# Patient Record
Sex: Male | Born: 1935 | Race: Black or African American | Hispanic: No | Marital: Married | Smoking: Former smoker
Health system: Southern US, Community
[De-identification: ages and names within clinical notes are randomized; demographics above are authoritative.]

## PROBLEM LIST (undated history)

## (undated) DIAGNOSIS — E785 Hyperlipidemia, unspecified: Secondary | ICD-10-CM

## (undated) DIAGNOSIS — M1990 Unspecified osteoarthritis, unspecified site: Secondary | ICD-10-CM

## (undated) DIAGNOSIS — E119 Type 2 diabetes mellitus without complications: Secondary | ICD-10-CM

## (undated) DIAGNOSIS — N183 Chronic kidney disease, stage 3 unspecified: Secondary | ICD-10-CM

## (undated) DIAGNOSIS — I10 Essential (primary) hypertension: Secondary | ICD-10-CM

## (undated) DIAGNOSIS — H409 Unspecified glaucoma: Secondary | ICD-10-CM

## (undated) DIAGNOSIS — N184 Chronic kidney disease, stage 4 (severe): Secondary | ICD-10-CM

## (undated) DIAGNOSIS — G459 Transient cerebral ischemic attack, unspecified: Secondary | ICD-10-CM

## (undated) HISTORY — DX: Unspecified osteoarthritis, unspecified site: M19.90

## (undated) HISTORY — DX: Hyperlipidemia, unspecified: E78.5

## (undated) HISTORY — DX: Unspecified glaucoma: H40.9

---

## 1997-06-05 ENCOUNTER — Encounter: Admission: RE | Admit: 1997-06-05 | Discharge: 1997-06-05 | Payer: Self-pay | Admitting: Family Medicine

## 1997-07-08 ENCOUNTER — Encounter: Admission: RE | Admit: 1997-07-08 | Discharge: 1997-07-08 | Payer: Self-pay | Admitting: Family Medicine

## 1997-08-14 ENCOUNTER — Encounter: Admission: RE | Admit: 1997-08-14 | Discharge: 1997-08-14 | Payer: Self-pay | Admitting: Family Medicine

## 1997-08-21 ENCOUNTER — Encounter: Admission: RE | Admit: 1997-08-21 | Discharge: 1997-08-21 | Payer: Self-pay | Admitting: Family Medicine

## 1997-09-18 ENCOUNTER — Encounter: Admission: RE | Admit: 1997-09-18 | Discharge: 1997-09-18 | Payer: Self-pay | Admitting: Family Medicine

## 1997-09-21 ENCOUNTER — Encounter: Admission: RE | Admit: 1997-09-21 | Discharge: 1997-09-21 | Payer: Self-pay | Admitting: Family Medicine

## 1998-06-01 ENCOUNTER — Encounter: Admission: RE | Admit: 1998-06-01 | Discharge: 1998-06-01 | Payer: Self-pay | Admitting: Sports Medicine

## 1998-08-06 ENCOUNTER — Encounter: Admission: RE | Admit: 1998-08-06 | Discharge: 1998-08-06 | Payer: Self-pay | Admitting: Family Medicine

## 1998-12-27 ENCOUNTER — Encounter: Admission: RE | Admit: 1998-12-27 | Discharge: 1998-12-27 | Payer: Self-pay | Admitting: Family Medicine

## 1999-04-18 ENCOUNTER — Encounter: Admission: RE | Admit: 1999-04-18 | Discharge: 1999-04-18 | Payer: Self-pay | Admitting: Family Medicine

## 1999-05-23 ENCOUNTER — Encounter: Admission: RE | Admit: 1999-05-23 | Discharge: 1999-05-23 | Payer: Self-pay | Admitting: Family Medicine

## 1999-08-31 ENCOUNTER — Encounter: Admission: RE | Admit: 1999-08-31 | Discharge: 1999-08-31 | Payer: Self-pay | Admitting: Family Medicine

## 1999-12-29 ENCOUNTER — Encounter: Admission: RE | Admit: 1999-12-29 | Discharge: 1999-12-29 | Payer: Self-pay | Admitting: Family Medicine

## 2000-03-19 ENCOUNTER — Encounter: Admission: RE | Admit: 2000-03-19 | Discharge: 2000-03-19 | Payer: Self-pay | Admitting: Family Medicine

## 2000-06-11 ENCOUNTER — Encounter: Admission: RE | Admit: 2000-06-11 | Discharge: 2000-06-11 | Payer: Self-pay | Admitting: Family Medicine

## 2000-11-12 ENCOUNTER — Encounter: Admission: RE | Admit: 2000-11-12 | Discharge: 2000-11-12 | Payer: Self-pay | Admitting: Family Medicine

## 2001-02-18 ENCOUNTER — Encounter: Admission: RE | Admit: 2001-02-18 | Discharge: 2001-02-18 | Payer: Self-pay | Admitting: Family Medicine

## 2001-06-07 ENCOUNTER — Encounter: Admission: RE | Admit: 2001-06-07 | Discharge: 2001-06-07 | Payer: Self-pay | Admitting: Family Medicine

## 2001-06-17 ENCOUNTER — Encounter: Admission: RE | Admit: 2001-06-17 | Discharge: 2001-06-17 | Payer: Self-pay | Admitting: Family Medicine

## 2001-06-21 ENCOUNTER — Encounter: Admission: RE | Admit: 2001-06-21 | Discharge: 2001-09-19 | Payer: Self-pay | Admitting: *Deleted

## 2001-06-24 ENCOUNTER — Encounter: Admission: RE | Admit: 2001-06-24 | Discharge: 2001-06-24 | Payer: Self-pay | Admitting: Family Medicine

## 2001-07-09 ENCOUNTER — Encounter: Admission: RE | Admit: 2001-07-09 | Discharge: 2001-07-09 | Payer: Self-pay | Admitting: Family Medicine

## 2006-03-08 DIAGNOSIS — I10 Essential (primary) hypertension: Secondary | ICD-10-CM

## 2006-03-08 DIAGNOSIS — E119 Type 2 diabetes mellitus without complications: Secondary | ICD-10-CM | POA: Insufficient documentation

## 2006-03-08 DIAGNOSIS — E785 Hyperlipidemia, unspecified: Secondary | ICD-10-CM | POA: Insufficient documentation

## 2006-03-08 DIAGNOSIS — R809 Proteinuria, unspecified: Secondary | ICD-10-CM

## 2006-03-08 DIAGNOSIS — N529 Male erectile dysfunction, unspecified: Secondary | ICD-10-CM

## 2007-01-26 ENCOUNTER — Emergency Department (HOSPITAL_COMMUNITY): Admission: EM | Admit: 2007-01-26 | Discharge: 2007-01-26 | Payer: Self-pay | Admitting: Emergency Medicine

## 2010-09-30 LAB — I-STAT 8, (EC8 V) (CONVERTED LAB)
BUN: 24 — ABNORMAL HIGH
Bicarbonate: 25.9 — ABNORMAL HIGH
Chloride: 103
Glucose, Bld: 159 — ABNORMAL HIGH
Glucose, Bld: 365 — ABNORMAL HIGH
HCT: 39
HCT: 43
Hemoglobin: 14.6
Operator id: 294511
Potassium: 3.6
Potassium: 4.3
Sodium: 136
Sodium: 140
TCO2: 27

## 2012-06-02 ENCOUNTER — Encounter (HOSPITAL_COMMUNITY): Payer: Self-pay | Admitting: *Deleted

## 2012-06-02 ENCOUNTER — Emergency Department (HOSPITAL_COMMUNITY)
Admission: EM | Admit: 2012-06-02 | Discharge: 2012-06-02 | Disposition: A | Discharge status: Home / Self Care | Payer: Medicare Other | Attending: Emergency Medicine | Admitting: Emergency Medicine

## 2012-06-02 ENCOUNTER — Emergency Department (HOSPITAL_COMMUNITY): Payer: Medicare Other

## 2012-06-02 DIAGNOSIS — M19019 Primary osteoarthritis, unspecified shoulder: Secondary | ICD-10-CM | POA: Insufficient documentation

## 2012-06-02 DIAGNOSIS — M1990 Unspecified osteoarthritis, unspecified site: Secondary | ICD-10-CM

## 2012-06-02 DIAGNOSIS — N289 Disorder of kidney and ureter, unspecified: Secondary | ICD-10-CM | POA: Insufficient documentation

## 2012-06-02 DIAGNOSIS — M542 Cervicalgia: Secondary | ICD-10-CM | POA: Insufficient documentation

## 2012-06-02 DIAGNOSIS — E119 Type 2 diabetes mellitus without complications: Secondary | ICD-10-CM | POA: Insufficient documentation

## 2012-06-02 DIAGNOSIS — Z79899 Other long term (current) drug therapy: Secondary | ICD-10-CM | POA: Insufficient documentation

## 2012-06-02 HISTORY — DX: Type 2 diabetes mellitus without complications: E11.9

## 2012-06-02 LAB — POCT I-STAT, CHEM 8
BUN: 29 mg/dL — ABNORMAL HIGH (ref 6–23)
Calcium, Ion: 1.15 mmol/L (ref 1.13–1.30)
Creatinine, Ser: 1.7 mg/dL — ABNORMAL HIGH (ref 0.50–1.35)
Hemoglobin: 11.9 g/dL — ABNORMAL LOW (ref 13.0–17.0)
Sodium: 140 mEq/L (ref 135–145)
TCO2: 25 mmol/L (ref 0–100)

## 2012-06-02 LAB — POCT I-STAT TROPONIN I: Troponin i, poc: 0.01 ng/mL (ref 0.00–0.08)

## 2012-06-02 MED ORDER — TRAMADOL HCL 50 MG PO TABS: 50.0000 mg | ORAL_TABLET | Freq: Four times a day (QID) | ORAL | Status: DC | PRN

## 2012-06-02 NOTE — ED Provider Notes (Signed)
History     CSN: [id number]  Arrival date & time 06/02/12  [id number]   First MD Initiated Contact with Patient 06/02/12 1116      Chief Complaint  Patient presents with  . Shoulder Pain  . Neck Pain     HPI Pt reports right neck tenderness and right shoulder pain for several days, became more severe last night and it kept him up all night. Denies injury to shoulder.  Pain is been present for several weeks has gotten slowly worse.  Patient had trouble sleeping last night because the pain.  Patient did take 2 Aleve prior to going to bed last.  Certain movements make the pain worse.  No known direct trauma.  Patient denies shortness of breath, chest pain, diaphoresis, nausea, vomiting.  Past Medical History  Diagnosis Date  . Diabetes mellitus without complication     History reviewed. No pertinent past surgical history.  History reviewed. No pertinent family history.  History  Substance Use Topics  . Smoking status: Not on file  . Smokeless tobacco: Not on file  . Alcohol Use: No      Review of Systems All other systems reviewed and are negative Allergies  Review of patient's allergies indicates no known allergies.  Home Medications   Current Outpatient Rx  Name  Route  Sig  Dispense  Refill  . glimepiride (AMARYL) 4 MG tablet   Oral   Take 4 mg by mouth daily before breakfast.         . losartan-hydrochlorothiazide (HYZAAR) 100-12.5 MG per tablet   Oral   Take 1 tablet by mouth daily.         . metFORMIN (GLUCOPHAGE) 1000 MG tablet   Oral   Take 1,000 mg by mouth 2 (two) times daily with a meal.         . pravastatin (PRAVACHOL) 40 MG tablet   Oral   Take 40 mg by mouth daily.         . traMADol (ULTRAM) 50 MG tablet   Oral   Take 1 tablet (50 mg total) by mouth every 6 (six) hours as needed for pain.   30 tablet   0     BP 158/79  Pulse 93  Temp(Src) 98.1 F (36.7 C) (Oral)  Resp 19  SpO2 100%  Physical Exam  Nursing note and vitals  reviewed. Constitutional: He is oriented to person, place, and time. He appears well-developed and well-nourished. No distress.  HENT:  Head: Normocephalic and atraumatic.  Eyes: Pupils are equal, round, and reactive to light.  Neck: Normal range of motion.  Cardiovascular: Normal rate and intact distal pulses.   Pulmonary/Chest: No respiratory distress.  Abdominal: Normal appearance. He exhibits no distension.  Musculoskeletal:       Right shoulder: He exhibits tenderness. He exhibits normal range of motion, no bony tenderness, no swelling, no effusion and no crepitus.       Arms: Neurological: He is alert and oriented to person, place, and time. No cranial nerve deficit.  Skin: Skin is warm and dry. No rash noted.  Psychiatric: He has a normal mood and affect. His behavior is normal.    ED Course  Procedures (including critical care time)  Date: 06/02/2012  Rate: 93  Rhythm: normal sinus rhythm  QRS Axis: normal  Intervals: normal  ST/T Wave abnormalities: Nonspecific changes  Conduction Disutrbances: none  Narrative Interpretation: Nonspecific EKG     Labs Reviewed  POCT I-STAT, CHEM 8 -  Abnormal; Notable for the following:    BUN 29 (*)    Creatinine, Ser 1.70 (*)    Glucose, Bld 219 (*)    Hemoglobin 11.9 (*)    HCT 35.0 (*)    All other components within normal limits  POCT I-STAT TROPONIN I   Dg Shoulder Right  06/02/2012   *RADIOLOGY REPORT*  Clinical Data: Right shoulder pain.  No reported injury.  RIGHT SHOULDER - 2+ VIEW  Comparison: None.  Findings: Mild inferior glenohumeral spur formation.  No fracture or dislocation.  IMPRESSION: Mild degenerative changes.   Original Report Authenticated By: Claudie Revering, M.D.     1. Degenerative joint disease   2. Renal insufficiency   3. Diabetes       MDM   Patient was instructed to avoid NSAIDs until cleared by his primary care Dr.     Dot Lanes, MD 06/02/12 1239

## 2012-06-02 NOTE — ED Notes (Addendum)
Pain states pain inc with "certain movement." Pt currently has full ROM at this time. Pt states pain has been going on for a couple weeks and has progressed over time.

## 2012-06-02 NOTE — ED Notes (Signed)
Pt reports right neck tenderness and right shoulder pain for several days, became more severe last night and it kept him up all night. Denies injury to shoulder. No distress noted at triage, ekg being done.

## 2012-06-11 ENCOUNTER — Emergency Department (HOSPITAL_COMMUNITY)
Admission: EM | Admit: 2012-06-11 | Discharge: 2012-06-11 | Disposition: A | Discharge status: Home / Self Care | Payer: Medicare Other | Attending: Emergency Medicine | Admitting: Emergency Medicine

## 2012-06-11 ENCOUNTER — Encounter (HOSPITAL_COMMUNITY): Payer: Self-pay | Admitting: Emergency Medicine

## 2012-06-11 ENCOUNTER — Emergency Department (HOSPITAL_COMMUNITY): Payer: Medicare Other

## 2012-06-11 DIAGNOSIS — Z79899 Other long term (current) drug therapy: Secondary | ICD-10-CM | POA: Insufficient documentation

## 2012-06-11 DIAGNOSIS — R002 Palpitations: Secondary | ICD-10-CM | POA: Insufficient documentation

## 2012-06-11 DIAGNOSIS — I10 Essential (primary) hypertension: Secondary | ICD-10-CM

## 2012-06-11 DIAGNOSIS — R3589 Other polyuria: Secondary | ICD-10-CM | POA: Insufficient documentation

## 2012-06-11 DIAGNOSIS — R358 Other polyuria: Secondary | ICD-10-CM

## 2012-06-11 DIAGNOSIS — E119 Type 2 diabetes mellitus without complications: Secondary | ICD-10-CM | POA: Insufficient documentation

## 2012-06-11 DIAGNOSIS — R42 Dizziness and giddiness: Secondary | ICD-10-CM | POA: Insufficient documentation

## 2012-06-11 HISTORY — DX: Essential (primary) hypertension: I10

## 2012-06-11 LAB — BASIC METABOLIC PANEL
BUN: 25 mg/dL — ABNORMAL HIGH (ref 6–23)
CO2: 20 mEq/L (ref 19–32)
Calcium: 9.3 mg/dL (ref 8.4–10.5)
Chloride: 106 mEq/L (ref 96–112)
Creatinine, Ser: 1.75 mg/dL — ABNORMAL HIGH (ref 0.50–1.35)

## 2012-06-11 LAB — CBC WITH DIFFERENTIAL/PLATELET
Basophils Relative: 0 % (ref 0–1)
Eosinophils Absolute: 0.1 10*3/uL (ref 0.0–0.7)
MCH: 29.8 pg (ref 26.0–34.0)
MCHC: 34.7 g/dL (ref 30.0–36.0)
MCV: 86 fL (ref 78.0–100.0)
Monocytes Absolute: 0.6 10*3/uL (ref 0.1–1.0)
Monocytes Relative: 8 % (ref 3–12)
Neutro Abs: 4.2 10*3/uL (ref 1.7–7.7)
RBC: 4.06 MIL/uL — ABNORMAL LOW (ref 4.22–5.81)
WBC: 7 10*3/uL (ref 4.0–10.5)

## 2012-06-11 LAB — URINALYSIS, ROUTINE W REFLEX MICROSCOPIC
Bilirubin Urine: NEGATIVE
Glucose, UA: NEGATIVE mg/dL
Hgb urine dipstick: NEGATIVE
Specific Gravity, Urine: 1.019 (ref 1.005–1.030)

## 2012-06-11 LAB — URINE MICROSCOPIC-ADD ON

## 2012-06-11 MED ORDER — NITROGLYCERIN 0.4 MG SL SUBL: 0.4000 mg | SUBLINGUAL_TABLET | SUBLINGUAL | Status: DC | PRN

## 2012-06-11 MED ORDER — ASPIRIN 325 MG PO TABS
325.0000 mg | ORAL_TABLET | ORAL | Status: AC
  Administered 2012-06-11: 325 mg via ORAL
  Filled 2012-06-11: qty 1

## 2012-06-11 NOTE — ED Notes (Signed)
Called laboratory and was told a lavender top had never been sent down and needs a new lavender tube to be sent down.

## 2012-06-11 NOTE — ED Notes (Signed)
Called phlebotomy and notified them that CBC needed to be redrawn. Phlebotomy states they have the tube and need an order to be sent down with tube. Dr Christy Gentles notified of situation and new order has been placed

## 2012-06-11 NOTE — ED Provider Notes (Signed)
History     CSN: [id number]  Arrival date & time 06/11/12  99   First MD Initiated Contact with Patient 06/11/12 1548      Chief Complaint  Patient presents with  . Palpitations  . Dizziness     HPI Dizziness Onset - several weeks ago Course - stable Worsened by - walking Improved with rest   Pt reports he ate barbecue yesterday This morning when he woke up he felt "off balance" though he admits he has had this for weeks He reports he feels his heart racing and felt dizzy this morning.  No falls No syncope No CP No abd pain No vomiting/diarrhea No focal weakness No vertiginous symptoms  No HA   Past Medical History  Diagnosis Date  . Diabetes mellitus without complication   . Hypertension     History reviewed. No pertinent past surgical history.  History reviewed. No pertinent family history.  History  Substance Use Topics  . Smoking status: Not on file  . Smokeless tobacco: Not on file  . Alcohol Use: No      Review of Systems  Constitutional: Negative for fever.  Respiratory: Negative for shortness of breath.   Cardiovascular: Negative for chest pain.  Gastrointestinal: Negative for vomiting.  Neurological: Positive for dizziness. Negative for headaches.  All other systems reviewed and are negative.    Allergies  Review of patient's allergies indicates no known allergies.  Home Medications   Current Outpatient Rx  Name  Route  Sig  Dispense  Refill  . glimepiride (AMARYL) 4 MG tablet   Oral   Take 4 mg by mouth daily before breakfast.         . losartan-hydrochlorothiazide (HYZAAR) 100-12.5 MG per tablet   Oral   Take 1 tablet by mouth daily.         . metFORMIN (GLUCOPHAGE) 1000 MG tablet   Oral   Take 1,000 mg by mouth 2 (two) times daily with a meal.         . pravastatin (PRAVACHOL) 40 MG tablet   Oral   Take 40 mg by mouth daily.         . traMADol (ULTRAM) 50 MG tablet   Oral   Take 1 tablet (50 mg total) by  mouth every 6 (six) hours as needed for pain.   30 tablet   0     BP 163/83  Pulse 123  Temp(Src) 98.3 F (36.8 C) (Oral)  Resp 18  SpO2 99% BP 182/97  Pulse 118  Temp(Src) 98.5 F (36.9 C) (Oral)  Resp 18  SpO2 98%  Physical Exam CONSTITUTIONAL: Well developed/well nourished HEAD: Normocephalic/atraumatic EYES: EOMI/PERRL ENMT: Mucous membranes moist NECK: supple no meningeal signs SPINE:entire spine nontender CV: tachycardic S1/S2 noted, no murmurs/rubs/gallops noted LUNGS: Lungs are clear to auscultation bilaterally, no apparent distress ABDOMEN: soft, nontender, no rebound or guarding GU:no cva tenderness NEURO: Pt is awake/alert, moves all extremitiesx4 No facial droop No arm/leg drift No ataxia noted Steady gait noted No tremor noted EXTREMITIES: pulses normal, full ROM SKIN: warm, color normal PSYCH: no abnormalities of mood noted  ED Course  Procedures  Labs Reviewed  BASIC METABOLIC PANEL - Abnormal; Notable for the following:    Glucose, Bld 140 (*)    BUN 25 (*)    Creatinine, Ser 1.75 (*)    GFR calc non Af Amer 35 (*)    GFR calc Af Amer 41 (*)    All other components within normal  limits  CBC  POCT I-STAT TROPONIN I  6:35 PM Pt observed for several hrs.  He is well appearing.  No ataxia.  No focal weakness He ambulated without difficulty He denies cp/sob/abdominal pain He does report polyuria but reports his PCP told him his prostate is "fine"  He reports he gets up frequently with urinating No signs of uti, but may need flomax He may also need better BP control - his PCP cut back his hyzaar recently and his BP is still elevated.  He will see his PCP this week and I have asked him to record daily BP reading to take back to his PCP  He is well appearing, watching TV, no distress I doubt ACS/CVA or other acute emergent process.  No signs of hypertensive emergency His tachycardia has improved as well All questions answered, patient/family feel  comfortable with plan   MDM  Nursing notes including past medical history and social history reviewed and considered in documentation xrays reviewed and considered Labs/vital reviewed and considered Previous records reviewed and considered - recent ED visit reviewed        Date: 06/11/2012  Rate: 122  Rhythm: sinus tachycardia  QRS Axis: normal  Intervals: normal  ST/T Wave abnormalities: nonspecific ST changes  Conduction Disutrbances:none  Narrative Interpretation:   Old EKG Reviewed: changes noted as rate is faster today    Sharyon Cable, MD 06/11/12 [phone]

## 2012-06-11 NOTE — ED Notes (Signed)
Pt reports feeling off balance for the last several weeks. Today upon waking felt dizzy was diaphoretic and felt like his heart was racing. Denies chest pain. Pt alert, oriented x4. NAD

## 2013-03-24 ENCOUNTER — Encounter (INDEPENDENT_AMBULATORY_CARE_PROVIDER_SITE_OTHER): Payer: Self-pay | Admitting: General Surgery

## 2013-03-27 ENCOUNTER — Ambulatory Visit (INDEPENDENT_AMBULATORY_CARE_PROVIDER_SITE_OTHER): Payer: Medicare Other | Admitting: General Surgery

## 2013-04-01 ENCOUNTER — Encounter (INDEPENDENT_AMBULATORY_CARE_PROVIDER_SITE_OTHER): Payer: Self-pay | Admitting: General Surgery

## 2013-05-14 ENCOUNTER — Ambulatory Visit (INDEPENDENT_AMBULATORY_CARE_PROVIDER_SITE_OTHER): Payer: Medicare Other | Admitting: General Surgery

## 2013-05-15 ENCOUNTER — Encounter (INDEPENDENT_AMBULATORY_CARE_PROVIDER_SITE_OTHER): Payer: Self-pay | Admitting: General Surgery

## 2013-05-15 ENCOUNTER — Ambulatory Visit (INDEPENDENT_AMBULATORY_CARE_PROVIDER_SITE_OTHER): Payer: Medicare Other | Admitting: General Surgery

## 2013-05-15 VITALS — BP 130/80 | HR 88 | Temp 97.3°F | Resp 14 | Ht 65.0 in | Wt 213.4 lb

## 2013-05-15 DIAGNOSIS — K4090 Unilateral inguinal hernia, without obstruction or gangrene, not specified as recurrent: Secondary | ICD-10-CM | POA: Insufficient documentation

## 2013-05-15 NOTE — Progress Notes (Signed)
Patient ID: Justin Holloway, male   DOB: 26-Sep-1929, 80 y.o.   MRN: [id number]  Chief Complaint  Patient presents with  . New Evaluation    eval LIH    HPI Justin Holloway is a 80 y.o. male.  He is referred by Dr. Christia Holloway evaluation of left internal hernia. Dr. Heath Holloway is his PCP.  The patient states that he has had a swelling in his left groin since he was a young man. It has been getting larger this last year and has extended well down into the scrotum. No prior history of any hernia surgery. He says he has no pain. His wife is with him and agrees that has gotten much larger.  No history of any prior surgery. Non-insulin-dependent diabetes. Hypertension. Hyperlipidemia.  HPI  Past Medical History  Diagnosis Date  . Diabetes mellitus without complication   . Hypertension   . Arthritis   . Hyperlipidemia     History reviewed. No pertinent past surgical history.  Family History  Problem Relation Age of Onset  . Cancer Mother 70    breast  . COPD Father 95    Congestive heart failure    Social History History  Substance Use Topics  . Smoking status: Never Smoker   . Smokeless tobacco: Not on file  . Alcohol Use: No    No Known Allergies  Current Outpatient Prescriptions  Medication Sig Dispense Refill  . Albiglutide (TANZEUM) 30 MG PEN Inject into the skin.      [name] glimepiride (AMARYL) 4 MG tablet Take 4 mg by mouth daily before breakfast.      . losartan-hydrochlorothiazide (HYZAAR) 100-12.5 MG per tablet Take 1 tablet by mouth daily.      . metFORMIN (GLUCOPHAGE) 1000 MG tablet Take 1,000 mg by mouth 2 (two) times daily with a meal.      . pravastatin (PRAVACHOL) 40 MG tablet Take 40 mg by mouth daily.      . traMADol (ULTRAM) 50 MG tablet Take 1 tablet (50 mg total) by mouth every 6 (six) hours as needed for pain.  30 tablet  0   No current facility-administered medications for this visit.    Review of Systems Review of Systems  Constitutional:  Negative for fever, chills and unexpected weight change.  HENT: Negative for congestion, hearing loss, sore throat, trouble swallowing and voice change.   Eyes: Negative for visual disturbance.  Respiratory: Negative for cough and wheezing.   Cardiovascular: Negative for chest pain, palpitations and leg swelling.  Gastrointestinal: Negative for nausea, vomiting, abdominal pain, diarrhea, constipation, blood in stool, abdominal distention, anal bleeding and rectal pain.  Genitourinary: Positive for scrotal swelling. Negative for hematuria and difficulty urinating.  Musculoskeletal: Negative for arthralgias.  Skin: Negative for rash and wound.  Neurological: Negative for seizures, syncope, weakness and headaches.  Hematological: Negative for adenopathy. Does not bruise/bleed easily.  Psychiatric/Behavioral: Negative for confusion.    Blood pressure 130/80, pulse 88, temperature 97.3 F (36.3 C), resp. rate 14, height 5\' 5"  (1.651 m), weight 213 lb 6.4 oz (96.798 kg).  Physical Exam Physical Exam  Constitutional: He is oriented to person, place, and time. He appears well-developed and well-nourished. No distress.  HENT:  Head: Normocephalic.  Nose: Nose normal.  Mouth/Throat: No oropharyngeal exudate.  Eyes: Conjunctivae and EOM are normal. Pupils are equal, round, and reactive to light. Right eye exhibits no discharge. Left eye exhibits no discharge. No scleral icterus.  Neck: Normal range of motion. Neck  supple. No JVD present. No tracheal deviation present. No thyromegaly present.  Cardiovascular: Normal rate, regular rhythm, normal heart sounds and intact distal pulses.   No murmur heard. Pulmonary/Chest: Effort normal and breath sounds normal. No stridor. No respiratory distress. He has no wheezes. He has no rales. He exhibits no tenderness.  Abdominal: Soft. Bowel sounds are normal. He exhibits no distension and no mass. There is no tenderness. There is no rebound and no guarding.   Umbilical hernia, easily reducible, nontender  Genitourinary:  Very large left inguinal hernia extending down into the scrotum. I was able to reduce this when he is supine but it took some time and some effort. No evidence of hernia on the right.  Musculoskeletal: Normal range of motion. He exhibits no edema and no tenderness.  Lymphadenopathy:    He has no cervical adenopathy.  Neurological: He is alert and oriented to person, place, and time. He has normal reflexes. Coordination normal.  Skin: Skin is warm and dry. No rash noted. He is not diaphoretic. No erythema. No pallor.  Psychiatric: He has a normal mood and affect. His behavior is normal. Judgment and thought content normal.    Data Reviewed Dr. Cy Holloway office notes  Assessment    Large, long-standing indirect left inguinal hernia. Minimally symptomatic but enlarging and at risk for incarceration and strangulation  Diabetes mellitus  Hypertension  Hyperlipidemia  Stable health status was good performance status     Plan    I discussed the anatomy and natural history of his hernia. I offered to repair this electively and he wishes to have that done  We will reschedule for elective repair of his very large left inguinal hernia with mesh in the near future  I discussed the indications, details, techniques, and numerous risks of the surgery with him and his wife.  He's aware of the risk of bleeding, infection, recurrence, nerve damage with chronic pain, injury to the bladder or intestine which is rare, and other unforeseen problems. He understands all these issues and all his questions were answered. He agrees with this plan.        Justin Holloway 05/15/2013, 12:38 PM

## 2013-05-15 NOTE — Patient Instructions (Signed)
You have a very large left inguinal hernia. Fortunately, it is reducible with some effort.  We have talked about the natural history of this in the future and the risks and complications that might occur.  You had decided to go ahead with surgery.  You will be scheduled for open repair of the Left inguinal hernia with mesh in the near future.    Inguinal Hernia, Adult Muscles help keep everything in the body in its proper place. But if a weak spot in the muscles develops, something can poke through. That is called a hernia. When this happens in the lower part of the belly (abdomen), it is called an inguinal hernia. (It takes its name from a part of the body in this region called the inguinal canal.) A weak spot in the wall of muscles lets some fat or part of the small intestine bulge through. An inguinal hernia can develop at any age. Men get them more often than women. CAUSES  In adults, an inguinal hernia develops over time.  It can be triggered by:  Suddenly straining the muscles of the lower abdomen.  Lifting heavy objects.  Straining to have a bowel movement. Difficult bowel movements (constipation) can lead to this.  Constant coughing. This may be caused by smoking or lung disease.  Being overweight.  Being pregnant.  Working at a job that requires long periods of standing or heavy lifting.  Having had an inguinal hernia before. One type can be an emergency situation. It is called a strangulated inguinal hernia. It develops if part of the small intestine slips through the weak spot and cannot get back into the abdomen. The blood supply can be cut off. If that happens, part of the intestine may die. This situation requires emergency surgery. SYMPTOMS  Often, a small inguinal hernia has no symptoms. It is found when a healthcare provider does a physical exam. Larger hernias usually have symptoms.   In adults, symptoms may include:  A lump in the groin. This is easier to see  when the person is standing. It might disappear when lying down.  In men, a lump in the scrotum.  Pain or burning in the groin. This occurs especially when lifting, straining or coughing.  A dull ache or feeling of pressure in the groin.  Signs of a strangulated hernia can include:  A bulge in the groin that becomes very painful and tender to the touch.  A bulge that turns red or purple.  Fever, nausea and vomiting.  Inability to have a bowel movement or to pass gas. DIAGNOSIS  To decide if you have an inguinal hernia, a healthcare provider will probably do a physical examination.  This will include asking questions about any symptoms you have noticed.  The healthcare provider might feel the groin area and ask you to cough. If an inguinal hernia is felt, the healthcare provider may try to slide it back into the abdomen.  Usually no other tests are needed. TREATMENT  Treatments can vary. The size of the hernia makes a difference. Options include:  Watchful waiting. This is often suggested if the hernia is small and you have had no symptoms.  No medical procedure will be done unless symptoms develop.  You will need to watch closely for symptoms. If any occur, contact your healthcare provider right away.  Surgery. This is used if the hernia is larger or you have symptoms.  Open surgery. This is usually an outpatient procedure (you will not stay  overnight in a hospital). An cut (incision) is made through the skin in the groin. The hernia is put back inside the abdomen. The weak area in the muscles is then repaired by herniorrhaphy or hernioplasty. Herniorrhaphy: in this type of surgery, the weak muscles are sewn back together. Hernioplasty: a patch or mesh is used to close the weak area in the abdominal wall.  Laparoscopy. In this procedure, a surgeon makes small incisions. A thin tube with a tiny video camera (called a laparoscope) is put into the abdomen. The surgeon repairs the  hernia with mesh by looking with the video camera and using two long instruments. HOME CARE INSTRUCTIONS   After surgery to repair an inguinal hernia:  You will need to take pain medicine prescribed by your healthcare provider. Follow all directions carefully.  You will need to take care of the wound from the incision.  Your activity will be restricted for awhile. This will probably include no heavy lifting for several weeks. You also should not do anything too active for a few weeks. When you can return to work will depend on the type of job that you have.  During "watchful waiting" periods, you should:  Maintain a healthy weight.  Eat a diet high in fiber (fruits, vegetables and whole grains).  Drink plenty of fluids to avoid constipation. This means drinking enough water and other liquids to keep your urine clear or pale yellow.  Do not lift heavy objects.  Do not stand for long periods of time.  Quit smoking. This should keep you from developing a frequent cough. SEEK MEDICAL CARE IF:   A bulge develops in your groin area.  You feel pain, a burning sensation or pressure in the groin. This might be worse if you are lifting or straining.  You develop a fever of more than 100.5 F (38.1 C). SEEK IMMEDIATE MEDICAL CARE IF:   Pain in the groin increases suddenly.  A bulge in the groin gets bigger suddenly and does not go down.  For men, there is sudden pain in the scrotum. Or, the size of the scrotum increases.  A bulge in the groin area becomes red or purple and is painful to touch.  You have nausea or vomiting that does not go away.  You feel your heart beating much faster than normal.  You cannot have a bowel movement or pass gas.  You develop a fever of more than 102.0 F (38.9 C). Document Released: 05/14/2008 Document Revised: 03/20/2011 Document Reviewed: 05/14/2008 [facility] Patient Information 2014 [place], [place].

## 2013-05-29 ENCOUNTER — Encounter (HOSPITAL_COMMUNITY): Payer: Self-pay | Admitting: Pharmacy Technician

## 2013-06-04 ENCOUNTER — Encounter (HOSPITAL_COMMUNITY)
Admission: RE | Admit: 2013-06-04 | Discharge: 2013-06-04 | Disposition: A | Discharge status: Home / Self Care | Payer: Medicare Other | Source: Ambulatory Visit | Attending: General Surgery | Admitting: General Surgery

## 2013-06-04 ENCOUNTER — Encounter (HOSPITAL_COMMUNITY): Payer: Self-pay

## 2013-06-04 DIAGNOSIS — E119 Type 2 diabetes mellitus without complications: Secondary | ICD-10-CM | POA: Insufficient documentation

## 2013-06-04 DIAGNOSIS — Z01812 Encounter for preprocedural laboratory examination: Secondary | ICD-10-CM | POA: Insufficient documentation

## 2013-06-04 DIAGNOSIS — I10 Essential (primary) hypertension: Secondary | ICD-10-CM | POA: Insufficient documentation

## 2013-06-04 DIAGNOSIS — Z01810 Encounter for preprocedural cardiovascular examination: Secondary | ICD-10-CM | POA: Insufficient documentation

## 2013-06-04 DIAGNOSIS — Z01818 Encounter for other preprocedural examination: Secondary | ICD-10-CM | POA: Insufficient documentation

## 2013-06-04 LAB — URINALYSIS, ROUTINE W REFLEX MICROSCOPIC
BILIRUBIN URINE: NEGATIVE
Glucose, UA: NEGATIVE mg/dL
HGB URINE DIPSTICK: NEGATIVE
KETONES UR: NEGATIVE mg/dL
Nitrite: NEGATIVE
PROTEIN: NEGATIVE mg/dL
Specific Gravity, Urine: 1.019 (ref 1.005–1.030)
UROBILINOGEN UA: 0.2 mg/dL (ref 0.0–1.0)
pH: 5 (ref 5.0–8.0)

## 2013-06-04 LAB — COMPREHENSIVE METABOLIC PANEL
ALT: 10 U/L (ref 0–53)
AST: 16 U/L (ref 0–37)
Albumin: 3.8 g/dL (ref 3.5–5.2)
Alkaline Phosphatase: 81 U/L (ref 39–117)
BILIRUBIN TOTAL: 0.3 mg/dL (ref 0.3–1.2)
BUN: 22 mg/dL (ref 6–23)
CHLORIDE: 107 meq/L (ref 96–112)
CO2: 23 meq/L (ref 19–32)
Calcium: 10.2 mg/dL (ref 8.4–10.5)
Creatinine, Ser: 1.66 mg/dL — ABNORMAL HIGH (ref 0.50–1.35)
GFR, EST AFRICAN AMERICAN: 43 mL/min — AB (ref >90)
GFR, EST NON AFRICAN AMERICAN: 37 mL/min — AB (ref >90)
GLUCOSE: 151 mg/dL — AB (ref 70–99)
Potassium: 5 mEq/L (ref 3.7–5.3)
SODIUM: 143 meq/L (ref 137–147)
Total Protein: 7.5 g/dL (ref 6.0–8.3)

## 2013-06-04 LAB — CBC WITH DIFFERENTIAL/PLATELET
BASOS ABS: 0 10*3/uL (ref 0.0–0.1)
Basophils Relative: 0 % (ref 0–1)
Eosinophils Absolute: 0.2 10*3/uL (ref 0.0–0.7)
Eosinophils Relative: 3 % (ref 0–5)
HEMATOCRIT: 37 % — AB (ref 39.0–52.0)
Hemoglobin: 12 g/dL — ABNORMAL LOW (ref 13.0–17.0)
LYMPHS ABS: 2 10*3/uL (ref 0.7–4.0)
LYMPHS PCT: 34 % (ref 12–46)
MCH: 29 pg (ref 26.0–34.0)
MCHC: 32.4 g/dL (ref 30.0–36.0)
MCV: 89.4 fL (ref 78.0–100.0)
MONO ABS: 0.5 10*3/uL (ref 0.1–1.0)
Monocytes Relative: 8 % (ref 3–12)
NEUTROS ABS: 3.2 10*3/uL (ref 1.7–7.7)
Neutrophils Relative %: 55 % (ref 43–77)
PLATELETS: 204 10*3/uL (ref 150–400)
RBC: 4.14 MIL/uL — AB (ref 4.22–5.81)
RDW: 13.1 % (ref 11.5–15.5)
WBC: 5.9 10*3/uL (ref 4.0–10.5)

## 2013-06-04 LAB — URINE MICROSCOPIC-ADD ON

## 2013-06-04 NOTE — Progress Notes (Signed)
06/04/13 1259  OBSTRUCTIVE SLEEP APNEA  Have you ever been diagnosed with sleep apnea through a sleep study? No  Do you snore loudly (loud enough to be heard through closed doors)?  1  Do you often feel tired, fatigued, or sleepy during the daytime? 1  Has anyone observed you stop breathing during your sleep? 1  Do you have, or are you being treated for high blood pressure? 1  BMI more than 35 kg/m2? 1  Age over 78 years old? 1  Neck circumference greater than 40 cm/16 inches? 1  Gender: 1  Obstructive Sleep Apnea Score 8  Score 4 or greater  Results sent to PCP

## 2013-06-04 NOTE — Pre-Procedure Instructions (Addendum)
Justin Holloway  06/04/2013   Your procedure is scheduled on:  Monday, June 1  Report to [facility] Short Stay Entrance A at 9:30 AM.  Call this number if you have problems the morning of surgery: [phone]   Remember: Discontinue asprin, coumadin, plavix, effient, and herbal medications   Do not eat food or drink liquids after midnight.   Take these medicines the morning of surgery with A SIP OF WATER: none   Do not wear jewelry, make-up or nail polish.  Do not wear lotions, powders, or perfumes. You may wear deodorant.  Do not shave 48 hours prior to surgery. Men may shave face and neck.  Do not bring valuables to the hospital.  [facility] is not responsible   for any belongings or valuables.               Contacts, dentures or bridgework may not be worn into surgery.  Leave suitcase in the car. After surgery it may be brought to your room.  For patients admitted to the hospital, discharge time is determined by your   treatment team.               Patients discharged the day of surgery will not be allowed to drive home.  Name and phone number of your driver:   Special Instructions: review preparing for surgery   Please read over the following fact sheets that you were given: Pain Booklet, Coughing and Deep Breathing and Surgical Site Infection Prevention

## 2013-06-04 NOTE — Progress Notes (Signed)
Cardiologist: Dr. Einar Gip. Will request notes/ekg/any cardiac studies.

## 2013-06-05 NOTE — H&P (Signed)
Justin Holloway   MRN:  [id number]   Description: 80 year old male  Provider: [name], MD  Department: [facility]               Diagnoses      Left inguinal hernia    -  Primary      550.90            Current Vitals Most recent update: 05/15/2013 11:13 AM by Elwyn Lade, CMA      BP Pulse Temp(Src) Resp Ht Wt      130/80 88 97.3 F (36.3 C) 14 5\' 5"  (1.651 m) 213 lb 6.4 oz (96.798 kg)      BMI 35.51 kg/m2                     History and Physical     [name], MD     Status: Signed            Patient ID: Justin Holloway, male   DOB: 01-07-1930, 80 y.o.   MRN: [id number]              HPI Justin Holloway is a 80 y.o. male.  He is referred by Dr. Rana Snare for evaluation of left inguinal  hernia. Dr. Heath Gold is his PCP.   The patient states that he has had a swelling in his left groin since he was a young man. It has been getting larger this last year and has extended well down into the scrotum. No prior history of any hernia surgery. He says he has no pain. His wife is with him and agrees that has gotten much larger.   No history of any prior surgery. Non-insulin-dependent diabetes. Hypertension. Hyperlipidemia.         Past Medical History   Diagnosis  Date   .  Diabetes mellitus without complication     .  Hypertension     .  Arthritis     .  Hyperlipidemia          History reviewed. No pertinent past surgical history.    Family History   Problem  Relation  Age of Onset   .  Cancer  Mother  66       breast   .  COPD  Father  28       Congestive heart failure        Social History History   Substance Use Topics   .  Smoking status:  Never Smoker    .  Smokeless tobacco:  Not on file   .  Alcohol Use:  No        No Known Allergies    Current Outpatient Prescriptions   Medication  Sig  Dispense  Refill   .  Albiglutide (TANZEUM) 30 MG PEN  Inject into the skin.         [name]  glimepiride (AMARYL) 4  MG tablet  Take 4 mg by mouth daily before breakfast.         .  losartan-hydrochlorothiazide (HYZAAR) 100-12.5 MG per tablet  Take 1 tablet by mouth daily.         .  metFORMIN (GLUCOPHAGE) 1000 MG tablet  Take 1,000 mg by mouth 2 (two) times daily with a meal.         .  pravastatin (PRAVACHOL) 40 MG tablet  Take 40 mg by mouth daily.         [name]  traMADol (ULTRAM) 50 MG tablet  Take 1 tablet (50 mg total) by mouth every 6 (six) hours as needed for pain.   30 tablet   0       No current facility-administered medications for this visit.        Review of Systems   Constitutional: Negative for fever, chills and unexpected weight change.  HENT: Negative for congestion, hearing loss, sore throat, trouble swallowing and voice change.   Eyes: Negative for visual disturbance.  Respiratory: Negative for cough and wheezing.   Cardiovascular: Negative for chest pain, palpitations and leg swelling.  Gastrointestinal: Negative for nausea, vomiting, abdominal pain, diarrhea, constipation, blood in stool, abdominal distention, anal bleeding and rectal pain.  Genitourinary: Positive for scrotal swelling. Negative for hematuria and difficulty urinating.  Musculoskeletal: Negative for arthralgias.  Skin: Negative for rash and wound.  Neurological: Negative for seizures, syncope, weakness and headaches.  Hematological: Negative for adenopathy. Does not bruise/bleed easily.  Psychiatric/Behavioral: Negative for confusion.      Blood pressure 130/80, pulse 88, temperature 97.3 F (36.3 C), resp. rate 14, height 5\' 5"  (1.651 m), weight 213 lb 6.4 oz (96.798 kg).   Physical Exam   Constitutional: He is oriented to person, place, and time. He appears well-developed and well-nourished. No distress.  HENT:   Head: Normocephalic.   Nose: Nose normal.   Mouth/Throat: No oropharyngeal exudate.  Eyes: Conjunctivae and EOM are normal. Pupils are equal, round, and reactive to light. Right eye exhibits no  discharge. Left eye exhibits no discharge. No scleral icterus.  Neck: Normal range of motion. Neck supple. No JVD present. No tracheal deviation present. No thyromegaly present.  Cardiovascular: Normal rate, regular rhythm, normal heart sounds and intact distal pulses.    No murmur heard. Pulmonary/Chest: Effort normal and breath sounds normal. No stridor. No respiratory distress. He has no wheezes. He has no rales. He exhibits no tenderness.  Abdominal: Soft. Bowel sounds are normal. He exhibits no distension and no mass. There is no tenderness. There is no rebound and no guarding.  Umbilical hernia, easily reducible, nontender  Genitourinary:  Very large left inguinal hernia extending down into the scrotum. I was able to reduce this when he is supine but it took some time and some effort. No evidence of hernia on the right.  Musculoskeletal: Normal range of motion. He exhibits no edema and no tenderness.  Lymphadenopathy:    He has no cervical adenopathy.  Neurological: He is alert and oriented to person, place, and time. He has normal reflexes. Coordination normal.  Skin: Skin is warm and dry. No rash noted. He is not diaphoretic. No erythema. No pallor.  Psychiatric: He has a normal mood and affect. His behavior is normal. Judgment and thought content normal.      Data Reviewed Dr. Cy Blamer office notes   Assessment    Large, long-standing indirect left inguinal hernia. Minimally symptomatic but enlarging and at risk for incarceration and strangulation   Diabetes mellitus   Hypertension   Hyperlipidemia   Stable health status was good performance status      Plan    I discussed the anatomy and natural history of his hernia. I offered to repair this electively and he wishes to have that done   We will reschedule for elective repair of his very large left inguinal hernia with mesh in the near future   I discussed the indications, details, techniques, and numerous  risks of the surgery with him and his wife.  He's aware of the risk of bleeding, infection, recurrence, nerve damage with chronic pain, injury to the bladder or intestine which is rare, and other unforeseen problems. He understands all these issues and all his questions were answered. He agrees with this plan.           Edsel Petrin. Dalbert Batman, M.D., [facility] General and Minimally invasive Surgery Breast and Colorectal Surgery Office:   [phone] Pager:   [phone]

## 2013-06-08 MED ORDER — CEFAZOLIN SODIUM-DEXTROSE 2-3 GM-% IV SOLR
2.0000 g | INTRAVENOUS | Status: AC
  Administered 2013-06-09: 2 g via INTRAVENOUS
  Filled 2013-06-08: qty 50

## 2013-06-09 ENCOUNTER — Ambulatory Visit (HOSPITAL_COMMUNITY): Payer: Medicare Other | Admitting: Certified Registered"

## 2013-06-09 ENCOUNTER — Encounter (HOSPITAL_COMMUNITY)
Admission: RE | Disposition: A | Discharge status: Home / Self Care | Payer: Self-pay | Source: Ambulatory Visit | Attending: General Surgery

## 2013-06-09 ENCOUNTER — Encounter (HOSPITAL_COMMUNITY): Payer: Self-pay | Admitting: *Deleted

## 2013-06-09 ENCOUNTER — Encounter (HOSPITAL_COMMUNITY): Payer: Medicare Other | Admitting: Certified Registered"

## 2013-06-09 ENCOUNTER — Ambulatory Visit (HOSPITAL_COMMUNITY)
Admission: RE | Admit: 2013-06-09 | Discharge: 2013-06-09 | Disposition: A | Discharge status: Home / Self Care | Payer: Medicare Other | Source: Ambulatory Visit | Attending: General Surgery | Admitting: General Surgery

## 2013-06-09 DIAGNOSIS — E785 Hyperlipidemia, unspecified: Secondary | ICD-10-CM | POA: Insufficient documentation

## 2013-06-09 DIAGNOSIS — K4030 Unilateral inguinal hernia, with obstruction, without gangrene, not specified as recurrent: Secondary | ICD-10-CM | POA: Insufficient documentation

## 2013-06-09 DIAGNOSIS — I10 Essential (primary) hypertension: Secondary | ICD-10-CM | POA: Insufficient documentation

## 2013-06-09 DIAGNOSIS — Z79899 Other long term (current) drug therapy: Secondary | ICD-10-CM | POA: Insufficient documentation

## 2013-06-09 DIAGNOSIS — E119 Type 2 diabetes mellitus without complications: Secondary | ICD-10-CM | POA: Insufficient documentation

## 2013-06-09 DIAGNOSIS — M129 Arthropathy, unspecified: Secondary | ICD-10-CM | POA: Insufficient documentation

## 2013-06-09 DIAGNOSIS — K4090 Unilateral inguinal hernia, without obstruction or gangrene, not specified as recurrent: Secondary | ICD-10-CM | POA: Diagnosis present

## 2013-06-09 HISTORY — PX: INGUINAL HERNIA REPAIR: SHX194

## 2013-06-09 HISTORY — PX: INSERTION OF MESH: SHX5868

## 2013-06-09 LAB — GLUCOSE, CAPILLARY
Glucose-Capillary: 111 mg/dL — ABNORMAL HIGH (ref 70–99)
Glucose-Capillary: 96 mg/dL (ref 70–99)

## 2013-06-09 SURGERY — REPAIR, HERNIA, INGUINAL, ADULT
Anesthesia: General | Site: Groin | Laterality: Left

## 2013-06-09 MED ORDER — CHLORHEXIDINE GLUCONATE 4 % EX LIQD
1.0000 "application " | Freq: Once | CUTANEOUS | Status: DC
  Filled 2013-06-09: qty 15

## 2013-06-09 MED ORDER — FENTANYL CITRATE 0.05 MG/ML IJ SOLN
INTRAMUSCULAR | Status: AC
  Administered 2013-06-09: 100 ug via INTRAVENOUS
  Filled 2013-06-09: qty 2

## 2013-06-09 MED ORDER — LACTATED RINGERS IV SOLN
INTRAVENOUS | Status: DC
  Administered 2013-06-09: 10:00:00 via INTRAVENOUS

## 2013-06-09 MED ORDER — NEOSTIGMINE METHYLSULFATE 10 MG/10ML IV SOLN
INTRAVENOUS | Status: AC
  Filled 2013-06-09: qty 1

## 2013-06-09 MED ORDER — ONDANSETRON HCL 4 MG/2ML IJ SOLN: 4.0000 mg | Freq: Once | INTRAMUSCULAR | Status: DC | PRN

## 2013-06-09 MED ORDER — ROCURONIUM BROMIDE 100 MG/10ML IV SOLN
INTRAVENOUS | Status: DC | PRN
Start: 2013-06-09 — End: 2013-06-09
  Administered 2013-06-09: 40 mg via INTRAVENOUS
  Administered 2013-06-09: 5 mg via INTRAVENOUS

## 2013-06-09 MED ORDER — OXYCODONE-ACETAMINOPHEN 7.5-325 MG PO TABS: 1.0000 | ORAL_TABLET | ORAL | Status: DC | PRN

## 2013-06-09 MED ORDER — GLYCOPYRROLATE 0.2 MG/ML IJ SOLN
INTRAMUSCULAR | Status: AC
  Filled 2013-06-09: qty 3

## 2013-06-09 MED ORDER — FENTANYL CITRATE 0.05 MG/ML IJ SOLN
50.0000 ug | INTRAMUSCULAR | Status: DC | PRN
  Administered 2013-06-09: 100 ug via INTRAVENOUS

## 2013-06-09 MED ORDER — PROPOFOL 10 MG/ML IV BOLUS
INTRAVENOUS | Status: DC | PRN
  Administered 2013-06-09: 100 mg via INTRAVENOUS

## 2013-06-09 MED ORDER — FENTANYL CITRATE 0.05 MG/ML IJ SOLN
INTRAMUSCULAR | Status: DC | PRN
  Administered 2013-06-09: 100 ug via INTRAVENOUS

## 2013-06-09 MED ORDER — MIDAZOLAM HCL 2 MG/2ML IJ SOLN
INTRAMUSCULAR | Status: AC
  Administered 2013-06-09: 1 mg via INTRAVENOUS
  Filled 2013-06-09: qty 2

## 2013-06-09 MED ORDER — SODIUM CHLORIDE 0.9 % IV SOLN
INTRAVENOUS | Status: DC | PRN
  Administered 2013-06-09: 12:00:00 via INTRAVENOUS

## 2013-06-09 MED ORDER — ONDANSETRON HCL 4 MG/2ML IJ SOLN
INTRAMUSCULAR | Status: DC | PRN
  Administered 2013-06-09: 4 mg via INTRAVENOUS

## 2013-06-09 MED ORDER — LIDOCAINE HCL (CARDIAC) 20 MG/ML IV SOLN
INTRAVENOUS | Status: AC
  Filled 2013-06-09: qty 5

## 2013-06-09 MED ORDER — ROCURONIUM BROMIDE 50 MG/5ML IV SOLN
INTRAVENOUS | Status: AC
  Filled 2013-06-09: qty 1

## 2013-06-09 MED ORDER — BUPIVACAINE-EPINEPHRINE (PF) 0.5% -1:200000 IJ SOLN
INTRAMUSCULAR | Status: AC
  Filled 2013-06-09: qty 30

## 2013-06-09 MED ORDER — FENTANYL CITRATE 0.05 MG/ML IJ SOLN
25.0000 ug | INTRAMUSCULAR | Status: DC | PRN
  Administered 2013-06-09 (×3): 25 ug via INTRAVENOUS

## 2013-06-09 MED ORDER — PROPOFOL 10 MG/ML IV BOLUS
INTRAVENOUS | Status: AC
  Filled 2013-06-09: qty 20

## 2013-06-09 MED ORDER — GLYCOPYRROLATE 0.2 MG/ML IJ SOLN
INTRAMUSCULAR | Status: DC | PRN
Start: 2013-06-09 — End: 2013-06-09
  Administered 2013-06-09: .6 mg via INTRAVENOUS

## 2013-06-09 MED ORDER — BUPIVACAINE-EPINEPHRINE 0.5% -1:200000 IJ SOLN
INTRAMUSCULAR | Status: DC | PRN
  Administered 2013-06-09: 10 mL

## 2013-06-09 MED ORDER — ONDANSETRON HCL 4 MG/2ML IJ SOLN
INTRAMUSCULAR | Status: AC
  Filled 2013-06-09: qty 2

## 2013-06-09 MED ORDER — FENTANYL CITRATE 0.05 MG/ML IJ SOLN
INTRAMUSCULAR | Status: AC
  Filled 2013-06-09: qty 2

## 2013-06-09 MED ORDER — 0.9 % SODIUM CHLORIDE (POUR BTL) OPTIME
TOPICAL | Status: DC | PRN
  Administered 2013-06-09: 1000 mL

## 2013-06-09 MED ORDER — LIDOCAINE HCL (CARDIAC) 20 MG/ML IV SOLN
INTRAVENOUS | Status: DC | PRN
  Administered 2013-06-09: 60 mg via INTRAVENOUS

## 2013-06-09 MED ORDER — SODIUM CHLORIDE 0.9 % IV SOLN
10.0000 mg | INTRAVENOUS | Status: DC | PRN
  Administered 2013-06-09: 25 ug/min via INTRAVENOUS

## 2013-06-09 MED ORDER — MIDAZOLAM HCL 2 MG/2ML IJ SOLN
INTRAMUSCULAR | Status: AC
  Filled 2013-06-09: qty 2

## 2013-06-09 MED ORDER — FENTANYL CITRATE 0.05 MG/ML IJ SOLN
INTRAMUSCULAR | Status: AC
  Filled 2013-06-09: qty 5

## 2013-06-09 MED ORDER — MIDAZOLAM HCL 2 MG/2ML IJ SOLN
1.0000 mg | INTRAMUSCULAR | Status: DC | PRN
  Administered 2013-06-09: 1 mg via INTRAVENOUS

## 2013-06-09 MED ORDER — LACTATED RINGERS IV SOLN
INTRAVENOUS | Status: DC | PRN
  Administered 2013-06-09: 11:00:00 via INTRAVENOUS

## 2013-06-09 MED ORDER — NEOSTIGMINE METHYLSULFATE 10 MG/10ML IV SOLN
INTRAVENOUS | Status: DC | PRN
  Administered 2013-06-09: 4 mg via INTRAVENOUS

## 2013-06-09 SURGICAL SUPPLY — 50 items
ADH SKN CLS APL DERMABOND .7 (GAUZE/BANDAGES/DRESSINGS) ×1
BLADE SURG ROTATE 9660 (MISCELLANEOUS) ×2 IMPLANT
CANISTER SUCTION 2500CC (MISCELLANEOUS) ×2 IMPLANT
CHLORAPREP W/TINT 26ML (MISCELLANEOUS) ×3 IMPLANT
COVER SURGICAL LIGHT HANDLE (MISCELLANEOUS) ×3 IMPLANT
DERMABOND ADVANCED (GAUZE/BANDAGES/DRESSINGS) ×2
DERMABOND ADVANCED .7 DNX12 (GAUZE/BANDAGES/DRESSINGS) ×1 IMPLANT
DRAIN PENROSE 1/2X12 LTX STRL (WOUND CARE) ×2 IMPLANT
DRAPE LAPAROTOMY TRNSV 102X78 (DRAPE) ×3 IMPLANT
DRAPE UTILITY 15X26 W/TAPE STR (DRAPE) ×6 IMPLANT
ELECT CAUTERY BLADE 6.4 (BLADE) ×3 IMPLANT
ELECT REM PT RETURN 9FT ADLT (ELECTROSURGICAL) ×3
ELECTRODE REM PT RTRN 9FT ADLT (ELECTROSURGICAL) ×1 IMPLANT
GLOVE BIO SURGEON STRL SZ7 (GLOVE) ×4 IMPLANT
GLOVE BIOGEL PI IND STRL 7.0 (GLOVE) IMPLANT
GLOVE BIOGEL PI INDICATOR 7.0 (GLOVE) ×6
GLOVE EUDERMIC 7 POWDERFREE (GLOVE) ×3 IMPLANT
GLOVE SURG SS PI 7.0 STRL IVOR (GLOVE) ×4 IMPLANT
GOWN STRL REUS W/ TWL LRG LVL3 (GOWN DISPOSABLE) ×1 IMPLANT
GOWN STRL REUS W/ TWL XL LVL3 (GOWN DISPOSABLE) ×1 IMPLANT
GOWN STRL REUS W/TWL LRG LVL3 (GOWN DISPOSABLE) ×6
GOWN STRL REUS W/TWL XL LVL3 (GOWN DISPOSABLE) ×3
KIT BASIN OR (CUSTOM PROCEDURE TRAY) ×3 IMPLANT
KIT ROOM TURNOVER OR (KITS) ×3 IMPLANT
MESH ULTRAPRO 3X6 7.6X15CM (Mesh General) ×2 IMPLANT
NDL HYPO 25GX1X1/2 BEV (NEEDLE) ×1 IMPLANT
NEEDLE 22X1 1/2 (OR ONLY) (NEEDLE) ×2 IMPLANT
NEEDLE HYPO 25GX1X1/2 BEV (NEEDLE) ×3 IMPLANT
NS IRRIG 1000ML POUR BTL (IV SOLUTION) ×3 IMPLANT
PACK SURGICAL SETUP 50X90 (CUSTOM PROCEDURE TRAY) ×3 IMPLANT
PAD ARMBOARD 7.5X6 YLW CONV (MISCELLANEOUS) ×3 IMPLANT
PENCIL BUTTON HOLSTER BLD 10FT (ELECTRODE) ×3 IMPLANT
SPONGE INTESTINAL PEANUT (DISPOSABLE) ×2 IMPLANT
SPONGE LAP 18X18 X RAY DECT (DISPOSABLE) ×3 IMPLANT
SUT MNCRL AB 4-0 PS2 18 (SUTURE) ×3 IMPLANT
SUT PROLENE 2 0 CT2 30 (SUTURE) ×14 IMPLANT
SUT SILK 2 0 (SUTURE) ×3
SUT SILK 2-0 18XBRD TIE 12 (SUTURE) ×1 IMPLANT
SUT VIC AB 2-0 CT1 27 (SUTURE) ×12
SUT VIC AB 2-0 CT1 TAPERPNT 27 (SUTURE) ×1 IMPLANT
SUT VIC AB 3-0 SH 27 (SUTURE) ×3
SUT VIC AB 3-0 SH 27XBRD (SUTURE) ×1 IMPLANT
SUT VICRYL AB 2 0 TIES (SUTURE) ×5 IMPLANT
SYR BULB 3OZ (MISCELLANEOUS) ×3 IMPLANT
SYR CONTROL 10ML LL (SYRINGE) ×3 IMPLANT
TOWEL OR 17X24 6PK STRL BLUE (TOWEL DISPOSABLE) ×1 IMPLANT
TOWEL OR 17X26 10 PK STRL BLUE (TOWEL DISPOSABLE) ×3 IMPLANT
TUBE CONNECTING 12'X1/4 (SUCTIONS) ×1
TUBE CONNECTING 12X1/4 (SUCTIONS) ×1 IMPLANT
YANKAUER SUCT BULB TIP NO VENT (SUCTIONS) ×2 IMPLANT

## 2013-06-09 NOTE — Transfer of Care (Signed)
Immediate Anesthesia Transfer of Care Note  Patient: Justin Holloway  Procedure(s) Performed: Procedure(s): OPEN REPAIR LEFT INGUINAL HERNIA (Left) INSERTION OF MESH (Left)  Patient Location: PACU  Anesthesia Type:General  Level of Consciousness: awake and alert   Airway & Oxygen Therapy: Patient Spontanous Breathing and Patient connected to nasal cannula oxygen  Post-op Assessment: Report given to PACU RN and Post -op Vital signs reviewed and stable  Post vital signs: Reviewed and stable  Complications: No apparent anesthesia complications

## 2013-06-09 NOTE — Anesthesia Postprocedure Evaluation (Signed)
Anesthesia Post Note  Patient: Justin Holloway  Procedure(s) Performed: Procedure(s) (LRB): OPEN REPAIR LEFT INGUINAL HERNIA (Left) INSERTION OF MESH (Left)  Anesthesia type: general  Patient location: PACU  Post pain: Pain level controlled  Post assessment: Patient's Cardiovascular Status Stable  Last Vitals:  Filed Vitals:   06/09/13 1330  BP: 111/51  Pulse: 65  Temp: 36.8 C  Resp: 9    Post vital signs: Reviewed and stable  Level of consciousness: sedated  Complications: No apparent anesthesia complications

## 2013-06-09 NOTE — Op Note (Signed)
Patient Name:           GLENWOOD PENDELTON   Date of Surgery:        06/09/2013   Note: This dictation was prepared with Dragon/digital dictation along with [facility] technology. Any transcriptional errors that result from this process are unintentional.  Pre op Diagnosis:      Partially incarcerated left inguinal hernia  Post op Diagnosis:    Partially incarcerated, sliding left inguinal hernia  Procedure:                 Open repair left inguinal hernia with mesh  Surgeon:                     Edsel Petrin. Dalbert Batman, M.D., FACS  Assistant:                      None  Operative Indications:   Justin Holloway is a 80 y.o. male. He is referred by Dr. Rana Snare for evaluation of left inguinal hernia. Dr. Heath Gold is his PCP.  The patient states that he has had a swelling in his left groin since he was a young man. It has been getting larger this last year and has extended well down into the scrotum. No prior history of any hernia surgery. He says he has no pain. His wife is with him and agrees that has gotten much larger. On examination he is a very large hernia and extends into the scrotum when standing. I can partially reduces when it is supine. The tissues are soft. No evidence of hernia on the right. No history of any prior surgery. Non-insulin-dependent diabetes. Hypertension. Hyperlipidemia   Operative Findings:       The patient had a very large indirect left inguinal hernia. This was a sliding hernia containing appendices epiploica and sigmoid colon making up part of the wall of the sac. This required a fairly extensive careful dissection to reduce this back into the abdominal cavity.  Procedure in Detail:          Following the induction of general endotracheal anesthesia the patient's abdomen and genitalia were prepped and draped in a sterile fashion. Intravenous antibiotics were given. Surgical time out performed.      Dr. Al Corpus performed a TAPP  Block preoperatively.      0.5%  Marcaine with epinephrine was used as local infiltration into the skin and cutaneous tissue. A transverse incision was made in the left groin overlying the inguinal canal. Dissection was carried down through subcutaneous tissue until I encountered a very large hernia sac. We slowly had to dissect this away from the deep subcutaneous tissue before ever getting to the external oblique. We actually had to pull up and evert the  testicle to get this completely mobilized and get a Penrose drain around the cord structures. With some effort I was able to mostly reduce the hernia. I then found the external oblique laterally and incised it so that it opened up the floor of the inguinal canal better. I spent a long time dissecting out the hernia sac. The cremasteric muscle fibers were slowly dissected away. I identified the testicular vessels and vas deferens. i slowly dissected the hernia sac away and milked everything back within the abdominal cavity and I was able to open the sac and observe inside. Taking the dissection into the sac I encountered  chronic adhesions of the sigmoid colon and appendices epiploica. These were slowly  taken down with scissor dissection and ultimately I was able to reduce this back into the peritoneal space. I closed the indirect sac with a pursestring suture of 2-0 Vicryl and resected the redundant sac. The internal inguinal ring was tightened laterally with some interrupted sutures of 2-0 Vicryl placing these lateral to the cord structures.      I used a 3" x 6" piece of UltraPro mesh. This was trimmed at the corners a little bit and secured  throughout with interrupted mattress sutures of 2-0 Prolene. The mesh was sutured so as to generously overlap the fascia at the pubic tubercle, and along the inguinal ligament inferiorly. Mattress sutures were placed superiorly, superior laterally and medially. The mesh was incised laterally so as to wrap around the cord structures at the internal ring.  The tails of the mesh were overlapped laterally and further sutures were placed laterally. This provided very secure repair and coverage both medial and lateral to the internal ring but allowed adequate fingertip opening for the cord structures. It was irrigated with saline. There was no bleeding. The external oblique was closed with running suture of 2-0 Vicryl. Scarpa's fascia was closed with 2-0 Vicryl sutures. Skin was closed with a running subcuticular suture of 4-0 Monocryl and Dermabond. Patient tolerated procedure well was taken to PACU stable condition. EBL 20 cc. Counts correct. Complications none.           Edsel Petrin. Dalbert Batman, M.D., FACS General and Minimally Invasive Surgery Breast and Colorectal Surgery  06/09/2013 1:22 PM

## 2013-06-09 NOTE — Interval H&P Note (Signed)
History and Physical Interval Note:  06/09/2013 11:01 AM  Justin Holloway  has presented today for surgery, with the diagnosis of left inguinal hernia  The goals and the various methods of treatment have been discussed with the patient and family. After consideration of risks, benefits and other options for treatment, the patient has consented to  Procedure(s): OPEN REPAIR LEFT INGUINAL HERNIA (Left) INSERTION OF MESH (Left) as a surgical intervention .  The patient's history has been reviewed, patient examined, no change in status, stable for surgery.  I have reviewed the patient's chart and labs.  Questions were answered to the patient's satisfaction.     [name]

## 2013-06-09 NOTE — Discharge Instructions (Signed)
[facility] _____[facility]  UMBILICAL OR INGUINAL HERNIA REPAIR: POST OP INSTRUCTIONS  Always review your discharge instruction sheet given to you by the facility where your surgery was performed. IF YOU HAVE DISABILITY OR FAMILY LEAVE FORMS, YOU MUST BRING THEM TO THE OFFICE FOR PROCESSING.   DO NOT GIVE THEM TO YOUR DOCTOR.  1. A  prescription for pain medication may be given to you upon discharge.  Take your pain medication as prescribed, if needed.  If narcotic pain medicine is not needed, then you may take acetaminophen (Tylenol) or ibuprofen (Advil) as needed. 2. Take your usually prescribed medications unless otherwise directed. 3. If you need a refill on your pain medication, please contact your pharmacy.  They will contact our office to request authorization. Prescriptions will not be filled after 5 pm or on week-ends. 4. You should follow a light diet the first 24 hours after arrival home, such as soup and crackers, etc.  Be sure to include lots of fluids daily.  Resume your normal diet the day after surgery. 5. Most patients will experience some swelling and bruising around the umbilicus or in the groin and scrotum.  Ice packs and reclining will help.  Swelling and bruising can take several days to resolve.  6. It is common to experience some constipation if taking pain medication after surgery.  Increasing fluid intake and taking a stool softener (such as Colace) will usually help or prevent this problem from occurring.  A mild laxative (Milk of Magnesia or Miralax) should be taken according to package directions if there are no bowel movements after 48 hours. 7. Unless discharge instructions indicate otherwise, you may remove your bandages 24-48 hours after surgery, and you may shower at that time.  You may have steri-strips (small skin tapes) in place directly over the incision.  These strips should be left on the skin for 7-10 days.  If your surgeon used skin glue on the  incision, you may shower in 24 hours.  The glue will flake off over the next 2-3 weeks.  Any sutures or staples will be removed at the office during your follow-up visit. 8. ACTIVITIES:  You may resume regular (light) daily activities beginning the next day--such as daily self-care, walking, climbing stairs--gradually increasing activities as tolerated.  You may have sexual intercourse when it is comfortable.  Refrain from any heavy lifting or straining until approved by your doctor. a. You may drive when you are no longer taking prescription pain medication, you can comfortably wear a seatbelt, and you can safely maneuver your car and apply brakes. b. RETURN TO WORK:  __________________________________________________________ 9. You should see your doctor in the office for a follow-up appointment approximately 2-3 weeks after your surgery.  Make sure that you call for this appointment within a day or two after you arrive home to insure a convenient appointment time. 10. OTHER INSTRUCTIONS:  __________________________________________________________________________________________________________________________________________________________________________________________  WHEN TO CALL YOUR DOCTOR: 1. Fever over 101.0 2. Inability to urinate 3. Nausea and/or vomiting 4. Extreme swelling or bruising 5. Continued bleeding from incision. 6. Increased pain, redness, or drainage from the incision  The clinic staff is available to answer your questions during regular business hours.  Please don't hesitate to call and ask to speak to one of the nurses for clinical concerns.  If you have a medical emergency, go to the nearest emergency room or call 911.  A surgeon from [facility] is always on call at the hospital     [address], [address], [place], [place]  [address] ?  [address] [address], [place], [place]   [address] [phone] ? [phone] ? FAX [phone] Web site:  [email or web address]  

## 2013-06-09 NOTE — Anesthesia Preprocedure Evaluation (Signed)
Anesthesia Evaluation  Patient identified by MRN, date of birth, ID band Patient awake    Reviewed: Allergy & Precautions, H&P , NPO status , Patient's Chart, lab work & pertinent test results, reviewed documented beta blocker date and time   Airway Mallampati: I TM Distance: >3 FB Neck ROM: Full    Dental  (+) Edentulous Upper, Edentulous Lower   Pulmonary former smoker,  breath sounds clear to auscultation        Cardiovascular hypertension, Pt. on medications and Pt. on home beta blockers Rhythm:Regular Rate:Normal     Neuro/Psych    GI/Hepatic   Endo/Other  diabetes, Well Controlled, Type 2, Oral Hypoglycemic AgentsMorbid obesity  Renal/GU      Musculoskeletal   Abdominal   Peds  Hematology   Anesthesia Other Findings   Reproductive/Obstetrics                           Anesthesia Physical Anesthesia Plan  ASA: III  Anesthesia Plan: General   Post-op Pain Management:    Induction: Intravenous  Airway Management Planned: Oral ETT  Additional Equipment:   Intra-op Plan:   Post-operative Plan: Extubation in OR  Informed Consent: I have reviewed the patients History and Physical, chart, labs and discussed the procedure including the risks, benefits and alternatives for the proposed anesthesia with the patient or authorized representative who has indicated his/her understanding and acceptance.   Dental advisory given  Plan Discussed with: CRNA, Anesthesiologist and Surgeon  Anesthesia Plan Comments:         Anesthesia Quick Evaluation

## 2013-06-09 NOTE — Anesthesia Procedure Notes (Addendum)
Anesthesia Regional Block:  TAP block  Pre-Anesthetic Checklist: ,, timeout performed, Correct Patient, Correct Site, Correct Laterality, Correct Procedure, Correct Position, site marked, Risks and benefits discussed,  Surgical consent,  Pre-op evaluation,  At surgeon's request and post-op pain management  Laterality: Left and Lower  Prep: chloraprep       Needles:  Injection technique: Single-shot  Needle Type: Echogenic Needle     Needle Length: 9cm 9 cm Needle Gauge: 21 and 21 G    Additional Needles:  Procedures: ultrasound guided (picture in chart) TAP block Narrative:  Start time: 06/09/2013 10:48 AM End time: 06/09/2013 10:55 AM Injection made incrementally with aspirations every 5 mL.  Performed by: Personally  Anesthesiologist: Lorrene Reid, MD   Procedure Name: Intubation Date/Time: 06/09/2013 11:42 AM Performed by: Octavio Graves Pre-anesthesia Checklist: Patient identified, Timeout performed, Emergency Drugs available, Suction available and Patient being monitored Patient Re-evaluated:Patient Re-evaluated prior to inductionOxygen Delivery Method: Circle system utilized Preoxygenation: Pre-oxygenation with 100% oxygen Intubation Type: IV induction Ventilation: Mask ventilation without difficulty Laryngoscope Size: Miller and 2 Grade View: Grade I Tube type: Oral Number of attempts: 1 Airway Equipment and Method: Stylet Placement Confirmation: breath sounds checked- equal and bilateral,  ETT inserted through vocal cords under direct vision and positive ETCO2 Secured at: 23 cm Tube secured with: Tape Dental Injury: Teeth and Oropharynx as per pre-operative assessment  Comments: IV induction Fitzgerald- intubation AM CRNA- atraumatic- no teeth present- + bilat = BS Ola Spurr

## 2013-06-10 ENCOUNTER — Encounter (HOSPITAL_COMMUNITY): Payer: Self-pay | Admitting: General Surgery

## 2013-06-13 ENCOUNTER — Telehealth (INDEPENDENT_AMBULATORY_CARE_PROVIDER_SITE_OTHER): Payer: Self-pay

## 2013-06-13 NOTE — Telephone Encounter (Signed)
Unsuccessful attempt to reach the patient.  Dr. Marlou Starks recommended fleets enema with OTC Colace over the weekend.  If still no bowel movement, nausea and/or vomiting, pt to be instructed to go to ER.

## 2013-06-13 NOTE — Telephone Encounter (Signed)
Pt s/p inguinal hernia repair on 06/09/13. Pt has not had a BM since 5/31 a week from surgery. Pt states that he has been taking MOM twice daily, eating prunes and high fiber foods. Pt states that he has been up walking around as much as possible and he is only taking pain meds once a day. Pt advises to take Miralax as directed on the label at least twice daily. Informed pt that I would send Dr Darrel Hoover [facility] a message for any further recommendations since Dr Dalbert Batman is out of the office. Pt verbalized understanding and agrees with POC.

## 2013-06-26 ENCOUNTER — Ambulatory Visit (INDEPENDENT_AMBULATORY_CARE_PROVIDER_SITE_OTHER): Payer: Medicare Other | Admitting: General Surgery

## 2013-06-26 ENCOUNTER — Encounter (INDEPENDENT_AMBULATORY_CARE_PROVIDER_SITE_OTHER): Payer: Self-pay | Admitting: General Surgery

## 2013-06-26 VITALS — BP 118/80 | HR 88 | Temp 98.0°F | Resp 16 | Ht 65.0 in | Wt 203.0 lb

## 2013-06-26 DIAGNOSIS — K4090 Unilateral inguinal hernia, without obstruction or gangrene, not specified as recurrent: Secondary | ICD-10-CM

## 2013-06-26 NOTE — Progress Notes (Signed)
Patient ID: Justin Holloway, male   DOB: [birth year], 80 y.o.   MRN: [id number] History: This gentleman underwent open repair of a large sliding left inguinal hernia with mesh on 06/09/2013. The surgery was somewhat tedious but uneventful.  He states that he is doing well. Has almost no pain. Some swelling noted. Normal appetite and bowel and bladder function  Exam: The patient is alert. Pleasant. No distress. Wife is with him Left groin and scrotum are examined. The wound is healing without any signs of infection. Hernia repair is intact. Normal amount of tissue thickening which is nontender.   Assessment: Left inguinal hernia, recovering uneventfully following open repair with mesh  Plan: Diet and activities discussed. Resume usual activities after July 4. Stretching exercises emphasized Advise that tissue edema and thickening will resolve slowly over 6 months. Return to see me as needed.   Edsel Petrin. Dalbert Batman, M.D., [facility] General and Minimally invasive Surgery Breast and Colorectal Surgery Office:   [phone] Pager:   [phone]

## 2013-06-26 NOTE — Patient Instructions (Signed)
You are recovering from your left inguinal hernia surgery without any obvious complications.  You may resume normal physical activities without restriction after July 4.   Be sure to do lots of stretching before and after any physical activity.  Return to see Dr. Dalbert Batman as necessary.

## 2013-12-18 ENCOUNTER — Other Ambulatory Visit: Payer: Self-pay | Admitting: Internal Medicine

## 2013-12-18 DIAGNOSIS — R42 Dizziness and giddiness: Secondary | ICD-10-CM

## 2014-01-12 ENCOUNTER — Other Ambulatory Visit: Payer: Medicare Other

## 2014-01-26 DIAGNOSIS — I129 Hypertensive chronic kidney disease with stage 1 through stage 4 chronic kidney disease, or unspecified chronic kidney disease: Secondary | ICD-10-CM | POA: Diagnosis not present

## 2014-01-26 DIAGNOSIS — R609 Edema, unspecified: Secondary | ICD-10-CM | POA: Diagnosis not present

## 2014-01-26 DIAGNOSIS — N183 Chronic kidney disease, stage 3 (moderate): Secondary | ICD-10-CM | POA: Diagnosis not present

## 2014-02-04 ENCOUNTER — Ambulatory Visit
Admission: RE | Admit: 2014-02-04 | Discharge: 2014-02-04 | Disposition: A | Discharge status: Home / Self Care | Payer: Medicare Other | Source: Ambulatory Visit | Attending: Internal Medicine | Admitting: Internal Medicine

## 2014-02-04 DIAGNOSIS — R220 Localized swelling, mass and lump, head: Secondary | ICD-10-CM | POA: Diagnosis not present

## 2014-02-04 DIAGNOSIS — R42 Dizziness and giddiness: Secondary | ICD-10-CM

## 2014-02-04 DIAGNOSIS — I639 Cerebral infarction, unspecified: Secondary | ICD-10-CM | POA: Diagnosis not present

## 2014-02-04 DIAGNOSIS — R4702 Dysphasia: Secondary | ICD-10-CM | POA: Diagnosis not present

## 2014-02-04 MED ORDER — GADOBENATE DIMEGLUMINE 529 MG/ML IV SOLN
10.0000 mL | Freq: Once | INTRAVENOUS | Status: AC | PRN
  Administered 2014-02-04: 9 mL via INTRAVENOUS

## 2014-02-24 DIAGNOSIS — I129 Hypertensive chronic kidney disease with stage 1 through stage 4 chronic kidney disease, or unspecified chronic kidney disease: Secondary | ICD-10-CM | POA: Diagnosis not present

## 2014-02-24 DIAGNOSIS — N183 Chronic kidney disease, stage 3 (moderate): Secondary | ICD-10-CM | POA: Diagnosis not present

## 2014-02-24 DIAGNOSIS — E1122 Type 2 diabetes mellitus with diabetic chronic kidney disease: Secondary | ICD-10-CM | POA: Diagnosis not present

## 2014-02-24 DIAGNOSIS — N08 Glomerular disorders in diseases classified elsewhere: Secondary | ICD-10-CM | POA: Diagnosis not present

## 2014-06-17 ENCOUNTER — Encounter: Payer: Self-pay | Admitting: Diagnostic Neuroimaging

## 2014-06-17 ENCOUNTER — Ambulatory Visit (INDEPENDENT_AMBULATORY_CARE_PROVIDER_SITE_OTHER): Payer: Medicare Other | Admitting: Diagnostic Neuroimaging

## 2014-06-17 VITALS — BP 158/87 | HR 88 | Ht 65.0 in | Wt 215.0 lb

## 2014-06-17 DIAGNOSIS — E1142 Type 2 diabetes mellitus with diabetic polyneuropathy: Secondary | ICD-10-CM

## 2014-06-17 NOTE — Progress Notes (Signed)
[facility]  PATIENT: Justin Holloway DOB: 11-20-1929  REFERRING CLINICIAN: Johnnye Lana HISTORY FROM: patient and wife  REASON FOR VISIT: new consult    HISTORICAL  CHIEF COMPLAINT:  Chief Complaint  Patient presents with  . New Evaluation    abnormalities of gait    HISTORY OF PRESENT ILLNESS:   81 year old right-handed male with hypertension, diabetes, hypercholesterolemia, here for evaluation of gait difficulty. Patient reports at least 3-6 months of progressive gait and balance difficulty. He had some balance difficulties as long as 4 years ago. He reports gradual onset progressive symptoms. He denies any sudden unilateral focal numbness, weakness, tingling. 3 months ago he had an episode of slurred speech lasting for 3 weeks, which he did not seek medical attention for. This was noticed by patient's wife. Patient did not notice much slurred speech.  Patient denies any low back pain, neck pain, numbness or tingling. He has diabetes and recent A1c was 9.5. He is scheduled to see a diabetes specialist in a few months.  Patient tends to stagger and stumble, especially at nighttime, in dark situations. He tends to stumble backwards into the right side. Symptoms fluctuate.  Patient had MRI in January 2016 showing a midline planum sphenoidal meningioma, mild small vessel disease and chronic left thalamic lacunar infarct. Patient reports an MRI 5 years ago which also showed meningioma, but we do not have those results to compare. Patient denies any right-sided numbness or tingling with sudden onset that he can recall.   REVIEW OF SYSTEMS: Full 14 system review of systems performed and notable only for swelling in legs snoring joint pain aching muscles joint sleep dizziness slurred speech.  ALLERGIES: No Known Allergies  HOME MEDICATIONS: Outpatient Prescriptions Prior to Visit  Medication Sig Dispense Refill  . glimepiride (AMARYL) 4 MG tablet Take 4 mg by  mouth 2 (two) times daily.     [name] losartan-hydrochlorothiazide (HYZAAR) 100-12.5 MG per tablet     . pravastatin (PRAVACHOL) 40 MG tablet Take 40 mg by mouth daily.    . metFORMIN (GLUCOPHAGE) 1000 MG tablet Take 1,000-1,500 mg by mouth 2 (two) times daily with a meal. Take 1 1/2 tablet by mouth (1500 mg) in the morning and 1 tablet (1000 mg) in the evening.    . Albiglutide (TANZEUM) 30 MG PEN Inject 30 mg into the skin once a week. Inject medication on Wednesday    . losartan (COZAAR) 100 MG tablet Take 100 mg by mouth daily.    [name] oxyCODONE-acetaminophen (PERCOCET) 7.5-325 MG per tablet Take 1 tablet by mouth every 4 (four) hours as needed for pain. 30 tablet 0   No facility-administered medications prior to visit.    PAST MEDICAL HISTORY: Past Medical History  Diagnosis Date  . Diabetes mellitus without complication   . Hypertension   . Arthritis   . Hyperlipidemia     PAST SURGICAL HISTORY: Past Surgical History  Procedure Laterality Date  . Inguinal hernia repair Left 06/09/2013    Procedure: OPEN REPAIR LEFT INGUINAL HERNIA;  Surgeon: [name], MD;  Location: [facility];  Service: General;  Laterality: Left;  . Insertion of mesh Left 06/09/2013    Procedure: INSERTION OF MESH;  Surgeon: [name], MD;  Location: [facility];  Service: General;  Laterality: Left;    FAMILY HISTORY: Family History  Problem Relation Age of Onset  . Cancer Mother 73    breast  . COPD Father 78    Congestive heart failure  SOCIAL HISTORY:  History   Social History  . Marital Status: Married    Spouse Name: [name]  . Number of Children: 8  . Years of Education: 12   Occupational History  . retired    Social History Main Topics  . Smoking status: Former Smoker    Types: Cigars  . Smokeless tobacco: Not on file  . Alcohol Use: No  . Drug Use: No  . Sexual Activity: Not on file   Other Topics Concern  . Not on file   Social History Narrative   Lives with wife at home    Drinks no caffeine     PHYSICAL EXAM  Filed Vitals:   06/17/14 1108  BP: 158/87  Pulse: 88  Height: 5\' 5"  (1.651 m)  Weight: 215 lb (97.523 kg)    Body mass index is 35.78 kg/(m^2).   Visual Acuity Screening   Right eye Left eye Both eyes  Without correction: 20/40 20/40   With correction:       No flowsheet data found.  GENERAL EXAM: Patient is in no distress; well developed, nourished and groomed; neck is supple  CARDIOVASCULAR: Regular rate and rhythm, no murmurs, no carotid bruits  NEUROLOGIC: MENTAL STATUS: awake, alert, oriented to person, place and time, recent and remote memory intact, normal attention and concentration, language fluent, comprehension intact, naming intact, fund of knowledge appropriate CRANIAL NERVE: no papilledema on fundoscopic exam, pupils equal and reactive to light, visual fields full to confrontation, extraocular muscles intact, no nystagmus, facial sensation and strength symmetric, hearing intact, palate elevates symmetrically, uvula midline, shoulder shrug symmetric, tongue midline. MOTOR: normal bulk and tone, full strength in the BUE, BLE; EXCEPT HIP FLEXORS 3/5 SENSORY: normal and symmetric to light touch; DECR TEMP IN HANDS AND LEGS; ABSENT VIB AT ANKLES AND TOES; DECR PP IN FEET/LEGS COORDINATION: finger-nose-finger, fine finger movements normal REFLEXES: deep tendon reflexes ABSENT THROUGHOUT GAIT/STATION: narrow based gait; STOOPED POSTURE, LIMPING, SLOW UNSTEADY; able to walk on toes; DIFF WITH HEEL GAIT; CANNOT TANDEM; romberg is POSITIVE    DIAGNOSTIC DATA (LABS, IMAGING, TESTING) - I reviewed patient records, labs, notes, testing and imaging myself where available.  Lab Results  Component Value Date   WBC 5.9 06/04/2013   HGB 12.0* 06/04/2013   HCT 37.0* 06/04/2013   MCV 89.4 06/04/2013   PLT 204 06/04/2013      Component Value Date/Time   NA 143 06/04/2013 1314   K 5.0 06/04/2013 1314   CL 107 06/04/2013 1314   CO2  23 06/04/2013 1314   GLUCOSE 151* 06/04/2013 1314   BUN 22 06/04/2013 1314   CREATININE 1.66* 06/04/2013 1314   CALCIUM 10.2 06/04/2013 1314   PROT 7.5 06/04/2013 1314   ALBUMIN 3.8 06/04/2013 1314   AST 16 06/04/2013 1314   ALT 10 06/04/2013 1314   ALKPHOS 81 06/04/2013 1314   BILITOT 0.3 06/04/2013 1314   GFRNONAA 37* 06/04/2013 1314   GFRAA 43* 06/04/2013 1314   No results found for: CHOL, HDL, LDLCALC, LDLDIRECT, TRIG, CHOLHDL No results found for: HGBA1C No results found for: VITAMINB12 No results found for: TSH   02/04/14 MRI brain [I reviewed images myself and agree with interpretation. -VRP]  1. 3.5 cm extra-axial mass along the planum sphenoidale most consistent with a meningioma.  2. Mild chronic small vessel ischemic disease. Chronic left thalamic lacunar infarct.     ASSESSMENT AND PLAN  81 y.o. year old male here with progressive balance diff, hip flexor weakness and  sensory changes in feet. This is likely due to diabetic neuropathy +/- diabetic lumbosacral plexopathy. Will setup PT eval. He may need a cane / walker to help reduce fall risk.   Also with chronic small vessel ischemic disease due to HTN, DM, hypercholesterolemia. The chronic left thalamic lacunar infarct does not have any clinical symptoms that patient recalls, and may have been a silent infarct. --> recommend aspirin and risk factor mgmt per PCP.  Also with incidental midline meningioma --> recommend observation and repeat MRI scan in 1 year.    Dx:   Diabetic polyneuropathy associated with type 2 diabetes mellitus - Plan: Ambulatory referral to Physical Therapy   PLAN:  Orders Placed This Encounter  Procedures  . Ambulatory referral to Physical Therapy   Return in about 3 months (around 09/17/2014).    Penni Bombard, MD [id number], [id number] AM Certified in Neurology, Neurophysiology and Neuroimaging  [facility] [address], [place], [place] [address] [phone]

## 2014-06-17 NOTE — Patient Instructions (Signed)
I will setup physical therapy.

## 2014-09-21 ENCOUNTER — Ambulatory Visit: Payer: Medicare Other | Admitting: Diagnostic Neuroimaging

## 2015-08-07 ENCOUNTER — Encounter (HOSPITAL_COMMUNITY): Payer: Self-pay | Admitting: Emergency Medicine

## 2015-08-07 ENCOUNTER — Emergency Department (HOSPITAL_COMMUNITY): Payer: Medicare Other

## 2015-08-07 ENCOUNTER — Observation Stay (HOSPITAL_COMMUNITY)
Admission: EM | Admit: 2015-08-07 | Discharge: 2015-08-09 | Disposition: A | Discharge status: Home / Self Care | Payer: Medicare Other | Attending: Internal Medicine | Admitting: Internal Medicine

## 2015-08-07 DIAGNOSIS — R4781 Slurred speech: Secondary | ICD-10-CM | POA: Diagnosis not present

## 2015-08-07 DIAGNOSIS — N183 Chronic kidney disease, stage 3 unspecified: Secondary | ICD-10-CM | POA: Diagnosis present

## 2015-08-07 DIAGNOSIS — N189 Chronic kidney disease, unspecified: Secondary | ICD-10-CM | POA: Insufficient documentation

## 2015-08-07 DIAGNOSIS — E785 Hyperlipidemia, unspecified: Secondary | ICD-10-CM | POA: Diagnosis not present

## 2015-08-07 DIAGNOSIS — Z7982 Long term (current) use of aspirin: Secondary | ICD-10-CM | POA: Insufficient documentation

## 2015-08-07 DIAGNOSIS — N390 Urinary tract infection, site not specified: Secondary | ICD-10-CM | POA: Diagnosis present

## 2015-08-07 DIAGNOSIS — M1990 Unspecified osteoarthritis, unspecified site: Secondary | ICD-10-CM | POA: Insufficient documentation

## 2015-08-07 DIAGNOSIS — Z87891 Personal history of nicotine dependence: Secondary | ICD-10-CM | POA: Insufficient documentation

## 2015-08-07 DIAGNOSIS — E860 Dehydration: Secondary | ICD-10-CM | POA: Insufficient documentation

## 2015-08-07 DIAGNOSIS — Z79899 Other long term (current) drug therapy: Secondary | ICD-10-CM | POA: Insufficient documentation

## 2015-08-07 DIAGNOSIS — Z794 Long term (current) use of insulin: Secondary | ICD-10-CM | POA: Diagnosis not present

## 2015-08-07 DIAGNOSIS — E1122 Type 2 diabetes mellitus with diabetic chronic kidney disease: Secondary | ICD-10-CM | POA: Diagnosis not present

## 2015-08-07 DIAGNOSIS — Z8673 Personal history of transient ischemic attack (TIA), and cerebral infarction without residual deficits: Secondary | ICD-10-CM | POA: Insufficient documentation

## 2015-08-07 DIAGNOSIS — E119 Type 2 diabetes mellitus without complications: Secondary | ICD-10-CM | POA: Diagnosis not present

## 2015-08-07 DIAGNOSIS — H538 Other visual disturbances: Secondary | ICD-10-CM | POA: Diagnosis not present

## 2015-08-07 DIAGNOSIS — I129 Hypertensive chronic kidney disease with stage 1 through stage 4 chronic kidney disease, or unspecified chronic kidney disease: Secondary | ICD-10-CM | POA: Diagnosis not present

## 2015-08-07 DIAGNOSIS — N179 Acute kidney failure, unspecified: Secondary | ICD-10-CM

## 2015-08-07 DIAGNOSIS — R42 Dizziness and giddiness: Secondary | ICD-10-CM

## 2015-08-07 DIAGNOSIS — E1165 Type 2 diabetes mellitus with hyperglycemia: Secondary | ICD-10-CM | POA: Diagnosis not present

## 2015-08-07 DIAGNOSIS — I10 Essential (primary) hypertension: Secondary | ICD-10-CM

## 2015-08-07 DIAGNOSIS — E875 Hyperkalemia: Principal | ICD-10-CM | POA: Diagnosis present

## 2015-08-07 HISTORY — DX: Chronic kidney disease, stage 3 (moderate): N18.3

## 2015-08-07 HISTORY — DX: Chronic kidney disease, stage 3 unspecified: N18.30

## 2015-08-07 LAB — BASIC METABOLIC PANEL
Anion gap: 7 (ref 5–15)
BUN: 46 mg/dL — ABNORMAL HIGH (ref 6–20)
CHLORIDE: 111 mmol/L (ref 101–111)
CO2: 16 mmol/L — ABNORMAL LOW (ref 22–32)
Calcium: 9.4 mg/dL (ref 8.9–10.3)
Creatinine, Ser: 2.35 mg/dL — ABNORMAL HIGH (ref 0.61–1.24)
GFR calc Af Amer: 28 mL/min — ABNORMAL LOW (ref >60)
GFR calc non Af Amer: 24 mL/min — ABNORMAL LOW (ref >60)
Glucose, Bld: 261 mg/dL — ABNORMAL HIGH (ref 65–99)
POTASSIUM: 5.8 mmol/L — AB (ref 3.5–5.1)
Sodium: 134 mmol/L — ABNORMAL LOW (ref 135–145)

## 2015-08-07 LAB — URINE MICROSCOPIC-ADD ON

## 2015-08-07 LAB — URINALYSIS, ROUTINE W REFLEX MICROSCOPIC
Bilirubin Urine: NEGATIVE
Glucose, UA: 100 mg/dL — AB
Hgb urine dipstick: NEGATIVE
Ketones, ur: NEGATIVE mg/dL
Nitrite: POSITIVE — AB
PH: 5 (ref 5.0–8.0)
PROTEIN: NEGATIVE mg/dL
SPECIFIC GRAVITY, URINE: 1.02 (ref 1.005–1.030)

## 2015-08-07 LAB — CBC
HEMATOCRIT: 34.9 % — AB (ref 39.0–52.0)
Hemoglobin: 11.6 g/dL — ABNORMAL LOW (ref 13.0–17.0)
MCH: 29.1 pg (ref 26.0–34.0)
MCHC: 33.2 g/dL (ref 30.0–36.0)
MCV: 87.7 fL (ref 78.0–100.0)
Platelets: 214 10*3/uL (ref 150–400)
RBC: 3.98 MIL/uL — ABNORMAL LOW (ref 4.22–5.81)
RDW: 13.5 % (ref 11.5–15.5)
WBC: 7.2 10*3/uL (ref 4.0–10.5)

## 2015-08-07 LAB — I-STAT TROPONIN, ED: TROPONIN I, POC: 0 ng/mL (ref 0.00–0.08)

## 2015-08-07 LAB — GLUCOSE, CAPILLARY: Glucose-Capillary: 286 mg/dL — ABNORMAL HIGH (ref 65–99)

## 2015-08-07 LAB — CBG MONITORING, ED: GLUCOSE-CAPILLARY: 281 mg/dL — AB (ref 65–99)

## 2015-08-07 LAB — CREATININE, URINE, RANDOM: Creatinine, Urine: 45.79 mg/dL

## 2015-08-07 MED ORDER — ACETAMINOPHEN 325 MG PO TABS: 650.0000 mg | ORAL_TABLET | Freq: Four times a day (QID) | ORAL | Status: DC | PRN

## 2015-08-07 MED ORDER — SODIUM POLYSTYRENE SULFONATE 15 GM/60ML PO SUSP
30.0000 g | Freq: Once | ORAL | Status: AC
  Administered 2015-08-07: 30 g via ORAL
  Filled 2015-08-07: qty 120

## 2015-08-07 MED ORDER — MECLIZINE HCL 25 MG PO TABS
25.0000 mg | ORAL_TABLET | Freq: Once | ORAL | Status: AC
  Administered 2015-08-07: 25 mg via ORAL
  Filled 2015-08-07: qty 1

## 2015-08-07 MED ORDER — MECLIZINE HCL 25 MG PO TABS
25.0000 mg | ORAL_TABLET | Freq: Three times a day (TID) | ORAL | Status: DC | PRN
  Filled 2015-08-07: qty 1

## 2015-08-07 MED ORDER — SODIUM CHLORIDE 0.9 % IV SOLN
Freq: Once | INTRAVENOUS | Status: AC
  Administered 2015-08-07: 22:00:00 via INTRAVENOUS

## 2015-08-07 MED ORDER — VITAMIN D 1000 UNITS PO TABS
1000.0000 [IU] | ORAL_TABLET | Freq: Two times a day (BID) | ORAL | Status: DC
  Administered 2015-08-07 – 2015-08-09 (×4): 1000 [IU] via ORAL
  Filled 2015-08-07 (×4): qty 1

## 2015-08-07 MED ORDER — ONDANSETRON HCL 4 MG PO TABS: 4.0000 mg | ORAL_TABLET | Freq: Four times a day (QID) | ORAL | Status: DC | PRN

## 2015-08-07 MED ORDER — DEXTROSE 5 % IV SOLN
1.0000 g | Freq: Once | INTRAVENOUS | Status: AC
  Administered 2015-08-08: 1 g via INTRAVENOUS
  Filled 2015-08-07: qty 10

## 2015-08-07 MED ORDER — ACETAMINOPHEN 650 MG RE SUPP: 650.0000 mg | Freq: Four times a day (QID) | RECTAL | Status: DC | PRN

## 2015-08-07 MED ORDER — SODIUM CHLORIDE 0.9% FLUSH
3.0000 mL | Freq: Two times a day (BID) | INTRAVENOUS | Status: DC
  Administered 2015-08-08 – 2015-08-09 (×2): 3 mL via INTRAVENOUS

## 2015-08-07 MED ORDER — ADULT MULTIVITAMIN W/MINERALS CH
1.0000 | ORAL_TABLET | Freq: Every day | ORAL | Status: DC
  Administered 2015-08-07 – 2015-08-09 (×3): 1 via ORAL
  Filled 2015-08-07 (×3): qty 1

## 2015-08-07 MED ORDER — AMLODIPINE BESYLATE 5 MG PO TABS
5.0000 mg | ORAL_TABLET | Freq: Every day | ORAL | Status: DC
  Administered 2015-08-08 – 2015-08-09 (×2): 5 mg via ORAL
  Filled 2015-08-07 (×2): qty 1

## 2015-08-07 MED ORDER — INSULIN ASPART 100 UNIT/ML ~~LOC~~ SOLN
0.0000 [IU] | Freq: Three times a day (TID) | SUBCUTANEOUS | Status: DC
  Administered 2015-08-08 (×2): 2 [IU] via SUBCUTANEOUS
  Administered 2015-08-08: 1 [IU] via SUBCUTANEOUS
  Administered 2015-08-09: 9 [IU] via SUBCUTANEOUS

## 2015-08-07 MED ORDER — ASPIRIN EC 81 MG PO TBEC
81.0000 mg | DELAYED_RELEASE_TABLET | Freq: Every evening | ORAL | Status: DC
  Administered 2015-08-07 – 2015-08-08 (×2): 81 mg via ORAL
  Filled 2015-08-07 (×2): qty 1

## 2015-08-07 MED ORDER — PRAVASTATIN SODIUM 40 MG PO TABS
40.0000 mg | ORAL_TABLET | Freq: Every evening | ORAL | Status: DC
Start: 2015-08-07 — End: 2015-08-09
  Administered 2015-08-07 – 2015-08-08 (×2): 40 mg via ORAL
  Filled 2015-08-07 (×2): qty 1

## 2015-08-07 MED ORDER — INSULIN GLARGINE 100 UNIT/ML ~~LOC~~ SOLN
8.0000 [IU] | Freq: Every day | SUBCUTANEOUS | Status: DC
  Administered 2015-08-07 – 2015-08-08 (×2): 8 [IU] via SUBCUTANEOUS
  Filled 2015-08-07 (×3): qty 0.08

## 2015-08-07 MED ORDER — HYDRALAZINE HCL 20 MG/ML IJ SOLN: 5.0000 mg | INTRAMUSCULAR | Status: DC | PRN

## 2015-08-07 MED ORDER — DEXTROSE 5 % IV SOLN
1.0000 g | Freq: Once | INTRAVENOUS | Status: AC
  Administered 2015-08-07: 1 g via INTRAVENOUS
  Filled 2015-08-07: qty 10

## 2015-08-07 MED ORDER — ENOXAPARIN SODIUM 40 MG/0.4ML ~~LOC~~ SOLN
40.0000 mg | SUBCUTANEOUS | Status: DC
  Administered 2015-08-07 – 2015-08-08 (×2): 40 mg via SUBCUTANEOUS
  Filled 2015-08-07 (×3): qty 0.4

## 2015-08-07 MED ORDER — ONDANSETRON HCL 4 MG/2ML IJ SOLN: 4.0000 mg | Freq: Four times a day (QID) | INTRAMUSCULAR | Status: DC | PRN

## 2015-08-07 MED ORDER — SODIUM CHLORIDE 0.9 % IV SOLN
Freq: Once | INTRAVENOUS | Status: AC
  Administered 2015-08-07: 19:00:00 via INTRAVENOUS

## 2015-08-07 MED ORDER — SODIUM CHLORIDE 0.9 % IV BOLUS (SEPSIS)
500.0000 mL | Freq: Once | INTRAVENOUS | Status: AC
  Administered 2015-08-07: 500 mL via INTRAVENOUS

## 2015-08-07 NOTE — Progress Notes (Signed)
Attempted to get report from ED RN 

## 2015-08-07 NOTE — H&P (Signed)
History and Physical    Justin Holloway [id number] DOB: July 05, 1929 DOA: 08/07/2015  Referring MD/NP/PA:   PCP: [name], MD   Patient coming from:  The patient is coming from home.  At baseline, pt is independent for most of ADL.   Chief Complaint: dizziness and burning on urination  HPI: Justin Holloway is a 82 y.o. male with medical history significant of hypertension, hyperlipidemia, diabetes mellitus, lacunar stroke, arthritis, CKD-III, who presents with dizziness and a burning on urination.  Pt state that he had intermittently dizziness over the last few years. He had an MRI in January 2016 showing a left thalamic small infarct. He states his doctor told him that his dizziness was from "mini strokes". Pt states that his dizziness has been worsening in the past 2 weeks. He also has mild blurry vision, no ear ringing. No unilateral weakness, numbness or tingling in extremities. He does not feel like room spinning around him. He also reports intermittent burning on urination and increased urinary frequency (patient is on Lasix), denies dysuria. Patient does not have chest pain, shortness of breath, palpitation, nausea, vomiting, diarrhea, abdominal pain, fever, chills, rashes.  ED Course: pt was found to have positive urinalysis with large amount of leukocytes and positive nitrates, WBC 7.2, temperature normal, transient tachycardia, potassium 5.8 without T-wave peaking, worsening renal function. Pt is placed on tele bed for obs.  # MRI-brain showed: 1. stable planum sphenoidale meningioma; 2. No acute or focal abnormality to explain the patient's symptoms.  Remote lacunar infarct of the left thalamus. 4. Mild atrophy and white matter changes otherwise within normal limits for age.  Review of Systems:   General: no fevers, chills, no changes in body weight, has fatigue HEENT: no blurry vision, hearing changes or sore throat Pulm: no dyspnea, coughing, wheezing CV: no chest  pain, no palpitations Abd: no nausea, vomiting, abdominal pain, diarrhea, constipation GU: no dysuria, has burning on urination, increased urinary frequency, no hematuria  Ext: has mild leg edema Neuro: no unilateral weakness, numbness, or tingling, or hearing loss. Has dizziness and blurry vision. Skin: no rash MSK: No muscle spasm, no deformity, no limitation of range of movement in spin Heme: No easy bruising.  Travel history: No recent long distant travel.  Allergy: No Known Allergies  Past Medical History:  Diagnosis Date  . Arthritis   . CKD (chronic kidney disease), stage III   . Diabetes mellitus without complication ([place])   . Hyperlipidemia   . Hypertension     Past Surgical History:  Procedure Laterality Date  . INGUINAL HERNIA REPAIR Left 06/09/2013   Procedure: OPEN REPAIR LEFT INGUINAL HERNIA;  Surgeon: [name], MD;  Location: [facility];  Service: General;  Laterality: Left;  . INSERTION OF MESH Left 06/09/2013   Procedure: INSERTION OF MESH;  Surgeon: [name], MD;  Location: [facility];  Service: General;  Laterality: Left;    Social History:  reports that he has quit smoking. His smoking use included Cigars. He does not have any smokeless tobacco history on file. He reports that he does not drink alcohol or use drugs.  Family History:  Family History  Problem Relation Age of Onset  . Cancer Mother 72    breast  . COPD Father 13    Congestive heart failure     Prior to Admission medications   Medication Sig Start Date End Date Taking? Authorizing Provider  furosemide (LASIX) 20 MG tablet Take 20 mg by mouth.  Historical Provider, MD  glimepiride (AMARYL) 4 MG tablet Take 4 mg by mouth 2 (two) times daily.     Historical Provider, MD  losartan (COZAAR) 100 MG tablet  06/16/14   Historical Provider, MD  losartan-hydrochlorothiazide [facility]) 100-12.5 MG per tablet  06/04/13   Historical Provider, MD  metFORMIN (GLUCOPHAGE) 1000 MG tablet Take 1,000-1,500  mg by mouth 2 (two) times daily with a meal. Take 1 1/2 tablet by mouth (1500 mg) in the morning and 1 tablet (1000 mg) in the evening.    Historical Provider, MD  pravastatin (PRAVACHOL) 40 MG tablet Take 40 mg by mouth daily.    Historical Provider, MD    Physical Exam: Vitals:   08/07/15 1316 08/07/15 1600 08/07/15 1800  BP: 124/74 129/78 134/76  Pulse: 102 80 82  Resp: 16 16 15   Temp: 97.5 F (36.4 C) 97.5 F (36.4 C)   TempSrc: Oral    SpO2: 97% 99% 98%   General: Not in acute distress. Dry mucus membrane. HEENT:       Eyes: PERRL, EOMI, no scleral icterus.       ENT: No discharge from the ears and nose, no pharynx injection, no tonsillar enlargement.        Neck: No JVD, no bruit, no mass felt. Heme: No neck lymph node enlargement. Cardiac: S1/S2, RRR, No murmurs, No gallops or rubs. Pulm: No rales, wheezing, rhonchi or rubs. Abd: Soft, nondistended, nontender, no rebound pain, no organomegaly, BS present. GU: No hematuria Ext: has trace leg edema bilaterally. 2+DP/PT pulse bilaterally. Musculoskeletal: No joint deformities, No joint redness or warmth, no limitation of ROM in spin. Skin: No rashes.  Neuro: Alert, oriented X3, cranial nerves II-XII grossly intact, moves all extremities normally. Muscle strength 5/5 in all extremities, sensation to light touch intact. Brachial reflex 2+ bilaterally.  Negative Babinski's sign.  Psych: Patient is not psychotic, no suicidal or hemocidal ideation.  Labs on Admission: I have personally reviewed following labs and imaging studies  CBC:  Recent Labs Lab 08/07/15 1330  WBC 7.2  HGB 11.6*  HCT 34.9*  MCV 87.7  PLT [id number]   Basic Metabolic Panel:  Recent Labs Lab 08/07/15 1330  NA 134*  K 5.8*  CL 111  CO2 16*  GLUCOSE 261*  BUN 46*  CREATININE 2.35*  CALCIUM 9.4   GFR: CrCl cannot be calculated (Unknown ideal weight.). Liver Function Tests: No results for input(s): AST, ALT, ALKPHOS, BILITOT, PROT, ALBUMIN in the  last 168 hours. No results for input(s): LIPASE, AMYLASE in the last 168 hours. No results for input(s): AMMONIA in the last 168 hours. Coagulation Profile: No results for input(s): INR, PROTIME in the last 168 hours. Cardiac Enzymes: No results for input(s): CKTOTAL, CKMB, CKMBINDEX, TROPONINI in the last 168 hours. BNP (last 3 results) No results for input(s): PROBNP in the last 8760 hours. HbA1C: No results for input(s): HGBA1C in the last 72 hours. CBG:  Recent Labs Lab 08/07/15 1329  GLUCAP 281*   Lipid Profile: No results for input(s): CHOL, HDL, LDLCALC, TRIG, CHOLHDL, LDLDIRECT in the last 72 hours. Thyroid Function Tests: No results for input(s): TSH, T4TOTAL, FREET4, T3FREE, THYROIDAB in the last 72 hours. Anemia Panel: No results for input(s): VITAMINB12, FOLATE, FERRITIN, TIBC, IRON, RETICCTPCT in the last 72 hours. Urine analysis:    Component Value Date/Time   COLORURINE YELLOW 08/07/2015 1831   APPEARANCEUR CLOUDY (A) 08/07/2015 1831   LABSPEC 1.020 08/07/2015 1831   PHURINE 5.0 08/07/2015 1831  GLUCOSEU 100 (A) 08/07/2015 1831   HGBUR NEGATIVE 08/07/2015 1831   BILIRUBINUR NEGATIVE 08/07/2015 1831   KETONESUR NEGATIVE 08/07/2015 1831   PROTEINUR NEGATIVE 08/07/2015 1831   UROBILINOGEN 0.2 06/04/2013 1313   NITRITE POSITIVE (A) 08/07/2015 1831   LEUKOCYTESUR LARGE (A) 08/07/2015 1831   Sepsis Labs: @LABRCNTIP (procalcitonin:4,lacticidven:4) )No results found for this or any previous visit (from the past 240 hour(s)).   Radiological Exams on Admission: Mr Brain Wo Contrast  Result Date: 08/07/2015 CLINICAL DATA:  Progressive gait and balance abnormalities over the last 3-6 months. Episode of slurred speech lasting for 3 weeks 3 months ago. EXAM: MRI HEAD WITHOUT CONTRAST TECHNIQUE: Multiplanar, multiecho pulse sequences of the brain and surrounding structures were obtained without intravenous contrast. COMPARISON:  MRI brain 02/04/2014. FINDINGS:  Diffusion-weighted images demonstrate no evidence for acute or subacute infarction. There is subtle increased diffusion trace signal within the planum sphenoidale meningioma. The plan is sphenoid ally meningioma is stable measuring 2.8 cm in transverse diameter. The lesion is anterior to the optic chiasm. Midline structures are otherwise within normal limits. Mild periventricular T2 changes are stable bilaterally. A remote lacunar infarct of the left thalamus is again noted. Remote ischemic changes are present in the right thalamus. Brainstem and cerebellum are within normal limits. The internal auditory canals are within normal limits bilaterally. Flow is present in the major intracranial arteries. The globes and orbits are intact. Mild mucosal thickening scattered throughout the paranasal sinuses is similar to the prior study. The skullbase is within normal limits. IMPRESSION: 1. Stable planum sphenoidale meningioma. 2. No acute or focal abnormality to explain the patient's symptoms. 3. Remote lacunar infarct of the left thalamus. 4. Mild atrophy and white matter changes otherwise within normal limits for age. Electronically Signed   By: San Morelle M.D.   On: 08/07/2015 18:28    EKG: Independently reviewed. Sinus rhythm, QTC 441, no T-wave peaking, mild T-wave inversion lateral leads, early R-wave progression.  Assessment/Plan Principal Problem:   UTI (lower urinary tract infection) Active Problems:   Diabetes mellitus without complication (HCC)   HLD (hyperlipidemia)   HYPERTENSION, BENIGN SYSTEMIC   Dizziness   Acute renal failure superimposed on stage 3 chronic kidney disease (HCC)   Hyperkalemia   UTI (lower urinary tract infection): Patient has burning on urination and increasing urinary frequency, plus positive urinalysis, consistent with UTI. Patient is not septic. Hemodynamically stable.  - will place on telemetry bed for obs - Ceftriaxone by IV dialy - Follow up results of  urine and blood cx and amend antibiotic regimen if needed per sensitivity results -  IVF: 500 mL of NS bolus in ED, followed by 75 cc/h  Dizziness: No acute stroke by MRI-brain. His dizziness is is likely due to multifactorial etiologies, including UTI, orthostatic status secondary to Lasix use, and lacunar stroke. -treat UTI as above -check ortho vital signs -gentle IVF as above -PT/OT  DM-II: Last A1c not on record. Patient is taking Toujeo, Amaryl and metformin at home. CBG 261 -will decrease Toujeo dose from 10 to 8 units daily -SSI -Check A1c  HLD: Last LDL was not on record -Continue home medications: pravastatin -Check FLP  Acute renal failure superimposed on stage 3 chronic kidney disease ([place]): Baseline creatinine 1.6-1.7. His creatinine is at 2.35, BUN 46. Likely due to prerenal secondary to dehydration and continuation of ARB, diruetics - IVF as above - Check  FeUrea - Follow up renal function by BMP - Hold lasix and Edarbi  Hyperkalemia: K=5.8  without T wave peaking on EKG.  -Kayexalate 30 g 1 -Follow-up by BMP  HYPERTENSION: Bp 134/76. Pt is not very clear if he is taking lasix or not since he told Pharm and me differently. -will hold Lasix and Edarbi due to worsening renal function -IV hydralazine when necessary -start amlodipine 5 mg daily  Planum sphenoidale meningioma: stable on MRI-brain -f/u with PCP   DVT ppx: SQ Lovenox Code Status: Full code Family Communication: Yes, patient's wife at bed side Disposition Plan:  Anticipate discharge back to previous home environment Consults called:  none Admission status: Obs / tele   Date of Service 08/07/2015    Ivor Costa [facility] Pager [phone]  If 7PM-7AM, please contact night-coverage [email or web address] Password [email or web address] 08/07/2015, 8:20 PM

## 2015-08-07 NOTE — ED Notes (Signed)
Pt. Transported to MRI at this time.  

## 2015-08-07 NOTE — ED Provider Notes (Signed)
[facility] DEPT Provider Note   CSN: [id number] Arrival date & time: 08/07/15  1312  First Provider Contact:  First MD Initiated Contact with Patient 08/07/15 1611        History   Chief Complaint Chief Complaint  Patient presents with  . Dizziness    HPI Justin Holloway is a 82 y.o. male.  He presents via private conveyance with his wife. His main complaint is dizziness. He states she's had intermittently over the last few years. He had an MRI in January 2016 showing a left thalamic small infarct. He states his doctor told him that his dizziness was from "mini strokes".  He states over the last 2-3 weeks this is been worse. He states that when he stands he feels like his" balance Or my blood pressure is off", . He has not fallen. He has not had headache nausea vomiting or sudden acute symptoms. He does not have orthostasis.[name]  History of diabetes. On insulin only for the last few weeks. Hypertension.  No headache. No change in medications other than insulin. No palpitations.  HPI  Past Medical History:  Diagnosis Date  . Arthritis   . Diabetes mellitus without complication ([place])   . Hyperlipidemia   . Hypertension     Patient Active Problem List   Diagnosis Date Noted  . Left inguinal hernia 05/15/2013  . DIABETES MELLITUS II, UNCOMPLICATED [id number]  . HYPERLIPIDEMIA 03/08/2006  . HYPERTENSION, BENIGN SYSTEMIC 03/08/2006  . 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 PROTEINURIA 03/08/2006    Past Surgical History:  Procedure Laterality Date  . INGUINAL HERNIA REPAIR Left 06/09/2013   Procedure: OPEN REPAIR LEFT INGUINAL HERNIA;  Surgeon: [name], MD;  Location: [employer];  Service: General;  Laterality: Left;  . INSERTION OF MESH Left 06/09/2013   Procedure: INSERTION OF MESH;  Surgeon: [name], MD;  Location: [facility];  Service: General;  Laterality: Left;       Home Medications    Prior to Admission medications   Medication Sig Start Date End Date  Taking? Authorizing Provider  furosemide (LASIX) 20 MG tablet Take 20 mg by mouth.    Historical Provider, MD  glimepiride (AMARYL) 4 MG tablet Take 4 mg by mouth 2 (two) times daily.     Historical Provider, MD  losartan (COZAAR) 100 MG tablet  06/16/14   Historical Provider, MD  losartan-hydrochlorothiazide [facility]) 100-12.5 MG per tablet  06/04/13   Historical Provider, MD  metFORMIN (GLUCOPHAGE) 1000 MG tablet Take 1,000-1,500 mg by mouth 2 (two) times daily with a meal. Take 1 1/2 tablet by mouth (1500 mg) in the morning and 1 tablet (1000 mg) in the evening.    Historical Provider, MD  pravastatin (PRAVACHOL) 40 MG tablet Take 40 mg by mouth daily.    Historical Provider, MD    Family History Family History  Problem Relation Age of Onset  . Cancer Mother 60    breast  . COPD Father 43    Congestive heart failure    Social History Social History  Substance Use Topics  . Smoking status: Former Smoker    Types: Cigars  . Smokeless tobacco: Not on file  . Alcohol use No     Allergies   Review of patient's allergies indicates no known allergies.   Review of Systems Review of Systems  Constitutional: Negative for appetite change, chills, diaphoresis, fatigue and fever.  HENT: Negative for mouth sores, sore throat and trouble swallowing.   Eyes: Negative for  visual disturbance.  Respiratory: Negative for cough, chest tightness, shortness of breath and wheezing.   Cardiovascular: Negative for chest pain.  Gastrointestinal: Negative for abdominal distention, abdominal pain, diarrhea, nausea and vomiting.  Endocrine: Negative for polydipsia, polyphagia and polyuria.  Genitourinary: Negative for dysuria, frequency and hematuria.  Musculoskeletal: Negative for gait problem.  Skin: Negative for color change, pallor and rash.  Neurological: Positive for dizziness. Negative for syncope, light-headedness and headaches.  Hematological: Does not bruise/bleed easily.    Psychiatric/Behavioral: Negative for behavioral problems and confusion.     Physical Exam Updated Vital Signs BP 134/76 (BP Location: Right Arm)   Pulse 82   Temp 97.5 F (36.4 C)   Resp 15   SpO2 98%   Physical Exam  Constitutional: He is oriented to person, place, and time. He appears well-developed and well-nourished. No distress.  HENT:  Head: Normocephalic.  Eyes: Conjunctivae are normal. Pupils are equal, round, and reactive to light. No scleral icterus.  Neck: Normal range of motion. Neck supple. No thyromegaly present.  Cardiovascular: Normal rate and regular rhythm.  Exam reveals no gallop and no friction rub.   No murmur heard. Pulmonary/Chest: Effort normal and breath sounds normal. No respiratory distress. He has no wheezes. He has no rales.  Abdominal: Soft. Bowel sounds are normal. He exhibits no distension. There is no tenderness. There is no rebound.  Musculoskeletal: Normal range of motion.  Neurological: He is alert and oriented to person, place, and time.  Symmetric cranial nerves. No nystagmus. No visual field cuts. No bruits. He is in a sinus rhythm. No pronator drift no leg drift. I did not ambulate him initially  Skin: Skin is warm and dry. No rash noted.  Psychiatric: He has a normal mood and affect. His behavior is normal.     ED Treatments / Results  Labs (all labs ordered are listed, but only abnormal results are displayed) Labs Reviewed  BASIC METABOLIC PANEL - Abnormal; Notable for the following:       Result Value   Sodium 134 (*)    Potassium 5.8 (*)    CO2 16 (*)    Glucose, Bld 261 (*)    BUN 46 (*)    Creatinine, Ser 2.35 (*)    GFR calc non Af Amer 24 (*)    GFR calc Af Amer 28 (*)    All other components within normal limits  CBC - Abnormal; Notable for the following:    RBC 3.98 (*)    Hemoglobin 11.6 (*)    HCT 34.9 (*)    All other components within normal limits  URINALYSIS, ROUTINE W REFLEX MICROSCOPIC (NOT AT [facility]) -  Abnormal; Notable for the following:    APPearance CLOUDY (*)    Glucose, UA 100 (*)    Nitrite POSITIVE (*)    Leukocytes, UA LARGE (*)    All other components within normal limits  URINE MICROSCOPIC-ADD ON - Abnormal; Notable for the following:    Squamous Epithelial / LPF 0-5 (*)    Bacteria, UA MANY (*)    All other components within normal limits  CBG MONITORING, ED - Abnormal; Notable for the following:    Glucose-Capillary 281 (*)    All other components within normal limits  I-STAT TROPOININ, ED    EKG  EKG Interpretation  Date/Time:  Saturday August 07 2015 13:22:30 EDT Ventricular Rate:  106 PR Interval:  190 QRS Duration: 80 QT Interval:  332 QTC Calculation: 441 R Axis:  24 Text Interpretation:  Sinus tachycardia Nonspecific ST and T wave abnormality Abnormal ECG Confirmed by Jeneen Rinks  MD, [place] ([id number]) on 08/07/2015 6:13:27 PM       Radiology Mr Brain Wo Contrast  Result Date: 08/07/2015 CLINICAL DATA:  Progressive gait and balance abnormalities over the last 3-6 months. Episode of slurred speech lasting for 3 weeks 3 months ago. EXAM: MRI HEAD WITHOUT CONTRAST TECHNIQUE: Multiplanar, multiecho pulse sequences of the brain and surrounding structures were obtained without intravenous contrast. COMPARISON:  MRI brain 02/04/2014. FINDINGS: Diffusion-weighted images demonstrate no evidence for acute or subacute infarction. There is subtle increased diffusion trace signal within the planum sphenoidale meningioma. The plan is sphenoid ally meningioma is stable measuring 2.8 cm in transverse diameter. The lesion is anterior to the optic chiasm. Midline structures are otherwise within normal limits. Mild periventricular T2 changes are stable bilaterally. A remote lacunar infarct of the left thalamus is again noted. Remote ischemic changes are present in the right thalamus. Brainstem and cerebellum are within normal limits. The internal auditory canals are within normal limits  bilaterally. Flow is present in the major intracranial arteries. The globes and orbits are intact. Mild mucosal thickening scattered throughout the paranasal sinuses is similar to the prior study. The skullbase is within normal limits. IMPRESSION: 1. Stable planum sphenoidale meningioma. 2. No acute or focal abnormality to explain the patient's symptoms. 3. Remote lacunar infarct of the left thalamus. 4. Mild atrophy and white matter changes otherwise within normal limits for age. Electronically Signed   By: San Morelle M.D.   On: 08/07/2015 18:28   Procedures Procedures (including critical care time)  Medications Ordered in ED Medications  0.9 %  sodium chloride infusion (not administered)  cefTRIAXone (ROCEPHIN) 1 g in dextrose 5 % 50 mL IVPB (not administered)  meclizine (ANTIVERT) tablet 25 mg (25 mg Oral Given 08/07/15 1615)  sodium chloride 0.9 % bolus 500 mL (500 mLs Intravenous New Bag/Given 08/07/15 1857)     Initial Impression / Assessment and Plan / ED Course  I have reviewed the triage vital signs and the nursing notes.  Pertinent labs & imaging results that were available during my care of the patient were reviewed by me and considered in my medical decision making (see chart for details).  Clinical Course    Describes vertigo. Peripheral versus central. MRI requested given by mouth Antivert.  Also describes lightheadedness. We'll plan fluids, lab evaluation. We'll reassess.  Final Clinical Impressions(s) / ED Diagnoses   Final diagnoses:  UTI (lower urinary tract infection)  Acute kidney injury ([place])  Hyperkalemia    MRI shows no acute changes. Potassium high at 5.8. Acute kidney injury with 2.35 creatinine and BUN 46. Mild metabolic acidosis with bicarbonate 16, hyperglycemic. + UTI.  Given 500 fluids and continued on 100/h. Given IV Rocephin. He has no EKG changes with his elevated potassium. Considering his age, UTI, elevated K+, I will discuss observation  admission with hospitalist for hydration and repeat labs and antibiotics  New Prescriptions New Prescriptions   No medications on file     Tanna Furry, MD 08/07/15 1914

## 2015-08-07 NOTE — Progress Notes (Signed)
Received report from ED RN Anderson Malta.

## 2015-08-08 ENCOUNTER — Encounter (HOSPITAL_COMMUNITY): Payer: Self-pay

## 2015-08-08 DIAGNOSIS — N179 Acute kidney failure, unspecified: Secondary | ICD-10-CM | POA: Diagnosis not present

## 2015-08-08 DIAGNOSIS — R42 Dizziness and giddiness: Secondary | ICD-10-CM | POA: Diagnosis not present

## 2015-08-08 DIAGNOSIS — N183 Chronic kidney disease, stage 3 (moderate): Secondary | ICD-10-CM | POA: Diagnosis not present

## 2015-08-08 DIAGNOSIS — N390 Urinary tract infection, site not specified: Secondary | ICD-10-CM | POA: Diagnosis not present

## 2015-08-08 DIAGNOSIS — E119 Type 2 diabetes mellitus without complications: Secondary | ICD-10-CM | POA: Diagnosis not present

## 2015-08-08 DIAGNOSIS — E875 Hyperkalemia: Secondary | ICD-10-CM | POA: Diagnosis not present

## 2015-08-08 LAB — LIPID PANEL
Cholesterol: 131 mg/dL (ref 0–200)
HDL: 33 mg/dL — AB (ref >40)
LDL CALC: 81 mg/dL (ref 0–99)
TRIGLYCERIDES: 83 mg/dL (ref <150)
Total CHOL/HDL Ratio: 4 RATIO
VLDL: 17 mg/dL (ref 0–40)

## 2015-08-08 LAB — BASIC METABOLIC PANEL
ANION GAP: 3 — AB (ref 5–15)
BUN: 37 mg/dL — ABNORMAL HIGH (ref 6–20)
CALCIUM: 9.3 mg/dL (ref 8.9–10.3)
CO2: 21 mmol/L — AB (ref 22–32)
CREATININE: 2.08 mg/dL — AB (ref 0.61–1.24)
Chloride: 115 mmol/L — ABNORMAL HIGH (ref 101–111)
GFR, EST AFRICAN AMERICAN: 32 mL/min — AB (ref >60)
GFR, EST NON AFRICAN AMERICAN: 28 mL/min — AB (ref >60)
GLUCOSE: 161 mg/dL — AB (ref 65–99)
Potassium: 5.1 mmol/L (ref 3.5–5.1)
Sodium: 139 mmol/L (ref 135–145)

## 2015-08-08 LAB — CBC
HCT: 31.4 % — ABNORMAL LOW (ref 39.0–52.0)
HEMOGLOBIN: 10 g/dL — AB (ref 13.0–17.0)
MCH: 28.2 pg (ref 26.0–34.0)
MCHC: 31.8 g/dL (ref 30.0–36.0)
MCV: 88.7 fL (ref 78.0–100.0)
PLATELETS: 201 10*3/uL (ref 150–400)
RBC: 3.54 MIL/uL — ABNORMAL LOW (ref 4.22–5.81)
RDW: 13.6 % (ref 11.5–15.5)
WBC: 6.2 10*3/uL (ref 4.0–10.5)

## 2015-08-08 LAB — GLUCOSE, CAPILLARY
GLUCOSE-CAPILLARY: 175 mg/dL — AB (ref 65–99)
Glucose-Capillary: 130 mg/dL — ABNORMAL HIGH (ref 65–99)
Glucose-Capillary: 152 mg/dL — ABNORMAL HIGH (ref 65–99)
Glucose-Capillary: 137 mg/dL — ABNORMAL HIGH (ref 65–99)

## 2015-08-08 LAB — BRAIN NATRIURETIC PEPTIDE: B Natriuretic Peptide: 32.3 pg/mL (ref 0.0–100.0)

## 2015-08-08 MED ORDER — SODIUM CHLORIDE 0.9 % IV SOLN
INTRAVENOUS | Status: DC
  Administered 2015-08-08 – 2015-08-09 (×2): via INTRAVENOUS

## 2015-08-08 NOTE — Evaluation (Signed)
Physical Therapy Evaluation Patient Details Name: Justin Holloway MRN: [id number] DOB: [birth year] Today's Date: 08/08/2015   History of Present Illness  82 y.o. male with medical history significant of hypertension, hyperlipidemia, diabetes mellitus, lacunar stroke, arthritis, CKD-III, who presents with dizziness and a burning on urination. Pt admitted for UTI.  Clinical Impression  Pt admitted with above diagnosis. Pt currently with functional limitations due to the deficits listed below (see PT Problem List). On eval, pt required min guard assist for transfers and gait. He reports he had just started using a cane for ambulation approx 2 week prior to hospitalization. Pt will benefit from skilled PT to increase their independence and safety with mobility to allow discharge to the venue listed below. PT to follow acutely. No follow up services indicated.      Follow Up Recommendations No PT follow up;Supervision - Intermittent    Equipment Recommendations  None recommended by PT    Recommendations for Other Services       Precautions / Restrictions Precautions Precautions: Fall      Mobility  Bed Mobility               General bed mobility comments: Pt received in recliner. Pt reports independence with bed mobility.  Transfers Overall transfer level: Needs assistance Equipment used: None Transfers: Sit to/from [facility] Sit to Stand: Min guard Stand pivot transfers: Min guard       General transfer comment: Pt able to independently stabilize initial standing balance. Min guard assist for safety only.  Ambulation/Gait Ambulation/Gait assistance: Min guard Ambulation Distance (Feet): 150 Feet Assistive device: 1 person hand held assist Gait Pattern/deviations: Step-through pattern;Decreased stride length Gait velocity: decreased   General Gait Details: Slow, steady gait. No LOB noted.  Stairs            Wheelchair Mobility     Modified Rankin (Stroke Patients Only)       Balance Overall balance assessment: Needs assistance Sitting-balance support: Feet supported;No upper extremity supported Sitting balance-Leahy Scale: Good     Standing balance support: During functional activity;Single extremity supported Standing balance-Leahy Scale: Fair                               Pertinent Vitals/Pain Pain Assessment: No/denies pain    Home Living Family/patient expects to be discharged to:: Private residence Living Arrangements: Spouse/significant other Available Help at Discharge: Family;Available 24 hours/day Type of Home: House Home Access: Level entry     Home Layout: One level Home Equipment: Cane - single point      Prior Function Level of Independence: Independent with assistive device(s)               Hand Dominance        Extremity/Trunk Assessment   Upper Extremity Assessment: Defer to OT evaluation           Lower Extremity Assessment: Overall WFL for tasks assessed      Cervical / Trunk Assessment: Normal  Communication   Communication: No difficulties  Cognition Arousal/Alertness: Awake/alert Behavior During Therapy: WFL for tasks assessed/performed Overall Cognitive Status: Within Functional Limits for tasks assessed                      General Comments      Exercises        Assessment/Plan    PT Assessment Patient needs continued PT services  PT Diagnosis  Difficulty walking   PT Problem List Decreased activity tolerance;Decreased balance;Decreased mobility;Decreased knowledge of use of DME  PT Treatment Interventions DME instruction;Gait training;Functional mobility training;Therapeutic activities;Therapeutic exercise;Balance training;Patient/family education   PT Goals (Current goals can be found in the Care Plan section) Acute Rehab PT Goals Patient Stated Goal: home PT Goal Formulation: With patient Time For Goal  Achievement: 08/22/15 Potential to Achieve Goals: Good    Frequency Min 3X/week   Barriers to discharge        Co-evaluation               End of Session Equipment Utilized During Treatment: Gait belt Activity Tolerance: Patient tolerated treatment well Patient left: in chair;with family/visitor present;with call bell/phone within reach Nurse Communication: Mobility status    Functional Assessment Tool Used: clinical judgement Functional Limitation: Mobility: Walking and moving around Mobility: Walking and Moving Around Current Status [phone]): At least 1 percent but less than 20 percent impaired, limited or restricted Mobility: Walking and Moving Around Goal Status [phone]): At least 1 percent but less than 20 percent impaired, limited or restricted    Time: 1401-1418 PT Time Calculation (min) (ACUTE ONLY): 17 min   Charges:   PT Evaluation $PT Eval Moderate Complexity: 1 Procedure     PT G Codes:   PT G-Codes **NOT FOR INPATIENT CLASS** Functional Assessment Tool Used: clinical judgement Functional Limitation: Mobility: Walking and moving around Mobility: Walking and Moving Around Current Status [id number]): At least 1 percent but less than 20 percent impaired, limited or restricted Mobility: Walking and Moving Around Goal Status [phone]): At least 1 percent but less than 20 percent impaired, limited or restricted    Lorriane Shire 08/08/2015, 3:15 PM

## 2015-08-08 NOTE — Evaluation (Addendum)
Occupational Therapy Evaluation and Discharge Patient Details Name: Justin Holloway MRN: [id number] DOB: July 25, 1929 Today's Date: 08/08/2015    History of Present Illness 82 y.o. male with medical history significant of hypertension, hyperlipidemia, diabetes mellitus, lacunar stroke, arthritis, CKD-III, who presents with dizziness and a burning on urination. Pt admitted for UTI.   Clinical Impression   Pt reports he was independent with ADL PTA. Currently pt overall min guard for safety with ADL and functional mobility due to balance deficits during functional activities. Educated pt and family on home safety and fall prevention strategies. Recommending use of 3 in 1 over toilet and supervision for tub transfers. Feel pt would highly benefit from a tub bench and grab bars in his shower upon return home for safety with tub transfers and bathing. Pt planning to d/c home with 24/7 supervision from family. No further acute OT needs identified; signing off at this time. Please re-consult if needs change. Thank you for this referral.    Follow Up Recommendations  No OT follow up;Supervision/Assistance - 24 hour    Equipment Recommendations  Tub/shower bench;Other (comment) (Grab bars in shower)   Recommendations for Other Services       Precautions / Restrictions Precautions Precautions: Fall Restrictions Weight Bearing Restrictions: No      Mobility Bed Mobility Overal bed mobility: Needs Assistance Bed Mobility: Supine to Sit;Sit to Supine     Supine to sit: Supervision Sit to supine: Supervision   General bed mobility comments: Supervision for safety. No physical assist required but increased time and effort needed.  Transfers Overall transfer level: Needs assistance Equipment used: None Transfers: Sit to/from Stand Sit to Stand: Min guard Stand pivot transfers: Min guard       General transfer comment: Min guard for safety. Pt slightly unsteady on feet especially  with higher level balance activities.    Balance Overall balance assessment: Needs assistance Sitting-balance support: Feet supported;No upper extremity supported Sitting balance-Leahy Scale: Good     Standing balance support: No upper extremity supported;During functional activity Standing balance-Leahy Scale: Fair                              ADL Overall ADL's : Needs assistance/impaired Eating/Feeding: Independent;Sitting   Grooming: Min guard;Standing;Wash/dry hands   Upper Body Bathing: Supervision/ safety;Sitting   Lower Body Bathing: Min guard;Sit to/from stand   Upper Body Dressing : Set up;Supervision/safety;Sitting Upper Body Dressing Details (indicate cue type and reason): to don hospital gown Lower Body Dressing: Min guard;Sit to/from stand Lower Body Dressing Details (indicate cue type and reason): Pt able to pull up socks sitting EOB Toilet Transfer: Min guard;Ambulation;Regular Toilet;Grab bars Toilet Transfer Details (indicate cue type and reason): Attempted toilet transfer without use of grab bars to simulate home environment; pt with difficulty performing. Toileting- Water quality scientist and Hygiene: Min guard;Sit to/from stand Toileting - Clothing Manipulation Details (indicate cue type and reason): For peri care in standing Tub/ Shower Transfer: Min guard;Ambulation;Shower seat;Tub Product manager Details (indicate cue type and reason): Simulated tub transfer with pt holding onto wall for support. Pt unsteady but no LOB.  Functional mobility during ADLs: Min guard General ADL Comments: Educated pt and family on fall prevention and home safety strategies. Recommend use of 3 in 1 over toilet for handles and raising toilet. Also recommend close supervision for tub transfers due to unsteadiness. Educted pt and wife on tub bench for increased safety with tub transfers; they  are interested but do not know if they can afford one if insurance  doesnt cover.      Vision Additional Comments: Appears WFL.   Perception     Praxis      Pertinent Vitals/Pain Pain Assessment: No/denies pain     Hand Dominance Right   Extremity/Trunk Assessment Upper Extremity Assessment Upper Extremity Assessment: Overall WFL for tasks assessed   Lower Extremity Assessment Lower Extremity Assessment: Defer to PT evaluation   Cervical / Trunk Assessment Cervical / Trunk Assessment: Normal   Communication Communication Communication: No difficulties   Cognition Arousal/Alertness: Awake/alert Behavior During Therapy: WFL for tasks assessed/performed Overall Cognitive Status: Within Functional Limits for tasks assessed                     General Comments       Exercises       Shoulder Instructions      Home Living Family/patient expects to be discharged to:: Private residence Living Arrangements: Spouse/significant other Available Help at Discharge: Family;Available 24 hours/day Type of Home: House Home Access: Level entry     Home Layout: One level     Bathroom Shower/Tub: Tub/shower unit Shower/tub characteristics: Curtain Biochemist, clinical: Standard     Home Equipment: Cane - single point;Shower seat;Bedside commode          Prior Functioning/Environment Level of Independence: Independent with assistive device(s)             OT Diagnosis: Generalized weakness   OT Problem List:     OT Treatment/Interventions:      OT Goals(Current goals can be found in the care plan section) Acute Rehab OT Goals Patient Stated Goal: go home OT Goal Formulation: All assessment and education complete, DC therapy  OT Frequency:     Barriers to D/C:            Co-evaluation              End of Session Equipment Utilized During Treatment: Gait belt Nurse Communication: Mobility status;Other (comment) (pt had BM, condom cath coming off)  Activity Tolerance: Patient tolerated treatment well Patient  left: in bed;with call bell/phone within reach;with family/visitor present;with bed alarm set   Time: [id number] OT Time Calculation (min): 31 min Charges:  OT General Charges $OT Visit: 1 Procedure OT Evaluation $OT Eval Moderate Complexity: 1 Procedure OT Treatments $Self Care/Home Management : 8-22 mins G-Codes: OT G-codes **NOT FOR INPATIENT CLASS** Functional Assessment Tool Used: Clinical judgement Functional Limitation: Self care Self Care Current Status [id number]): At least 1 percent but less than 20 percent impaired, limited or restricted Self Care Goal Status [id number]): At least 1 percent but less than 20 percent impaired, limited or restricted Self Care Discharge Status [phone]): At least 1 percent but less than 20 percent impaired, limited or restricted   Binnie Kand M.S., OTR/L Pager: [phone]  08/08/2015, 4:24 PM

## 2015-08-08 NOTE — Progress Notes (Signed)
Orthostatic vital signs  Lying: BP 136/72  HR 87 Sitting: BP 140/81  HR 87 Standing: BP 176/86  HR 95

## 2015-08-08 NOTE — Progress Notes (Signed)
Patient ID: MELCHOR DROWN, male   DOB: Oct 21, 1929, 82 y.o.   MRN: [id number]                                                                PROGRESS NOTE                                                                                                                                                                                                             Patient Demographics:    Justin Holloway, is a 82 y.o. male, DOB - [birth year], [id number]  Admit date - 08/07/2015   Admitting Physician Ivor Costa, MD  Outpatient Primary MD for the patient is [name], MD  LOS - 0  Outpatient Specialists:    Chief Complaint  Patient presents with  . Dizziness       Brief Narrative  82 y.o. male with medical history significant of hypertension, hyperlipidemia, diabetes mellitus, lacunar stroke, arthritis, CKD-III, who presents with dizziness and a burning on urination.  Pt state that he had intermittently dizziness over the last few years. He had an MRI in January 2016 showing a left thalamic small infarct. He states his doctor told him that his dizziness was from "mini strokes". Pt states that his dizziness has been worsening in the past 2 weeks. He also has mild blurry vision, no ear ringing. No unilateral weakness, numbness or tingling in extremities. He does not feel like room spinning around him. He also reports intermittent burning on urination and increased urinary frequency (patient is on Lasix), denies dysuria. Patient does not have chest pain, shortness of breath, palpitation, nausea, vomiting, diarrhea, abdominal pain, fever, chills, rashes.  ED Course: pt was found to have positive urinalysis with large amount of leukocytes and positive nitrates, WBC 7.2, temperature normal, transient tachycardia, potassium 5.8 without T-wave peaking, worsening renal function. Pt is placed on tele bed for obs.  # MRI-brain showed: 1. stable planum sphenoidale meningioma; 2. No acute or focal  abnormality to explain the patient's symptoms.  Remote lacunar infarct of the left thalamus. 4. Mild atrophy and white matter changes otherwise within normal limits for age.  Hospital course:  Pt admitted for ARF, on CRF.  Edarbi held. Metformin held.  Gentle hydration with ns iv.  Pt states not taking lasix for  the past 55month but apparently has had poor po intake.  Potassium  Improved.  K=5.1 (08/08/2015).     Subjective:    Justin Holloway today has been feeling better.  No headache, No chest pain, No abdominal pain - No Nausea, No new weakness tingling or numbness, No Cough - SOB.    Assessment  & Plan :    Principal Problem:   UTI (lower urinary tract infection) Active Problems:   Diabetes mellitus without complication (HCC)   HLD (hyperlipidemia)   HYPERTENSION, BENIGN SYSTEMIC   Dizziness   Acute renal failure superimposed on stage 3 chronic kidney disease (HCC)   Hyperkalemia  UTI (lower urinary tract infection):  Cont rocephin daily  Dizziness: No acute stroke by MRI-brain. His dizziness is is likely due to multifactorial etiologies, including UTI, orthostatic status secondary to Lasix use, and lacunar stroke. -treat UTI as above -check ortho vital signs -gentle IVF as above -PT/OT  DM-II: Last A1c not on record. Patient is taking Toujeo, Amaryl and metformin at home. CBG 261 Stop metformin -SSI  HLD: Last LDL was not on record -Continue home medications: pravastatin -Check FLP  Acute renal failure superimposed on stage 3 chronic kidney disease ([place]): Baseline creatinine 1.6-1.7. His creatinine is at 2.35, BUN 46. 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 -f/u with PCP     Code  Status :  Full Code  Family Communication  :  w wife  Disposition Plan  : home  Barriers For Discharge :   Consults  :    Procedures  :  none  DVT Prophylaxis  :  Heparin - SCDs   Lab Results  Component Value Date   PLT 201 08/08/2015    Antibiotics  :    Anti-infectives    Start     Dose/Rate Route Frequency Ordered Stop   08/08/15 1930  cefTRIAXone (ROCEPHIN) 1 g in dextrose 5 % 50 mL IVPB     1 g 100 mL/hr over 30 Minutes Intravenous  Once 08/07/15 2004     08/07/15 1915  cefTRIAXone (ROCEPHIN) 1 g in dextrose 5 % 50 mL IVPB     1 g 100 mL/hr over 30 Minutes Intravenous  Once 08/07/15 1909 08/07/15 1950        Objective:   Vitals:   08/07/15 2240 08/07/15 2245 08/07/15 2249 08/08/15 0536  BP: 136/72 140/81 (!) 176/86 124/65  Pulse: 87 87 95 96  Resp: 16   16  Temp:    98.1 F (36.7 C)  TempSrc:    Oral  SpO2: 100% 97% 96% 100%  Weight:      Height:        Wt Readings from Last 3 Encounters:  08/07/15 96.4 kg (212 lb 9.6 oz)  06/17/14 97.5 kg (215 lb)  06/26/13 92.1 kg (203 lb)     Intake/Output Summary (Last 24 hours) at 08/08/15 1148 Last data filed at 08/08/15 1020  Gross per 24 hour  Intake              476 ml  Output              300 ml  Net  176 ml     Physical Exam  Awake Alert, Oriented X 3, No new F.N deficits, Normal affect [place].AT,PERRAL Supple Neck,No JVD, No cervical lymphadenopathy appriciated.  Symmetrical Chest wall movement, Good air movement bilaterally, CTAB RRR,No Gallops,Rubs or new Murmurs, No Parasternal Heave +ve B.Sounds, Abd Soft, No tenderness, No organomegaly appriciated, No rebound - guarding or rigidity. No Cyanosis, Clubbing or edema, No new Rash or bruise      Data Review:    CBC  Recent Labs Lab 08/07/15 1330 08/08/15 0614  WBC 7.2 6.2  HGB 11.6* 10.0*  HCT 34.9* 31.4*  PLT 214 201  MCV 87.7 88.7  MCH 29.1 28.2  MCHC 33.2 31.8  RDW 13.5 13.6    Chemistries   Recent Labs Lab  08/07/15 1330 08/08/15 0614  NA 134* 139  K 5.8* 5.1  CL 111 115*  CO2 16* 21*  GLUCOSE 261* 161*  BUN 46* 37*  CREATININE 2.35* 2.08*  CALCIUM 9.4 9.3   ------------------------------------------------------------------------------------------------------------------  Recent Labs  08/08/15 0614  CHOL 131  HDL 33*  LDLCALC 81  TRIG 83  CHOLHDL 4.0    No results found for: HGBA1C ------------------------------------------------------------------------------------------------------------------ No results for input(s): TSH, T4TOTAL, T3FREE, THYROIDAB in the last 72 hours.  Invalid input(s): FREET3 ------------------------------------------------------------------------------------------------------------------ No results for input(s): VITAMINB12, FOLATE, FERRITIN, TIBC, IRON, RETICCTPCT in the last 72 hours.  Coagulation profile No results for input(s): INR, PROTIME in the last 168 hours.  No results for input(s): DDIMER in the last 72 hours.  Cardiac Enzymes No results for input(s): CKMB, TROPONINI, MYOGLOBIN in the last 168 hours.  Invalid input(s): CK ------------------------------------------------------------------------------------------------------------------    Component Value Date/Time   BNP 32.3 08/08/2015 0621    Inpatient Medications  Scheduled Meds: . amLODipine  5 mg Oral Daily  . aspirin EC  81 mg Oral QPM  . cefTRIAXone (ROCEPHIN)  IV  1 g Intravenous Once  . cholecalciferol  1,000 Units Oral BID  . enoxaparin (LOVENOX) injection  40 mg Subcutaneous Q24H  . insulin aspart  0-9 Units Subcutaneous TID WC  . insulin glargine  8 Units Subcutaneous QHS  . multivitamin with minerals  1 tablet Oral Daily  . pravastatin  40 mg Oral QPM  . sodium chloride flush  3 mL Intravenous Q12H   Continuous Infusions:  PRN Meds:.acetaminophen **OR** acetaminophen, hydrALAZINE, meclizine, ondansetron **OR** ondansetron (ZOFRAN) IV  Micro Results No results  found for this or any previous visit (from the past 240 hour(s)).  Radiology Reports Mr Brain Wo Contrast  Result Date: 08/07/2015 CLINICAL DATA:  Progressive gait and balance abnormalities over the last 3-6 months. Episode of slurred speech lasting for 3 weeks 3 months ago. EXAM: MRI HEAD WITHOUT CONTRAST TECHNIQUE: Multiplanar, multiecho pulse sequences of the brain and surrounding structures were obtained without intravenous contrast. COMPARISON:  MRI brain 02/04/2014. FINDINGS: Diffusion-weighted images demonstrate no evidence for acute or subacute infarction. There is subtle increased diffusion trace signal within the planum sphenoidale meningioma. The plan is sphenoid ally meningioma is stable measuring 2.8 cm in transverse diameter. The lesion is anterior to the optic chiasm. Midline structures are otherwise within normal limits. Mild periventricular T2 changes are stable bilaterally. A remote lacunar infarct of the left thalamus is again noted. Remote ischemic changes are present in the right thalamus. Brainstem and cerebellum are within normal limits. The internal auditory canals are within normal limits bilaterally. Flow is present in the major intracranial arteries. The globes and orbits are intact. Mild mucosal thickening scattered throughout  the paranasal sinuses is similar to the prior study. The skullbase is within normal limits. IMPRESSION: 1. Stable planum sphenoidale meningioma. 2. No acute or focal abnormality to explain the patient's symptoms. 3. Remote lacunar infarct of the left thalamus. 4. Mild atrophy and white matter changes otherwise within normal limits for age. Electronically Signed   By: San Morelle M.D.   On: 08/07/2015 18:28   Time Spent in minutes  30   Jani Gravel M.D on 08/08/2015 at 11:48 AM  Between 7am to 7pm - Pager - [phone]  After 7pm go to [email or web address] - password [email or web address]  [facility] -  Office  [phone]

## 2015-08-08 NOTE — Progress Notes (Signed)
NURSING PROGRESS NOTE  KHI COSTILLA [id number] Admission Data: 08/07/2015 2200  Attending Provider: Ivor Costa, MD [id number] N, MD Code Status: FULL  Allergies:  Review of patient's allergies indicates no known allergies. Past Medical History:   has a past medical history of Arthritis; CKD (chronic kidney disease), stage III; Diabetes mellitus without complication ([place]); Hyperlipidemia; and Hypertension. Past Surgical History:   has a past surgical history that includes Inguinal hernia repair (Left, 06/09/2013) and Insertion of mesh (Left, 06/09/2013). Social History:   reports that he has quit smoking. His smoking use included Cigars. He does not have any smokeless tobacco history on file. He reports that he does not drink alcohol or use drugs.  Justin Holloway is a 82 y.o. male patient admitted from ED:   Last Documented Vital Signs: Blood pressure (!) 176/86, pulse 95, temperature 98.3 F (36.8 C), temperature source Oral, resp. rate 16, height 5\' 5"  (1.651 m), weight 96.4 kg (212 lb 9.6 oz), SpO2 96 %.  Cardiac Monitoring: Box # 7 in place. Cardiac monitor yields:normal sinus rhythm.  IV Fluids:  IV in place, occlusive dsg intact without redness, IV cath forearm left, condition patent and no redness normal saline.   Skin: Intact and appropriate for ethnicity  Patient/Family orientated to room. Information packet given to patient/family. Admission inpatient armband information verified with patient/family to include name and date of birth and placed on patient arm. Side rails up x 2, fall assessment and education completed with patient/family. Patient/family able to verbalize understanding of risk associated with falls and verbalized understanding to call for assistance before getting out of bed. Call light within reach. Patient/family able to voice and demonstrate understanding of unit orientation instructions.    Will continue to evaluate and treat per MD orders.  Clydell Hakim RN, BSN

## 2015-08-09 DIAGNOSIS — N390 Urinary tract infection, site not specified: Secondary | ICD-10-CM

## 2015-08-09 DIAGNOSIS — R42 Dizziness and giddiness: Secondary | ICD-10-CM | POA: Diagnosis not present

## 2015-08-09 DIAGNOSIS — N179 Acute kidney failure, unspecified: Secondary | ICD-10-CM | POA: Diagnosis not present

## 2015-08-09 DIAGNOSIS — E875 Hyperkalemia: Secondary | ICD-10-CM | POA: Diagnosis not present

## 2015-08-09 LAB — GLUCOSE, CAPILLARY
GLUCOSE-CAPILLARY: 126 mg/dL — AB (ref 65–99)
Glucose-Capillary: 358 mg/dL — ABNORMAL HIGH (ref 65–99)

## 2015-08-09 LAB — COMPREHENSIVE METABOLIC PANEL
ALBUMIN: 3.1 g/dL — AB (ref 3.5–5.0)
ALK PHOS: 49 U/L (ref 38–126)
ALT: 8 U/L — ABNORMAL LOW (ref 17–63)
AST: 12 U/L — AB (ref 15–41)
Anion gap: 5 (ref 5–15)
BILIRUBIN TOTAL: 0.4 mg/dL (ref 0.3–1.2)
BUN: 33 mg/dL — AB (ref 6–20)
CALCIUM: 8.7 mg/dL — AB (ref 8.9–10.3)
CO2: 21 mmol/L — ABNORMAL LOW (ref 22–32)
CREATININE: 2.07 mg/dL — AB (ref 0.61–1.24)
Chloride: 112 mmol/L — ABNORMAL HIGH (ref 101–111)
GFR calc Af Amer: 32 mL/min — ABNORMAL LOW (ref >60)
GFR, EST NON AFRICAN AMERICAN: 28 mL/min — AB (ref >60)
GLUCOSE: 115 mg/dL — AB (ref 65–99)
Potassium: 4.1 mmol/L (ref 3.5–5.1)
Sodium: 138 mmol/L (ref 135–145)
TOTAL PROTEIN: 6 g/dL — AB (ref 6.5–8.1)

## 2015-08-09 LAB — CBC
HEMATOCRIT: 30.2 % — AB (ref 39.0–52.0)
HEMOGLOBIN: 9.6 g/dL — AB (ref 13.0–17.0)
MCH: 28.5 pg (ref 26.0–34.0)
MCHC: 31.8 g/dL (ref 30.0–36.0)
MCV: 89.6 fL (ref 78.0–100.0)
Platelets: 185 10*3/uL (ref 150–400)
RBC: 3.37 MIL/uL — ABNORMAL LOW (ref 4.22–5.81)
RDW: 13.7 % (ref 11.5–15.5)
WBC: 6.6 10*3/uL (ref 4.0–10.5)

## 2015-08-09 LAB — UREA NITROGEN, URINE: Urea Nitrogen, Ur: 473 mg/dL

## 2015-08-09 LAB — HEMOGLOBIN A1C
HEMOGLOBIN A1C: 8.9 % — AB (ref 4.8–5.6)
MEAN PLASMA GLUCOSE: 209 mg/dL

## 2015-08-09 MED ORDER — CEPHALEXIN 500 MG PO CAPS: 500.0000 mg | ORAL_CAPSULE | Freq: Two times a day (BID) | ORAL | 0 refills | Status: DC

## 2015-08-09 MED ORDER — AMLODIPINE BESYLATE 5 MG PO TABS: 5.0000 mg | ORAL_TABLET | Freq: Every day | ORAL | 0 refills | Status: DC

## 2015-08-09 MED ORDER — CEPHALEXIN 500 MG PO CAPS
500.0000 mg | ORAL_CAPSULE | Freq: Two times a day (BID) | ORAL | Status: DC
  Administered 2015-08-09: 500 mg via ORAL
  Filled 2015-08-09: qty 1

## 2015-08-09 MED ORDER — PIOGLITAZONE HCL 15 MG PO TABS
15.0000 mg | ORAL_TABLET | Freq: Every day | ORAL | Status: DC
  Administered 2015-08-09: 15 mg via ORAL
  Filled 2015-08-09: qty 1

## 2015-08-09 MED ORDER — PIOGLITAZONE HCL 15 MG PO TABS: 15.0000 mg | ORAL_TABLET | Freq: Every day | ORAL | 0 refills | Status: DC

## 2015-08-09 MED ORDER — SODIUM CHLORIDE 0.9 % IV SOLN
INTRAVENOUS | Status: AC
  Administered 2015-08-09: 09:00:00 via INTRAVENOUS

## 2015-08-09 NOTE — Progress Notes (Signed)
Patient seen and examined at bedside. Please see admission note by Dr. Megan Salon on 08/08/2015. Patient advised to follow-up with his primary care doctor within 1-2 weeks.  Faye Ramsay, MD  [facility] Pager [phone]  If 7PM-7AM, please contact night-coverage [email or web address] Password [email or web address]

## 2015-08-09 NOTE — Care Management Note (Signed)
Case Management Note  Patient Details  Name: DERIAN BOZELL MRN: [id number] Date of Birth: Feb 14, 1929  Subjective/Objective:      Admitted with UTI.             Action/Plan: Plan is to d/c to home today assuming care for self with help from family if needed.  Expected Discharge Date:   08/09/2015           Expected Discharge Plan:  Home/Self Care  In-House Referral:     Discharge planning Services  CM Consult   Status of Service:  Completed, signed off  If discussed at [place] of Stay Meetings, dates discussed:    Additional Comments:  Sharin Mons, RN 08/09/2015, 11:17 AM

## 2015-08-09 NOTE — Progress Notes (Signed)
Physical Therapy Treatment Patient Details Name: Justin Holloway MRN: [id number] DOB: May 23, 1929 Today's Date: 08/09/2015    History of Present Illness 82 y.o. male with medical history significant of hypertension, hyperlipidemia, diabetes mellitus, lacunar stroke, arthritis, CKD-III, who presents with dizziness and a burning on urination. Pt admitted for UTI.    PT Comments    Patient is progressing toward mobility goals. Currently mod I/min guard for mobility. Current plan remains appropriate.   Follow Up Recommendations  No PT follow up;Supervision - Intermittent     Equipment Recommendations  None recommended by PT    Recommendations for Other Services       Precautions / Restrictions Precautions Precautions: Fall Restrictions Weight Bearing Restrictions: No    Mobility  Bed Mobility Overal bed mobility: Modified Independent Bed Mobility: Supine to Sit     Supine to sit: Modified independent (Device/Increase time)     General bed mobility comments: HOB elevated and increased time  Transfers Overall transfer level: Needs assistance Equipment used: None Transfers: Sit to/from Stand Sit to Stand: Min guard         General transfer comment: cues for safety; pt used momentum to power up into standing; min guard for safety  Ambulation/Gait Ambulation/Gait assistance: Min guard Ambulation Distance (Feet): 200 Feet Assistive device: None Gait Pattern/deviations: Step-through pattern;Decreased stride length;Wide base of support Gait velocity: decreased   General Gait Details: slow, grossly steady gait; cues for cadence and bilat step length and safety   Stairs            Wheelchair Mobility    Modified Rankin (Stroke Patients Only)       Balance     Sitting balance-Leahy Scale: Good       Standing balance-Leahy Scale: Fair                      Cognition Arousal/Alertness: Awake/alert Behavior During Therapy: WFL for tasks  assessed/performed Overall Cognitive Status: Within Functional Limits for tasks assessed                      Exercises      General Comments        Pertinent Vitals/Pain Pain Assessment: No/denies pain    Home Living                      Prior Function            PT Goals (current goals can now be found in the care plan section) Acute Rehab PT Goals Patient Stated Goal: go home Progress towards PT goals: Progressing toward goals    Frequency  Min 3X/week    PT Plan Current plan remains appropriate    Co-evaluation             End of Session Equipment Utilized During Treatment: Gait belt Activity Tolerance: Patient tolerated treatment well Patient left: in bed;with call bell/phone within reach;with family/visitor present     Time: [id number] PT Time Calculation (min) (ACUTE ONLY): 16 min  Charges:  $Gait Training: 8-22 mins                    G Codes:      Salina April, PTA Pager: [phone]   08/09/2015, 1:40 PM

## 2015-08-09 NOTE — Discharge Summary (Signed)
Justin Holloway, is a 82 y.o. male  DOB 1929-08-21  MRN [id number].  Admission date:  08/07/2015  Admitting Physician  Ivor Costa, MD  Discharge Date:  08/09/2015   Primary MD  [name], MD  Recommendations for primary care physician for things to follow:   Hyperkalemia Stopped Edarbi Check cmp in 1 week  ARF on CRF (note patient states was not taking lasix?) Stopped Edarbi Stopped Metformin Check cmp in 1 week  Dm2 Started on actos 15mg  po qday to replace metformin which was discontinued due to renal failure Cont Toujeo Cont amaryl Please follow up on sugars  Hypertension Off Edarbi, replaced with amlodipine 5mg  po qday Please check bp at visit.   Uti Keflex 500mg  po bid x 6 days  Anemia Please repeat cbc  In  1 week Consider further w/up   Admission Diagnosis  Hyperkalemia [E87.5] UTI (lower urinary tract infection) [N39.0] Acute kidney injury ([place]) [N17.9]   Discharge Diagnosis  Hyperkalemia [E87.5] UTI (lower urinary tract infection) [N39.0] Acute kidney injury ([place]) [N17.9]    Principal Problem:   UTI (lower urinary tract infection) Active Problems:   Diabetes mellitus without complication (HCC)   HLD (hyperlipidemia)   HYPERTENSION, BENIGN SYSTEMIC   Dizziness   Acute renal failure superimposed on stage 3 chronic kidney disease (HCC)   Hyperkalemia      Past Medical History:  Diagnosis Date  . Arthritis   . CKD (chronic kidney disease), stage III   . Diabetes mellitus without complication ([place])   . Hyperlipidemia   . Hypertension     Past Surgical History:  Procedure Laterality Date  . INGUINAL HERNIA REPAIR Left 06/09/2013   Procedure: OPEN REPAIR LEFT INGUINAL HERNIA;  Surgeon: [name], MD;  Location: [facility];  Service: General;  Laterality: Left;  . INSERTION OF MESH Left 06/09/2013   Procedure: INSERTION OF MESH;  Surgeon: [name],  MD;  Location: [facility];  Service: General;  Laterality: Left;       HPI  from the history and physical done on the day of admission:    82 y.o. male with medical history significant of hypertension, hyperlipidemia, diabetes mellitus, lacunar stroke, arthritis, CKD-III, who presents with dizziness and a burning on urination.  Pt state that he had intermittently dizziness over the last few years. He had an MRI in January 2016 showing a left thalamic small infarct. He states his doctor told him that his dizziness was from "mini strokes". Pt states that his dizziness has been worsening in the past 2 weeks. He also has mild blurry vision, no ear ringing. No unilateral weakness, numbness or tingling in extremities. He does not feel like room spinning around him. He also reports intermittent burning on urination and increased urinary frequency (patient is on Lasix), denies dysuria. Patient does not have chest pain, shortness of breath, palpitation, nausea, vomiting, diarrhea, abdominal pain, fever, chills, rashes.  ED Course: pt was found to have positive urinalysis with large amount of leukocytes  and positive nitrates, WBC 7.2, temperature normal, transient tachycardia, potassium 5.8 without T-wave peaking, worsening renal function. Pt is placed on tele bed for obs.  # MRI-brain showed: 1. stable planum sphenoidale meningioma; 2. No acute or focal abnormality to explain the patient's symptoms.  Remote lacunar infarct of the left thalamus. 4. Mild atrophy and white matter changes otherwise within normal limits for age.     Hospital Course:       Pt was noted to be in acute on chronic renal failure.  Bu/creatinine 46/2.35  .  Pt hydrate with normal saline. Edarbi stopped.  Pt denies being on lasix?   Pt was started on amlodipine 5mg  po qday for bp control.   Dizziness thought to be multfactorial, uti, and renal failure w/ metformin on board has resolved.  Metformin stopped on admission due to renal  failure and has been replaced with Actos.  uti tx with rocephin and converted to keflex.  Pt appears to be doing well.    Follow UP  Follow-up Information    SANDERS,ROBYN N, MD Follow up in 1 week(s).   Specialty:  Internal Medicine Contact information: [address] [place] [address] [phone]            Consults obtained - none Discharge Condition: stable   Diet and Activity recommendation: See Discharge Instructions below  Discharge Instructions         Discharge Medications       Medication List    STOP taking these medications   EDARBI 80 MG Tabs Generic drug:  Azilsartan Medoxomil   furosemide 20 MG tablet Commonly known as:  LASIX   metFORMIN 1000 MG tablet Commonly known as:  GLUCOPHAGE     TAKE these medications   amLODipine 5 MG tablet Commonly known as:  NORVASC Take 1 tablet (5 mg total) by mouth daily. Replaces Edarbi   aspirin EC 81 MG tablet Take 81 mg by mouth every evening.   cephALEXin 500 MG capsule Commonly known as:  KEFLEX Take 1 capsule (500 mg total) by mouth every 12 (twelve) hours.   cholecalciferol 1000 units tablet Commonly known as:  VITAMIN D Take 1,000 Units by mouth 2 (two) times daily.   glimepiride 4 MG tablet Commonly known as:  AMARYL Take 4 mg by mouth 2 (two) times daily.   multivitamin with minerals Tabs tablet Take 1 tablet by mouth daily.   pioglitazone 15 MG tablet Commonly known as:  ACTOS Take 1 tablet (15 mg total) by mouth daily.   pravastatin 40 MG tablet Commonly known as:  PRAVACHOL Take 40 mg by mouth every evening.   TOUJEO SOLOSTAR 300 UNIT/ML Sopn Generic drug:  Insulin Glargine Inject 10 Units into the skin at bedtime.       Major procedures and Radiology Reports - PLEASE review detailed and final reports for all details, in brief -      Mr Brain Wo Contrast  Result Date: 08/07/2015 CLINICAL DATA:  Progressive gait and balance abnormalities over the  last 3-6 months. Episode of slurred speech lasting for 3 weeks 3 months ago. EXAM: MRI HEAD WITHOUT CONTRAST TECHNIQUE: Multiplanar, multiecho pulse sequences of the brain and surrounding structures were obtained without intravenous contrast. COMPARISON:  MRI brain 02/04/2014. FINDINGS: Diffusion-weighted images demonstrate no evidence for acute or subacute infarction. There is subtle increased diffusion trace signal within the planum sphenoidale meningioma. The plan is sphenoid ally meningioma is stable measuring 2.8 cm in transverse diameter. The lesion  is anterior to the optic chiasm. Midline structures are otherwise within normal limits. Mild periventricular T2 changes are stable bilaterally. A remote lacunar infarct of the left thalamus is again noted. Remote ischemic changes are present in the right thalamus. Brainstem and cerebellum are within normal limits. The internal auditory canals are within normal limits bilaterally. Flow is present in the major intracranial arteries. The globes and orbits are intact. Mild mucosal thickening scattered throughout the paranasal sinuses is similar to the prior study. The skullbase is within normal limits. IMPRESSION: 1. Stable planum sphenoidale meningioma. 2. No acute or focal abnormality to explain the patient's symptoms. 3. Remote lacunar infarct of the left thalamus. 4. Mild atrophy and white matter changes otherwise within normal limits for age. Electronically Signed   By: San Morelle M.D.   On: 08/07/2015 18:28   Micro Results     No results found for this or any previous visit (from the past 240 hour(s)).     Today   Subjective    Justin Holloway today has no headache,no chest abdominal pain,no new weakness tingling or numbness, feels much better wants to go home today.   Objective   Blood pressure 119/60, pulse 88, temperature 99.3 F (37.4 C), temperature source Oral, resp. rate 16, height 5\' 5"  (1.651 m), weight 96.4 kg (212 lb 9.6  oz), SpO2 99 %.   Intake/Output Summary (Last 24 hours) at 08/09/15 0726 Last data filed at 08/09/15 [id number]  Gross per 24 hour  Intake          1622.25 ml  Output             1600 ml  Net            22.25 ml    Exam Awake Alert, Oriented x 3, No new F.N deficits, Normal affect [place].AT,PERRAL Supple Neck,No JVD, No cervical lymphadenopathy appriciated.  Symmetrical Chest wall movement, Good air movement bilaterally, CTAB RRR,No Gallops,Rubs or new Murmurs, No Parasternal Heave +ve B.Sounds, Abd Soft, Non tender, No organomegaly appriciated, No rebound -guarding or rigidity. No Cyanosis, Clubbing or edema, No new Rash or bruise   Data Review   CBC w Diff:  Lab Results  Component Value Date   WBC 6.2 08/08/2015   HGB 10.0 (L) 08/08/2015   HCT 31.4 (L) 08/08/2015   PLT 201 08/08/2015   LYMPHOPCT 34 06/04/2013   MONOPCT 8 06/04/2013   EOSPCT 3 06/04/2013   BASOPCT 0 06/04/2013    CMP:  Lab Results  Component Value Date   NA 139 08/08/2015   K 5.1 08/08/2015   CL 115 (H) 08/08/2015   CO2 21 (L) 08/08/2015   BUN 37 (H) 08/08/2015   CREATININE 2.08 (H) 08/08/2015   PROT 7.5 06/04/2013   ALBUMIN 3.8 06/04/2013   BILITOT 0.3 06/04/2013   ALKPHOS 81 06/04/2013   AST 16 06/04/2013   ALT 10 06/04/2013  .   Total Time in preparing paper work, data evaluation and todays exam - 56 minutes  Jani Gravel M.D on 08/09/2015 at 7:26 AM  [facility]   Office  [phone]

## 2015-08-09 NOTE — Care Management Obs Status (Signed)
MEDICARE OBSERVATION STATUS NOTIFICATION   Patient Details  Name: KIAM TALFORD MRN: [id number] Date of Birth: Jun 23, 1929   Medicare Observation Status Notification Given:  Yes    Sharin Mons, RN 08/09/2015, 10:26 AM

## 2015-08-11 LAB — URINE CULTURE

## 2016-02-07 ENCOUNTER — Emergency Department (HOSPITAL_COMMUNITY): Payer: Medicare Other

## 2016-02-07 ENCOUNTER — Encounter (HOSPITAL_COMMUNITY): Payer: Self-pay | Admitting: *Deleted

## 2016-02-07 ENCOUNTER — Emergency Department (HOSPITAL_COMMUNITY)
Admission: EM | Admit: 2016-02-07 | Discharge: 2016-02-07 | Disposition: A | Discharge status: Home / Self Care | Payer: Medicare Other | Attending: Emergency Medicine | Admitting: Emergency Medicine

## 2016-02-07 DIAGNOSIS — I129 Hypertensive chronic kidney disease with stage 1 through stage 4 chronic kidney disease, or unspecified chronic kidney disease: Secondary | ICD-10-CM | POA: Insufficient documentation

## 2016-02-07 DIAGNOSIS — R600 Localized edema: Secondary | ICD-10-CM | POA: Insufficient documentation

## 2016-02-07 DIAGNOSIS — Z794 Long term (current) use of insulin: Secondary | ICD-10-CM | POA: Insufficient documentation

## 2016-02-07 DIAGNOSIS — E1122 Type 2 diabetes mellitus with diabetic chronic kidney disease: Secondary | ICD-10-CM | POA: Diagnosis not present

## 2016-02-07 DIAGNOSIS — Z87891 Personal history of nicotine dependence: Secondary | ICD-10-CM | POA: Insufficient documentation

## 2016-02-07 DIAGNOSIS — Z79899 Other long term (current) drug therapy: Secondary | ICD-10-CM | POA: Insufficient documentation

## 2016-02-07 DIAGNOSIS — R42 Dizziness and giddiness: Secondary | ICD-10-CM | POA: Insufficient documentation

## 2016-02-07 DIAGNOSIS — Z7982 Long term (current) use of aspirin: Secondary | ICD-10-CM | POA: Diagnosis not present

## 2016-02-07 DIAGNOSIS — R0602 Shortness of breath: Secondary | ICD-10-CM | POA: Diagnosis present

## 2016-02-07 DIAGNOSIS — N183 Chronic kidney disease, stage 3 (moderate): Secondary | ICD-10-CM | POA: Diagnosis not present

## 2016-02-07 LAB — CBC
HCT: 38.5 % — ABNORMAL LOW (ref 39.0–52.0)
HEMOGLOBIN: 12.2 g/dL — AB (ref 13.0–17.0)
MCH: 28.4 pg (ref 26.0–34.0)
MCHC: 31.7 g/dL (ref 30.0–36.0)
MCV: 89.7 fL (ref 78.0–100.0)
Platelets: 204 10*3/uL (ref 150–400)
RBC: 4.29 MIL/uL (ref 4.22–5.81)
RDW: 13.4 % (ref 11.5–15.5)
WBC: 6.4 10*3/uL (ref 4.0–10.5)

## 2016-02-07 LAB — URINALYSIS, ROUTINE W REFLEX MICROSCOPIC
BILIRUBIN URINE: NEGATIVE
GLUCOSE, UA: NEGATIVE mg/dL
KETONES UR: NEGATIVE mg/dL
Nitrite: NEGATIVE
PH: 5 (ref 5.0–8.0)
PROTEIN: NEGATIVE mg/dL
Specific Gravity, Urine: 1.01 (ref 1.005–1.030)

## 2016-02-07 LAB — I-STAT TROPONIN, ED: TROPONIN I, POC: 0 ng/mL (ref 0.00–0.08)

## 2016-02-07 LAB — CBG MONITORING, ED: Glucose-Capillary: 111 mg/dL — ABNORMAL HIGH (ref 65–99)

## 2016-02-07 LAB — BASIC METABOLIC PANEL
ANION GAP: 9 (ref 5–15)
BUN: 43 mg/dL — ABNORMAL HIGH (ref 6–20)
CHLORIDE: 107 mmol/L (ref 101–111)
CO2: 24 mmol/L (ref 22–32)
Calcium: 9.6 mg/dL (ref 8.9–10.3)
Creatinine, Ser: 2.48 mg/dL — ABNORMAL HIGH (ref 0.61–1.24)
GFR calc Af Amer: 26 mL/min — ABNORMAL LOW (ref >60)
GFR calc non Af Amer: 22 mL/min — ABNORMAL LOW (ref >60)
Glucose, Bld: 128 mg/dL — ABNORMAL HIGH (ref 65–99)
Potassium: 4.5 mmol/L (ref 3.5–5.1)
SODIUM: 140 mmol/L (ref 135–145)

## 2016-02-07 LAB — BRAIN NATRIURETIC PEPTIDE: B Natriuretic Peptide: 43.6 pg/mL (ref 0.0–100.0)

## 2016-02-07 NOTE — ED Notes (Signed)
Patient transported to MRI 

## 2016-02-07 NOTE — ED Triage Notes (Signed)
Pt has been "stagering" for several months and it is getting worse, despite using meclizine.  Was scanned here several months ago when he began experiencing the symptoms. Also c/o increasing dyspnea with exertion.  Pt thinks he's gained 20 lbs in the last "bit".

## 2016-02-07 NOTE — ED Provider Notes (Signed)
[facility] DEPT Provider Note   CSN: [id number] Arrival date & time: 02/07/16  1119     History   Chief Complaint Chief Complaint  Patient presents with  . Dizziness  . Shortness of Breath    HPI CARSIN RANDAZZO is a 83 y.o. male.  Patient is an 83 year old male with a history of 2 separate things going on. Patient states he's had dizziness for the last 3 months that's worsened more recently. He states that it is severe with walking and he feels like he stumbles and is off balance with walking. He has been taking meclizine without improvement. He has not had any imaging on his brain since the start and was initially told it was vertigo. He called his doctor today and states that it was not improving with medication and they recommended he come here. Secondly patient complains of 2 weeks of worsening exertional dyspnea, leg swelling and weight gain. He is unclear how much weight he has gained but is closer tight. He denies any chest pain or orthopnea. He denies any history of CHF and does not take diuretics.   The history is provided by the patient.  Dizziness  Quality:  Imbalance Severity:  Moderate Onset quality:  Gradual Duration:  3 months Timing:  Constant Progression:  Worsening Chronicity:  Recurrent Context comment:  Walking feels like he staggers and not improved by meclizine Relieved by:  Lying down Associated symptoms: shortness of breath   Shortness of breath:    Severity:  Moderate   Onset quality:  Gradual   Duration:  2 weeks   Timing:  Constant   Progression:  Worsening (lower ext swelling and SOB on exertion) Risk factors comment:  Hx of HTN and DM, CKD Shortness of Breath     Past Medical History:  Diagnosis Date  . Arthritis   . CKD (chronic kidney disease), stage III   . Diabetes mellitus without complication ([place])   . Hyperlipidemia   . Hypertension     Patient Active Problem List   Diagnosis Date Noted  . UTI (lower urinary tract  infection) 08/07/2015  . Dizziness 08/07/2015  . Acute renal failure superimposed on stage 3 chronic kidney disease ([place]) 08/07/2015  . Hyperkalemia 08/07/2015  . Left inguinal hernia 05/15/2013  . Diabetes mellitus without complication ([place]) 32/35/5732  . HLD (hyperlipidemia) 03/08/2006  . HYPERTENSION, BENIGN SYSTEMIC 03/08/2006  . 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 PROTEINURIA 03/08/2006    Past Surgical History:  Procedure Laterality Date  . INGUINAL HERNIA REPAIR Left 06/09/2013   Procedure: OPEN REPAIR LEFT INGUINAL HERNIA;  Surgeon: [name], MD;  Location: [facility];  Service: General;  Laterality: Left;  . INSERTION OF MESH Left 06/09/2013   Procedure: INSERTION OF MESH;  Surgeon: [name], MD;  Location: [facility];  Service: General;  Laterality: Left;       Home Medications    Prior to Admission medications   Medication Sig Start Date End Date Taking? Authorizing Provider  amLODipine (NORVASC) 5 MG tablet Take 1 tablet (5 mg total) by mouth daily. Replaces Earnest Rosier 08/09/15   Jani Gravel, MD  aspirin EC 81 MG tablet Take 81 mg by mouth every evening.    Historical Provider, MD  cephALEXin (KEFLEX) 500 MG capsule Take 1 capsule (500 mg total) by mouth every 12 (twelve) hours. 08/09/15   Jani Gravel, MD  cholecalciferol (VITAMIN D) 1000 units tablet Take 1,000 Units by mouth 2 (two) times daily.    Historical  Provider, MD  glimepiride (AMARYL) 4 MG tablet Take 4 mg by mouth 2 (two) times daily.     Historical Provider, MD  Insulin Glargine (TOUJEO SOLOSTAR) 300 UNIT/ML SOPN Inject 10 Units into the skin at bedtime.    Historical Provider, MD  Multiple Vitamin (MULTIVITAMIN WITH MINERALS) TABS tablet Take 1 tablet by mouth daily.    Historical Provider, MD  pioglitazone (ACTOS) 15 MG tablet Take 1 tablet (15 mg total) by mouth daily. 08/09/15   Jani Gravel, MD  pravastatin (PRAVACHOL) 40 MG tablet Take 40 mg by mouth every evening.     Historical Provider, MD    Family  History Family History  Problem Relation Age of Onset  . Cancer Mother 51    breast  . COPD Father 65    Congestive heart failure    Social History Social History  Substance Use Topics  . Smoking status: Former Smoker    Types: Cigars  . Smokeless tobacco: Never Used  . Alcohol use No     Allergies   Patient has no known allergies.   Review of Systems Review of Systems  Respiratory: Positive for shortness of breath.   Neurological: Positive for dizziness.  All other systems reviewed and are negative.    Physical Exam Updated Vital Signs BP 139/69 (BP Location: Right Arm)   Pulse 78   Temp 98.1 F (36.7 C) (Oral)   Resp 16   Ht 5\' 5"  (1.651 m)   Wt 236 lb (107 kg)   SpO2 100%   BMI 39.27 kg/m   Physical Exam  Constitutional: He is oriented to person, place, and time. He appears well-developed and well-nourished. No distress.  HENT:  Head: Normocephalic and atraumatic.  Mouth/Throat: Oropharynx is clear and moist.  Eyes: Conjunctivae and EOM are normal. Pupils are equal, round, and reactive to light.  Neck: Normal range of motion. Neck supple.  Cardiovascular: Normal rate, regular rhythm and intact distal pulses.   No murmur heard. Pulmonary/Chest: Effort normal and breath sounds normal. No respiratory distress. He has no wheezes. He has no rales.  Abdominal: Soft. He exhibits distension. There is no tenderness. There is no rebound and no guarding.  Musculoskeletal: Normal range of motion. He exhibits edema. He exhibits no tenderness.  Bilateral lower extremity edema  Neurological: He is alert and oriented to person, place, and time. He has normal strength. No cranial nerve deficit or sensory deficit. Coordination normal.  Normal heel to shin bilaterally.  With walking no ataxia but gait is wide based but no shuffling.  Pt c/o of walking funny and staggering while we are walking but this was not noted.  Skin: Skin is warm and dry. No rash noted. No erythema.   Psychiatric: He has a normal mood and affect. His behavior is normal.  Nursing note and vitals reviewed.    ED Treatments / Results  Labs (all labs ordered are listed, but only abnormal results are displayed) Labs Reviewed  BASIC METABOLIC PANEL - Abnormal; Notable for the following:       Result Value   Glucose, Bld 128 (*)    BUN 43 (*)    Creatinine, Ser 2.48 (*)    GFR calc non Af Amer 22 (*)    GFR calc Af Amer 26 (*)    All other components within normal limits  CBC - Abnormal; Notable for the following:    Hemoglobin 12.2 (*)    HCT 38.5 (*)    All other components  within normal limits  URINALYSIS, ROUTINE W REFLEX MICROSCOPIC - Abnormal; Notable for the following:    Hgb urine dipstick SMALL (*)    Leukocytes, UA MODERATE (*)    Bacteria, UA RARE (*)    Squamous Epithelial / LPF 0-5 (*)    All other components within normal limits  CBG MONITORING, ED - Abnormal; Notable for the following:    Glucose-Capillary 111 (*)    All other components within normal limits  BRAIN NATRIURETIC PEPTIDE  I-STAT TROPOININ, ED    EKG  EKG Interpretation  Date/Time:  Monday February 07 2016 11:30:39 EST Ventricular Rate:  88 PR Interval:  198 QRS Duration: 78 QT Interval:  374 QTC Calculation: 452 R Axis:   22 Text Interpretation:  Normal sinus rhythm Nonspecific T wave abnormality No significant change since last tracing Confirmed by Maryan Rued  MD, Loree Fee ([id number]) on 02/07/2016 2:07:51 PM       Radiology Dg Chest 2 View  Result Date: 02/07/2016 CLINICAL DATA:  Dizziness.  Pedal edema. EXAM: CHEST  2 VIEW COMPARISON:  06/04/2013 FINDINGS: Heart size and pulmonary vascularity are normal and the lungs are clear. There is chronic elevation of the left hemidiaphragm. No acute bone abnormality. Arthritic changes of both shoulders, left greater than right. IMPRESSION: No active cardiopulmonary disease. Electronically Signed   By: Lorriane Shire M.D.   On: 02/07/2016 15:23     Procedures Procedures (including critical care time)  Medications Ordered in ED Medications - No data to display   Initial Impression / Assessment and Plan / ED Course  I have reviewed the triage vital signs and the nursing notes.  Pertinent labs & imaging results that were available during my care of the patient were reviewed by me and considered in my medical decision making (see chart for details).    Patient is an 83 year old male presenting today with 2 separate issues. Initial symptoms concerning for CHF with increased leg swelling, exertional dyspnea and weight gain. He currently denies any chest pain and EKG without acute changes. Troponin is negative and BNP normal, chest x-ray pending.  UA shows moderate leukocytes and 6-30 white blood cells with rare bacteria however this could be chronic colonization. Patient does have mild increase in creatinine now with a creatinine of 2.48 and a BUN of 43.  CBC without acute findings. Concern for potential stroke as the cause for patient's dizziness and MRI ordered. Chest x-ray still pending but potential for CHF. Given the diffuse extremity edema on top of renal failure.  Patient currently in no acute distress and vital signs within normal limits. We'll check patient out to Dr. Billy Fischer. Final Clinical Impressions(s) / ED Diagnoses   Final diagnoses:  None    New Prescriptions New Prescriptions   No medications on file     Blanchie Dessert, MD 02/07/16 2156

## 2016-02-07 NOTE — ED Notes (Signed)
Dr. Billy Fischer at bedside discussing results with pt

## 2016-02-07 NOTE — ED Notes (Signed)
Patient presents to ED c/o unsteady x 3 months , states he was seen 5 months ago for same and had CT Scan  And states they weren't able  Find any thing wrong. C/o pedal edema states they go down at night after laying down however after getting up they start to swell. States he has been taking meclizine without relief

## 2016-02-08 NOTE — ED Provider Notes (Signed)
Received care of patient from Dr. Maryan Rued. Please see her note for prior history and physical. Briefly this is an 83 year old male who presents with concern for dizziness for 3 months, as well as shortness of breath and leg swelling. His chest x-ray showed no acute findings, BNP was within normal limits, and given patient's chronic kidney disease, recommend PCP follow-up regarding his concern of peripheral edema.  MRI is ordered and pending.   MRI shows no acute intracranial abnormality, stable microvascular changes, stable meningioma, and presence of sinus disease. Patient's difficulty and inability may be secondary to these swelling in his legs. Discussed results with patient and family. Family does report that deconditioning may also contribute to his balance problems, and encouraged increased activity in the home.  Patient discharged in stable condition with understanding of reasons to return.    Justin Morgan, MD 02/08/16 [phone]

## 2016-03-22 ENCOUNTER — Ambulatory Visit: Payer: Medicare Other | Admitting: *Deleted

## 2016-03-29 ENCOUNTER — Encounter: Payer: Self-pay | Admitting: Dietician

## 2016-03-29 ENCOUNTER — Encounter: Payer: Medicare Other | Attending: Internal Medicine | Admitting: Dietician

## 2016-03-29 VITALS — Ht 65.0 in | Wt 229.7 lb

## 2016-03-29 DIAGNOSIS — E119 Type 2 diabetes mellitus without complications: Secondary | ICD-10-CM | POA: Diagnosis not present

## 2016-03-29 DIAGNOSIS — Z713 Dietary counseling and surveillance: Secondary | ICD-10-CM | POA: Insufficient documentation

## 2016-03-29 DIAGNOSIS — Z794 Long term (current) use of insulin: Secondary | ICD-10-CM

## 2016-03-29 NOTE — Patient Instructions (Addendum)
-   Practice nutrition label reading and look at/ focus on portion sizes and carbohydrate servings  - Practice eyeballing or measuring out portion sizes for all food groups (example: 1/3 cup of beans or rice is a carbohydrate serving) to get an idea for what portions look like on your plate  - Continue to use cooking methods such as baking instead of frying, great job!  - Continue to incorporate evening snacks (1-2 servings of carbohydrate)  - Overall, keep carbohydrate servings consistent throughout the day, and eat at regular time intervals throughout the day to keep your blood sugars steady

## 2016-03-29 NOTE — Progress Notes (Signed)
Diabetes Self-Management Education  Visit Type:  Initial  Appt. Start Time: 10:00 Appt. End Time: 11:00  03/29/2016  [name], identified by name and date of birth, is a 83 y.o. male with a diagnosis of Diabetes:  .   ASSESSMENT  Height 5\' 5"  (1.651 m), weight 229 lb 11.2 oz (104.2 kg). Body mass index is 38.22 kg/m.   Individualized Plan for Diabetes Self-Management Training:   Learning Objective:  Patient will have a greater understanding of diabetes self-management. Patient education plan is to attend individual and/or group sessions per assessed needs and concerns.   Plan:   Patient states desire to improve knowledge on how to eat to better manage diabetes. Dietary changes made x3 months. "Determined" to do what needs to be done to manage DM. Eats a diet high in vegetables, no fried foods, no soda/juice. Overall limits CHO in the diet. Has never been on a weight loss program. Walks with silver sneakers 2x/wk at the [facility] for exercise. Portion sizes, label reading, CHO sources and CHO servings were reviewed. Individual CHO goals for meals / snacks provided. Explained the importance of eating at regular times throughout the day and keeping CHO intake consistent at meals and snacks. Encouraged him to continue incorporating HS snacks into meal plan. Encouraged him to continue/ increase weekly exercise to work towards weight loss along with continuing to make dietary changes. States his biggest struggle is portion sizes- goal was made to practice proper portions with knowledge provided. Emphasis was put on not necessarily limiting CHO, but choosing "better" (I.e. Whole grain) sources and monitoring amount eaten. Patient wishes to call back to the office for a future appointment.  Goals: practice nutrition label reading (CHO and serving size as the focus), eyeball/measure out proper portion sizes for all food groups, increase weekly physical activity to 3 days/wk, continue current  cooking methods such as baking, continue to incorporate evening snack, keep CHO servings consistent throughout the day & eat at regular intervals to keep BG levels stable  Expected Outcomes:   Improved control of daily blood sugar readings (<150), lower HgbA1c, weight reduction  Education material provided: Living Well with Diabetes, My Plate and Carbohydrate counting sheet  If problems or questions, patient to contact team via:  Phone and Email  Future DSME appointment:  PRN

## 2016-05-01 DIAGNOSIS — H401131 Primary open-angle glaucoma, bilateral, mild stage: Secondary | ICD-10-CM | POA: Diagnosis not present

## 2016-06-19 DIAGNOSIS — I129 Hypertensive chronic kidney disease with stage 1 through stage 4 chronic kidney disease, or unspecified chronic kidney disease: Secondary | ICD-10-CM | POA: Diagnosis not present

## 2016-06-19 DIAGNOSIS — N183 Chronic kidney disease, stage 3 (moderate): Secondary | ICD-10-CM | POA: Diagnosis not present

## 2016-06-19 DIAGNOSIS — E1122 Type 2 diabetes mellitus with diabetic chronic kidney disease: Secondary | ICD-10-CM | POA: Diagnosis not present

## 2016-06-19 DIAGNOSIS — N08 Glomerular disorders in diseases classified elsewhere: Secondary | ICD-10-CM | POA: Diagnosis not present

## 2016-06-20 DIAGNOSIS — E1122 Type 2 diabetes mellitus with diabetic chronic kidney disease: Secondary | ICD-10-CM | POA: Diagnosis not present

## 2016-09-18 DIAGNOSIS — E1122 Type 2 diabetes mellitus with diabetic chronic kidney disease: Secondary | ICD-10-CM | POA: Diagnosis not present

## 2016-09-18 DIAGNOSIS — N184 Chronic kidney disease, stage 4 (severe): Secondary | ICD-10-CM | POA: Diagnosis not present

## 2016-09-18 DIAGNOSIS — I129 Hypertensive chronic kidney disease with stage 1 through stage 4 chronic kidney disease, or unspecified chronic kidney disease: Secondary | ICD-10-CM | POA: Diagnosis not present

## 2016-09-18 DIAGNOSIS — Z23 Encounter for immunization: Secondary | ICD-10-CM | POA: Diagnosis not present

## 2016-09-18 DIAGNOSIS — N08 Glomerular disorders in diseases classified elsewhere: Secondary | ICD-10-CM | POA: Diagnosis not present

## 2016-10-10 DIAGNOSIS — R05 Cough: Secondary | ICD-10-CM | POA: Diagnosis not present

## 2016-10-16 DIAGNOSIS — H401132 Primary open-angle glaucoma, bilateral, moderate stage: Secondary | ICD-10-CM | POA: Diagnosis not present

## 2016-10-16 DIAGNOSIS — H401131 Primary open-angle glaucoma, bilateral, mild stage: Secondary | ICD-10-CM | POA: Diagnosis not present

## 2016-10-25 ENCOUNTER — Other Ambulatory Visit (HOSPITAL_COMMUNITY): Payer: Self-pay | Admitting: Internal Medicine

## 2016-10-25 DIAGNOSIS — T17308A Unspecified foreign body in larynx causing other injury, initial encounter: Secondary | ICD-10-CM

## 2016-11-10 DIAGNOSIS — N390 Urinary tract infection, site not specified: Secondary | ICD-10-CM | POA: Diagnosis not present

## 2016-11-10 DIAGNOSIS — N184 Chronic kidney disease, stage 4 (severe): Secondary | ICD-10-CM | POA: Diagnosis not present

## 2016-11-10 DIAGNOSIS — R609 Edema, unspecified: Secondary | ICD-10-CM | POA: Diagnosis not present

## 2016-11-13 DIAGNOSIS — N184 Chronic kidney disease, stage 4 (severe): Secondary | ICD-10-CM | POA: Diagnosis not present

## 2016-11-14 ENCOUNTER — Other Ambulatory Visit: Payer: Self-pay | Admitting: Nephrology

## 2016-11-14 DIAGNOSIS — N184 Chronic kidney disease, stage 4 (severe): Secondary | ICD-10-CM

## 2016-11-21 ENCOUNTER — Ambulatory Visit
Admission: RE | Admit: 2016-11-21 | Discharge: 2016-11-21 | Disposition: A | Discharge status: Home / Self Care | Payer: Medicare Other | Source: Ambulatory Visit | Attending: Nephrology | Admitting: Nephrology

## 2016-11-21 DIAGNOSIS — N184 Chronic kidney disease, stage 4 (severe): Secondary | ICD-10-CM

## 2016-11-21 DIAGNOSIS — N189 Chronic kidney disease, unspecified: Secondary | ICD-10-CM | POA: Diagnosis not present

## 2016-12-22 DIAGNOSIS — Z0000 Encounter for general adult medical examination without abnormal findings: Secondary | ICD-10-CM | POA: Diagnosis not present

## 2016-12-22 DIAGNOSIS — I129 Hypertensive chronic kidney disease with stage 1 through stage 4 chronic kidney disease, or unspecified chronic kidney disease: Secondary | ICD-10-CM | POA: Diagnosis not present

## 2016-12-22 DIAGNOSIS — N184 Chronic kidney disease, stage 4 (severe): Secondary | ICD-10-CM | POA: Diagnosis not present

## 2016-12-22 DIAGNOSIS — E1122 Type 2 diabetes mellitus with diabetic chronic kidney disease: Secondary | ICD-10-CM | POA: Diagnosis not present

## 2016-12-22 DIAGNOSIS — E559 Vitamin D deficiency, unspecified: Secondary | ICD-10-CM | POA: Diagnosis not present

## 2017-01-06 ENCOUNTER — Encounter (HOSPITAL_COMMUNITY): Payer: Self-pay

## 2017-01-06 ENCOUNTER — Emergency Department (HOSPITAL_COMMUNITY)
Admission: EM | Admit: 2017-01-06 | Discharge: 2017-01-06 | Disposition: A | Discharge status: Home / Self Care | Payer: Medicare Other | Attending: Physician Assistant | Admitting: Physician Assistant

## 2017-01-06 ENCOUNTER — Emergency Department (HOSPITAL_COMMUNITY): Payer: Medicare Other

## 2017-01-06 DIAGNOSIS — Z7982 Long term (current) use of aspirin: Secondary | ICD-10-CM | POA: Insufficient documentation

## 2017-01-06 DIAGNOSIS — N183 Chronic kidney disease, stage 3 (moderate): Secondary | ICD-10-CM | POA: Insufficient documentation

## 2017-01-06 DIAGNOSIS — Z79899 Other long term (current) drug therapy: Secondary | ICD-10-CM | POA: Insufficient documentation

## 2017-01-06 DIAGNOSIS — E1122 Type 2 diabetes mellitus with diabetic chronic kidney disease: Secondary | ICD-10-CM | POA: Diagnosis not present

## 2017-01-06 DIAGNOSIS — I129 Hypertensive chronic kidney disease with stage 1 through stage 4 chronic kidney disease, or unspecified chronic kidney disease: Secondary | ICD-10-CM | POA: Insufficient documentation

## 2017-01-06 DIAGNOSIS — Z87891 Personal history of nicotine dependence: Secondary | ICD-10-CM | POA: Diagnosis not present

## 2017-01-06 DIAGNOSIS — Z794 Long term (current) use of insulin: Secondary | ICD-10-CM | POA: Diagnosis not present

## 2017-01-06 DIAGNOSIS — R42 Dizziness and giddiness: Secondary | ICD-10-CM

## 2017-01-06 LAB — BASIC METABOLIC PANEL
ANION GAP: 9 (ref 5–15)
BUN: 57 mg/dL — ABNORMAL HIGH (ref 6–20)
CO2: 21 mmol/L — ABNORMAL LOW (ref 22–32)
Calcium: 9 mg/dL (ref 8.9–10.3)
Chloride: 105 mmol/L (ref 101–111)
Creatinine, Ser: 2.75 mg/dL — ABNORMAL HIGH (ref 0.61–1.24)
GFR, EST AFRICAN AMERICAN: 23 mL/min — AB (ref >60)
GFR, EST NON AFRICAN AMERICAN: 20 mL/min — AB (ref >60)
GLUCOSE: 191 mg/dL — AB (ref 65–99)
POTASSIUM: 4.5 mmol/L (ref 3.5–5.1)
SODIUM: 135 mmol/L (ref 135–145)

## 2017-01-06 LAB — CBC
HCT: 36.4 % — ABNORMAL LOW (ref 39.0–52.0)
HEMOGLOBIN: 11.6 g/dL — AB (ref 13.0–17.0)
MCH: 27.8 pg (ref 26.0–34.0)
MCHC: 31.9 g/dL (ref 30.0–36.0)
MCV: 87.1 fL (ref 78.0–100.0)
Platelets: 202 10*3/uL (ref 150–400)
RBC: 4.18 MIL/uL — AB (ref 4.22–5.81)
RDW: 12.6 % (ref 11.5–15.5)
WBC: 4.8 10*3/uL (ref 4.0–10.5)

## 2017-01-06 NOTE — ED Notes (Signed)
Patient transported to MRI 

## 2017-01-06 NOTE — Discharge Instructions (Signed)
You do not have a new stroke.  We do think you peripheral vertigo.  We have given you the Epley maneuver to do at home.  You can also follow-up with ENT who may prescribe vestibular therapy.

## 2017-01-06 NOTE — ED Notes (Addendum)
Pt returned from MRI, denies dizziness presently- sts he had one episode while adjusting in MRI scanner. Pt provided urinal

## 2017-01-06 NOTE — ED Provider Notes (Signed)
[facility] EMERGENCY DEPARTMENT Provider Note   CSN: [id number] Arrival date & time: 01/06/17  9485     History   Chief Complaint Chief Complaint  Patient presents with  . Dizziness    HPI Justin Holloway is a 83 y.o. male.  HPI   Patient is an 83 year old male with history of diabetes hypertension hyperlipidemia and history of "mini strokes".  He is presenting today with dizziness.  He reports dizziness going on for last 2 weeks.  He went to primary care physician and was diagnosed with vertigo.  Patient was started on meclizine.  Patient symptoms do sound vertiginous.  He reports they are only bad when he lies down and with his head looking to the right-hand side.  So very typical of benign positional vertigo.  However patient's wife reports that he is also unable to walk without assistance since this started.  He  reports unsteadiness on his feet.  Past Medical History:  Diagnosis Date  . Arthritis   . CKD (chronic kidney disease), stage III ([place])   . Diabetes mellitus without complication ([place])   . Hyperlipidemia   . Hypertension     Patient Active Problem List   Diagnosis Date Noted  . UTI (lower urinary tract infection) 08/07/2015  . Dizziness 08/07/2015  . Acute renal failure superimposed on stage 3 chronic kidney disease ([place]) 08/07/2015  . Hyperkalemia 08/07/2015  . Left inguinal hernia 05/15/2013  . Diabetes mellitus without complication ([place]) 46/27/0350  . HLD (hyperlipidemia) 03/08/2006  . HYPERTENSION, BENIGN SYSTEMIC 03/08/2006  . 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 PROTEINURIA 03/08/2006    Past Surgical History:  Procedure Laterality Date  . INGUINAL HERNIA REPAIR Left 06/09/2013   Procedure: OPEN REPAIR LEFT INGUINAL HERNIA;  Surgeon: [name], MD;  Location: [facility];  Service: General;  Laterality: Left;  . INSERTION OF MESH Left 06/09/2013   Procedure: INSERTION OF MESH;  Surgeon: [name], MD;  Location: [facility];   Service: General;  Laterality: Left;       Home Medications    Prior to Admission medications   Medication Sig Start Date End Date Taking? Authorizing Provider  amLODipine (NORVASC) 5 MG tablet Take 1 tablet (5 mg total) by mouth daily. Replaces Edarbi Patient not taking: Reported on 03/29/2016 08/09/15   Jani Gravel, MD  aspirin EC 81 MG tablet Take 81 mg by mouth every evening.    [provider]  cephALEXin (KEFLEX) 500 MG capsule Take 1 capsule (500 mg total) by mouth every 12 (twelve) hours. Patient not taking: Reported on 03/29/2016 08/09/15   Jani Gravel, MD  cholecalciferol (VITAMIN D) 1000 units tablet Take 1,000 Units by mouth 2 (two) times daily.    [provider]  furosemide (LASIX) 20 MG tablet  12/29/15   [provider]  glimepiride (AMARYL) 4 MG tablet Take 4 mg by mouth 2 (two) times daily.     [provider]  Insulin Glargine (TOUJEO SOLOSTAR) 300 UNIT/ML SOPN Inject 10 Units into the skin at bedtime.    [provider]  Multiple Vitamin (MULTIVITAMIN WITH MINERALS) TABS tablet Take 1 tablet by mouth daily.    [provider]  pioglitazone (ACTOS) 15 MG tablet Take 1 tablet (15 mg total) by mouth daily. Patient not taking: Reported on 03/29/2016 08/09/15   Jani Gravel, MD  pravastatin (PRAVACHOL) 40 MG tablet Take 40 mg by mouth every evening.     [provider]  Family History Family History  Problem Relation Age of Onset  . Cancer Mother 48       breast  . COPD Father 1       Congestive heart failure    Social History Social History   Tobacco Use  . Smoking status: Former Smoker    Types: Cigars  . Smokeless tobacco: Never Used  Substance Use Topics  . Alcohol use: No  . Drug use: No     Allergies   Patient has no known allergies.   Review of Systems Review of Systems  Constitutional: Negative for activity change.  Respiratory: Negative for shortness of breath.   Cardiovascular:  Negative for chest pain.  Gastrointestinal: Negative for abdominal pain.  Neurological: Positive for dizziness, facial asymmetry and weakness.  All other systems reviewed and are negative.    Physical Exam Updated Vital Signs BP 129/90   Pulse 73   Temp (!) 97 F (36.1 C)   Resp 16   Ht 5\' 5"  (1.651 m)   Wt 103 kg (227 lb)   SpO2 98%   BMI 37.77 kg/m   Physical Exam  Constitutional: He is oriented to person, place, and time. He appears well-nourished.  HENT:  Head: Normocephalic.  Eyes: Conjunctivae and EOM are normal. Right eye exhibits no discharge. Left eye exhibits no discharge.  Cardiovascular: Normal rate and regular rhythm.  Pulmonary/Chest: Effort normal and breath sounds normal. No respiratory distress.  Abdominal: Soft. He exhibits no distension. There is no tenderness.  Neurological: He is oriented to person, place, and time.  Patient may have a slight facial droop on the left-hand side with slight weakness in the right hand.  Is difficult to tell whether this is old or new.  Patient does have unsteadiness.  Mild dysmetria bilaterally.  Skin: Skin is warm and dry. He is not diaphoretic.  Psychiatric: He has a normal mood and affect. His behavior is normal.     ED Treatments / Results  Labs (all labs ordered are listed, but only abnormal results are displayed) Labs Reviewed  BASIC METABOLIC PANEL - Abnormal; Notable for the following components:      Result Value   CO2 21 (*)    Glucose, Bld 191 (*)    BUN 57 (*)    Creatinine, Ser 2.75 (*)    GFR calc non Af Amer 20 (*)    GFR calc Af Amer 23 (*)    All other components within normal limits  CBC - Abnormal; Notable for the following components:   RBC 4.18 (*)    Hemoglobin 11.6 (*)    HCT 36.4 (*)    All other components within normal limits    EKG  EKG Interpretation  Date/Time:  Saturday January 06 2017 08:41:29 EST Ventricular Rate:  86 PR Interval:  192 QRS Duration: 78 QT  Interval:  378 QTC Calculation: 452 R Axis:   28 Text Interpretation:  Normal sinus rhythm Normal ECG Normal sinus rhythm Confirmed by Thomasene Lot, [place] ([id number]) on 01/06/2017 11:17:05 AM Also confirmed by Thomasene Lot, Tammi Boulier [phone]), editor Philomena Doheny [phone])  on 01/06/2017 11:43:02 AM       Radiology Mr Brain Wo Contrast  Result Date: 01/06/2017 CLINICAL DATA:  New onset of dizziness over the last 2 weeks. Ataxia, stroke suspected. EXAM: MRI HEAD WITHOUT CONTRAST TECHNIQUE: Multiplanar, multiecho pulse sequences of the brain and surrounding structures were obtained without intravenous contrast. COMPARISON:  MRI brain 02/07/2016 FINDINGS: Brain: Diffusion-weighted images demonstrate no evidence for  acute or subacute infarction. Atrophy and white matter changes are stable, within normal limits for age. No acute hemorrhage is present. The ventricles are proportionate to the degree of atrophy. A remote lacunar infarct is present in the left thalamus. No significant extra-axial fluid collections are present. 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 No other significant extra-axial lesions are present. Vascular: Flow is present in the major intracranial arteries. Skull and upper cervical spine: The skullbase is within normal limits. The craniocervical junction is normal. Midline sagittal structures are unremarkable. Sinuses/Orbits: Mild mucosal thickening is present in the ethmoid air cells and left greater than right maxillary sinus. There are no fluid levels. The mastoid air cells are clear. Globes and orbits are within normal limits. IMPRESSION: 1. No acute intracranial abnormality. 2. Stable remote lacunar infarct of the left thalamus. 3. Stable planum sphenoidale meningioma. Electronically Signed   By: San Morelle M.D.   On: 01/06/2017 13:45    Procedures Procedures (including critical care time)  Medications Ordered in ED Medications - No data to  display   Initial Impression / Assessment and Plan / ED Course  I have reviewed the triage vital signs and the nursing notes.  Pertinent labs & imaging results that were available during my care of the patient were reviewed by me and considered in my medical decision making (see chart for details).     Patient is an 83 year old male with history of diabetes hypertension hyperlipidemia and history of "mini strokes".  He is presenting today with dizziness.  He reports dizziness going on for last 2 weeks.  He went to primary care physician and was diagnosed with vertigo.  Patient was started on meclizine.  Patient symptoms do sound vertiginous.  He reports they are only bad when he lies down and with his head looking to the right-hand side.  So very typical of benign positional vertigo.  However patient's wife reports that he is also unable to walk without assistance since this started.  He  reports unsteadiness on his feet.  12:06 PM Although the symptoms sound so classic for peripheral vertigo, patient's unsteadiness does not fit with that clinical picture.  Given his history of stroke, risk factors, age we will do an MRI here.  1:53 PM MRI shows no new stroke.  Shows old thalamic stroke.  Thus I think the symptoms really are from a peripheral.  We have printed out some information about self treatment of benign positional vertigo.  Will refer to ENT in order to get vestibular physical therapy.   Final Clinical Impressions(s) / ED Diagnoses   Final diagnoses:  None    ED Discharge Orders    None       Marylan Glore, Fredia Sorrow, MD 01/06/17 1355

## 2017-01-06 NOTE — ED Triage Notes (Signed)
Pt presents for evaluation of ongoing dizziness and off balance x 3 weeks. Seen by pcp and taking meclizine x 3 weeks with no improvement. No focal neuro deficits.

## 2017-01-06 NOTE — ED Notes (Signed)
Pt to MRI

## 2017-01-11 DIAGNOSIS — Z09 Encounter for follow-up examination after completed treatment for conditions other than malignant neoplasm: Secondary | ICD-10-CM | POA: Diagnosis not present

## 2017-01-11 DIAGNOSIS — H8113 Benign paroxysmal vertigo, bilateral: Secondary | ICD-10-CM | POA: Diagnosis not present

## 2017-01-11 DIAGNOSIS — Z7982 Long term (current) use of aspirin: Secondary | ICD-10-CM | POA: Diagnosis not present

## 2017-01-11 DIAGNOSIS — H539 Unspecified visual disturbance: Secondary | ICD-10-CM | POA: Diagnosis not present

## 2017-01-17 DIAGNOSIS — E8770 Fluid overload, unspecified: Secondary | ICD-10-CM | POA: Diagnosis not present

## 2017-01-17 DIAGNOSIS — N184 Chronic kidney disease, stage 4 (severe): Secondary | ICD-10-CM | POA: Diagnosis not present

## 2017-02-06 DIAGNOSIS — R270 Ataxia, unspecified: Secondary | ICD-10-CM | POA: Diagnosis not present

## 2017-02-07 DIAGNOSIS — R972 Elevated prostate specific antigen [PSA]: Secondary | ICD-10-CM | POA: Diagnosis not present

## 2017-02-13 ENCOUNTER — Encounter: Payer: Self-pay | Admitting: *Deleted

## 2017-02-14 ENCOUNTER — Ambulatory Visit: Payer: Medicare Other | Admitting: Diagnostic Neuroimaging

## 2017-02-14 ENCOUNTER — Encounter: Payer: Self-pay | Admitting: Diagnostic Neuroimaging

## 2017-02-14 VITALS — BP 100/57 | HR 98 | Ht 65.0 in | Wt 228.6 lb

## 2017-02-14 DIAGNOSIS — E1142 Type 2 diabetes mellitus with diabetic polyneuropathy: Secondary | ICD-10-CM | POA: Diagnosis not present

## 2017-02-14 DIAGNOSIS — R269 Unspecified abnormalities of gait and mobility: Secondary | ICD-10-CM | POA: Diagnosis not present

## 2017-02-14 NOTE — Patient Instructions (Signed)
BALANCE DIFFICULTY (due to diabetes or other cause) - check labs - consider MRI lumbar spine (but patient not interested in surgery or injections at this time, so we will hold off) - physical therapy evaluation - continue diabetes control - use cane / walker

## 2017-02-14 NOTE — Progress Notes (Signed)
[facility]  PATIENT: Justin Holloway DOB: [birth year]  REFERRING CLINICIAN: Tanja Port, PA-c HISTORY FROM: patient REASON FOR VISIT: new consult / existing patient   HISTORICAL  CHIEF COMPLAINT:  Chief Complaint  Patient presents with  . Ataxia    rm 6, new problem, "for past year I can't walk straight, I stagger, sometimes stagger backwards;  feel like I may fall on my head"    HISTORY OF PRESENT ILLNESS:   UPDATE (02/14/17, [facility]P):  Since last visit, patient continues to have balance and walking problems. Worse since last visit. No falls. Uses a cane at times. A1c was 9.5 last month. Not tried PT. Has been to ENT. No vertigo. Patient feels like "staggering" when walking. Some early morning low back pain, but improves later in day.   PRIOR HPI (06/17/14): 84 year old right-handed male with hypertension, diabetes, hypercholesterolemia, here for evaluation of gait difficulty. Patient reports at least 3-6 months of progressive gait and balance difficulty. He had some balance difficulties as long as 4 years ago. He reports gradual onset progressive symptoms. He denies any sudden unilateral focal numbness, weakness, tingling. 3 months ago he had an episode of slurred speech lasting for 3 weeks, which he did not seek medical attention for. This was noticed by patient's wife. Patient did not notice much slurred speech.  Patient denies any low back pain, neck pain, numbness or tingling. He has diabetes and recent A1c was 9.5. He is scheduled to see a diabetes specialist in a few months.  Patient tends to stagger and stumble, especially at nighttime, in dark situations. He tends to stumble backwards into the right side. Symptoms fluctuate.  Patient had MRI in January 2016 showing a midline planum sphenoidal meningioma, mild small vessel disease and chronic left thalamic lacunar infarct. Patient reports an MRI 5 years ago which also showed meningioma, but we do not have those  results to compare. Patient denies any right-sided numbness or tingling with sudden onset that he can recall.   REVIEW OF SYSTEMS: Full 14 system review of systems performed and negative except: weight gain fatigue joint pain joint weakness.   ALLERGIES: No Known Allergies  HOME MEDICATIONS: Outpatient Medications Prior to Visit  Medication Sig Dispense Refill  . aspirin EC 81 MG tablet Take 81 mg by mouth every evening.    . cholecalciferol (VITAMIN D) 1000 units tablet Take 1,000 Units by mouth 2 (two) times daily.    . furosemide (LASIX) 20 MG tablet     . hydrALAZINE (APRESOLINE) 25 MG tablet Take 25 mg by mouth 3 (three) times daily.    . Insulin Degludec (TRESIBA FLEXTOUCH) 200 UNIT/ML SOPN Inject 24 Units into the skin at bedtime.    . Multiple Vitamin (MULTIVITAMIN WITH MINERALS) TABS tablet Take 1 tablet by mouth daily.    . pravastatin (PRAVACHOL) 40 MG tablet Take 40 mg by mouth every evening.     . meclizine (ANTIVERT) 12.5 MG tablet TK 1 T PO TID PRF DIZZINESS  2  . amLODipine (NORVASC) 5 MG tablet Take 1 tablet (5 mg total) by mouth daily. Replaces Justin Holloway (Patient not taking: Reported on 03/29/2016) 30 tablet 0  . cephALEXin (KEFLEX) 500 MG capsule Take 1 capsule (500 mg total) by mouth every 12 (twelve) hours. (Patient not taking: Reported on 03/29/2016) 12 capsule 0  . glimepiride (AMARYL) 4 MG tablet Take 4 mg by mouth 2 (two) times daily.     . Insulin Glargine (TOUJEO SOLOSTAR) 300 UNIT/ML SOPN Inject  10 Units into the skin at bedtime.    . pioglitazone (ACTOS) 15 MG tablet Take 1 tablet (15 mg total) by mouth daily. (Patient not taking: Reported on 03/29/2016) 30 tablet 0   No facility-administered medications prior to visit.     PAST MEDICAL HISTORY: Past Medical History:  Diagnosis Date  . Arthritis   . CKD (chronic kidney disease), stage III ([place])   . Diabetes mellitus without complication ([place])   . Glaucoma    "blurred vision", see Dr Justin Holloway yearly  .  Hyperlipidemia   . Hypertension     PAST SURGICAL HISTORY: Past Surgical History:  Procedure Laterality Date  . INGUINAL HERNIA REPAIR Left 06/09/2013   Procedure: OPEN REPAIR LEFT INGUINAL HERNIA;  Surgeon: Justin Hector, MD;  Location: [facility];  Service: General;  Laterality: Left;  . INSERTION OF MESH Left 06/09/2013   Procedure: INSERTION OF MESH;  Surgeon: Justin Hector, MD;  Location: [facility];  Service: General;  Laterality: Left;    FAMILY HISTORY: Family History  Problem Relation Age of Onset  . Cancer Mother 103       breast  . COPD Father 90       Congestive heart failure    SOCIAL HISTORY:  Social History   Socioeconomic History  . Marital status: Married    Spouse name: Justin Holloway  . Number of children: 8  . Years of education: 31  . Highest education level: Not on file  Social Needs  . Financial resource strain: Not on file  . Food insecurity - worry: Not on file  . Food insecurity - inability: Not on file  . Transportation needs - medical: Not on file  . Transportation needs - non-medical: Not on file  Occupational History  . Occupation: retired  Tobacco Use  . Smoking status: Former Smoker    Types: Cigars  . Smokeless tobacco: Never Used  Substance and Sexual Activity  . Alcohol use: No  . Drug use: No  . Sexual activity: Not on file  Other Topics Concern  . Not on file  Social History Narrative   Lives with wife at home    caffeine- coffee 1 cup daily     PHYSICAL EXAM  Vitals:   02/14/17 1002  BP: (!) 100/57  Pulse: 98  Weight: 228 lb 9.6 oz (103.7 kg)  Height: 5\' 5"  (1.651 m)    Body mass index is 38.04 kg/m.  No exam data present  No flowsheet data found.  GENERAL EXAM: Patient is in no distress; well developed, nourished and groomed; neck is supple  CARDIOVASCULAR: Regular rate and rhythm, no murmurs, no carotid bruits  NEUROLOGIC: MENTAL STATUS: awake, alert, oriented to person, place and time, recent and remote memory  intact, normal attention and concentration, language fluent, comprehension intact, naming intact, fund of knowledge appropriate CRANIAL NERVE: no papilledema on fundoscopic exam, pupils equal and reactive to light, visual fields full to confrontation, extraocular muscles intact, no nystagmus, facial sensation and strength symmetric, hearing intact, palate elevates symmetrically, uvula midline, shoulder shrug symmetric, tongue midline. MOTOR: normal bulk and tone, full strength in the BUE, BLE; EXCEPT HIP FLEXORS 4/5 SENSORY: normal and symmetric to light touch; DECR TEMP IN HANDS AND LEGS; ABSENT VIB AT ANKLES AND TOES; DECR PP IN FEET/LEGS COORDINATION: finger-nose-finger, fine finger movements normal REFLEXES: deep tendon reflexes ABSENT THROUGHOUT GAIT/STATION: narrow based gait; STOOPED POSTURE, LIMPING, SLOW UNSTEADY    DIAGNOSTIC DATA (LABS, IMAGING, TESTING) - I reviewed patient records,  labs, notes, testing and imaging myself where available.  Lab Results  Component Value Date   WBC 4.8 01/06/2017   HGB 11.6 (L) 01/06/2017   HCT 36.4 (L) 01/06/2017   MCV 87.1 01/06/2017   PLT 202 01/06/2017      Component Value Date/Time   NA 135 01/06/2017 0847   K 4.5 01/06/2017 0847   CL 105 01/06/2017 0847   CO2 21 (L) 01/06/2017 0847   GLUCOSE 191 (H) 01/06/2017 0847   BUN 57 (H) 01/06/2017 0847   CREATININE 2.75 (H) 01/06/2017 0847   CALCIUM 9.0 01/06/2017 0847   PROT 6.0 (L) 08/09/2015 0649   ALBUMIN 3.1 (L) 08/09/2015 0649   AST 12 (L) 08/09/2015 0649   ALT 8 (L) 08/09/2015 0649   ALKPHOS 49 08/09/2015 0649   BILITOT 0.4 08/09/2015 0649   GFRNONAA 20 (L) 01/06/2017 0847   GFRAA 23 (L) 01/06/2017 0847   Lab Results  Component Value Date   CHOL 131 08/08/2015   HDL 33 (L) 08/08/2015   LDLCALC 81 08/08/2015   TRIG 83 08/08/2015   CHOLHDL 4.0 08/08/2015   Lab Results  Component Value Date   HGBA1C 8.9 (H) 08/08/2015   No results found for: VITAMINB12 No results found  for: TSH  Jan 2019 A1c - 9.5   02/04/14 MRI brain [I reviewed images myself and agree with interpretation. -VRP]  1. 3.5 cm extra-axial mass along the planum sphenoidale most consistent with a meningioma.  2. Mild chronic small vessel ischemic disease. Chronic left thalamic lacunar infarct.  08/07/15 MRI brain 1. Stable planum sphenoidale meningioma. 2. No acute or focal abnormality to explain the patient's symptoms. 3. Remote lacunar infarct of the left thalamus. 4. Mild atrophy and white matter changes otherwise within normal limits for age.  02/07/16 MRI brain 1. No acute intracranial abnormality identified. 2. Stable mild chronic microvascular ischemic changes of the brain, parenchymal volume loss, and chronic left thalamus infarct. 3. Stable planum sphenoidale meningioma. 4. Mild paranasal sinus disease and trace left mastoid effusion  01/06/17 MRI brain (with and without)  1. No acute intracranial abnormality. 2. Stable remote lacunar infarct of the left thalamus. 3. Stable planum sphenoidale meningioma.     ASSESSMENT AND PLAN  84 y.o. year old male here with progressive balance diff, hip flexor weakness and sensory changes in feet. This is likely due to diabetic neuropathy +/- diabetic lumbosacral plexopathy. Will setup PT eval. He may need a cane / walker to help reduce fall risk.   Also with chronic small vessel ischemic disease due to HTN, DM, hypercholesterolemia. The chronic left thalamic lacunar infarct does not have any clinical symptoms that patient recalls, and may have been a silent infarct. --> recommend aspirin and risk factor mgmt per PCP.  Also with incidental midline meningioma --> stable from 2016 - 2018.   Dx:   Gait difficulty  Diabetic polyneuropathy associated with type 2 diabetes mellitus (HCC)   PLAN:  GAIT DIFFICULTY (due to diabetic neuropathy / plexopathy) - check labs (eval for neuropathy, myopathy or neuromuscular junction dz) -  consider MRI lumbar spine (but patient not interested in surgery or injections at this time, so we will hold off) - PT evaluation - diabetes control - use cane / walker  Orders Placed This Encounter  Procedures  . Vitamin B12  . Hemoglobin A1c  . TSH  . CK  . Aldolase  . Acetylcholine Receptor, Binding  . Ambulatory referral to Physical Therapy   Return in about  6 months (around 08/14/2017).    Penni Bombard, MD 06/12/8935, 34:28 AM Certified in Neurology, Neurophysiology and Neuroimaging  [facility] [address], [place], [place] [address] [phone]

## 2017-02-15 LAB — VITAMIN B12: VITAMIN B 12: 1542 pg/mL — AB (ref 232–1245)

## 2017-02-15 LAB — CK: CK TOTAL: 159 U/L (ref 24–204)

## 2017-02-15 LAB — HEMOGLOBIN A1C
Est. average glucose Bld gHb Est-mCnc: 258 mg/dL
Hgb A1c MFr Bld: 10.6 % — ABNORMAL HIGH (ref 4.8–5.6)

## 2017-02-15 LAB — ACETYLCHOLINE RECEPTOR, BINDING

## 2017-02-15 LAB — TSH: TSH: 2.62 u[IU]/mL (ref 0.450–4.500)

## 2017-02-15 LAB — ALDOLASE: Aldolase: 4.5 U/L (ref 3.3–10.3)

## 2017-02-16 ENCOUNTER — Telehealth: Payer: Self-pay | Admitting: *Deleted

## 2017-02-16 NOTE — Telephone Encounter (Signed)
Spoke with patient and advised him his labs are unremarkable with the exception of worsening diabetes. advised he continue diabetes treatment with PCP.  Patient stated "I am doing the best I can. I eat right and take my medicine." He verbalized understanding, appreciation of call.

## 2017-03-27 DIAGNOSIS — N08 Glomerular disorders in diseases classified elsewhere: Secondary | ICD-10-CM | POA: Diagnosis not present

## 2017-03-27 DIAGNOSIS — N184 Chronic kidney disease, stage 4 (severe): Secondary | ICD-10-CM | POA: Diagnosis not present

## 2017-03-27 DIAGNOSIS — E1122 Type 2 diabetes mellitus with diabetic chronic kidney disease: Secondary | ICD-10-CM | POA: Diagnosis not present

## 2017-03-27 DIAGNOSIS — I129 Hypertensive chronic kidney disease with stage 1 through stage 4 chronic kidney disease, or unspecified chronic kidney disease: Secondary | ICD-10-CM | POA: Diagnosis not present

## 2017-03-27 DIAGNOSIS — R0602 Shortness of breath: Secondary | ICD-10-CM | POA: Diagnosis not present

## 2017-04-24 ENCOUNTER — Ambulatory Visit (HOSPITAL_COMMUNITY)
Admission: EM | Admit: 2017-04-24 | Discharge: 2017-04-24 | Disposition: A | Discharge status: Home / Self Care | Payer: Medicare Other

## 2017-04-24 ENCOUNTER — Encounter (HOSPITAL_COMMUNITY): Payer: Self-pay | Admitting: Emergency Medicine

## 2017-04-24 DIAGNOSIS — S0181XA Laceration without foreign body of other part of head, initial encounter: Secondary | ICD-10-CM

## 2017-04-24 NOTE — ED Triage Notes (Signed)
Pt sts car hood fell on his face today; pt with laceration to bridge of his nose and abrasion to nose

## 2017-04-24 NOTE — Discharge Instructions (Signed)
You may apply neosporin to other areas of face that were scraped. We applied skin glue which should slowly come off on its own.   Please go to emergency room if developing fainting, changes from baseline, difficulty walking, difficulty speaking, weakness on one side, altered mental status

## 2017-04-25 NOTE — ED Provider Notes (Signed)
[facility]    CSN: [id number] Arrival date & time: 04/24/17  1910     History   Chief Complaint Chief Complaint  Patient presents with  . Laceration    HPI Justin Holloway is a 84 y.o. male presenting today for evaluation of a laceration to his nose. Earlier today the hood of a car fell on his head causing the laceration to the nose. States that it was bleeding off and on for about 1 hour. Patient is not on any blood thinners, does take daily 81 mg aspirin. He denies being in pain, HA, changes in vision, difficulty speaking, one sided weakness. Family with him denies change from baseline but were concerned because he also had a knot on his head.   HPI  Past Medical History:  Diagnosis Date  . Arthritis   . CKD (chronic kidney disease), stage III ([place])   . Diabetes mellitus without complication ([place])   . Glaucoma    "blurred vision", see Dr Gershon Crane yearly  . Hyperlipidemia   . Hypertension     Patient Active Problem List   Diagnosis Date Noted  . UTI (lower urinary tract infection) 08/07/2015  . Dizziness 08/07/2015  . Acute renal failure superimposed on stage 3 chronic kidney disease ([place]) 08/07/2015  . Hyperkalemia 08/07/2015  . Left inguinal hernia 05/15/2013  . Diabetes mellitus without complication ([place]) 41/32/4401  . HLD (hyperlipidemia) 03/08/2006  . HYPERTENSION, BENIGN SYSTEMIC 03/08/2006  . 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 PROTEINURIA 03/08/2006    Past Surgical History:  Procedure Laterality Date  . INGUINAL HERNIA REPAIR Left 06/09/2013   Procedure: OPEN REPAIR LEFT INGUINAL HERNIA;  Surgeon: [name], MD;  Location: [facility];  Service: General;  Laterality: Left;  . INSERTION OF MESH Left 06/09/2013   Procedure: INSERTION OF MESH;  Surgeon: [name], MD;  Location: [facility];  Service: General;  Laterality: Left;       Home Medications    Prior to Admission medications   Medication Sig Start Date End Date Taking? Authorizing  Provider  aspirin EC 81 MG tablet Take 81 mg by mouth every evening.    [provider]  cholecalciferol (VITAMIN D) 1000 units tablet Take 1,000 Units by mouth 2 (two) times daily.    [provider]  furosemide (LASIX) 20 MG tablet  12/29/15   [provider]  hydrALAZINE (APRESOLINE) 25 MG tablet Take 25 mg by mouth 3 (three) times daily.    [provider]  Insulin Degludec (TRESIBA FLEXTOUCH) 200 UNIT/ML SOPN Inject 24 Units into the skin at bedtime.    [provider]  meclizine (ANTIVERT) 12.5 MG tablet TK 1 T PO TID PRF DIZZINESS 02/07/17   [provider]  Multiple Vitamin (MULTIVITAMIN WITH MINERALS) TABS tablet Take 1 tablet by mouth daily.    [provider]  pravastatin (PRAVACHOL) 40 MG tablet Take 40 mg by mouth every evening.     [provider]    Family History Family History  Problem Relation Age of Onset  . Cancer Mother 77       breast  . COPD Father 81       Congestive heart failure    Social History Social History   Tobacco Use  . Smoking status: Former Smoker    Types: Cigars  . Smokeless tobacco: Never Used  Substance Use Topics  . Alcohol use: No  . Drug use: No     Allergies   Patient  has no known allergies.   Review of Systems Review of Systems  Constitutional: Negative for chills and fever.  HENT: Negative for trouble swallowing.   Eyes: Negative for pain and visual disturbance.  Respiratory: Negative for cough and shortness of breath.   Cardiovascular: Negative for chest pain and palpitations.  Gastrointestinal: Negative for abdominal pain, nausea and vomiting.  Musculoskeletal: Negative for arthralgias and back pain.  Skin: Positive for color change and wound.  Neurological: Negative for dizziness, seizures, syncope, weakness, light-headedness and headaches.  Psychiatric/Behavioral: Negative for confusion.  All other systems reviewed and are negative.    Physical  Exam Triage Vital Signs ED Triage Vitals [04/24/17 1931]  Enc Vitals Group     BP (!) 163/72     Pulse Rate 98     Resp 18     Temp 98.2 F (36.8 C)     Temp Source Oral     SpO2 99 %     Weight      Height      Head Circumference      Peak Flow      Pain Score      Pain Loc      Pain Edu?      Excl. in [place]?    No data found.  Updated Vital Signs BP (!) 163/72 (BP Location: Left Arm)   Pulse 98   Temp 98.2 F (36.8 C) (Oral)   Resp 18   SpO2 99%   Visual Acuity Right Eye Distance:   Left Eye Distance:   Bilateral Distance:    Right Eye Near:   Left Eye Near:    Bilateral Near:     Physical Exam  Constitutional: He is oriented to person, place, and time. He appears well-developed and well-nourished.  HENT:  Head: Normocephalic and atraumatic.  Nose: Nose normal.  Knot/swelling to left forehead, no crepitus or tenderness. Nasal bridge without abnormality, or crepitus  Eyes: Pupils are equal, round, and reactive to light. Conjunctivae and EOM are normal.  Neck: Neck supple.  Cardiovascular: Normal rate and regular rhythm.  No murmur heard. Pulmonary/Chest: Effort normal and breath sounds normal. No respiratory distress.  Abdominal: Soft. There is no tenderness.  Musculoskeletal: He exhibits no edema.  Neurological: He is alert and oriented to person, place, and time.  Cranial nerves II-XII grossly intact, strength 5/5 and equal bilaterally at shoulders and hips. Reflexes not obtainable bilaterally, at patellar and biceps tendon. Normal finger to nose and RAM. Ambulating from chair to exam tab le without abnormality.  Skin: Skin is warm and dry.  Small superficial 0.5 laceration to bridge of nose, 2 scraped areas on cartilage of nose, no open wound.   Psychiatric: He has a normal mood and affect.  Nursing note and vitals reviewed.    UC Treatments / Results  Labs (all labs ordered are listed, but only abnormal results are displayed) Labs Reviewed - No data  to display  EKG None Radiology No results found.  Procedures Procedures (including critical care time)  Medications Ordered in UC Medications - No data to display   Initial Impression / Assessment and Plan / UC Course  I have reviewed the triage vital signs and the nursing notes.  Pertinent labs & imaging results that were available during my care of the patient were reviewed by me and considered in my medical decision making (see chart for details).     Superficial laceration to nose, no focal neuro deficits. Offered stiches as laceration  on face, patient opted for dermabond. Dermabond applied. Discussed strict return precautions. Patient verbalized understanding and is agreeable with plan.   Final Clinical Impressions(s) / UC Diagnoses   Final diagnoses:  Facial laceration, initial encounter    ED Discharge Orders    None       Controlled Substance Prescriptions [place] Controlled Substance Registry consulted? Not Applicable   Janith Lima, [place] 04/25/17 [phone]

## 2017-05-10 DIAGNOSIS — E8770 Fluid overload, unspecified: Secondary | ICD-10-CM | POA: Diagnosis not present

## 2017-05-10 DIAGNOSIS — N184 Chronic kidney disease, stage 4 (severe): Secondary | ICD-10-CM | POA: Diagnosis not present

## 2017-06-25 DIAGNOSIS — N184 Chronic kidney disease, stage 4 (severe): Secondary | ICD-10-CM | POA: Diagnosis not present

## 2017-06-25 DIAGNOSIS — E1122 Type 2 diabetes mellitus with diabetic chronic kidney disease: Secondary | ICD-10-CM | POA: Diagnosis not present

## 2017-06-25 DIAGNOSIS — N08 Glomerular disorders in diseases classified elsewhere: Secondary | ICD-10-CM | POA: Diagnosis not present

## 2017-06-25 DIAGNOSIS — I129 Hypertensive chronic kidney disease with stage 1 through stage 4 chronic kidney disease, or unspecified chronic kidney disease: Secondary | ICD-10-CM | POA: Diagnosis not present

## 2017-08-21 ENCOUNTER — Ambulatory Visit: Payer: Medicare Other | Admitting: Diagnostic Neuroimaging

## 2017-09-06 DIAGNOSIS — H8111 Benign paroxysmal vertigo, right ear: Secondary | ICD-10-CM | POA: Insufficient documentation

## 2017-09-06 DIAGNOSIS — H903 Sensorineural hearing loss, bilateral: Secondary | ICD-10-CM | POA: Insufficient documentation

## 2017-09-08 ENCOUNTER — Encounter (HOSPITAL_COMMUNITY): Payer: Self-pay | Admitting: Emergency Medicine

## 2017-09-08 ENCOUNTER — Emergency Department (HOSPITAL_COMMUNITY): Payer: Medicare Other

## 2017-09-08 ENCOUNTER — Other Ambulatory Visit: Payer: Self-pay

## 2017-09-08 ENCOUNTER — Observation Stay (HOSPITAL_COMMUNITY)
Admission: EM | Admit: 2017-09-08 | Discharge: 2017-09-10 | Disposition: A | Discharge status: Home / Self Care | Payer: Medicare Other | Attending: Internal Medicine | Admitting: Internal Medicine

## 2017-09-08 DIAGNOSIS — E1122 Type 2 diabetes mellitus with diabetic chronic kidney disease: Secondary | ICD-10-CM | POA: Insufficient documentation

## 2017-09-08 DIAGNOSIS — E785 Hyperlipidemia, unspecified: Secondary | ICD-10-CM | POA: Diagnosis not present

## 2017-09-08 DIAGNOSIS — I129 Hypertensive chronic kidney disease with stage 1 through stage 4 chronic kidney disease, or unspecified chronic kidney disease: Secondary | ICD-10-CM | POA: Insufficient documentation

## 2017-09-08 DIAGNOSIS — Z87891 Personal history of nicotine dependence: Secondary | ICD-10-CM | POA: Insufficient documentation

## 2017-09-08 DIAGNOSIS — Z79899 Other long term (current) drug therapy: Secondary | ICD-10-CM | POA: Diagnosis not present

## 2017-09-08 DIAGNOSIS — E1139 Type 2 diabetes mellitus with other diabetic ophthalmic complication: Secondary | ICD-10-CM | POA: Insufficient documentation

## 2017-09-08 DIAGNOSIS — E1142 Type 2 diabetes mellitus with diabetic polyneuropathy: Secondary | ICD-10-CM | POA: Diagnosis not present

## 2017-09-08 DIAGNOSIS — E119 Type 2 diabetes mellitus without complications: Secondary | ICD-10-CM

## 2017-09-08 DIAGNOSIS — Z8673 Personal history of transient ischemic attack (TIA), and cerebral infarction without residual deficits: Secondary | ICD-10-CM | POA: Insufficient documentation

## 2017-09-08 DIAGNOSIS — Z7982 Long term (current) use of aspirin: Secondary | ICD-10-CM | POA: Insufficient documentation

## 2017-09-08 DIAGNOSIS — M1990 Unspecified osteoarthritis, unspecified site: Secondary | ICD-10-CM | POA: Diagnosis not present

## 2017-09-08 DIAGNOSIS — D329 Benign neoplasm of meninges, unspecified: Secondary | ICD-10-CM | POA: Insufficient documentation

## 2017-09-08 DIAGNOSIS — N529 Male erectile dysfunction, unspecified: Secondary | ICD-10-CM | POA: Insufficient documentation

## 2017-09-08 DIAGNOSIS — N183 Chronic kidney disease, stage 3 (moderate): Secondary | ICD-10-CM | POA: Diagnosis not present

## 2017-09-08 DIAGNOSIS — H42 Glaucoma in diseases classified elsewhere: Secondary | ICD-10-CM | POA: Diagnosis not present

## 2017-09-08 DIAGNOSIS — Z794 Long term (current) use of insulin: Secondary | ICD-10-CM | POA: Insufficient documentation

## 2017-09-08 DIAGNOSIS — R42 Dizziness and giddiness: Principal | ICD-10-CM | POA: Insufficient documentation

## 2017-09-08 HISTORY — DX: Transient cerebral ischemic attack, unspecified: G45.9

## 2017-09-08 LAB — CBC
HEMATOCRIT: 36.3 % — AB (ref 39.0–52.0)
HEMOGLOBIN: 11.3 g/dL — AB (ref 13.0–17.0)
MCH: 28.9 pg (ref 26.0–34.0)
MCHC: 31.1 g/dL (ref 30.0–36.0)
MCV: 92.8 fL (ref 78.0–100.0)
Platelets: 195 10*3/uL (ref 150–400)
RBC: 3.91 MIL/uL — ABNORMAL LOW (ref 4.22–5.81)
RDW: 12.3 % (ref 11.5–15.5)
WBC: 6.3 10*3/uL (ref 4.0–10.5)

## 2017-09-08 LAB — COMPREHENSIVE METABOLIC PANEL
ALT: 12 U/L (ref 0–44)
AST: 22 U/L (ref 15–41)
Albumin: 3.7 g/dL (ref 3.5–5.0)
Alkaline Phosphatase: 67 U/L (ref 38–126)
Anion gap: 12 (ref 5–15)
BILIRUBIN TOTAL: 0.7 mg/dL (ref 0.3–1.2)
BUN: 53 mg/dL — AB (ref 8–23)
CALCIUM: 8.8 mg/dL — AB (ref 8.9–10.3)
CO2: 21 mmol/L — ABNORMAL LOW (ref 22–32)
CREATININE: 2.9 mg/dL — AB (ref 0.61–1.24)
Chloride: 102 mmol/L (ref 98–111)
GFR, EST AFRICAN AMERICAN: 21 mL/min — AB (ref >60)
GFR, EST NON AFRICAN AMERICAN: 18 mL/min — AB (ref >60)
Glucose, Bld: 159 mg/dL — ABNORMAL HIGH (ref 70–99)
Potassium: 3.7 mmol/L (ref 3.5–5.1)
Sodium: 135 mmol/L (ref 135–145)
TOTAL PROTEIN: 6.7 g/dL (ref 6.5–8.1)

## 2017-09-08 LAB — DIFFERENTIAL
Abs Immature Granulocytes: 0 10*3/uL (ref 0.0–0.1)
Basophils Absolute: 0 10*3/uL (ref 0.0–0.1)
Basophils Relative: 1 %
Eosinophils Absolute: 0.1 10*3/uL (ref 0.0–0.7)
Eosinophils Relative: 2 %
Immature Granulocytes: 0 %
Lymphocytes Relative: 37 %
Lymphs Abs: 2.3 10*3/uL (ref 0.7–4.0)
MONO ABS: 0.6 10*3/uL (ref 0.1–1.0)
Monocytes Relative: 10 %
Neutro Abs: 3.2 10*3/uL (ref 1.7–7.7)
Neutrophils Relative %: 50 %

## 2017-09-08 LAB — I-STAT TROPONIN, ED: Troponin i, poc: 0.02 ng/mL (ref 0.00–0.08)

## 2017-09-08 LAB — APTT: APTT: 29 s (ref 24–36)

## 2017-09-08 LAB — PROTIME-INR
INR: 1
Prothrombin Time: 13.1 seconds (ref 11.4–15.2)

## 2017-09-08 NOTE — ED Triage Notes (Addendum)
Pt states he went for a "vertigo test" on Thursday.  History of feeling like room is spinning only if lying on back or R side x 8 months.  Today he was sitting at daughter's house around 4:30pm and had sudden onset of feeling "woozy", off balance, and blurred vision in bilateral eyes.  Denies dizziness or feeling of room spinning.  Denies nausea. Denies weakness.  Pt has R arm drift.  VAN negative.  Reports history of TIA x 2.

## 2017-09-09 ENCOUNTER — Other Ambulatory Visit: Payer: Self-pay

## 2017-09-09 ENCOUNTER — Encounter (HOSPITAL_COMMUNITY): Payer: Self-pay | Admitting: Radiology

## 2017-09-09 ENCOUNTER — Emergency Department (HOSPITAL_COMMUNITY): Payer: Medicare Other

## 2017-09-09 DIAGNOSIS — R42 Dizziness and giddiness: Secondary | ICD-10-CM | POA: Diagnosis not present

## 2017-09-09 LAB — GLUCOSE, CAPILLARY: GLUCOSE-CAPILLARY: 173 mg/dL — AB (ref 70–99)

## 2017-09-09 LAB — CBG MONITORING, ED: GLUCOSE-CAPILLARY: 137 mg/dL — AB (ref 70–99)

## 2017-09-09 MED ORDER — MECLIZINE HCL 25 MG PO TABS
25.0000 mg | ORAL_TABLET | Freq: Once | ORAL | Status: AC
  Administered 2017-09-09: 25 mg via ORAL
  Filled 2017-09-09: qty 1

## 2017-09-09 MED ORDER — INSULIN GLARGINE 100 UNIT/ML ~~LOC~~ SOLN
26.0000 [IU] | Freq: Every day | SUBCUTANEOUS | Status: DC
  Administered 2017-09-09: 26 [IU] via SUBCUTANEOUS
  Filled 2017-09-09 (×2): qty 0.26

## 2017-09-09 MED ORDER — INSULIN DEGLUDEC 200 UNIT/ML ~~LOC~~ SOPN: 25.0000 [IU] | PEN_INJECTOR | Freq: Every day | SUBCUTANEOUS | Status: DC

## 2017-09-09 MED ORDER — SEMAGLUTIDE(0.25 OR 0.5MG/DOS) 2 MG/1.5ML ~~LOC~~ SOPN: 0.5000 mg | PEN_INJECTOR | SUBCUTANEOUS | Status: DC

## 2017-09-09 MED ORDER — INSULIN GLARGINE 100 UNIT/ML ~~LOC~~ SOLN
13.0000 [IU] | Freq: Every day | SUBCUTANEOUS | Status: DC
  Filled 2017-09-09: qty 0.13

## 2017-09-09 MED ORDER — SODIUM CHLORIDE 0.9% FLUSH: 3.0000 mL | INTRAVENOUS | Status: DC | PRN

## 2017-09-09 MED ORDER — DIAZEPAM 2 MG PO TABS
2.0000 mg | ORAL_TABLET | Freq: Once | ORAL | Status: AC
  Administered 2017-09-09: 2 mg via ORAL
  Filled 2017-09-09: qty 1

## 2017-09-09 MED ORDER — ADULT MULTIVITAMIN W/MINERALS CH: 1.0000 | ORAL_TABLET | Freq: Every day | ORAL | Status: DC

## 2017-09-09 MED ORDER — SODIUM CHLORIDE 0.9% FLUSH
3.0000 mL | Freq: Two times a day (BID) | INTRAVENOUS | Status: DC
  Administered 2017-09-09 – 2017-09-10 (×3): 3 mL via INTRAVENOUS

## 2017-09-09 MED ORDER — SODIUM CHLORIDE 0.9 % IV BOLUS
500.0000 mL | Freq: Once | INTRAVENOUS | Status: AC
  Administered 2017-09-09: 500 mL via INTRAVENOUS

## 2017-09-09 MED ORDER — DIAZEPAM 5 MG PO TABS: 2.5000 mg | ORAL_TABLET | Freq: Three times a day (TID) | ORAL | 0 refills | Status: DC | PRN

## 2017-09-09 MED ORDER — ENOXAPARIN SODIUM 40 MG/0.4ML ~~LOC~~ SOLN
40.0000 mg | SUBCUTANEOUS | Status: DC
  Administered 2017-09-09: 40 mg via SUBCUTANEOUS
  Filled 2017-09-09: qty 0.4

## 2017-09-09 MED ORDER — SODIUM CHLORIDE 0.9 % IV SOLN: 250.0000 mL | INTRAVENOUS | Status: DC | PRN

## 2017-09-09 MED ORDER — HYDRALAZINE HCL 25 MG PO TABS
25.0000 mg | ORAL_TABLET | Freq: Two times a day (BID) | ORAL | Status: DC
  Administered 2017-09-09 – 2017-09-10 (×2): 25 mg via ORAL
  Filled 2017-09-09 (×3): qty 1

## 2017-09-09 MED ORDER — PRAVASTATIN SODIUM 40 MG PO TABS
40.0000 mg | ORAL_TABLET | Freq: Every evening | ORAL | Status: DC
  Filled 2017-09-09: qty 1

## 2017-09-09 MED ORDER — ASPIRIN EC 81 MG PO TBEC
81.0000 mg | DELAYED_RELEASE_TABLET | Freq: Every evening | ORAL | Status: DC
  Administered 2017-09-09: 81 mg via ORAL
  Filled 2017-09-09: qty 1

## 2017-09-09 MED ORDER — VITAMIN D 1000 UNITS PO TABS
1000.0000 [IU] | ORAL_TABLET | Freq: Two times a day (BID) | ORAL | Status: DC
  Administered 2017-09-09 – 2017-09-10 (×2): 1000 [IU] via ORAL
  Filled 2017-09-09 (×2): qty 1

## 2017-09-09 MED ORDER — FUROSEMIDE 40 MG PO TABS
40.0000 mg | ORAL_TABLET | Freq: Every day | ORAL | Status: DC
  Administered 2017-09-09 – 2017-09-10 (×2): 40 mg via ORAL
  Filled 2017-09-09 (×2): qty 1

## 2017-09-09 MED ORDER — LORATADINE 10 MG PO TABS
10.0000 mg | ORAL_TABLET | Freq: Every day | ORAL | Status: DC
  Administered 2017-09-09 – 2017-09-10 (×2): 10 mg via ORAL
  Filled 2017-09-09 (×2): qty 1

## 2017-09-09 MED ORDER — ONDANSETRON HCL 4 MG/2ML IJ SOLN
4.0000 mg | Freq: Once | INTRAMUSCULAR | Status: AC
  Administered 2017-09-09: 4 mg via INTRAVENOUS
  Filled 2017-09-09: qty 2

## 2017-09-09 NOTE — H&P (Signed)
History and Physical  GASPAR FOWLE XBJ:[id number] DOB: 1929/04/05 DOA: 09/08/2017  Referring physician: DR Comer Locket PCP: [name], MD  Outpatient Specialists: Nephrology Patient coming from: Home & is not able to ambulate frequent falls  Chief Complaint: Vertigo gait abnormality falls  HPI: Justin Holloway is a 84 y.o. male with medical history significant for chronic kidney disease diabetes glaucoma hypertension history of TIA who presented to the emergency department with dizziness and gait problem that got worse yesterday.  But he has been experiencing this for about a week.  He has chronic vertigo he was actually seen on Thursday for vertigo treatment.  He said that he is never had it this bad before.  And he has been falling is not able to maintain his balance for any length of time it threatens to fall after 2 steps also.  He was prescribed meclizine in the past but he stated he did not help.Reports usually when he has these episodes he gets a room spinning sensation and will have some nausea but usually no vomiting.  States during these episodes he is usually able to walk normally, still assisted with his cane.Wife reports episode today was "different" as he had trouble walking which he usually doesn't have with vertigo. Patient does have hx of remote lacunar infarcts as well as TIA.    MRI was done and it was negative for an acute stroke.  He did show old lacunar infarcts with small vessel ischemic changes.  The sinus was thickened but no air-fluid level.  And he has a meningioma in the anterior cranial fossa   ED Course: Patient was given Valium in the emergency room with a bolus of fluid  Review of Systems: Dizziness and gait abnormality with falls.  Review of systems are otherwise negative   Past Medical History:  Diagnosis Date  . Arthritis   . CKD (chronic kidney disease), stage III ([place])   . Diabetes mellitus without complication ([place])   . Glaucoma    "blurred  vision", see Dr Gershon Crane yearly  . Hyperlipidemia   . Hypertension   . TIA (transient ischemic attack)    Past Surgical History:  Procedure Laterality Date  . INGUINAL HERNIA REPAIR Left 06/09/2013   Procedure: OPEN REPAIR LEFT INGUINAL HERNIA;  Surgeon: [name], MD;  Location: [facility];  Service: General;  Laterality: Left;  . INSERTION OF MESH Left 06/09/2013   Procedure: INSERTION OF MESH;  Surgeon: [name], MD;  Location: [facility];  Service: General;  Laterality: Left;    Social History:  reports that he has quit smoking. His smoking use included cigars. He has never used smokeless tobacco. He reports that he does not drink alcohol or use drugs.   No Known Allergies  Family History  Problem Relation Age of Onset  . Cancer Mother 40       breast  . COPD Father 13       Congestive heart failure     Prior to Admission medications   Medication Sig Start Date End Date Taking? Authorizing Provider  aspirin EC 81 MG tablet Take 81 mg by mouth every evening.   Yes [provider]  cholecalciferol (VITAMIN D) 1000 units tablet Take 1,000 Units by mouth 2 (two) times daily.   Yes [provider]  furosemide (LASIX) 20 MG tablet Take 40 mg by mouth daily.  12/29/15  Yes [provider]  hydrALAZINE (APRESOLINE) 25 MG tablet Take 25 mg by mouth 2 (  two) times daily.    Yes [provider]  Insulin Degludec (TRESIBA FLEXTOUCH) 200 UNIT/ML SOPN Inject 25 Units into the skin at bedtime.   Yes [provider]  meclizine (ANTIVERT) 12.5 MG tablet TK 1 T PO TID PRF DIZZINESS 02/07/17  Yes [provider]  Multiple Vitamin (MULTIVITAMIN WITH MINERALS) TABS tablet Take 1 tablet by mouth daily.   Yes [provider]  pravastatin (PRAVACHOL) 40 MG tablet Take 40 mg by mouth every evening.    Yes [provider]  Semaglutide (OZEMPIC) 0.25 or 0.5 MG/DOSE SOPN Inject 0.5 mg into the skin once a week.   Yes [provider]  diazepam (VALIUM) 5 MG tablet Take 0.5-1 tablets (2.5-5 mg total) by mouth every 8 (eight) hours as needed for up to 10 days (vertigo). 09/09/17 09/19/17  Fatima Blank, MD    Physical Exam: BP 136/78   Pulse 87   Temp 98.6 F (37 C) (Oral)   Resp 14   SpO2 99%   Physical Exam  Constitutional: He is oriented to person, place, and time. He appears well-developed and well-nourished.  HENT:  Head: Normocephalic and atraumatic.  Mouth/Throat: Oropharynx is clear and moist.  Eyes: Pupils are equal, round, and reactive to light. Conjunctivae and EOM are normal.  Neck: Normal range of motion.  Cardiovascular: Normal rate, regular rhythm and normal heart sounds.  Pulmonary/Chest: Effort normal and breath sounds normal. No stridor. No respiratory distress.  Abdominal: Soft. Bowel sounds are normal. There is no tenderness. There is no rebound and no guarding.  Musculoskeletal: Normal range of motion.  Lower extremity :has trace edema worse on the left side Neurological: He is alert and oriented to person, place, and time.  AAOx3, answering questions and following commands appropriately; equal strength UE and LE bilaterally; CN grossly intact; moves all extremities appropriately without ataxia; normal finger to nose bilaterally, no pronator drift; normal sensation to light/dull touch; good 2 point discrimination; normal speech that is clear and goal oriented  Skin: Skin is warm and dry.  Psychiatric: He has a normal mood and affect.  Nursing note and vitals reviewed.         Labs on Admission:  Basic Metabolic Panel: Recent Labs  Lab 09/08/17 2216  NA 135  K 3.7  CL 102  CO2 21*  GLUCOSE 159*  BUN 53*  CREATININE 2.90*  CALCIUM 8.8*   Liver Function Tests: Recent Labs  Lab 09/08/17 2216  AST 22  ALT 12  ALKPHOS 67  BILITOT 0.7  PROT 6.7  ALBUMIN 3.7   No results for input(s): LIPASE, AMYLASE in the last 168 hours. No results for input(s): AMMONIA in  the last 168 hours. CBC: Recent Labs  Lab 09/08/17 2216  WBC 6.3  NEUTROABS 3.2  HGB 11.3*  HCT 36.3*  MCV 92.8  PLT 195   Cardiac Enzymes: No results for input(s): CKTOTAL, CKMB, CKMBINDEX, TROPONINI in the last 168 hours.  BNP (last 3 results) No results for input(s): BNP in the last 8760 hours.  ProBNP (last 3 results) No results for input(s): PROBNP in the last 8760 hours.  CBG: Recent Labs  Lab 09/09/17 0126  GLUCAP 137*    Radiological Exams on Admission: Ct Head Wo Contrast  Result Date: 09/08/2017 CLINICAL DATA:  Patient with vertigo. EXAM: CT HEAD WITHOUT CONTRAST TECHNIQUE: Contiguous axial images were obtained from the base of the skull through the vertex without intravenous contrast. COMPARISON:  MRI brain 01/06/2017 FINDINGS: Brain:  Old left basal ganglia lacunar infarct. Ventricles and sulci are appropriate for patient's age. No evidence for acute cortically based infarct, intracranial hemorrhage, mass lesion or mass-effect. Vascular: Internal carotid arterial vascular calcifications. Skull: Intact. Sinuses/Orbits: Paranasal sinuses are unremarkable. Mastoid air cells are well aerated. Orbits are unremarkable. Other: None. IMPRESSION: No acute intracranial process. Electronically Signed   By: Lovey Newcomer M.D.   On: 09/08/2017 23:22   Mr Brain Wo Contrast  Result Date: 09/09/2017 CLINICAL DATA:  Tessie Fass of car fell on nose. Progressive ataxia. History of hypertension, hyperlipidemia. EXAM: MRI HEAD WITHOUT CONTRAST TECHNIQUE: Multiplanar, multiecho pulse sequences of the brain and surrounding structures were obtained without intravenous contrast. COMPARISON:  CT HEAD September 08, 2017 and MRI head January 06, 2017 FINDINGS: INTRACRANIAL CONTENTS: No reduced diffusion to suggest acute ischemia. No susceptibility artifact to suggest hemorrhage. No advanced parenchymal brain volume loss for age. No hydrocephalus. Old LEFT thalamus lacunar infarct. Patchy supratentorial white  matter FLAIR T2 hyperintensities. No suspicious parenchymal signal, masses, mass effect. No abnormal extra-axial fluid collections. 3 x 3 x 1.4 cm (transverse by AP by cc) low T1, low T2 meningioma along the planum sphenoidale extending into olfactory grooves. Mass effect on inferior rectus gyri without edema. VASCULAR: Normal major intracranial vascular flow voids present at skull base. SKULL AND UPPER CERVICAL SPINE: No abnormal sellar expansion. No suspicious calvarial bone marrow signal. Craniocervical junction maintained. SINUSES/ORBITS: Trace paranasal sinus mucosal thickening without air-fluid levels. Mastoid air cells are well aerated.The included ocular globes and orbital contents are non-suspicious. OTHER: None. IMPRESSION: 1. No acute intracranial process. 2. 3 x 3 x 1.4 cm anterior cranial fossa (planum sphenoidale/olfactory grooves) meningioma. Regional mass effect without parenchymal edema. 3. Mild chronic small vessel ischemic changes. Old LEFT thalamus lacunar infarct. Electronically Signed   By: Elon Alas M.D.   On: 09/09/2017 02:40    EKG: Independently reviewed. none   Assessment/Plan Present on Admission: **None**  Principal Problem:   Vertigo History of chronic vertigo with exacerbation his tympanic membranes dull with air-fluid level which may be contributing to the dizziness I will start him on cetirizine, will request OT and PT evaluation.  Neurology had been consulted by ED physician who advised admission and to order CT angiogram.  Also PT OT at home as well as vertigo treatment which she already getting he will continue that as outpatient Chronic kidney disease patient is following up with her outside nephrologist Hypertension controlled  DVT prophylaxis: Lovenox TED hose  Code Status: Full  Family Communication: Wife and daughter at bedside  Disposition Plan: Home  Consults called: Neurology  Admission status: Observation    Cristal Deer MD [facility] Pager [phone]  If 7PM-7AM, please contact night-coverage [email or web address] Password [facility]  09/09/2017, 12:53 PM

## 2017-09-09 NOTE — Care Management (Signed)
ED CM discussed patient with EDP, Patient is being re-evaluated patient for possible admission, patient unable to stand  and ambulate without falling.

## 2017-09-09 NOTE — ED Provider Notes (Signed)
[facility] EMERGENCY DEPARTMENT Provider Note   CSN: [id number] Arrival date & time: 09/08/17  2144     History   Chief Complaint Chief Complaint  Patient presents with  . Dizziness  . Gait Problem  . Blurred Vision    HPI Justin Holloway is a 84 y.o. male.  The history is provided by the patient and medical records.    84 y.o. M with hx of CKD, DM, glaucoma, HLP, HTN, hx of TIA, presenting to the ED for dizziness and gait problem.  Patient reports he has been having issues with dizziness and trouble walking for months now.  States he was told it was vertigo and went to have some testing for such recently.  States it seems to have helped a little, he has been less dizzy recently.  Reports usually when he has these episodes he gets a room spinning sensation and will have some nausea but usually no vomiting.  States during these episodes he is usually able to walk normally, still assisted with his cane.  Wife reports today at 20 PM patient started to appear "off".  Patient reports at that time he felt "woozy" but not dizzy.  States he had some blurred vision which resolved pretty quickly but had trouble getting up from chair.  States he felt like his feet were burning and were numb.  States he did eventually get up but had a lot of trouble walking, wife reports he was staggering which he never does.  States he was holding onto walls, doors, etc.  States generally he uses a cane to walk, but wife reports "he doesn't actually use it, he drags it behind him".  Currently, patient reports he feels fine.  He denies any current symptoms.  Wife is concerned because she reports this is much different than the "vertigo dizziness" he has been experiencing lately.  Past Medical History:  Diagnosis Date  . Arthritis   . CKD (chronic kidney disease), stage III ([place])   . Diabetes mellitus without complication ([place])   . Glaucoma    "blurred vision", see Dr Gershon Crane yearly  .  Hyperlipidemia   . Hypertension   . TIA (transient ischemic attack)     Patient Active Problem List   Diagnosis Date Noted  . UTI (lower urinary tract infection) 08/07/2015  . Dizziness 08/07/2015  . Acute renal failure superimposed on stage 3 chronic kidney disease ([place]) 08/07/2015  . Hyperkalemia 08/07/2015  . Left inguinal hernia 05/15/2013  . Diabetes mellitus without complication ([place]) 30/86/5784  . HLD (hyperlipidemia) 03/08/2006  . HYPERTENSION, BENIGN SYSTEMIC 03/08/2006  . 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 PROTEINURIA 03/08/2006    Past Surgical History:  Procedure Laterality Date  . INGUINAL HERNIA REPAIR Left 06/09/2013   Procedure: OPEN REPAIR LEFT INGUINAL HERNIA;  Surgeon: [name], MD;  Location: [facility];  Service: General;  Laterality: Left;  . INSERTION OF MESH Left 06/09/2013   Procedure: INSERTION OF MESH;  Surgeon: [name], MD;  Location: [facility];  Service: General;  Laterality: Left;        Home Medications    Prior to Admission medications   Medication Sig Start Date End Date Taking? Authorizing Provider  aspirin EC 81 MG tablet Take 81 mg by mouth every evening.    [provider]  cholecalciferol (VITAMIN D) 1000 units tablet Take 1,000 Units by mouth 2 (two) times daily.    [provider]  furosemide (LASIX) 20 MG tablet  12/29/15   [provider]  hydrALAZINE (APRESOLINE) 25 MG tablet Take 25 mg by mouth 3 (three) times daily.    [provider]  Insulin Degludec (TRESIBA FLEXTOUCH) 200 UNIT/ML SOPN Inject 24 Units into the skin at bedtime.    [provider]  meclizine (ANTIVERT) 12.5 MG tablet TK 1 T PO TID PRF DIZZINESS 02/07/17   [provider]  Multiple Vitamin (MULTIVITAMIN WITH MINERALS) TABS tablet Take 1 tablet by mouth daily.    [provider]  pravastatin (PRAVACHOL) 40 MG tablet Take 40 mg by mouth every evening.     [provider]    Family  History Family History  Problem Relation Age of Onset  . Cancer Mother 8       breast  . COPD Father 32       Congestive heart failure    Social History Social History   Tobacco Use  . Smoking status: Former Smoker    Types: Cigars  . Smokeless tobacco: Never Used  Substance Use Topics  . Alcohol use: No  . Drug use: No     Allergies   Patient has no known allergies.   Review of Systems Review of Systems  Neurological: Positive for dizziness.  All other systems reviewed and are negative.    Physical Exam Updated Vital Signs BP (!) 128/58   Pulse 77   Temp 98.6 F (37 C) (Oral)   Resp 16   SpO2 97%   Physical Exam  Constitutional: He is oriented to person, place, and time. He appears well-developed and well-nourished.  HENT:  Head: Normocephalic and atraumatic.  Mouth/Throat: Oropharynx is clear and moist.  Eyes: Pupils are equal, round, and reactive to light. Conjunctivae and EOM are normal.  Neck: Normal range of motion.  Cardiovascular: Normal rate, regular rhythm and normal heart sounds.  Pulmonary/Chest: Effort normal and breath sounds normal. No stridor. No respiratory distress.  Abdominal: Soft. Bowel sounds are normal. There is no tenderness. There is no rebound and no guarding.  Musculoskeletal: Normal range of motion.  Neurological: He is alert and oriented to person, place, and time.  AAOx3, answering questions and following commands appropriately; equal strength UE and LE bilaterally; CN grossly intact; moves all extremities appropriately without ataxia; normal finger to nose bilaterally, no pronator drift; normal sensation to light/dull touch; good 2 point discrimination; normal speech that is clear and goal oriented  Skin: Skin is warm and dry.  Psychiatric: He has a normal mood and affect.  Nursing note and vitals reviewed.    ED Treatments / Results  Labs (all labs ordered are listed, but only abnormal results are displayed) Labs  Reviewed  CBC - Abnormal; Notable for the following components:      Result Value   RBC 3.91 (*)    Hemoglobin 11.3 (*)    HCT 36.3 (*)    All other components within normal limits  COMPREHENSIVE METABOLIC PANEL - Abnormal; Notable for the following components:   CO2 21 (*)    Glucose, Bld 159 (*)    BUN 53 (*)    Creatinine, Ser 2.90 (*)    Calcium 8.8 (*)    GFR calc non Af Amer 18 (*)    GFR calc Af Amer 21 (*)    All other components within normal limits  CBG MONITORING, ED - Abnormal; Notable for the following components:   Glucose-Capillary 137 (*)    All other components within normal limits  PROTIME-INR  APTT  DIFFERENTIAL  I-STAT TROPONIN, ED    EKG None  Radiology Ct Head Wo Contrast  Result Date: 09/08/2017 CLINICAL DATA:  Patient with vertigo. EXAM: CT HEAD WITHOUT CONTRAST TECHNIQUE: Contiguous axial images were obtained from the base of the skull through the vertex without intravenous contrast. COMPARISON:  MRI brain 01/06/2017 FINDINGS: Brain: Old left basal ganglia lacunar infarct. Ventricles and sulci are appropriate for patient's age. No evidence for acute cortically based infarct, intracranial hemorrhage, mass lesion or mass-effect. Vascular: Internal carotid arterial vascular calcifications. Skull: Intact. Sinuses/Orbits: Paranasal sinuses are unremarkable. Mastoid air cells are well aerated. Orbits are unremarkable. Other: None. IMPRESSION: No acute intracranial process. Electronically Signed   By: Lovey Newcomer M.D.   On: 09/08/2017 23:22   Mr Brain Wo Contrast  Result Date: 09/09/2017 CLINICAL DATA:  Tessie Fass of car fell on nose. Progressive ataxia. History of hypertension, hyperlipidemia. EXAM: MRI HEAD WITHOUT CONTRAST TECHNIQUE: Multiplanar, multiecho pulse sequences of the brain and surrounding structures were obtained without intravenous contrast. COMPARISON:  CT HEAD September 08, 2017 and MRI head January 06, 2017 FINDINGS: INTRACRANIAL CONTENTS: No reduced  diffusion to suggest acute ischemia. No susceptibility artifact to suggest hemorrhage. No advanced parenchymal brain volume loss for age. No hydrocephalus. Old LEFT thalamus lacunar infarct. Patchy supratentorial white matter FLAIR T2 hyperintensities. No suspicious parenchymal signal, masses, mass effect. No abnormal extra-axial fluid collections. 3 x 3 x 1.4 cm (transverse by AP by cc) low T1, low T2 meningioma along the planum sphenoidale extending into olfactory grooves. Mass effect on inferior rectus gyri without edema. VASCULAR: Normal major intracranial vascular flow voids present at skull base. SKULL AND UPPER CERVICAL SPINE: No abnormal sellar expansion. No suspicious calvarial bone marrow signal. Craniocervical junction maintained. SINUSES/ORBITS: Trace paranasal sinus mucosal thickening without air-fluid levels. Mastoid air cells are well aerated.The included ocular globes and orbital contents are non-suspicious. OTHER: None. IMPRESSION: 1. No acute intracranial process. 2. 3 x 3 x 1.4 cm anterior cranial fossa (planum sphenoidale/olfactory grooves) meningioma. Regional mass effect without parenchymal edema. 3. Mild chronic small vessel ischemic changes. Old LEFT thalamus lacunar infarct. Electronically Signed   By: Elon Alas M.D.   On: 09/09/2017 02:40    Procedures Procedures (including critical care time)  Medications Ordered in ED Medications  ondansetron (ZOFRAN) injection 4 mg (has no administration in time range)  sodium chloride 0.9 % bolus 500 mL (has no administration in time range)  meclizine (ANTIVERT) tablet 25 mg (has no administration in time range)  diazepam (VALIUM) tablet 2 mg (2 mg Oral Given 09/09/17 0405)     Initial Impression / Assessment and Plan / ED Course  I have reviewed the triage vital signs and the nursing notes.  Pertinent labs & imaging results that were available during my care of the patient were reviewed by me and considered in my medical  decision making (see chart for details).  84 y.o. M here with dizziness.  Has been battling with vertigo for about 8 months now, saw neurology recently for same.  No relief with meclizine at home.  Wife reports episode today was "different" as he had trouble walking which he usually doesn't have with vertigo.  He is afebrile, non-toxic.  AAOx3, no focal deficits noted on my exam.  Labs and head CT from triage overall reassuring.  Patient does have hx of remote lacunar infarcts as well as TIA.  Will obtain MRI to assess for acute stroke.  MRI negative.  Patient treated here with valium.  Still unable to get up and ambulate with 2 separate trials, one by myself.  Wife is somewhat upset that we have not "fixed his dizziness and keep trying to get him up".  I have explained to her that there is not a "cure" for vertigo, and some people do not respond to medications right away so we are trying to assess his progress.  She is somewhat upset with [place] that we gave him valium, stated we were "just trying to make him go to sleep".  I assured here this was not the case, the last thing we want is for him to be drowsy/sleepy and fall.  I attempted to discuss with her that we use valium for multiple different reasons including vertigo to which she replied "I'm sure you do".  I discussed possibility of home health/PT/OT to see if this could possibly help with his mobility as on chart review of his recent neurologic notes, he has gait disturbance/balance issues dating back as far as 4 years.  Wife seemed somewhat offended that I asked about that and abruptly stated "that ain't going to do nothing". At that point, I excused myself from the room as family's behavior seemed to be escalating.  Dr. Leonette Monarch was able to get patient up and bedside, felt he may have some orthostatic changes.  Apparently, patient's nephrologist did recently increase his lasix.  Orthostatics are normal here.  Dr. Leonette Monarch has spoken with family further,  will give small bolus of IVF, additional meclizine and home health eval will be ordered for him.  After IVF, patient to be discharged to follow-up with his PCP and neurologist.  Final Clinical Impressions(s) / ED Diagnoses   Final diagnoses:  Dizziness    ED Discharge Orders    None       Larene Pickett, PA-C 09/09/17 0706    Fatima Blank, MD 09/11/17 [phone]

## 2017-09-09 NOTE — ED Notes (Signed)
Pt attempted to sit up and ambulate. Pt began to feel dizzy upon sitting up from the bed and could not ambulate.

## 2017-09-09 NOTE — Discharge Instructions (Addendum)
Can use OTC debrox for ear wax build up

## 2017-09-09 NOTE — Progress Notes (Signed)
CSW now aware that pt may meet criteria for SNF placement. Pt being monitored at this time. CSW to speak with pt and family regarding possibility for SNF placement.   Virgie Dad Jazmin Ley, MSW, [facility] Clinical Social Worker [phone]

## 2017-09-09 NOTE — Progress Notes (Addendum)
CSW informed that pt is in need of home health services. CSW spoke with pt and family at bedside to offer options. Pt expressed "it doesn't matter, will I have to pay for this?". CSW advised pt that insurance should cover the cost of services.   CSW has updated RNCM of this need at this time. RNCM working on getting services established for pt. Virgie Dad Jassiah Viviano, MSW, [facility] Clinical Social Worker [phone]

## 2017-09-09 NOTE — ED Notes (Signed)
Previous entry 1115 handoff charted on wrong patient.

## 2017-09-09 NOTE — ED Notes (Signed)
Pt given something to eat and drink.

## 2017-09-10 DIAGNOSIS — E119 Type 2 diabetes mellitus without complications: Secondary | ICD-10-CM

## 2017-09-10 DIAGNOSIS — R42 Dizziness and giddiness: Secondary | ICD-10-CM | POA: Diagnosis not present

## 2017-09-10 LAB — BASIC METABOLIC PANEL
Anion gap: 7 (ref 5–15)
BUN: 42 mg/dL — ABNORMAL HIGH (ref 8–23)
CALCIUM: 8.7 mg/dL — AB (ref 8.9–10.3)
CHLORIDE: 107 mmol/L (ref 98–111)
CO2: 27 mmol/L (ref 22–32)
CREATININE: 2.73 mg/dL — AB (ref 0.61–1.24)
GFR calc non Af Amer: 20 mL/min — ABNORMAL LOW (ref >60)
GFR, EST AFRICAN AMERICAN: 23 mL/min — AB (ref >60)
Glucose, Bld: 136 mg/dL — ABNORMAL HIGH (ref 70–99)
Potassium: 3.8 mmol/L (ref 3.5–5.1)
SODIUM: 141 mmol/L (ref 135–145)

## 2017-09-10 MED ORDER — DIAZEPAM 5 MG PO TABS: 2.5000 mg | ORAL_TABLET | Freq: Three times a day (TID) | ORAL | 0 refills | Status: AC | PRN

## 2017-09-10 MED ORDER — LORATADINE 10 MG PO TABS: 10.0000 mg | ORAL_TABLET | Freq: Every day | ORAL | Status: DC

## 2017-09-10 NOTE — Discharge Summary (Addendum)
Physician Discharge Summary  ULICE FOLLETT ZYS:[id number] DOB: 12-09-1929 DOA: 09/08/2017  PCP: [name], MD  Admit date: 09/08/2017 Discharge date: 09/10/2017  Admitted From: home Discharge disposition: stable for d/c home   Recommendations for Outpatient Follow-Up:   -F/u nephrologist, Dr. March Rummage in next 1-2 weeks (? With recent weight loss can BP medications be decreased) -f/u neurology 3-4 weeks to determine if vestibular PT effective Home health vestibular PT   Discharge Diagnosis:   Principal Problem:   Vertigo   Discharge Condition: Improved.  Diet recommendation: Low sodium, heart healthy/carb mod  Wound care: None.  Code status: Full.   History of Present Illness:   84 yo with pmh CKD, IDDM, hypertension, hyperlipidemia, TIA, glaucoma presented to ED on 8/31 with c/o dizziness and unsteady gait. Pt seen by neurology in February with similar symptoms that were felt due to diabetic polyneuropathy at that time. He was evaluated by ENT on 09/06/17, Sallee Provencal, PA-C, who felt symptoms were due to BPPV. Meclizine stopped at that visity. Symptoms became worse with very unsteady gait on day of admit prompting ED visit.   ED workup included MRI showing stable anterior cranial fossa meningioma and old lacunar infarcts. Creatinine 2.9 (baseline appears to be ~2.4-2.7), cbc and chemistries o/w unremarkable. Pt was given Valium and fluid bolus in ED. Upon evaluation today, pt is feeling better. He has been up with PT, who recommends vestibular rehab and walker.    Hospital Course by Problem:   Vertigo -chronic issue for pt. Acute worsening of symptoms could possibly be due to recent increase in Lasix dose and subsequent 20lb weight loss over the last few weeks. His creatinine was mildly bumped on admit, but appears euvolemic on exam today. MRI brain negative for acute process. He is feeling at baseline today.  -I spoke with neurohospitalists, Dr. Leonel Ramsay, who  feels further imaging would be of no benefit for this pt. Contrast is also not an option given renal fx -pt would like to try prn Valium as this seemed to help in ED yesterday (discussed no driving and not mixing with other sedating agents) -HH for vestibular PT -follow-up neurology 3-4 weeks to determine effectiveness.  -Pt to call nephrology tomorrow to move up f/u appointment and determine if Lasix dose can decreased.   Hypertension -good control -continue hydralazine  CKD, stage III -at baseline -manage per renal  IDDM -good control while in hospital -A1C 10.6% 02/2017 -continue Joni Reining   Medical Consultants:   No formal consult. Spoke with neurohospitalists on phone.    Discharge Exam:   Vitals:   09/10/17 0900 09/10/17 1400  BP: 118/64 107/62  Pulse: 79 86  Resp: 17   Temp: 97.7 F (36.5 C) 97.9 F (36.6 C)  SpO2: 97%    Vitals:   09/10/17 0433 09/10/17 0844 09/10/17 0900 09/10/17 1400  BP: (!) 107/57 111/60 118/64 107/62  Pulse: 80  79 86  Resp: 18  17   Temp: 98 F (36.7 C)  97.7 F (36.5 C) 97.9 F (36.6 C)  TempSrc: Oral  Oral Oral  SpO2: 99%  97%   Weight:      Height:        General exam: Appears calm and comfortable. Sitting in chair in NAD Respiratory system: Clear to auscultation. Respiratory effort normal. Cardiovascular system: S1 & S2 heard, RRR. No JVD,  rubs, gallops or clicks. No murmurs. Gastrointestinal system: Abdomen is nondistended, soft and nontender. No organomegaly or masses felt.  Normal bowel sounds heard. Central nervous system: Alert and oriented. No focal neurological deficits. Extremities: No clubbing,  or cyanosis. No edema. Skin: No rashes, lesions or ulcers. Psychiatry: Judgement and insight appear normal. Mood & affect appropriate.    The results of significant diagnostics from this hospitalization (including imaging, microbiology, ancillary and laboratory) are listed below for reference.     Procedures and  Diagnostic Studies:   Ct Head Wo Contrast  Addendum Date: 09/09/2017   ADDENDUM REPORT: 09/09/2017 15:30 ADDENDUM: Addendum for addition of findings and impression: 1. There is a poorly visualized mass along the planum sphenoidale which exerts mass effect on the inferior rectus gyri. This is better visualized on subsequent MRI (09/09/2017) and concerning for meningioma. 2. Old left thalamic lacunar infarct. Electronically Signed   By: Lovey Newcomer M.D.   On: 09/09/2017 15:30   Result Date: 09/09/2017 CLINICAL DATA:  Patient with vertigo. EXAM: CT HEAD WITHOUT CONTRAST TECHNIQUE: Contiguous axial images were obtained from the base of the skull through the vertex without intravenous contrast. COMPARISON:  MRI brain 01/06/2017 FINDINGS: Brain: Old left basal ganglia lacunar infarct. Ventricles and sulci are appropriate for patient's age. No evidence for acute cortically based infarct, intracranial hemorrhage, mass lesion or mass-effect. Vascular: Internal carotid arterial vascular calcifications. Skull: Intact. Sinuses/Orbits: Paranasal sinuses are unremarkable. Mastoid air cells are well aerated. Orbits are unremarkable. Other: None. IMPRESSION: No acute intracranial process. Electronically Signed: By: Lovey Newcomer M.D. On: 09/08/2017 23:22   Mr Brain Wo Contrast  Result Date: 09/09/2017 CLINICAL DATA:  Tessie Fass of car fell on nose. Progressive ataxia. History of hypertension, hyperlipidemia. EXAM: MRI HEAD WITHOUT CONTRAST TECHNIQUE: Multiplanar, multiecho pulse sequences of the brain and surrounding structures were obtained without intravenous contrast. COMPARISON:  CT HEAD September 08, 2017 and MRI head January 06, 2017 FINDINGS: INTRACRANIAL CONTENTS: No reduced diffusion to suggest acute ischemia. No susceptibility artifact to suggest hemorrhage. No advanced parenchymal brain volume loss for age. No hydrocephalus. Old LEFT thalamus lacunar infarct. Patchy supratentorial white matter FLAIR T2 hyperintensities. No  suspicious parenchymal signal, masses, mass effect. No abnormal extra-axial fluid collections. 3 x 3 x 1.4 cm (transverse by AP by cc) low T1, low T2 meningioma along the planum sphenoidale extending into olfactory grooves. Mass effect on inferior rectus gyri without edema. VASCULAR: Normal major intracranial vascular flow voids present at skull base. SKULL AND UPPER CERVICAL SPINE: No abnormal sellar expansion. No suspicious calvarial bone marrow signal. Craniocervical junction maintained. SINUSES/ORBITS: Trace paranasal sinus mucosal thickening without air-fluid levels. Mastoid air cells are well aerated.The included ocular globes and orbital contents are non-suspicious. OTHER: None. IMPRESSION: 1. No acute intracranial process. 2. 3 x 3 x 1.4 cm anterior cranial fossa (planum sphenoidale/olfactory grooves) meningioma. Regional mass effect without parenchymal edema. 3. Mild chronic small vessel ischemic changes. Old LEFT thalamus lacunar infarct. Electronically Signed   By: Elon Alas M.D.   On: 09/09/2017 02:40     Labs:   Basic Metabolic Panel: Recent Labs  Lab 09/08/17 2216 09/10/17 0520  NA 135 141  K 3.7 3.8  CL 102 107  CO2 21* 27  GLUCOSE 159* 136*  BUN 53* 42*  CREATININE 2.90* 2.73*  CALCIUM 8.8* 8.7*   GFR Estimated Creatinine Clearance: 21.9 mL/min (A) (by C-G formula based on SCr of 2.73 mg/dL (H)). Liver Function Tests: Recent Labs  Lab 09/08/17 2216  AST 22  ALT 12  ALKPHOS 67  BILITOT 0.7  PROT 6.7  ALBUMIN 3.7   No results  for input(s): LIPASE, AMYLASE in the last 168 hours. No results for input(s): AMMONIA in the last 168 hours. Coagulation profile Recent Labs  Lab 09/08/17 2216  INR 1.00    CBC: Recent Labs  Lab 09/08/17 2216  WBC 6.3  NEUTROABS 3.2  HGB 11.3*  HCT 36.3*  MCV 92.8  PLT 195   Cardiac Enzymes: No results for input(s): CKTOTAL, CKMB, CKMBINDEX, TROPONINI in the last 168 hours. BNP: Invalid input(s): POCBNP CBG: Recent  Labs  Lab 09/09/17 0126 09/09/17 2132  GLUCAP 137* 173*   D-Dimer No results for input(s): DDIMER in the last 72 hours. Hgb A1c No results for input(s): HGBA1C in the last 72 hours. Lipid Profile No results for input(s): CHOL, HDL, LDLCALC, TRIG, CHOLHDL, LDLDIRECT in the last 72 hours. Thyroid function studies No results for input(s): TSH, T4TOTAL, T3FREE, THYROIDAB in the last 72 hours.  Invalid input(s): FREET3 Anemia work up No results for input(s): VITAMINB12, FOLATE, FERRITIN, TIBC, IRON, RETICCTPCT in the last 72 hours. Microbiology No results found for this or any previous visit (from the past 240 hour(s)).   Discharge Instructions:   Discharge Instructions    Diet - low sodium heart healthy   Complete by:  As directed    Diet Carb Modified   Complete by:  As directed    Increase activity slowly   Complete by:  As directed      Allergies as of 09/10/2017   No Known Allergies     Medication List    STOP taking these medications   meclizine 12.5 MG tablet Commonly known as:  ANTIVERT     TAKE these medications   aspirin EC 81 MG tablet Take 81 mg by mouth every evening.   cholecalciferol 1000 units tablet Commonly known as:  VITAMIN D Take 1,000 Units by mouth 2 (two) times daily.   diazepam 5 MG tablet Commonly known as:  VALIUM Take 0.5-1 tablets (2.5-5 mg total) by mouth every 8 (eight) hours as needed for up to 10 days (vertigo).   furosemide 20 MG tablet Commonly known as:  LASIX Take 40 mg by mouth daily.   hydrALAZINE 25 MG tablet Commonly known as:  APRESOLINE Take 25 mg by mouth 2 (two) times daily.   loratadine 10 MG tablet Commonly known as:  CLARITIN Take 1 tablet (10 mg total) by mouth daily. Start taking on:  09/11/2017   multivitamin with minerals Tabs tablet Take 1 tablet by mouth daily.   OZEMPIC 0.25 or 0.5 MG/DOSE Sopn Generic drug:  Semaglutide Inject 0.5 mg into the skin once a week.   pravastatin 40 MG  tablet Commonly known as:  PRAVACHOL Take 40 mg by mouth every evening.   TRESIBA FLEXTOUCH 200 UNIT/ML Sopn Generic drug:  Insulin Degludec Inject 25 Units into the skin at bedtime.      Follow-up Information    Schedule an appointment as soon as possible for a visit  with [name], MD.   Specialty:  Internal Medicine Contact information: [address] [place] [address] [place], [facility] Follow up.   Specialty:  Home Health Services Why:  Vestibular Physical Therapy; Occupational Therapy Contact information: [address] [place] [address] [phone]            Time coordinating discharge: 30 min   Signed:  [place]

## 2017-09-10 NOTE — Clinical Social Work Note (Signed)
Clinical Social Work Assessment  Patient Details  Name: Justin Holloway MRN: [id number] Date of Birth: 10-05-1929  Date of referral:  09/10/17               Reason for consult:  Facility Placement, Discharge Planning                Permission sought to share information with:  Family Supports Permission granted to share information::  Yes, Verbal Permission Granted  Name::     Children   Agency::  family  Relationship::  children   Contact Information:  Children   Housing/Transportation Living arrangements for the past 2 months:  Single Family Home(with wife. ) Source of Information:  Patient Patient Interpreter Needed:  None Criminal Activity/Legal Involvement Pertinent to Current Situation/Hospitalization:  No - Comment as needed Significant Relationships:  Adult Children, Spouse, Other Family Members Lives with:  Spouse Do you feel safe going back to the place where you live?  Yes Need for family participation in patient care:  Yes (Comment)  Care giving concerns: CSW verbally consulted for possible SNF placement.    Social Worker assessment / plan:  CSW spoke with pt at bedside. CSW was informed that pt is from home with wife and has support from children and grandchildren. CSW sought further information from pt regarding home situation. Pt expresses that pt feels safe at home and wishes to return back home with home health services. Pt feels that pt would be better off at home and declines further SNF needs.   Employment status:  Retired Nurse, adult PT Recommendations:  Not assessed at this time [place] / Referral to community resources:  [place], Other (Comment Required)(home health)  Patient/Family's Response to care:  Pt appeared to be understanding and agreeable to plan of care at this time. Pt understands that rehab would aid in pt getting stronger however pt would rather have someone come into the home and work with pt  there.   Patient/Family's Understanding of and Emotional Response to Diagnosis, Current Treatment, and Prognosis:  No further questions or concerns have been presented to CSW at this time. Understanding of care appeared to be agreeable to home health services as pt expressed that this is what was discussed with pt on yesterday.  Emotional Assessment Appearance:  Appears stated age Attitude/Demeanor/Rapport:  Engaged, Gracious, Self-Confident Affect (typically observed):  Accepting, Pleasant, Appropriate Orientation:  Oriented to Self, Oriented to Situation, Oriented to Place, Oriented to  Time Alcohol / Substance use:  Not Applicable Psych involvement (Current and /or in the community):  No (Comment)  Discharge Needs  Concerns to be addressed:  Denies Needs/Concerns at this time Readmission within the last 30 days:  No Current discharge risk:  None Barriers to Discharge:  No Barriers Identified   Wetzel Bjornstad, [place] 09/10/2017, 9:02 AM

## 2017-09-10 NOTE — Evaluation (Signed)
Physical Therapy Evaluation Patient Details Name: Justin Holloway MRN: [id number] DOB: Jun 15, 1929 Today's Date: 09/10/2017   History of Present Illness  Pt is an 84 year old man admitted with dizziness, blurred vision and difficulty walking. He reports he was treated for BPPV on Thursday at the ENT's office, but did not have any relief. PMH: CKD, DM, HTN, glaucoma, TIAs.   Clinical Impression  Pt admitted with above diagnosis. Pt currently with functional limitations due to the deficits listed below (see PT Problem List). At the time of PT eval, pt presenting with symptoms consistent with R horizontal canal BPPV. Pt reports improvement in symptoms after BBQ roll treatment technique x2 however no complete resolution of symptoms. Recommend pt utilize RW for support at home vs [facility]. Will have family support 24/7, however wife with some physical limitations in providing assistance at home. Recommend HHPT follow up for continued BPPV treatment. Pt will benefit from skilled PT to increase their independence and safety with mobility to allow discharge to the venue listed below.       Follow Up Recommendations Home health PT(Vestibular)    Equipment Recommendations  None recommended by PT    Recommendations for Other Services       Precautions / Restrictions Precautions Precautions: Fall Restrictions Weight Bearing Restrictions: No      Mobility  Bed Mobility Overal bed mobility: Modified Independent             General bed mobility comments: pt without dizziness rolling to L side of bed and with sidelying to sit, reports he does have dizziness when rolling to the R  Transfers Overall transfer level: Needs assistance Equipment used: 1 person hand held assist Transfers: Sit to/from Stand Sit to Stand: Min assist         General transfer comment: steadying assist, pt denies dizziness with standing  Ambulation/Gait Ambulation/Gait assistance: Min assist Gait Distance (Feet):  100 Feet Assistive device: 2 person hand held assist;1 person hand held assist Gait Pattern/deviations: Step-through pattern;Decreased stride length;Trunk flexed Gait velocity: Decreased Gait velocity interpretation: 1.31 - 2.62 ft/sec, indicative of limited community ambulator General Gait Details: 2 person HHA progressing to single UE support by end of gait training. Unsteady but no large losses of balance. No dizziness or increased unsteadiness with the addition of horizontal head turns.   Stairs            Wheelchair Mobility    Modified Rankin (Stroke Patients Only)       Balance Overall balance assessment: Needs assistance Sitting-balance support: Feet supported;No upper extremity supported Sitting balance-Leahy Scale: Good     Standing balance support: Single extremity supported;During functional activity Standing balance-Leahy Scale: Poor      Vestibular Assessment - 09/10/17 0001      Vestibular Assessment   General Observation  Appears generally comfortable in bed unless rolling to the R. Pt reports 8-9 months of vertigo but current exacerbation is "worse than it's ever been" and reportedly came on 2 days ago. Denies hearing loss, ringing in ears, visual deficits, recent head trauma, sinus/upper respiratory symptoms.      Symptom Behavior   Type of Dizziness  Spinning    Frequency of Dizziness  When rolling to the R    Duration of Dizziness  ~30 seconds    Aggravating Factors  Rolling to right    Relieving Factors  Head stationary      Occulomotor Exam   Occulomotor Alignment  Normal      Positional  Testing   Dix-Hallpike  Dix-Hallpike Right    Horizontal Canal Testing  Horizontal Canal Right;Horizontal Canal Left;Horizontal Canal Right Intensity      Dix-Hallpike Right   Dix-Hallpike Right Symptoms  No nystagmus   Asymptomatic     Horizontal Canal Right   Horizontal Canal Right Duration  ~30 seconds    Horizontal Canal Right Symptoms   Nystagmus;Geotrophic      Horizontal Canal Left   Horizontal Canal Left Symptoms  Normal      Horizontal Canal Right Intensity   Horizontal Canal Right Intensity  Moderate    Right Intensity Comment  Appeared to improve with BBQ roll treatment      Cognition   Cognition Orientation Level  Oriented x 4      Positional Sensitivities   Sit to Supine  Lightheadedness    Rolling Right  Moderate dizziness    Rolling Left  No dizziness                                 Pertinent Vitals/Pain Pain Assessment: No/denies pain    Home Living Family/patient expects to be discharged to:: Private residence Living Arrangements: Spouse/significant other Available Help at Discharge: Family;Available 24 hours/day Type of Home: House Home Access: Level entry     Home Layout: One level Home Equipment: Cane - single point      Prior Function Level of Independence: Independent with assistive device(s)         Comments: pt ambulates with a cane     Hand Dominance   Dominant Hand: Right    Extremity/Trunk Assessment   Upper Extremity Assessment Upper Extremity Assessment: Overall WFL for tasks assessed    Lower Extremity Assessment Lower Extremity Assessment: Generalized weakness    Cervical / Trunk Assessment Cervical / Trunk Assessment: Other exceptions Cervical / Trunk Exceptions: Forward head with rounded shoulders  Communication   Communication: No difficulties  Cognition Arousal/Alertness: Awake/alert Behavior During Therapy: WFL for tasks assessed/performed Overall Cognitive Status: Within Functional Limits for tasks assessed                                        General Comments      Exercises     Assessment/Plan    PT Assessment Patient needs continued PT services  PT Problem List Decreased strength;Decreased activity tolerance;Decreased balance;Decreased mobility;Decreased knowledge of use of DME;Decreased safety  awareness;Decreased knowledge of precautions       PT Treatment Interventions DME instruction;Gait training;Stair training;Functional mobility training;Therapeutic activities;Therapeutic exercise;Neuromuscular re-education;Patient/family education;Balance training    PT Goals (Current goals can be found in the Care Plan section)  Acute Rehab PT Goals Patient Stated Goal: to resolve dizziness PT Goal Formulation: With patient/family Time For Goal Achievement: 09/17/17 Potential to Achieve Goals: Good    Frequency Min 3X/week   Barriers to discharge        Co-evaluation               AM-PAC PT "6 Clicks" Daily Activity  Outcome Measure Difficulty turning over in bed (including adjusting bedclothes, sheets and blankets)?: None Difficulty moving from lying on back to sitting on the side of the bed? : None Difficulty sitting down on and standing up from a chair with arms (e.g., wheelchair, bedside commode, etc,.)?: A Little Help needed moving to and from a bed  to chair (including a wheelchair)?: A Little Help needed walking in hospital room?: A Little Help needed climbing 3-5 steps with a railing? : A Little 6 Click Score: 20    End of Session Equipment Utilized During Treatment: Gait belt Activity Tolerance: Patient tolerated treatment well Patient left: in chair;with call bell/phone within reach;with family/visitor present Nurse Communication: Mobility status PT Visit Diagnosis: BPPV BPPV - Right/Left : Right    Time: 2778-2423 PT Time Calculation (min) (ACUTE ONLY): 48 min   Charges:   PT Evaluation $PT Eval Moderate Complexity: 1 Mod PT Treatments $Therapeutic Activity: 8-22 mins $Canalith Rep Proc: 8-22 mins        Rolinda Roan, PT, DPT Acute Rehabilitation Services Pager: [phone] Office: [phone]   Thelma Comp 09/10/2017, 1:16 PM

## 2017-09-10 NOTE — Progress Notes (Signed)
CSW spoke with pt at bedside to get ideas on SNF placement. Pt reports that pt would rather be at home and get home health services as opposed to going to a facility for rehab. CSW understanding of this and has updated RNCM of pt's wishes at this time. Considering pt no longer interested in SNF placement CSW will sign off as there are no further CSW needs warranted at this time. IF new need arises please reconsult.   Virgie Dad Idania Desouza, MSW, [facility] Clinical Social Worker [phone]

## 2017-09-10 NOTE — Care Management Note (Signed)
Case Management Note  Patient Details  Name: LENFORD BEDDOW MRN: [id number] Date of Birth: 1929/05/28  Subjective/Objective: 84 year old man presented with dizziness, blurred vision and difficulty walking.                  Action/Plan: CM met with patient/spouse to discuss post hospital transitional needs. Patient lives at home with spouse, ambulates with a cane, with assistance with ADLs being provided by spouse. CM discuss recommendations for HHPT/OT, with patient/spouse agreeable. HH choice list provided, with [facility] selected. Spouse will provide transportation home. [place] referral given to Linna [facility] [employer] liaison, with AVS updated. No further needs from CM at this time.   Expected Discharge Date:  09/10/17               Expected Discharge Plan:  Home/Self Care  In-House Referral:  NA  Discharge planning Services  CM Consult  Post Acute Care Choice:  Home Health Choice offered to:  Patient, Spouse  DME Arranged:  N/A DME Agency:  NA  HH Arranged:  PT, OT HH Agency:  [facility]  Status of Service:  Completed, signed off  If discussed at [place] of Stay Meetings, dates discussed:    Additional Comments:  Midge Minium RN, BSN, NCM-BC, ACM-RN [phone] 09/10/2017, 11:56 AM

## 2017-09-10 NOTE — Evaluation (Signed)
Occupational Therapy Evaluation Patient Details Name: Justin Holloway MRN: [id number] DOB: 1929/05/06 Today's Date: 09/10/2017    History of Present Illness Pt is an 84 year old man admitted with dizziness, blurred vision and difficulty walking. He reports he was treated for BPPV on Thursday at the ENT's office, but did not have any relief. PMH: CKD, DM, HTN, glaucoma, TIAs.    Clinical Impression   Pt typically walks with a cane and is independent in ADL and IADL. He denies dizziness this visit, but reports he does experience it with rolling to the R. Pt is unsteady with one LOB requiring assist to regain his balance. Pt needs min assist for ADL. Will follow acutely, recommending HHOT for safety/DME assessment when he goes home as he reports poor balance for months leading up to admission.     Follow Up Recommendations  Home health OT    Equipment Recommendations  (defer to [facility])    Recommendations for Other Services       Precautions / Restrictions Precautions Precautions: Fall      Mobility Bed Mobility Overal bed mobility: Modified Independent             General bed mobility comments: pt without dizziness rolling to L side of bed and with sidelying to sit, reports he does have dizziness when rolling to the L  Transfers Overall transfer level: Needs assistance Equipment used: Straight cane Transfers: Sit to/from Stand Sit to Stand: Min assist         General transfer comment: steadying assist, pt denies dizziness with standing    Balance Overall balance assessment: Needs assistance   Sitting balance-Leahy Scale: Good       Standing balance-Leahy Scale: Poor                             ADL either performed or assessed with clinical judgement   ADL Overall ADL's : Needs assistance/impaired Eating/Feeding: Independent;Sitting   Grooming: Minimal assistance;Standing;Wash/dry hands   Upper Body Bathing: Set up;Supervision/ safety;Sitting    Lower Body Bathing: Minimal assistance;Sit to/from stand   Upper Body Dressing : Set up;Sitting   Lower Body Dressing: Minimal assistance;Sit to/from stand   Toilet Transfer: Minimal assistance;Ambulation;Regular Toilet(with cane)   Toileting- Clothing Manipulation and Hygiene: Minimal assistance;Sit to/from stand       Functional mobility during ADLs: Minimal assistance;Cane General ADL Comments: pt with one LOB with ambulation in room     Vision Baseline Vision/History: Glaucoma Patient Visual Report: No change from baseline       Perception     Praxis      Pertinent Vitals/Pain Pain Assessment: No/denies pain     Hand Dominance Right   Extremity/Trunk Assessment Upper Extremity Assessment Upper Extremity Assessment: Overall WFL for tasks assessed   Lower Extremity Assessment Lower Extremity Assessment: Defer to PT evaluation       Communication Communication Communication: No difficulties   Cognition Arousal/Alertness: Awake/alert Behavior During Therapy: WFL for tasks assessed/performed Overall Cognitive Status: Within Functional Limits for tasks assessed                                     General Comments       Exercises     Shoulder Instructions      Home Living Family/patient expects to be discharged to:: Private residence Living Arrangements: Spouse/significant other Available Help  at Discharge: Family;Available 24 hours/day Type of Home: House Home Access: Level entry     Home Layout: One level               Home Equipment: Cane - single point          Prior Functioning/Environment Level of Independence: Independent with assistive device(s)        Comments: pt ambulates with a cane        OT Problem List: Impaired balance (sitting and/or standing)      OT Treatment/Interventions: Self-care/ADL training;DME and/or AE instruction;Patient/family education;Balance training    OT Goals(Current goals can  be found in the care plan section) Acute Rehab OT Goals Patient Stated Goal: to resolve dizziness OT Goal Formulation: With patient Time For Goal Achievement: 09/24/17 Potential to Achieve Goals: Good ADL Goals Pt Will Perform Grooming: (P) with modified independence;standing Pt Will Perform Lower Body Bathing: (P) with modified independence;sit to/from stand Pt Will Perform Lower Body Dressing: (P) with modified independence;sit to/from stand Pt Will Transfer to Toilet: (P) with modified independence;ambulating Pt Will Perform Toileting - Clothing Manipulation and hygiene: (P) with modified independence;sit to/from stand  OT Frequency: Min 2X/week   Barriers to D/C:            Co-evaluation              AM-PAC PT "6 Clicks" Daily Activity     Outcome Measure Help from another person eating meals?: None Help from another person taking care of personal grooming?: A Little Help from another person toileting, which includes using toliet, bedpan, or urinal?: A Little Help from another person bathing (including washing, rinsing, drying)?: A Little Help from another person to put on and taking off regular upper body clothing?: None Help from another person to put on and taking off regular lower body clothing?: A Little 6 Click Score: 20   End of Session Equipment Utilized During Treatment: Gait belt Nurse Communication: Mobility status;Other (comment)(pt in dirty gown and bed)  Activity Tolerance: Patient tolerated treatment well Patient left: with family/visitor present;Other (comment)(on 3 in 1 in bathroom)  OT Visit Diagnosis: Unsteadiness on feet (R26.81);Other abnormalities of gait and mobility (R26.89)                Time: 8466-5993 OT Time Calculation (min): 20 min Charges:  OT General Charges $OT Visit: 1 Visit OT Evaluation $OT Eval Moderate Complexity: 1 Mod  09/10/2017 Nestor Lewandowsky, OTR/L Pager: [phone]  Werner Lean, [place] 09/10/2017, 9:53 AM

## 2017-09-10 NOTE — Progress Notes (Signed)
PT Cancellation Note  Patient Details Name: Justin Holloway MRN: [id number] DOB: Jun 01, 1929   Cancelled Treatment:    Reason Eval/Treat Not Completed: Pt currently on strict bedrest. Discussed with RN who states she will reach out to MD. Will await increased activity orders prior to initiating PT eval.    Thelma Comp 09/10/2017, 9:35 AM   Rolinda Roan, PT, DPT Acute Rehabilitation Services Pager: [phone] Office: [phone]

## 2017-09-10 NOTE — Progress Notes (Signed)
CSW spoke with patient regarding possible SNF placement. Patient states he does not want to go to a skilled nursing facility and would rather have home health work with him at home. Physical therapy to evaluate patient today. If patient changes mind or has questions, call Fanny Skates, at [phone].

## 2017-09-17 DIAGNOSIS — N08 Glomerular disorders in diseases classified elsewhere: Secondary | ICD-10-CM | POA: Diagnosis not present

## 2017-09-17 DIAGNOSIS — E1122 Type 2 diabetes mellitus with diabetic chronic kidney disease: Secondary | ICD-10-CM | POA: Diagnosis not present

## 2017-09-17 DIAGNOSIS — N184 Chronic kidney disease, stage 4 (severe): Secondary | ICD-10-CM

## 2017-09-17 DIAGNOSIS — I129 Hypertensive chronic kidney disease with stage 1 through stage 4 chronic kidney disease, or unspecified chronic kidney disease: Secondary | ICD-10-CM

## 2017-09-17 DIAGNOSIS — Z79899 Other long term (current) drug therapy: Secondary | ICD-10-CM

## 2017-10-10 DIAGNOSIS — R42 Dizziness and giddiness: Secondary | ICD-10-CM | POA: Diagnosis not present

## 2017-11-09 ENCOUNTER — Other Ambulatory Visit: Payer: Self-pay | Admitting: Internal Medicine

## 2017-11-13 ENCOUNTER — Other Ambulatory Visit: Payer: Self-pay | Admitting: Internal Medicine

## 2017-11-15 DIAGNOSIS — H401134 Primary open-angle glaucoma, bilateral, indeterminate stage: Secondary | ICD-10-CM | POA: Diagnosis not present

## 2017-11-21 ENCOUNTER — Encounter: Payer: Self-pay | Admitting: Internal Medicine

## 2017-11-21 ENCOUNTER — Telehealth: Payer: Self-pay

## 2017-11-21 ENCOUNTER — Ambulatory Visit (INDEPENDENT_AMBULATORY_CARE_PROVIDER_SITE_OTHER): Payer: Medicare Other | Admitting: Internal Medicine

## 2017-11-21 VITALS — BP 120/70 | HR 89 | Temp 98.1°F | Ht 65.0 in | Wt 224.4 lb

## 2017-11-21 DIAGNOSIS — Z7982 Long term (current) use of aspirin: Secondary | ICD-10-CM

## 2017-11-21 DIAGNOSIS — Z23 Encounter for immunization: Secondary | ICD-10-CM | POA: Diagnosis not present

## 2017-11-21 DIAGNOSIS — N184 Chronic kidney disease, stage 4 (severe): Secondary | ICD-10-CM | POA: Insufficient documentation

## 2017-11-21 DIAGNOSIS — M79671 Pain in right foot: Secondary | ICD-10-CM | POA: Insufficient documentation

## 2017-11-21 DIAGNOSIS — I129 Hypertensive chronic kidney disease with stage 1 through stage 4 chronic kidney disease, or unspecified chronic kidney disease: Secondary | ICD-10-CM | POA: Diagnosis not present

## 2017-11-21 MED ORDER — GABAPENTIN 100 MG PO CAPS: ORAL_CAPSULE | ORAL | 1 refills | Status: DC

## 2017-11-21 NOTE — Telephone Encounter (Signed)
The pt wants to know if he can get something for his diabetes nerve pain because he has pain in the bottom of his feet.  The pt was offered an appt and he said to ask if a mediation can be sent to the pharmacy The pt was told that Dr Bobetta Lime will see him for an appt since he is already at the office.

## 2017-11-30 DIAGNOSIS — H401134 Primary open-angle glaucoma, bilateral, indeterminate stage: Secondary | ICD-10-CM | POA: Diagnosis not present

## 2017-12-09 ENCOUNTER — Encounter: Payer: Self-pay | Admitting: Internal Medicine

## 2017-12-09 NOTE — Progress Notes (Signed)
Subjective:     Patient ID: Justin Holloway , male    DOB: [birth year] , 84 y.o.   MRN: [id number]   Chief Complaint  Patient presents with  . Foot Pain    HPI  Foot Pain  This is a chronic problem. The current episode started 1 to 4 weeks ago. The problem occurs constantly. The problem has been unchanged. The symptoms are aggravated by walking. He has tried nothing for the symptoms.   He states pain in r foot is greater than left foot. Descrdibed as a burning sensation. Denies LE weakness. Sx also worsened at night.   Past Medical History:  Diagnosis Date  . Arthritis   . CKD (chronic kidney disease), stage III ([place])   . Diabetes mellitus without complication ([place])   . Glaucoma    "blurred vision", see Dr Gershon Crane yearly  . Hyperlipidemia   . Hypertension   . TIA (transient ischemic attack)      Family History  Problem Relation Age of Onset  . Cancer Mother 22       breast  . COPD Father 12       Congestive heart failure     Current Outpatient Medications:  .  aspirin EC 81 MG tablet, Take 81 mg by mouth every evening., Disp: , Rfl:  .  cholecalciferol (VITAMIN D) 1000 units tablet, Take 1,000 Units by mouth 2 (two) times daily., Disp: , Rfl:  .  furosemide (LASIX) 20 MG tablet, Take 40 mg by mouth daily. , Disp: , Rfl:  .  gabapentin (NEURONTIN) 100 MG capsule, One capsule po qhs, Disp: 30 capsule, Rfl: 1 .  hydrALAZINE (APRESOLINE) 25 MG tablet, TAKE 1 TABLET BY MOUTH THREE TIMES DAILY, Disp: 270 tablet, Rfl: 0 .  Insulin Degludec (TRESIBA FLEXTOUCH) 200 UNIT/ML SOPN, Inject 25 Units into the skin at bedtime., Disp: , Rfl:  .  loratadine (CLARITIN) 10 MG tablet, Take 1 tablet (10 mg total) by mouth daily., Disp: , Rfl:  .  Multiple Vitamin (MULTIVITAMIN WITH MINERALS) TABS tablet, Take 1 tablet by mouth daily., Disp: , Rfl:  .  pravastatin (PRAVACHOL) 40 MG tablet, TAKE 1 TABLET BY MOUTH EVERY DAY, Disp: 90 tablet, Rfl: 0 .  Semaglutide (OZEMPIC) 0.25 or 0.5 MG/DOSE  SOPN, Inject 0.5 mg into the skin once a week., Disp: , Rfl:    No Known Allergies   Review of Systems  Constitutional: Negative.   Respiratory: Negative.   Cardiovascular: Negative.   Gastrointestinal: Negative.   Genitourinary: Negative.   Psychiatric/Behavioral: Negative.      Today's Vitals   11/21/17 1511  BP: 120/70  Pulse: 89  Temp: 98.1 F (36.7 C)  TempSrc: Oral  SpO2: 98%  Weight: 224 lb 6.4 oz (101.8 kg)  Height: 5\' 5"  (1.651 m)  PainSc: 0-No pain   Body mass index is 37.34 kg/m.   Objective:  Physical Exam  Constitutional: He is oriented to person, place, and time. He appears well-developed and well-nourished.  HENT:  Head: Normocephalic and atraumatic.  Cardiovascular: Normal rate, regular rhythm and normal heart sounds.  Pulmonary/Chest: Effort normal and breath sounds normal.  Musculoskeletal:  Right foot is warm to touch, no erythema. Callus present. Dry, scaly skin.   Neurological: He is alert and oriented to person, place, and time.  Psychiatric: He has a normal mood and affect.  Nursing note and vitals reviewed.       Assessment And Plan:     1. Right foot pain  His symptoms are suggestive of diabetic neuropathy. I will start him on gabapentin 100mg  nightly. He is encouraged to call me in two weeks to let me know how he is doing. If needed, I will increase his dose to 2 capsules nightly. He will rto in 4-6 weeks for re-evaluation.   2. Hypertensive nephropathy  Well controlled. He will continue with current meds.   3. CKD (chronic kidney disease) stage 4, GFR 15-29 ml/min (HCC)  Chronic.   4. Need for influenza vaccination  - Flu vaccine HIGH DOSE PF (Fluzone High dose)        [name], MD

## 2017-12-18 DIAGNOSIS — E119 Type 2 diabetes mellitus without complications: Secondary | ICD-10-CM | POA: Diagnosis not present

## 2017-12-18 DIAGNOSIS — L84 Corns and callosities: Secondary | ICD-10-CM | POA: Diagnosis not present

## 2017-12-18 DIAGNOSIS — E1140 Type 2 diabetes mellitus with diabetic neuropathy, unspecified: Secondary | ICD-10-CM | POA: Diagnosis not present

## 2017-12-18 DIAGNOSIS — H401134 Primary open-angle glaucoma, bilateral, indeterminate stage: Secondary | ICD-10-CM | POA: Diagnosis not present

## 2017-12-26 ENCOUNTER — Ambulatory Visit (INDEPENDENT_AMBULATORY_CARE_PROVIDER_SITE_OTHER): Payer: Medicare Other | Admitting: Internal Medicine

## 2017-12-26 ENCOUNTER — Encounter: Payer: Self-pay | Admitting: Internal Medicine

## 2017-12-26 ENCOUNTER — Ambulatory Visit (INDEPENDENT_AMBULATORY_CARE_PROVIDER_SITE_OTHER): Payer: Medicare Other

## 2017-12-26 VITALS — BP 114/66 | HR 96 | Temp 97.9°F | Ht 65.0 in | Wt 223.0 lb

## 2017-12-26 DIAGNOSIS — Z794 Long term (current) use of insulin: Secondary | ICD-10-CM

## 2017-12-26 DIAGNOSIS — E1122 Type 2 diabetes mellitus with diabetic chronic kidney disease: Secondary | ICD-10-CM | POA: Diagnosis not present

## 2017-12-26 DIAGNOSIS — I129 Hypertensive chronic kidney disease with stage 1 through stage 4 chronic kidney disease, or unspecified chronic kidney disease: Secondary | ICD-10-CM

## 2017-12-26 DIAGNOSIS — E1141 Type 2 diabetes mellitus with diabetic mononeuropathy: Secondary | ICD-10-CM

## 2017-12-26 DIAGNOSIS — Z6837 Body mass index (BMI) 37.0-37.9, adult: Secondary | ICD-10-CM

## 2017-12-26 DIAGNOSIS — N184 Chronic kidney disease, stage 4 (severe): Secondary | ICD-10-CM | POA: Diagnosis not present

## 2017-12-26 DIAGNOSIS — Z0000 Encounter for general adult medical examination without abnormal findings: Secondary | ICD-10-CM | POA: Diagnosis not present

## 2017-12-26 LAB — CMP14+EGFR
ALK PHOS: 93 IU/L (ref 39–117)
ALT: 8 IU/L (ref 0–44)
AST: 15 IU/L (ref 0–40)
Albumin/Globulin Ratio: 1.6 (ref 1.2–2.2)
Albumin: 4.1 g/dL (ref 3.5–4.7)
BILIRUBIN TOTAL: 0.3 mg/dL (ref 0.0–1.2)
BUN/Creatinine Ratio: 16 (ref 10–24)
BUN: 46 mg/dL — AB (ref 8–27)
CHLORIDE: 102 mmol/L (ref 96–106)
CO2: 21 mmol/L (ref 20–29)
Calcium: 9.2 mg/dL (ref 8.6–10.2)
Creatinine, Ser: 2.94 mg/dL — ABNORMAL HIGH (ref 0.76–1.27)
GFR calc Af Amer: 21 mL/min/{1.73_m2} — ABNORMAL LOW (ref >59)
GFR calc non Af Amer: 18 mL/min/{1.73_m2} — ABNORMAL LOW (ref >59)
GLUCOSE: 150 mg/dL — AB (ref 65–99)
Globulin, Total: 2.6 g/dL (ref 1.5–4.5)
Potassium: 4.5 mmol/L (ref 3.5–5.2)
Sodium: 141 mmol/L (ref 134–144)
TOTAL PROTEIN: 6.7 g/dL (ref 6.0–8.5)

## 2017-12-26 LAB — POCT URINALYSIS DIPSTICK
BILIRUBIN UA: NEGATIVE
Glucose, UA: NEGATIVE
KETONES UA: NEGATIVE
Nitrite, UA: NEGATIVE
PH UA: 5 (ref 5.0–8.0)
Protein, UA: POSITIVE — AB
RBC UA: NEGATIVE
Spec Grav, UA: 1.02 (ref 1.010–1.025)
UROBILINOGEN UA: 0.2 U/dL

## 2017-12-26 LAB — HEMOGLOBIN A1C
ESTIMATED AVERAGE GLUCOSE: 171 mg/dL
HEMOGLOBIN A1C: 7.6 % — AB (ref 4.8–5.6)

## 2017-12-26 LAB — POCT UA - MICROALBUMIN
Creatinine, POC: 300 mg/dL
Microalbumin Ur, POC: 80 mg/L

## 2017-12-26 MED ORDER — GABAPENTIN 100 MG PO CAPS: ORAL_CAPSULE | ORAL | 0 refills | Status: DC

## 2017-12-26 NOTE — Patient Instructions (Signed)
Justin Holloway , Thank you for taking time to come for your Medicare Wellness Visit. I appreciate your ongoing commitment to your health goals. Please review the following plan we discussed and let me know if I can assist you in the future.   Screening recommendations/referrals: Colonoscopy: not required Recommended yearly ophthalmology/optometry visit for glaucoma screening and checkup Recommended yearly dental visit for hygiene and checkup  Vaccinations: Influenza vaccine: 11/2017 Pneumococcal vaccine: 10/2011 Tdap vaccine: 11/2012 Shingles vaccine:     Advanced directives: Advance directive discussed with you today. Even though you declined this today please call our office should you change your mind and we can give you the proper paperwork for you to fill out.   Conditions/risks identified: obesity  Next appointment: 03/28/2018 at 10;00  Preventive Care 82 Years and Older, Male Preventive care refers to lifestyle choices and visits with your health care provider that can promote health and wellness. What does preventive care include?  A yearly physical exam. This is also called an annual well check.  Dental exams once or twice a year.  Routine eye exams. Ask your health care provider how often you should have your eyes checked.  Personal lifestyle choices, including:  Daily care of your teeth and gums.  Regular physical activity.  Eating a healthy diet.  Avoiding tobacco and drug use.  Limiting alcohol use.  Practicing safe sex.  Taking low doses of aspirin every day.  Taking vitamin and mineral supplements as recommended by your health care provider. What happens during an annual well check? The services and screenings done by your health care provider during your annual well check will depend on your age, overall health, lifestyle risk factors, and family history of disease. Counseling  Your health care provider may ask you questions about your:  Alcohol  use.  Tobacco use.  Drug use.  Emotional well-being.  Home and relationship well-being.  Sexual activity.  Eating habits.  History of falls.  Memory and ability to understand (cognition).  Work and work Statistician. Screening  You may have the following tests or measurements:  Height, weight, and BMI.  Blood pressure.  Lipid and cholesterol levels. These may be checked every 5 years, or more frequently if you are over 37 years old.  Skin check.  Lung cancer screening. You may have this screening every year starting at age 49 if you have a 30-pack-year history of smoking and currently smoke or have quit within the past 15 years.  Fecal occult blood test (FOBT) of the stool. You may have this test every year starting at age 69.  Flexible sigmoidoscopy or colonoscopy. You may have a sigmoidoscopy every 5 years or a colonoscopy every 10 years starting at age 68.  Prostate cancer screening. Recommendations will vary depending on your family history and other risks.  Hepatitis C blood test.  Hepatitis B blood test.  Sexually transmitted disease (STD) testing.  Diabetes screening. This is done by checking your blood sugar (glucose) after you have not eaten for a while (fasting). You may have this done every 1-3 years.  Abdominal aortic aneurysm (AAA) screening. You may need this if you are a current or former smoker.  Osteoporosis. You may be screened starting at age 16 if you are at high risk. Talk with your health care provider about your test results, treatment options, and if necessary, the need for more tests. Vaccines  Your health care provider may recommend certain vaccines, such as:  Influenza vaccine. This is recommended every  year.  Tetanus, diphtheria, and acellular pertussis (Tdap, Td) vaccine. You may need a Td booster every 10 years.  Zoster vaccine. You may need this after age 42.  Pneumococcal 13-valent conjugate (PCV13) vaccine. One dose is  recommended after age 19.  Pneumococcal polysaccharide (PPSV23) vaccine. One dose is recommended after age 72. Talk to your health care provider about which screenings and vaccines you need and how often you need them. This information is not intended to replace advice given to you by your health care provider. Make sure you discuss any questions you have with your health care provider. Document Released: 01/22/2015 Document Revised: 09/15/2015 Document Reviewed: 10/27/2014 Elsevier Interactive Patient Education  2017 [place] Prevention in the Home Falls can cause injuries. They can happen to people of all ages. There are many things you can do to make your home safe and to help prevent falls. What can I do on the outside of my home?  Regularly fix the edges of walkways and driveways and fix any cracks.  Remove anything that might make you trip as you walk through a door, such as a raised step or threshold.  Trim any bushes or trees on the path to your home.  Use bright outdoor lighting.  Clear any walking paths of anything that might make someone trip, such as rocks or tools.  Regularly check to see if handrails are loose or broken. Make sure that both sides of any steps have handrails.  Any raised decks and porches should have guardrails on the edges.  Have any leaves, snow, or ice cleared regularly.  Use sand or salt on walking paths during winter.  Clean up any spills in your garage right away. This includes oil or grease spills. What can I do in the bathroom?  Use night lights.  Install grab bars by the toilet and in the tub and shower. Do not use towel bars as grab bars.  Use non-skid mats or decals in the tub or shower.  If you need to sit down in the shower, use a plastic, non-slip stool.  Keep the floor dry. Clean up any water that spills on the floor as soon as it happens.  Remove soap buildup in the tub or shower regularly.  Attach bath mats  securely with double-sided non-slip rug tape.  Do not have throw rugs and other things on the floor that can make you trip. What can I do in the bedroom?  Use night lights.  Make sure that you have a light by your bed that is easy to reach.  Do not use any sheets or blankets that are too big for your bed. They should not hang down onto the floor.  Have a firm chair that has side arms. You can use this for support while you get dressed.  Do not have throw rugs and other things on the floor that can make you trip. What can I do in the kitchen?  Clean up any spills right away.  Avoid walking on wet floors.  Keep items that you use a lot in easy-to-reach places.  If you need to reach something above you, use a strong step stool that has a grab bar.  Keep electrical cords out of the way.  Do not use floor polish or wax that makes floors slippery. If you must use wax, use non-skid floor wax.  Do not have throw rugs and other things on the floor that can make you trip. What can  I do with my stairs?  Do not leave any items on the stairs.  Make sure that there are handrails on both sides of the stairs and use them. Fix handrails that are broken or loose. Make sure that handrails are as long as the stairways.  Check any carpeting to make sure that it is firmly attached to the stairs. Fix any carpet that is loose or worn.  Avoid having throw rugs at the top or bottom of the stairs. If you do have throw rugs, attach them to the floor with carpet tape.  Make sure that you have a light switch at the top of the stairs and the bottom of the stairs. If you do not have them, ask someone to add them for you. What else can I do to help prevent falls?  Wear shoes that:  Do not have high heels.  Have rubber bottoms.  Are comfortable and fit you well.  Are closed at the toe. Do not wear sandals.  If you use a stepladder:  Make sure that it is fully opened. Do not climb a closed  stepladder.  Make sure that both sides of the stepladder are locked into place.  Ask someone to hold it for you, if possible.  Clearly mark and make sure that you can see:  Any grab bars or handrails.  First and last steps.  Where the edge of each step is.  Use tools that help you move around (mobility aids) if they are needed. These include:  Canes.  Walkers.  Scooters.  Crutches.  Turn on the lights when you go into a dark area. Replace any light bulbs as soon as they burn out.  Set up your furniture so you have a clear path. Avoid moving your furniture around.  If any of your floors are uneven, fix them.  If there are any pets around you, be aware of where they are.  Review your medicines with your doctor. Some medicines can make you feel dizzy. This can increase your chance of falling. Ask your doctor what other things that you can do to help prevent falls. This information is not intended to replace advice given to you by your health care provider. Make sure you discuss any questions you have with your health care provider. Document Released: 10/22/2008 Document Revised: 06/03/2015 Document Reviewed: 01/30/2014 Elsevier Interactive Patient Education  2017 [employer].    Hemoglobin A1c Test Why am I having this test? You may have the hemoglobin A1c test (HbA1c test) done to:  Evaluate your risk for developing diabetes (diabetes mellitus).  Diagnose diabetes.  Monitor long-term control of blood sugar (glucose) in people who have diabetes and help make treatment decisions. This test may be done with other blood glucose tests, such as fasting blood glucose and oral glucose tolerance tests. What is being tested? Hemoglobin is a type of protein in the blood that carries oxygen. Glucose attaches to hemoglobin to form glycated hemoglobin. This test checks the amount of glycated hemoglobin in your blood, which is a good indicator of the average amount of glucose in  your blood during the past 2-3 months. What kind of sample is taken?  A blood sample is required for this test. It is usually collected by inserting a needle into a blood vessel. Tell a health care provider about:  All medicines you are taking, including vitamins, herbs, eye drops, creams, and over-the-counter medicines.  Any blood disorders you have.  Any surgeries you have had.  Any medical conditions you have.  Whether you are pregnant or may be pregnant. How are the results reported? Your results will be reported as a percentage that indicates how much of your hemoglobin has glucose attached to it (is glycated). Your health care provider will compare your results to normal ranges that were established after testing a large group of people (reference ranges). Reference ranges may vary among labs and hospitals. For this test, common reference ranges are:  Adult or child without diabetes: 4-5.6%.  Adult or child with diabetes and good blood glucose control: less than 7%. What do the results mean? If you have diabetes:  A result of less than 7% is considered normal, meaning that your blood glucose is well controlled.  A result higher than 7% means that your blood glucose is not well controlled, and your treatment plan may need to be adjusted. If you do not have diabetes:  A result within the reference range is considered normal, meaning that you are not at high risk for diabetes.  A result of 5.7-6.4% means that you have a high risk of developing diabetes, and you may have prediabetes. Prediabetes is the condition of having a blood glucose level that is higher than it should be, but not high enough for you to be diagnosed with diabetes. Having prediabetes puts you at risk for developing type 2 diabetes (type 2 diabetes mellitus). You may have more tests, including a repeat HbA1c test.  Results of 6.5% or higher on two separate HbA1c tests mean that you have diabetes. You may have  more tests to confirm the diagnosis. Abnormally low HbA1c values may be caused by:  Pregnancy.  Severe blood loss.  Receiving donated blood (transfusions).  Low red blood cell count (anemia).  Long-term kidney failure.  Some unusual forms (variants) of hemoglobin. Talk with your health care provider about what your results mean. Questions to ask your health care provider Ask your health care provider, or the department that is doing the test:  When will my results be ready?  How will I get my results?  What are my treatment options?  What other tests do I need?  What are my next steps? Summary  The hemoglobin A1c test (HbA1c test) may be done to evaluate your risk for developing diabetes, to diagnose diabetes, and to monitor long-term control of blood sugar (glucose) in people who have diabetes and help make treatment decisions.  Hemoglobin is a type of protein in the blood that carries oxygen. Glucose attaches to hemoglobin to form glycated hemoglobin. This test checks the amount of glycated hemoglobin in your blood, which is a good indicator of the average amount of glucose in your blood during the past 2-3 months.  Talk with your health care provider about what your results mean. This information is not intended to replace advice given to you by your health care provider. Make sure you discuss any questions you have with your health care provider. Document Released: 01/18/2004 Document Revised: 08/08/2016 Document Reviewed: 08/08/2016 Elsevier Interactive Patient Education  2019 [employer].

## 2017-12-26 NOTE — Progress Notes (Signed)
Subjective:   Justin Holloway is a 84 y.o. male who presents for Medicare Annual/Subsequent preventive examination.  Review of Systems:  n/a Cardiac Risk Factors include: advanced age (>33men, >39 women);hypertension;obesity (BMI >30kg/m2);male gender     Objective:    Vitals: BP 114/66 (BP Location: Left Arm, Patient Position: Sitting)   Pulse 96   Temp 97.9 F (36.6 C) (Oral)   Ht 5\' 5"  (1.651 m)   Wt 223 lb (101.2 kg)   BMI 37.11 kg/m   Body mass index is 37.11 kg/m.  Advanced Directives 12/26/2017 09/09/2017 09/08/2017 01/06/2017 03/29/2016 08/07/2015 06/09/2013  Does Patient Have a Medical Advance Directive? No No No No Yes No Patient would not like information  Type of Advance Directive - - - - Living will;Healthcare Power of Attorney - -  Does patient want to make changes to medical advance directive? - - - - No - Patient declined - -  Copy of [place] in Chart? - - - - Yes - -  Would patient like information on creating a medical advance directive? No - Patient declined No - Patient declined No - Patient declined No - Patient declined - - -    Tobacco Social History   Tobacco Use  Smoking Status Former Smoker  . Types: Cigars  Smokeless Tobacco Never Used     Counseling given: Not Answered   Clinical Intake:  Pre-visit preparation completed: Yes  Pain : No/denies pain Pain Score: 0-No pain     Nutritional Status: BMI > 30  Obese Nutritional Risks: None Diabetes: Yes CBG done?: No Did pt. bring in CBG monitor from home?: No  How often do you need to have someone help you when you read instructions, pamphlets, or other written materials from your doctor or pharmacy?: 1 - Never What is the last grade level you completed in school?: 10 th grade   Interpreter Needed?: No  Information entered by :: NAllen LPN  Past Medical History:  Diagnosis Date  . Arthritis   . CKD (chronic kidney disease), stage III ([place])   . Diabetes mellitus  without complication ([place])   . Glaucoma    "blurred vision", see Dr Gershon Crane yearly  . Hyperlipidemia   . Hypertension   . TIA (transient ischemic attack)    Past Surgical History:  Procedure Laterality Date  . INGUINAL HERNIA REPAIR Left 06/09/2013   Procedure: OPEN REPAIR LEFT INGUINAL HERNIA;  Surgeon: [name], MD;  Location: [facility];  Service: General;  Laterality: Left;  . INSERTION OF MESH Left 06/09/2013   Procedure: INSERTION OF MESH;  Surgeon: [name], MD;  Location: [facility];  Service: General;  Laterality: Left;   Family History  Problem Relation Age of Onset  . Cancer Mother 22       breast  . COPD Father 101       Congestive heart failure   Social History   Socioeconomic History  . Marital status: Married    Spouse name: [name]  . Number of children: 8  . Years of education: 31  . Highest education level: Not on file  Occupational History  . Occupation: retired  Scientific laboratory technician  . Financial resource strain: Not hard at all  . Food insecurity:    Worry: Never true    Inability: Never true  . Transportation needs:    Medical: No    Non-medical: No  Tobacco Use  . Smoking status: Former Smoker    Types:  Cigars  . Smokeless tobacco: Never Used  Substance and Sexual Activity  . Alcohol use: No  . Drug use: No  . Sexual activity: Not Currently  Lifestyle  . Physical activity:    Days per week: 7 days    Minutes per session: 10 min  . Stress: Not at all  Relationships  . Social connections:    Talks on phone: Not on file    Gets together: Not on file    Attends religious service: Not on file    Active member of club or organization: Not on file    Attends meetings of clubs or organizations: Not on file    Relationship status: Not on file  Other Topics Concern  . Not on file  Social History Narrative   Lives with wife at home    caffeine- coffee 1 cup daily    Outpatient Encounter Medications as of 12/26/2017  Medication Sig  . aspirin EC  81 MG tablet Take 81 mg by mouth every evening.  . cholecalciferol (VITAMIN D) 1000 units tablet Take 1,000 Units by mouth 2 (two) times daily.  . furosemide (LASIX) 80 MG tablet Take 80 mg by mouth.  . gabapentin (NEURONTIN) 100 MG capsule Take 2 capsules po nightly  . hydrALAZINE (APRESOLINE) 25 MG tablet TAKE 1 TABLET BY MOUTH THREE TIMES DAILY  . Insulin Degludec (TRESIBA FLEXTOUCH) 200 UNIT/ML SOPN Inject 25 Units into the skin at bedtime.  [name] loratadine (CLARITIN) 10 MG tablet Take 1 tablet (10 mg total) by mouth daily.  . Multiple Vitamin (MULTIVITAMIN WITH MINERALS) TABS tablet Take 1 tablet by mouth daily.  . pravastatin (PRAVACHOL) 40 MG tablet TAKE 1 TABLET BY MOUTH EVERY DAY  . Semaglutide (OZEMPIC) 0.25 or 0.5 MG/DOSE SOPN Inject 0.5 mg into the skin once a week.   No facility-administered encounter medications on file as of 12/26/2017.     Activities of Daily Living In your present state of health, do you have any difficulty performing the following activities: 12/26/2017 09/09/2017  Hearing? N N  Vision? Y N  Comment had lasik surgery a month ago -  Difficulty concentrating or making decisions? N N  Walking or climbing stairs? N N  Dressing or bathing? N N  Doing errands, shopping? N N  Preparing Food and eating ? N -  Using the Toilet? N -  In the past six months, have you accidently leaked urine? N -  Do you have problems with loss of bowel control? N -  Managing your Medications? N -  Managing your Finances? N -  Housekeeping or managing your Housekeeping? N -  Some recent data might be hidden    Patient Care Team: [name], MD as PCP - General (Internal Medicine) Marylynn Pearson, MD as Consulting Physician (Ophthalmology)   Assessment:   This is a routine wellness examination for Justin Holloway.  Exercise Activities and Dietary recommendations Current Exercise Habits: Home exercise routine, Type of exercise: Other - see comments(push ups), Time (Minutes): 10,  Frequency (Times/Week): 7, Weekly Exercise (Minutes/Week): 70, Intensity: Moderate  Goals    . HEMOGLOBIN A1C < 7.0 (pt-stated)       Fall Risk Fall Risk  12/26/2017 12/26/2017 02/14/2017 03/29/2016 06/17/2014  Falls in the past year? 0 0 No No No   Is the patient's home free of loose throw rugs in walkways, pet beds, electrical cords, etc?   yes      Grab bars in the bathroom? yes  Handrails on the stairs?   n/a      Adequate lighting?   yes  Timed Get Up and Go Performed: n/a  Depression Screen PHQ 2/9 Scores 12/26/2017 12/26/2017 03/29/2016  PHQ - 2 Score 0 0 0  PHQ- 9 Score 0 - -    Cognitive Function     6CIT Screen 12/26/2017  What Year? 0 points  What month? 0 points  What time? 0 points  Count back from 20 0 points  Months in reverse 4 points  Repeat phrase 2 points  Total Score 6    Immunization History  Administered Date(s) Administered  . Influenza, High Dose Seasonal PF 11/21/2017  . Pneumococcal Polysaccharide-23 12/10/1998    Qualifies for Shingles Vaccine?  yes  Screening Tests Health Maintenance  Topic Date Due  . FOOT EXAM  10/03/1939  . OPHTHALMOLOGY EXAM  10/03/1939  . HEMOGLOBIN A1C  08/14/2017  . TETANUS/TDAP  11/12/2022  . INFLUENZA VACCINE  Completed  . PNA vac Low Risk Adult  Completed   Cancer Screenings: Lung: Low Dose CT Chest recommended if Age 72-80 years, 30 pack-year currently smoking OR have quit w/in 15years. Patient does not qualify. Colorectal: not required  Additional Screenings:  Hepatitis C Screening:n/a      Plan:    Would like to keep his A1C under control  I have personally reviewed and noted the following in the patient's chart:   . Medical and social history . Use of alcohol, tobacco or illicit drugs  . Current medications and supplements . Functional ability and status . Nutritional status . Physical activity . Advanced directives . List of other physicians . Hospitalizations, surgeries, and ER  visits in previous 12 months . Vitals . Screenings to include cognitive, depression, and falls . Referrals and appointments  In addition, I have reviewed and discussed with patient certain preventive protocols, quality metrics, and best practice recommendations. A written personalized care plan for preventive services as well as general preventive health recommendations were provided to patient.     [name], LPN  [id number]

## 2017-12-26 NOTE — Addendum Note (Signed)
Addended by: Glenna Durand E on: 12/26/2017 05:01 PM   Modules accepted: Orders

## 2017-12-27 NOTE — Progress Notes (Signed)
Here are your lab results:  Your kidney function is fairly stable. How much water do you drink daily? Your liver function is normal.   Your hba1c is 7.6! This is so much better! I am so proud of you! Keep up the great work!   Happy holidays to you and your family!  Sincerely,    Sevanna Ballengee N. [facility], MD

## 2018-01-06 ENCOUNTER — Encounter: Payer: Self-pay | Admitting: Internal Medicine

## 2018-01-06 NOTE — Progress Notes (Signed)
Subjective:     Patient ID: RASHUN GRATTAN , male    DOB: 08-May-1929 , 84 y.o.   MRN: [id number]   Chief Complaint  Patient presents with  . Gabapentin f/u    HPI  He is here today for f/u diabetic neuropathy. He was started on gabapentin 100mg  nightly at his last visit. He reports that he has had minimal improvement in his symptoms. He has no other concerns at this time.     Past Medical History:  Diagnosis Date  . Arthritis   . CKD (chronic kidney disease), stage III ([place])   . Diabetes mellitus without complication ([place])   . Glaucoma    "blurred vision", see Dr Gershon Crane yearly  . Hyperlipidemia   . Hypertension   . TIA (transient ischemic attack)      Family History  Problem Relation Age of Onset  . Cancer Mother 25       breast  . COPD Father 58       Congestive heart failure     Current Outpatient Medications:  .  aspirin EC 81 MG tablet, Take 81 mg by mouth every evening., Disp: , Rfl:  .  cholecalciferol (VITAMIN D) 1000 units tablet, Take 1,000 Units by mouth 2 (two) times daily., Disp: , Rfl:  .  furosemide (LASIX) 80 MG tablet, Take 80 mg by mouth., Disp: , Rfl:  .  hydrALAZINE (APRESOLINE) 25 MG tablet, TAKE 1 TABLET BY MOUTH THREE TIMES DAILY, Disp: 270 tablet, Rfl: 0 .  Insulin Degludec (TRESIBA FLEXTOUCH) 200 UNIT/ML SOPN, Inject 25 Units into the skin at bedtime., Disp: , Rfl:  .  loratadine (CLARITIN) 10 MG tablet, Take 1 tablet (10 mg total) by mouth daily., Disp: , Rfl:  .  Multiple Vitamin (MULTIVITAMIN WITH MINERALS) TABS tablet, Take 1 tablet by mouth daily., Disp: , Rfl:  .  pravastatin (PRAVACHOL) 40 MG tablet, TAKE 1 TABLET BY MOUTH EVERY DAY, Disp: 90 tablet, Rfl: 0 .  Semaglutide (OZEMPIC) 0.25 or 0.5 MG/DOSE SOPN, Inject 0.5 mg into the skin once a week., Disp: , Rfl:  .  gabapentin (NEURONTIN) 100 MG capsule, Take 2 capsules po nightly, Disp: 180 capsule, Rfl: 0   No Known Allergies   Review of Systems  Constitutional: Negative.    Respiratory: Negative.   Cardiovascular: Negative.   Genitourinary: Negative.   Psychiatric/Behavioral: Negative.      Today's Vitals   12/26/17 1104  BP: 114/66  Pulse: 96  Temp: 97.9 F (36.6 C)  TempSrc: Oral  Weight: 223 lb (101.2 kg)  Height: 5\' 5"  (1.651 m)  PainSc: 0-No pain   Body mass index is 37.11 kg/m.   Objective:  Physical Exam Vitals signs and nursing note reviewed.  Constitutional:      Appearance: Normal appearance. He is obese.  Cardiovascular:     Rate and Rhythm: Normal rate and regular rhythm.     Heart sounds: Normal heart sounds.  Pulmonary:     Effort: Pulmonary effort is normal.     Breath sounds: Normal breath sounds.  Skin:    General: Skin is warm.  Neurological:     General: No focal deficit present.     Mental Status: He is alert.  Psychiatric:        Mood and Affect: Mood normal.         Assessment And Plan:     1. Diabetic mononeuropathy associated with type 2 diabetes mellitus ([place])  I will increase his gabapentin to  2 capsules (200mg ) nightly. He is advised to contact me in two weeks to let me know how this works for him. I do not think I will increase his dose past 300 mg nightly.   2. Hypertensive nephropathy  Well controlled. He will continue with current meds. Importance of following a low salt diet was also discussed with the patient.   3. CKD (chronic kidney disease) stage 4, GFR 15-29 ml/min (HCC)  Chronic. He is encouraged to drink water daily.   4. Class 2 severe obesity due to excess calories with serious comorbidity and body mass index (BMI) of 37.0 to 37.9 in adult [facility])  He is encouraged to strive for BMI less than 32 to decrease cardiac risk. He is advised to start with 10 minutes of exercise five days weekly. He is also encouraged to eliminate sodas and processed foods from his diet.   [name], MD

## 2018-01-22 DIAGNOSIS — H401134 Primary open-angle glaucoma, bilateral, indeterminate stage: Secondary | ICD-10-CM | POA: Diagnosis not present

## 2018-01-28 ENCOUNTER — Telehealth: Payer: Self-pay | Admitting: Internal Medicine

## 2018-01-28 ENCOUNTER — Other Ambulatory Visit: Payer: Self-pay | Admitting: Internal Medicine

## 2018-01-28 DIAGNOSIS — E1141 Type 2 diabetes mellitus with diabetic mononeuropathy: Secondary | ICD-10-CM

## 2018-01-28 NOTE — Telephone Encounter (Signed)
Patient called and states that he had a spot on foot. He states it doesn't hurt and its not a sore. After asking Caleen Jobs I advised the patient to call his foot doctor for appointment

## 2018-02-11 ENCOUNTER — Telehealth: Payer: Self-pay | Admitting: *Deleted

## 2018-02-11 ENCOUNTER — Ambulatory Visit: Payer: Medicare Other | Admitting: Podiatrist

## 2018-02-11 VITALS — BP 127/63 | HR 96

## 2018-02-11 DIAGNOSIS — G5790 Unspecified mononeuropathy of unspecified lower limb: Secondary | ICD-10-CM | POA: Diagnosis not present

## 2018-02-11 DIAGNOSIS — M79675 Pain in left toe(s): Secondary | ICD-10-CM

## 2018-02-11 DIAGNOSIS — I739 Peripheral vascular disease, unspecified: Secondary | ICD-10-CM | POA: Diagnosis not present

## 2018-02-11 DIAGNOSIS — L84 Corns and callosities: Secondary | ICD-10-CM | POA: Diagnosis not present

## 2018-02-11 DIAGNOSIS — M79674 Pain in right toe(s): Secondary | ICD-10-CM

## 2018-02-11 DIAGNOSIS — B351 Tinea unguium: Secondary | ICD-10-CM | POA: Diagnosis not present

## 2018-02-11 DIAGNOSIS — E119 Type 2 diabetes mellitus without complications: Secondary | ICD-10-CM | POA: Diagnosis not present

## 2018-02-11 NOTE — Telephone Encounter (Signed)
Faxed orders to [facility]. 

## 2018-02-11 NOTE — Patient Instructions (Addendum)
  A test will be set up to see if you have any vascular disease in your feet or legs- you will be called regarding the appontment date and time and the location of the appointment  Peripheral Vascular Disease  Peripheral vascular disease (PVD) is a disease of the blood vessels that are not part of your heart and brain. A simple term for PVD is poor circulation. In most cases, PVD narrows the blood vessels that carry blood from your heart to the rest of your body. This can reduce the supply of blood to your arms, legs, and internal organs, like your stomach or kidneys. However, PVD most often affects a person's lower legs and feet. Without treatment, PVD tends to get worse. PVD can also lead to acute ischemic limb. This is when an arm or leg suddenly cannot get enough blood. This is a medical emergency. Follow these instructions at home: Lifestyle  Do not use any products that contain nicotine or tobacco, such as cigarettes and e-cigarettes. If you need help quitting, ask your doctor.  Lose weight if you are overweight. Or, stay at a healthy weight as told by your doctor.  Eat a diet that is low in fat and cholesterol. If you need help, ask your doctor.  Exercise regularly. Ask your doctor for activities that are right for you. General instructions  Take over-the-counter and prescription medicines only as told by your doctor.  Take good care of your feet: ? Wear comfortable shoes that fit well. ? Check your feet often for any cuts or sores.  Keep all follow-up visits as told by your doctor This is important. Contact a doctor if:  You have cramps in your legs when you walk.  You have leg pain when you are at rest.  You have coldness in a leg or foot.  Your skin changes.  You are unable to get or have an erection (erectile dysfunction).  You have cuts or sores on your feet that do not heal. Get help right away if:  Your arm or leg turns cold, numb, and blue.  Your arms or legs  become red, warm, swollen, painful, or numb.  You have chest pain.  You have trouble breathing.  You suddenly have weakness in your face, arm, or leg.  You become very confused or you cannot speak.  You suddenly have a very bad headache.  You suddenly cannot see. Summary  Peripheral vascular disease (PVD) is a disease of the blood vessels.  A simple term for PVD is poor circulation. Without treatment, PVD tends to get worse.  Treatment may include exercise, low fat and low cholesterol diet, and quitting smoking. This information is not intended to replace advice given to you by your health care provider. Make sure you discuss any questions you have with your health care provider. Document Released: 03/22/2009 Document Revised: 02/03/2016 Document Reviewed: 02/03/2016 Elsevier Interactive Patient Education  2019 [employer].

## 2018-02-12 ENCOUNTER — Encounter: Payer: Self-pay | Admitting: Podiatrist

## 2018-02-12 ENCOUNTER — Other Ambulatory Visit: Payer: Self-pay

## 2018-02-12 NOTE — Progress Notes (Signed)
     Chief Complaint  Patient presents with  . Foot Problem    Right plantar heel dark discoloration 69mo duration, no pain, no blood/drainage, no known injury.     HPI: Patient is 85 y.o. male who presents today for a dark area on his right heel of which he is concerned.  He also complains of very long toenails he would like to have trimmed back. Also relates pain in the right leg at night that is relieved by hanging the foot over the bed.        Review of Systems  GENERAL: Feels well no fevers, no fatigue, no changes in appetite SKIN: No itching, no rashes, dark skin on right heel EYES: No eye pain,no redness, no discharge EARS: No earache,no ringing of ears, no recent change in hearing NOSE: No congestion, no drainage, no bleeding  MOUTH/THROAT: No mouth pain, No sore throat, No difficulty chewing or swallowing  RESPIRATORY: No cough, no wheezing, no SOB CARDIAC: No chest pain,no heart palpitations,no new onset lower extremity edema  GI: No abdominal pain, No Nausea, no vomiting, no diarrhea, no heartburn or no reflux  GU: No dysuria, no increased frequency or urgency MUSCULOSKELETAL: No unrelieved bone/joint pain,  NEUROLOGIC: Awake, alert, appropriate to situation, No change in mental status. PSYCHIATRIC: No overt anxiety or sadness.No behavior issue.  AMBULATION:  Ambulates unassisted  Vitals:   02/11/18 1142  BP: 127/63  Pulse: 96    Physical Exam  GENERAL APPEARANCE: Alert, conversant. Appropriately groomed. No acute distress.  VASCULAR: Pedal pulses faintly palpable at 1/4 dp, pt non palpable bilateral.  Capillary refill time is slightly delayed to all digits,  Proximal to distal cooling it warm to warm.  Generalized swelling present bilateral.   NEUROLOGIC: sensation is intact epicritically and protectively to 5.07 monofilament at 2/5 sites bilateral.  Light touch is intact bilateral, vibratory sensation intact bilateral MUSCULOSKELETAL: acceptable muscle  strength, tone and stability bilateral.  Intrinsic muscluature intact bilateral.  Rectus appearance of foot and digits noted bilateral.   DERMATOLOGIC: hyperkeratotic lesion present right heel likely from his new diabetic shoes- no underlying ulceration present post debridement.  Nails are long, elongated, and thickened.  No sign of infection noted.    Assessment  Callus right heel Elongated toenails in presence of diabetes  Claudication symptoms   Plan  Debrided callus without complication, debrided toenails to a safe level,  Recommended a vascular study to assess if he has PVD.  Will set up the study and call with results.  He will return for continued care at regular 3 month intervals.

## 2018-02-15 ENCOUNTER — Other Ambulatory Visit: Payer: Self-pay | Admitting: Internal Medicine

## 2018-02-15 ENCOUNTER — Ambulatory Visit (HOSPITAL_COMMUNITY)
Admission: RE | Admit: 2018-02-15 | Discharge: 2018-02-15 | Disposition: A | Discharge status: Home / Self Care | Payer: Medicare Other | Source: Ambulatory Visit | Attending: Cardiovascular Disease | Admitting: Cardiovascular Disease

## 2018-02-15 DIAGNOSIS — I739 Peripheral vascular disease, unspecified: Secondary | ICD-10-CM | POA: Insufficient documentation

## 2018-02-17 ENCOUNTER — Other Ambulatory Visit: Payer: Self-pay | Admitting: Internal Medicine

## 2018-02-18 NOTE — Progress Notes (Signed)
Vascular study is normal- no evidence of PVD.

## 2018-02-21 ENCOUNTER — Telehealth: Payer: Self-pay | Admitting: *Deleted

## 2018-02-21 NOTE — Telephone Encounter (Signed)
-----   Message from Bronson Ing, DPM sent at 02/18/2018  2:11 PM EST ----- This patients vascular study was normal-- could you (or whoever makes these calls) call to let him know - and no follow up needed.  Thanks!!  ----- Message ----- From: Trula Slade, DPM Sent: 02/17/2018   5:19 PM EST To: Bronson Ing, DPM  Sent to Dr. Valentina Lucks as this is her patient.

## 2018-02-21 NOTE — Telephone Encounter (Signed)
I informed pt of Dr. Shaune Pollack review of results and orders.

## 2018-03-06 DIAGNOSIS — N189 Chronic kidney disease, unspecified: Secondary | ICD-10-CM | POA: Diagnosis not present

## 2018-03-06 DIAGNOSIS — N184 Chronic kidney disease, stage 4 (severe): Secondary | ICD-10-CM | POA: Diagnosis not present

## 2018-03-06 DIAGNOSIS — N2581 Secondary hyperparathyroidism of renal origin: Secondary | ICD-10-CM | POA: Diagnosis not present

## 2018-03-06 DIAGNOSIS — I129 Hypertensive chronic kidney disease with stage 1 through stage 4 chronic kidney disease, or unspecified chronic kidney disease: Secondary | ICD-10-CM | POA: Diagnosis not present

## 2018-03-06 DIAGNOSIS — E8770 Fluid overload, unspecified: Secondary | ICD-10-CM | POA: Diagnosis not present

## 2018-03-06 DIAGNOSIS — D631 Anemia in chronic kidney disease: Secondary | ICD-10-CM | POA: Diagnosis not present

## 2018-03-28 ENCOUNTER — Ambulatory Visit: Payer: Medicare Other | Admitting: Internal Medicine

## 2018-04-05 ENCOUNTER — Other Ambulatory Visit: Payer: Self-pay

## 2018-04-05 DIAGNOSIS — R42 Dizziness and giddiness: Secondary | ICD-10-CM

## 2018-04-05 MED ORDER — MECLIZINE HCL 12.5 MG PO TABS: 12.5000 mg | ORAL_TABLET | Freq: Three times a day (TID) | ORAL | Status: DC | PRN

## 2018-04-09 ENCOUNTER — Ambulatory Visit: Payer: Medicare Other | Admitting: Internal Medicine

## 2018-04-09 ENCOUNTER — Other Ambulatory Visit: Payer: Self-pay | Admitting: Internal Medicine

## 2018-04-17 ENCOUNTER — Ambulatory Visit: Payer: Self-pay

## 2018-04-17 DIAGNOSIS — N184 Chronic kidney disease, stage 4 (severe): Secondary | ICD-10-CM

## 2018-04-17 DIAGNOSIS — Z794 Long term (current) use of insulin: Principal | ICD-10-CM

## 2018-04-17 DIAGNOSIS — I129 Hypertensive chronic kidney disease with stage 1 through stage 4 chronic kidney disease, or unspecified chronic kidney disease: Secondary | ICD-10-CM

## 2018-04-17 DIAGNOSIS — E1122 Type 2 diabetes mellitus with diabetic chronic kidney disease: Secondary | ICD-10-CM

## 2018-04-17 NOTE — Patient Instructions (Signed)
Social Worker Visit Information   Materials provided: Verbal education about CCM program provided by phone  Justin Holloway was given information about Chronic Care Management services today including:  1. CCM service includes personalized support from designated clinical staff supervised by his physician, including individualized plan of care and coordination with other care providers 2. 24/7 contact phone numbers for assistance for urgent and routine care needs. 3. Service will only be billed when office clinical staff spend 20 minutes or more in a month to coordinate care. 4. Only one practitioner may furnish and bill the service in a calendar month. 5. The patient may stop CCM services at any time (effective at the end of the month) by phone call to the office staff. 6. The patient will be responsible for cost sharing (co-pay) of up to 20% of the service fee (after annual deductible is met).  Patient agreed to services and verbal consent obtained.   The patient verbalized understanding of instructions provided today and declined a print copy of patient instruction materials.   Follow up plan: A member of the CCM team will outreach the patient in the next 7-14 days   Daneen Schick, BSW, CDP [facility] Management Social Worker [phone]

## 2018-04-17 NOTE — Chronic Care Management (AMB) (Signed)
  Chronic Care Management   Telephone Outreach Note  04/17/2018 Name: Justin Holloway MRN: [id number] DOB: 07-05-1929  Referred by: himself.    I reached out to Mr. Linton Ham Sifuentes's spouse  today by phone in response to a referral sent by Mr. Linton Ham Bielefeld's spouse's  health plan. Mr. BORNA WESSINGER self-enrolled into the CCM program.  I briefly discussed care management needs related to DMII.  Mr. [name] was given information about Chronic Care Management services today including:  1. CCM service includes personalized support from designated clinical staff supervised by his physician, including individualized plan of care and coordination with other care providers 2. 24/7 contact phone numbers for assistance for urgent and routine care needs. 3. Service will only be billed when office clinical staff spend 20 minutes or more in a month to coordinate care. 4. Only one practitioner may furnish and bill the service in a calendar month. 5. The patient may stop CCM services at any time (effective at the end of the month) by phone call to the office staff. 6. The patient will be responsible for cost sharing (co-pay) of up to 20% of the service fee (after annual deductible is met).  Patient agreed to services and verbal consent obtained.    SDOH (Social Determinants of Health) Screening completed during today's call. No challenges noted at this time. The patient does not have an advance directive and is not interested in education at this time. SW explained the patient would be outreached by CCM Nurse Case Manager at a later date. SW provided direct contact number in the event future SW assistance is needed.  The CM team will reach out to the patient again over the next 7-14 days.  The patient has been provided with contact information for the chronic care management team and has been advised to call with any health related questions or concerns.    [name], MD has been notified  of this outreach and Mr. [name]'s decision and plan.   Daneen Schick, BSW, CDP [facility] Care Management Social Worker [phone]  Total time spent performing care coordination and/or care management activities with the patient by phone or face to face = 15 minutes.

## 2018-04-18 ENCOUNTER — Telehealth: Payer: Self-pay

## 2018-04-18 NOTE — Telephone Encounter (Signed)
I left a message with Mrs. Justin Holloway that the pt's samples of Ozempic and Tyler Aas are ready for pickup

## 2018-04-18 NOTE — Telephone Encounter (Signed)
I returned the pt's call and notified him that the office is out of tresiba and ozempic samples.

## 2018-04-25 ENCOUNTER — Telehealth: Payer: Self-pay

## 2018-04-30 ENCOUNTER — Other Ambulatory Visit: Payer: Self-pay | Admitting: Internal Medicine

## 2018-05-07 ENCOUNTER — Telehealth: Payer: Self-pay

## 2018-05-08 ENCOUNTER — Telehealth: Payer: Self-pay

## 2018-05-09 ENCOUNTER — Telehealth: Payer: Self-pay

## 2018-05-09 ENCOUNTER — Ambulatory Visit (INDEPENDENT_AMBULATORY_CARE_PROVIDER_SITE_OTHER): Payer: Medicare Other

## 2018-05-09 ENCOUNTER — Other Ambulatory Visit: Payer: Self-pay

## 2018-05-09 DIAGNOSIS — E1122 Type 2 diabetes mellitus with diabetic chronic kidney disease: Secondary | ICD-10-CM

## 2018-05-09 DIAGNOSIS — Z794 Long term (current) use of insulin: Secondary | ICD-10-CM | POA: Diagnosis not present

## 2018-05-09 DIAGNOSIS — I129 Hypertensive chronic kidney disease with stage 1 through stage 4 chronic kidney disease, or unspecified chronic kidney disease: Secondary | ICD-10-CM

## 2018-05-09 DIAGNOSIS — E1141 Type 2 diabetes mellitus with diabetic mononeuropathy: Secondary | ICD-10-CM | POA: Diagnosis not present

## 2018-05-09 DIAGNOSIS — N184 Chronic kidney disease, stage 4 (severe): Secondary | ICD-10-CM

## 2018-05-09 NOTE — Chronic Care Management (AMB) (Addendum)
Chronic Care Management   Initial Visit Note  05/09/2018 Name: PRANAY HILBUN MRN: [id number] DOB: 1929-06-07  Referred by: [name], MD Reason for referral : Chronic Care Management (INITIAL CCM RN TELEPHONE OUTREACH)   DEANNA BOEHLKE is a 85 y.o. year old male who is a primary care patient of [name], MD. The CCM team was consulted for assistance with chronic disease management and care coordination needs.   Review of patient status, including review of consultants reports, relevant laboratory and other test results, and collaboration with appropriate care team members and the patient's provider was performed as part of comprehensive patient evaluation and provision of chronic care management services.    I spoke with Mr. Loiseau by telephone today for his initial CCM RN outreach.   Objective:  BP Readings from Last 3 Encounters:  02/11/18 127/63  12/26/17 114/66  12/26/17 114/66   Lab Results  Component Value Date   HGBA1C 7.6 (H) 12/26/2017   HGBA1C 10.6 (H) 02/14/2017   HGBA1C 8.9 (H) 08/08/2015   Lab Results  Component Value Date   MICROALBUR 80 12/26/2017   LDLCALC 81 08/08/2015   CREATININE 2.94 (H) 12/26/2017    Goals Addressed      Patient Stated   . "I can't afford my diabetic medicines" (pt-stated)       Current Barriers:  [name] Knowledge Deficits related to resources to assist with cost of diabetic medications . Lacks caregiver support.   Nurse Case Manager Clinical Goal(s):  [name] Over the next 30 days, patient will work with embedded Pharm D [name] to address needs related to cost of Ozempic and [place].  Interventions:   Completed Initial CCM RN telephone outreach with patient  Evaluation of current treatment plan related to pharmacological treatment for Diabetes and patient's adherence to plan as established by provider . Reviewed medications with patient and discussed sending a referral to embedded Pharm D [name] to offer  and discuss any available financial resources . Discussed plans with patient for ongoing care management follow up and provided patient with direct contact information for care management team . Pharmacy referral for financial resources to help with cost affordability for Ozempic and [place] . Scheduled a CCM follow up call with patient for about 2 weeks   Patient Self Care Activities:   Verbalizes understanding of the education/information provided today . Self administers medications as prescribed . Attends all scheduled provider appointments . Calls pharmacy for medication refills . Performs ADL's independently . Performs IADL's independently . Calls provider office for new concerns or questions  Initial goal documentation          . "I have poor balance"       Current Barriers:  [name] Knowledge Deficits related to treatment management for impaired physical mobility . Diabetic Neuropathy . Peripheral Vascular Disease  Nurse Case Manager Clinical Goal(s):  [name] Over the next 30 days, patient will verbalize understanding of plan for in home PT evaluation and treatment for poor balance, strengthening and stamina.   Interventions:   Completed initial CCM RN outreach with patient   Evaluation of current treatment plan related to poor gait/balance, PVD and Neuropathy and patient's adherence to plan as established by provider . Provided education to patient re: the risk for falls due to gait disturbance; educated on benefits from PT to help improve balance, ambulation, strengthening and stamina . Assessed for falls, DME and home safety concerns . Instructed patient to wear ACE stockings to help promote circulation  and to elevate lower extremities while resting (pt will purchase OTC) . Collaborated with provider Dr. Baird Cancer regarding referral for in home PT evaluation and treatment . Discussed plans with patient for ongoing care management follow up and provided patient with direct contact  information for care management team . Scheduled a CCM Follow up call for about 2 weeks   Patient Self Care Activities:  . Self administers medications as prescribed . Attends all scheduled provider appointments . Calls pharmacy for medication refills . Performs ADL's independently . Performs IADL's independently . Calls provider office for new concerns or questions  Initial goal documentation     . "I need to get my A1C under 7"       Current Barriers:  [name] Knowledge Deficits related to disease process and Self Health management of Diabetes  Nurse Case Manager Clinical Goal(s):  [name] Over the next 60 days, patient will verbalize basic understanding of Diabetes Mellitus, type 2 disease process and self health management plan as evidenced by patient will understand how to meal plan with low carbs and use portion control and the plate method for best dietary management of Diabetes.   Interventions:   Completed initial CCM RN Telephone Outreach with patient . Evaluation of current treatment plan related to Diabetes and patient's adherence to plan as established by provider . Provided education to patient re: the importance of adhering to low carb diet, taking diabetic medications exactly as prescribed and implementing exercise as tolerated for best management of DM . Discussed plans with patient for ongoing care management follow up and provided patient with direct contact information for care management team . Advised patient, providing education and rationale, to check cbg every am before breakfast and record, calling CM RN for findings outside established parameters . Mailed printed materials to review for Diabetic meal planning, sls of hypo/hyperglycemia and knowing your A1C . Provided RN CM contact # and provided hours of availability . Scheduled a CCM Telephone Follow Up call with patient for about 2 weeks  Patient Self Care Activities:  . Self administers medications as prescribed .  Attends all scheduled provider appointments . Calls pharmacy for medication refills . Performs ADL's independently . Performs IADL's independently . Calls provider office for new concerns or questions  Initial goal documentation       The CM team will reach out to the patient again over the next 14 days.     Barb Merino, RN,CCM Care Management Coordinator [place] [facility]  Direct Phone: [phone]

## 2018-05-09 NOTE — Patient Instructions (Addendum)
Visit Information  Goals Addressed      Patient Stated   . "I can't afford my diabetic medicines" (pt-stated)       Current Barriers:  [name] Knowledge Deficits related to resources to assist with cost of diabetic medications . Lacks caregiver support.   Nurse Case Manager Clinical Goal(s):  [name] Over the next 30 days, patient will work with embedded Pharm D [name] to address needs related to cost of Ozempic and [place].  Interventions:   Completed Initial CCM RN telephone outreach with patient  Evaluation of current treatment plan related to pharmacological treatment for Diabetes and patient's adherence to plan as established by provider . Reviewed medications with patient and discussed sending a referral to embedded Pharm D [name] to offer and discuss any available financial resources . Discussed plans with patient for ongoing care management follow up and provided patient with direct contact information for care management team . Pharmacy referral for financial resources to help with cost affordability for Ozempic and [place] . Scheduled a CCM follow up call with patient for about 2 weeks   Patient Self Care Activities:   Verbalizes understanding of the education/information provided today . Self administers medications as prescribed . Attends all scheduled provider appointments . Calls pharmacy for medication refills . Performs ADL's independently . Performs IADL's independently . Calls provider office for new concerns or questions  Initial goal documentation      . "I have poor balance" (pt-stated)       Current Barriers:  [name] Knowledge Deficits related to treatment management for impaired physical mobility . Diabetic Neuropathy . Peripheral Vascular Disease  Nurse Case Manager Clinical Goal(s):  [name] Over the next 30 days, patient will verbalize understanding of plan for in home PT evaluation and treatment for poor balance, strengthening and stamina.    Interventions:   Completed initial CCM RN outreach with patient   Evaluation of current treatment plan related to poor gait/balance, PVD and Neuropathy and patient's adherence to plan as established by provider . Provided education to patient re: the risk for falls due to gait disturbance; educated on benefits from PT to help improve balance, ambulation, strengthening and stamina . Assessed for falls, DME and home safety concerns . Instructed patient to use ACE stockings to help promote circulation and elevate lower extremities while resting . Collaborated with provider Dr. Baird Cancer regarding referral for in home PT evaluation and treatment . Discussed plans with patient for ongoing care management follow up and provided patient with direct contact information for care management team . Scheduled a CCM Follow up call for about 2 weeks   Patient Self Care Activities:  . Self administers medications as prescribed . Attends all scheduled provider appointments . Calls pharmacy for medication refills . Performs ADL's independently . Performs IADL's independently . Calls provider office for new concerns or questions  Initial goal documentation     . "I need to get my A1C under 7" (pt-stated)       Current Barriers:  [name] Knowledge Deficits related to disease process and Self Health management of Diabetes  Nurse Case Manager Clinical Goal(s):  [name] Over the next 60 days, patient will verbalize basic understanding of Diabetes Mellitus, type 2 disease process and self health management plan as evidenced by patient will understand how to meal plan with low carbs and use portion control and the plate method for best dietary management of Diabetes.   Interventions:   Completed initial CCM RN Telephone Outreach with  patient . Evaluation of current treatment plan related to Diabetes and patient's adherence to plan as established by provider . Provided education to patient re: the importance of  adhering to low carb diet, taking diabetic medications exactly as prescribed and implementing exercise as tolerated for best management of DM . Discussed plans with patient for ongoing care management follow up and provided patient with direct contact information for care management team . Advised patient, providing education and rationale, to check cbg every am before breakfast and record, calling CM RN for findings outside established parameters . Mailed printed materials to review for Diabetic meal planning, sls of hypo/hyperglycemia and knowing your A1C . Provided RN CM contact # and provided hours of availability . Scheduled a CCM Telephone Follow Up call with patient for about 2 weeks  Patient Self Care Activities:  . Self administers medications as prescribed . Attends all scheduled provider appointments . Calls pharmacy for medication refills . Performs ADL's independently . Performs IADL's independently . Calls provider office for new concerns or questions  Initial goal documentation       The patient verbalized understanding of instructions provided today and declined a print copy of patient instruction materials.   The CM team will reach out to the patient again over the next 14 days.   Barb Merino, RN,CCM Care Management Coordinator [facility]  Direct Phone: [phone]

## 2018-05-13 ENCOUNTER — Other Ambulatory Visit: Payer: Self-pay | Admitting: Internal Medicine

## 2018-05-13 ENCOUNTER — Ambulatory Visit: Payer: Medicare Other | Admitting: Podiatrist

## 2018-05-14 ENCOUNTER — Telehealth: Payer: Self-pay

## 2018-05-14 DIAGNOSIS — N184 Chronic kidney disease, stage 4 (severe): Secondary | ICD-10-CM | POA: Diagnosis not present

## 2018-05-15 ENCOUNTER — Telehealth: Payer: Self-pay

## 2018-05-16 ENCOUNTER — Ambulatory Visit: Payer: Self-pay | Admitting: Pharmacist

## 2018-05-16 DIAGNOSIS — N184 Chronic kidney disease, stage 4 (severe): Secondary | ICD-10-CM

## 2018-05-16 DIAGNOSIS — I129 Hypertensive chronic kidney disease with stage 1 through stage 4 chronic kidney disease, or unspecified chronic kidney disease: Secondary | ICD-10-CM

## 2018-05-16 DIAGNOSIS — E1122 Type 2 diabetes mellitus with diabetic chronic kidney disease: Secondary | ICD-10-CM

## 2018-05-16 DIAGNOSIS — Z794 Long term (current) use of insulin: Principal | ICD-10-CM

## 2018-05-16 NOTE — Progress Notes (Signed)
Chronic Care Management   Initial Visit Note  05/16/2018 Name: Justin Holloway MRN: [id number] DOB: November 08, 1929  Referred by: [name], MD Reason for referral : Chronic Care Management   Justin Holloway is a 85 y.o. year old male who is a primary care patient of [name], MD. The CCM team was consulted for assistance with chronic disease management and care coordination needs.   Review of patient status, including review of consultants reports, relevant laboratory and other test results, and collaboration with appropriate care team members and the patient's provider was performed as part of comprehensive patient evaluation and provision of chronic care management services.    Objective:   Goals Addressed            This Visit's Progress     Patient Stated   . "I can't afford my diabetic medicines" (pt-stated)       Current Barriers:  [name] Knowledge Deficits related to resources to assist with cost of diabetic medications . Lacks caregiver support.   Nurse Case Manager Clinical Goal(s):  [name] Over the next 30 days, patient will work with embedded Pharm D [name] to address needs related to cost of Ozempic and [place].  Interventions:   Completed CCM PharmD telephone outreach with patient  Evaluation of current treatment plan related to pharmacological treatment for Diabetes and patient's adherence to plan as established by provider . Reviewed medications with patient-->updated medication list per patient report & dispense history . Discussed plans with patient for ongoing care management follow up and provided patient with direct contact information for care management team . Collaboration with [facility] CPhT, Etter Sjogren for patient assistance programs for [employer] through [employer] (provides free pen needles as well).  Patient qualifies based on initial screening. [name] Applications mailed to patient and faxed to provider. . Scheduled a CCM follow up call  with patient for about 2 weeks   Patient Self Care Activities:   Verbalizes understanding of the education/information provided today . Self administers medications as prescribed . Attends all scheduled provider appointments . Calls pharmacy for medication refills . Performs ADL's independently . Performs IADL's independently . Calls provider office for new concerns or questions  Please see past updates related to this goal by clicking on the "Past Updates" button in the selected goal       . I need a glucometer. (pt-stated)       Current Barriers:  . Financial Barriers-patient is using relatives glucometer and is worried about cost  Pharmacist Clinical Goal(s):  [name] Over the next 14 days, patient will work with CCM team to address needs related to obtaining glucometer  Interventions: . Comprehensive medication review performed. . Collaboration with provider re: medication management . Collaboration with RN Care Manager re: diabetes/regimen  . Request sent to CMA/provider for new glucometer . Patient states his FBG have been 80-85.  Encouraged him to keep up the great work.  He injects his Ozempic on Fridays and is comfortable with his reigmen. . DM patient stable on ASA/statin for prevention.  Patient Self Care Activities:  . Self administers medications as prescribed . Attends all scheduled provider appointments . Calls pharmacy for medication refills  Initial goal documentation        Plan:   The CM team will reach out to the patient again over the next 14 days.     [name], PharmD, BCPS Clinical Pharmacist, [facility] [place]  Direct Dial: [phone]

## 2018-05-16 NOTE — Patient Instructions (Signed)
Visit Information  Goals Addressed            This Visit's Progress     Patient Stated   . "I can't afford my diabetic medicines" (pt-stated)       Current Barriers:  [name] Knowledge Deficits related to resources to assist with cost of diabetic medications . Lacks caregiver support.   Nurse Case Manager Clinical Goal(s):  [name] Over the next 30 days, patient will work with embedded Pharm D [name] to address needs related to cost of Ozempic and [place].  Interventions:   Completed CCM PharmD telephone outreach with patient  Evaluation of current treatment plan related to pharmacological treatment for Diabetes and patient's adherence to plan as established by provider . Reviewed medications with patient-->updated medication list per patient report & dispense history . Discussed plans with patient for ongoing care management follow up and provided patient with direct contact information for care management team . Collaboration with [facility] CPhT, Etter Sjogren for patient assistance programs for [employer] through [employer] (provides free pen needles as well).  Patient qualifies based on initial screening. [name] Applications mailed to patient and faxed to provider. . Scheduled a CCM follow up call with patient for about 2 weeks   Patient Self Care Activities:   Verbalizes understanding of the education/information provided today . Self administers medications as prescribed . Attends all scheduled provider appointments . Calls pharmacy for medication refills . Performs ADL's independently . Performs IADL's independently . Calls provider office for new concerns or questions  Please see past updates related to this goal by clicking on the "Past Updates" button in the selected goal       . I need a glucometer. (pt-stated)       Current Barriers:  . Financial Barriers-patient is using relatives glucometer and is worried about cost  Pharmacist Clinical Goal(s):  [name] Over the next  14 days, patient will work with CCM team to address needs related to obtaining glucometer  Interventions: . Comprehensive medication review performed. . Collaboration with provider re: medication management . Collaboration with RN Care Manager re: diabetes/regimen  . Request sent to CMA/provider for new glucometer . Patient states his FBG have been 80-85.  Encouraged him to keep up the great work.  He injects his Ozempic on Fridays and is comfortable with his reigmen. . DM patient stable on ASA/statin for prevention.  Patient Self Care Activities:  . Self administers medications as prescribed . Attends all scheduled provider appointments . Calls pharmacy for medication refills  Initial goal documentation        The patient verbalized understanding of instructions provided today and declined a print copy of patient instruction materials.   The CM team will reach out to the patient again over the next 14 days.   [name], PharmD, BCPS Clinical Pharmacist, [facility] [place]: [phone]

## 2018-05-23 ENCOUNTER — Telehealth: Payer: Self-pay

## 2018-05-23 ENCOUNTER — Ambulatory Visit (INDEPENDENT_AMBULATORY_CARE_PROVIDER_SITE_OTHER): Payer: Medicare Other | Admitting: Pharmacist

## 2018-05-23 DIAGNOSIS — Z794 Long term (current) use of insulin: Secondary | ICD-10-CM

## 2018-05-23 DIAGNOSIS — E1122 Type 2 diabetes mellitus with diabetic chronic kidney disease: Secondary | ICD-10-CM | POA: Diagnosis not present

## 2018-05-23 DIAGNOSIS — N184 Chronic kidney disease, stage 4 (severe): Secondary | ICD-10-CM | POA: Diagnosis not present

## 2018-05-24 ENCOUNTER — Other Ambulatory Visit: Payer: Self-pay

## 2018-05-24 ENCOUNTER — Other Ambulatory Visit: Payer: Self-pay | Admitting: Pharmacy Technician

## 2018-05-24 MED ORDER — ONETOUCH DELICA LANCETS 33G MISC: 11 refills | Status: DC

## 2018-05-24 MED ORDER — ONETOUCH ULTRA 2 W/DEVICE KIT: PACK | 1 refills | Status: DC

## 2018-05-24 MED ORDER — ONETOUCH ULTRA BLUE VI STRP: ORAL_STRIP | 11 refills | Status: DC

## 2018-05-24 NOTE — Patient Outreach (Signed)
[facility]) Care Management  05/24/2018  Justin Holloway April 07, 1929 [id number]                          Medication Assistance Referral  Referral From: [facility] RPh)  Medication/Company: Tyler Aas and Larna Daughters / [employer] Patient application portion:  Mailed Provider application portion: Faxed  to Dr. Baird Cancer   Follow up:  Will collaborate with [name] in 7-10 business days to confirm application(s) have been received.  Maud Deed Chana [place] Certified Pharmacy Technician [place] [facility] Direct [phone]

## 2018-05-28 ENCOUNTER — Ambulatory Visit (INDEPENDENT_AMBULATORY_CARE_PROVIDER_SITE_OTHER): Payer: Medicare Other | Admitting: Internal Medicine

## 2018-05-28 ENCOUNTER — Other Ambulatory Visit: Payer: Self-pay | Admitting: Internal Medicine

## 2018-05-28 ENCOUNTER — Encounter: Payer: Self-pay | Admitting: Internal Medicine

## 2018-05-28 ENCOUNTER — Other Ambulatory Visit: Payer: Self-pay

## 2018-05-28 VITALS — Ht 65.0 in | Wt 220.0 lb

## 2018-05-28 DIAGNOSIS — R2689 Other abnormalities of gait and mobility: Secondary | ICD-10-CM | POA: Diagnosis not present

## 2018-05-28 DIAGNOSIS — I129 Hypertensive chronic kidney disease with stage 1 through stage 4 chronic kidney disease, or unspecified chronic kidney disease: Secondary | ICD-10-CM | POA: Diagnosis not present

## 2018-05-28 DIAGNOSIS — N184 Chronic kidney disease, stage 4 (severe): Secondary | ICD-10-CM

## 2018-05-28 DIAGNOSIS — R269 Unspecified abnormalities of gait and mobility: Secondary | ICD-10-CM | POA: Diagnosis not present

## 2018-05-28 DIAGNOSIS — Z6836 Body mass index (BMI) 36.0-36.9, adult: Secondary | ICD-10-CM

## 2018-05-28 DIAGNOSIS — E1122 Type 2 diabetes mellitus with diabetic chronic kidney disease: Secondary | ICD-10-CM

## 2018-05-28 DIAGNOSIS — K5903 Drug induced constipation: Secondary | ICD-10-CM

## 2018-05-28 DIAGNOSIS — Z794 Long term (current) use of insulin: Secondary | ICD-10-CM

## 2018-05-28 NOTE — Progress Notes (Signed)
Virtual Visit via Video   This visit type was conducted due to national recommendations for restrictions regarding the COVID-19 Pandemic (e.g. social distancing) in an effort to limit this patient's exposure and mitigate transmission in our community.  Due to his co-morbid illnesses, this patient is at least at moderate risk for complications without adequate follow up.  This format is felt to be most appropriate for this patient at this time.  All issues noted in this document were discussed and addressed.  A limited physical exam was performed with this format.    This visit type was conducted due to national recommendations for restrictions regarding the COVID-19 Pandemic (e.g. social distancing) in an effort to limit this patient's exposure and mitigate transmission in our community.  Patients identity confirmed using two different identifiers.  This format is felt to be most appropriate for this patient at this time.  All issues noted in this document were discussed and addressed.  No physical exam was performed (except for noted visual exam findings with Video Visits).    Date:  05/28/2018   ID:  Justin Holloway, DOB [birth year], MRN [id number]  Patient Location:  Home, accompanied by wife and daughter  Provider location:   Office    Chief Complaint:  Diabetes check  History of Present Illness:    Justin Holloway is a 85 y.o. male who presents via video conferencing for a telehealth visit today.    The patient does not have symptoms concerning for COVID-19 infection (fever, chills, cough, or new shortness of breath).   He presents today for virtual visit. He prefers this method of contact due to COVID-19 pandemic.  Diabetes  He presents for his follow-up diabetic visit. He has type 2 diabetes mellitus. His disease course has been stable. There are no hypoglycemic associated symptoms. Pertinent negatives for diabetes include no blurred vision and no chest pain. There are no  hypoglycemic complications. Risk factors for coronary artery disease include dyslipidemia, hypertension, diabetes mellitus, male sex, sedentary lifestyle and obesity. He is compliant with treatment most of the time. He is following a generally healthy diet. He participates in exercise intermittently.  Hypertension  This is a chronic problem. The current episode started more than 1 year ago. The problem is controlled. Pertinent negatives include no blurred vision, chest pain, palpitations or shortness of breath. Hypertensive end-organ damage includes kidney disease.     Past Medical History:  Diagnosis Date  . Arthritis   . CKD (chronic kidney disease), stage III ([place])   . Diabetes mellitus without complication ([place])   . Glaucoma    "blurred vision", see Dr Gershon Crane yearly  . Hyperlipidemia   . Hypertension   . TIA (transient ischemic attack)    Past Surgical History:  Procedure Laterality Date  . INGUINAL HERNIA REPAIR Left 06/09/2013   Procedure: OPEN REPAIR LEFT INGUINAL HERNIA;  Surgeon: [name], MD;  Location: [facility];  Service: General;  Laterality: Left;  . INSERTION OF MESH Left 06/09/2013   Procedure: INSERTION OF MESH;  Surgeon: [name], MD;  Location: [facility];  Service: General;  Laterality: Left;     No outpatient medications have been marked as taking for the 05/28/18 encounter (Office Visit) with [name], MD.   Current Facility-Administered Medications for the 05/28/18 encounter (Office Visit) with [name], MD  Medication  . meclizine (ANTIVERT) tablet 12.5 mg     Allergies:   Patient has no known allergies.   Social History  Tobacco Use  . Smoking status: Former Smoker    Types: Cigars  . Smokeless tobacco: Never Used  Substance Use Topics  . Alcohol use: No  . Drug use: No     Family Hx: The patient's family history includes COPD (age of onset: 15) in his father; Cancer (age of onset: 33) in his mother.  ROS:   Please see the  history of present illness.    Review of Systems  Constitutional: Negative.   Eyes: Negative for blurred vision.  Respiratory: Negative.  Negative for shortness of breath.   Cardiovascular: Negative.  Negative for chest pain and palpitations.  Gastrointestinal: Positive for constipation (he feels the ozempic has caused him to have constipation. He does not think he can go on like this. States he only had one BM in the past week. ).  Neurological: Negative.        Reports not having good balance. Has spoken with CCM team, okay with starting PT  Psychiatric/Behavioral: Negative.     All other systems reviewed and are negative.   Labs/Other Tests and Data Reviewed:    Recent Labs: 09/08/2017: Hemoglobin 11.3; Platelets 195 12/26/2017: ALT 8; BUN 46; Creatinine, Ser 2.94; Potassium 4.5; Sodium 141   Recent Lipid Panel Lab Results  Component Value Date/Time   CHOL 131 08/08/2015 06:14 AM   TRIG 83 08/08/2015 06:14 AM   HDL 33 (L) 08/08/2015 06:14 AM   CHOLHDL 4.0 08/08/2015 06:14 AM   LDLCALC 81 08/08/2015 06:14 AM    Wt Readings from Last 3 Encounters:  05/28/18 220 lb (99.8 kg)  12/26/17 223 lb (101.2 kg)  12/26/17 223 lb (101.2 kg)     Exam:    Vital Signs:  Ht _0  (1.651 m)   Wt 220 lb (99.8 kg) Comment: pt provided  BMI 36.61 kg/m     Physical Exam  Constitutional: He is oriented to person, place, and time and well-developed, well-nourished, and in no distress.  HENT:  Head: Normocephalic and atraumatic.  Neck: Normal range of motion.  Pulmonary/Chest: Effort normal.  Neurological: He is alert and oriented to person, place, and time.  Psychiatric: Affect normal.  Nursing note and vitals reviewed.   ASSESSMENT & PLAN:     1. Type 2 diabetes mellitus with stage 4 chronic kidney disease, with long-term current use of insulin ([place])  He agrees to come in next week for labwork. He will rto in 3 months for next diabetes check. I do not have a sample today of  Ozempic, unfortunately. He is encouraged to speak with Glenard Haring regarding setting an appt with [name], CCM Pharmacist for help with medication assistance.   - Lipid panel; Future - CMP14+EGFR; Future - Hemoglobin A1c; Future  2. Hypertensive nephropathy  Chronic, usually controlled. He is encouraged to continue to restrict his salt intake.   3. Loss of balance   Chronic, he has had Neuro eval in the past. He agrees to Home health PT evaluation.   4. Gait abnormality  Chronic. Please see #3. Will have PT also provide HSE and determine which DME would best serve the patient.   5. Drug-induced constipation  He is advised to take stool softener daily. Pt is currently out of Ozempic, so his symptoms should improve over the next several days. He is advised to take stool softener twice daily once he resumes Ozempic. Also encouraged to increase daily activity to help with bowel motility. He will let me know if his symptoms persist.  6. Class 2 severe obesity due to excess calories with serious comorbidity and body mass index (BMI) of 36.0 to 36.9 in adult [facility])  Importance of achieving optimal weight to decrease risk of cardiovascular disease and cancers was discussed with the patient in full detail. She is encouraged to start slowly - start with 10 minutes twice daily at least three to four days per week and to gradually build to 30 minutes five days weekly. She was given tips to incorporate more activity into her daily routine - take stairs when possible, park farther away from her job, grocery stores, etc.      COVID-19 Education: The signs and symptoms of COVID-19 were discussed with the patient and how to seek care for testing (follow up with PCP or arrange E-visit).  The importance of social distancing was discussed today.  Patient Risk:   After full review of this patients clinical status, I feel that they are at least moderate risk at this time.  Time:   Today, I have spent 13 minutes  with the patient with telehealth technology discussing above diagnoses.     Medication Adjustments/Labs and Tests Ordered: Current medicines are reviewed at length with the patient today.  Concerns regarding medicines are outlined above.   Tests Ordered: Orders Placed This Encounter  Procedures  . Lipid panel  . CMP14+EGFR  . Hemoglobin A1c  . Ambulatory referral to Home Health    Medication Changes: No orders of the defined types were placed in this encounter.   Disposition:  Follow up in 3 month(s)  Signed, [name], MD

## 2018-05-30 ENCOUNTER — Telehealth: Payer: Self-pay

## 2018-05-30 NOTE — Patient Instructions (Signed)
Visit Information  Goals Addressed            This Visit's Progress     Patient Stated   . "I can't afford my diabetic medicines" (pt-stated)       Current Barriers:  Justin Holloway Knowledge Deficits related to resources to assist with cost of diabetic medications . Lacks caregiver support.   Nurse Case Manager Clinical Goal(s):  Justin Holloway Over the next 30 days, patient will work with CCM team to address needs related to cost of Ozempic and [place].  Interventions:   Completed CCM PharmD telephone outreach with patient  Evaluation of current treatment plan related to pharmacological treatment for Diabetes and patient's adherence to plan as established by provider . Reviewed medications with patient-->updated medication list per patient report & dispense history . Discussed plans with patient for ongoing care management follow up and provided patient with direct contact information for care management team . Collaboration with [facility] CPhT, Etter Sjogren for patient assistance programs for [employer] through [employer] (provides free pen needles as well).  Patient qualifies based on initial screening. Justin Holloway Applications mailed to patient and faxed to provider. . Scheduled a CCM follow up call with patient for about 2 weeks   Patient Self Care Activities:   Verbalizes understanding of the education/information provided today . Self administers medications as prescribed . Attends all scheduled provider appointments . Calls pharmacy for medication refills . Performs ADL's independently . Performs IADL's independently . Calls provider office for new concerns or questions  Please see past updates related to this goal by clicking on the "Past Updates" button in the selected goal       . I need a glucometer. (pt-stated)       Current Barriers:  . Financial Barriers-patient is using relatives glucometer and is worried about cost  Pharmacist Clinical Goal(s):  Justin Holloway Over the next 14 days, patient  will work with CCM team to address needs related to obtaining glucometer  Interventions: . Comprehensive medication review performed. . Collaboration with provider re: medication management . Collaboration with RN Care Manager re: diabetes/regimen  . Request sent to CMA/provider for new glucometer . Patient states his FBG have been 80-85.  Encouraged him to keep up the great work.  He injects his Ozempic on Fridays and is comfortable with his reigmen. . DM patient stable on ASA/statin for prevention.  Patient Self Care Activities:  . Self administers medications as prescribed . Attends all scheduled provider appointments . Calls pharmacy for medication refills  Please see past updates related to this goal by clicking on the "Past Updates" button in the selected goal         The patient verbalized understanding of instructions provided today and declined a print copy of patient instruction materials.   The CM team will reach out to the patient again over the next 14 days.    [name], PharmD, BCPS Clinical Pharmacist, [facility] [place]: [phone]

## 2018-05-30 NOTE — Progress Notes (Signed)
Chronic Care Management   Initial Visit Note  05/23/2018 Name: Justin Holloway MRN: [id number] DOB: 11-Dec-1929  Referred by: [name], MD Reason for referral : Chronic Care Management   Justin Holloway is a 85 y.o. year old male who is a primary care patient of [name], MD. The CCM team was consulted for assistance with chronic disease management and care coordination needs.   Review of patient status, including review of consultants reports, relevant laboratory and other test results, and collaboration with appropriate care team members and the patient's provider was performed as part of comprehensive patient evaluation and provision of chronic care management services.    I spoke with Mr. Skellenger by telephone today.  Objective:   Goals Addressed            This Visit's Progress     Patient Stated   . "I can't afford my diabetic medicines" (pt-stated)       Current Barriers:  [name] Knowledge Deficits related to resources to assist with cost of diabetic medications . Lacks caregiver support.   Nurse Case Manager Clinical Goal(s):  [name] Over the next 30 days, patient will work with CCM team to address needs related to cost of Ozempic and [place].  Interventions:   Completed CCM PharmD telephone outreach with patient  Evaluation of current treatment plan related to pharmacological treatment for Diabetes and patient's adherence to plan as established by provider . Reviewed medications with patient-->updated medication list per patient report & dispense history . Discussed plans with patient for ongoing care management follow up and provided patient with direct contact information for care management team . Collaboration with [facility] CPhT, Etter Sjogren for patient assistance programs for [employer] through [employer] (provides free pen needles as well).  Patient qualifies based on initial screening. [name] Applications mailed to patient and faxed to provider. .  Scheduled a CCM follow up call with patient for about 2 weeks   Patient Self Care Activities:   Verbalizes understanding of the education/information provided today . Self administers medications as prescribed . Attends all scheduled provider appointments . Calls pharmacy for medication refills . Performs ADL's independently . Performs IADL's independently . Calls provider office for new concerns or questions  Please see past updates related to this goal by clicking on the "Past Updates" button in the selected goal       . I need a glucometer. (pt-stated)       Current Barriers:  . Financial Barriers-patient is using relatives glucometer and is worried about cost  Pharmacist Clinical Goal(s):  [name] Over the next 14 days, patient will work with CCM team to address needs related to obtaining glucometer  Interventions: . Comprehensive medication review performed. . Collaboration with provider re: medication management . Collaboration with RN Care Manager re: diabetes/regimen  . Request sent to CMA/provider for new glucometer . Patient states his FBG have been 80-85.  Encouraged him to keep up the great work.  He injects his Ozempic on Fridays and is comfortable with his reigmen. . DM patient stable on ASA/statin for prevention.  Patient Self Care Activities:  . Self administers medications as prescribed . Attends all scheduled provider appointments . Calls pharmacy for medication refills  Please see past updates related to this goal by clicking on the "Past Updates" button in the selected goal          Plan:   The CM team will reach out to the patient again over the next  14 days.   [name], PharmD, BCPS Clinical Pharmacist, [facility] [place]: [phone]

## 2018-06-04 ENCOUNTER — Other Ambulatory Visit: Payer: Self-pay | Admitting: Pharmacy Technician

## 2018-06-04 NOTE — Patient Outreach (Signed)
[facility]) Care Management  06/04/2018  ZEV BLUE [birth year] [id number]   Incoming call from patient wanting to review required documents that need to be sent in with patient assistance application for Ozempic and [place]. Informed he would would only need to send in a copy of his and his spouses proof of income.  He states that he will make copies and send all documents back in.  Maud Deed Chana Bode Yaak Certified Pharmacy Technician [place] Management Direct Dial:[phone]

## 2018-06-05 ENCOUNTER — Other Ambulatory Visit: Payer: Medicare Other

## 2018-06-07 ENCOUNTER — Telehealth: Payer: Self-pay

## 2018-06-10 ENCOUNTER — Ambulatory Visit: Payer: Self-pay

## 2018-06-10 ENCOUNTER — Ambulatory Visit: Payer: Medicare Other | Admitting: Pharmacist

## 2018-06-10 DIAGNOSIS — Z794 Long term (current) use of insulin: Secondary | ICD-10-CM | POA: Diagnosis not present

## 2018-06-10 DIAGNOSIS — E1122 Type 2 diabetes mellitus with diabetic chronic kidney disease: Secondary | ICD-10-CM

## 2018-06-10 DIAGNOSIS — N184 Chronic kidney disease, stage 4 (severe): Secondary | ICD-10-CM | POA: Diagnosis not present

## 2018-06-10 DIAGNOSIS — I129 Hypertensive chronic kidney disease with stage 1 through stage 4 chronic kidney disease, or unspecified chronic kidney disease: Secondary | ICD-10-CM

## 2018-06-10 NOTE — Progress Notes (Signed)
  Chronic Care Management   Outreach Note  06/10/2018 Name: Justin Holloway MRN: [id number] DOB: October 08, 1929  Referred by: [name], MD Reason for referral : Chronic Care Management (CCM RNCM Telephone Follow Up )   An unsuccessful telephone outreach was attempted today. The patient was referred to the case management team by [name] MD for assistance with chronic care management and care coordination.     Follow Up Plan: The care management team will reach out to the patient again over the next 7-10 days.      Barb Merino, RN,CCM Care Management Coordinator [place] [facility]  Direct Phone: [phone]

## 2018-06-11 ENCOUNTER — Telehealth: Payer: Self-pay

## 2018-06-11 LAB — CMP14+EGFR
ALT: 12 IU/L (ref 0–44)
AST: 21 IU/L (ref 0–40)
Albumin/Globulin Ratio: 1.5 (ref 1.2–2.2)
Albumin: 4.1 g/dL (ref 3.6–4.6)
Alkaline Phosphatase: 106 IU/L (ref 39–117)
BUN/Creatinine Ratio: 16 (ref 10–24)
BUN: 43 mg/dL — ABNORMAL HIGH (ref 8–27)
Bilirubin Total: 0.3 mg/dL (ref 0.0–1.2)
CO2: 22 mmol/L (ref 20–29)
Calcium: 9.7 mg/dL (ref 8.6–10.2)
Chloride: 99 mmol/L (ref 96–106)
Creatinine, Ser: 2.77 mg/dL — ABNORMAL HIGH (ref 0.76–1.27)
GFR calc Af Amer: 23 mL/min/{1.73_m2} — ABNORMAL LOW (ref >59)
GFR calc non Af Amer: 20 mL/min/{1.73_m2} — ABNORMAL LOW (ref >59)
Globulin, Total: 2.7 g/dL (ref 1.5–4.5)
Glucose: 118 mg/dL — ABNORMAL HIGH (ref 65–99)
Potassium: 4.4 mmol/L (ref 3.5–5.2)
Sodium: 138 mmol/L (ref 134–144)
Total Protein: 6.8 g/dL (ref 6.0–8.5)

## 2018-06-11 LAB — LIPID PANEL
Chol/HDL Ratio: 3 ratio (ref 0.0–5.0)
Cholesterol, Total: 161 mg/dL (ref 100–199)
HDL: 54 mg/dL (ref >39)
LDL Calculated: 94 mg/dL (ref 0–99)
Triglycerides: 66 mg/dL (ref 0–149)
VLDL Cholesterol Cal: 13 mg/dL (ref 5–40)

## 2018-06-11 LAB — HEMOGLOBIN A1C
Est. average glucose Bld gHb Est-mCnc: 217 mg/dL
Hgb A1c MFr Bld: 9.2 % — ABNORMAL HIGH (ref 4.8–5.6)

## 2018-06-11 NOTE — Progress Notes (Signed)
Chronic Care Management   Visit Note  06/10/2018 Name: Justin Holloway MRN: [id number] DOB: March 03, 1929  Referred by: [name], MD Reason for referral : Chronic Care Management   Justin Holloway is a 85 y.o. year old male who is a primary care patient of [name], MD. The CCM team was consulted for assistance with chronic disease management and care coordination needs.   Review of patient status, including review of consultants reports, relevant laboratory and other test results, and collaboration with appropriate care team members and the patient's provider was performed as part of comprehensive patient evaluation and provision of chronic care management services.    I spoke with Justin Holloway by telephone today.  Objective:   Goals Addressed            This Visit's Progress     Patient Stated   . "I can't afford my diabetic medicines" (pt-stated)       Current Barriers:  [name] Knowledge Deficits related to resources to assist with cost of diabetic medications . Lacks caregiver support.   Nurse Case Manager Clinical Goal(s):  [name] Over the next 30 days, patient will work with CCM team to address needs related to cost of Ozempic and [place].  Interventions:   Completed CCM PharmD telephone outreach with patient  Evaluation of current treatment plan related to pharmacological treatment for Diabetes and patient's adherence to plan as established by provider . Reviewed medications with patient-->updated medication list per patient report & dispense history . Discussed plans with patient for ongoing care management follow up and provided patient with direct contact information for care management team . Collaboration with [facility] CPhT, Etter Sjogren for patient assistance programs for [employer] through [employer] (provides free pen needles as well).  Patient qualifies based on initial screening. . Patient has mailed back applications/provider portion received. .  Scheduled a CCM follow up call with patient for about 2 weeks   Patient Self Care Activities:   Verbalizes understanding of the education/information provided today . Self administers medications as prescribed . Attends all scheduled provider appointments . Calls pharmacy for medication refills . Performs ADL's independently . Performs IADL's independently . Calls provider office for new concerns or questions  Please see past updates related to this goal by clicking on the "Past Updates" button in the selected goal       . COMPLETED: I need a glucometer. (pt-stated)       Current Barriers:  . Financial Barriers-patient is using relatives glucometer and is worried about cost  Pharmacist Clinical Goal(s):  [name] Over the next 14 days, patient will work with CCM team to address needs related to obtaining glucometer  Interventions: . Comprehensive medication review performed. . Collaboration with provider re: medication management . Collaboration with RN Care Manager re: diabetes/regimen  . Request sent to CMA/provider for new glucometer--Confirmed glucometer is filled at [facility] at $0 copay.  Patient states he will pick up this week. . Patient states his FBG have been 80-85.  Encouraged him to keep up the great work.  He injects his Ozempic on Fridays and is comfortable with his reigmen. . DM patient stable on ASA/statin for prevention.  Patient Self Care Activities:  . Self administers medications as prescribed . Attends all scheduled provider appointments . Calls pharmacy for medication refills  Please see past updates related to this goal by clicking on the "Past Updates" button in the selected goal      . I would like  to continue managing my diabetes and get my A1c <7 (pt-stated)       Current Barriers:  . Non Adherence to prescribed medication regimen . Financial Barriers  Pharmacist Clinical Goal(s):  [name] Over the next 30  days, patient will work with PharmD, RN & PCP  to  address needs related to optimized medication management of chronic conditions  Interventions: . Comprehensive medication review performed. . Advised patient to continue taking medications as prescribed . Patient remains on Ozempic.  He states his constipation has improve with initiation of OTC laxatives.  He reports compliance.  He continues to inject Ozempic on Fridays. . Encouraged him to keep up the great work.  . DM patient stable on ASA/statin for prevention. [name] Collaboration with provider re: medication management  Patient Self Care Activities:  . Self administers medications as prescribed . Attends all scheduled provider appointments . Calls pharmacy for medication refills  Initial goal documentation        Plan:   The care management team will reach out to the patient again over the next 3 weeks.  [name], PharmD, BCPS Clinical Pharmacist, [facility] [place]: [phone]

## 2018-06-11 NOTE — Telephone Encounter (Signed)
The pt returned a call to the office and I told him it was Angle Little with the Chronic care management team.. He was given her contact information and he said that he would call her back later.

## 2018-06-11 NOTE — Patient Instructions (Signed)
Visit Information  Goals Addressed            This Visit's Progress     Patient Stated   . "I can't afford my diabetic medicines" (pt-stated)       Current Barriers:  [name] Knowledge Deficits related to resources to assist with cost of diabetic medications . Lacks caregiver support.   Nurse Case Manager Clinical Goal(s):  [name] Over the next 30 days, patient will work with CCM team to address needs related to cost of Ozempic and [place].  Interventions:   Completed CCM PharmD telephone outreach with patient  Evaluation of current treatment plan related to pharmacological treatment for Diabetes and patient's adherence to plan as established by provider . Reviewed medications with patient-->updated medication list per patient report & dispense history . Discussed plans with patient for ongoing care management follow up and provided patient with direct contact information for care management team . Collaboration with [facility] CPhT, Etter Sjogren for patient assistance programs for [employer] through [employer] (provides free pen needles as well).  Patient qualifies based on initial screening. . Patient has mailed back applications/provider portion received. . Scheduled a CCM follow up call with patient for about 2 weeks   Patient Self Care Activities:   Verbalizes understanding of the education/information provided today . Self administers medications as prescribed . Attends all scheduled provider appointments . Calls pharmacy for medication refills . Performs ADL's independently . Performs IADL's independently . Calls provider office for new concerns or questions  Please see past updates related to this goal by clicking on the "Past Updates" button in the selected goal       . COMPLETED: I need a glucometer. (pt-stated)       Current Barriers:  . Financial Barriers-patient is using relatives glucometer and is worried about cost  Pharmacist Clinical Goal(s):  [name] Over the next  14 days, patient will work with CCM team to address needs related to obtaining glucometer  Interventions: . Comprehensive medication review performed. . Collaboration with provider re: medication management . Collaboration with RN Care Manager re: diabetes/regimen  . Request sent to CMA/provider for new glucometer--Confirmed glucometer is filled at [facility] at $0 copay.  Patient states he will pick up this week. . Patient states his FBG have been 80-85.  Encouraged him to keep up the great work.  He injects his Ozempic on Fridays and is comfortable with his reigmen. . DM patient stable on ASA/statin for prevention.  Patient Self Care Activities:  . Self administers medications as prescribed . Attends all scheduled provider appointments . Calls pharmacy for medication refills  Please see past updates related to this goal by clicking on the "Past Updates" button in the selected goal      . I would like to continue managing my diabetes and get my A1c <7 (pt-stated)       Current Barriers:  . Non Adherence to prescribed medication regimen . Financial Barriers  Pharmacist Clinical Goal(s):  [name] Over the next 30  days, patient will work with PharmD, RN & PCP  to address needs related to optimized medication management of chronic conditions  Interventions: . Comprehensive medication review performed. . Advised patient to continue taking medications as prescribed . Patient remains on Ozempic.  He states his constipation has improve with initiation of OTC laxatives.  He reports compliance.  He continues to inject Ozempic on Fridays. . Encouraged him to keep up the great work.  . DM patient stable on  ASA/statin for prevention. [name] Collaboration with provider re: medication management  Patient Self Care Activities:  . Self administers medications as prescribed . Attends all scheduled provider appointments . Calls pharmacy for medication refills  Initial goal documentation        The  patient verbalized understanding of instructions provided today and declined a print copy of patient instruction materials.   The care management team will reach out to the patient again over the next 3 weeks.  [name], PharmD, BCPS Clinical Pharmacist, [facility] [place]: [phone]

## 2018-06-19 ENCOUNTER — Telehealth: Payer: Self-pay

## 2018-06-24 ENCOUNTER — Telehealth: Payer: Self-pay

## 2018-06-24 NOTE — Telephone Encounter (Signed)
Returned call to pt to let him know he could pick up his sample

## 2018-06-28 ENCOUNTER — Ambulatory Visit: Payer: Self-pay | Admitting: Pharmacist

## 2018-06-28 ENCOUNTER — Ambulatory Visit (INDEPENDENT_AMBULATORY_CARE_PROVIDER_SITE_OTHER): Payer: Medicare Other

## 2018-06-28 DIAGNOSIS — N184 Chronic kidney disease, stage 4 (severe): Secondary | ICD-10-CM

## 2018-06-28 DIAGNOSIS — I129 Hypertensive chronic kidney disease with stage 1 through stage 4 chronic kidney disease, or unspecified chronic kidney disease: Secondary | ICD-10-CM

## 2018-06-28 DIAGNOSIS — E1122 Type 2 diabetes mellitus with diabetic chronic kidney disease: Secondary | ICD-10-CM | POA: Diagnosis not present

## 2018-06-28 DIAGNOSIS — Z794 Long term (current) use of insulin: Secondary | ICD-10-CM

## 2018-06-28 NOTE — Chronic Care Management (AMB) (Signed)
Chronic Care Management   Follow Up Note   06/28/2018 Name: Justin Holloway MRN: [id number] DOB: Nov 09, 1929  Referred by: Justin Chard, MD Reason for referral : Chronic Care Management (CCM RNCM Inbound Call from patient )   Justin Holloway is a 85 y.o. year old male who is a primary care patient of Justin Chard, MD. The CCM team was consulted for assistance with chronic disease management and care coordination needs.    Review of patient status, including review of consultants reports, relevant laboratory and other test results, and collaboration with appropriate care team members and the patient's provider was performed as part of comprehensive patient evaluation and provision of chronic care management services.    I spoke with Justin Holloway by telephone today.   Goals Addressed      Patient Stated   . "I have poor balance" (pt-stated)       Current Barriers:  Justin Holloway Knowledge Deficits related to treatment management for impaired physical mobility . Diabetic Neuropathy . Peripheral Vascular Disease  Nurse Case Manager Clinical Goal(s):  Justin Holloway Over the next 90 days, patient will verbalize understanding of plan for in home PT evaluation and treatment for poor balance, strengthening and stamina. 06/28/18 Goal date re-established to 90 days due to COVID-19 treatment delays.   CCM RN CM Interventions:  06/28/18 Completed call with patient   . Evaluation of current treatment plan related to poor gait/balance, PVD and Neuropathy and patient's adherence to plan as established by provider - discussed Dr. Baird Cancer has not sent a referral for in home PT at this time . Assessed for falls, DME needs and or home safety concerns - patient denies, he continues to use his cane at all times and reports he continues to have difficulty with balance and gait . In basket message sent to Dr. Baird Cancer regarding referral for in home PT evaluation and treatment  . Encouraged patient to take his time when  changing positions and to avoid bending over when possible; discussed the importance of patient using his cane at all times to help avoid falls - pt verbalizes understanding and is agreeable  . Discussed plans with patient for ongoing care management follow up and provided patient with direct contact information for care management team . Discussed upcoming OV with Dr. Baird Cancer scheduled for 08/29/18 @10 :15 AM   Patient Self Care Activities:  . Self administers medications as prescribed . Attends all scheduled provider appointments . Calls pharmacy for medication refills . Performs ADL's independently . Performs IADL's independently . Calls provider office for new concerns or questions  Please see past updates related to this goal by clicking on the "Past Updates" button in the selected goal     . "I know my kidney function is not good" (pt-stated)       Current Barriers:  Justin Holloway Knowledge Deficits related to disease process and treatment mangement for renal insufficiency/CKD  Nurse Case Manager Clinical Goal(s):  Justin Holloway Over the next 60 days, patient will verbalize basic understanding of chronic kidney disease process and self health management plan as evidenced by patient will be able to verbalize what CKD is and how it is best management to help slow down progression and or ESRD.  CCM RN CM Interventions:  06/28/18: Completed call with patient   . Evaluation of current treatment plan related to stage 4 CKD and patient's adherence to plan as established by provider. . Provided education to patient re: recent CMP results that show persistent decreased renal function .  Discussed plans with patient for ongoing care management follow up and provided patient with direct contact information for care management team . Provided patient with printed educational materials related to CKD  basic disease process and treatment management   Patient Self Care Activities:  . Self administers medications as  prescribed . Attends all scheduled provider appointments . Calls pharmacy for medication refills . Attends church or other social activities . Performs ADL's independently . Performs IADL's independently . Calls provider office for new concerns or questions  Initial goal documentation     . "I need to get my A1C under 7" (pt-stated)       Current Barriers:  Justin Holloway Knowledge Deficits related to disease process and Self Health management of Diabetes  Nurse Case Manager Clinical Goal(s):  Justin Holloway Over the next 90 days, patient will verbalize basic understanding of Diabetes Mellitus, type 2 disease process and self health management plan as evidenced by patient will understand how to meal plan with low carbs and use portion control and the plate method for best dietary management of Diabetes. 06/28/18 re-established goal date to 90 days due to COVID-19 treatment delays.   CCM RN CM Interventions:  06/28/18 Completed call with patient   . Evaluation of adherence to current treatment plan related to Diabetes and patient's adherence to plan as established by provider . Discussed and reviewed recent A1C is up from 7.6 to 9.2; discussed target A1C <7.0 . Reinforced the importance of adhering to low carb diet, avoiding sugary foods and drinks, taking diabetic medications exactly as prescribed and implementing exercise as tolerated for best management of DM - pt verbalizes understanding and states, "I am working on doing better in all these areas" . Reinforced previous education provided and rationale to check cbg every am before breakfast and record, calling CM RN for findings outside established parameters . Resent printed materials to review for Diabetic meal planning, sls of hypo/hyperglycemia and knowing your A1C . Confirmed patient is working with embedded Pharm D [name] to help with cost of Ozempic and Tyler Aas; patient would like a call back from [place] to discuss his medication application sent for  financial assistance  . Discussed plans with patient for ongoing care management follow up and provided patient with direct contact information for care management team  Patient Self Care Activities:  . Self administers medications as prescribed . Attends all scheduled provider appointments . Calls pharmacy for medication refills . Performs ADL's independently . Performs IADL's independently . Calls provider office for new concerns or questions  Please see past updates related to this goal by clicking on the "Past Updates" button in the selected goal         CCM RNCM will follow up with Mr. Pacer again in 7-14 days to review patient ed mats.    Barb Merino, RN, BSN, CCM Care Management Coordinator [place] [facility]  Direct Phone: [phone]

## 2018-06-28 NOTE — Patient Instructions (Signed)
Visit Information  Goals Addressed      Patient Stated   . "I have poor balance" (pt-stated)       Current Barriers:  [name] Knowledge Deficits related to treatment management for impaired physical mobility . Diabetic Neuropathy . Peripheral Vascular Disease  Nurse Case Manager Clinical Goal(s):  [name] Over the next 90 days, patient will verbalize understanding of plan for in home PT evaluation and treatment for poor balance, strengthening and stamina. 06/28/18 Goal date re-established to 90 days due to COVID-19 treatment delays.   CCM RN CM Interventions:  06/28/18 Completed call with patient   . Evaluation of current treatment plan related to poor gait/balance, PVD and Neuropathy and patient's adherence to plan as established by provider - discussed Dr. Baird Cancer has not sent a referral for in home PT at this time . Assessed for falls, DME needs and or home safety concerns - patient denies, he continues to use his cane at all times and reports he continues to have difficulty with balance and gait . In basket message sent to Dr. Baird Cancer regarding referral for in home PT evaluation and treatment  . Encouraged patient to take his time when changing positions and to avoid bending over when possible; discussed the importance of patient using his cane at all times to help avoid falls - pt verbalizes understanding and is agreeable  . Discussed plans with patient for ongoing care management follow up and provided patient with direct contact information for care management team . Discussed upcoming OV with Dr. Baird Cancer scheduled for 08/29/18 @10 :15 AM   Patient Self Care Activities:  . Self administers medications as prescribed . Attends all scheduled provider appointments . Calls pharmacy for medication refills . Performs ADL's independently . Performs IADL's independently . Calls provider office for new concerns or questions  Please see past updates related to this goal by clicking on the "Past  Updates" button in the selected goal      . "I know my kidney function is not good" (pt-stated)       Current Barriers:  [name] Knowledge Deficits related to disease process and treatment mangement for renal insufficiency/CKD  Nurse Case Manager Clinical Goal(s):  [name] Over the next 60 days, patient will verbalize basic understanding of chronic kidney disease process and self health management plan as evidenced by patient will be able to verbalize what CKD is and how it is best management to help slow down progression and or ESRD.  CCM RN CM Interventions:  06/28/18 Completed call with patient  . Evaluation of current treatment plan related to stage 4 CKD and patient's adherence to plan as established by provider. . Provided education to patient re: recent CMP results that show persistent decreased renal function . Discussed plans with patient for ongoing care management follow up and provided patient with direct contact information for care management team . Provided patient with printed educational materials related to CKD  basic disease process and treatment management   Patient Self Care Activities:  . Self administers medications as prescribed . Attends all scheduled provider appointments . Calls pharmacy for medication refills . Attends church or other social activities . Performs ADL's independently . Performs IADL's independently . Calls provider office for new concerns or questions  Initial goal documentation     . "I need to get my A1C under 7" (pt-stated)       Current Barriers:  [name] Knowledge Deficits related to disease process and Self Health management of Diabetes  Nurse  Case Manager Clinical Goal(s):  [name] Over the next 90 days, patient will verbalize basic understanding of Diabetes Mellitus, type 2 disease process and self health management plan as evidenced by patient will understand how to meal plan with low carbs and use portion control and the plate method for best dietary  management of Diabetes. 06/28/18 re-established goal date to 90 days due to COVID-19 treatment delays.   CCM RN CM Interventions:  06/28/18 Completed call with patient   . Evaluation of adherence to current treatment plan related to Diabetes and patient's adherence to plan as established by provider . Discussed and reviewed recent A1C is up from 7.6 to 9.2; discussed target A1C <7.0 . Reinforced the importance of adhering to low carb diet, avoiding sugary foods and drinks, taking diabetic medications exactly as prescribed and implementing exercise as tolerated for best management of DM - pt verbalizes understanding and states, "I am working on doing better in all these areas" . Reinforced previous education provided and rationale to check cbg every am before breakfast and record, calling CM RN for findings outside established parameters . Resent printed materials to review for Diabetic meal planning, sls of hypo/hyperglycemia and knowing your A1C . Confirmed patient is working with embedded Pharm D [name] to help with cost of Ozempic and Tyler Aas; patient would like a call back from [place] to discuss his medication application sent for financial assistance  . Discussed plans with patient for ongoing care management follow up and provided patient with direct contact information for care management team  Patient Self Care Activities:  . Self administers medications as prescribed . Attends all scheduled provider appointments . Calls pharmacy for medication refills . Performs ADL's independently . Performs IADL's independently . Calls provider office for new concerns or questions  Please see past updates related to this goal by clicking on the "Past Updates" button in the selected goal        The patient verbalized understanding of instructions provided today and declined a print copy of patient instruction materials.   Telephone follow up appointment with care management team member  scheduled for: 07/05/18  Barb Merino, RN, BSN, CCM Care Management Coordinator [place] [facility]  Direct Phone: [phone]

## 2018-07-01 NOTE — Progress Notes (Signed)
  Chronic Care Management   Visit Note  06/28/2018 Name: Justin Holloway MRN: [id number] DOB: 1929/02/13  Referred by: [name], MD Reason for referral : Chronic Care Management   Justin Holloway is a 85 y.o. year old male who is a primary care patient of [name], MD. The CCM team was consulted for assistance with chronic disease management and care coordination needs.   Review of patient status, including review of consultants reports, relevant laboratory and other test results, and collaboration with appropriate care team members and the patient's provider was performed as part of comprehensive patient evaluation and provision of chronic care management services.    I spoke with Justin Holloway by telephone today.  Objective:   Goals Addressed            This Visit's Progress     Patient Stated   . "I can't afford my diabetic medicines" (pt-stated)       Current Barriers:  [name] Knowledge Deficits related to resources to assist with cost of diabetic medications . Lacks caregiver support.   Nurse Case Manager Clinical Goal(s):  [name] Over the next 30 days, patient will work with CCM team to address needs related to cost of Ozempic and [place].  Interventions:   Completed CCM PharmD telephone outreach with patient  Evaluation of current treatment plan related to pharmacological treatment for Diabetes and patient's adherence to plan as established by provider . Reviewed medications with patient-->updated medication list per patient report & dispense history . Discussed plans with patient for ongoing care management follow up and provided patient with direct contact information for care management team . Collaboration with [facility] CPhT, Etter Sjogren for patient assistance programs for [employer] through [employer] (provides free pen needles as well).  Patient qualifies based on initial screening.  Medications will be delivered to PCP office once approved & shipped.  . Patient has mailed back applications/provider portion received.  Mail has not been received from patient due to unknown reasons.  Applications prepared at PCP office for patient to sign on 07/01/18.  Will fax to company once completed. . Scheduled CCM follow up call with patient in 2 weeks   Patient Self Care Activities:   Verbalizes understanding of the education/information provided today . Self administers medications as prescribed . Attends all scheduled provider appointments . Calls pharmacy for medication refills . Performs ADL's independently . Performs IADL's independently . Calls provider office for new concerns or questions  Please see past updates related to this goal by clicking on the "Past Updates" button in the selected goal           Plan:   The care management team will reach out to the patient again over the next 2 weeks days.   [name], PharmD, BCPS Clinical Pharmacist, [facility] [place]: [phone]

## 2018-07-01 NOTE — Patient Instructions (Signed)
Visit Information  Goals Addressed            This Visit's Progress     Patient Stated   . "I can't afford my diabetic medicines" (pt-stated)       Current Barriers:  [name] Knowledge Deficits related to resources to assist with cost of diabetic medications . Lacks caregiver support.   Nurse Case Manager Clinical Goal(s):  [name] Over the next 30 days, patient will work with CCM team to address needs related to cost of Ozempic and [place].  Interventions:   Completed CCM PharmD telephone outreach with patient  Evaluation of current treatment plan related to pharmacological treatment for Diabetes and patient's adherence to plan as established by provider . Reviewed medications with patient-->updated medication list per patient report & dispense history . Discussed plans with patient for ongoing care management follow up and provided patient with direct contact information for care management team . Collaboration with [facility] CPhT, Etter Sjogren for patient assistance programs for [employer] through [employer] (provides free pen needles as well).  Patient qualifies based on initial screening.  Medications will be delivered to PCP office once approved & shipped. . Patient has mailed back applications/provider portion received.  Mail has not been received from patient due to unknown reasons.  Applications prepared at PCP office for patient to sign on 07/01/18.  Will fax to company once completed. . Scheduled CCM follow up call with patient in 2 weeks   Patient Self Care Activities:   Verbalizes understanding of the education/information provided today . Self administers medications as prescribed . Attends all scheduled provider appointments . Calls pharmacy for medication refills . Performs ADL's independently . Performs IADL's independently . Calls provider office for new concerns or questions  Please see past updates related to this goal by clicking on the "Past Updates" button in the  selected goal       . "I need to get my A1C under 7" (pt-stated)       Current Barriers:  [name] Knowledge Deficits related to disease process and Self Health management of Diabetes  Nurse Case Manager Clinical Goal(s):  [name] Over the next 90 days, patient will verbalize basic understanding of Diabetes Mellitus, type 2 disease process and self health management plan as evidenced by patient will understand how to meal plan with low carbs and use portion control and the plate method for best dietary management of Diabetes. 06/28/18 re-established goal date to 90 days due to COVID-19 treatment delays.   CCM RN CM Interventions:  06/28/18 Completed call with patient   . Evaluation of adherence to current treatment plan related to Diabetes and patient's adherence to plan as established by provider . Discussed and reviewed recent A1C is up from 7.6 to 9.2; discussed target A1C <7.0 . Reinforced the importance of adhering to low carb diet, avoiding sugary foods and drinks, taking diabetic medications exactly as prescribed and implementing exercise as tolerated for best management of DM - pt verbalizes understanding and states, "I am working on doing better in all these areas" . Reinforced previous education provided and rationale to check cbg every am before breakfast and record, calling CM RN for findings outside established parameters . Resent printed materials to review for Diabetic meal planning, sls of hypo/hyperglycemia and knowing your A1C . Confirmed patient is working with embedded Pharm D [name] to help with cost of Ozempic and Tyler Aas; patient would like a call back from [place] to discuss his medication application sent  for financial assistance  . Discussed plans with patient for ongoing care management follow up and provided patient with direct contact information for care management team  Patient Self Care Activities:  . Self administers medications as prescribed . Attends all scheduled  provider appointments . Calls pharmacy for medication refills . Performs ADL's independently . Performs IADL's independently . Calls provider office for new concerns or questions  Please see past updates related to this goal by clicking on the "Past Updates" button in the selected goal          The patient verbalized understanding of instructions provided today and declined a print copy of patient instruction materials.   The care management team will reach out to the patient again over the next 2 weeks.  [name], PharmD, BCPS Clinical Pharmacist, [facility] [place]: [phone]

## 2018-07-02 ENCOUNTER — Telehealth: Payer: Self-pay

## 2018-07-03 ENCOUNTER — Other Ambulatory Visit: Payer: Self-pay | Admitting: Pharmacy Technician

## 2018-07-03 NOTE — Patient Outreach (Signed)
[place] [facility]) Care Management  07/03/2018  DUANTE AROCHO 1929/05/03 [id number]   Received patient portion(s) of patient assistance application for [place] and Ozempic. Faxed completed application and required documents into [employer].  Will follow up with company in 5-7 business days to check status of application.  Maud Deed Chana Bode [place] Certified Pharmacy Technician [place] [facility] Direct [phone]

## 2018-07-05 ENCOUNTER — Other Ambulatory Visit: Payer: Self-pay | Admitting: Pharmacy Technician

## 2018-07-05 ENCOUNTER — Telehealth: Payer: Self-pay

## 2018-07-05 NOTE — Patient Outreach (Signed)
[place] [facility]) Care Management  07/05/2018  Justin Holloway Mar 06, 1929 [id number]    Follow up call placed to [employer] regarding patient assistance application(s) for Ozempic and Justin Mend confirms patient has been approved as of 6/26 until 12/09/18. Medication to arrive at office in 1-14 business days.  Follow up:  Will route note to [place] to inform.  Maud Deed Chana Bode [place] Certified Pharmacy Technician [place] [facility] Direct Dial:[phone]

## 2018-07-08 ENCOUNTER — Telehealth: Payer: Self-pay

## 2018-07-10 ENCOUNTER — Other Ambulatory Visit: Payer: Self-pay | Admitting: Internal Medicine

## 2018-07-11 ENCOUNTER — Other Ambulatory Visit: Payer: Self-pay | Admitting: Internal Medicine

## 2018-07-11 NOTE — Telephone Encounter (Signed)
Gabapentin refill

## 2018-07-15 DIAGNOSIS — H401134 Primary open-angle glaucoma, bilateral, indeterminate stage: Secondary | ICD-10-CM | POA: Diagnosis not present

## 2018-07-16 ENCOUNTER — Telehealth: Payer: Self-pay

## 2018-07-17 ENCOUNTER — Telehealth: Payer: Self-pay

## 2018-07-17 NOTE — Telephone Encounter (Signed)
The pt was notified that his Ozempic and Tyler Aas has been delivered from the pt assistance program.

## 2018-07-26 ENCOUNTER — Telehealth: Payer: Self-pay

## 2018-07-30 ENCOUNTER — Telehealth: Payer: Self-pay | Admitting: Internal Medicine

## 2018-07-30 ENCOUNTER — Other Ambulatory Visit: Payer: Self-pay | Admitting: Internal Medicine

## 2018-07-30 NOTE — Telephone Encounter (Signed)
I spoke with the patient, and he agreed to come in at 9:30 instead of 1:15 on 08/29/2018. VDM (DD)

## 2018-08-06 ENCOUNTER — Telehealth: Payer: Self-pay

## 2018-08-08 ENCOUNTER — Telehealth: Payer: Self-pay

## 2018-08-08 ENCOUNTER — Other Ambulatory Visit: Payer: Self-pay | Admitting: Nurse Practitioner

## 2018-08-13 ENCOUNTER — Other Ambulatory Visit: Payer: Self-pay | Admitting: Internal Medicine

## 2018-08-13 ENCOUNTER — Other Ambulatory Visit: Payer: Self-pay

## 2018-08-14 ENCOUNTER — Telehealth: Payer: Self-pay

## 2018-08-15 ENCOUNTER — Telehealth: Payer: Self-pay

## 2018-08-20 DIAGNOSIS — H401134 Primary open-angle glaucoma, bilateral, indeterminate stage: Secondary | ICD-10-CM | POA: Diagnosis not present

## 2018-08-21 ENCOUNTER — Ambulatory Visit (INDEPENDENT_AMBULATORY_CARE_PROVIDER_SITE_OTHER): Payer: Medicare Other | Admitting: Pharmacist

## 2018-08-21 DIAGNOSIS — N184 Chronic kidney disease, stage 4 (severe): Secondary | ICD-10-CM | POA: Diagnosis not present

## 2018-08-21 DIAGNOSIS — E1122 Type 2 diabetes mellitus with diabetic chronic kidney disease: Secondary | ICD-10-CM

## 2018-08-21 DIAGNOSIS — Z794 Long term (current) use of insulin: Secondary | ICD-10-CM

## 2018-08-21 DIAGNOSIS — I129 Hypertensive chronic kidney disease with stage 1 through stage 4 chronic kidney disease, or unspecified chronic kidney disease: Secondary | ICD-10-CM | POA: Diagnosis not present

## 2018-08-26 NOTE — Patient Instructions (Signed)
Visit Information  Goals Addressed            This Visit's Progress     Patient Stated   . COMPLETED: "I can't afford my diabetic medicines" (pt-stated)       Current Barriers:  [name] Knowledge Deficits related to resources to assist with cost of diabetic medications . Lacks caregiver support.   Nurse Case Manager Clinical Goal(s):  [name] Over the next 30 days, patient will work with CCM team to address needs related to cost of Ozempic and [place].  Interventions:   Completed CCM PharmD telephone outreach with patient  Evaluation of current treatment plan related to pharmacological treatment for Diabetes and patient's adherence to plan as established by provider . Reviewed medications with patient-->updated medication list per patient report & dispense history . Discussed plans with patient for ongoing care management follow up and provided patient with direct contact information for care management team . Collaboration with [facility] CPhT, Etter Sjogren for patient assistance programs for [employer] through [employer] (provides free pen needles as well).  Patient qualifies based on initial screening.  Medications will be delivered to PCP office once approved & shipped. . Patient assistance has been obtained.  Goal has been met   Patient Self Care Activities:   Verbalizes understanding of the education/information provided today . Self administers medications as prescribed . Attends all scheduled provider appointments . Calls pharmacy for medication refills . Performs ADL's independently . Performs IADL's independently . Calls provider office for new concerns or questions  Please see past updates related to this goal by clicking on the "Past Updates" button in the selected goal       . I would like to continue managing my diabetes and get my A1c <7 (pt-stated)       Current Barriers:  . Diabetes: T2DM; most recent A1c 9.2% on 06/10/2018 (repeat A1c scheduled for 08/29/18)   . Current antihyperglycemic regimen: Tresiba 25 units qHS, Ozempic once weekly sq on Fridays. One touch ultra glucometer . denies hypoglycemic symptoms; denies hyperglycemic symptoms . Current exercise: walking as able . Current blood glucose readings: patient states Bg<150 in the AM . Cardiovascular risk reduction: o Current hypertensive regimen: hydralazine TID o Current hyperlipidemia regimen: pravastatin 95m daily  Pharmacist Clinical Goal(s):  .Marland KitchenOver the next 90 days, patient with work with PharmD and primary care provider to address optimization of medication management of chronic conditions.  Interventions: . Comprehensive medication review performed, medication list updated in electronic medical record.  Reviewed medication fill history via insurance claims data confirming patient appears compliant with having his medications filled on time as prescribed by provider. . Reviewed & discussed the following diabetes-related information with patient: o Continue checking blood sugars as directed o Follow ADA recommended "diabetes-friendly" diet  (reviewed healthy snack/food options) o Discussed insulin/GLP-1 injection technique o Reviewed medication purpose/side effects-->patient denies adverse events, denies hypoglycemia, reports FBG<150, most recently in the upper 90s. o Continue taking all medications as prescribed by provider  Patient Self Care Activities:  . Patient will check blood glucose daily , document, and provide at future appointments . Patient will focus on medication adherence by taking insulin and GLP-1 as prescribed. . Patient will take medications as prescribed . Patient will contact provider with any episodes of hypoglycemia . Patient will report any questions or concerns to provider   Please see past updates related to this goal by clicking on the "Past Updates" button in the selected goal       .  My gabapentin is making me itch.  I would like to try a  different medication for my neuropathy. (pt-stated)       Current Barriers:  . Patient reports "itching" with gabapentin therapy.  He is currently taking gabapentin 233m qHS.  Pharmacist Clinical Goal(s):  .Marland KitchenOver the next 45 days, patient will work with PharmD and provider towards optimized medication management  Interventions: . Comprehensive medication review performed; medication list updated in electronic medical record . Discussed with patient and MD to work to find an alternative for patient's peripheral neuropathy.  Would avoid TCAs (amitriptyline) given patient's age and potential side effects.  Could consider topical agent (lidocaine/capsacin) or low dose Lyrica capsule.   . [name]  Patient Self Care Activities:  . Patient will take medications as prescribed . Patient open to an alternative for his peripheral neuropathy  Initial goal documentation        The patient verbalized understanding of instructions provided today and declined a print copy of patient instruction materials.   The care management team will reach out to the patient again over the next 4 weeks.  JRegina Eck PharmD, BCPS Clinical Pharmacist, [facility] [place] [phone]

## 2018-08-26 NOTE — Progress Notes (Addendum)
Chronic Care Management   Visit Note  08/21/2018 Name: Justin Holloway MRN: [id number] DOB: [birth year]  Referred by: Justin Chard, MD Reason for referral : Chronic Care Management   Justin Holloway is a 85 y.o. year old male who is a primary care patient of Justin Chard, MD. The CCM team was consulted for assistance with chronic disease management and care coordination needs.   Review of patient status, including review of consultants reports, relevant laboratory and other test results, and collaboration with appropriate care team members and the patient's provider was performed as part of comprehensive patient evaluation and provision of chronic care management services.    I spoke with Justin Holloway by telephone today.  Objective:   Goals Addressed            This Visit's Progress     Patient Stated   . COMPLETED: "I can't afford my diabetic medicines" (pt-stated)       Current Barriers:  Justin Holloway Knowledge Deficits related to resources to assist with cost of diabetic medications . Lacks caregiver support.   Nurse Case Manager Clinical Goal(s):  Justin Holloway Over the next 30 days, patient will work with CCM team to address needs related to cost of Ozempic and [place].  Interventions:   Completed CCM PharmD telephone outreach with patient  Evaluation of current treatment plan related to pharmacological treatment for Diabetes and patient's adherence to plan as established by provider . Reviewed medications with patient-->updated medication list per patient report & dispense history . Discussed plans with patient for ongoing care management follow up and provided patient with direct contact information for care management team . Collaboration with [facility] CPhT, Etter Sjogren for patient assistance programs for [employer] through [employer] (provides free pen needles as well).  Patient qualifies based on initial screening.  Medications will be delivered to PCP office once approved &  shipped. . Patient assistance has been obtained.  Goal has been met   Patient Self Care Activities:   Verbalizes understanding of the education/information provided today . Self administers medications as prescribed . Attends all scheduled provider appointments . Calls pharmacy for medication refills . Performs ADL's independently . Performs IADL's independently . Calls provider office for new concerns or questions  Please see past updates related to this goal by clicking on the "Past Updates" button in the selected goal       . I would like to continue managing my diabetes and get my A1c <7 (pt-stated)       Current Barriers:  . Diabetes: T2DM; most recent A1c 9.2% on 06/10/2018 (repeat A1c scheduled for 08/29/18)  . Current antihyperglycemic regimen: Tresiba 25 units qHS, Ozempic once weekly sq on Fridays. One touch ultra glucometer . denies hypoglycemic symptoms; denies hyperglycemic symptoms . Current exercise: walking as able . Current blood glucose readings: patient states Bg<150 in the AM . Cardiovascular risk reduction: o Current hypertensive regimen: hydralazine TID o Current hyperlipidemia regimen: pravastatin 64m daily  Pharmacist Clinical Goal(s):  .Justin KitchenOver the next 90 days, patient with work with PharmD and primary care provider to address optimization of medication management of chronic conditions.  Interventions: . Comprehensive medication review performed, medication list updated in electronic medical record.  Reviewed medication fill history via insurance claims data confirming patient appears compliant with having his medications filled on time as prescribed by provider. . Reviewed & discussed the following diabetes-related information with patient: o Continue checking blood sugars as directed o Follow ADA recommended "diabetes-friendly" diet  (  reviewed healthy snack/food options) o Discussed insulin/GLP-1 injection technique o Reviewed medication purpose/side  effects-->patient denies adverse events, denies hypoglycemia, reports FBG<150, most recently in the upper 90s. o Continue taking all medications as prescribed by provider  Patient Self Care Activities:  . Patient will check blood glucose daily , document, and provide at future appointments . Patient will focus on medication adherence by taking insulin and GLP-1 as prescribed. . Patient will take medications as prescribed . Patient will contact provider with any episodes of hypoglycemia . Patient will report any questions or concerns to provider   Please see past updates related to this goal by clicking on the "Past Updates" button in the selected goal       . My gabapentin is making me itch.  I would like to try a different medication for my neuropathy. (pt-stated)       Current Barriers:  . Patient reports "itching" with gabapentin therapy.  He is currently taking gabapentin 200mg qHS.  Pharmacist Clinical Goal(s):  . Over the next 45 days, patient will work with PharmD and provider towards optimized medication management  Interventions: . Comprehensive medication review performed; medication list updated in electronic medical record . Discussed with patient and MD to work to find an alternative for patient's peripheral neuropathy.  Would avoid TCAs (amitriptyline) given patient's age and potential side effects.  Could consider increase dose if this is a symptom of uncontrolled neuropathy.  Patient states he has not "gotten relief" from taking gabapentin.  Could consider topical agent (lidocaine/capsacin) or low dose Lyrica capsule as therapeutic alternatives. . [name]  Patient Self Care Activities:  . Patient will take medications as prescribed . Patient open to an alternative for his peripheral neuropathy  Initial goal documentation         Plan:   The care management team will reach out to the patient again over the next 4 weeks.   Justin Holloway,  PharmD, BCPS Clinical Pharmacist, [facility] [place]  [facility]  Direct Dial: [phone]     

## 2018-08-29 ENCOUNTER — Encounter: Payer: Self-pay | Admitting: Internal Medicine

## 2018-08-29 ENCOUNTER — Other Ambulatory Visit: Payer: Self-pay

## 2018-08-29 ENCOUNTER — Ambulatory Visit (INDEPENDENT_AMBULATORY_CARE_PROVIDER_SITE_OTHER): Payer: Medicare Other

## 2018-08-29 ENCOUNTER — Ambulatory Visit (INDEPENDENT_AMBULATORY_CARE_PROVIDER_SITE_OTHER): Payer: Medicare Other | Admitting: Internal Medicine

## 2018-08-29 VITALS — BP 130/62 | HR 95 | Temp 98.1°F | Ht 66.4 in | Wt 226.0 lb

## 2018-08-29 DIAGNOSIS — N184 Chronic kidney disease, stage 4 (severe): Secondary | ICD-10-CM

## 2018-08-29 DIAGNOSIS — Z794 Long term (current) use of insulin: Secondary | ICD-10-CM | POA: Diagnosis not present

## 2018-08-29 DIAGNOSIS — R2689 Other abnormalities of gait and mobility: Secondary | ICD-10-CM | POA: Diagnosis not present

## 2018-08-29 DIAGNOSIS — E119 Type 2 diabetes mellitus without complications: Secondary | ICD-10-CM | POA: Diagnosis not present

## 2018-08-29 DIAGNOSIS — E1122 Type 2 diabetes mellitus with diabetic chronic kidney disease: Secondary | ICD-10-CM | POA: Diagnosis not present

## 2018-08-29 DIAGNOSIS — Z23 Encounter for immunization: Secondary | ICD-10-CM

## 2018-08-29 DIAGNOSIS — Z6836 Body mass index (BMI) 36.0-36.9, adult: Secondary | ICD-10-CM

## 2018-08-29 DIAGNOSIS — Z0000 Encounter for general adult medical examination without abnormal findings: Secondary | ICD-10-CM

## 2018-08-29 DIAGNOSIS — R42 Dizziness and giddiness: Secondary | ICD-10-CM

## 2018-08-29 DIAGNOSIS — I129 Hypertensive chronic kidney disease with stage 1 through stage 4 chronic kidney disease, or unspecified chronic kidney disease: Secondary | ICD-10-CM

## 2018-08-29 DIAGNOSIS — R269 Unspecified abnormalities of gait and mobility: Secondary | ICD-10-CM | POA: Diagnosis not present

## 2018-08-29 LAB — BMP8+EGFR
BUN/Creatinine Ratio: 18 (ref 10–24)
BUN: 56 mg/dL — ABNORMAL HIGH (ref 8–27)
CO2: 21 mmol/L (ref 20–29)
Calcium: 9.7 mg/dL (ref 8.6–10.2)
Chloride: 93 mmol/L — ABNORMAL LOW (ref 96–106)
Creatinine, Ser: 3.17 mg/dL — ABNORMAL HIGH (ref 0.76–1.27)
GFR calc Af Amer: 19 mL/min/{1.73_m2} — ABNORMAL LOW (ref >59)
GFR calc non Af Amer: 17 mL/min/{1.73_m2} — ABNORMAL LOW (ref >59)
Glucose: 192 mg/dL — ABNORMAL HIGH (ref 65–99)
Potassium: 4.2 mmol/L (ref 3.5–5.2)
Sodium: 134 mmol/L (ref 134–144)

## 2018-08-29 LAB — HEMOGLOBIN A1C
Est. average glucose Bld gHb Est-mCnc: 180 mg/dL
Hgb A1c MFr Bld: 7.9 % — ABNORMAL HIGH (ref 4.8–5.6)

## 2018-08-29 NOTE — Progress Notes (Signed)
Subjective:   Justin Holloway is a 85 y.o. male who presents for Medicare Annual/Subsequent preventive examination.  Review of Systems:  n/a Cardiac Risk Factors include: advanced age (>35mn, >>78women);diabetes mellitus;hypertension;male gender;obesity (BMI >30kg/m2)     Objective:    Vitals: BP 130/62 (BP Location: Left Arm, Patient Position: Sitting, Cuff Size: Normal)   Pulse 95   Temp 98.1 F (36.7 C) (Oral)   Ht 5' 6.4" (1.687 m)   Wt 226 lb (102.5 kg)   BMI 36.04 kg/m   Body mass index is 36.04 kg/m.  Advanced Directives 08/29/2018 04/17/2018 12/26/2017 09/09/2017 09/08/2017 01/06/2017 03/29/2016  Does Patient Have a Medical Advance Directive? No No No No No No Yes  Type of Advance Directive - - - - - - Living will;Healthcare Power of Attorney  Does patient want to make changes to medical advance directive? - - - - - - No - Patient declined  Copy of [place]? - - - - - - Yes  Would patient like information on creating a medical advance directive? - No - Patient declined No - Patient declined No - Patient declined No - Patient declined No - Patient declined -    Tobacco Social History   Tobacco Use  Smoking Status Former Smoker  . Types: Cigars  Smokeless Tobacco Never Used     Counseling given: Not Answered   Clinical Intake:  Pre-visit preparation completed: Yes  Pain : No/denies pain     Nutritional Status: BMI > 30  Obese Nutritional Risks: None Diabetes: Yes CBG done?: No Did pt. bring in CBG monitor from home?: No  How often do you need to have someone help you when you read instructions, pamphlets, or other written materials from your doctor or pharmacy?: 1 - Never What is the last grade level you completed in school?: 11th grade  Interpreter Needed?: No  Information entered by :: NAllen LPN  Past Medical History:  Diagnosis Date  . Arthritis   . CKD (chronic kidney disease), stage III ([facility]   . Diabetes  mellitus without complication ([facility]   . Glaucoma    "blurred vision", see Dr SGershon Craneyearly  . Hyperlipidemia   . Hypertension   . TIA (transient ischemic attack)    Past Surgical History:  Procedure Laterality Date  . INGUINAL HERNIA REPAIR Left 06/09/2013   Procedure: OPEN REPAIR LEFT INGUINAL HERNIA;  Surgeon: HAdin Hector MD;  [facility]  Service: General;  Laterality: Left;  . INSERTION OF MESH Left 06/09/2013   Procedure: INSERTION OF MESH;  Surgeon: HAdin Hector MD;  [facility]  Service: General;  Laterality: Left;   Family History  Problem Relation Age of Onset  . Cancer Mother 480      breast  . COPD Father 840      Congestive heart failure   Social History   Socioeconomic History  . Marital status: Married    Spouse name: SKatharine Look . Number of children: 8  . Years of education: 171 . Highest education level: Not on file  Occupational History  . Occupation: retired  SScientific laboratory technician . Financial resource strain: Not hard at all  . Food insecurity    Worry: Never true    Inability: Never true  . Transportation needs    Medical: No    Non-medical: No  Tobacco Use  . Smoking status: Former Smoker    Types: Cigars  .  Smokeless tobacco: Never Used  Substance and Sexual Activity  . Alcohol use: Never    Frequency: Never  . Drug use: No  . Sexual activity: Not Currently  Lifestyle  . Physical activity    Days per week: 7 days    Minutes per session: 10 min  . Stress: Not at all  Relationships  . Social connections    Talks on phone: More than three times a week    Gets together: More than three times a week    Attends religious service: More than 4 times per year    Active member of club or organization: Yes    Attends meetings of clubs or organizations: More than 4 times per year    Relationship status: Married  Other Topics Concern  . Not on file  Social History Narrative   Lives with wife at home    caffeine- coffee 1 cup daily     Outpatient Encounter Medications as of 08/29/2018  Medication Sig  . aspirin EC 81 MG tablet Take 81 mg by mouth every evening.  . Blood Glucose Monitoring Suppl (ONE TOUCH ULTRA 2) w/Device KIT Use as directed to check blood sugars 2 times per day dx: e11.22  . cholecalciferol (VITAMIN D) 1000 units tablet Take 1,000 Units by mouth 2 (two) times daily.  . Easy Comfort Lancets MISC USE LANCETS TO TEST BLOOD SUGAR 3 TIME(S) DAILY , ONE LANCET PER TEST  . furosemide (LASIX) 80 MG tablet Take by mouth. Take 38m in the AM and 446min the early evening.  . gabapentin (NEURONTIN) 100 MG capsule TAKE 2 CAPSULES BY MOUTH EVERY NIGHT  . hydrALAZINE (APRESOLINE) 25 MG tablet TAKE 1 TABLET BY MOUTH THREE TIMES DAILY  . Insulin Degludec (TRESIBA FLEXTOUCH) 200 UNIT/ML SOPN Inject 25 Units into the skin at bedtime.  . Marland Kitchenetorolac (ACULAR) 0.4 % SOLN INT 1 GTT IN OU QID  . LUMIGAN 0.01 % SOLN   . Multiple Vitamin (MULTIVITAMIN WITH MINERALS) TABS tablet Take 1 tablet by mouth daily.  . Glory RosebushLTRA test strip USE STRIPS TO TEST BLOOD SUGAR 3 TIME(S) DAILY, ONE STRIP PER TEST  . pravastatin (PRAVACHOL) 40 MG tablet TAKE 1 TABLET BY MOUTH EVERY DAY  . Semaglutide (OZEMPIC) 0.25 or 0.5 MG/DOSE SOPN Inject 0.5 mg into the skin once a week.  . meclizine (ANTIVERT) 12.5 MG tablet TAKE 1 TABLET BY MOUTH THREE TIMES DAILY AS NEEDED FOR DIZZINESS (Patient not taking: Reported on 08/29/2018)   No facility-administered encounter medications on file as of 08/29/2018.     Activities of Daily Living In your present state of health, do you have any difficulty performing the following activities: 08/29/2018 12/26/2017  Hearing? N N  Vision? N Y  Comment - had lasik surgery a month ago  Difficulty concentrating or making decisions? N N  Walking or climbing stairs? Y N  Dressing or bathing? N N  Doing errands, shopping? N N  Preparing Food and eating ? N N  Using the Toilet? N N  In the past six months, have you  accidently leaked urine? Y N  Comment wears pads -  Do you have problems with loss of bowel control? N N  Managing your Medications? N N  Managing your Finances? N N  Housekeeping or managing your Housekeeping? N N  Some recent data might be hidden    Patient Care Team: SaGlendale ChardMD as PCP - General (Internal Medicine) WhMarylynn PearsonMD as Consulting Physician (Ophthalmology) HuHartrandt  Tillie Rung as Social Worker [place], [name], RN as Case Manager [place], Royce Macadamia, [facility] (Pharmacist)   Assessment:   This is a routine wellness examination for Morell.  Exercise Activities and Dietary recommendations Current Exercise Habits: Home exercise routine, Type of exercise: walking, Time (Minutes): 10, Frequency (Times/Week): 7, Weekly Exercise (Minutes/Week): 70  Goals    . "I have poor balance" (pt-stated)     Current Barriers:  [name] Knowledge Deficits related to treatment management for impaired physical mobility . Diabetic Neuropathy . Peripheral Vascular Disease  Nurse Case Manager Clinical Goal(s):  [name] Over the next 90 days, patient will verbalize understanding of plan for in home PT evaluation and treatment for poor balance, strengthening and stamina. 06/28/18 Goal date re-established to 90 days due to COVID-19 treatment delays.   CCM RN CM Interventions:  06/28/18 Completed call with patient   . Evaluation of current treatment plan related to poor gait/balance, PVD and Neuropathy and patient's adherence to plan as established by provider - discussed Dr. Baird Cancer has not sent a referral for in home PT at this time . Assessed for falls, DME needs and or home safety concerns - patient denies, he continues to use his cane at all times and reports he continues to have difficulty with balance and gait . In basket message sent to Dr. Baird Cancer regarding referral for in home PT evaluation and treatment  . Encouraged patient to take his time when changing positions and to avoid bending over when  possible; discussed the importance of patient using his cane at all times to help avoid falls - pt verbalizes understanding and is agreeable  . Discussed plans with patient for ongoing care management follow up and provided patient with direct contact information for care management team . Discussed upcoming OV with Dr. Baird Cancer scheduled for 08/29/18 @10 :15 AM    Patient Self Care Activities:  . Self administers medications as prescribed . Attends all scheduled provider appointments . Calls pharmacy for medication refills . Performs ADL's independently . Performs IADL's independently . Calls provider office for new concerns or questions  Please see past updates related to this goal by clicking on the "Past Updates" button in the selected goal       . "I know my kidney function is not good" (pt-stated)     Current Barriers:  [name] Knowledge Deficits related to disease process and treatment mangement for renal insufficiency/CKD  Nurse Case Manager Clinical Goal(s):  [name] Over the next 60 days, patient will verbalize basic understanding of chronic kidney disease process and self health management plan as evidenced by patient will be able to verbalize what CKD is and how it is best management to help slow down progression and or ESRD.  CCM RN CM Interventions:  06/28/18: Completed call with patient  . Evaluation of current treatment plan related to stage 4 CKD and patient's adherence to plan as established by provider. . Provided education to patient re: recent CMP results that show persistent decreased renal function . Discussed plans with patient for ongoing care management follow up and provided patient with direct contact information for care management team . Provided patient with printed educational materials related to CKD  basic disease process and treatment management   Patient Self Care Activities:  . Self administers medications as prescribed . Attends all scheduled provider  appointments . Calls pharmacy for medication refills . Attends church or other social activities . Performs ADL's independently . Performs IADL's independently . Calls provider office for new  concerns or questions  Initial goal documentation     . "I need to get my A1C under 7" (pt-stated)     Current Barriers:  [name] Knowledge Deficits related to disease process and Self Health management of Diabetes  Nurse Case Manager Clinical Goal(s):  [name] Over the next 90 days, patient will verbalize basic understanding of Diabetes Mellitus, type 2 disease process and self health management plan as evidenced by patient will understand how to meal plan with low carbs and use portion control and the plate method for best dietary management of Diabetes. 06/28/18 re-established goal date to 90 days due to COVID-19 treatment delays.   CCM RN CM Interventions:  06/28/18 Completed call with patient   . Evaluation of adherence to current treatment plan related to Diabetes and patient's adherence to plan as established by provider . Discussed and reviewed recent A1C is up from 7.6 to 9.2; discussed target A1C <7.0 . Reinforced the importance of adhering to low carb diet, avoiding sugary foods and drinks, taking diabetic medications exactly as prescribed and implementing exercise as tolerated for best management of DM - pt verbalizes understanding and states, "I am working on doing better in all these areas" . Reinforced previous education provided and rationale to check cbg every am before breakfast and record, calling CM RN for findings outside established parameters . Resent printed materials to review for Diabetic meal planning, sls of hypo/hyperglycemia and knowing your A1C . Confirmed patient is working with embedded Pharm D [name] to help with cost of Ozempic and Tyler Aas; patient would like a call back from [place] to discuss his medication application sent for financial assistance  . Discussed plans with  patient for ongoing care management follow up and provided patient with direct contact information for care management team  Patient Self Care Activities:  . Self administers medications as prescribed . Attends all scheduled provider appointments . Calls pharmacy for medication refills . Performs ADL's independently . Performs IADL's independently . Calls provider office for new concerns or questions  Please see past updates related to this goal by clicking on the "Past Updates" button in the selected goal       . HEMOGLOBIN A1C < 7.0 (pt-stated)    . I would like to continue managing my diabetes and get my A1c <7 (pt-stated)     Current Barriers:  . Diabetes: T2DM; most recent A1c 9.2% on 06/10/2018 (repeat A1c scheduled for 08/29/18)  . Current antihyperglycemic regimen: Tresiba 25 units qHS, Ozempic once weekly sq on Fridays. One touch ultra glucometer . denies hypoglycemic symptoms; denies hyperglycemic symptoms . Current exercise: walking as able . Current blood glucose readings: patient states Bg<150 in the AM . Cardiovascular risk reduction: o Current hypertensive regimen: hydralazine TID o Current hyperlipidemia regimen: pravastatin 67m daily  Pharmacist Clinical Goal(s):  .Marland KitchenOver the next 90 days, patient with work with PharmD and primary care provider to address optimization of medication management of chronic conditions.  Interventions: . Comprehensive medication review performed, medication list updated in electronic medical record.  Reviewed medication fill history via insurance claims data confirming patient appears compliant with having his medications filled on time as prescribed by provider. . Reviewed & discussed the following diabetes-related information with patient: o Continue checking blood sugars as directed o Follow ADA recommended "diabetes-friendly" diet  (reviewed healthy snack/food options) o Discussed insulin/GLP-1 injection technique o Reviewed medication  purpose/side effects-->patient denies adverse events, denies hypoglycemia, reports FBG<150, most recently in the upper 90s. o  Continue taking all medications as prescribed by provider  Patient Self Care Activities:  . Patient will check blood glucose daily , document, and provide at future appointments . Patient will focus on medication adherence by taking insulin and GLP-1 as prescribed. . Patient will take medications as prescribed . Patient will contact provider with any episodes of hypoglycemia . Patient will report any questions or concerns to provider   Please see past updates related to this goal by clicking on the "Past Updates" button in the selected goal       . My gabapentin is making me itch.  I would like to try a different medication for my neuropathy. (pt-stated)     Current Barriers:  . Patient reports "itching" with gabapentin therapy.  He is currently taking gabapentin 264m qHS.  Pharmacist Clinical Goal(s):  .Marland KitchenOver the next 45 days, patient will work with PharmD and provider towards optimized medication management  Interventions: . Comprehensive medication review performed; medication list updated in electronic medical record . Discussed with patient and MD to work to find an alternative for patient's peripheral neuropathy.  Would avoid TCAs (amitriptyline) given patient's age and potential side effects.  Could consider increase dose if this is a symptom of uncontrolled neuropathy.  Patient states he has not "gotten relief" from taking gabapentin.  Could consider topical agent (lidocaine/capsacin) or low dose Lyrica capsule as therapeutic alternatives. . [name]  Patient Self Care Activities:  . Patient will take medications as prescribed . Patient open to an alternative for his peripheral neuropathy  Initial goal documentation     . Patient Stated     08/29/2018, to eat healthy       Fall Risk Fall Risk  08/29/2018 05/28/2018 12/26/2017  12/26/2017 02/14/2017  Falls in the past year? 0 0 0 0 No  Risk for fall due to : Impaired balance/gait;Medication side effect - - - -  Follow up Falls evaluation completed;Education provided;Falls prevention discussed - - - -   Is the patient's home free of loose throw rugs in walkways, pet beds, electrical cords, etc?   yes      Grab bars in the bathroom? yes      Handrails on the stairs?  n/a      Adequate lighting?   yes  Timed Get Up and Go Performed: n/a  Depression Screen PHQ 2/9 Scores 08/29/2018 04/17/2018 12/26/2017 12/26/2017  PHQ - 2 Score 0 0 0 0  PHQ- 9 Score 3 - 0 -    Cognitive Function     6CIT Screen 08/29/2018 12/26/2017  What Year? 0 points 0 points  What month? 0 points 0 points  What time? 0 points 0 points  Count back from 20 4 points 0 points  Months in reverse 4 points 4 points  Repeat phrase 2 points 2 points  Total Score 10 6    Immunization History  Administered Date(s) Administered  . Influenza, High Dose Seasonal PF 11/21/2017  . Pneumococcal Polysaccharide-23 12/10/1998    Qualifies for Shingles Vaccine? yes  Screening Tests Health Maintenance  Topic Date Due  . FOOT EXAM  10/03/1939  . OPHTHALMOLOGY EXAM  10/03/1939  . INFLUENZA VACCINE  08/10/2018  . HEMOGLOBIN A1C  12/10/2018  . TETANUS/TDAP  11/12/2022  . PNA vac Low Risk Adult  Completed   Cancer Screenings: Lung: Low Dose CT Chest recommended if Age 83-80years, 30 pack-year currently smoking OR have quit w/in 15years. Patient does not qualify. Colorectal: not  required  Additional Screenings:  Hepatitis C Screening:n/a      Plan:    Patient wants to eat healthy.  I have personally reviewed and noted the following in the patient's chart:   . Medical and social history . Use of alcohol, tobacco or illicit drugs  . Current medications and supplements . Functional ability and status . Nutritional status . Physical activity . Advanced directives . List of other physicians  . Hospitalizations, surgeries, and ER visits in previous 12 months . Vitals . Screenings to include cognitive, depression, and falls . Referrals and appointments  In addition, I have reviewed and discussed with patient certain preventive protocols, quality metrics, and best practice recommendations. A written personalized care plan for preventive services as well as general preventive health recommendations were provided to patient.     [name], LPN  [id number]

## 2018-08-29 NOTE — Patient Instructions (Signed)
[name] , Thank you for taking time to come for your Medicare Wellness Visit. I appreciate your ongoing commitment to your health goals. Please review the following plan we discussed and let me know if I can assist you in the future.   Screening recommendations/referrals: Colonoscopy: not required Recommended yearly ophthalmology/optometry visit for glaucoma screening and checkup Recommended yearly dental visit for hygiene and checkup  Vaccinations: Influenza vaccine: today Pneumococcal vaccine: 10/2011 Tdap vaccine: 07/2009 Shingles vaccine: discussed    Advanced directives: Advance directive discussed with you today. Even though you declined this today please call our office should you change your mind and we can give you the proper paperwork for you to fill out.   Conditions/risks identified: obesity  Next appointment: 09/10/2019 at 9:30  Preventive Care 25 Years and Older, Male Preventive care refers to lifestyle choices and visits with your health care provider that can promote health and wellness. What does preventive care include?  A yearly physical exam. This is also called an annual well check.  Dental exams once or twice a year.  Routine eye exams. Ask your health care provider how often you should have your eyes checked.  Personal lifestyle choices, including:  Daily care of your teeth and gums.  Regular physical activity.  Eating a healthy diet.  Avoiding tobacco and drug use.  Limiting alcohol use.  Practicing safe sex.  Taking low doses of aspirin every day.  Taking vitamin and mineral supplements as recommended by your health care provider. What happens during an annual well check? The services and screenings done by your health care provider during your annual well check will depend on your age, overall health, lifestyle risk factors, and family history of disease. Counseling  Your health care provider may ask you questions about your:  Alcohol  use.  Tobacco use.  Drug use.  Emotional well-being.  Home and relationship well-being.  Sexual activity.  Eating habits.  History of falls.  Memory and ability to understand (cognition).  Work and work Statistician. Screening  You may have the following tests or measurements:  Height, weight, and BMI.  Blood pressure.  Lipid and cholesterol levels. These may be checked every 5 years, or more frequently if you are over 13 years old.  Skin check.  Lung cancer screening. You may have this screening every year starting at age 14 if you have a 30-pack-year history of smoking and currently smoke or have quit within the past 15 years.  Fecal occult blood test (FOBT) of the stool. You may have this test every year starting at age 70.  Flexible sigmoidoscopy or colonoscopy. You may have a sigmoidoscopy every 5 years or a colonoscopy every 10 years starting at age 49.  Prostate cancer screening. Recommendations will vary depending on your family history and other risks.  Hepatitis C blood test.  Hepatitis B blood test.  Sexually transmitted disease (STD) testing.  Diabetes screening. This is done by checking your blood sugar (glucose) after you have not eaten for a while (fasting). You may have this done every 1-3 years.  Abdominal aortic aneurysm (AAA) screening. You may need this if you are a current or former smoker.  Osteoporosis. You may be screened starting at age 106 if you are at high risk. Talk with your health care provider about your test results, treatment options, and if necessary, the need for more tests. Vaccines  Your health care provider may recommend certain vaccines, such as:  Influenza vaccine. This is recommended every  year.  Tetanus, diphtheria, and acellular pertussis (Tdap, Td) vaccine. You may need a Td booster every 10 years.  Zoster vaccine. You may need this after age 42.  Pneumococcal 13-valent conjugate (PCV13) vaccine. One dose is  recommended after age 19.  Pneumococcal polysaccharide (PPSV23) vaccine. One dose is recommended after age 72. Talk to your health care provider about which screenings and vaccines you need and how often you need them. This information is not intended to replace advice given to you by your health care provider. Make sure you discuss any questions you have with your health care provider. Document Released: 01/22/2015 Document Revised: 09/15/2015 Document Reviewed: 10/27/2014 Elsevier Interactive Patient Education  2017 [employer] Prevention in the Home Falls can cause injuries. They can happen to people of all ages. There are many things you can do to make your home safe and to help prevent falls. What can I do on the outside of my home?  Regularly fix the edges of walkways and driveways and fix any cracks.  Remove anything that might make you trip as you walk through a door, such as a raised step or threshold.  Trim any bushes or trees on the path to your home.  Use bright outdoor lighting.  Clear any walking paths of anything that might make someone trip, such as rocks or tools.  Regularly check to see if handrails are loose or broken. Make sure that both sides of any steps have handrails.  Any raised decks and porches should have guardrails on the edges.  Have any leaves, snow, or ice cleared regularly.  Use sand or salt on walking paths during winter.  Clean up any spills in your garage right away. This includes oil or grease spills. What can I do in the bathroom?  Use night lights.  Install grab bars by the toilet and in the tub and shower. Do not use towel bars as grab bars.  Use non-skid mats or decals in the tub or shower.  If you need to sit down in the shower, use a plastic, non-slip stool.  Keep the floor dry. Clean up any water that spills on the floor as soon as it happens.  Remove soap buildup in the tub or shower regularly.  Attach bath mats  securely with double-sided non-slip rug tape.  Do not have throw rugs and other things on the floor that can make you trip. What can I do in the bedroom?  Use night lights.  Make sure that you have a light by your bed that is easy to reach.  Do not use any sheets or blankets that are too big for your bed. They should not hang down onto the floor.  Have a firm chair that has side arms. You can use this for support while you get dressed.  Do not have throw rugs and other things on the floor that can make you trip. What can I do in the kitchen?  Clean up any spills right away.  Avoid walking on wet floors.  Keep items that you use a lot in easy-to-reach places.  If you need to reach something above you, use a strong step stool that has a grab bar.  Keep electrical cords out of the way.  Do not use floor polish or wax that makes floors slippery. If you must use wax, use non-skid floor wax.  Do not have throw rugs and other things on the floor that can make you trip. What can  I do with my stairs?  Do not leave any items on the stairs.  Make sure that there are handrails on both sides of the stairs and use them. Fix handrails that are broken or loose. Make sure that handrails are as long as the stairways.  Check any carpeting to make sure that it is firmly attached to the stairs. Fix any carpet that is loose or worn.  Avoid having throw rugs at the top or bottom of the stairs. If you do have throw rugs, attach them to the floor with carpet tape.  Make sure that you have a light switch at the top of the stairs and the bottom of the stairs. If you do not have them, ask someone to add them for you. What else can I do to help prevent falls?  Wear shoes that:  Do not have high heels.  Have rubber bottoms.  Are comfortable and fit you well.  Are closed at the toe. Do not wear sandals.  If you use a stepladder:  Make sure that it is fully opened. Do not climb a closed  stepladder.  Make sure that both sides of the stepladder are locked into place.  Ask someone to hold it for you, if possible.  Clearly mark and make sure that you can see:  Any grab bars or handrails.  First and last steps.  Where the edge of each step is.  Use tools that help you move around (mobility aids) if they are needed. These include:  Canes.  Walkers.  Scooters.  Crutches.  Turn on the lights when you go into a dark area. Replace any light bulbs as soon as they burn out.  Set up your furniture so you have a clear path. Avoid moving your furniture around.  If any of your floors are uneven, fix them.  If there are any pets around you, be aware of where they are.  Review your medicines with your doctor. Some medicines can make you feel dizzy. This can increase your chance of falling. Ask your doctor what other things that you can do to help prevent falls. This information is not intended to replace advice given to you by your health care provider. Make sure you discuss any questions you have with your health care provider. Document Released: 10/22/2008 Document Revised: 06/03/2015 Document Reviewed: 01/30/2014 Elsevier Interactive Patient Education  2017 [employer].

## 2018-08-29 NOTE — Patient Instructions (Signed)

## 2018-08-30 LAB — MICROALBUMIN / CREATININE URINE RATIO
Creatinine, Urine: 82 mg/dL
Microalb/Creat Ratio: 10 mg/g creat (ref 0–29)
Microalbumin, Urine: 8.5 ug/mL

## 2018-09-01 NOTE — Progress Notes (Addendum)
Established Patient Office Visit  Subjective:  Patient ID: Justin Holloway, male    DOB: 05-May-1929  Age: 85 y.o. MRN: [id number]  CC:  Chief Complaint  Patient presents with  . Diabetes  . Hypertension    HPI Justin Holloway presents for diabetes and HTN f/u. He reports compliance with meds. He states his BS are well controlled. He states he is still not drinking sodas. He understands that he has chronic kidney disease.  He is followed by Nephrology as well.  He admits that he does not move around a lot. He feels off balance on occasion. He is ambulatory with a cane.  He is unable to determine when he will experience symptoms. He has also cut back on his salt intake. States his wife does not cook with salt.  He denies chest pain, shortness of breath, blurred vision and palpitations.   Past Medical History:  Diagnosis Date  . Arthritis   . CKD (chronic kidney disease), stage III ([place])   . Diabetes mellitus without complication ([place])   . Glaucoma    "blurred vision", see Dr Gershon Crane yearly  . Hyperlipidemia   . Hypertension   . TIA (transient ischemic attack)     Past Surgical History:  Procedure Laterality Date  . INGUINAL HERNIA REPAIR Left 06/09/2013   Procedure: OPEN REPAIR LEFT INGUINAL HERNIA;  Surgeon: [name], MD;  Location: [facility];  Service: General;  Laterality: Left;  . INSERTION OF MESH Left 06/09/2013   Procedure: INSERTION OF MESH;  Surgeon: [name], MD;  Location: [facility];  Service: General;  Laterality: Left;    Family History  Problem Relation Age of Onset  . Cancer Mother 31       breast  . COPD Father 103       Congestive heart failure    Social History   Socioeconomic History  . Marital status: Married    Spouse name: [name]  . Number of children: 8  . Years of education: 45  . Highest education level: Not on file  Occupational History  . Occupation: retired  Scientific laboratory technician  . Financial resource strain: Not hard at all  . Food  insecurity    Worry: Never true    Inability: Never true  . Transportation needs    Medical: No    Non-medical: No  Tobacco Use  . Smoking status: Former Smoker    Types: Cigars  . Smokeless tobacco: Never Used  Substance and Sexual Activity  . Alcohol use: Never    Frequency: Never  . Drug use: No  . Sexual activity: Not Currently  Lifestyle  . Physical activity    Days per week: 7 days    Minutes per session: 10 min  . Stress: Not at all  Relationships  . Social connections    Talks on phone: More than three times a week    Gets together: More than three times a week    Attends religious service: More than 4 times per year    Active member of club or organization: Yes    Attends meetings of clubs or organizations: More than 4 times per year    Relationship status: Married  . Intimate partner violence    Fear of current or ex partner: No    Emotionally abused: No    Physically abused: No    Forced sexual activity: No  Other Topics Concern  . Not on file  Social History Narrative  Lives with wife at home    caffeine- coffee 1 cup daily    Outpatient Medications Prior to Visit  Medication Sig Dispense Refill  . aspirin EC 81 MG tablet Take 81 mg by mouth every evening.    . Blood Glucose Monitoring Suppl (ONE TOUCH ULTRA 2) w/Device KIT Use as directed to check blood sugars 2 times per day dx: e11.22 1 each 1  . cholecalciferol (VITAMIN D) 1000 units tablet Take 1,000 Units by mouth 2 (two) times daily.    . Easy Comfort Lancets MISC USE LANCETS TO TEST BLOOD SUGAR 3 TIME(S) DAILY , ONE LANCET PER TEST 300 each 2  . furosemide (LASIX) 80 MG tablet Take by mouth. Take 35m in the AM and 470min the early evening.    . gabapentin (NEURONTIN) 100 MG capsule TAKE 2 CAPSULES BY MOUTH EVERY NIGHT 180 capsule 1  . hydrALAZINE (APRESOLINE) 25 MG tablet TAKE 1 TABLET BY MOUTH THREE TIMES DAILY 270 tablet 0  . Insulin Degludec (TRESIBA FLEXTOUCH) 200 UNIT/ML SOPN Inject 25  Units into the skin at bedtime.    . Marland Kitchenetorolac (ACULAR) 0.4 % SOLN INT 1 GTT IN OU QID    . LUMIGAN 0.01 % SOLN     . meclizine (ANTIVERT) 12.5 MG tablet TAKE 1 TABLET BY MOUTH THREE TIMES DAILY AS NEEDED FOR DIZZINESS (Patient not taking: Reported on 08/29/2018) 20 tablet 0  . Multiple Vitamin (MULTIVITAMIN WITH MINERALS) TABS tablet Take 1 tablet by mouth daily.    . Glory RosebushLTRA test strip USE STRIPS TO TEST BLOOD SUGAR 3 TIME(S) DAILY, ONE STRIP PER TEST 100 each 2  . pravastatin (PRAVACHOL) 40 MG tablet TAKE 1 TABLET BY MOUTH EVERY DAY 90 tablet 0  . Semaglutide (OZEMPIC) 0.25 or 0.5 MG/DOSE SOPN Inject 0.5 mg into the skin once a week.     No facility-administered medications prior to visit.     Allergies  Allergen Reactions  . Gabapentin Itching    ROS Review of Systems  Constitutional: Negative.   Respiratory: Negative.   Cardiovascular: Negative.   Gastrointestinal: Negative.   Neurological: Positive for dizziness.       States he is still having issues with vertigo. He is unable to determine what triggers his sx.   Psychiatric/Behavioral: Negative.       Objective:    Physical Exam  Constitutional: He is oriented to person, place, and time. He appears well-developed and well-nourished.  HENT:  Head: Normocephalic and atraumatic.  Neck: Normal range of motion.  Cardiovascular: Normal rate, regular rhythm and normal heart sounds.  Pulmonary/Chest: Effort normal and breath sounds normal.  Neurological: He is alert and oriented to person, place, and time.  Shuffling gait  Skin: Skin is warm.    BP 130/62 (BP Location: Left Arm, Patient Position: Sitting, Cuff Size: Normal)   Pulse 95   Temp 98.1 F (36.7 C) (Oral)   Ht 5' 6.4" (1.687 m)   Wt 226 lb (102.5 kg)   BMI 36.04 kg/m  Wt Readings from Last 3 Encounters:  08/29/18 226 lb (102.5 kg)  08/29/18 226 lb (102.5 kg)  05/28/18 220 lb (99.8 kg)     Health Maintenance Due  Topic Date Due  . FOOT EXAM   10/03/1941  . OPHTHALMOLOGY EXAM  10/03/1941    There are no preventive care reminders to display for this patient.  Lab Results  Component Value Date   TSH 2.620 02/14/2017   Lab Results  Component Value Date  WBC 6.3 09/08/2017   HGB 11.3 (L) 09/08/2017   HCT 36.3 (L) 09/08/2017   MCV 92.8 09/08/2017   PLT 195 09/08/2017   Lab Results  Component Value Date   NA 134 08/29/2018   K 4.2 08/29/2018   CO2 21 08/29/2018   GLUCOSE 192 (H) 08/29/2018   BUN 56 (H) 08/29/2018   CREATININE 3.17 (H) 08/29/2018   BILITOT 0.3 06/10/2018   ALKPHOS 106 06/10/2018   AST 21 06/10/2018   ALT 12 06/10/2018   PROT 6.8 06/10/2018   ALBUMIN 4.1 06/10/2018   CALCIUM 9.7 08/29/2018   ANIONGAP 7 09/10/2017   Lab Results  Component Value Date   CHOL 161 06/10/2018   Lab Results  Component Value Date   HDL 54 06/10/2018   Lab Results  Component Value Date   LDLCALC 94 06/10/2018   Lab Results  Component Value Date   TRIG 66 06/10/2018   Lab Results  Component Value Date   CHOLHDL 3.0 06/10/2018   Lab Results  Component Value Date   HGBA1C 7.9 (H) 08/29/2018      Assessment & Plan:   Problem List Items Addressed This Visit      Genitourinary   Hypertensive nephropathy     Other   Vertigo   Relevant Orders   Ambulatory referral to [facility]    Other Visit Diagnoses    Type 2 diabetes mellitus with stage 4 chronic kidney disease, with long-term current use of insulin ([place])    -  Primary   I will check labs as listed below.    Relevant Orders   BMP8+EGFR (Completed)   Hemoglobin A1c (Completed)   Microalbumin / creatinine urine ratio (Completed)   Loss of balance       He states he is now ready to have inhouse PT. He cancelled earlier this year due to pandemic.    Gait abnormality       Persistent. I will refer him to home health PT   Relevant Orders   Ambulatory referral to [employer] 2 severe obesity due to excess calories with serious comorbidity  and body mass index (BMI) of 36.0 to 36.9 in adult [facility])       He is encouraged to lose ten pounds to decrease cardiac risk.     Per above- HTN is a chronic condition. It is controlled. He will continue with current meds. He was congratulated on his lifestyle changes.   No orders of the defined types were placed in this encounter.   Follow-up: Return in about 2 months (around 10/29/2018), or diabetes check.    [name], MD

## 2018-09-03 ENCOUNTER — Other Ambulatory Visit: Payer: Self-pay | Admitting: Internal Medicine

## 2018-09-03 ENCOUNTER — Telehealth: Payer: Self-pay

## 2018-09-03 MED ORDER — PREGABALIN 25 MG PO CAPS: 25.0000 mg | ORAL_CAPSULE | Freq: Every day | ORAL | 1 refills | Status: DC

## 2018-09-03 NOTE — Telephone Encounter (Signed)
I returned the call and notified patient of his most recent lab results.  The patient wants to know if he can get a medication to replace the gabapentin because the gabapentin has him itching.

## 2018-09-03 NOTE — Telephone Encounter (Signed)
Justin Holloway was notified that the gabapentin was replaced with generic Lyrica 25 mg once at bedtime because the gabapentin was causing itching and for him to let Dr. Baird Cancer know how the medication is doing.

## 2018-09-04 ENCOUNTER — Ambulatory Visit (INDEPENDENT_AMBULATORY_CARE_PROVIDER_SITE_OTHER): Payer: Medicare Other | Admitting: Nurse Practitioner

## 2018-09-04 ENCOUNTER — Other Ambulatory Visit: Payer: Self-pay

## 2018-09-04 ENCOUNTER — Encounter: Payer: Self-pay | Admitting: Nurse Practitioner

## 2018-09-04 VITALS — BP 118/60 | HR 100 | Temp 98.4°F | Ht 65.4 in | Wt 221.8 lb

## 2018-09-04 DIAGNOSIS — M545 Low back pain, unspecified: Secondary | ICD-10-CM

## 2018-09-04 DIAGNOSIS — R1012 Left upper quadrant pain: Secondary | ICD-10-CM | POA: Diagnosis not present

## 2018-09-04 MED ORDER — LINACLOTIDE 72 MCG PO CAPS: 72.0000 ug | ORAL_CAPSULE | Freq: Every day | ORAL | 2 refills | Status: DC

## 2018-09-04 NOTE — Progress Notes (Signed)
Subjective:     Patient ID: Justin Holloway , male    DOB: 09-Feb-1929 , 85 y.o.   MRN: [id number]   Chief Complaint  Patient presents with  . Back Pain    patient states he has a nagging pain on the left side and when he first wakes up in the morning his back hurts really bad and it started about a week ago.    HPI  Reports will have a bowel movement every other day and will sometimes be firm  Back Pain This is a recurrent problem. The current episode started more than 1 month ago. The problem occurs intermittently (mostly in the morning upon getting out of bed and when bending over). The problem has been gradually improving since onset. The quality of the pain is described as aching. The pain does not radiate. The pain is mild. The pain is the same all the time. The symptoms are aggravated by bending. Stiffness is present in the morning. Associated symptoms include abdominal pain. Pertinent negatives include no fever or headaches. Risk factors include obesity and sedentary lifestyle. He has tried nothing for the symptoms.  Abdominal Pain This is a new problem. The onset quality is gradual. The problem occurs intermittently. The problem has been unchanged. The pain is located in the LUQ. The pain is mild. The quality of the pain is sharp. The abdominal pain radiates to the LUQ. Pertinent negatives include no anorexia, arthralgias, fever or headaches. The pain is aggravated by belching and bowel movement. The pain is relieved by nothing. He has tried nothing for the symptoms.     Past Medical History:  Diagnosis Date  . Arthritis   . CKD (chronic kidney disease), stage III ([place])   . Diabetes mellitus without complication ([place])   . Glaucoma    "blurred vision", see Dr Gershon Crane yearly  . Hyperlipidemia   . Hypertension   . TIA (transient ischemic attack)      Family History  Problem Relation Age of Onset  . Cancer Mother 84       breast  . COPD Father 75       Congestive heart  failure     Current Outpatient Medications:  .  aspirin EC 81 MG tablet, Take 81 mg by mouth every evening., Disp: , Rfl:  .  Blood Glucose Monitoring Suppl (ONE TOUCH ULTRA 2) w/Device KIT, Use as directed to check blood sugars 2 times per day dx: e11.22, Disp: 1 each, Rfl: 1 .  cholecalciferol (VITAMIN D) 1000 units tablet, Take 1,000 Units by mouth 2 (two) times daily., Disp: , Rfl:  .  Easy Comfort Lancets MISC, USE LANCETS TO TEST BLOOD SUGAR 3 TIME(S) DAILY , ONE LANCET PER TEST, Disp: 300 each, Rfl: 2 .  furosemide (LASIX) 80 MG tablet, Take by mouth. Take 41m in the AM and 443min the early evening., Disp: , Rfl:  .  gabapentin (NEURONTIN) 100 MG capsule, TAKE 2 CAPSULES BY MOUTH EVERY NIGHT, Disp: 180 capsule, Rfl: 1 .  hydrALAZINE (APRESOLINE) 25 MG tablet, TAKE 1 TABLET BY MOUTH THREE TIMES DAILY, Disp: 270 tablet, Rfl: 0 .  Insulin Degludec (TRESIBA FLEXTOUCH) 200 UNIT/ML SOPN, Inject 25 Units into the skin at bedtime., Disp: , Rfl:  .  ketorolac (ACULAR) 0.4 % SOLN, INT 1 GTT IN OU QID, Disp: , Rfl:  .  LUMIGAN 0.01 % SOLN, , Disp: , Rfl:  .  Multiple Vitamin (MULTIVITAMIN WITH MINERALS) TABS tablet, Take 1 tablet by  mouth daily., Disp: , Rfl:  .  ONETOUCH ULTRA test strip, USE STRIPS TO TEST BLOOD SUGAR 3 TIME(S) DAILY, ONE STRIP PER TEST, Disp: 100 each, Rfl: 2 .  pravastatin (PRAVACHOL) 40 MG tablet, TAKE 1 TABLET BY MOUTH EVERY DAY, Disp: 90 tablet, Rfl: 0 .  pregabalin (LYRICA) 25 MG capsule, Take 1 capsule (25 mg total) by mouth at bedtime., Disp: 30 capsule, Rfl: 1 .  Semaglutide (OZEMPIC) 0.25 or 0.5 MG/DOSE SOPN, Inject 0.5 mg into the skin once a week., Disp: , Rfl:  .  meclizine (ANTIVERT) 12.5 MG tablet, TAKE 1 TABLET BY MOUTH THREE TIMES DAILY AS NEEDED FOR DIZZINESS (Patient not taking: Reported on 08/29/2018), Disp: 20 tablet, Rfl: 0   Allergies  Allergen Reactions  . Gabapentin Itching     Review of Systems  Constitutional: Negative for fatigue and fever.   Respiratory: Negative.   Cardiovascular: Negative.   Gastrointestinal: Positive for abdominal pain. Negative for anorexia.  Endocrine: Negative for polydipsia, polyphagia and polyuria.  Genitourinary: Negative.   Musculoskeletal: Positive for back pain (midback in am and when bending). Negative for arthralgias.  Neurological: Negative.  Negative for dizziness and headaches.  Hematological: Negative.      Today's Vitals   09/04/18 1538  BP: 118/60  Pulse: 100  Temp: 98.4 F (36.9 C)  TempSrc: Oral  Weight: 221 lb 12.8 oz (100.6 kg)  Height: 5' 5.4" (1.661 m)  PainSc: 8   PainLoc: Abdomen   Body mass index is 36.46 kg/m.   Objective:  Physical Exam Constitutional:      Appearance: Normal appearance. He is obese.  Cardiovascular:     Rate and Rhythm: Normal rate and regular rhythm.     Pulses: Normal pulses.     Heart sounds: Normal heart sounds. No murmur.  Pulmonary:     Effort: Pulmonary effort is normal. No respiratory distress.     Breath sounds: Normal breath sounds.  Abdominal:     General: Bowel sounds are normal. There is no distension.     Palpations: Abdomen is soft. There is no mass.     Tenderness: There is no abdominal tenderness.     Hernia: No hernia is present.  Musculoskeletal: Normal range of motion.        General: No swelling or tenderness.     Right lower leg: No edema.     Left lower leg: No edema.  Skin:    Capillary Refill: Capillary refill takes less than 2 seconds.  Neurological:     General: No focal deficit present.     Mental Status: He is alert and oriented to person, place, and time.  Psychiatric:        Mood and Affect: Mood normal.        Behavior: Behavior normal.        Thought Content: Thought content normal.        Judgment: Judgment normal.         Assessment And Plan:   1. Left upper quadrant pain  Negative pain on palpation, no masses palpated  Positive bowel sounds  I suspect this is constipation as he is  taking laxative daily and still has bowel movement firm every other day  Will try sample of linzess 72 mcg if effective he is to call next Tuesday for a prescription - linaclotide (LINZESS) 72 MCG capsule; Take 1 capsule (72 mcg total) by mouth daily before breakfast.  Dispense: 30 capsule; Refill: 2  2. Acute midline low  back pain without sciatica  No current pain illicited on palpation  Encouraged to stretch in bed and can take tylenol as needed if not better     Minette Brine, FNP    THE PATIENT IS ENCOURAGED TO PRACTICE SOCIAL DISTANCING DUE TO THE COVID-19 PANDEMIC.

## 2018-09-04 NOTE — Telephone Encounter (Signed)
Please let him know I sent him lyrica 25mg  at night. I will increase dose as needed.

## 2018-09-10 DIAGNOSIS — R269 Unspecified abnormalities of gait and mobility: Secondary | ICD-10-CM | POA: Diagnosis not present

## 2018-09-10 DIAGNOSIS — R262 Difficulty in walking, not elsewhere classified: Secondary | ICD-10-CM | POA: Diagnosis not present

## 2018-09-10 DIAGNOSIS — H8110 Benign paroxysmal vertigo, unspecified ear: Secondary | ICD-10-CM | POA: Diagnosis not present

## 2018-09-17 ENCOUNTER — Telehealth: Payer: Self-pay

## 2018-09-17 ENCOUNTER — Ambulatory Visit: Payer: Self-pay

## 2018-09-17 ENCOUNTER — Other Ambulatory Visit: Payer: Self-pay

## 2018-09-17 DIAGNOSIS — Z794 Long term (current) use of insulin: Secondary | ICD-10-CM

## 2018-09-17 DIAGNOSIS — I129 Hypertensive chronic kidney disease with stage 1 through stage 4 chronic kidney disease, or unspecified chronic kidney disease: Secondary | ICD-10-CM

## 2018-09-17 DIAGNOSIS — E1122 Type 2 diabetes mellitus with diabetic chronic kidney disease: Secondary | ICD-10-CM

## 2018-09-17 DIAGNOSIS — N184 Chronic kidney disease, stage 4 (severe): Secondary | ICD-10-CM

## 2018-09-18 NOTE — Patient Instructions (Signed)
Visit Information  Goals Addressed      Patient Stated   . "I have poor balance" (pt-stated)       Current Barriers:  [name] Knowledge Deficits related to treatment management for impaired physical mobility . Diabetic Neuropathy . Peripheral Vascular Disease  Nurse Case Manager Clinical Goal(s):  [name] Over the next 90 days, patient will verbalize understanding of plan for in home PT evaluation and treatment for poor balance, strengthening and stamina. 06/28/18 Goal date re-established to 90 days due to COVID-19 treatment delays.   CCM RN CM Interventions:  09/17/18 Completed call with patient   . Evaluation of current treatment plan related to poor gait/balance, PVD and Neuropathy and patient's adherence to plan as established by provider - discussed patient has decided to pursue in home PT . Discussed patient continues to have increased edema to his lower extremities, discussed Dr. Baird Cancer is aware . Encouraged patient to ask his home PT to assist with properly measuring and ordering him for TED hose as recommended by Dr. Baird Cancer - patient will ask the PT to assist . Offered to help coordinate fitting/purchase of TED hose if PT is unable to assist; discussed patient will contact me if assistance is needed . Encouraged patient to continue to elevate his legs while resting and to avoid eating salt or processed foods containing increased amounts of sodium . Assessed for falls, DME needs and or home safety concerns - patient denies, he continues to use his cane at all times and reports he continues to have difficulty with balance and gait . Reinforced to patient to take his time when changing positions and to avoid bending over when possible; discussed the importance of patient using his cane at all times to help avoid falls - pt verbalizes understanding and is agreeable  . Discussed plans with patient for ongoing care management follow up and provided patient with direct contact information for care  management team  Patient Self Care Activities:  . Self administers medications as prescribed . Attends all scheduled provider appointments . Calls pharmacy for medication refills . Performs ADL's independently . Performs IADL's independently . Calls provider office for new concerns or questions  Please see past updates related to this goal by clicking on the "Past Updates" button in the selected goal       . "I know my kidney function is not good" (pt-stated)       Current Barriers:  [name] Knowledge Deficits related to disease process and treatment mangement for renal insufficiency/CKD  Nurse Case Manager Clinical Goal(s):  [name] Over the next 60 days, patient will verbalize basic understanding of chronic kidney disease process and self health management plan as evidenced by patient will be able to verbalize what CKD is and how it is best management to help slow down progression and or ESRD.  CCM RN CM Interventions:  09/17/18: Completed call with patient  . Evaluation of current treatment plan related to stage 4 CKD and patient's adherence to plan as established by provider. . Provided education to patient re: recent CMP results that show persistent decreased renal function . Discussed plans with patient for ongoing care management follow up and provided patient with direct contact information for care management teambasic disease process and treatment management   Patient Self Care Activities:  . Self administers medications as prescribed . Attends all scheduled provider appointments . Calls pharmacy for medication refills . Attends church or other social activities . Performs ADL's independently . Performs IADL's independently .  Calls provider office for new concerns or questions  Initial goal documentation     . "I need to get my A1C under 7" (pt-stated)       Current Barriers:  [name] Knowledge Deficits related to disease process and Self Health management of Diabetes  Nurse Case  Manager Clinical Goal(s):  [name] Over the next 90 days, patient will verbalize basic understanding of Diabetes Mellitus, type 2 disease process and self health management plan as evidenced by patient will understand how to meal plan with low carbs and use portion control and the plate method for best dietary management of Diabetes. 06/28/18 re-established goal date to 90 days due to COVID-19 treatment delays.   CCM RN CM Interventions:  09/17/18 Completed call with patient   . Evaluation of adherence to current treatment plan related to Diabetes and patient's adherence to plan as established by provider . Discussed and reviewed recent A1C has decreased from 9.2 to 7.9 obtained on 08/29/18; discussed target A1C <7.0 . Positive reinforcement provided related to adhering to low carb diet, avoiding sugary foods and drinks, taking diabetic medications exactly as prescribed and implementing exercise as tolerated for best management  . Reinforced previous education provided and rationale to check cbg every am before breakfast and record, calling CM RN for findings outside established parameters -  <80 or >250 . Mailed printed Diabetes Management Home Safety Tool . Discussed plans with patient for ongoing care management follow up and provided patient with direct contact information for care management team  Patient Self Care Activities:  . Self administers medications as prescribed . Attends all scheduled provider appointments . Calls pharmacy for medication refills . Performs ADL's independently . Performs IADL's independently . Calls provider office for new concerns or questions  Please see past updates related to this goal by clicking on the "Past Updates" button in the selected goal       . My gabapentin is making me itch.  I would like to try a different medication for my neuropathy. (pt-stated)       Current Barriers:  . Patient reports "itching" with gabapentin therapy.  He is currently taking  gabapentin 200mg  qHS.  Pharmacist Clinical Goal(s):  [name] Over the next 45 days, patient will work with PharmD and provider towards optimized medication management  Interventions: . Comprehensive medication review performed; medication list updated in electronic medical record . Discussed with patient and MD to work to find an alternative for patient's peripheral neuropathy.  Would avoid TCAs (amitriptyline) given patient's age and potential side effects.  Could consider increase dose if this is a symptom of uncontrolled neuropathy.  Patient states he has not "gotten relief" from taking gabapentin.  Could consider topical agent (lidocaine/capsacin) or low dose Lyrica capsule as therapeutic alternatives. . [name]  CCM RN CM Interventions: 09/17/18 completed call with patient  . Assessed for patient's start of newly prescribed drug, Lyrica; discussed patient has not yet started this medication due to cost issues . Discussed embedded Pharm D may be able to assist; discussed having [name] contact him to discuss more details and offer recommendations - patient agreed . Sent in basket message to [name] Pharm D and requested she follow up with patient regarding financial hardship with cost of Lyrica - [name] replied stating she will contact Mr. Toral to further evaluate and make the appropriate recommendations  Patient Self Care Activities:  . Patient will take medications as prescribed . Patient open to an alternative for his  peripheral neuropathy  Please see past updates related to this goal by clicking on the "Past Updates" button in the selected goal         The patient verbalized understanding of instructions provided today and declined a print copy of patient instruction materials.   Telephone follow up appointment with care management team member scheduled for: 09/24/18  Barb Merino, RN, BSN, CCM Care Management Coordinator [place] [facility]  Direct Phone: [phone]

## 2018-09-18 NOTE — Chronic Care Management (AMB) (Signed)
Chronic Care Management   Follow Up Note   09/17/2018 Name: Justin Holloway MRN: [id number] DOB: 1929-10-22  Referred by: [name], MD Reason for referral : Chronic Care Management (CCM RNCM Telephone Follow up )   Justin Holloway is a 85 y.o. year old male who is a primary care patient of [name], MD. The CCM team was consulted for assistance with chronic disease management and care coordination needs.    Review of patient status, including review of consultants reports, relevant laboratory and other test results, and collaboration with appropriate care team members and the patient's provider was performed as part of comprehensive patient evaluation and provision of chronic care management services.    SDOH (Social Determinants of Health) screening performed today: None. See Care Plan for related entries.   Outpatient Encounter Medications as of 09/17/2018  Medication Sig Note  . aspirin EC 81 MG tablet Take 81 mg by mouth every evening.   . Blood Glucose Monitoring Suppl (ONE TOUCH ULTRA 2) w/Device KIT Use as directed to check blood sugars 2 times per day dx: e11.22   . cholecalciferol (VITAMIN D) 1000 units tablet Take 1,000 Units by mouth 2 (two) times daily.   . Easy Comfort Lancets MISC USE LANCETS TO TEST BLOOD SUGAR 3 TIME(S) DAILY , ONE LANCET PER TEST   . furosemide (LASIX) 80 MG tablet Take by mouth. Take 84m in the AM and 441min the early evening.   . gabapentin (NEURONTIN) 100 MG capsule TAKE 2 CAPSULES BY MOUTH EVERY NIGHT   . hydrALAZINE (APRESOLINE) 25 MG tablet TAKE 1 TABLET BY MOUTH THREE TIMES DAILY   . Insulin Degludec (TRESIBA FLEXTOUCH) 200 UNIT/ML SOPN Inject 25 Units into the skin at bedtime.   . Justin Kitchenetorolac (ACULAR) 0.4 % SOLN INT 1 GTT IN OU QID   . linaclotide (LINZESS) 72 MCG capsule Take 1 capsule (72 mcg total) by mouth daily before breakfast.   . LUMIGAN 0.01 % SOLN    . meclizine (ANTIVERT) 12.5 MG tablet TAKE 1 TABLET BY MOUTH THREE TIMES  DAILY AS NEEDED FOR DIZZINESS (Patient not taking: Reported on 08/29/2018)   . Multiple Vitamin (MULTIVITAMIN WITH MINERALS) TABS tablet Take 1 tablet by mouth daily.   . Glory RosebushLTRA test strip USE STRIPS TO TEST BLOOD SUGAR 3 TIME(S) DAILY, ONE STRIP PER TEST   . pravastatin (PRAVACHOL) 40 MG tablet TAKE 1 TABLET BY MOUTH EVERY DAY   . pregabalin (LYRICA) 25 MG capsule Take 1 capsule (25 mg total) by mouth at bedtime.   . Semaglutide (OZEMPIC) 0.25 or 0.5 MG/DOSE SOPN Inject 0.5 mg into the skin once a week. 05/16/2018: On Fridays    No facility-administered encounter medications on file as of 09/17/2018.      Goals Addressed      Patient Stated   . "I have poor balance" (pt-stated)       Current Barriers:  . Justin Holloway . Diabetic Neuropathy . Peripheral Vascular Disease  Nurse Case Manager Clinical Goal(s):  . Justin Kitchenver the next 90 days, patient will verbalize understanding of plan for in home PT evaluation and treatment for poor balance, strengthening and stamina. 06/28/18 Goal date re-established to 90 days due to COVID-19 treatment delays.   CCM RN CM Interventions:  09/17/18 Completed call with patient   . Evaluation of current treatment plan related to poor gait/balance, PVD and Neuropathy and patient's adherence to plan as established by provider -  discussed patient has decided to pursue in home PT . Discussed patient continues to have increased edema to his lower extremities, discussed Dr. Baird Cancer is aware . Encouraged patient to ask his home PT to assist with properly measuring and ordering him for TED hose as recommended by Dr. Baird Cancer - patient will ask the PT to assist . Offered to help coordinate fitting/purchase of TED hose if PT is unable to assist; discussed patient will contact me if assistance is needed . Encouraged patient to continue to elevate his legs while resting and to avoid eating salt or processed  foods containing increased amounts of sodium . Assessed for falls, DME needs and or home safety concerns - patient denies, he continues to use his cane at all times and reports he continues to have difficulty with balance and gait . Reinforced to patient to take his time when changing positions and to avoid bending over when possible; discussed the importance of patient using his cane at all times to help avoid falls - pt verbalizes understanding and is agreeable  . Discussed plans with patient for ongoing care management follow up and provided patient with direct contact information for care management team  Patient Self Care Activities:  . Self administers medications as prescribed . Attends all scheduled provider appointments . Calls pharmacy for medication refills . Performs ADL's independently . Performs IADL's independently . Calls provider office for new concerns or questions  Please see past updates related to this goal by clicking on the "Past Updates" button in the selected goal       . "I know my kidney function is not good" (pt-stated)       Current Barriers:  Justin Kitchen Knowledge Deficits related to disease process and treatment mangement for renal insufficiency/CKD  Nurse Case Manager Clinical Goal(s):  Justin Kitchen Over the next 60 days, patient will verbalize basic understanding of chronic kidney disease process and self health management plan as evidenced by patient will be able to verbalize what CKD is and how it is best management to help slow down progression and or ESRD.  CCM RN CM Interventions:  09/17/18: Completed call with patient  . Evaluation of current treatment plan related to stage 4 CKD and patient's adherence to plan as established by provider. . Provided education to patient re: recent CMP results that show persistent decreased renal function . Discussed plans with patient for ongoing care management follow up and provided patient with direct contact information for care  management teambasic disease process and treatment management   Patient Self Care Activities:  . Self administers medications as prescribed . Attends all scheduled provider appointments . Calls pharmacy for medication refills . Attends church or other social activities . Performs ADL's independently . Performs IADL's independently . Calls provider office for new concerns or questions  Initial goal documentation     . "I need to get my A1C under 7" (pt-stated)       Current Barriers:  Justin Kitchen Knowledge Deficits related to disease process and Self Health management of Diabetes  Nurse Case Manager Clinical Goal(s):  Justin Kitchen Over the next 90 days, patient will verbalize basic understanding of Diabetes Mellitus, type 2 disease process and self health management plan as evidenced by patient will understand how to meal plan with low carbs and use portion control and the plate method for best dietary management of Diabetes. 06/28/18 re-established goal date to 90 days due to COVID-19 treatment delays.   CCM RN CM Interventions:  09/17/18 Completed call  with patient   . Evaluation of adherence to current treatment plan related to Diabetes and patient's adherence to plan as established by provider . Discussed and reviewed recent A1C has decreased from 9.2 to 7.9 obtained on 08/29/18; discussed target A1C <7.0 . Positive reinforcement provided related to adhering to low carb diet, avoiding sugary foods and drinks, taking diabetic medications exactly as prescribed and implementing exercise as tolerated for best management  . Reinforced previous education provided and rationale to check cbg every am before breakfast and record, calling CM RN for findings outside established parameters -  <80 or >250 . Mailed printed Diabetes Management Home Safety Tool . Discussed plans with patient for ongoing care management follow up and provided patient with direct contact information for care management team  Patient Self  Care Activities:  . Self administers medications as prescribed . Attends all scheduled provider appointments . Calls pharmacy for medication refills . Performs ADL's independently . Performs IADL's independently . Calls provider office for new concerns or questions  Please see past updates related to this goal by clicking on the "Past Updates" button in the selected goal       . My gabapentin is making me itch.  I would like to try a different medication for my neuropathy. (pt-stated)       Current Barriers:  . Patient reports "itching" with gabapentin therapy.  He is currently taking gabapentin 228m qHS.  Pharmacist Clinical Goal(s):  .Justin KitchenOver the next 45 days, patient will work with PharmD and provider towards optimized medication management  Interventions: . Comprehensive medication review performed; medication list updated in electronic medical record . Discussed with patient and MD to work to find an alternative for patient's peripheral neuropathy.  Would avoid TCAs (amitriptyline) given patient's age and potential side effects.  Could consider increase dose if this is a symptom of uncontrolled neuropathy.  Patient states he has not "gotten relief" from taking gabapentin.  Could consider topical agent (lidocaine/capsacin) or low dose Lyrica capsule as therapeutic alternatives. . [name]  CCM RN CM Interventions: 09/17/18 completed call with patient  . Assessed for patient's start of newly prescribed drug, Lyrica; discussed patient has not yet started this medication due to cost issues . Discussed embedded Pharm D may be able to assist; discussed having JAlmyra Freecontact him to discuss more details and offer recommendations - patient agreed . Sent in basket message to JLottie DawsonPharm D and requested she follow up with patient regarding financial hardship with cost of Lyrica - JAlmyra Freereplied stating she will contact Mr. KMartelleto further evaluate and make the  appropriate recommendations  Patient Self Care Activities:  . Patient will take medications as prescribed . Patient open to an alternative for his peripheral neuropathy  Please see past updates related to this goal by clicking on the "Past Updates" button in the selected goal          Telephone follow up appointment with care management team member scheduled for: 09/24/18   [name] RN, BSN, CCM Care Management Coordinator [facility]  Direct Phone: [phone]

## 2018-09-19 DIAGNOSIS — H8110 Benign paroxysmal vertigo, unspecified ear: Secondary | ICD-10-CM | POA: Diagnosis not present

## 2018-09-19 DIAGNOSIS — R262 Difficulty in walking, not elsewhere classified: Secondary | ICD-10-CM | POA: Diagnosis not present

## 2018-09-19 DIAGNOSIS — R269 Unspecified abnormalities of gait and mobility: Secondary | ICD-10-CM | POA: Diagnosis not present

## 2018-09-23 ENCOUNTER — Telehealth: Payer: Self-pay | Admitting: Pharmacist

## 2018-09-24 ENCOUNTER — Ambulatory Visit (INDEPENDENT_AMBULATORY_CARE_PROVIDER_SITE_OTHER): Payer: Medicare Other | Admitting: Pharmacist

## 2018-09-24 ENCOUNTER — Telehealth: Payer: Self-pay

## 2018-09-24 DIAGNOSIS — N184 Chronic kidney disease, stage 4 (severe): Secondary | ICD-10-CM | POA: Diagnosis not present

## 2018-09-24 DIAGNOSIS — E1122 Type 2 diabetes mellitus with diabetic chronic kidney disease: Secondary | ICD-10-CM

## 2018-09-24 DIAGNOSIS — I129 Hypertensive chronic kidney disease with stage 1 through stage 4 chronic kidney disease, or unspecified chronic kidney disease: Secondary | ICD-10-CM | POA: Diagnosis not present

## 2018-09-24 DIAGNOSIS — H401134 Primary open-angle glaucoma, bilateral, indeterminate stage: Secondary | ICD-10-CM | POA: Diagnosis not present

## 2018-09-24 DIAGNOSIS — Z794 Long term (current) use of insulin: Secondary | ICD-10-CM

## 2018-09-25 ENCOUNTER — Other Ambulatory Visit: Payer: Self-pay | Admitting: Pharmacy Technician

## 2018-09-25 ENCOUNTER — Encounter: Payer: Self-pay | Admitting: Pharmacy Technician

## 2018-09-25 DIAGNOSIS — D631 Anemia in chronic kidney disease: Secondary | ICD-10-CM | POA: Diagnosis not present

## 2018-09-25 DIAGNOSIS — N189 Chronic kidney disease, unspecified: Secondary | ICD-10-CM | POA: Diagnosis not present

## 2018-09-25 DIAGNOSIS — N184 Chronic kidney disease, stage 4 (severe): Secondary | ICD-10-CM | POA: Diagnosis not present

## 2018-09-25 DIAGNOSIS — N2581 Secondary hyperparathyroidism of renal origin: Secondary | ICD-10-CM | POA: Diagnosis not present

## 2018-09-25 DIAGNOSIS — E8770 Fluid overload, unspecified: Secondary | ICD-10-CM | POA: Diagnosis not present

## 2018-09-25 DIAGNOSIS — I129 Hypertensive chronic kidney disease with stage 1 through stage 4 chronic kidney disease, or unspecified chronic kidney disease: Secondary | ICD-10-CM | POA: Diagnosis not present

## 2018-09-25 NOTE — Patient Outreach (Signed)
[place] [facility]) Care Management  09/25/2018  RAWLEIGH RODE [birth year] [id number]                                       Medication Assistance Referral  Referral From: [facility] RPh Jenne Pane (Clinic RPh)  Medication/Company: [employer] / [place] Patient application portion:  N/A J.P Provider application portion: Faxed  to Dr. Johnnye Lana Provider address/fax verified via: Office website   Follow up:  Will submit to [place] once all documents have been received.  Maud Deed Chana Bode [place] Certified Pharmacy Technician [place] [facility] Direct Dial:[phone]

## 2018-09-27 ENCOUNTER — Telehealth: Payer: Self-pay

## 2018-09-29 NOTE — Patient Instructions (Signed)
Visit Information  Goals Addressed            This Visit's Progress     Patient Stated   . My gabapentin is making me itch.  I would like to try a different medication for my neuropathy. (pt-stated)       Current Barriers:  . Patient reports "itching" with gabapentin therapy.  He is currently taking gabapentin 200mg  qHS.  Pharmacist Clinical Goal(s):  [name] Over the next 45 days, patient will work with PharmD and provider towards optimized medication management  CCM PharmD Interventions: Call completed with patient and wife, [name] . Comprehensive medication review performed; medication list updated in electronic medical record . Discussed with patient and MD to work to find an alternative for patient's peripheral neuropathy.  Would avoid TCAs (amitriptyline) given patient's age and potential side effects.  Could consider increase dose if this is a symptom of uncontrolled neuropathy.  Patient states he has not "gotten relief" from taking gabapentin.  Could consider topical agent (lidocaine/capsacin) or low dose Lyrica capsule as therapeutic alternatives. [name] agreed to start Lyrica 25mg  daily (dose based on renal function). . Patient reports Lyrica is too expensive.  Will attempt patient assistance through [employer].  Application submitted on 09/25/2018. . [name]  CCM RN CM Interventions: 09/17/18 completed call with patient  . Assessed for patient's start of newly prescribed drug, Lyrica; discussed patient has not yet started this medication due to cost issues . Discussed embedded Pharm D may be able to assist; discussed having [name] contact him to discuss more details and offer recommendations - patient agreed . Sent in basket message to [name] Pharm D and requested she follow up with patient regarding financial hardship with cost of Lyrica - [name] replied stating she will contact Mr. Bretado to further evaluate and make the appropriate  recommendations  Patient Self Care Activities:  . Patient will take medications as prescribed . Patient open to an alternative for his peripheral neuropathy  Please see past updates related to this goal by clicking on the "Past Updates" button in the selected goal         The patient verbalized understanding of instructions provided today and declined a print copy of patient instruction materials.   The care management team will reach out to the patient again over the next 4-6 weeks.  [name], PharmD, BCPS Clinical Pharmacist, [facility] [place]: [phone]

## 2018-09-29 NOTE — Progress Notes (Signed)
Chronic Care Management   Visit Note  09/24/2018 Name: Justin Holloway MRN: [id number] DOB: Jan 09, 1930  Referred by: [name], MD Reason for referral : Chronic Care Management   KELLIE CHISOLM is a 85 y.o. year old male who is a primary care patient of [name], MD. The CCM team was consulted for assistance with chronic disease management and care coordination needs.   Review of patient status, including review of consultants reports, relevant laboratory and other test results, and collaboration with appropriate care team members and the patient's provider was performed as part of comprehensive patient evaluation and provision of chronic care management services.    I spoke with Mr. And Mrs. Perreira by telephone today.  Advanced Directives Status: N See Care Plan and Vynca application for related entries.   Medications: Outpatient Encounter Medications as of 09/24/2018  Medication Sig Note  . aspirin EC 81 MG tablet Take 81 mg by mouth every evening.   . Blood Glucose Monitoring Suppl (ONE TOUCH ULTRA 2) w/Device KIT Use as directed to check blood sugars 2 times per day dx: e11.22   . cholecalciferol (VITAMIN D) 1000 units tablet Take 1,000 Units by mouth 2 (two) times daily.   . Easy Comfort Lancets MISC USE LANCETS TO TEST BLOOD SUGAR 3 TIME(S) DAILY , ONE LANCET PER TEST   . furosemide (LASIX) 80 MG tablet Take by mouth. Take 58m in the AM and 474min the early evening.   . gabapentin (NEURONTIN) 100 MG capsule TAKE 2 CAPSULES BY MOUTH EVERY NIGHT   . hydrALAZINE (APRESOLINE) 25 MG tablet TAKE 1 TABLET BY MOUTH THREE TIMES DAILY   . Insulin Degludec (TRESIBA FLEXTOUCH) 200 UNIT/ML SOPN Inject 25 Units into the skin at bedtime.   . Marland Kitchenetorolac (ACULAR) 0.4 % SOLN INT 1 GTT IN OU QID   . linaclotide (LINZESS) 72 MCG capsule Take 1 capsule (72 mcg total) by mouth daily before breakfast.   . LUMIGAN 0.01 % SOLN    . meclizine (ANTIVERT) 12.5 MG tablet TAKE 1 TABLET BY  MOUTH THREE TIMES DAILY AS NEEDED FOR DIZZINESS (Patient not taking: Reported on 08/29/2018)   . Multiple Vitamin (MULTIVITAMIN WITH MINERALS) TABS tablet Take 1 tablet by mouth daily.   . Glory RosebushLTRA test strip USE STRIPS TO TEST BLOOD SUGAR 3 TIME(S) DAILY, ONE STRIP PER TEST   . pravastatin (PRAVACHOL) 40 MG tablet TAKE 1 TABLET BY MOUTH EVERY DAY   . pregabalin (LYRICA) 25 MG capsule Take 1 capsule (25 mg total) by mouth at bedtime.   . Semaglutide (OZEMPIC) 0.25 or 0.5 MG/DOSE SOPN Inject 0.5 mg into the skin once a week. 05/16/2018: On Fridays    No facility-administered encounter medications on file as of 09/24/2018.      Objective:   Goals Addressed            This Visit's Progress     Patient Stated   . My gabapentin is making me itch.  I would like to try a different medication for my neuropathy. (pt-stated)       Current Barriers:  . Patient reports "itching" with gabapentin therapy.  He is currently taking gabapentin 20033mHS.  Pharmacist Clinical Goal(s):  . OMarland Kitchener the next 45 days, patient will work with PharmD and provider towards optimized medication management  CCM PharmD Interventions: Call completed with patient and wife, SanKatharine LookComprehensive medication review performed; medication list updated in electronic medical record . Discussed with patient and MD to work  to find an alternative for patient's peripheral neuropathy.  Would avoid TCAs (amitriptyline) given patient's age and potential side effects.  Could consider increase dose if this is a symptom of uncontrolled neuropathy.  Patient states he has not "gotten relief" from taking gabapentin.  Could consider topical agent (lidocaine/capsacin) or low dose Lyrica capsule as therapeutic alternatives. [name] agreed to start Lyrica 36m daily (dose based on renal function). . Patient reports Lyrica is too expensive.  Will attempt patient assistance through [employer]  Application submitted on 09/25/2018. . Will  continue to follow  CCM RN CM Interventions: 09/17/18 completed call with patient  . Assessed for patient's start of newly prescribed drug, Lyrica; discussed patient has not yet started this medication due to cost issues . Discussed embedded Pharm D may be able to assist; discussed having JAlmyra Freecontact him to discuss more details and offer recommendations - patient agreed . Sent in basket message to JLottie DawsonPharm D and requested she follow up with patient regarding financial hardship with cost of Lyrica - JAlmyra Freereplied stating she will contact Mr. KDelatteto further evaluate and make the appropriate recommendations  Patient Self Care Activities:  . Patient will take medications as prescribed . Patient open to an alternative for his peripheral neuropathy  Please see past updates related to this goal by clicking on the "Past Updates" button in the selected goal           Plan:   The care management team will reach out to the patient again over the next 4-6 weeks  JRegina Eck PharmD, BSouth CorningPharmacist, [place]Perd[place] [phone]

## 2018-09-30 ENCOUNTER — Telehealth: Payer: Self-pay

## 2018-09-30 ENCOUNTER — Other Ambulatory Visit: Payer: Self-pay

## 2018-09-30 ENCOUNTER — Ambulatory Visit: Payer: Self-pay

## 2018-09-30 DIAGNOSIS — R269 Unspecified abnormalities of gait and mobility: Secondary | ICD-10-CM

## 2018-09-30 DIAGNOSIS — E1122 Type 2 diabetes mellitus with diabetic chronic kidney disease: Secondary | ICD-10-CM

## 2018-09-30 DIAGNOSIS — N184 Chronic kidney disease, stage 4 (severe): Secondary | ICD-10-CM | POA: Diagnosis not present

## 2018-09-30 DIAGNOSIS — I129 Hypertensive chronic kidney disease with stage 1 through stage 4 chronic kidney disease, or unspecified chronic kidney disease: Secondary | ICD-10-CM

## 2018-09-30 DIAGNOSIS — Z794 Long term (current) use of insulin: Secondary | ICD-10-CM | POA: Diagnosis not present

## 2018-09-30 DIAGNOSIS — R42 Dizziness and giddiness: Secondary | ICD-10-CM

## 2018-10-01 ENCOUNTER — Telehealth: Payer: Self-pay

## 2018-10-01 ENCOUNTER — Ambulatory Visit: Payer: Self-pay

## 2018-10-01 DIAGNOSIS — I129 Hypertensive chronic kidney disease with stage 1 through stage 4 chronic kidney disease, or unspecified chronic kidney disease: Secondary | ICD-10-CM | POA: Diagnosis not present

## 2018-10-01 DIAGNOSIS — N184 Chronic kidney disease, stage 4 (severe): Secondary | ICD-10-CM | POA: Diagnosis not present

## 2018-10-01 DIAGNOSIS — E1122 Type 2 diabetes mellitus with diabetic chronic kidney disease: Secondary | ICD-10-CM

## 2018-10-01 DIAGNOSIS — Z794 Long term (current) use of insulin: Secondary | ICD-10-CM

## 2018-10-01 NOTE — Patient Instructions (Signed)
Visit Information  Goals Addressed      Patient Stated   . "I have poor balance" (pt-stated)       Current Barriers:  [name] Knowledge Deficits related to treatment management for impaired physical mobility . Diabetic Neuropathy . Peripheral Vascular Disease  Nurse Case Manager Clinical Goal(s):  [name] Over the next 90 days, patient will verbalize understanding of plan for in home PT evaluation and treatment for poor balance, strengthening and stamina. 06/28/18 Goal date re-established to 90 days due to COVID-19 treatment delays.   CCM RN CM Interventions:  09/30/18 Completed call with patient   . Evaluation of current treatment plan related to poor gait/balance, PVD and Neuropathy and patient's adherence to plan as established by provider - discussed patient is currently receiving in home PT and is finding it to be beneficial; discussed Mr. Folson also suffers from vertigo; patient was encouraged to discuss this with the PT as she may also recommend vestibular therapy . Discussed patient continues to have increased edema to his lower extremities, discussed Dr. Baird Cancer is aware; Encouraged patient to ask his home PT to assist with properly measuring and ordering him for compression stockings - patient is agreeable and will discuss at next scheduled visit set for 10/02/18 . Encouraged patient to continue to elevate his legs while resting and to avoid eating salt or processed foods containing increased amounts of sodium . Assessed for falls, DME needs and or home safety concerns - patient denies, he continues to use his quad cane at all times but reports he continues to have difficulty with balance and gait; discussed he has a rollator but prefers to use his cane . Reinforced to patient to take his time when changing positions and to avoid bending over when possible; discussed the importance of patient using his cane at all times to help avoid falls - pt verbalizes understanding and is agreeable   . Discussed plans with patient for ongoing care management follow up and provided patient with direct contact information for care management team   10/01/18 Placed outbound call to [place] [phone]  . Spoke with Lattie Haw PT; discussed she will measure Mr. Kostelnik for knee high compression stockings during his next PT visit scheduled for tomorrow afternoon; discussed she will contact me with the measurements; discussed I will ask Dr. Baird Cancer to send an Rx to [place] for the stockings once the measurements are received . Provided Lattie Haw with the phone number for [place] in case needed to assist with obtaining the proper measurements . In addition, we discussed that Mr. Andel reports worsening vertigo when using his quad cane verses when using his rollator walker; discussed she recommended to Mr. Hulbert to use his rollator verses his quad cane, however he prefers the cane . Discussed she will assist Mr. Verga with learning how to properly apply compression stockings . Sent in basket message to Dr. Baird Cancer updating her on the plan to have PT measure patient for compression stockings and to please send an order to [place] once the measurements are received   Patient Self Care Activities:  . Self administers medications as prescribed . Attends all scheduled provider appointments . Calls pharmacy for medication refills . Performs ADL's independently . Performs IADL's independently . Calls provider office for new concerns or questions  Please see past updates related to this goal by clicking on the "Past Updates" button in the selected goal       . My gabapentin is making me  itch.  I would like to try a different medication for my neuropathy. (pt-stated)       Current Barriers:  . Patient reports "itching" with gabapentin therapy.  He is currently taking gabapentin 200mg  qHS.  Pharmacist Clinical Goal(s):  [name] Over the next 45 days, patient will work with PharmD  and provider towards optimized medication management  CCM PharmD Interventions: Call completed with patient and wife, [name] . Comprehensive medication review performed; medication list updated in electronic medical record . Discussed with patient and MD to work to find an alternative for patient's peripheral neuropathy.  Would avoid TCAs (amitriptyline) given patient's age and potential side effects.  Could consider increase dose if this is a symptom of uncontrolled neuropathy.  Patient states he has not "gotten relief" from taking gabapentin.  Could consider topical agent (lidocaine/capsacin) or low dose Lyrica capsule as therapeutic alternatives. [name] agreed to start Lyrica 25mg  daily (dose based on renal function). . Patient reports Lyrica is too expensive.  Will attempt patient assistance through [employer].  Application submitted on 09/25/2018. . [name]  CCM RN CM Interventions: 09/30/18 completed call with patient  . Assessed for patient's start of newly prescribed drug, Lyrica; discussed patient has not yet started this medication due to cost issues . Provided patient an update regarding the completed application completed by Pharm D with wife . Discussed patient is almost out of his insulin and Ozempic and does not have enough to last through the month of October; reviewed and discussed with patient that he was approved through [employer] to receive [place] and Ozempic through 12/09/18; discussed I will follow up with Pharm D [name] and let him know the procedure for refilling theses meds as well as reapplying for next year . Sent in basket message to [name] Pharm D and requested she follow up with patient regarding his refill for his insulin and Ozempic as well as to discuss the established procedure for reapplying for financial assistance for next calendar year  Patient Self Care Activities:  . Patient will take medications as prescribed . Patient open  to an alternative for his peripheral neuropathy  Please see past updates related to this goal by clicking on the "Past Updates" button in the selected goal         The patient verbalized understanding of instructions provided today and declined a print copy of patient instruction materials.   Telephone follow up appointment with care management team member scheduled for: 10/01/18  Barb Merino, RN, BSN, CCM Care Management Coordinator [place] [facility]  Direct Phone: [phone]

## 2018-10-01 NOTE — Chronic Care Management (AMB) (Signed)
Chronic Care Management   Follow Up Note   09/30/2018 Name: GEVON MARKUS MRN: [id number] DOB: October 08, 1929  Referred by: [name], MD Reason for referral : Chronic Care Management (CCM RNCM Telephone Outreach )   KARMAN VENEY is a 85 y.o. year old male who is a primary care patient of [name], MD. The CCM team was consulted for assistance with chronic disease management and care coordination needs.    Review of patient status, including review of consultants reports, relevant laboratory and other test results, and collaboration with appropriate care team members and the patient's provider was performed as part of comprehensive patient evaluation and provision of chronic care management services.    SDOH (Social Determinants of Health) screening performed today: Financial Strain  (related to affording medications). See Care Plan for related entries.   Advanced Directives Status: N See Care Plan and Vynca application for related entries.  I spoke to Mr. Washabaugh by telephone today for a CCM follow up.   Outpatient Encounter Medications as of 09/30/2018  Medication Sig Note  . aspirin EC 81 MG tablet Take 81 mg by mouth every evening.   . Blood Glucose Monitoring Suppl (ONE TOUCH ULTRA 2) w/Device KIT Use as directed to check blood sugars 2 times per day dx: e11.22   . cholecalciferol (VITAMIN D) 1000 units tablet Take 1,000 Units by mouth 2 (two) times daily.   . Easy Comfort Lancets MISC USE LANCETS TO TEST BLOOD SUGAR 3 TIME(S) DAILY , ONE LANCET PER TEST   . furosemide (LASIX) 80 MG tablet Take by mouth. Take 76m in the AM and 448min the early evening.   . gabapentin (NEURONTIN) 100 MG capsule TAKE 2 CAPSULES BY MOUTH EVERY NIGHT   . hydrALAZINE (APRESOLINE) 25 MG tablet TAKE 1 TABLET BY MOUTH THREE TIMES DAILY   . Insulin Degludec (TRESIBA FLEXTOUCH) 200 UNIT/ML SOPN Inject 25 Units into the skin at bedtime.   . Marland Kitchenetorolac (ACULAR) 0.4 % SOLN INT 1 GTT IN OU QID    . linaclotide (LINZESS) 72 MCG capsule Take 1 capsule (72 mcg total) by mouth daily before breakfast.   . LUMIGAN 0.01 % SOLN    . meclizine (ANTIVERT) 12.5 MG tablet TAKE 1 TABLET BY MOUTH THREE TIMES DAILY AS NEEDED FOR DIZZINESS (Patient not taking: Reported on 08/29/2018)   . Multiple Vitamin (MULTIVITAMIN WITH MINERALS) TABS tablet Take 1 tablet by mouth daily.   . Glory RosebushLTRA test strip USE STRIPS TO TEST BLOOD SUGAR 3 TIME(S) DAILY, ONE STRIP PER TEST   . pravastatin (PRAVACHOL) 40 MG tablet TAKE 1 TABLET BY MOUTH EVERY DAY   . pregabalin (LYRICA) 25 MG capsule Take 1 capsule (25 mg total) by mouth at bedtime.   . Semaglutide (OZEMPIC) 0.25 or 0.5 MG/DOSE SOPN Inject 0.5 mg into the skin once a week. 05/16/2018: On Fridays    No facility-administered encounter medications on file as of 09/30/2018.      Goals Addressed      Patient Stated   . "I have poor balance" (pt-stated)       Current Barriers:  . Marland Kitchennowledge Deficits related to treatment management for impaired physical mobility . Diabetic Neuropathy . Peripheral Vascular Disease  Nurse Case Manager Clinical Goal(s):  . Marland Kitchenver the next 90 days, patient will verbalize understanding of plan for in home PT evaluation and treatment for poor balance, strengthening and stamina. 06/28/18 Goal date re-established to 90 days due to COVID-19 treatment delays.   CCM  RN CM Interventions:  09/30/18 Completed call with patient   . Evaluation of current treatment plan related to poor gait/balance, PVD and Neuropathy and patient's adherence to plan as established by provider - discussed patient is currently receiving in home PT and is finding it to be beneficial; discussed Mr. Batty also suffers from vertigo; patient was encouraged to discuss this with the PT as she may also recommend vestibular therapy . Discussed patient continues to have increased edema to his lower extremities, discussed Dr. Baird Cancer is aware; Encouraged patient to ask his  home PT to assist with properly measuring and ordering him for compression stockings - patient is agreeable and will discuss at next scheduled visit set for 10/02/18 . Encouraged patient to continue to elevate his legs while resting and to avoid eating salt or processed foods containing increased amounts of sodium . Assessed for falls, DME needs and or home safety concerns - patient denies, he continues to use his quad cane at all times but reports he continues to have difficulty with balance and gait; discussed he has a rollator but prefers to use his cane . Reinforced to patient to take his time when changing positions and to avoid bending over when possible; discussed the importance of patient using his cane at all times to help avoid falls - pt verbalizes understanding and is agreeable  . Discussed plans with patient for ongoing care management follow up and provided patient with direct contact information for care management team   10/01/18 Placed outbound call to [place] [phone]  . Spoke with Lattie Haw PT; discussed she will measure Mr. Keilman for knee high compression stockings during his next PT visit scheduled for tomorrow afternoon; discussed she will contact me with the measurements; discussed I will ask Dr. Baird Cancer to send an Rx to [place] for the stockings once the measurements are received . Provided Lattie Haw with the phone number for [place] in case needed to assist with obtaining the proper measurements . In addition, we discussed that Mr. Madani reports worsening vertigo when using his quad cane verses when using his rollator walker; discussed she recommended to Mr. Dearmas to use his rollator verses his quad cane, however he prefers the cane . Discussed she will assist Mr. Jarosz with learning how to properly apply compression stockings . Sent in basket message to Dr. Baird Cancer updating her on the plan to have PT measure patient for compression stockings and to  please send an order to [place] once the measurements are received   Patient Self Care Activities:  . Self administers medications as prescribed . Attends all scheduled provider appointments . Calls pharmacy for medication refills . Performs ADL's independently . Performs IADL's independently . Calls provider office for new concerns or questions  Please see past updates related to this goal by clicking on the "Past Updates" button in the selected goal      . My gabapentin is making me itch.  I would like to try a different medication for my neuropathy. (pt-stated)       Current Barriers:  . Patient reports "itching" with gabapentin therapy.  He is currently taking gabapentin 260m qHS.  Pharmacist Clinical Goal(s):  .Marland KitchenOver the next 45 days, patient will work with PharmD and provider towards optimized medication management  CCM PharmD Interventions: Call completed with patient and wife, SKatharine Look. Comprehensive medication review performed; medication list updated in electronic medical record . Discussed with patient and MD to work to find an  alternative for patient's peripheral neuropathy.  Would avoid TCAs (amitriptyline) given patient's age and potential side effects.  Could consider increase dose if this is a symptom of uncontrolled neuropathy.  Patient states he has not "gotten relief" from taking gabapentin.  Could consider topical agent (lidocaine/capsacin) or low dose Lyrica capsule as therapeutic alternatives. [name] agreed to start Lyrica 10m daily (dose based on renal function). . Patient reports Lyrica is too expensive.  Will attempt patient assistance through [employer]  Application submitted on 09/25/2018. . [name]  CCM RN CM Interventions: 09/30/18 completed call with patient  . Assessed for patient's start of newly prescribed drug, Lyrica; discussed patient has not yet started this medication due to cost issues . Provided patient an update  regarding the completed application completed by Pharm D with wife . Discussed patient is almost out of his insulin and Ozempic and does not have enough to last through the month of October; reviewed and discussed with patient that he was approved through [employer] to receive [place] through 12/09/18; discussed I will follow up with Pharm D JLottie Dawsonand let him know the procedure for refilling theses meds as well as reapplying for next year . Sent in basket message to JLottie DawsonPharm D and requested she follow up with patient regarding his refill for his insulin and Ozempic as well as to discuss the established procedure for reapplying for financial assistance for next calendar year  Patient Self Care Activities:  . Patient will take medications as prescribed . Patient open to an alternative for his peripheral neuropathy  Please see past updates related to this goal by clicking on the "Past Updates" button in the selected goal          Telephone follow up appointment with care management team member scheduled for: 10/01/18   [name] RN, BSN, CCM Care Management Coordinator [place]Management/[facility]  Direct Phone: [phone]

## 2018-10-02 DIAGNOSIS — H8110 Benign paroxysmal vertigo, unspecified ear: Secondary | ICD-10-CM | POA: Diagnosis not present

## 2018-10-02 DIAGNOSIS — R262 Difficulty in walking, not elsewhere classified: Secondary | ICD-10-CM | POA: Diagnosis not present

## 2018-10-02 DIAGNOSIS — R269 Unspecified abnormalities of gait and mobility: Secondary | ICD-10-CM | POA: Diagnosis not present

## 2018-10-02 NOTE — Chronic Care Management (AMB) (Signed)
Chronic Care Management   Follow Up Note   10/01/2018 Name: Justin Holloway MRN: [id number] DOB: 1930/01/01  Referred by: [name], MD Reason for referral : Chronic Care Management (CCM RNCM Telephone Follow up )   Justin Holloway is a 85 y.o. year old male who is a primary care patient of [name], MD. The CCM team was consulted for assistance with chronic disease management and care coordination needs.    Review of patient status, including review of consultants reports, relevant laboratory and other test results, and collaboration with appropriate care team members and the patient's provider was performed as part of comprehensive patient evaluation and provision of chronic care management services.    I spoke with [name] by telephone today to help coordinate his measurements for compression stockings.   Outpatient Encounter Medications as of 10/01/2018  Medication Sig Note  . aspirin EC 81 MG tablet Take 81 mg by mouth every evening.   . Blood Glucose Monitoring Suppl (ONE TOUCH ULTRA 2) w/Device KIT Use as directed to check blood sugars 2 times per day dx: e11.22   . cholecalciferol (VITAMIN D) 1000 units tablet Take 1,000 Units by mouth 2 (two) times daily.   . Easy Comfort Lancets MISC USE LANCETS TO TEST BLOOD SUGAR 3 TIME(S) DAILY , ONE LANCET PER TEST   . furosemide (LASIX) 80 MG tablet Take by mouth. Take '80mg'$  in the AM and '40mg'$  in the early evening.   . gabapentin (NEURONTIN) 100 MG capsule TAKE 2 CAPSULES BY MOUTH EVERY NIGHT   . hydrALAZINE (APRESOLINE) 25 MG tablet TAKE 1 TABLET BY MOUTH THREE TIMES DAILY   . Insulin Degludec (TRESIBA FLEXTOUCH) 200 UNIT/ML SOPN Inject 25 Units into the skin at bedtime.   [name] ketorolac (ACULAR) 0.4 % SOLN INT 1 GTT IN OU QID   . linaclotide (LINZESS) 72 MCG capsule Take 1 capsule (72 mcg total) by mouth daily before breakfast.   . LUMIGAN 0.01 % SOLN    . meclizine (ANTIVERT) 12.5 MG tablet TAKE 1 TABLET BY MOUTH THREE  TIMES DAILY AS NEEDED FOR DIZZINESS (Patient not taking: Reported on 08/29/2018)   . Multiple Vitamin (MULTIVITAMIN WITH MINERALS) TABS tablet Take 1 tablet by mouth daily.   Glory Rosebush ULTRA test strip USE STRIPS TO TEST BLOOD SUGAR 3 TIME(S) DAILY, ONE STRIP PER TEST   . pravastatin (PRAVACHOL) 40 MG tablet TAKE 1 TABLET BY MOUTH EVERY DAY   . pregabalin (LYRICA) 25 MG capsule Take 1 capsule (25 mg total) by mouth at bedtime.   . Semaglutide (OZEMPIC) 0.25 or 0.5 MG/DOSE SOPN Inject 0.5 mg into the skin once a week. 05/16/2018: On Fridays    No facility-administered encounter medications on file as of 10/01/2018.      Goals Addressed      Patient Stated   . "I have poor balance" (pt-stated)       Current Barriers:  [name] Knowledge Deficits related to treatment management for impaired physical mobility . Diabetic Neuropathy . Peripheral Vascular Disease  Nurse Case Manager Clinical Goal(s):  [name] Over the next 90 days, patient will verbalize understanding of plan for in home PT evaluation and treatment for poor balance, strengthening and stamina. 06/28/18 Goal date re-established to 90 days due to COVID-19 treatment delays.   CCM RN CM Interventions:  10/01/18 Completed call with patient   . Informed patient that his in home PT Lattie Haw will measure him for compression stockings at his next scheduled visit set for  10/02/18 - pt verbalizes understanding . Discussed plans with patient for ongoing care management follow up and provided patient with direct contact information for care management team   10/01/18 Placed outbound call to [place] [phone]  . Spoke with Lattie Haw PT; discussed she will measure Mr. Bracamonte for knee high compression stockings during his next PT visit scheduled for tomorrow afternoon; discussed she will contact me with the measurements; discussed I will ask Dr. Baird Cancer to send an Rx to [place] for the stockings once the measurements are received . Provided  Lattie Haw with the phone number for [place] in case needed to assist with obtaining the proper measurements . In addition, we discussed that Mr. Souder reports worsening vertigo when using his quad cane verses when using his rollator walker; discussed she recommended to Mr. Ingwersen to use his rollator verses his quad cane, however he prefers the cane . Discussed she will assist Mr. Kracht with learning how to properly apply compression stockings . Sent in basket message to Dr. Baird Cancer updating her on the plan to have PT measure patient for compression stockings and to please send an order to [place] once the measurements are received . Inbound call received from [facility] stating she will provide Mr. Mahar with a pair of cotton mercury weighted stockings to try, she will also complete the measurements for the compression stockings as previously discussed  10/02/18 Provider Collaboration ([facility] - [facility] PT)  . Inbound call received from [facility] from [facility]; voice message received stating she completed the ankle/calf measurements for Mr. Melucci for compression stockings; R ankle 32 cm, R calf 45 cm; L ankle 30.5 cm and L calf 42.5 cm; voice message states Mr. Wakeley may be more compliant and find it easier to apply the jobst active wear compression socks (15-20 mm of Mercury) verses the compression stockings; Lattie Haw states she can order the compression socks if needed with an MD order if Dr. Baird Cancer approves . Sent in basket message to Dr. Baird Cancer making her aware   Patient Self Care Activities:  . Self administers medications as prescribed . Attends all scheduled provider appointments . Calls pharmacy for medication refills . Performs ADL's independently . Performs IADL's independently . Calls provider office for new concerns or questions  Please see past updates related to this goal by clicking on the "Past Updates" button in the selected goal       . My gabapentin is making me  itch.  I would like to try a different medication for my neuropathy. (pt-stated)       Current Barriers:  . Patient reports "itching" with gabapentin therapy.  He is currently taking gabapentin '200mg'$  qHS.  Pharmacist Clinical Goal(s):  [name] Over the next 45 days, patient will work with PharmD and provider towards optimized medication management  CCM PharmD Interventions: Call completed with patient and wife, [name] . Comprehensive medication review performed; medication list updated in electronic medical record . Discussed with patient and MD to work to find an alternative for patient's peripheral neuropathy.  Would avoid TCAs (amitriptyline) given patient's age and potential side effects.  Could consider increase dose if this is a symptom of uncontrolled neuropathy.  Patient states he has not "gotten relief" from taking gabapentin.  Could consider topical agent (lidocaine/capsacin) or low dose Lyrica capsule as therapeutic alternatives. [name] agreed to start Lyrica '25mg'$  daily (dose based on renal function). . Patient reports Lyrica is too expensive.  Will attempt patient  assistance through [employer].  Application submitted on 09/25/2018. . [name]  CCM RN CM Interventions: 10/01/18 completed call with patient  . Provided patient with an update regarding the procedure for refilling his [place] and Ozempic . Discussed embedded Pharm D [name] will follow up with him to assist prior to him running out of his medication - advised [name] will also advise him on the procedure for reapplying for assistance for the next calendar year - pt verbalizes understanding  . Sent in basket message to [name] Pharm D and requested she follow up with patient regarding his refill for his insulin and Ozempic as well as to discuss the established procedure for reapplying for financial assistance for next calendar year  Patient Self Care Activities:  . Patient will take medications as  prescribed . Patient open to an alternative for his peripheral neuropathy  Please see past updates related to this goal by clicking on the "Past Updates" button in the selected goal          Telephone follow up appointment with care management team member scheduled for: 10/09/18   Barb Merino, RN, BSN, CCM Care Management Coordinator [facility]  Direct Phone: [phone]

## 2018-10-02 NOTE — Patient Instructions (Signed)
Visit Information  Goals Addressed      Patient Stated   . "I have poor balance" (pt-stated)       Current Barriers:  [name] Knowledge Deficits related to treatment management for impaired physical mobility . Diabetic Neuropathy . Peripheral Vascular Disease  Nurse Case Manager Clinical Goal(s):  [name] Over the next 90 days, patient will verbalize understanding of plan for in home PT evaluation and treatment for poor balance, strengthening and stamina. 06/28/18 Goal date re-established to 90 days due to COVID-19 treatment delays.   CCM RN CM Interventions:  10/01/18 Completed call with patient   . Informed patient that his in home PT Lattie Haw will measure him for compression stockings at his next scheduled visit set for 10/02/18 - pt verbalizes understanding . Discussed plans with patient for ongoing care management follow up and provided patient with direct contact information for care management team   10/01/18 Placed outbound call to [place] [phone]  . Spoke with Lattie Haw PT; discussed she will measure Mr. Dorko for knee high compression stockings during his next PT visit scheduled for tomorrow afternoon; discussed she will contact me with the measurements; discussed I will ask Dr. Baird Cancer to send an Rx to [place] for the stockings once the measurements are received . Provided Lattie Haw with the phone number for [place] in case needed to assist with obtaining the proper measurements . In addition, we discussed that Mr. Bensinger reports worsening vertigo when using his quad cane verses when using his rollator walker; discussed she recommended to Mr. Qualley to use his rollator verses his quad cane, however he prefers the cane . Discussed she will assist Mr. Lobato with learning how to properly apply compression stockings . Sent in basket message to Dr. Baird Cancer updating her on the plan to have PT measure patient for compression stockings and to please send an order to [place]  once the measurements are received . Inbound call received from [facility] stating she will provide Mr. Hohensee with a pair of cotton mercury weighted stockings to try, she will also complete the measurements for the compression stockings as previously discussed  10/02/18 Provider Collaboration ([facility] - [facility])  . Inbound call received from [place] [facility] from [facility]; voice message received stating she completed the ankle/calf measurements for Mr. Kimberlin for compression stockings; R ankle 32 cm, R calf 45 cm; L ankle 30.5 cm and L calf 42.5 cm; voice message states Mr. Vandyne may be more compliant and find it easier to apply the jobst active wear compression socks (15-20 mm of Mercury) verses the compression stockings; Lattie Haw states she can order the compression socks if needed with an MD order if Dr. Baird Cancer approves . Sent in basket message to Dr. Baird Cancer making her aware   Patient Self Care Activities:  . Self administers medications as prescribed . Attends all scheduled provider appointments . Calls pharmacy for medication refills . Performs ADL's independently . Performs IADL's independently . Calls provider office for new concerns or questions  Please see past updates related to this goal by clicking on the "Past Updates" button in the selected goal       . My gabapentin is making me itch.  I would like to try a different medication for my neuropathy. (pt-stated)       Current Barriers:  . Patient reports "itching" with gabapentin therapy.  He is currently taking gabapentin 200mg  qHS.  Pharmacist Clinical Goal(s):  [name] Over the next 45  days, patient will work with PharmD and provider towards optimized medication management  CCM PharmD Interventions: Call completed with patient and wife, [name] . Comprehensive medication review performed; medication list updated in electronic medical record . Discussed with patient and MD to work to find an alternative for patient's peripheral  neuropathy.  Would avoid TCAs (amitriptyline) given patient's age and potential side effects.  Could consider increase dose if this is a symptom of uncontrolled neuropathy.  Patient states he has not "gotten relief" from taking gabapentin.  Could consider topical agent (lidocaine/capsacin) or low dose Lyrica capsule as therapeutic alternatives. [name] agreed to start Lyrica 25mg  daily (dose based on renal function). . Patient reports Lyrica is too expensive.  Will attempt patient assistance through [employer].  Application submitted on 09/25/2018. . [name]  CCM RN CM Interventions: 10/01/18 completed call with patient  . Provided patient with an update regarding the procedure for refilling his [place] and Ozempic . Discussed embedded Pharm D [name] will follow up with him to assist prior to him running out of his medication - advised [name] will also advise him on the procedure for reapplying for assistance for the next calendar year - pt verbalizes understanding  . Sent in basket message to [name] Pharm D and requested she follow up with patient regarding his refill for his insulin and Ozempic as well as to discuss the established procedure for reapplying for financial assistance for next calendar year  Patient Self Care Activities:  . Patient will take medications as prescribed . Patient open to an alternative for his peripheral neuropathy  Please see past updates related to this goal by clicking on the "Past Updates" button in the selected goal         The patient verbalized understanding of instructions provided today and declined a print copy of patient instruction materials.   Telephone follow up appointment with care management team member scheduled for: 10/09/18  Barb Merino, RN, BSN, CCM Care Management Coordinator [facility]  Direct Phone: [phone]

## 2018-10-08 ENCOUNTER — Encounter: Payer: Self-pay | Admitting: Cardiology

## 2018-10-08 ENCOUNTER — Other Ambulatory Visit: Payer: Self-pay

## 2018-10-08 ENCOUNTER — Ambulatory Visit: Payer: Medicare Other | Admitting: Cardiology

## 2018-10-08 VITALS — BP 134/68 | HR 100 | Ht 65.0 in | Wt 205.0 lb

## 2018-10-08 DIAGNOSIS — I10 Essential (primary) hypertension: Secondary | ICD-10-CM

## 2018-10-08 DIAGNOSIS — E1165 Type 2 diabetes mellitus with hyperglycemia: Secondary | ICD-10-CM

## 2018-10-08 DIAGNOSIS — IMO0002 Reserved for concepts with insufficient information to code with codable children: Secondary | ICD-10-CM

## 2018-10-08 DIAGNOSIS — R600 Localized edema: Secondary | ICD-10-CM | POA: Diagnosis not present

## 2018-10-08 DIAGNOSIS — I129 Hypertensive chronic kidney disease with stage 1 through stage 4 chronic kidney disease, or unspecified chronic kidney disease: Secondary | ICD-10-CM | POA: Diagnosis not present

## 2018-10-08 DIAGNOSIS — I739 Peripheral vascular disease, unspecified: Secondary | ICD-10-CM | POA: Diagnosis not present

## 2018-10-08 DIAGNOSIS — Z794 Long term (current) use of insulin: Secondary | ICD-10-CM

## 2018-10-08 DIAGNOSIS — N184 Chronic kidney disease, stage 4 (severe): Secondary | ICD-10-CM

## 2018-10-08 NOTE — Progress Notes (Signed)
Primary Physician:  [name], MD   Patient ID: Justin Holloway, male    DOB: 05/17/1929, 85 y.o.   MRN: [id number]  Subjective:    Chief Complaint  Patient presents with  . Congestive Heart Failure    Discuss  . PVD  . Follow-up    HPI: Justin Holloway  is a 85 y.o. male  with type 2 diabetes, hyperlipidemia, CKD stage 3, and hypertension. He was last seen by [place] in 2018 for autonomic orthostatic hypotension, now presents for follow up as he was recently told by a nurse for his insurance assessment to have a history of CHF and wanted clarification as he never been told this before. Wife present at bedside.  He previously underwent lexiscan nuclear stress test in Feb 2018, that was low risk study. Echocardiogram also at that time showed normal LVEF, indeterminate diastolic filling pattern.   Patient reports that he is doing well. He has chronic leg edema that he states has actually recently improved. Denies any chest pain. Has minimal shortness of breath with over exertion, but is able to do his normal activities fairly well. No PND or orthopnea. States that he does not have dizziness like before.   He reports that diabetes is now much better controlled. He states kidney function has been stable, being followed by [facility].  Past Medical History:  Diagnosis Date  . Arthritis   . CKD (chronic kidney disease), stage III ([place])   . Diabetes mellitus without complication ([place])   . Glaucoma    "blurred vision", see Dr Gershon Crane yearly  . Hyperlipidemia   . Hypertension   . TIA (transient ischemic attack)     Past Surgical History:  Procedure Laterality Date  . INGUINAL HERNIA REPAIR Left 06/09/2013   Procedure: OPEN REPAIR LEFT INGUINAL HERNIA;  Surgeon: [name], MD;  Location: [place];  Service: General;  Laterality: Left;  . INSERTION OF MESH Left 06/09/2013   Procedure: INSERTION OF MESH;  Surgeon: [name], MD;  Location: [place];  Service: General;   Laterality: Left;    Social History   Socioeconomic History  . Marital status: Married    Spouse name: [name]  . Number of children: 8  . Years of education: 33  . Highest education level: Not on file  Occupational History  . Occupation: retired  Scientific laboratory technician  . Financial resource strain: Not hard at all  . Food insecurity    Worry: Never true    Inability: Never true  . Transportation needs    Medical: No    Non-medical: No  Tobacco Use  . Smoking status: Former Smoker    Types: Cigars  . Smokeless tobacco: Never Used  Substance and Sexual Activity  . Alcohol use: Never    Frequency: Never  . Drug use: No  . Sexual activity: Not Currently  Lifestyle  . Physical activity    Days per week: 7 days    Minutes per session: 10 min  . Stress: Not at all  Relationships  . Social connections    Talks on phone: More than three times a week    Gets together: More than three times a week    Attends religious service: More than 4 times per year    Active member of club or organization: Yes    Attends meetings of clubs or organizations: More than 4 times per year    Relationship status: Married  . Intimate partner violence  Fear of current or ex partner: No    Emotionally abused: No    Physically abused: No    Forced sexual activity: No  Other Topics Concern  . Not on file  Social History Narrative   Lives with wife at home    caffeine- coffee 1 cup daily    Review of Systems  Constitution: Negative for decreased appetite, malaise/fatigue, weight gain and weight loss.  Eyes: Negative for visual disturbance.  Cardiovascular: Positive for dyspnea on exertion (over exertion) and leg swelling. Negative for chest pain, claudication, orthopnea, palpitations and syncope.  Respiratory: Negative for hemoptysis and wheezing.   Endocrine: Negative for cold intolerance and heat intolerance.  Hematologic/Lymphatic: Does not bruise/bleed easily.  Skin: Negative for nail changes.   Musculoskeletal: Negative for muscle weakness and myalgias.  Gastrointestinal: Negative for abdominal pain, change in bowel habit, nausea and vomiting.  Neurological: Negative for difficulty with concentration, dizziness, focal weakness and headaches.  Psychiatric/Behavioral: Negative for altered mental status and suicidal ideas.  All other systems reviewed and are negative.     Objective:  Blood pressure 134/68, pulse 100, height 5\' 5"  (1.651 m), weight 205 lb (93 kg), SpO2 97 %. Body mass index is 34.11 kg/m.    Physical Exam  Constitutional: He is oriented to person, place, and time. Vital signs are normal. He appears well-developed and well-nourished.  obese  HENT:  Head: Normocephalic and atraumatic.  Neck: Normal range of motion.  Cardiovascular: Normal rate, regular rhythm, normal heart sounds and intact distal pulses.  Pulses:      Femoral pulses are 1+ on the right side and 1+ on the left side.      Popliteal pulses are 1+ on the right side and 1+ on the left side.       Dorsalis pedis pulses are 0 on the right side and 0 on the left side.       Posterior tibial pulses are 0 on the right side and 0 on the left side.  1+ pitting edema  Pulses difficult to feel due to bodily habitus  Pulmonary/Chest: Effort normal and breath sounds normal. No accessory muscle usage. No respiratory distress.  Abdominal: Soft. Bowel sounds are normal.  Musculoskeletal: Normal range of motion.  Neurological: He is alert and oriented to person, place, and time.  Skin: Skin is warm and dry.  Vitals reviewed.  Radiology: No results found.  Laboratory examination:    CMP Latest Ref Rng & Units 08/29/2018 06/10/2018 12/26/2017  Glucose 65 - 99 mg/dL 192(H) 118(H) 150(H)  BUN 8 - 27 mg/dL 56(H) 43(H) 46(H)  Creatinine 0.76 - 1.27 mg/dL 3.17(H) 2.77(H) 2.94(H)  Sodium 134 - 144 mmol/L 134 138 141  Potassium 3.5 - 5.2 mmol/L 4.2 4.4 4.5  Chloride 96 - 106 mmol/L 93(L) 99 102  CO2 20 - 29  mmol/L 21 22 21   Calcium 8.6 - 10.2 mg/dL 9.7 9.7 9.2  Total Protein 6.0 - 8.5 g/dL - 6.8 6.7  Total Bilirubin 0.0 - 1.2 mg/dL - 0.3 0.3  Alkaline Phos 39 - 117 IU/L - 106 93  AST 0 - 40 IU/L - 21 15  ALT 0 - 44 IU/L - 12 8   CBC Latest Ref Rng & Units 09/08/2017 01/06/2017 02/07/2016  WBC 4.0 - 10.5 K/uL 6.3 4.8 6.4  Hemoglobin 13.0 - 17.0 g/dL 11.3(L) 11.6(L) 12.2(L)  Hematocrit 39.0 - 52.0 % 36.3(L) 36.4(L) 38.5(L)  Platelets 150 - 400 K/uL 195 202 204   Lipid Panel  Component Value Date/Time   CHOL 161 06/10/2018 0855   TRIG 66 06/10/2018 0855   HDL 54 06/10/2018 0855   CHOLHDL 3.0 06/10/2018 0855   CHOLHDL 4.0 08/08/2015 0614   VLDL 17 08/08/2015 0614   LDLCALC 94 06/10/2018 0855   HEMOGLOBIN A1C Lab Results  Component Value Date   HGBA1C 7.9 (H) 08/29/2018   MPG 209 08/08/2015   TSH No results for input(s): TSH in the last 8760 hours.  PRN Meds:. Medications Discontinued During This Encounter  Medication Reason  . pregabalin (LYRICA) 25 MG capsule Change in therapy  . meclizine (ANTIVERT) 12.5 MG tablet Patient Preference  . linaclotide (LINZESS) 72 MCG capsule Change in therapy  . ketorolac (ACULAR) 0.4 % SOLN Change in therapy   Current Meds  Medication Sig  . aspirin EC 81 MG tablet Take 81 mg by mouth every evening.  . cholecalciferol (VITAMIN D) 1000 units tablet Take 1,000 Units by mouth 2 (two) times daily.  . furosemide (LASIX) 80 MG tablet Take by mouth. Take 80mg  in the AM and 40mg  in the early evening.  . gabapentin (NEURONTIN) 100 MG capsule TAKE 2 CAPSULES BY MOUTH EVERY NIGHT (Patient not taking: Reported on 10/10/2018)  . hydrALAZINE (APRESOLINE) 25 MG tablet TAKE 1 TABLET BY MOUTH THREE TIMES DAILY  . Insulin Degludec (TRESIBA FLEXTOUCH) 200 UNIT/ML SOPN Inject 25 Units into the skin at bedtime.  [name] LUMIGAN 0.01 % SOLN   . Multiple Vitamin (MULTIVITAMIN WITH MINERALS) TABS tablet Take 1 tablet by mouth daily.  . pravastatin (PRAVACHOL) 40 MG  tablet TAKE 1 TABLET BY MOUTH EVERY DAY  . Semaglutide (OZEMPIC) 0.25 or 0.5 MG/DOSE SOPN Inject 0.5 mg into the skin once a week.    Cardiac Studies:   Lexiscan myoview stress test 02/28/2016: 1. The resting electrocardiogram demonstrated normal sinus rhythm, normal resting conduction, no resting arrhythmias and nonspecific ST-T changes. Stress EKG is non-diagnostic for ischemia as it a pharmacologic stress using Lexiscan. Rare PAC noted. Stress symptoms included nausea. 2. The perfusion imaging study demonstrates soft tissue attenuation artifact in the inferior wall. There is no demonstrable ischemia or scar. LV systolic function was normal at 73% This is a low risk study.  Echocardiogram 03/01/2016: Very poor echo window and wall motion with reduced sensitivity. Left ventricle cavity is normal in size. Mild concentric hypertrophy of the left ventricle. Normal global wall motion. Indeterminate diastolic filling pattern. Calculated EF 60%. Trace mitral regurgitation. No significant change since 07/04/2012.  Assessment:   Primary hypertension - Plan: EKG 12-Lead  Bilateral leg edema  CKD (chronic kidney disease) stage 4, GFR 15-29 ml/min (HCC)  Uncontrolled type 2 diabetes mellitus, with long-term current use of insulin ([place])  EKG 10/08/2018: Sinus rhythm at 100 bpm, frequent PACs, normal axis, no evidence of ischemia.  Recommendations:   I have explained with the patient and his wife the two types of heart failure including systolic and diastolic heart failure. His previous echocardiogram did not suggest either; however, diastolic dysfunction can not completely be excluded and given his risk factors, is likely.  Clinically he does not appear to be in any acute decompensated heart failure and is overall doing well without any complaints. I suspect his leg edema is related to CKD stage 4, that he reports has been stable. He reports leg edema has actually improved. Has minimal shortness of  breath. I have discussed repeating echocardiogram; however, in view that symptomatically he is doing well, do not see necessity for this. I would  continue to recommend aggressive risk factor modifications with controlling his diabetes, weight, and hypertension. No changes were made to medications today. Patient and his wife were pleased with my evaluation and recommendations. All questions answered. We will see him back on a PRN basis, encouraged them to contact me for any new concerns.   Miquel Dunn, MSN, APRN, FNP-C [facility]. [place] Office: [phone] Fax: [phone]

## 2018-10-09 ENCOUNTER — Ambulatory Visit: Payer: Self-pay | Admitting: Pharmacist

## 2018-10-09 ENCOUNTER — Telehealth: Payer: Self-pay

## 2018-10-09 DIAGNOSIS — E1122 Type 2 diabetes mellitus with diabetic chronic kidney disease: Secondary | ICD-10-CM

## 2018-10-09 DIAGNOSIS — Z794 Long term (current) use of insulin: Secondary | ICD-10-CM | POA: Diagnosis not present

## 2018-10-09 DIAGNOSIS — N184 Chronic kidney disease, stage 4 (severe): Secondary | ICD-10-CM | POA: Diagnosis not present

## 2018-10-10 ENCOUNTER — Other Ambulatory Visit: Payer: Self-pay | Admitting: Pharmacy Technician

## 2018-10-10 ENCOUNTER — Ambulatory Visit (INDEPENDENT_AMBULATORY_CARE_PROVIDER_SITE_OTHER): Payer: Medicare Other

## 2018-10-10 DIAGNOSIS — N184 Chronic kidney disease, stage 4 (severe): Secondary | ICD-10-CM | POA: Diagnosis not present

## 2018-10-10 DIAGNOSIS — E1122 Type 2 diabetes mellitus with diabetic chronic kidney disease: Secondary | ICD-10-CM | POA: Diagnosis not present

## 2018-10-10 DIAGNOSIS — R269 Unspecified abnormalities of gait and mobility: Secondary | ICD-10-CM | POA: Diagnosis not present

## 2018-10-10 DIAGNOSIS — R262 Difficulty in walking, not elsewhere classified: Secondary | ICD-10-CM | POA: Diagnosis not present

## 2018-10-10 DIAGNOSIS — H8110 Benign paroxysmal vertigo, unspecified ear: Secondary | ICD-10-CM | POA: Diagnosis not present

## 2018-10-10 DIAGNOSIS — I129 Hypertensive chronic kidney disease with stage 1 through stage 4 chronic kidney disease, or unspecified chronic kidney disease: Secondary | ICD-10-CM

## 2018-10-10 DIAGNOSIS — Z794 Long term (current) use of insulin: Secondary | ICD-10-CM | POA: Diagnosis not present

## 2018-10-10 NOTE — Chronic Care Management (AMB) (Signed)
Chronic Care Management   Follow Up Note   10/10/2018 Name: Justin Holloway MRN: [id number] DOB: February 02, 1929  Referred by: Justin Chard, Justin Holloway Reason for referral : Chronic Care Management (CCM RNCM Telephone Case Collaboration )   Justin Holloway is a 85 y.o. year old male who is a primary care patient of Justin Chard, Justin Holloway. The CCM team was consulted for assistance with chronic disease management and care coordination needs.    Review of patient status, including review of consultants reports, relevant laboratory and other test results, and collaboration with appropriate care team members and the patient's provider was performed as part of comprehensive patient evaluation and provision of chronic care management services.    I spoke with Justin Holloway from [facility] today regarding Justin Holloway compression stockings.  Outpatient Encounter Medications as of 10/10/2018  Medication Sig Note  . aspirin EC 81 MG tablet Take 81 mg by mouth every evening.   . Blood Glucose Monitoring Suppl (ONE TOUCH ULTRA 2) w/Device KIT Use as directed to check blood sugars 2 times per day dx: e11.22   . cholecalciferol (VITAMIN D) 1000 units tablet Take 1,000 Units by mouth 2 (two) times daily.   . Easy Comfort Lancets MISC USE LANCETS TO TEST BLOOD SUGAR 3 TIME(S) DAILY , ONE LANCET PER TEST   . furosemide (LASIX) 80 MG tablet Take by mouth. Take 30m in the AM and 421min the early evening.   . gabapentin (NEURONTIN) 100 MG capsule TAKE 2 CAPSULES BY MOUTH EVERY NIGHT (Patient not taking: Reported on 10/10/2018)   . hydrALAZINE (APRESOLINE) 25 MG tablet TAKE 1 TABLET BY MOUTH THREE TIMES DAILY   . Insulin Degludec (TRESIBA FLEXTOUCH) 200 UNIT/ML SOPN Inject 25 Units into the skin at bedtime.   . Marland KitchenUMIGAN 0.01 % SOLN    . Multiple Vitamin (MULTIVITAMIN WITH MINERALS) TABS tablet Take 1 tablet by mouth daily.   . Glory RosebushLTRA test strip USE STRIPS TO TEST BLOOD SUGAR 3 TIME(S) DAILY,  ONE STRIP PER TEST   . pravastatin (PRAVACHOL) 40 MG tablet TAKE 1 TABLET BY MOUTH EVERY DAY   . Semaglutide (OZEMPIC) 0.25 or 0.5 MG/DOSE SOPN Inject 0.5 mg into the skin once a week. 05/16/2018: On Fridays    No facility-administered encounter medications on file as of 10/10/2018.      Goals Addressed      Patient Stated   . "I have poor balance" (Holloway-stated)       Current Barriers:  . Marland Kitchennowledge Deficits related to treatment management for impaired physical mobility . Diabetic Neuropathy . Peripheral Vascular Disease  Nurse Case Manager Clinical Goal(s):  . Marland Kitchenver the next 90 days, patient will verbalize understanding of plan for in home Holloway evaluation and treatment for poor balance, strengthening and stamina. 06/28/18 Goal date re-established to 90 days due to COVID-19 treatment delays.   CCM RN CM Interventions:  10/01/18 Spoke to Justin Holloway from [phone]. Inbound call received from Justin Holloway; discussed the jobst active wear compression socks (15-20 mm of Mercury) are on back order with their supplier; discussed these stockings will not be covered by Medicare and their cost is $40 in which Justin Holloway he cannot afford . Discussed LiLattie Hawas found the same product on [employer] $15 and will make Mr. KiRiegerware  . Discussed if he cannot afford this price she will notify the CCM team  . LiLattie Holloway she believes Mr. KiLoveall  will be more compliant with wearing this type of stocking verses the TED hose . Discussed she has a home visit planned for today and will discuss this with him further . Discussed she will assist Justin Holloway with learning how to properly apply compression stockings  Patient Self Care Activities:  . Self administers medications as prescribed . Attends all scheduled provider appointments . Calls pharmacy for medication refills . Performs ADL's independently . Performs IADL's independently . Calls provider office  for new concerns or questions  Please see past updates related to this goal by clicking on the "Past Updates" button in the selected goal           Telephone follow up appointment with care management team member scheduled for: 10/14/18   Barb Merino, RN, BSN, CCM Care Management Coordinator [facility]  Direct Phone: [phone]

## 2018-10-10 NOTE — Patient Instructions (Signed)
Visit Information  Goals Addressed            This Visit's Progress     Patient Stated   . I would like to continue managing my diabetes and get my A1c <7 (pt-stated)       Current Barriers:  . Diabetes: T2DM; most recent A1c 7.9% on 08/29/18 (was 9.2% on 06/10/18)  . Current antihyperglycemic regimen: Tresiba 25 units qHS, Ozempic once weekly sq on Fridays. One touch ultra glucometer . Denies hypoglycemic symptoms; denies hyperglycemic symptoms . Current exercise: walking as able . Current blood glucose readings: patient states FBG<150 in the AM . Cardiovascular risk reduction: o Current hypertensive regimen: hydralazine TID o Current hyperlipidemia regimen: pravastatin 40mg  daily (last filled #90 on 08/13/18) o Watch Scr and insulin/need for medication adjustments.  Renal function continues to decline.  Scr last 3.17 On 08/29/18  Pharmacist Clinical Goal(s):  [name] Over the next 90 days, patient with work with PharmD and primary care provider to address optimization of medication management of chronic conditions.  Interventions: . Comprehensive medication review performed, medication list updated in electronic medical record.  Reviewed medication fill history via insurance claims data confirming patient appears compliant with having his medications filled on time as prescribed by provider. . Reviewed & discussed the following diabetes-related information with patient: o Continue checking blood sugars as directed o Follow ADA recommended "diabetes-friendly" diet  (reviewed healthy snack/food options) o Discussed insulin/GLP-1 injection technique o Reviewed medication purpose/side effects-->patient denies adverse events, denies hypoglycemia, reports FBG<150, most recently in the upper 90s. o Continue taking all medications as prescribed by provider  Patient Self Care Activities:  . Patient will check blood glucose daily , document, and provide at future appointments . Patient will focus on  medication adherence by taking insulin and GLP-1 as prescribed. . Patient will take medications as prescribed . Patient will contact provider with any episodes of hypoglycemia . Patient will report any questions or concerns to provider   Please see past updates related to this goal by clicking on the "Past Updates" button in the selected goal       . My gabapentin is making me itch.  I would like to try a different medication for my neuropathy. (pt-stated)       Current Barriers:  . Patient reports "itching" with gabapentin therapy.  He is currently taking gabapentin 200mg  qHS.  Pharmacist Clinical Goal(s):  [name] Over the next 45 days, patient will work with PharmD and provider towards optimized medication management  CCM PharmD Interventions: 10/09/18: Call completed with patient and wife, Carron Jaggi . Comprehensive medication review performed; medication list updated in electronic medical record . Discussed with patient and MD to work to find an alternative for patient's peripheral neuropathy.  Would avoid TCAs (amitriptyline) given patient's age and potential side effects.  Could consider increase dose if this is a symptom of uncontrolled neuropathy.  Patient states he has not "gotten relief" from taking gabapentin.  Could consider topical agent (lidocaine/capsacin) or low dose Lyrica capsule as therapeutic alternatives.  . [name] agreed to start Lyrica 25mg  daily (dose based on renal function). . Patient reports Lyrica is too expensive.  PAP submitted today for Lyrica patient assistance program through [employer].  Complete application submitted on 10/09/2018. . [name]  CCM RN CM Interventions: 10/01/18 completed call with patient  . Provided patient with an update regarding the procedure for refilling his [place] and Ozempic . Discussed embedded Pharm D [name]  will follow up with him to assist prior to him running out of his medication - advised [name]  will also advise him on the procedure for reapplying for assistance for the next calendar year - pt verbalizes understanding  . Sent in basket message to [name] Pharm D and requested she follow up with patient regarding his refill for his insulin and Ozempic as well as to discuss the established procedure for reapplying for financial assistance for next calendar year  Patient Self Care Activities:  . Patient will take medications as prescribed . Patient open to an alternative for his peripheral neuropathy  Please see past updates related to this goal by clicking on the "Past Updates" button in the selected goal         The patient verbalized understanding of instructions provided today and declined a print copy of patient instruction materials.   The care management team will reach out to the patient again over the next 4-6 weeks.  [name], PharmD, BCPS Clinical Pharmacist, [facility] [place]: [phone]

## 2018-10-10 NOTE — Progress Notes (Signed)
Chronic Care Management   Visit Note  10/09/2018 Name: Justin Holloway MRN: [id number] DOB: [birth year]  Referred by: [name], MD Reason for referral : Chronic Care Management   Justin Holloway is a 85 y.o. year old male who is a primary care patient of [name], MD. The CCM team was consulted for assistance with chronic disease management and care coordination needs.   Review of patient status, including review of consultants reports, relevant laboratory and other test results, and collaboration with appropriate care team members and the patient's provider was performed as part of comprehensive patient evaluation and provision of chronic care management services.    I spoke with Mr. And Mrs. Justin Holloway by telephone today.  Advanced Directives Status: N See Care Plan and Vynca application for related entries.   Medications: Outpatient Encounter Medications as of 10/09/2018  Medication Sig Note  . aspirin EC 81 MG tablet Take 81 mg by mouth every evening.   . Blood Glucose Monitoring Suppl (ONE TOUCH ULTRA 2) w/Device KIT Use as directed to check blood sugars 2 times per day dx: e11.22   . cholecalciferol (VITAMIN D) 1000 units tablet Take 1,000 Units by mouth 2 (two) times daily.   . Easy Comfort Lancets MISC USE LANCETS TO TEST BLOOD SUGAR 3 TIME(S) DAILY , ONE LANCET PER TEST   . furosemide (LASIX) 80 MG tablet Take by mouth. Take 18m in the AM and 476min the early evening.   . gabapentin (NEURONTIN) 100 MG capsule TAKE 2 CAPSULES BY MOUTH EVERY NIGHT (Patient not taking: Reported on 10/10/2018)   . hydrALAZINE (APRESOLINE) 25 MG tablet TAKE 1 TABLET BY MOUTH THREE TIMES DAILY   . Insulin Degludec (TRESIBA FLEXTOUCH) 200 UNIT/ML SOPN Inject 25 Units into the skin at bedtime.   . Marland KitchenUMIGAN 0.01 % SOLN    . Multiple Vitamin (MULTIVITAMIN WITH MINERALS) TABS tablet Take 1 tablet by mouth daily.   . Glory RosebushLTRA test strip USE STRIPS TO TEST BLOOD SUGAR 3 TIME(S) DAILY,  ONE STRIP PER TEST   . pravastatin (PRAVACHOL) 40 MG tablet TAKE 1 TABLET BY MOUTH EVERY DAY   . Semaglutide (OZEMPIC) 0.25 or 0.5 MG/DOSE SOPN Inject 0.5 mg into the skin once a week. 05/16/2018: On Fridays    No facility-administered encounter medications on file as of 10/09/2018.      Objective:   Goals Addressed            This Visit's Progress     Patient Stated   . I would like to continue managing my diabetes and get my A1c <7 (pt-stated)       Current Barriers:  . Diabetes: T2DM; most recent A1c 7.9% on 08/29/18 (was 9.2% on 06/10/18)  . Current antihyperglycemic regimen: Tresiba 25 units qHS, Ozempic once weekly sq on Fridays. One touch ultra glucometer . Denies hypoglycemic symptoms; denies hyperglycemic symptoms . Current exercise: walking as able . Current blood glucose readings: patient states FBG<150 in the AM . Cardiovascular risk reduction: o Current hypertensive regimen: hydralazine TID o Current hyperlipidemia regimen: pravastatin 4075maily (last filled #90 on 08/13/18) o Watch Scr and insulin/need for medication adjustments.  Renal function continues to decline.  Scr last 3.17 On 08/29/18  Pharmacist Clinical Goal(s):  . OMarland Kitchener the next 90 days, patient with work with PharmD and primary care provider to address optimization of medication management of chronic conditions.  Interventions: . Comprehensive medication review performed, medication list updated in electronic medical record.  Reviewed medication  fill history via insurance claims data confirming patient appears compliant with having his medications filled on time as prescribed by provider. . Reviewed & discussed the following diabetes-related information with patient: o Continue checking blood sugars as directed o Follow ADA recommended "diabetes-friendly" diet  (reviewed healthy snack/food options) o Discussed insulin/GLP-1 injection technique o Reviewed medication purpose/side effects-->patient denies adverse  events, denies hypoglycemia, reports FBG<150, most recently in the upper 90s. o Continue taking all medications as prescribed by provider  Patient Self Care Activities:  . Patient will check blood glucose daily , document, and provide at future appointments . Patient will focus on medication adherence by taking insulin and GLP-1 as prescribed. . Patient will take medications as prescribed . Patient will contact provider with any episodes of hypoglycemia . Patient will report any questions or concerns to provider   Please see past updates related to this goal by clicking on the "Past Updates" button in the selected goal       . My gabapentin is making me itch.  I would like to try a different medication for my neuropathy. (pt-stated)       Current Barriers:  . Patient reports "itching" with gabapentin therapy.  He is currently taking gabapentin 234m qHS.  Pharmacist Clinical Goal(s):  .Marland KitchenOver the next 45 days, patient will work with PharmD and provider towards optimized medication management  CCM PharmD Interventions: 10/09/18: Call completed with patient and wife, SConcepcion Holloway. Comprehensive medication review performed; medication list updated in electronic medical record . Discussed with patient and MD to work to find an alternative for patient's peripheral neuropathy.  Would avoid TCAs (amitriptyline) given patient's age and potential side effects.  Could consider increase dose if this is a symptom of uncontrolled neuropathy.  Patient states he has not "gotten relief" from taking gabapentin.  Could consider topical agent (lidocaine/capsacin) or low dose Lyrica capsule as therapeutic alternatives.  . Dr. RGlendale Chardagreed to start Lyrica 255mdaily (dose based on renal function). . Patient reports Lyrica is too expensive.  PAP submitted today for Lyrica patient assistance program through [employer] Complete application submitted on 10/09/2018. . [name]  CCM RN CM  Interventions: 10/01/18 completed call with patient  . Provided patient with an update regarding the procedure for refilling his [place] Ozempic . Discussed embedded Pharm D JuLottie Dawsonill follow up with him to assist prior to him running out of his medication - advised JuAlmyra Freeill also advise him on the procedure for reapplying for assistance for the next calendar year - pt verbalizes understanding  . Sent in basket message to JuLottie Dawsonharm D and requested she follow up with patient regarding his refill for his insulin and Ozempic as well as to discuss the established procedure for reapplying for financial assistance for next calendar year  Patient Self Care Activities:  . Patient will take medications as prescribed . Patient open to an alternative for his peripheral neuropathy  Please see past updates related to this goal by clicking on the "Past Updates" button in the selected goal          Plan:   The care management team will reach out to the patient again over the next 4-6 weeks.  JuRegina EckPharmD, BCPS Clinical Pharmacist, [facility] [phone]

## 2018-10-10 NOTE — Patient Outreach (Signed)
[facility]) Care Management  10/10/2018  Justin Holloway 1929/11/13 [id number]   Received provider portion(s) of patient assistance application(s) for Lyrica. Faxed completed application and required documents into [employer].  Will follow up with company(ies) in 3-5 business days to check status of application(s).  Maud Deed Chana Bode [place] Certified Pharmacy Technician [place] Management Direct Dial:[phone]

## 2018-10-11 ENCOUNTER — Ambulatory Visit: Payer: Self-pay

## 2018-10-11 ENCOUNTER — Other Ambulatory Visit: Payer: Self-pay | Admitting: Pharmacy Technician

## 2018-10-11 DIAGNOSIS — E1122 Type 2 diabetes mellitus with diabetic chronic kidney disease: Secondary | ICD-10-CM

## 2018-10-11 DIAGNOSIS — N184 Chronic kidney disease, stage 4 (severe): Secondary | ICD-10-CM

## 2018-10-11 DIAGNOSIS — I129 Hypertensive chronic kidney disease with stage 1 through stage 4 chronic kidney disease, or unspecified chronic kidney disease: Secondary | ICD-10-CM

## 2018-10-11 NOTE — Patient Outreach (Signed)
[facility]) Care Management  10/11/2018  JAMONT MELLIN 28-Mar-1929 [id number]   Follow up call placed to [employer] regarding patient assistance shipping details for Tyler Aas and Ozempic, Ronny Bacon confirms refill orders had been received and processed. Medication to arrive at providers office in 10-14 business days. Tyler Aas and Larna Daughters Order # [id number]  Follow up:  Will route note to Embedded THN Wallace and Sydnee Cabal B @ [facility] to inform.  Maud Deed Chana Bode [place] Certified Pharmacy Technician [place] Management Direct Dial:[phone]

## 2018-10-11 NOTE — Chronic Care Management (AMB) (Signed)
Chronic Care Management   Follow Up Note   10/11/2018 Name: Justin Holloway MRN: [id number] DOB: 18-Oct-1929  Referred by: [name], MD Reason for referral : Chronic Care Management (CCM RNCM Case Collaboration )   Justin Holloway is a 85 y.o. year old male who is a primary care patient of [name], MD. The CCM team was consulted for assistance with chronic disease management and care coordination needs.    Review of patient status, including review of consultants reports, relevant laboratory and other test results, and collaboration with appropriate care team members and the patient's provider was performed as part of comprehensive patient evaluation and provision of chronic care management services.    I received an inbound call from [employer] [employer] PT with [facility] advising Justin Holloway has been discharged from in home PT due to meeting all goals and he is now following a HEP w/o difficulty.   Outpatient Encounter Medications as of 10/11/2018  Medication Sig Note  . aspirin EC 81 MG tablet Take 81 mg by mouth every evening.   . Blood Glucose Monitoring Suppl (ONE TOUCH ULTRA 2) w/Device KIT Use as directed to check blood sugars 2 times per day dx: e11.22   . cholecalciferol (VITAMIN D) 1000 units tablet Take 1,000 Units by mouth 2 (two) times daily.   . Easy Comfort Lancets MISC USE LANCETS TO TEST BLOOD SUGAR 3 TIME(S) DAILY , ONE LANCET PER TEST   . furosemide (LASIX) 80 MG tablet Take by mouth. Take 40m in the AM and 455min the early evening.   . gabapentin (NEURONTIN) 100 MG capsule TAKE 2 CAPSULES BY MOUTH EVERY NIGHT (Patient not taking: Reported on 10/10/2018)   . hydrALAZINE (APRESOLINE) 25 MG tablet TAKE 1 TABLET BY MOUTH THREE TIMES DAILY   . Insulin Degludec (TRESIBA FLEXTOUCH) 200 UNIT/ML SOPN Inject 25 Units into the skin at bedtime.   . Marland KitchenUMIGAN 0.01 % SOLN    . Multiple Vitamin (MULTIVITAMIN WITH MINERALS) TABS tablet Take 1 tablet by mouth  daily.   . Glory RosebushLTRA test strip USE STRIPS TO TEST BLOOD SUGAR 3 TIME(S) DAILY, ONE STRIP PER TEST   . pravastatin (PRAVACHOL) 40 MG tablet TAKE 1 TABLET BY MOUTH EVERY DAY   . Semaglutide (OZEMPIC) 0.25 or 0.5 MG/DOSE SOPN Inject 0.5 mg into the skin once a week. 05/16/2018: On Fridays    No facility-administered encounter medications on file as of 10/11/2018.      Goals Addressed      Patient Stated   . "I have poor balance" (pt-stated)       Current Barriers:  . Marland Kitchennowledge Deficits related to treatment management for impaired physical mobility . Diabetic Neuropathy . Peripheral Vascular Disease  Nurse Case Manager Clinical Goal(s):  . Marland Kitchenver the next 90 days, patient will verbalize understanding of plan for in home PT evaluation and treatment for poor balance, strengthening and stamina. 06/28/18 Goal date re-established to 90 days due to COVID-19 treatment delays.   CCM RN CM Interventions:  10/11/18 received voice message from LiHarlen LabsT ([facility])  . Inbound call from PT LiHarlen Labstating [name] order the joust cotton mercury weighted stockings [facility] and wife are aware of how to apply these stockings . Informed patient was discharged from PT due to meeting all goals and patient has established a good HEP focusing on hip and lower extremity strengthening with use of Therabands . Informed patient continues  to experience some vertigo with use of his cane and was encouraged by PT to use his rolling walker to help reduce vertigo and fall risk  Patient Self Care Activities:  . Self administers medications as prescribed . Attends all scheduled provider appointments . Calls pharmacy for medication refills . Performs ADL's independently . Performs IADL's independently . Calls provider office for new concerns or questions  Please see past updates related to this goal by clicking on the "Past Updates" button in the selected goal          Telephone follow up appointment with care management team member scheduled for: 10/14/18  Barb Merino, RN, BSN, CCM Care Management Coordinator [place] Management/[facility]  Direct Phone: [phone]

## 2018-10-14 ENCOUNTER — Ambulatory Visit: Payer: Self-pay

## 2018-10-14 DIAGNOSIS — E1122 Type 2 diabetes mellitus with diabetic chronic kidney disease: Secondary | ICD-10-CM

## 2018-10-14 DIAGNOSIS — I129 Hypertensive chronic kidney disease with stage 1 through stage 4 chronic kidney disease, or unspecified chronic kidney disease: Secondary | ICD-10-CM

## 2018-10-14 DIAGNOSIS — Z794 Long term (current) use of insulin: Secondary | ICD-10-CM

## 2018-10-14 NOTE — Chronic Care Management (AMB) (Signed)
Chronic Care Management   Social Work Follow Up Note  10/14/2018 Name: SANDOR ARBOLEDA MRN: [id number] DOB: [birth year]  Linton Ham [place] is a 85 y.o. year old male who is a primary care patient of [name], MD. The CCM team was consulted for assistance with chronic care management and care coordination.  Review of patient status, including review of consultants reports, other relevant assessments, and collaboration with appropriate care team members and the patient's provider was performed as part of comprehensive patient evaluation and provision of chronic care management services.    SW placed an outbound call to the patient to assist with care coordination of patients chronic health needs. See care plan below for today's interventions.  Outpatient Encounter Medications as of 10/14/2018  Medication Sig Note  . aspirin EC 81 MG tablet Take 81 mg by mouth every evening.   . Blood Glucose Monitoring Suppl (ONE TOUCH ULTRA 2) w/Device KIT Use as directed to check blood sugars 2 times per day dx: e11.22   . cholecalciferol (VITAMIN D) 1000 units tablet Take 1,000 Units by mouth 2 (two) times daily.   . Easy Comfort Lancets MISC USE LANCETS TO TEST BLOOD SUGAR 3 TIME(S) DAILY , ONE LANCET PER TEST   . furosemide (LASIX) 80 MG tablet Take by mouth. Take 33m in the AM and 445min the early evening.   . gabapentin (NEURONTIN) 100 MG capsule TAKE 2 CAPSULES BY MOUTH EVERY NIGHT (Patient not taking: Reported on 10/10/2018)   . hydrALAZINE (APRESOLINE) 25 MG tablet TAKE 1 TABLET BY MOUTH THREE TIMES DAILY   . Insulin Degludec (TRESIBA FLEXTOUCH) 200 UNIT/ML SOPN Inject 25 Units into the skin at bedtime.   . Marland KitchenUMIGAN 0.01 % SOLN    . Multiple Vitamin (MULTIVITAMIN WITH MINERALS) TABS tablet Take 1 tablet by mouth daily.   . Glory RosebushLTRA test strip USE STRIPS TO TEST BLOOD SUGAR 3 TIME(S) DAILY, ONE STRIP PER TEST   . pravastatin (PRAVACHOL) 40 MG tablet TAKE 1 TABLET BY MOUTH EVERY DAY   .  Semaglutide (OZEMPIC) 0.25 or 0.5 MG/DOSE SOPN Inject 0.5 mg into the skin once a week. 05/16/2018: On Fridays    No facility-administered encounter medications on file as of 10/14/2018.      Goals Addressed            This Visit's Progress     Patient Stated   . "I have poor balance" (pt-stated)       Current Barriers:  . Marland Kitchennowledge Deficits related to treatment management for impaired physical mobility . Diabetic Neuropathy . Peripheral Vascular Disease  Nurse Case Manager Clinical Goal(s):  . Marland Kitchenver the next 90 days, patient will verbalize understanding of plan for in home PT evaluation and treatment for poor balance, strengthening and stamina. 06/28/18 Goal date re-established to 90 days due to COVID-19 treatment delays.   CCM SW Interventions Completed 10/14/2018 . Outbound call to assist with care coordination of patient stated goal . Confirmed the patient is actively following home exercise plan . Reviewed plan of care with the patient regarding ordering compression stockings from [employer] patient stated "I haven't done that yet because Dr. SaBaird Cancers going to try and get me some" . Assessed for patient concerns or other case management needs . Determined the patient continues to be concerns with balance stating "I'm doing these exercises but it isn't helping. I'm still staggering" . Reviewed options to participate in virtual [place] through [place] assist  with balance . Patient is agreeable to referral. Placed referral via secure e-mail to Maia Petties, Financial risk analyst with [employer] . Collaboration with RN Case Manager regarding updates to the patients plan of care  Patient Self Care Activities:  . Self administers medications as prescribed . Attends all scheduled provider appointments . Calls pharmacy for medication refills . Performs ADL's independently . Performs IADL's independently . Calls provider office for new  concerns or questions  Please see past updates related to this goal by clicking on the "Past Updates" button in the selected goal           Follow Up Plan: SW will follow up with patient by phone over the next 6 weeks.   Daneen Schick, BSW, CDP Social Worker, Certified Dementia Practitioner [place] / [place] Management [phone]  Total time spent performing care coordination and/or care management activities with the patient by phone or face to face = 10 minutes.

## 2018-10-14 NOTE — Patient Instructions (Signed)
Social Worker Visit Information  Goals we discussed today:  Goals Addressed            This Visit's Progress     Patient Stated   . "I have poor balance" (pt-stated)       Current Barriers:  [name] Knowledge Deficits related to treatment management for impaired physical mobility . Diabetic Neuropathy . Peripheral Vascular Disease  Nurse Case Manager Clinical Goal(s):  [name] Over the next 90 days, patient will verbalize understanding of plan for in home PT evaluation and treatment for poor balance, strengthening and stamina. 06/28/18 Goal date re-established to 90 days due to COVID-19 treatment delays.   CCM SW Interventions Completed 10/14/2018 . Outbound call to assist with care coordination of patient stated goal . Confirmed the patient is actively following home exercise plan . Reviewed plan of care with the patient regarding ordering compression stockings from [employer]. The patient stated "I haven't done that yet because Dr. Baird Cancer is going to try and get me some" . Assessed for patient concerns or other case management needs . Determined the patient continues to be concerns with balance stating "I'm doing these exercises but it isn't helping. I'm still staggering" . Reviewed options to participate in virtual [place] classes through [employer] to assist with balance . Patient is agreeable to referral. Placed referral via secure e-mail to Maia Petties, Financial risk analyst with [employer] . Collaboration with RN Case Manager regarding updates to the patients plan of care   Patient Self Care Activities:  . Self administers medications as prescribed . Attends all scheduled provider appointments . Calls pharmacy for medication refills . Performs ADL's independently . Performs IADL's independently . Calls provider office for new concerns or questions  Please see past updates related to this goal by clicking on the "Past Updates" button in the selected goal            Materials Provided: Verbal education about Tax adviser provided by phone  Follow Up Plan: SW will follow up with patient by phone over the next 6 weeks to follow up on outcome of referral.   Daneen Schick, BSW, CDP Social Worker, Certified Dementia Practitioner [place] / [place] Management [phone]

## 2018-10-15 ENCOUNTER — Telehealth: Payer: Self-pay

## 2018-10-15 ENCOUNTER — Other Ambulatory Visit: Payer: Self-pay | Admitting: Pharmacy Technician

## 2018-10-15 NOTE — Patient Outreach (Signed)
[facility]) Care Management  10/15/2018  Justin Holloway [birth year] [id number]    Follow up call placed to [place] regarding patient assistance application(s) for Lyrica , Fritz Pickerel confirms patient has been approved as of 10/6 until 01/09/19. Script for medication missing patient's date of birth and address. Updated script and faxed into [place]  Follow up:  Will follow up with company in 2-3 business days to check shipping status  Maud Deed. Chana Bode [place] Certified Pharmacy Technician [place] Management Direct Dial:[phone]

## 2018-10-16 ENCOUNTER — Ambulatory Visit: Payer: Medicare Other | Admitting: Internal Medicine

## 2018-10-18 ENCOUNTER — Other Ambulatory Visit: Payer: Self-pay | Admitting: Pharmacy Technician

## 2018-10-18 NOTE — Patient Outreach (Signed)
Justin Holloway) Care Management  10/18/2018  Justin Holloway May 28, 1929 [id number]    Follow up call placed to [place] regarding patient assistance shipping details for Lyrica, Justin Holloway confirms patients order has been set up for shipment. 90 days supply of medication to arrive at patients home on 10/13 via [employer].  Follow up:  Will route note to [place] to inform.  Maud Deed Chana Bode [place] Certified Pharmacy Technician [place] Management Direct Dial:[phone]

## 2018-10-21 ENCOUNTER — Emergency Department (HOSPITAL_COMMUNITY): Payer: Medicare Other

## 2018-10-21 ENCOUNTER — Emergency Department (HOSPITAL_COMMUNITY)
Admission: EM | Admit: 2018-10-21 | Discharge: 2018-10-21 | Disposition: A | Discharge status: Home / Self Care | Payer: Medicare Other | Attending: Emergency Medicine | Admitting: Emergency Medicine

## 2018-10-21 ENCOUNTER — Encounter (HOSPITAL_COMMUNITY): Payer: Self-pay | Admitting: Emergency Medicine

## 2018-10-21 ENCOUNTER — Other Ambulatory Visit: Payer: Self-pay

## 2018-10-21 DIAGNOSIS — Z7982 Long term (current) use of aspirin: Secondary | ICD-10-CM | POA: Insufficient documentation

## 2018-10-21 DIAGNOSIS — K5909 Other constipation: Secondary | ICD-10-CM | POA: Diagnosis not present

## 2018-10-21 DIAGNOSIS — K5900 Constipation, unspecified: Secondary | ICD-10-CM | POA: Diagnosis not present

## 2018-10-21 DIAGNOSIS — Z79899 Other long term (current) drug therapy: Secondary | ICD-10-CM | POA: Diagnosis not present

## 2018-10-21 DIAGNOSIS — N184 Chronic kidney disease, stage 4 (severe): Secondary | ICD-10-CM | POA: Insufficient documentation

## 2018-10-21 DIAGNOSIS — Z794 Long term (current) use of insulin: Secondary | ICD-10-CM | POA: Insufficient documentation

## 2018-10-21 DIAGNOSIS — Z87891 Personal history of nicotine dependence: Secondary | ICD-10-CM | POA: Diagnosis not present

## 2018-10-21 DIAGNOSIS — E119 Type 2 diabetes mellitus without complications: Secondary | ICD-10-CM | POA: Diagnosis not present

## 2018-10-21 DIAGNOSIS — K429 Umbilical hernia without obstruction or gangrene: Secondary | ICD-10-CM | POA: Diagnosis not present

## 2018-10-21 DIAGNOSIS — I129 Hypertensive chronic kidney disease with stage 1 through stage 4 chronic kidney disease, or unspecified chronic kidney disease: Secondary | ICD-10-CM | POA: Diagnosis not present

## 2018-10-21 LAB — CBC WITH DIFFERENTIAL/PLATELET
Abs Immature Granulocytes: 0.02 10*3/uL (ref 0.00–0.07)
Basophils Absolute: 0 10*3/uL (ref 0.0–0.1)
Basophils Relative: 1 %
Eosinophils Absolute: 0.1 10*3/uL (ref 0.0–0.5)
Eosinophils Relative: 1 %
HCT: 40.2 % (ref 39.0–52.0)
Hemoglobin: 12.7 g/dL — ABNORMAL LOW (ref 13.0–17.0)
Immature Granulocytes: 0 %
Lymphocytes Relative: 25 %
Lymphs Abs: 2.2 10*3/uL (ref 0.7–4.0)
MCH: 29 pg (ref 26.0–34.0)
MCHC: 31.6 g/dL (ref 30.0–36.0)
MCV: 91.8 fL (ref 80.0–100.0)
Monocytes Absolute: 0.7 10*3/uL (ref 0.1–1.0)
Monocytes Relative: 8 %
Neutro Abs: 5.6 10*3/uL (ref 1.7–7.7)
Neutrophils Relative %: 65 %
Platelets: 214 10*3/uL (ref 150–400)
RBC: 4.38 MIL/uL (ref 4.22–5.81)
RDW: 12.1 % (ref 11.5–15.5)
WBC: 8.6 10*3/uL (ref 4.0–10.5)
nRBC: 0 % (ref 0.0–0.2)

## 2018-10-21 LAB — COMPREHENSIVE METABOLIC PANEL
ALT: 13 U/L (ref 0–44)
AST: 25 U/L (ref 15–41)
Albumin: 4.1 g/dL (ref 3.5–5.0)
Alkaline Phosphatase: 62 U/L (ref 38–126)
Anion gap: 13 (ref 5–15)
BUN: 46 mg/dL — ABNORMAL HIGH (ref 8–23)
CO2: 23 mmol/L (ref 22–32)
Calcium: 9.3 mg/dL (ref 8.9–10.3)
Chloride: 98 mmol/L (ref 98–111)
Creatinine, Ser: 3.45 mg/dL — ABNORMAL HIGH (ref 0.61–1.24)
GFR calc Af Amer: 17 mL/min — ABNORMAL LOW (ref >60)
GFR calc non Af Amer: 15 mL/min — ABNORMAL LOW (ref >60)
Glucose, Bld: 115 mg/dL — ABNORMAL HIGH (ref 70–99)
Potassium: 3.5 mmol/L (ref 3.5–5.1)
Sodium: 134 mmol/L — ABNORMAL LOW (ref 135–145)
Total Bilirubin: 1 mg/dL (ref 0.3–1.2)
Total Protein: 8.1 g/dL (ref 6.5–8.1)

## 2018-10-21 LAB — CBG MONITORING, ED: Glucose-Capillary: 103 mg/dL — ABNORMAL HIGH (ref 70–99)

## 2018-10-21 LAB — LIPASE, BLOOD: Lipase: 56 U/L — ABNORMAL HIGH (ref 11–51)

## 2018-10-21 MED ORDER — POLYETHYLENE GLYCOL 3350 17 GM/SCOOP PO POWD: 1.0000 | Freq: Once | ORAL | 0 refills | Status: AC

## 2018-10-21 MED ORDER — SENNOSIDES-DOCUSATE SODIUM 8.6-50 MG PO TABS: 1.0000 | ORAL_TABLET | Freq: Every day | ORAL | 0 refills | Status: AC

## 2018-10-21 MED ORDER — LACTULOSE 10 G PO PACK: 10.0000 g | PACK | Freq: Three times a day (TID) | ORAL | 0 refills | Status: DC

## 2018-10-21 NOTE — ED Notes (Signed)
Walked patient to the bathroom patient did well 

## 2018-10-21 NOTE — Discharge Instructions (Addendum)
Your CT scan did not reveal any acute abnormality. Here is the read: 1. Moderate amount of stool throughout the nondistended colon with a  moderate amount of stool in the rectum.  2. Umbilical hernia containing fat and a loop of small bowel without  obstruction.      I recommended a clean-out with Miralax. Take 3-4 capfuls in 32 oz of Gatorade. Increase fluid intake. Take over the counter stool softeners. Return to the ED if you have severe and worsening abdominal pain, nausea and vomiting, and are unable to pass gas.

## 2018-10-21 NOTE — ED Provider Notes (Signed)
[facility] EMERGENCY DEPARTMENT Provider Note   CSN: [id number] Arrival date & time: 10/21/18  1020     History   Chief Complaint Chief Complaint  Patient presents with  . Constipation    HPI Justin Holloway is a 85 y.o. male.     85 year old male with CKD, diabetes, hyperlipidemia, hypertension presenting to the emergency department for constipation.  Reports he has not had a bowel movement in 4 days.  Denies any abdominal pain but reports that he has some mild rectal pain when he tries to strain to use the bathroom.  Denies any rectal bleeding, nausea, vomiting, abdominal surgeries, fever, chills, chest pain, shortness of breath.  He has tried 3 over-the-counter stool softeners.  Reports history of the same in the past and lactulose was helpful.     Past Medical History:  Diagnosis Date  . Arthritis   . CKD (chronic kidney disease), stage III   . Diabetes mellitus without complication ([place])   . Glaucoma    "blurred vision", see Dr Gershon Crane yearly  . Hyperlipidemia   . Hypertension   . TIA (transient ischemic attack)     Patient Active Problem List   Diagnosis Date Noted  . Left upper quadrant pain 09/04/2018  . Acute midline low back pain without sciatica 09/04/2018  . CKD (chronic kidney disease) stage 4, GFR 15-29 ml/min (HCC) 11/21/2017  . Hypertensive nephropathy 11/21/2017  . Right foot pain 11/21/2017  . Vertigo 09/09/2017  . BPPV (benign paroxysmal positional vertigo), right 09/06/2017  . Sensorineural hearing loss (SNHL), bilateral 09/06/2017  . Ataxia 02/06/2017  . UTI (lower urinary tract infection) 08/07/2015  . Dizziness 08/07/2015  . Acute renal failure superimposed on stage 3 chronic kidney disease ([place]) 08/07/2015  . Hyperkalemia 08/07/2015  . Left inguinal hernia 05/15/2013  . Diabetes mellitus without complication ([place]) 92/42/6834  . HLD (hyperlipidemia) 03/08/2006  . HYPERTENSION, BENIGN SYSTEMIC 03/08/2006  . IMPOTENCE,  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  . PROTEINURIA 03/08/2006    Past Surgical History:  Procedure Laterality Date  . INGUINAL HERNIA REPAIR Left 06/09/2013   Procedure: OPEN REPAIR LEFT INGUINAL HERNIA;  Surgeon: [name], MD;  Location: [facility];  Service: General;  Laterality: Left;  . INSERTION OF MESH Left 06/09/2013   Procedure: INSERTION OF MESH;  Surgeon: [name], MD;  Location: [facility];  Service: General;  Laterality: Left;        Home Medications    Prior to Admission medications   Medication Sig Start Date End Date Taking? Authorizing Provider  aspirin EC 81 MG tablet Take 81 mg by mouth every evening.    [provider]  Blood Glucose Monitoring Suppl (ONE TOUCH ULTRA 2) w/Device KIT Use as directed to check blood sugars 2 times per day dx: e11.22 05/24/18   [name], MD  cholecalciferol (VITAMIN D) 1000 units tablet Take 1,000 Units by mouth 2 (two) times daily.    [provider]  Easy Comfort Lancets MISC USE LANCETS TO TEST BLOOD SUGAR 3 TIME(S) DAILY , ONE LANCET PER TEST 07/30/18   [name], MD  furosemide (LASIX) 80 MG tablet Take by mouth. Take 45m in the AM and 434min the early evening.    [provider]  gabapentin (NEURONTIN) 100 MG capsule TAKE 2 CAPSULES BY MOUTH EVERY NIGHT Patient not taking: Reported on 10/10/2018 07/11/18   SaGlendale ChardMD  hydrALAZINE (APRESOLINE) 25 MG tablet TAKE 1 TABLET BY MOUTH THREE TIMES DAILY 08/08/18  Minette Brine, FNP  Insulin Degludec (TRESIBA FLEXTOUCH) 200 UNIT/ML SOPN Inject 25 Units into the skin at bedtime.    [provider]  LUMIGAN 0.01 % SOLN  01/22/18   [provider]  Multiple Vitamin (MULTIVITAMIN WITH MINERALS) TABS tablet Take 1 tablet by mouth daily.    [provider]  ONETOUCH ULTRA test strip USE STRIPS TO TEST BLOOD SUGAR 3 TIME(S) DAILY, ONE STRIP PER TEST 07/30/18   [name], MD  pravastatin (PRAVACHOL) 40 MG tablet TAKE 1 TABLET BY MOUTH EVERY  DAY 08/13/18   [name], MD  Semaglutide [facility]) 0.25 or 0.5 MG/DOSE SOPN Inject 0.5 mg into the skin once a week.    [provider]    Family History Family History  Problem Relation Age of Onset  . Cancer Mother 86       breast  . COPD Father 42       Congestive heart failure    Social History Social History   Tobacco Use  . Smoking status: Former Smoker    Types: Cigars  . Smokeless tobacco: Never Used  Substance Use Topics  . Alcohol use: Never    Frequency: Never  . Drug use: No     Allergies   Gabapentin   Review of Systems Review of Systems  Constitutional: Negative for appetite change, diaphoresis, fatigue and fever.  HENT: Negative for congestion and sore throat.   Respiratory: Negative for cough and shortness of breath.   Cardiovascular: Negative for chest pain.  Gastrointestinal: Positive for abdominal distention and constipation. Negative for abdominal pain, anal bleeding, blood in stool, diarrhea, nausea, rectal pain and vomiting.  Endocrine: Negative for polyuria.  Genitourinary: Negative for dysuria and flank pain.  Musculoskeletal: Negative for back pain.  Skin: Negative for rash.  Neurological: Negative for dizziness and light-headedness.     Physical Exam Updated Vital Signs BP 128/69   Pulse 78   Temp 98.3 F (36.8 C) (Oral)   Resp 18   Ht 5' 5" (1.651 m)   Wt 99.8 kg   SpO2 96%   BMI 36.61 kg/m   Physical Exam Vitals signs and nursing note reviewed.  Constitutional:      Appearance: Normal appearance.  HENT:     Head: Normocephalic.     Nose: Nose normal.     Mouth/Throat:     Mouth: Mucous membranes are moist.  Eyes:     Conjunctiva/sclera: Conjunctivae normal.  Cardiovascular:     Rate and Rhythm: Normal rate and regular rhythm.  Pulmonary:     Effort: Pulmonary effort is normal.  Abdominal:     General: There is distension.     Tenderness: There is no abdominal tenderness. There is no right CVA  tenderness, left CVA tenderness, guarding or rebound.     Hernia: No hernia is present.  Musculoskeletal:     Right lower leg: No edema.     Left lower leg: No edema.  Skin:    General: Skin is dry.  Neurological:     Mental Status: He is alert.  Psychiatric:        Mood and Affect: Mood normal.      ED Treatments / Results  Labs (all labs ordered are listed, but only abnormal results are displayed) Labs Reviewed  CBG MONITORING, ED - Abnormal; Notable for the following components:      Result Value   Glucose-Capillary 103 (*)    All other components within normal limits  COMPREHENSIVE  METABOLIC PANEL  CBC WITH DIFFERENTIAL/PLATELET  LIPASE, BLOOD    EKG None  Radiology Dg Abdomen 1 View  Result Date: 10/21/2018 CLINICAL DATA:  Constipation and distension. EXAM: ABDOMEN - 1 VIEW COMPARISON:  None. FINDINGS: The bowel gas pattern is normal. No radio-opaque calculi or other significant radiographic abnormality are seen. Mild degenerative changes are noted in the lower lumbar spine. IMPRESSION: Normal bowel gas pattern. Electronically Signed   By: San Morelle M.D.   On: 10/21/2018 13:31    Procedures Procedures (including critical care time)  Medications Ordered in ED Medications - No data to display   Initial Impression / Assessment and Plan / ED Course  I have reviewed the triage vital signs and the nursing notes.  Pertinent labs & imaging results that were available during my care of the patient were reviewed by me and considered in my medical decision making (see chart for details).  Clinical Course as of Oct 20 1540  Mon Oct 21, 2018  1519 85 y/o with hx constipation here for no bowel movement x4 days. Has tried OTC stool softner x3. Reports in the past lactulose helped. Does have abdominal distension on my exam. No tenderness. Abdominal xray reassuring. Pending labs. If ok, he can be sent home with rx for lactulose. If abnormal labs then consider CT  abdomen/pelvis.    [KM]  4696 Patient signed out to Regan Lemming MD resident due to change of shift   [KM]    Clinical Course User Index [KM] Alveria Apley, PA-C         Final Clinical Impressions(s) / ED Diagnoses   Final diagnoses:  Other constipation    ED Discharge Orders    None       Alveria Apley, PA-C 10/21/18 1542    Pattricia Boss, MD 10/23/18 1441

## 2018-10-21 NOTE — Progress Notes (Signed)
Pt is not on HO19 spot;RN to place PIV request when pt is back.

## 2018-10-21 NOTE — ED Notes (Signed)
ED Provider at bedside. 

## 2018-10-21 NOTE — ED Notes (Signed)
Patient transported to X-ray 

## 2018-10-21 NOTE — ED Provider Notes (Signed)
  Physical Exam  BP 128/69   Pulse 78   Temp 98.3 F (36.8 C) (Oral)   Resp 18   Ht 5\' 5"  (1.651 m)   Wt 99.8 kg   SpO2 96%   BMI 36.61 kg/m   Physical Exam Vitals signs and nursing note reviewed.  Constitutional:      Appearance: He is well-developed.  HENT:     Head: Normocephalic and atraumatic.  Eyes:     Conjunctiva/sclera: Conjunctivae normal.  Neck:     Musculoskeletal: Neck supple.  Cardiovascular:     Rate and Rhythm: Normal rate and regular rhythm.  Pulmonary:     Effort: Pulmonary effort is normal. No respiratory distress.     Breath sounds: Normal breath sounds.  Abdominal:     General: There is distension.     Palpations: Abdomen is soft.     Tenderness: There is no abdominal tenderness.  Skin:    General: Skin is warm and dry.     Capillary Refill: Capillary refill takes less than 2 seconds.  Neurological:     Mental Status: He is alert and oriented to person, place, and time.     ED Course/Procedures   Clinical Course as of Oct 20 1952  Mon Oct 21, 2018  1519 85 y/o with hx constipation here for no bowel movement x4 days. Has tried OTC stool softner x3. Reports in the past lactulose helped. Does have abdominal distension on my exam. No tenderness. Abdominal xray reassuring. Pending labs. If ok, he can be sent home with rx for lactulose. If abnormal labs then consider CT abdomen/pelvis.    [KM]  1610 Patient signed out to Regan Lemming MD resident due to change of shift   [KM]  1637 Lipase(!): 56 [JL]    Clinical Course User Index [JL] Regan Lemming, MD [KM] Madilyn Hook A, PA-C    Procedures  MDM    No bowel movement in 4 days. Nontender abdomen. Needs lactulose. If imaging results negative, can DC home.   The patient is an 85 year old male who presents to the ED with concern for constipation without a bowel movement in the past four days. His abdominal XR was normal. A CT of the Abdomen and Pelvis was obtained to evaluate for obstruction  which also was normal. His lipase was mildly elevated to 56. He denies abdominal pain, nausea, or vomiting. He is passing gas. Overall feel that the patient's symptoms are consistent with constipation. Prescribed Miralax, Senna, and Lactulose and advised that the patient follow-up with his PCP.       Regan Lemming, MD 10/21/18 2001    Deno Etienne, DO 10/21/18 2122

## 2018-10-21 NOTE — ED Triage Notes (Signed)
Pt reports no BM for 4 days. States tried taking 3 stool softeners at home with no relief. NAD at present.

## 2018-10-23 ENCOUNTER — Ambulatory Visit: Payer: Medicare Other | Admitting: Podiatry

## 2018-10-28 ENCOUNTER — Telehealth: Payer: Self-pay

## 2018-10-28 NOTE — Telephone Encounter (Signed)
The pt called and said that he is having trouble with moving his bowels again and wanted a refill on a medication he got from the ER.  The pt was asked if he has been using the miralex daily as instructed by Dr. Baird Cancer and the pt said no because he didn't get an rx.  The pt was told that the miralex is o-t-c and to make sure he takes it daily and to let the office know if he still has a problem.

## 2018-10-30 ENCOUNTER — Telehealth: Payer: Self-pay

## 2018-10-30 ENCOUNTER — Telehealth: Payer: Self-pay | Admitting: Pharmacist

## 2018-10-30 NOTE — Telephone Encounter (Signed)
Pt informed that his medication from the pap is here that he could come pick it up.

## 2018-10-31 DIAGNOSIS — H401133 Primary open-angle glaucoma, bilateral, severe stage: Secondary | ICD-10-CM | POA: Diagnosis not present

## 2018-11-07 ENCOUNTER — Other Ambulatory Visit: Payer: Self-pay

## 2018-11-07 MED ORDER — PRAVASTATIN SODIUM 40 MG PO TABS: ORAL_TABLET | ORAL | 1 refills | Status: DC

## 2018-11-11 ENCOUNTER — Ambulatory Visit (INDEPENDENT_AMBULATORY_CARE_PROVIDER_SITE_OTHER): Payer: Medicare Other

## 2018-11-11 ENCOUNTER — Telehealth: Payer: Self-pay

## 2018-11-11 DIAGNOSIS — Z794 Long term (current) use of insulin: Secondary | ICD-10-CM | POA: Diagnosis not present

## 2018-11-11 DIAGNOSIS — N184 Chronic kidney disease, stage 4 (severe): Secondary | ICD-10-CM

## 2018-11-11 DIAGNOSIS — E1122 Type 2 diabetes mellitus with diabetic chronic kidney disease: Secondary | ICD-10-CM

## 2018-11-11 DIAGNOSIS — I129 Hypertensive chronic kidney disease with stage 1 through stage 4 chronic kidney disease, or unspecified chronic kidney disease: Secondary | ICD-10-CM

## 2018-11-13 NOTE — Chronic Care Management (AMB) (Signed)
Chronic Care Management   Follow Up Note   11/12/2018 Name: Justin Holloway MRN: [id number] DOB: 02/08/1929  Referred by: [name], MD Reason for referral : Chronic Care Management (CCM RNCM Telephone Follow up)   Justin Holloway is a 85 y.o. year old male who is a primary care patient of [name], MD. The CCM team was consulted for assistance with chronic disease management and care coordination needs.    Review of patient status, including review of consultants reports, relevant laboratory and other test results, and collaboration with appropriate care team members and the patient's provider was performed as part of comprehensive patient evaluation and provision of chronic care management services.    SDOH (Social Determinants of Health) screening performed today: None. See Care Plan for related entries.   Advanced Directives Status: N See Care Plan and [employer] application for related entries.  CCM RN CM outbound call placed to patient for a status update on his Impaired Physical Mobility.   Outpatient Encounter Medications as of 11/11/2018  Medication Sig Note  . aspirin EC 81 MG tablet Take 81 mg by mouth every evening.   . Blood Glucose Monitoring Suppl (ONE TOUCH ULTRA 2) w/Device KIT Use as directed to check blood sugars 2 times per day dx: e11.22   . cholecalciferol (VITAMIN D) 1000 units tablet Take 1,000 Units by mouth 2 (two) times daily.   . Easy Comfort Lancets MISC USE LANCETS TO TEST BLOOD SUGAR 3 TIME(S) DAILY , ONE LANCET PER TEST   . furosemide (LASIX) 80 MG tablet Take by mouth. Take 60m in the AM and 446min the early evening.   . gabapentin (NEURONTIN) 100 MG capsule TAKE 2 CAPSULES BY MOUTH EVERY NIGHT (Patient not taking: Reported on 10/10/2018)   . hydrALAZINE (APRESOLINE) 25 MG tablet TAKE 1 TABLET BY MOUTH THREE TIMES DAILY   . Insulin Degludec (TRESIBA FLEXTOUCH) 200 UNIT/ML SOPN Inject 25 Units into the skin at bedtime.   . Marland Kitchenactulose (CEPHULAC)  10 g packet Take 1 packet (10 g total) by mouth 3 (three) times daily.   . Marland KitchenUMIGAN 0.01 % SOLN    . Multiple Vitamin (MULTIVITAMIN WITH MINERALS) TABS tablet Take 1 tablet by mouth daily.   . Glory RosebushLTRA test strip USE STRIPS TO TEST BLOOD SUGAR 3 TIME(S) DAILY, ONE STRIP PER TEST   . pravastatin (PRAVACHOL) 40 MG tablet Take 1 tablet by mouth daily   . Semaglutide (OZEMPIC) 0.25 or 0.5 MG/DOSE SOPN Inject 0.5 mg into the skin once a week. 05/16/2018: On Fridays    No facility-administered encounter medications on file as of 11/11/2018.      Goals Addressed      Patient Stated   . COMPLETED: "I have poor balance" (pt-stated)       Current Barriers:  . Marland Kitchennowledge Deficits related to treatment management for impaired physical mobility . Diabetic Neuropathy . Peripheral Vascular Disease  Nurse Case Manager Clinical Goal(s):  . Marland Kitchenver the next 90 days, patient will verbalize understanding of plan for in home PT evaluation and treatment for poor balance, strengthening and stamina. 06/28/18 Goal date re-established to 90 days due to COVID-19 treatment delays. COMPLETED  CCM RN CM Interventions:  11/12/18 call completed with patient   . Evaluation of current treatment plan related to Impaired Physical Mobility and Vertigo with ambulation and patient's adherence to plan as established by provider. . Advised patient to continue to follow the HEP as instructed by PT; discussed patient continues to experience  vertigo when ambulating with his cane, he doesn't seem to be bothered by it when using his walker but prefers to use his cane; discussed he will continue to perform the HEP and has found it to be effective for his balance and has improved his overall stamina; Confirmed Justin Holloway purchased the mercury weighted compression socks on line as instructed by the PT; he is wearing them and states the socks have reduced the swelling in his lower extremities   . Discussed plans with patient for ongoing  care management follow up and provided patient with direct contact information for care management team  Patient Self Care Activities:  . Self administers medications as prescribed . Attends all scheduled provider appointments . Calls pharmacy for medication refills . Performs ADL's independently . Performs IADL's independently . Calls provider office for new concerns or questions  Please see past updates related to this goal by clicking on the "Past Updates" button in the selected goal           Telephone follow up appointment with care management team member scheduled for: 12/17/18  Barb Merino, RN, BSN, CCM Care Management Coordinator [facility]  Direct Phone: [phone]

## 2018-11-13 NOTE — Patient Instructions (Signed)
Visit Information  Goals Addressed      Patient Stated   . COMPLETED: "I have poor balance" (pt-stated)       Current Barriers:  [name] Knowledge Deficits related to treatment management for impaired physical mobility . Diabetic Neuropathy . Peripheral Vascular Disease  Nurse Case Manager Clinical Goal(s):  [name] Over the next 90 days, patient will verbalize understanding of plan for in home PT evaluation and treatment for poor balance, strengthening and stamina. 06/28/18 Goal date re-established to 90 days due to COVID-19 treatment delays. COMPLETED  CCM RN CM Interventions:  11/12/18 call completed with patient   . Evaluation of current treatment plan related to Impaired Physical Mobility and Vertigo with ambulation and patient's adherence to plan as established by provider. . Advised patient to continue to follow the HEP as instructed by PT; discussed patient continues to experience vertigo when ambulating with his cane, he doesn't seem to be bothered by it when using his walker but prefers to use his cane; discussed he will continue to perform the HEP and has found it to be effective for his balance and has improved his overall stamina; Confirmed Mr. Anastasi purchased the mercury weighted compression socks on line as instructed by the PT; he is wearing them and states the socks have reduced the swelling in his lower extremities   . Discussed plans with patient for ongoing care management follow up and provided patient with direct contact information for care management team  Patient Self Care Activities:  . Self administers medications as prescribed . Attends all scheduled provider appointments . Calls pharmacy for medication refills . Performs ADL's independently . Performs IADL's independently . Calls provider office for new concerns or questions  Please see past updates related to this goal by clicking on the "Past Updates" button in the selected goal        The patient verbalized  understanding of instructions provided today and declined a print copy of patient instruction materials.   Telephone follow up appointment with care management team member scheduled for: 12/17/18  Barb Merino, RN, BSN, CCM Care Management Coordinator [facility] [facility]  Direct Phone: [phone]

## 2018-11-20 ENCOUNTER — Other Ambulatory Visit: Payer: Self-pay

## 2018-11-20 ENCOUNTER — Ambulatory Visit (INDEPENDENT_AMBULATORY_CARE_PROVIDER_SITE_OTHER): Payer: Medicare Other | Admitting: Internal Medicine

## 2018-11-20 ENCOUNTER — Encounter: Payer: Self-pay | Admitting: Internal Medicine

## 2018-11-20 VITALS — BP 124/82 | HR 93 | Temp 98.3°F | Ht 65.0 in | Wt 225.2 lb

## 2018-11-20 DIAGNOSIS — R2689 Other abnormalities of gait and mobility: Secondary | ICD-10-CM | POA: Diagnosis not present

## 2018-11-20 DIAGNOSIS — Z6837 Body mass index (BMI) 37.0-37.9, adult: Secondary | ICD-10-CM

## 2018-11-20 DIAGNOSIS — Z79899 Other long term (current) drug therapy: Secondary | ICD-10-CM | POA: Diagnosis not present

## 2018-11-20 DIAGNOSIS — E1122 Type 2 diabetes mellitus with diabetic chronic kidney disease: Secondary | ICD-10-CM

## 2018-11-20 DIAGNOSIS — I129 Hypertensive chronic kidney disease with stage 1 through stage 4 chronic kidney disease, or unspecified chronic kidney disease: Secondary | ICD-10-CM

## 2018-11-20 DIAGNOSIS — E0841 Diabetes mellitus due to underlying condition with diabetic mononeuropathy: Secondary | ICD-10-CM

## 2018-11-20 DIAGNOSIS — Z794 Long term (current) use of insulin: Secondary | ICD-10-CM | POA: Diagnosis not present

## 2018-11-20 DIAGNOSIS — N184 Chronic kidney disease, stage 4 (severe): Secondary | ICD-10-CM | POA: Diagnosis not present

## 2018-11-20 NOTE — Patient Instructions (Signed)
Get Vitamin B-complex, take as directed You may get this over the counter  Diabetic Neuropathy Diabetic neuropathy refers to nerve damage that is caused by diabetes (diabetes mellitus). Over time, people with diabetes can develop nerve damage throughout the body. There are several types of diabetic neuropathy:  Peripheral neuropathy. This is the most common type of diabetic neuropathy. It causes damage to nerves that carry signals between the spinal cord and other parts of the body (peripheral nerves). This usually affects nerves in the feet and legs first, and may eventually affect the hands and arms. The damage affects the ability to sense touch or temperature.  Autonomic neuropathy. This type causes damage to nerves that control involuntary functions (autonomic nerves). These nerves carry signals that control: ? Heartbeat. ? Body temperature. ? Blood pressure. ? Urination. ? Digestion. ? Sweating. ? Sexual function. ? Response to changing blood sugar (glucose) levels.  Focal neuropathy. This type of nerve damage affects one area of the body, such as an arm, a leg, or the face. The injury may involve one nerve or a small group of nerves. Focal neuropathy can be painful and unpredictable, and occurs most often in older adults with diabetes. This often develops suddenly, but usually improves over time and does not cause long-term problems.  Proximal neuropathy. This type of nerve damage affects the nerves of the thighs, hips, buttocks, or legs. It causes severe pain, weakness, and muscle death (atrophy), usually in the thigh muscles. It is more common among older men and people who have type 2 diabetes. The length of recovery time may vary. What are the causes? Peripheral, autonomic, and focal neuropathies are caused by diabetes that is not well controlled with treatment. The cause of proximal neuropathy is not known, but it may be caused by inflammation related to uncontrolled blood glucose  levels. What are the signs or symptoms? Peripheral neuropathy Peripheral neuropathy develops slowly over time. When the nerves of the feet and legs no longer work, you may experience:  Burning, stabbing, or aching pain in the legs or feet.  Pain or cramping in the legs or feet.  Loss of feeling (numbness) and inability to feel pressure or pain in the feet. This can lead to: ? Thick calluses or sores on areas of constant pressure. ? Ulcers. ? Reduced ability to feel temperature changes.  Foot deformities.  Muscle weakness.  Loss of balance or coordination. Autonomic neuropathy The symptoms of autonomic neuropathy vary depending on which nerves are affected. Symptoms may include:  Problems with digestion, such as: ? Nausea or vomiting. ? Poor appetite. ? Bloating. ? Diarrhea or constipation. ? Trouble swallowing. ? Losing weight without trying to.  Problems with the heart, blood and lungs, such as: ? Dizziness, especially when standing up. ? Fainting. ? Shortness of breath. ? Irregular heartbeat.  Bladder problems, such as: ? Trouble starting or stopping urination. ? Leaking urine. ? Trouble emptying the bladder. ? Urinary tract infections (UTIs).  Problems with other body functions, such as: ? Sweat. You may sweat too much or too little. ? Temperature. You might get hot easily. Or, you might feel cold more than usual. ? Sexual function. Men may not be able to get or maintain an erection. Women may have vaginal dryness and difficulty with arousal. Focal neuropathy Symptoms affect only one area of the body. Common symptoms include:  Numbness.  Tingling.  Burning pain.  Prickling feeling.  Very sensitive skin.  Weakness.  Inability to move (paralysis).  Muscle twitching.  Muscles getting smaller (wasting).  Poor coordination.  Double or blurred vision. Proximal neuropathy  Sudden, severe pain in the hip, thigh, or buttocks. Pain may spread from the  back into the legs (sciatica).  Pain and numbness in the arms and legs.  Tingling.  Loss of bladder control or bowel control.  Weakness and wasting of thigh muscles.  Difficulty getting up from a seated position.  Abdominal swelling.  Unexplained weight loss. How is this diagnosed? Diagnosis usually involves reviewing your medical history and any symptoms you have. Diagnosis varies depending on the type of neuropathy your health care provider suspects. Peripheral neuropathy Your health care provider will check areas that are affected by your nervous system (neurologic exam), such as your reflexes, how you move, and what you can feel. You may have other tests, such as:  Blood tests.  Removal and examination of fluid that surrounds the spinal cord (lumbar puncture).  CT scan.  MRI.  A test to check the nerves that control muscles (electromyogram, EMG).  Tests of how quickly messages pass through your nerves (nerve conduction velocity tests).  Removal of a small piece of nerve to be examined under a microscope (biopsy). Autonomic neuropathy You may have tests, such as:  Tests to measure your blood pressure and heart rate. This may include monitoring you while you are safely secured to an exam table that moves you from a lying position to an upright position (table tilt test).  Breathing tests to check your lungs.  Tests to check how food moves through the digestive system (gastric emptying tests).  Blood, sweat, or urine tests.  Ultrasound of your bladder.  Spinal fluid tests. Focal neuropathy This condition may be diagnosed with:  A neurologic exam.  CT scan.  MRI.  EMG.  Nerve conduction velocity tests. Proximal neuropathy There is no test to diagnose this type of neuropathy. You may have tests to rule out other possible causes of this type of neuropathy. Tests may include:  X-rays of your spine and lumbar region.  Lumbar puncture.  MRI. How is this  treated? The goal of treatment is to keep nerve damage from getting worse. The most important part of treatment is keeping your blood glucose level and your A1C level within your target range by following your diabetes management plan. Over time, maintaining lower blood glucose levels helps lessen symptoms. In some cases, you may need prescription pain medicine. Follow these instructions at home:  Lifestyle   Do not use any products that contain nicotine or tobacco, such as cigarettes and e-cigarettes. If you need help quitting, ask your health care provider.  Be physically active every day. Include strength training and balance exercises.  Follow a healthy meal plan.  Work with your health care provider to manage your blood pressure. General instructions  Follow your diabetes management plan as directed. ? Check your blood glucose levels as directed by your health care provider. ? Keep your blood glucose in your target range as directed by your health care provider. ? Have your A1C level checked at least two times a year, or as often as told by your health care provider.  Take over the counter and prescription medicines only as told by your health care provider. This includes insulin and diabetes medicine.  Do not drive or use heavy machinery while taking prescription pain medicines.  Check your skin and feet every day for cuts, bruises, redness, blisters, or sores.  Keep all follow up visits as told by your  health care provider. This is important. Contact a health care provider if:  You have burning, stabbing, or aching pain in your legs or feet.  You are unable to feel pressure or pain in your feet.  You develop problems with digestion, such as: ? Nausea. ? Vomiting. ? Bloating. ? Constipation. ? Diarrhea. ? Abdominal pain.  You have difficulty with urination, such as inability: ? To control when you urinate (incontinence). ? To completely empty the bladder (retention).   You have palpitations.  You feel dizzy, weak, or faint when you stand up. Get help right away if:  You cannot urinate.  You have sudden weakness or loss of coordination.  You have trouble speaking.  You have pain or pressure in your chest.  You have an irregular heart beat.  You have sudden inability to move a part of your body. Summary  Diabetic neuropathy refers to nerve damage that is caused by diabetes. It can affect nerves throughout the entire body, causing numbness and pain in the arms, legs, digestive tract, heart, and other body systems.  Keep your blood glucose level and your blood pressure in your target range, as directed by your health care provider. This can help prevent neuropathy from getting worse.  Check your skin and feet every day for cuts, bruises, redness, blisters, or sores.  Do not use any products that contain nicotine or tobacco, such as cigarettes and e-cigarettes. If you need help quitting, ask your health care provider. This information is not intended to replace advice given to you by your health care provider. Make sure you discuss any questions you have with your health care provider. Document Released: 03/06/2001 Document Revised: 02/07/2017 Document Reviewed: 01/31/2016 Elsevier Patient Education  2020 [employer].

## 2018-11-21 ENCOUNTER — Telehealth: Payer: Self-pay

## 2018-11-21 LAB — HEMOGLOBIN A1C
Est. average glucose Bld gHb Est-mCnc: 166 mg/dL
Hgb A1c MFr Bld: 7.4 % — ABNORMAL HIGH (ref 4.8–5.6)

## 2018-11-21 LAB — VITAMIN B12: Vitamin B-12: 768 pg/mL (ref 232–1245)

## 2018-11-23 DIAGNOSIS — E119 Type 2 diabetes mellitus without complications: Secondary | ICD-10-CM | POA: Diagnosis not present

## 2018-11-23 DIAGNOSIS — L84 Corns and callosities: Secondary | ICD-10-CM | POA: Diagnosis not present

## 2018-11-23 DIAGNOSIS — E1140 Type 2 diabetes mellitus with diabetic neuropathy, unspecified: Secondary | ICD-10-CM | POA: Diagnosis not present

## 2018-11-24 NOTE — Progress Notes (Signed)
Subjective:     Patient ID: Justin Holloway , male    DOB: 1929/05/21 , 85 y.o.   MRN: [id number]   Chief Complaint  Patient presents with  . Diabetes  . Hypertension    HPI  Diabetes He presents for his follow-up diabetic visit. He has type 2 diabetes mellitus. His disease course has been stable. There are no hypoglycemic associated symptoms. Pertinent negatives for diabetes include no blurred vision and no chest pain. There are no hypoglycemic complications. Risk factors for coronary artery disease include dyslipidemia, hypertension, diabetes mellitus, male sex, sedentary lifestyle and obesity. He is compliant with treatment most of the time. He is following a generally healthy diet. He participates in exercise intermittently. Eye exam is not current.  Hypertension This is a chronic problem. The current episode started more than 1 year ago. The problem is controlled. Pertinent negatives include no blurred vision, chest pain, palpitations or shortness of breath. Past treatments include diuretics. The current treatment provides moderate improvement. Hypertensive end-organ damage includes kidney disease.     Past Medical History:  Diagnosis Date  . Arthritis   . CKD (chronic kidney disease), stage III   . Diabetes mellitus without complication ([place])   . Glaucoma    "blurred vision", see Dr Gershon Crane yearly  . Hyperlipidemia   . Hypertension   . TIA (transient ischemic attack)      Family History  Problem Relation Age of Onset  . Cancer Mother 38       breast  . COPD Father 90       Congestive heart failure     Current Outpatient Medications:  .  Ascorbic Acid (VITAMIN C) 1000 MG tablet, Take 1,000 mg by mouth daily., Disp: , Rfl:  .  aspirin EC 81 MG tablet, Take 81 mg by mouth every evening., Disp: , Rfl:  .  Blood Glucose Monitoring Suppl (ONE TOUCH ULTRA 2) w/Device KIT, Use as directed to check blood sugars 2 times per day dx: e11.22, Disp: 1 each, Rfl: 1 .   cholecalciferol (VITAMIN D) 1000 units tablet, Take 2,000 Units by mouth 2 (two) times daily. , Disp: , Rfl:  .  Easy Comfort Lancets MISC, USE LANCETS TO TEST BLOOD SUGAR 3 TIME(S) DAILY , ONE LANCET PER TEST, Disp: 300 each, Rfl: 2 .  furosemide (LASIX) 80 MG tablet, Take by mouth. Take 40m in the AM and 414min the early evening., Disp: , Rfl:  .  hydrALAZINE (APRESOLINE) 25 MG tablet, TAKE 1 TABLET BY MOUTH THREE TIMES DAILY, Disp: 270 tablet, Rfl: 0 .  Insulin Degludec (TRESIBA FLEXTOUCH) 200 UNIT/ML SOPN, Inject 25 Units into the skin at bedtime., Disp: , Rfl:  .  LUMIGAN 0.01 % SOLN, , Disp: , Rfl:  .  Multiple Vitamin (MULTIVITAMIN WITH MINERALS) TABS tablet, Take 1 tablet by mouth daily., Disp: , Rfl:  .  ONETOUCH ULTRA test strip, USE STRIPS TO TEST BLOOD SUGAR 3 TIME(S) DAILY, ONE STRIP PER TEST, Disp: 100 each, Rfl: 2 .  pravastatin (PRAVACHOL) 40 MG tablet, Take 1 tablet by mouth daily, Disp: 90 tablet, Rfl: 1 .  Semaglutide (OZEMPIC) 0.25 or 0.5 MG/DOSE SOPN, Inject 0.5 mg into the skin once a week., Disp: , Rfl:  .  gabapentin (NEURONTIN) 100 MG capsule, TAKE 2 CAPSULES BY MOUTH EVERY NIGHT (Patient not taking: Reported on 11/20/2018), Disp: 180 capsule, Rfl: 1 .  lactulose (CEPHULAC) 10 g packet, Take 1 packet (10 g total) by mouth 3 (three) times  daily. (Patient not taking: Reported on 11/20/2018), Disp: 30 each, Rfl: 0   Allergies  Allergen Reactions  . Gabapentin Itching     Review of Systems  Constitutional: Negative.   Eyes: Negative for blurred vision.  Respiratory: Negative.  Negative for shortness of breath.   Cardiovascular: Negative.  Negative for chest pain and palpitations.  Gastrointestinal: Negative.   Neurological: Positive for numbness.       He c/o feeling off balance. He is unsure what triggers his sx.   Psychiatric/Behavioral: Negative.      Today's Vitals   11/20/18 1118  BP: 124/82  Pulse: 93  Temp: 98.3 F (36.8 C)  TempSrc: Oral  Weight: 225  lb 3.2 oz (102.2 kg)  Height: 5' 5"  (1.651 m)  PainSc: 0-No pain   Body mass index is 37.48 kg/m.   Objective:  Physical Exam Vitals signs and nursing note reviewed.  Constitutional:      Appearance: Normal appearance. He is obese.  Cardiovascular:     Rate and Rhythm: Normal rate and regular rhythm.     Heart sounds: Normal heart sounds.  Pulmonary:     Effort: Pulmonary effort is normal.     Breath sounds: Normal breath sounds.  Musculoskeletal:     Right lower leg: 2+ Pitting Edema present.     Left lower leg: 2+ Pitting Edema present.  Skin:    General: Skin is warm.  Neurological:     General: No focal deficit present.     Mental Status: He is alert.     Comments: Shuffling gait  Psychiatric:        Mood and Affect: Mood normal.         Assessment And Plan:     1. Type 2 diabetes mellitus with stage 4 chronic kidney disease, with long-term current use of insulin ([place])  I will check an a1c today.  He was congratulated on his efforts in improving his diet. He is encouraged to avoid sugary beverages.   - Hemoglobin A1c  2. Hypertensive nephropathy  Chronic, well controlled. He will continue with current meds. He is encouraged to avoid adding salt to his foods.   3. 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 Unfortunately, Lyrica has yet to improve his sx. I am unable to increase Lyrica dose due to CKD.   4. Loss of balance  Chronic. He has been evaluated by Neuro. He is now in PT.   5. Class 2 severe obesity due to excess calories with serious comorbidity and body mass index (BMI) of 37.0 to 37.9 in adult [facility])  He is encouraged to strive for BMI less than 32 to decrease cardiac risk. He is encouraged to perform chair exercises while watching TV.   6. Drug therapy  - Vitamin B12        [name], MD    THE PATIENT IS ENCOURAGED TO PRACTICE SOCIAL DISTANCING DUE TO THE COVID-19  PANDEMIC.

## 2018-11-26 ENCOUNTER — Ambulatory Visit: Payer: Self-pay | Admitting: Pharmacist

## 2018-11-26 DIAGNOSIS — E0841 Diabetes mellitus due to underlying condition with diabetic mononeuropathy: Secondary | ICD-10-CM

## 2018-11-26 DIAGNOSIS — E1122 Type 2 diabetes mellitus with diabetic chronic kidney disease: Secondary | ICD-10-CM

## 2018-12-01 NOTE — Progress Notes (Signed)
  Chronic Care Management   Outreach Note  11/26/2018 Name: Justin Holloway MRN: [id number] DOB: 1929-11-29  Referred by: [name], MD Reason for referral : Chronic Care Management   An unsuccessful telephone outreach was attempted today. The patient was referred to the case management team by for assistance with care management and care coordination.   Follow Up Plan: A HIPPA compliant phone message was left for the patient providing contact information and requesting a return call.  The care management team will reach out to the patient again over the next 14 days.   SIGNATURE [name], PharmD, BCPS Clinical Pharmacist, [facility] [place]: [phone]

## 2018-12-03 ENCOUNTER — Ambulatory Visit: Payer: Self-pay

## 2018-12-03 DIAGNOSIS — E1122 Type 2 diabetes mellitus with diabetic chronic kidney disease: Secondary | ICD-10-CM

## 2018-12-03 DIAGNOSIS — E0841 Diabetes mellitus due to underlying condition with diabetic mononeuropathy: Secondary | ICD-10-CM

## 2018-12-04 ENCOUNTER — Telehealth: Payer: Self-pay

## 2018-12-04 ENCOUNTER — Other Ambulatory Visit: Payer: Self-pay

## 2018-12-04 DIAGNOSIS — E0841 Diabetes mellitus due to underlying condition with diabetic mononeuropathy: Secondary | ICD-10-CM

## 2018-12-04 NOTE — Telephone Encounter (Signed)
Can you please put in referral? Thx

## 2018-12-04 NOTE — Patient Instructions (Signed)
Visit Information  Goals Addressed            This Visit's Progress     Patient Stated   . I would like to continue managing my diabetes and get my A1c <7 (pt-stated)       Current Barriers:  . Diabetes: T2DM; most recent A1c 7.4% on 11/20/18 (was 7.9% on 8/20, 9.2% on 6/1)  . Current antihyperglycemic regimen: Tresiba 25 units qHS, Ozempic once weekly sq on Fridays. One touch ultra glucometer o Ozempic refill submitted to [facility] o Will reapply for patient at next appt for PAP 2021-NovoNordisk . Denies hypoglycemic symptoms; denies hyperglycemic symptoms . Current exercise: walking as able . Current blood glucose readings: patient states FBG<120 in the AM . Cardiovascular risk reduction: o Current hypertensive regimen: hydralazine TID o Current hyperlipidemia regimen: pravastatin 40mg  daily (last filled #90 on 11/07/18) o Watch Scr and insulin/need for medication adjustments.  Renal function continues to decline.  Scr last 3.17 On 08/29/18  Pharmacist Clinical Goal(s):  [name] Over the next 90 days, patient with work with PharmD and primary care provider to address optimization of medication management of chronic conditions.  Interventions: . Comprehensive medication review performed, medication list updated in electronic medical record.  Reviewed medication fill history via insurance claims data confirming patient appears compliant with having his medications filled on time as prescribed by provider. . Reviewed & discussed the following diabetes-related information with patient: o Continue checking blood sugars as directed o Follow ADA recommended "diabetes-friendly" diet  (reviewed healthy snack/food options) o Discussed insulin/GLP-1 injection technique o Reviewed medication purpose/side effects-->patient denies adverse events, denies hypoglycemia, reports FBG<150, most recently in the upper 90s. o Continue taking all medications as prescribed by provider  Patient Self Care  Activities:  . Patient will check blood glucose daily , document, and provide at future appointments . Patient will focus on medication adherence by taking insulin and GLP-1 as prescribed. . Patient will take medications as prescribed . Patient will contact provider with any episodes of hypoglycemia . Patient will report any questions or concerns to provider   Please see past updates related to this goal by clicking on the "Past Updates" button in the selected goal       . My gabapentin is making me itch.  I would like to try a different medication for my neuropathy. (pt-stated)       Current Barriers:  . Patient reports "itching" with gabapentin therapy.  He is currently taking gabapentin 200mg  qHS.  Pharmacist Clinical Goal(s):  [name] Over the next 45 days, patient will work with PharmD and provider towards optimized medication management  CCM PharmD Interventions: 12/03/18: Call completed with patient and wife, Justin Holloway . Comprehensive medication review performed; medication list updated in electronic medical record . Discussed with patient and MD to work to find an alternative for patient's peripheral neuropathy.  Would avoid TCAs (amitriptyline) given patient's age and potential side effects.  Could consider increase dose if this is a symptom of uncontrolled neuropathy.  Patient states he has not "gotten relief" from taking gabapentin.  Could consider topical agent (lidocaine/capsacin) or low dose Lyrica capsule as therapeutic alternatives.  . [name] agreed to start Lyrica 25mg  daily (dose based on renal function). . Patient reports Lyrica is too expensive.  Lyrica has been delivered to patient's home.  Patient is requesting to see neurologist, therefore will hold on reapply or adjusting Lyrica. o Patient states Lyrica has helped, but he continues to have  burning/tingling in his toes. o He is being referred to [name] . [name]  CCM RN CM  Interventions: 10/01/18 completed call with patient  . Provided patient with an update regarding the procedure for refilling his [place] and Ozempic . Discussed embedded Pharm D [name] will follow up with him to assist prior to him running out of his medication - advised [name] will also advise him on the procedure for reapplying for assistance for the next calendar year - pt verbalizes understanding  . Sent in basket message to [name] Pharm D and requested she follow up with patient regarding his refill for his insulin and Ozempic as well as to discuss the established procedure for reapplying for financial assistance for next calendar year  Patient Self Care Activities:  . Patient will take medications as prescribed . Patient open to an alternative for his peripheral neuropathy  Please see past updates related to this goal by clicking on the "Past Updates" button in the selected goal         The patient verbalized understanding of instructions provided today and declined a print copy of patient instruction materials.   The care management team will reach out to the patient again over the next 6-8 weeks.  SIGNATURE [name], PharmD, BCPS Clinical Pharmacist, [facility] [place]: [phone]

## 2018-12-04 NOTE — Telephone Encounter (Signed)
The pt's wife Mrs. Ogas said that the pt wants to see [name] about his neuropathy.

## 2018-12-04 NOTE — Telephone Encounter (Signed)
Referral placed.

## 2018-12-04 NOTE — Progress Notes (Signed)
Chronic Care Management   Visit Note  12/03/2018 Name: Justin Holloway MRN: [id number] DOB: 07-Jul-1929  Referred by: [name], MD Reason for referral : Chronic Care Management   Justin Holloway is a 85 y.o. year old male who is a primary care patient of [name], MD. The CCM team was consulted for assistance with chronic disease management and care coordination needs related to DMII and neuropathy  Review of patient status, including review of consultants reports, relevant laboratory and other test results, and collaboration with appropriate care team members and the patient's provider was performed as part of comprehensive patient evaluation and provision of chronic care management services.    I spoke with Justin Holloway by telephone today  Medications: Outpatient Encounter Medications as of 12/03/2018  Medication Sig Note   pregabalin (LYRICA) 25 MG capsule Take 25 mg by mouth daily. 12/04/2018: Receives patient assistance   Ascorbic Acid (VITAMIN C) 1000 MG tablet Take 1,000 mg by mouth daily.    aspirin EC 81 MG tablet Take 81 mg by mouth every evening.    Blood Glucose Monitoring Suppl (ONE TOUCH ULTRA 2) w/Device KIT Use as directed to check blood sugars 2 times per day dx: e11.22    cholecalciferol (VITAMIN D) 1000 units tablet Take 2,000 Units by mouth 2 (two) times daily.     Easy Comfort Lancets MISC USE LANCETS TO TEST BLOOD SUGAR 3 TIME(S) DAILY , ONE LANCET PER TEST    furosemide (LASIX) 80 MG tablet Take by mouth. Take 64m in the AM and 470min the early evening.    hydrALAZINE (APRESOLINE) 25 MG tablet TAKE 1 TABLET BY MOUTH THREE TIMES DAILY    Insulin Degludec (TRESIBA FLEXTOUCH) 200 UNIT/ML SOPN Inject 25 Units into the skin at bedtime.    lactulose (CEPHULAC) 10 g packet Take 1 packet (10 g total) by mouth 3 (three) times daily. (Patient not taking: Reported on 11/20/2018)    LUMIGAN 0.01 % SOLN     Multiple Vitamin (MULTIVITAMIN WITH  MINERALS) TABS tablet Take 1 tablet by mouth daily.    ONETOUCH ULTRA test strip USE STRIPS TO TEST BLOOD SUGAR 3 TIME(S) DAILY, ONE STRIP PER TEST    pravastatin (PRAVACHOL) 40 MG tablet Take 1 tablet by mouth daily    Semaglutide (OZEMPIC) 0.25 or 0.5 MG/DOSE SOPN Inject 0.5 mg into the skin once a week. 05/16/2018: On Fridays    [DISCONTINUED] gabapentin (NEURONTIN) 100 MG capsule TAKE 2 CAPSULES BY MOUTH EVERY NIGHT (Patient not taking: Reported on 11/20/2018)    No facility-administered encounter medications on file as of 12/03/2018.      Objective:   Goals Addressed            This Visit's Progress     Patient Stated    I would like to continue managing my diabetes and get my A1c <7 (pt-stated)       Current Barriers:   Diabetes: T2DM; most recent A1c 7.4% on 11/20/18 (was 7.9% on 8/20, 9.2% on 6/1)   Current antihyperglycemic regimen: Tresiba 25 units qHS, Ozempic once weekly sq on Fridays. One touch ultra glucometer o Ozempic refill [facility] Will reapply for patient at next appt for PAP 2021-[employer]ovo[employer]ordisk  Denies hypoglycemic symptoms; denies hyperglycemic symptoms  Current exercise: walking as able  Current blood glucose readings: patient states FBG<120 in the AM  Cardiovascular risk reduction: o Current hypertensive regimen: hydralazine TID o Current hyperlipidemia regimen: pravastatin 4073maily (last filled #90  on 11/07/18) o Watch Scr and insulin/need for medication adjustments.  Renal function continues to decline.  Scr last 3.17 On 08/29/18  Pharmacist Clinical Goal(s):   Over the next 90 days, patient with work with PharmD and primary care provider to address optimization of medication management of chronic conditions.  Interventions:  Comprehensive medication review performed, medication list updated in electronic medical record.  Reviewed medication fill history via insurance claims data confirming patient appears compliant with having  his medications filled on time as prescribed by provider.  Reviewed & discussed the following diabetes-related information with patient: o Continue checking blood sugars as directed o Follow ADA recommended "diabetes-friendly" diet  (reviewed healthy snack/food options) o Discussed insulin/GLP-1 injection technique o Reviewed medication purpose/side effects-->patient denies adverse events, denies hypoglycemia, reports FBG<150, most recently in the upper 90s. o Continue taking all medications as prescribed by provider  Patient Self Care Activities:   Patient will check blood glucose daily , document, and provide at future appointments  Patient will focus on medication adherence by taking insulin and GLP-1 as prescribed.  Patient will take medications as prescribed  Patient will contact provider with any episodes of hypoglycemia  Patient will report any questions or concerns to provider   Please see past updates related to this goal by clicking on the "Past Updates" button in the selected goal        My gabapentin is making me itch.  I would like to try a different medication for my neuropathy. (pt-stated)       Current Barriers:   Patient reports "itching" with gabapentin therapy.  He is currently taking gabapentin 221m qHS.  Pharmacist Clinical Goal(s):   Over the next 45 days, patient will work with PharmD and provider towards optimized medication management  CCM PharmD Interventions: 12/03/18: Call completed with patient and wife, Justin Holloway Comprehensive medication review performed; medication list updated in electronic medical record  Discussed with patient and MD to work to find an alternative for patient's peripheral neuropathy.  Would avoid TCAs (amitriptyline) given patient's age and potential side effects.  Could consider increase dose if this is a symptom of uncontrolled neuropathy.  Patient states he has not "gotten relief" from taking gabapentin.  Could  consider topical agent (lidocaine/capsacin) or low dose Lyrica capsule as therapeutic alternatives.   Dr. RGlendale Chardagreed to start Lyrica 251mdaily (dose based on renal function).  Patient reports Lyrica is too expensive.  Lyrica has been delivered to patient's home.  Patient is requesting to see neurologist, therefore will hold on reapply or adjusting Lyrica. o Patient states Lyrica has helped, but he continues to have burning/tingling in his toes. o He is being referred to Dr. PlMirna MiresWill continue to follow  CCM RN CM Interventions: 10/01/18 completed call with patient   Provided patient with an update regarding the procedure for refilling his TrTyler Aasnd Ozempic  Discussed embedded Pharm D JuLottie Dawsonill follow up with him to assist prior to him running out of his medication - advised JuAlmyra Freeill also advise him on the procedure for reapplying for assistance for the next calendar year - pt verbalizes understanding   Sent in basket message to JuLottie Dawsonharm D and requested she follow up with patient regarding his refill for his insulin and Ozempic as well as to discuss the established procedure for reapplying for financial assistance for next calendar year  Patient Self Care Activities:   Patient will take medications as prescribed  Patient open  to an alternative for his peripheral neuropathy  Please see past updates related to this goal by clicking on the "Past Updates" button in the selected goal         Plan:   The care management team will reach out to the patient again over the next 6-8 weeks.  Provider Signature [name], PharmD, BCPS Clinical Pharmacist, [facility] [place]: [phone]

## 2018-12-05 ENCOUNTER — Other Ambulatory Visit: Payer: Self-pay | Admitting: Nurse Practitioner

## 2018-12-10 DIAGNOSIS — N184 Chronic kidney disease, stage 4 (severe): Secondary | ICD-10-CM | POA: Diagnosis not present

## 2018-12-10 DIAGNOSIS — H401133 Primary open-angle glaucoma, bilateral, severe stage: Secondary | ICD-10-CM | POA: Diagnosis not present

## 2018-12-12 ENCOUNTER — Telehealth: Payer: Self-pay

## 2018-12-12 ENCOUNTER — Ambulatory Visit: Payer: Self-pay

## 2018-12-12 ENCOUNTER — Other Ambulatory Visit: Payer: Self-pay

## 2018-12-12 DIAGNOSIS — E0841 Diabetes mellitus due to underlying condition with diabetic mononeuropathy: Secondary | ICD-10-CM

## 2018-12-12 DIAGNOSIS — I129 Hypertensive chronic kidney disease with stage 1 through stage 4 chronic kidney disease, or unspecified chronic kidney disease: Secondary | ICD-10-CM

## 2018-12-12 DIAGNOSIS — Z794 Long term (current) use of insulin: Secondary | ICD-10-CM

## 2018-12-12 DIAGNOSIS — E1122 Type 2 diabetes mellitus with diabetic chronic kidney disease: Secondary | ICD-10-CM

## 2018-12-16 NOTE — Patient Instructions (Signed)
Visit Information  Goals Addressed      Patient Stated   . My gabapentin is making me itch.  I would like to try a different medication for my neuropathy. (pt-stated)       Current Barriers:  . Patient reports "itching" with gabapentin therapy.  He is currently taking gabapentin 26m qHS.  Pharmacist Clinical Goal(s):  .Marland KitchenOver the next 45 days, patient will work with PharmD and provider towards optimized medication management Goal Met . New - 12/12/18 Over the next 90 days, patient will have completed his MD f/u with Dr. PMirna Miresregarding treatment recommendations for his Diabetic Neuropathy . New - 12/12/18 Over the next 90 days, patient will report improved daily glycemic control as evidence by patient will reports CBG's are consistently ranging from 80-130  CCM PharmD Interventions: 12/03/18: Call completed with patient and wife, Justin Holloway. Comprehensive medication review performed; medication list updated in electronic medical record . Discussed with patient and MD to work to find an alternative for patient's peripheral neuropathy.  Would avoid TCAs (amitriptyline) given patient's age and potential side effects.  Could consider increase dose if this is a symptom of uncontrolled neuropathy.  Patient states he has not "gotten relief" from taking gabapentin.  Could consider topical agent (lidocaine/capsacin) or low dose Lyrica capsule as therapeutic alternatives.  . Dr. RGlendale Holloway to start Lyrica 253mdaily (dose based on renal function). . Patient reports Lyrica is too expensive.  Lyrica has been delivered to patient's home.  Patient is requesting to see neurologist, therefore will hold on reapply or adjusting Lyrica. o Patient states Lyrica has helped, but he continues to have burning/tingling in his toes. o He is being referred to Dr. PlMirna Holloway [name]  CCM RN CM Interventions: 12/12/18 completed call with patient   . Evaluation of current treatment plan  related to Diabetic Neuropathy and patient's adherence to plan as established by provider. . Advised patient to contact Dr. PlMirna Justin Holloway f/u on recent referral, noted referral has been approved but patient has not received a call to schedule a new patient appointment; patient confirms patient has the contact # for Dr. PlMirna Miresnd will call for an appointment  . Provided education to patient re: treatment recommendations for Diabetic Neuropathy including pharmacological treatment as well as keeping daily glycemic numbers between 80-130   . Reviewed medications with patient and discussed patient was unable to tolerate Gabapentin; reviewed and discussed patient has an active Rx for Lyrica in place of Gabapentin; discussed the frequency, dosage and indication for use; patient will consider resuming this medication as directed  . Discussed plans with patient for ongoing care management follow up and provided patient with direct contact information for care management team  Patient Self Care Activities:  . Patient will take medications as prescribed . Patient open to an alternative for his peripheral neuropathy  Please see past updates related to this goal by clicking on the "Past Updates" button in the selected goal         The patient verbalized understanding of instructions provided today and declined a print copy of patient instruction materials.   Telephone follow up appointment with care management team member scheduled for: 01/30/19  AnBarb MerinoRN, BSN, CCM Care Management Coordinator [facility]  Direct Phone: [phone]

## 2018-12-16 NOTE — Chronic Care Management (AMB) (Signed)
Chronic Care Management   Follow Up Note   12/12/2018 Name: Justin Holloway MRN: [id number] DOB: 1929-11-16  Referred by: [name], MD Reason for referral : Chronic Care Management (CCM RNCM Telephone Follow up )   Justin Holloway is a 85 y.o. year old male who is a primary care patient of [name], MD. The CCM team was consulted for assistance with chronic disease management and care coordination needs.    Review of patient status, including review of consultants reports, relevant laboratory and other test results, and collaboration with appropriate care team members and the patient's provider was performed as part of comprehensive patient evaluation and provision of chronic care management services.    SDOH (Social Determinants of Health) screening performed today: None. See Care Plan for related entries.   Placed outbound call to patient for a CCM RN CM update concerning his Diabetic Neuropathy and referral for pain management.   Outpatient Encounter Medications as of 12/12/2018  Medication Sig Note  . Ascorbic Acid (VITAMIN C) 1000 MG tablet Take 1,000 mg by mouth daily.   [name] aspirin EC 81 MG tablet Take 81 mg by mouth every evening.   . Blood Glucose Monitoring Suppl (ONE TOUCH ULTRA 2) w/Device KIT Use as directed to check blood sugars 2 times per day dx: e11.22   . cholecalciferol (VITAMIN D) 1000 units tablet Take 2,000 Units by mouth 2 (two) times daily.    . Easy Comfort Lancets MISC USE LANCETS TO TEST BLOOD SUGAR 3 TIME(S) DAILY , ONE LANCET PER TEST   . furosemide (LASIX) 80 MG tablet Take by mouth. Take 31m in the AM and 443min the early evening.   . hydrALAZINE (APRESOLINE) 25 MG tablet TAKE 1 TABLET BY MOUTH THREE TIMES DAILY   . Insulin Degludec (TRESIBA FLEXTOUCH) 200 UNIT/ML SOPN Inject 25 Units into the skin at bedtime.   . Marland Kitchenactulose (CEPHULAC) 10 g packet Take 1 packet (10 g total) by mouth 3 (three) times daily. (Patient not taking: Reported on  11/20/2018)   . LUMIGAN 0.01 % SOLN    . Multiple Vitamin (MULTIVITAMIN WITH MINERALS) TABS tablet Take 1 tablet by mouth daily.   . Glory RosebushLTRA test strip USE STRIPS TO TEST BLOOD SUGAR 3 TIME(S) DAILY, ONE STRIP PER TEST   . pravastatin (PRAVACHOL) 40 MG tablet Take 1 tablet by mouth daily   . pregabalin (LYRICA) 25 MG capsule Take 25 mg by mouth daily. 12/04/2018: Frances Maywoodatient assistance  . Semaglutide (OZEMPIC) 0.25 or 0.5 MG/DOSE SOPN Inject 0.5 mg into the skin once a week. 05/16/2018: On Fridays    No facility-administered encounter medications on file as of 12/12/2018.      Goals Addressed      Patient Stated   . My gabapentin is making me itch.  I would like to try a different medication for my neuropathy. (pt-stated)       Current Barriers:  . Patient reports "itching" with gabapentin therapy.  He is currently taking gabapentin 20036mHS.  Pharmacist Clinical Goal(s):  . OMarland Kitchener the next 45 days, patient will work with PharmD and provider towards optimized medication management Goal Met . New - 12/12/18 Over the next 90 days, patient will have completed his MD f/u with Dr. PluMirna Miresgarding treatment recommendations for his Diabetic Neuropathy . New - 12/12/18 Over the next 90 days, patient will report improved daily glycemic control as evidence by patient will reports CBG's are consistently ranging from 80-130  CCM PharmD  Interventions: 12/03/18: Call completed with patient and wife, Justin Holloway . Comprehensive medication review performed; medication list updated in electronic medical record . Discussed with patient and MD to work to find an alternative for patient's peripheral neuropathy.  Would avoid TCAs (amitriptyline) given patient's age and potential side effects.  Could consider increase dose if this is a symptom of uncontrolled neuropathy.  Patient states he has not "gotten relief" from taking gabapentin.  Could consider topical agent (lidocaine/capsacin) or low dose  Lyrica capsule as therapeutic alternatives.  . [name] agreed to start Lyrica 32m daily (dose based on renal function). . Patient reports Lyrica is too expensive.  Lyrica has been delivered to patient's home.  Patient is requesting to see neurologist, therefore will hold on reapply or adjusting Lyrica. o Patient states Lyrica has helped, but he continues to have burning/tingling in his toes. o He is being referred to Dr. PMirna Mires. [name]  CCM RN CM Interventions: 12/12/18 completed call with patient   . Evaluation of current treatment plan related to Diabetic Neuropathy and patient's adherence to plan as established by provider. . Advised patient to contact Dr. PMirna Miresto f/u on recent referral, noted referral has been approved but patient has not received a call to schedule a new patient appointment; patient confirms patient has the contact # for Dr. PMirna Miresand will call for an appointment  . Provided education to patient re: treatment recommendations for Diabetic Neuropathy including pharmacological treatment as well as keeping daily glycemic numbers between 80-130   . Reviewed medications with patient and discussed patient was unable to tolerate Gabapentin; reviewed and discussed patient has an active Rx for Lyrica in place of Gabapentin; discussed the frequency, dosage and indication for use; patient will consider resuming this medication as directed  . Discussed plans with patient for ongoing care management follow up and provided patient with direct contact information for care management team  Patient Self Care Activities:  . Patient will take medications as prescribed . Patient open to an alternative for his peripheral neuropathy  Please see past updates related to this goal by clicking on the "Past Updates" button in the selected goal          Telephone follow up appointment with care management team member scheduled for: 01/30/19  [name] RN,  BSN, CCM Care Management Coordinator [facility]  Direct Phone: [phone]

## 2018-12-17 ENCOUNTER — Telehealth: Payer: Self-pay

## 2018-12-17 ENCOUNTER — Ambulatory Visit (INDEPENDENT_AMBULATORY_CARE_PROVIDER_SITE_OTHER): Payer: Medicare Other | Admitting: Pharmacist

## 2018-12-17 ENCOUNTER — Ambulatory Visit: Payer: Self-pay

## 2018-12-17 DIAGNOSIS — E0841 Diabetes mellitus due to underlying condition with diabetic mononeuropathy: Secondary | ICD-10-CM

## 2018-12-17 DIAGNOSIS — Z794 Long term (current) use of insulin: Secondary | ICD-10-CM

## 2018-12-17 DIAGNOSIS — E1122 Type 2 diabetes mellitus with diabetic chronic kidney disease: Secondary | ICD-10-CM

## 2018-12-17 DIAGNOSIS — I129 Hypertensive chronic kidney disease with stage 1 through stage 4 chronic kidney disease, or unspecified chronic kidney disease: Secondary | ICD-10-CM

## 2018-12-17 DIAGNOSIS — N184 Chronic kidney disease, stage 4 (severe): Secondary | ICD-10-CM

## 2018-12-18 NOTE — Chronic Care Management (AMB) (Signed)
Chronic Care Management   Follow Up Note   12/17/2018 Name: Justin Holloway MRN: [id number] DOB: [birth year]  Referred by: [name], MD Reason for referral : Chronic Care Management (CCM RNCM Telephone Follow up)   Justin Holloway is a 85 y.o. year old male who is a primary care patient of [name], MD. The CCM team was consulted for assistance with chronic disease management and care coordination needs.    Review of patient status, including review of consultants reports, relevant laboratory and other test results, and collaboration with appropriate care team members and the patient's provider was performed as part of comprehensive patient evaluation and provision of chronic care management services.    SDOH (Social Determinants of Health) screening performed today: None. See Care Plan for related entries.   Telephone collaboration with Justin Holloway regarding his need for a refill for Ozempic.   Outpatient Encounter Medications as of 12/17/2018  Medication Sig Note  . Ascorbic Acid (VITAMIN C) 1000 MG tablet Take 1,000 mg by mouth daily.   [name] aspirin EC 81 MG tablet Take 81 mg by mouth every evening.   . Blood Glucose Monitoring Suppl (ONE TOUCH ULTRA 2) w/Device KIT Use as directed to check blood sugars 2 times per day dx: e11.22   . cholecalciferol (VITAMIN D) 1000 units tablet Take 2,000 Units by mouth 2 (two) times daily.    . Easy Comfort Lancets MISC USE LANCETS TO TEST BLOOD SUGAR 3 TIME(S) DAILY , ONE LANCET PER TEST   . furosemide (LASIX) 80 MG tablet Take by mouth. Take 73m in the AM and 434min the early evening.   . hydrALAZINE (APRESOLINE) 25 MG tablet TAKE 1 TABLET BY MOUTH THREE TIMES DAILY   . Insulin Degludec (TRESIBA FLEXTOUCH) 200 UNIT/ML SOPN Inject 25 Units into the skin at bedtime.   . Marland Kitchenactulose (CEPHULAC) 10 g packet Take 1 packet (10 g total) by mouth 3 (three) times daily. (Patient not taking: Reported on 11/20/2018)   . LUMIGAN 0.01 % SOLN    .  Multiple Vitamin (MULTIVITAMIN WITH MINERALS) TABS tablet Take 1 tablet by mouth daily.   . Glory RosebushLTRA test strip USE STRIPS TO TEST BLOOD SUGAR 3 TIME(S) DAILY, ONE STRIP PER TEST   . pravastatin (PRAVACHOL) 40 MG tablet Take 1 tablet by mouth daily   . pregabalin (LYRICA) 25 MG capsule Take 25 mg by mouth daily. 12/04/2018: Frances Maywoodatient assistance  . Semaglutide (OZEMPIC) 0.25 or 0.5 MG/DOSE SOPN Inject 0.5 mg into the skin once a week. 05/16/2018: On Fridays    No facility-administered encounter medications on file as of 12/17/2018.      Goals Addressed      Patient Stated   . "I need to get my A1C under 7" (pt-stated)       Current Barriers:  . Marland Kitchennowledge Deficits related to disease process and Self Health management of Diabetes  Nurse Case Manager Clinical Goal(s):  . Marland Kitchenver the next 90 days, patient will verbalize basic understanding of Diabetes Mellitus, type 2 disease process and self health management plan as evidenced by patient will understand how to meal plan with low carbs and use portion control and the plate method for best dietary management of Diabetes. 06/28/18 re-established goal date to 90 days due to COVID-19 treatment delays. Goal Met  . 12/17/18 New - Over the next 90 days, patient will maintain and or lower his A1C < 7.4  CCM RN CM Interventions:  12/17/18 Completed call with  patient   . Evaluation of adherence to current treatment plan related to Diabetes and patient's adherence to plan as established by provider . Discussed patient is adhering to his prescribed Diabetic regimen, including following his diet and taking his prescribed medications . Discussed Justin Holloway has one Ozempic injection left to get him through this week, he would like a sample from the office if available . Collaboration with Dr. Baird Cancer and embedded Pharm D [name] regarding patient's request for a sample - discussed Justin Holloway should be able to get his refill via [employer] and  Rhea Bleacher D will follow up with Justin Holloway  . Discussed plans with patient for ongoing care management follow up and provided patient with direct contact information for care management team  Patient Self Care Activities:  . Self administers medications as prescribed . Attends all scheduled provider appointments . Calls pharmacy for medication refills . Performs ADL's independently . Performs IADL's independently . Calls provider office for new concerns or questions  Please see past updates related to this goal by clicking on the "Past Updates" button in the selected goal         Telephone follow up appointment with care management team member scheduled for: 01/30/19  Barb Merino, RN, BSN, CCM Care Management Coordinator [facility]  Direct Phone: [phone]

## 2018-12-18 NOTE — Patient Instructions (Signed)
Visit Information  Goals Addressed      Patient Stated   . "I need to get my A1C under 7" (pt-stated)       Current Barriers:  [name] Knowledge Deficits related to disease process and Self Health management of Diabetes  Nurse Case Manager Clinical Goal(s):  [name] Over the next 90 days, patient will verbalize basic understanding of Diabetes Mellitus, type 2 disease process and self health management plan as evidenced by patient will understand how to meal plan with low carbs and use portion control and the plate method for best dietary management of Diabetes. 06/28/18 re-established goal date to 90 days due to COVID-19 treatment delays. Goal Met  . 12/17/18 New - Over the next 90 days, patient will maintain and or lower his A1C < 7.4  CCM RN CM Interventions:  12/17/18 Completed call with patient   . Evaluation of adherence to current treatment plan related to Diabetes and patient's adherence to plan as established by provider . Discussed patient is adhering to his prescribed Diabetic regimen, including following his diet and taking his prescribed medications . Discussed Mr. Currier has one Ozempic injection left to get him through this week, he would like a sample from the office if available . Collaboration with Dr. Baird Cancer and embedded Pharm D [name] regarding patient's request for a sample - discussed Mr. Prins should be able to get his refill via [employer] and Rhea Bleacher D will follow up with Mr. Biel  . Discussed plans with patient for ongoing care management follow up and provided patient with direct contact information for care management team  Patient Self Care Activities:  . Self administers medications as prescribed . Attends all scheduled provider appointments . Calls pharmacy for medication refills . Performs ADL's independently . Performs IADL's independently . Calls provider office for new concerns or questions  Please see past updates related to this goal by  clicking on the "Past Updates" button in the selected goal        The patient verbalized understanding of instructions provided today and declined a print copy of patient instruction materials.   Telephone follow up appointment with care management team member scheduled for: 01/30/19  Barb Merino, RN, BSN, CCM Care Management Coordinator [place] [facility]  Direct Phone: [phone]

## 2018-12-19 ENCOUNTER — Other Ambulatory Visit: Payer: Self-pay | Admitting: Pharmacy Technician

## 2018-12-19 NOTE — Patient Outreach (Signed)
[facility]) Care Management  12/19/2018  NEO YEPIZ [birth year] [id number]                                       Medication Assistance Referral  Referral From: [facility] Embedded RPh Jenne Pane.   Medication/Company: Tyler Aas and Larna Daughters / [employer] Patient application portion:  Mailed Provider application portion: Faxed  to Dr. Johnnye Lana Provider address/fax verified via: Office website   Follow up:  Will follow up with patient in 10-14 business days to confirm application(s) have been received.  Maud Deed Chana Bode [place] Certified Pharmacy Technician [place] Management Direct Dial:[phone]

## 2018-12-20 ENCOUNTER — Telehealth: Payer: Self-pay

## 2018-12-20 DIAGNOSIS — I129 Hypertensive chronic kidney disease with stage 1 through stage 4 chronic kidney disease, or unspecified chronic kidney disease: Secondary | ICD-10-CM | POA: Diagnosis not present

## 2018-12-20 DIAGNOSIS — N184 Chronic kidney disease, stage 4 (severe): Secondary | ICD-10-CM | POA: Diagnosis not present

## 2018-12-20 DIAGNOSIS — E8770 Fluid overload, unspecified: Secondary | ICD-10-CM | POA: Diagnosis not present

## 2018-12-20 DIAGNOSIS — D631 Anemia in chronic kidney disease: Secondary | ICD-10-CM | POA: Diagnosis not present

## 2018-12-20 DIAGNOSIS — N2581 Secondary hyperparathyroidism of renal origin: Secondary | ICD-10-CM | POA: Diagnosis not present

## 2018-12-20 NOTE — Progress Notes (Signed)
Chronic Care Management   Visit Note  12/17/2018 Name: Justin Holloway MRN: [id number] DOB: 1929/04/28  Referred by: Justin Chard, MD Reason for referral : Chronic Care Management   Justin Holloway is a 85 y.o. year old male who is a primary care patient of Justin Chard, MD. The CCM team was consulted for assistance with chronic disease management and care coordination needs related to DMII and neuropathy  Review of patient status, including review of consultants reports, relevant laboratory and other test results, and collaboration with appropriate care team members and the patient's provider was performed as part of comprehensive patient evaluation and provision of chronic care management services.    I spoke with Justin Holloway by telephone today.  Medications: Outpatient Encounter Medications as of 12/17/2018  Medication Sig Note  . Ascorbic Acid (VITAMIN C) 1000 MG tablet Take 1,000 mg by mouth daily.   [name] aspirin EC 81 MG tablet Take 81 mg by mouth every evening.   . Blood Glucose Monitoring Suppl (ONE TOUCH ULTRA 2) w/Device KIT Use as directed to check blood sugars 2 times per day dx: e11.22   . cholecalciferol (VITAMIN D) 1000 units tablet Take 2,000 Units by mouth 2 (two) times daily.    . Easy Comfort Lancets MISC USE LANCETS TO TEST BLOOD SUGAR 3 TIME(S) DAILY , ONE LANCET PER TEST   . furosemide (LASIX) 80 MG tablet Take by mouth. Take 61m in the AM and 464min the early evening.   . hydrALAZINE (APRESOLINE) 25 MG tablet TAKE 1 TABLET BY MOUTH THREE TIMES DAILY   . Insulin Degludec (TRESIBA FLEXTOUCH) 200 UNIT/ML SOPN Inject 25 Units into the skin at bedtime.   . Marland Kitchenactulose (CEPHULAC) 10 g packet Take 1 packet (10 g total) by mouth 3 (three) times daily. (Patient not taking: Reported on 11/20/2018)   . LUMIGAN 0.01 % SOLN    . Multiple Vitamin (MULTIVITAMIN WITH MINERALS) TABS tablet Take 1 tablet by mouth daily.   . Glory RosebushLTRA test strip USE STRIPS TO TEST  BLOOD SUGAR 3 TIME(S) DAILY, ONE STRIP PER TEST   . pravastatin (PRAVACHOL) 40 MG tablet Take 1 tablet by mouth daily   . pregabalin (LYRICA) 25 MG capsule Take 25 mg by mouth daily. 12/04/2018: Justin Maywoodatient assistance  . Semaglutide (OZEMPIC) 0.25 or 0.5 MG/DOSE SOPN Inject 0.5 mg into the skin once a week. 05/16/2018: On Fridays    No facility-administered encounter medications on file as of 12/17/2018.     Objective:   Goals Addressed            This Visit's Progress     Patient Stated   . "I can't afford my diabetic medicines" (pt-stated)       Current Barriers:  . Marland Kitchennowledge Deficits related to resources to assist with cost of medications to help manage chronic conditions such as diabetes and neuropathy  CCM PharmD Clinical Goal(s):  . Marland Kitchenver the next 30 days, patient will work with CCM team to address needs related to cost of Ozempic and [place] Interventions:   Completed CCM PharmD telephone outreach with patient  Evaluation of current treatment plan related to pharmacological treatment for Diabetes and patient's adherence to plan as established by provider . Reviewed medications with patient-->updated medication list per patient report & dispense history . Discussed plans with patient for ongoing care management follow up and provided patient with direct contact information for care management team . Collaboration with [facility], AsEtter Sjogrenor  patient assistance programs for [employer] through [employer] (provides free pen needles as well).  Patient qualifies based on initial screening.  Medications will be delivered to PCP office once approved & shipped.   Patient Self Care Activities:   Verbalizes understanding of the education/information provided today . Self administers medications as prescribed . Attends all scheduled provider appointments . Calls pharmacy for medication refills . Performs ADL's independently . Performs IADL's  independently . Calls provider office for new concerns or questions  Please see past updates related to this goal by clicking on the "Past Updates" button in the selected goal       . My gabapentin is making me itch.  I would like to try a different medication for my chronic neuropathy. (pt-stated)       Current Barriers:  . Patient reports "itching" with gabapentin therapy.  He is currently taking gabapentin 232m qHS.  Pharmacist Clinical Goal(s):  .Marland KitchenOver the next 45 days, patient will work with PharmD and provider towards optimized medication management Goal Met . New - 12/12/18 Over the next 90 days, patient will have completed his MD f/u with Dr. PMirna Miresregarding treatment recommendations for his Diabetic Neuropathy . New - 12/12/18 Over the next 90 days, patient will report improved daily glycemic control as evidence by patient will reports CBG's are consistently ranging from 80-130  CCM PharmD Interventions: 12/17/18: Call completed with patient and wife, SDario Holloway. Comprehensive medication review performed; medication list updated in electronic medical record . Discussed with patient and MD to work to find an alternative for patient's peripheral neuropathy.  Would avoid TCAs (amitriptyline) given patient's age and potential side effects.  Could consider increase dose if this is a symptom of uncontrolled neuropathy.  Patient states he has not "gotten relief" from taking gabapentin.  Could consider topical agent (lidocaine/capsacin) or low dose Lyrica capsule as therapeutic alternatives.  . Dr. RGlendale Chardagreed to start Lyrica 223mdaily (dose based on renal function). . Patient reports Lyrica is too expensive.  Lyrica has been delivered to patient's home.  Patient is requesting to see neurologist, therefore will hold on reapply or adjusting Lyrica. o Patient states Lyrica has helped, but he continues to have some burning/tingling in his toes. o He is being referred to Dr.  PlMirna Holloway [name]  CCM RN CM Interventions: 12/12/18 completed call with patient   . Evaluation of current treatment plan related to Diabetic Neuropathy and patient's adherence to plan as established by provider. . Advised patient to contact Dr. PlMirna Mireso f/u on recent referral, noted referral has been approved but patient has not received a call to schedule a new patient appointment; patient confirms patient has the contact # for Dr. PlMirna Miresnd will call for an appointment  . Provided education to patient re: treatment recommendations for Diabetic Neuropathy including pharmacological treatment as well as keeping daily glycemic numbers between 80-130   . Reviewed medications with patient and discussed patient was unable to tolerate Gabapentin; reviewed and discussed patient has an active Rx for Lyrica in place of Gabapentin; discussed the frequency, dosage and indication for use; patient will consider resuming this medication as directed  . Discussed plans with patient for ongoing care management follow up and provided patient with direct contact information for care management team  Patient Self Care Activities:  . Patient will take medications as prescribed . Patient open to an alternative for his peripheral neuropathy  Please see past updates related  to this goal by clicking on the "Past Updates" button in the selected goal        Plan:   The care management team will reach out to the patient again over the next 6-8 weeks.  Provider Signature [name], PharmD, BCPS Clinical Pharmacist, [facility] [place]: [phone]

## 2018-12-20 NOTE — Patient Instructions (Signed)
Visit Information  Goals Addressed            This Visit's Progress     Patient Stated   . "I can't afford my diabetic medicines" (pt-stated)       Current Barriers:  [name] Knowledge Deficits related to resources to assist with cost of medications to help manage chronic conditions such as diabetes and neuropathy  CCM PharmD Clinical Goal(s):  [name] Over the next 30 days, patient will work with CCM team to address needs related to cost of Ozempic and [place].  Interventions:   Completed CCM PharmD telephone outreach with patient  Evaluation of current treatment plan related to pharmacological treatment for Diabetes and patient's adherence to plan as established by provider . Reviewed medications with patient-->updated medication list per patient report & dispense history . Discussed plans with patient for ongoing care management follow up and provided patient with direct contact information for care management team . Collaboration with [facility] CPhT, Etter Sjogren for patient assistance programs for [employer] through [employer] (provides free pen needles as well).  Patient qualifies based on initial screening.  Medications will be delivered to PCP office once approved & shipped.   Patient Self Care Activities:   Verbalizes understanding of the education/information provided today . Self administers medications as prescribed . Attends all scheduled provider appointments . Calls pharmacy for medication refills . Performs ADL's independently . Performs IADL's independently . Calls provider office for new concerns or questions  Please see past updates related to this goal by clicking on the "Past Updates" button in the selected goal       . My gabapentin is making me itch.  I would like to try a different medication for my chronic neuropathy. (pt-stated)       Current Barriers:  . Patient reports "itching" with gabapentin therapy.  He is currently taking gabapentin 238m  qHS.  Pharmacist Clinical Goal(s):  .Marland KitchenOver the next 45 days, patient will work with PharmD and provider towards optimized medication management Goal Met . New - 12/12/18 Over the next 90 days, patient will have completed his MD f/u with Dr. PMirna Miresregarding treatment recommendations for his Diabetic Neuropathy . New - 12/12/18 Over the next 90 days, patient will report improved daily glycemic control as evidence by patient will reports CBG's are consistently ranging from 80-130  CCM PharmD Interventions: 12/17/18: Call completed with patient and wife, SJoyce Leckey. Comprehensive medication review performed; medication list updated in electronic medical record . Discussed with patient and MD to work to find an alternative for patient's peripheral neuropathy.  Would avoid TCAs (amitriptyline) given patient's age and potential side effects.  Could consider increase dose if this is a symptom of uncontrolled neuropathy.  Patient states he has not "gotten relief" from taking gabapentin.  Could consider topical agent (lidocaine/capsacin) or low dose Lyrica capsule as therapeutic alternatives.  . Dr. RGlendale Chardagreed to start Lyrica 274mdaily (dose based on renal function). . Patient reports Lyrica is too expensive.  Lyrica has been delivered to patient's home.  Patient is requesting to see neurologist, therefore will hold on reapply or adjusting Lyrica. o Patient states Lyrica has helped, but he continues to have some burning/tingling in his toes. o He is being referred to Dr. PlMirna Mires [name]  CCM RN CM Interventions: 12/12/18 completed call with patient   . Evaluation of current treatment plan related to Diabetic Neuropathy and patient's adherence to plan as established by  provider. . Advised patient to contact [name] to f/u on recent referral, noted referral has been approved but patient has not received a call to schedule a new patient appointment; patient confirms  patient has the contact # for [name] and will call for an appointment  . Provided education to patient re: treatment recommendations for Diabetic Neuropathy including pharmacological treatment as well as keeping daily glycemic numbers between 80-130   . Reviewed medications with patient and discussed patient was unable to tolerate Gabapentin; reviewed and discussed patient has an active Rx for Lyrica in place of Gabapentin; discussed the frequency, dosage and indication for use; patient will consider resuming this medication as directed  . Discussed plans with patient for ongoing care management follow up and provided patient with direct contact information for care management team  Patient Self Care Activities:  . Patient will take medications as prescribed . Patient open to an alternative for his peripheral neuropathy  Please see past updates related to this goal by clicking on the "Past Updates" button in the selected goal         The patient verbalized understanding of instructions provided today and declined a print copy of patient instruction materials.   The care management team will reach out to the patient again over the next 6-8 weeks.  SIGNATURE [name], PharmD, BCPS Clinical Pharmacist, [facility] [place]: [phone]

## 2018-12-31 ENCOUNTER — Ambulatory Visit: Payer: Medicare Other | Admitting: Podiatry

## 2019-01-01 ENCOUNTER — Ambulatory Visit: Payer: Medicare Other | Admitting: Internal Medicine

## 2019-01-01 ENCOUNTER — Ambulatory Visit: Payer: Medicare Other

## 2019-01-06 ENCOUNTER — Other Ambulatory Visit: Payer: Self-pay | Admitting: Pharmacy Technician

## 2019-01-06 NOTE — Patient Outreach (Signed)
[facility]) Care Management  01/06/2019  Justin Holloway 1929-06-05 [id number]   Received patient portion(s) of patient assistance application(s) for Ozempic, Tresiba and Lyrica. Will fax completed application and required documents into [employer] for [place] and Ozempic on 01/13/19.  Faxed prescriber portion of Pfizer application to Dr. Baird Cancer for [place]. Chana Bode [place] Certified Pharmacy Technician [place] Management Direct Dial:[phone]

## 2019-01-08 ENCOUNTER — Other Ambulatory Visit: Payer: Self-pay | Admitting: Pharmacy Technician

## 2019-01-08 NOTE — Patient Outreach (Signed)
[facility]) Care Management  01/08/2019  ODESSA MORREN [birth year] [id number]   Received provider portion(s) of patient assistance application(s) for Lyrica. Faxed completed application and required documents into [employer].  Will follow up with company(ies) in 5-7 business days to check status of application(s).  Maud Deed Chana Bode [place] Certified Pharmacy Technician [place] Management Direct [phone]

## 2019-01-15 ENCOUNTER — Ambulatory Visit (INDEPENDENT_AMBULATORY_CARE_PROVIDER_SITE_OTHER): Payer: Medicare Other | Admitting: Pharmacist

## 2019-01-15 DIAGNOSIS — E1122 Type 2 diabetes mellitus with diabetic chronic kidney disease: Secondary | ICD-10-CM | POA: Diagnosis not present

## 2019-01-15 DIAGNOSIS — Z794 Long term (current) use of insulin: Secondary | ICD-10-CM | POA: Diagnosis not present

## 2019-01-15 DIAGNOSIS — I129 Hypertensive chronic kidney disease with stage 1 through stage 4 chronic kidney disease, or unspecified chronic kidney disease: Secondary | ICD-10-CM | POA: Diagnosis not present

## 2019-01-15 DIAGNOSIS — N184 Chronic kidney disease, stage 4 (severe): Secondary | ICD-10-CM | POA: Diagnosis not present

## 2019-01-15 NOTE — Progress Notes (Signed)
Chronic Care Management   Visit Note  01/15/2019 Name: Justin Holloway MRN: [id number] DOB: [birth year]  Referred by: [name], MD Reason for referral : Chronic Care Management (Diabetes)   Justin Holloway is a 86 y.o. year old male who is a primary care patient of [name], MD. The CCM team was consulted for assistance with chronic disease management and care coordination needs related to DMII  Review of patient status, including review of consultants reports, relevant laboratory and other test results, and collaboration with appropriate care team members and the patient's provider was performed as part of comprehensive patient evaluation and provision of chronic care management services.    I spoke with Justin Holloway by telephone today.  Medications: Outpatient Encounter Medications as of 01/15/2019  Medication Sig Note  . Ascorbic Acid (VITAMIN C) 1000 MG tablet Take 1,000 mg by mouth daily.   [name] aspirin EC 81 MG tablet Take 81 mg by mouth every evening.   . Blood Glucose Monitoring Suppl (ONE TOUCH ULTRA 2) w/Device KIT Use as directed to check blood sugars 2 times per day dx: e11.22   . cholecalciferol (VITAMIN D) 1000 units tablet Take 2,000 Units by mouth 2 (two) times daily.    . Easy Comfort Lancets MISC USE LANCETS TO TEST BLOOD SUGAR 3 TIME(S) DAILY , ONE LANCET PER TEST   . furosemide (LASIX) 80 MG tablet Take by mouth. Take 13m in the AM and 425min the early evening.   . hydrALAZINE (APRESOLINE) 25 MG tablet TAKE 1 TABLET BY MOUTH THREE TIMES DAILY   . Insulin Degludec (TRESIBA FLEXTOUCH) 200 UNIT/ML SOPN Inject 25 Units into the skin at bedtime.   . Marland Kitchenactulose (CEPHULAC) 10 g packet Take 1 packet (10 g total) by mouth 3 (three) times daily. (Patient not taking: Reported on 11/20/2018)   . LUMIGAN 0.01 % SOLN    . Multiple Vitamin (MULTIVITAMIN WITH MINERALS) TABS tablet Take 1 tablet by mouth daily.   . Glory RosebushLTRA test strip USE STRIPS TO TEST BLOOD SUGAR 3  TIME(S) DAILY, ONE STRIP PER TEST   . pravastatin (PRAVACHOL) 40 MG tablet Take 1 tablet by mouth daily   . pregabalin (LYRICA) 25 MG capsule Take 25 mg by mouth daily. 12/04/2018: Frances Maywoodatient assistance  . Semaglutide (OZEMPIC) 0.25 or 0.5 MG/DOSE SOPN Inject 0.5 mg into the skin once a week. 05/16/2018: On Fridays    No facility-administered encounter medications on file as of 01/15/2019.     Objective:   Goals Addressed            This Visit's Progress     Patient Stated   . I would like to continue managing my diabetes and get my A1c <7 (pt-stated)       Current Barriers:  . Diabetes: T2DM; most recent A1c 7.4% on 11/20/18 (was 7.9% on 8/20, 9.2% on 6/1)  . Current antihyperglycemic regimen: Tresiba 25 units qHS, Ozempic once weekly sq on Fridays. One touch ultra glucometer o Ozempic PAP submitted to [employer]--4 months supply arrived today 01/15/2019; awaiting Tresiba . Denies hypoglycemic symptoms; denies hyperglycemic symptoms . Current exercise: walking as able . Current blood glucose readings: patient states FBG<120 in the AM (none <70) . Cardiovascular risk reduction: o Current hypertensive regimen: hydralazine TID o Current hyperlipidemia regimen: pravastatin 4031maily (last filled #90 on 11/07/18) o Watch Scr and insulin/need for medication adjustments.  Renal function continues to decline.  Scr last 3.17 On 08/29/18  Pharmacist Clinical Goal(s):  .  Over the next 90 days, patient with work with PharmD and primary care provider to address optimization of medication management of chronic conditions.  Interventions: Call completed on 01/15/2019 . Comprehensive medication review performed, medication list updated in electronic medical record.   . Reviewed & discussed the following diabetes-related information with patient: o Follow ADA recommended "diabetes-friendly" diet  (reviewed healthy snack/food options) o Discussed insulin/GLP-1 injection technique o Reviewed  medication purpose/side effects-->patient denies adverse events  Patient Self Care Activities:  . Patient will check blood glucose daily , document, and provide at future appointments . Patient will focus on medication adherence by taking insulin and GLP-1 as prescribed. . Patient will take medications as prescribed . Patient will contact provider with any episodes of hypoglycemia . Patient will report any questions or concerns to provider   Please see past updates related to this goal by clicking on the "Past Updates" button in the selected goal          Plan:   The care management team will reach out to the patient again over the next 45 days.   Provider Signature [name], PharmD, BCPS Clinical Pharmacist, [facility] [place]: [phone]

## 2019-01-15 NOTE — Patient Instructions (Signed)
Visit Information  Goals Addressed            This Visit's Progress     Patient Stated   . I would like to continue managing my diabetes and get my A1c <7 (pt-stated)       Current Barriers:  . Diabetes: T2DM; most recent A1c 7.4% on 11/20/18 (was 7.9% on 8/20, 9.2% on 6/1)  . Current antihyperglycemic regimen: Tresiba 25 units qHS, Ozempic once weekly sq on Fridays. One touch ultra glucometer o Ozempic PAP submitted to [employer]--4 months supply arrived today 01/15/2019; awaiting Tresiba . Denies hypoglycemic symptoms; denies hyperglycemic symptoms . Current exercise: walking as able . Current blood glucose readings: patient states FBG<120 in the AM (none <70) . Cardiovascular risk reduction: o Current hypertensive regimen: hydralazine TID o Current hyperlipidemia regimen: pravastatin 40mg  daily (last filled #90 on 11/07/18) o Watch Scr and insulin/need for medication adjustments.  Renal function continues to decline.  Scr last 3.17 On 08/29/18  Pharmacist Clinical Goal(s):  [name] Over the next 90 days, patient with work with PharmD and primary care provider to address optimization of medication management of chronic conditions.  Interventions: Call completed on 01/15/2019 . Comprehensive medication review performed, medication list updated in electronic medical record.   . Reviewed & discussed the following diabetes-related information with patient: o Follow ADA recommended "diabetes-friendly" diet  (reviewed healthy snack/food options) o Discussed insulin/GLP-1 injection technique o Reviewed medication purpose/side effects-->patient denies adverse events  Patient Self Care Activities:  . Patient will check blood glucose daily , document, and provide at future appointments . Patient will focus on medication adherence by taking insulin and GLP-1 as prescribed. . Patient will take medications as prescribed . Patient will contact provider with any episodes of hypoglycemia . Patient  will report any questions or concerns to provider   Please see past updates related to this goal by clicking on the "Past Updates" button in the selected goal          The patient verbalized understanding of instructions provided today and declined a print copy of patient instruction materials.   The care management team will reach out to the patient again over the next 30 days.   SIGNATURE [name], PharmD, BCPS Clinical Pharmacist, [facility] [place]: [phone]

## 2019-01-21 ENCOUNTER — Telehealth: Payer: Self-pay

## 2019-01-21 DIAGNOSIS — H524 Presbyopia: Secondary | ICD-10-CM | POA: Diagnosis not present

## 2019-01-21 DIAGNOSIS — H401133 Primary open-angle glaucoma, bilateral, severe stage: Secondary | ICD-10-CM | POA: Diagnosis not present

## 2019-01-21 NOTE — Telephone Encounter (Signed)
Called to inform pt his medication Tyler Aas is here and ready for pick up Pt wife stated he is already on his way to pick the medication up

## 2019-01-22 ENCOUNTER — Other Ambulatory Visit: Payer: Self-pay | Admitting: Pharmacy Technician

## 2019-01-22 ENCOUNTER — Ambulatory Visit: Payer: Self-pay

## 2019-01-22 DIAGNOSIS — I129 Hypertensive chronic kidney disease with stage 1 through stage 4 chronic kidney disease, or unspecified chronic kidney disease: Secondary | ICD-10-CM

## 2019-01-22 DIAGNOSIS — E1122 Type 2 diabetes mellitus with diabetic chronic kidney disease: Secondary | ICD-10-CM

## 2019-01-22 DIAGNOSIS — N184 Chronic kidney disease, stage 4 (severe): Secondary | ICD-10-CM | POA: Diagnosis not present

## 2019-01-22 DIAGNOSIS — Z794 Long term (current) use of insulin: Secondary | ICD-10-CM | POA: Diagnosis not present

## 2019-01-22 NOTE — Patient Outreach (Addendum)
[facility]) Care Management  01/22/2019  Justin Holloway 15-Dec-1929 [id number]   Received approval confirmations sheets from [place].  Patient has been approved for [place] and [employer] thru [employer] until 12/09/2019.  Patient has been approved for Lyrica thru [place] until 01/09/2020.  Patient assistance has been completed, will remove myself from care team.  Maud Deed. Chana Bode Dunlap Certified Pharmacy Technician [place] Management Direct Dial:[phone]

## 2019-01-23 NOTE — Chronic Care Management (AMB) (Signed)
Chronic Care Management   Follow Up Note   01/22/2019 Name: Justin Holloway MRN: [id number] DOB: 12/26/1929  Referred by: [name], MD Reason for referral : Chronic Care Management (CCM RN CM Telephone Follow up )   Justin Holloway is a 86 y.o. year old male who is a primary care patient of [name], MD. The CCM team was consulted for assistance with chronic disease management and care coordination needs.    Review of patient status, including review of consultants reports, relevant laboratory and other test results, and collaboration with appropriate care team members and the patient's provider was performed as part of comprehensive patient evaluation and provision of chronic care management services.    SDOH (Social Determinants of Health) screening performed today: None. See Care Plan for related entries.   Inbound call received from Justin Holloway requesting a status update on the referral placed to [name].   Outpatient Encounter Medications as of 01/22/2019  Medication Sig Note  . Ascorbic Acid (VITAMIN C) 1000 MG tablet Take 1,000 mg by mouth daily.   Justin Holloway aspirin EC 81 MG tablet Take 81 mg by mouth every evening.   . Blood Glucose Monitoring Suppl (ONE TOUCH ULTRA 2) w/Device KIT Use as directed to check blood sugars 2 times per day dx: e11.22   . cholecalciferol (VITAMIN D) 1000 units tablet Take 2,000 Units by mouth 2 (two) times daily.    . Easy Comfort Lancets MISC USE LANCETS TO TEST BLOOD SUGAR 3 TIME(S) DAILY , ONE LANCET PER TEST   . furosemide (LASIX) 80 MG tablet Take by mouth. Take 20m in the AM and 440min the early evening.   . hydrALAZINE (APRESOLINE) 25 MG tablet TAKE 1 TABLET BY MOUTH THREE TIMES DAILY   . Insulin Degludec (TRESIBA FLEXTOUCH) 200 UNIT/ML SOPN Inject 25 Units into the skin at bedtime.   . Justin Kitchenactulose (CEPHULAC) 10 g packet Take 1 packet (10 g total) by mouth 3 (three) times daily. (Patient not taking: Reported on 11/20/2018)   .  LUMIGAN 0.01 % SOLN    . Multiple Vitamin (MULTIVITAMIN WITH MINERALS) TABS tablet Take 1 tablet by mouth daily.   . Glory RosebushLTRA test strip USE STRIPS TO TEST BLOOD SUGAR 3 TIME(S) DAILY, ONE STRIP PER TEST   . pravastatin (PRAVACHOL) 40 MG tablet Take 1 tablet by mouth daily   . pregabalin (LYRICA) 25 MG capsule Take 25 mg by mouth daily. 12/04/2018: Justin Maywoodatient assistance  . Semaglutide (OZEMPIC) 0.25 or 0.5 MG/DOSE SOPN Inject 0.5 mg into the skin once a week. 05/16/2018: On Fridays    No facility-administered encounter medications on file as of 01/22/2019.     Goals Addressed      Patient Stated   . My gabapentin is making me itch.  I would like to try a different medication for my chronic neuropathy. (pt-stated)       Current Barriers:  . Patient reports "itching" with gabapentin therapy.  He is currently taking gabapentin 20021mHS.  Pharmacist Clinical Goal(s):  . OMarland Kitchener the next 45 days, patient will work with PharmD and provider towards optimized medication management Goal Met . New - 12/12/18 Over the next 90 days, patient will have completed his MD f/u with Dr. PluMirna Miresgarding treatment recommendations for his Diabetic Neuropathy . New - 12/12/18 Over the next 90 days, patient will report improved daily glycemic control as evidence by patient will reports CBG's are consistently ranging from 80-130  CCM PharmD Interventions: 12/17/18:  Call completed with patient and wife, [name] . Comprehensive medication review performed; medication list updated in electronic medical record . Discussed with patient and MD to work to find an alternative for patient's peripheral neuropathy.  Would avoid TCAs (amitriptyline) given patient's age and potential side effects.  Could consider increase dose if this is a symptom of uncontrolled neuropathy.  Patient states he has not "gotten relief" from taking gabapentin.  Could consider topical agent (lidocaine/capsacin) or low dose Lyrica  capsule as therapeutic alternatives.  . [name] agreed to start Lyrica 73m daily (dose based on renal function). . Patient reports Lyrica is too expensive.  Lyrica has been delivered to patient's home.  Patient is requesting to see neurologist, therefore will hold on reapply or adjusting Lyrica. o Patient states Lyrica has helped, but he continues to have some burning/tingling in his toes. o He is being referred to Dr. PMirna Mires. [name]  CCM RN CM Interventions: 01/22/19 completed call with patient  . Inbound call received from Mr. KFranzestating he was told Dr. PMirna Miresdid not receive a new patient referral from Dr. SBaird Cancer. Per chart review, it is noted that Dr. SBaird Cancerentered a referral on 12/04/18 . Placed outbound call to Dr. PSi Gauloffice, spoke with LCecille Rubinwho advised the referral was received but that Dr. PMirna Mireswould like additional clinical documentation to support the referral  . Sent an in basket message to Dr. SBaird Cancerwith this notification and the fax# where supporting documentation can be sent . Placed outbound call to [name] make him aware and he verbalizes understanding  Patient Self Care Activities:  . Patient will take medications as prescribed . Patient open to an alternative for his peripheral neuropathy  Please see past updates related to this goal by clicking on the "Past Updates" button in the selected goal          Telephone follow up appointment with care management team member scheduled for: 01/30/19  [name] RN, BSN, CCM  Care Management Coordinator [place]/[facility]  Direct Phone: [phone]

## 2019-01-23 NOTE — Patient Instructions (Signed)
Visit Information  Goals Addressed      Patient Stated   . My gabapentin is making me itch.  I would like to try a different medication for my chronic neuropathy. (pt-stated)       Current Barriers:  . Patient reports "itching" with gabapentin therapy.  He is currently taking gabapentin 286m qHS.  Pharmacist Clinical Goal(s):  .Marland KitchenOver the next 45 days, patient will work with PharmD and provider towards optimized medication management Goal Met . New - 12/12/18 Over the next 90 days, patient will have completed his MD f/u with Dr. PMirna Miresregarding treatment recommendations for his Diabetic Neuropathy . New - 12/12/18 Over the next 90 days, patient will report improved daily glycemic control as evidence by patient will reports CBG's are consistently ranging from 80-130  CCM PharmD Interventions: 12/17/18: Call completed with patient and wife, SReuel Lamadrid. Comprehensive medication review performed; medication list updated in electronic medical record . Discussed with patient and MD to work to find an alternative for patient's peripheral neuropathy.  Would avoid TCAs (amitriptyline) given patient's age and potential side effects.  Could consider increase dose if this is a symptom of uncontrolled neuropathy.  Patient states he has not "gotten relief" from taking gabapentin.  Could consider topical agent (lidocaine/capsacin) or low dose Lyrica capsule as therapeutic alternatives.  . Dr. RGlendale Chardagreed to start Lyrica 22mdaily (dose based on renal function). . Patient reports Lyrica is too expensive.  Lyrica has been delivered to patient's home.  Patient is requesting to see neurologist, therefore will hold on reapply or adjusting Lyrica. o Patient states Lyrica has helped, but he continues to have some burning/tingling in his toes. o He is being referred to Dr. PlMirna Mires [name]  CCM RN CM Interventions: 01/22/19 completed call with patient  . Inbound call received from  Mr. KiKemlertating he was told Dr. PlMirna Miresid not receive a new patient referral from Dr. SaBaird Cancer Per chart review, it is noted that Dr. SaBaird Cancerntered a referral on 12/04/18 . Placed outbound call to Dr. PlSi Gaulffice, spoke with LoCecille Rubinho advised the referral was received but that Dr. PlMirna Miresould like additional clinical documentation to support the referral  . Sent an in basket message to Dr. SaBaird Cancerith this notification and the fax# where supporting documentation can be sent . Placed outbound call to Mr. KiWhildeno make him aware and he verbalizes understanding  Patient Self Care Activities:  . Patient will take medications as prescribed . Patient open to an alternative for his peripheral neuropathy  Please see past updates related to this goal by clicking on the "Past Updates" button in the selected goal         The patient verbalized understanding of instructions provided today and declined a print copy of patient instruction materials.   Telephone follow up appointment with care management team member scheduled for: 01/30/19  AnBarb MerinoRN, BSN, CCM Care Management Coordinator [place] and Shootanagement/[facility]  Direct Phone: [phone]

## 2019-01-24 ENCOUNTER — Telehealth: Payer: Self-pay

## 2019-01-29 ENCOUNTER — Telehealth: Payer: Self-pay

## 2019-01-30 ENCOUNTER — Telehealth: Payer: Self-pay

## 2019-02-01 ENCOUNTER — Ambulatory Visit: Payer: Medicare Other | Attending: Internal Medicine

## 2019-02-01 DIAGNOSIS — Z23 Encounter for immunization: Secondary | ICD-10-CM | POA: Insufficient documentation

## 2019-02-01 NOTE — Progress Notes (Signed)
   Covid-19 Vaccination Clinic  Name:  Justin Holloway    MRN: [id number] DOB: 1929/11/06  02/01/2019  [name] was observed post Covid-19 immunization for 15 minutes without incidence. He was provided with Vaccine Information Sheet and instruction to access the V-Safe system.   [name] was instructed to call 911 with any severe reactions post vaccine: [name] Difficulty breathing  . Swelling of your face and throat  . A fast heartbeat  . A bad rash all over your body  . Dizziness and weakness    Immunizations Administered    Name Date Dose VIS Date Route   Pfizer COVID-19 Vaccine 02/01/2019  1:35 PM 0.3 mL 12/20/2018 Intramuscular   Manufacturer: [place]   Lot: [id number]   [place]: [address]-8

## 2019-02-12 ENCOUNTER — Ambulatory Visit (INDEPENDENT_AMBULATORY_CARE_PROVIDER_SITE_OTHER): Payer: Medicare Other | Admitting: Pharmacist

## 2019-02-12 DIAGNOSIS — Z794 Long term (current) use of insulin: Secondary | ICD-10-CM | POA: Diagnosis not present

## 2019-02-12 DIAGNOSIS — E1141 Type 2 diabetes mellitus with diabetic mononeuropathy: Secondary | ICD-10-CM | POA: Diagnosis not present

## 2019-02-12 DIAGNOSIS — N184 Chronic kidney disease, stage 4 (severe): Secondary | ICD-10-CM

## 2019-02-12 DIAGNOSIS — E1122 Type 2 diabetes mellitus with diabetic chronic kidney disease: Secondary | ICD-10-CM

## 2019-02-13 ENCOUNTER — Other Ambulatory Visit: Payer: Self-pay | Admitting: Internal Medicine

## 2019-02-13 ENCOUNTER — Telehealth: Payer: Self-pay

## 2019-02-14 NOTE — Progress Notes (Signed)
Chronic Care Management   Visit Note  02/12/2019 Name: Justin Holloway MRN: [id number] DOB: 09/25/1929  Referred by: Justin Chard, MD Reason for referral : Chronic Care Management   Justin Holloway is a 86 y.o. year old male who is a primary care patient of Justin Chard, MD. The CCM team was consulted for assistance with chronic disease management and care coordination needs related to DMII  Review of patient status, including review of consultants reports, relevant laboratory and other test results, and collaboration with appropriate care team members and the patient's provider was performed as part of comprehensive patient evaluation and provision of chronic care management services.    Patient was contacted via telephone for today's visit regarding his diabetes and chronic care management  Medications: Outpatient Encounter Medications as of 02/12/2019  Medication Sig Note  . Ascorbic Acid (VITAMIN C) 1000 MG tablet Take 1,000 mg by mouth daily.   [name] aspirin EC 81 MG tablet Take 81 mg by mouth every evening.   . Blood Glucose Monitoring Suppl (ONE TOUCH ULTRA 2) w/Device KIT Use as directed to check blood sugars 2 times per day dx: e11.22   . cholecalciferol (VITAMIN D) 1000 units tablet Take 2,000 Units by mouth 2 (two) times daily.    . Easy Comfort Lancets MISC USE LANCETS TO TEST BLOOD SUGAR 3 TIME(S) DAILY , ONE LANCET PER TEST   . furosemide (LASIX) 80 MG tablet Take by mouth. Take 53m in the AM and 469min the early evening.   . hydrALAZINE (APRESOLINE) 25 MG tablet TAKE 1 TABLET BY MOUTH THREE TIMES DAILY   . Insulin Degludec (TRESIBA FLEXTOUCH) 200 UNIT/ML SOPN Inject 25 Units into the skin at bedtime.   . Marland Kitchenactulose (CEPHULAC) 10 g packet Take 1 packet (10 g total) by mouth 3 (three) times daily. (Patient not taking: Reported on 11/20/2018)   . LUMIGAN 0.01 % SOLN    . Multiple Vitamin (MULTIVITAMIN WITH MINERALS) TABS tablet Take 1 tablet by mouth daily.   . [name] test strip USE STRIPS TO TEST BLOOD SUGAR 3 TIME(S) DAILY, ONE STRIP PER TEST   . pravastatin (PRAVACHOL) 40 MG tablet Take 1 tablet by mouth daily   . pregabalin (LYRICA) 25 MG capsule Take 25 mg by mouth daily. 12/04/2018: Justin Maywoodatient assistance  . Semaglutide (OZEMPIC) 0.25 or 0.5 MG/DOSE SOPN Inject 0.5 mg into the skin once a week. 05/16/2018: On Fridays    No facility-administered encounter medications on file as of 02/12/2019.     Objective:   Goals Addressed            This Visit's Progress     Patient Stated   . "I can't afford my diabetic medicines" (pt-stated)       Current Barriers:  . Marland Kitchennowledge Deficits related to resources to assist with cost of medications to help manage chronic conditions such as diabetes and neuropathy  CCM PharmD Clinical Goal(s):  . Marland Kitchenver the next 30 days, patient will work with CCM team to address needs related to cost of Ozempic and [place] Interventions:   Completed CCM PharmD telephone outreach with patient on 02/12/19  Evaluation of current treatment plan related to pharmacological treatment for Diabetes and patient's adherence to plan as established by provider . Reviewed medications with patient-->updated medication list per patient report & dispense history . Discussed plans with patient for ongoing care management follow up and provided patient with direct contact information for care management team . Collaboration with  Justin Holloway, Justin Holloway for patient assistance programs for [employer] through [employer] (provides free pen needles as well).  Patient qualifies based on initial screening.  Medications will be delivered to PCP office once approved & shipped.   Patient Self Care Activities:   Verbalizes understanding of the education/information provided today . Self administers medications as prescribed . Attends all scheduled provider appointments . Calls pharmacy for medication refills . Performs ADL's  independently . Performs IADL's independently . Calls provider office for new concerns or questions  Please see past updates related to this goal by clicking on the "Past Updates" button in the selected goal       . My gabapentin is making me itch.  I would like to try a different medication for my chronic neuropathy. (pt-stated)       Current Barriers:  . Patient reports "itching" with gabapentin therapy.  He is currently taking gabapentin 2106m qHS.  Pharmacist Clinical Goal(s):  .Marland KitchenOver the next 45 days, patient will work with PharmD and provider towards optimized medication management Goal Met . New - 12/12/18 Over the next 90 days, patient will have completed his MD f/u with Dr. PMirna Miresregarding treatment recommendations for his Diabetic Neuropathy . New - 12/12/18 Over the next 90 days, patient will report improved daily glycemic control as evidence by patient will reports CBG's are consistently ranging from 80-130  CCM PharmD Interventions: 02/11/19: Call completed with patient and wife, Justin Holloway. Comprehensive medication review performed; medication list updated in electronic medical record . Discussed with patient and MD to work to find an alternative for patient's peripheral neuropathy.  Would avoid TCAs (amitriptyline) given patient's age and potential side effects.  Could consider increase dose if this is a symptom of uncontrolled neuropathy.  Patient states he has not "gotten relief" from taking gabapentin.  Could consider topical agent (lidocaine/capsacin) or low dose Lyrica capsule as therapeutic alternatives.  . Lyrica 22mdaily (dose based on renal function) . Patient reports Lyrica is too expensive.  Lyrica has been delivered to patient's home.  Patient is requesting to see neurologist, therefore will hold on reapply or adjusting Lyrica. o Patient states Lyrica has helped "only a little bit", but he continues to have some burning/tingling in his toes. o He is being  referred to Dr. PlAreatha Keaslaced and number given to patient wife to call/set up appt on 02/11/19 . [name]  CCM RN CM Interventions: 01/22/19 completed call with patient  . Inbound call received from Mr. KiBrartating he was told Dr. PlMirna Miresid not receive a new patient referral from Dr. SaBaird Cancer Per chart review, it is noted that Dr. SaBaird Cancerntered a referral on 12/04/18 . Placed outbound call to Dr. PlSi Gaulffice, spoke with LoCecille Rubinho advised the referral was received but that Dr. PlMirna Miresould like additional clinical documentation to support the referral  . Sent an in basket message to Dr. SaBaird Cancerith this notification and the fax# where supporting documentation can be sent . Placed outbound call to Mr. KiLoho make him aware and he verbalizes understanding  Patient Self Care Activities:  . Patient will take medications as prescribed . Patient open to an alternative for his peripheral neuropathy  Please see past updates related to this goal by clicking on the "Past Updates" button in the selected goal          Plan:   The care management team will reach out to the patient again  over the next 60 days.   Provider Signature [name], PharmD, BCPS Clinical Pharmacist, [facility] [place]: [phone]

## 2019-02-14 NOTE — Patient Instructions (Signed)
Visit Information  Goals Addressed            This Visit's Progress     Patient Stated   . "I can't afford my diabetic medicines" (pt-stated)       Current Barriers:  [name] Knowledge Deficits related to resources to assist with cost of medications to help manage chronic conditions such as diabetes and neuropathy  CCM PharmD Clinical Goal(s):  [name] Over the next 30 days, patient will work with CCM team to address needs related to cost of Ozempic and [place].  Interventions:   Completed CCM PharmD telephone outreach with patient on 02/12/19  Evaluation of current treatment plan related to pharmacological treatment for Diabetes and patient's adherence to plan as established by provider . Reviewed medications with patient-->updated medication list per patient report & dispense history . Discussed plans with patient for ongoing care management follow up and provided patient with direct contact information for care management team . Collaboration with [facility] CPhT, Etter Sjogren for patient assistance programs for [employer] through [employer] (provides free pen needles as well).  Patient qualifies based on initial screening.  Medications will be delivered to PCP office once approved & shipped.   Patient Self Care Activities:   Verbalizes understanding of the education/information provided today . Self administers medications as prescribed . Attends all scheduled provider appointments . Calls pharmacy for medication refills . Performs ADL's independently . Performs IADL's independently . Calls provider office for new concerns or questions  Please see past updates related to this goal by clicking on the "Past Updates" button in the selected goal       . My gabapentin is making me itch.  I would like to try a different medication for my chronic neuropathy. (pt-stated)       Current Barriers:  . Patient reports "itching" with gabapentin therapy.  He is currently taking gabapentin 213m  qHS.  Pharmacist Clinical Goal(s):  .Marland KitchenOver the next 45 days, patient will work with PharmD and provider towards optimized medication management Goal Met . New - 12/12/18 Over the next 90 days, patient will have completed his MD f/u with Dr. PMirna Miresregarding treatment recommendations for his Diabetic Neuropathy . New - 12/12/18 Over the next 90 days, patient will report improved daily glycemic control as evidence by patient will reports CBG's are consistently ranging from 80-130  CCM PharmD Interventions: 02/11/19: Call completed with patient and wife, SSeydina Holliman. Comprehensive medication review performed; medication list updated in electronic medical record . Discussed with patient and MD to work to find an alternative for patient's peripheral neuropathy.  Would avoid TCAs (amitriptyline) given patient's age and potential side effects.  Could consider increase dose if this is a symptom of uncontrolled neuropathy.  Patient states he has not "gotten relief" from taking gabapentin.  Could consider topical agent (lidocaine/capsacin) or low dose Lyrica capsule as therapeutic alternatives.  . Lyrica 248mdaily (dose based on renal function) . Patient reports Lyrica is too expensive.  Lyrica has been delivered to patient's home.  Patient is requesting to see neurologist, therefore will hold on reapply or adjusting Lyrica. o Patient states Lyrica has helped "only a little bit", but he continues to have some burning/tingling in his toes. o He is being referred to Dr. PlAreatha Keaslaced and number given to patient wife to call/set up appt on 02/11/19 . [name]  CCM RN CM Interventions: 01/22/19 completed call with patient  . Inbound call received from Mr.  Giel stating he was told [name] did not receive a new patient referral from Dr. Baird Cancer . Per chart review, it is noted that Dr. Baird Cancer entered a referral on 12/04/18 . Placed outbound call to Dr. Si Gaul office, spoke  with Cecille Rubin who advised the referral was received but that [name] would like additional clinical documentation to support the referral  . Sent an in basket message to Dr. Baird Cancer with this notification and the fax# where supporting documentation can be sent . Placed outbound call to Mr. Cominsky to make him aware and he verbalizes understanding  Patient Self Care Activities:  . Patient will take medications as prescribed . Patient open to an alternative for his peripheral neuropathy  Please see past updates related to this goal by clicking on the "Past Updates" button in the selected goal         The patient verbalized understanding of instructions provided today and declined a print copy of patient instruction materials.   The care management team will reach out to the patient again over the next 60 days.   SIGNATURE [name], PharmD, BCPS Clinical Pharmacist, [facility] [place]: [phone]

## 2019-02-18 ENCOUNTER — Encounter: Payer: Self-pay | Admitting: Podiatry

## 2019-02-18 ENCOUNTER — Other Ambulatory Visit: Payer: Self-pay

## 2019-02-18 ENCOUNTER — Ambulatory Visit: Payer: Medicare Other | Admitting: Podiatry

## 2019-02-18 VITALS — BP 156/80 | HR 92

## 2019-02-18 DIAGNOSIS — E1142 Type 2 diabetes mellitus with diabetic polyneuropathy: Secondary | ICD-10-CM

## 2019-02-18 DIAGNOSIS — E1140 Type 2 diabetes mellitus with diabetic neuropathy, unspecified: Secondary | ICD-10-CM | POA: Insufficient documentation

## 2019-02-18 DIAGNOSIS — E119 Type 2 diabetes mellitus without complications: Secondary | ICD-10-CM

## 2019-02-18 NOTE — Progress Notes (Addendum)
Complaint:  Visit Type: Patient returns to my office for continued preventative foot care services. Complaint: Patient states" my nails have grown long and thick and become painful to walk and wear shoes" Patient has been diagnosed with DM with neuropathy.  Patient is taking Lyrica.. The patient presents for preventative foot care services. Patient has not been seen for many months.  Podiatric Exam: Vascular: dorsalis pedis and posterior tibial pulses are weakly  palpable bilateral. Capillary return is immediate. Temperature gradient is WNL. Skin turgor WNL Mild swelling  B/L. Sensorium: Normal Semmes Weinstein monofilament test. Normal tactile sensation bilaterally. Nail Exam: Pt has thick disfigured discolored nails with subungual debris noted bilateral entire nail hallux through fifth toenails Ulcer Exam: There is no evidence of ulcer or pre-ulcerative changes or infection. Orthopedic Exam: Muscle tone and strength are WNL. No limitations in general ROM. No crepitus or effusions noted. Foot type and digits show no abnormalities. Bony prominences are unremarkable. Skin: No Porokeratosis. No infection or ulcers  Diagnosis:  Onychomycosis, , Pain in right toe, pain in left toes  Treatment & Plan Procedures and Treatment: Consent by patient was obtained for treatment procedures.   Debridement of mycotic and hypertrophic toenails, 1 through 5 bilateral and clearing of subungual debris. No ulceration, no infection noted.  Return Visit-Office Procedure: Patient instructed to return to the office for a follow up visit 3 months for continued evaluation and treatment.    Gardiner Barefoot DPM

## 2019-02-21 ENCOUNTER — Telehealth: Payer: Self-pay

## 2019-02-22 ENCOUNTER — Ambulatory Visit: Payer: Medicare Other

## 2019-02-26 ENCOUNTER — Encounter: Payer: Self-pay | Admitting: Internal Medicine

## 2019-02-26 ENCOUNTER — Other Ambulatory Visit: Payer: Self-pay

## 2019-02-26 ENCOUNTER — Ambulatory Visit (INDEPENDENT_AMBULATORY_CARE_PROVIDER_SITE_OTHER): Payer: Medicare Other | Admitting: Internal Medicine

## 2019-02-26 ENCOUNTER — Ambulatory Visit: Payer: Self-pay | Admitting: Pharmacist

## 2019-02-26 VITALS — BP 110/64 | HR 102 | Temp 98.0°F | Ht 65.0 in | Wt 218.4 lb

## 2019-02-26 DIAGNOSIS — E1141 Type 2 diabetes mellitus with diabetic mononeuropathy: Secondary | ICD-10-CM | POA: Diagnosis not present

## 2019-02-26 DIAGNOSIS — N184 Chronic kidney disease, stage 4 (severe): Secondary | ICD-10-CM

## 2019-02-26 DIAGNOSIS — Z794 Long term (current) use of insulin: Secondary | ICD-10-CM

## 2019-02-26 DIAGNOSIS — E1122 Type 2 diabetes mellitus with diabetic chronic kidney disease: Secondary | ICD-10-CM | POA: Diagnosis not present

## 2019-02-26 DIAGNOSIS — R350 Frequency of micturition: Secondary | ICD-10-CM

## 2019-02-26 DIAGNOSIS — R8290 Unspecified abnormal findings in urine: Secondary | ICD-10-CM

## 2019-02-26 DIAGNOSIS — I129 Hypertensive chronic kidney disease with stage 1 through stage 4 chronic kidney disease, or unspecified chronic kidney disease: Secondary | ICD-10-CM

## 2019-02-26 DIAGNOSIS — E0841 Diabetes mellitus due to underlying condition with diabetic mononeuropathy: Secondary | ICD-10-CM | POA: Diagnosis not present

## 2019-02-26 LAB — POCT URINALYSIS DIPSTICK
Bilirubin, UA: NEGATIVE
Blood, UA: NEGATIVE
Glucose, UA: NEGATIVE
Nitrite, UA: NEGATIVE
Protein, UA: POSITIVE — AB
Spec Grav, UA: 1.02 (ref 1.010–1.025)
Urobilinogen, UA: 0.2 E.U./dL
pH, UA: 5.5 (ref 5.0–8.0)

## 2019-02-26 NOTE — Patient Instructions (Signed)
Exercises To Do While Sitting  Exercises that you do while sitting (chair exercises) can give you many of the same benefits as full exercise. Benefits include strengthening your heart, burning calories, and keeping muscles and joints healthy. Exercise can also improve your mood and help with depression and anxiety. You may benefit from chair exercises if you are unable to do standing exercises because of:  Diabetic foot pain.  Obesity.  Illness.  Arthritis.  Recovery from surgery or injury.  Breathing problems.  Balance problems.  Another type of disability. Before starting chair exercises, check with your health care provider or a physical therapist to find out how much exercise you can tolerate and which exercises are safe for you. If your health care provider approves:  Start out slowly and build up over time. Aim to work up to about 10-20 minutes for each exercise session.  Make exercise part of your daily routine.  Drink water when you exercise. Do not wait until you are thirsty. Drink every 10-15 minutes.  Stop exercising right away if you have pain, nausea, shortness of breath, or dizziness.  If you are exercising in a wheelchair, make sure to lock the wheels.  Ask your health care provider whether you can do tai chi or yoga. Many positions in these mind-body exercises can be modified to do while seated. Warm-up Before starting other exercises: 1. Sit up as straight as you can. Have your knees bent at 90 degrees, which is the shape of the capital letter "L." Keep your feet flat on the floor. 2. Sit at the front edge of your chair, if you can. 3. Pull in (tighten) the muscles in your abdomen and stretch your spine and neck as straight as you can. Hold this position for a few minutes. 4. Breathe in and out evenly. Try to concentrate on your breathing, and relax your mind. Stretching Exercise A: Arm stretch 1. Hold your arms out straight in front of your body. 2. Bend  your hands at the wrist with your fingers pointing up, as if signaling someone to stop. Notice the slight tension in your forearms as you hold the position. 3. Keeping your arms out and your hands bent, rotate your hands outward as far as you can and hold this stretch. Aim to have your thumbs pointing up and your pinkie fingers pointing down. Slowly repeat arm stretches for one minute as tolerated. Exercise B: Leg stretch 1. If you can move your legs, try to "draw" letters on the floor with the toes of your foot. Write your name with one foot. 2. Write your name with the toes of your other foot. Slowly repeat the movements for one minute as tolerated. Exercise C: Reach for the sky 1. Reach your hands as far over your head as you can to stretch your spine. 2. Move your hands and arms as if you are climbing a rope. Slowly repeat the movements for one minute as tolerated. Range of motion exercises Exercise A: Shoulder roll 1. Let your arms hang loosely at your sides. 2. Lift just your shoulders up toward your ears, then let them relax back down. 3. When your shoulders feel loose, rotate your shoulders in backward and forward circles. Do shoulder rolls slowly for one minute as tolerated. Exercise B: March in place 1. As if you are marching, pump your arms and lift your legs up and down. Lift your knees as high as you can. ? If you are unable to lift your knees,  just pump your arms and move your ankles and feet up and down. March in place for one minute as tolerated. Exercise C: Seated jumping jacks 1. Let your arms hang down straight. 2. Keeping your arms straight, lift them up over your head. Aim to point your fingers to the ceiling. 3. While you lift your arms, straighten your legs and slide your heels along the floor to your sides, as wide as you can. 4. As you bring your arms back down to your sides, slide your legs back together. ? If you are unable to use your legs, just move your  arms. Slowly repeat seated jumping jacks for one minute as tolerated. Strengthening exercises Exercise A: Shoulder squeeze 1. Hold your arms straight out from your body to your sides, with your elbows bent and your fists pointed at the ceiling. 2. Keeping your arms in the bent position, move them forward so your elbows and forearms meet in front of your face. 3. Open your arms back out as wide as you can with your elbows still bent, until you feel your shoulder blades squeezing together. Hold for 5 seconds. Slowly repeat the movements forward and backward for one minute as tolerated. Contact a health care provider if you:  Had to stop exercising due to any of the following: ? Pain. ? Nausea. ? Shortness of breath. ? Dizziness. ? Fatigue.  Have significant pain or soreness after exercising. Get help right away if you have:  Chest pain.  Difficulty breathing. These symptoms may represent a serious problem that is an emergency. Do not wait to see if the symptoms will go away. Get medical help right away. Call your local emergency services (911 in the [place] Do not drive yourself to the hospital. This information is not intended to replace advice given to you by your health care provider. Make sure you discuss any questions you have with your health care provider. Document Revised: 04/18/2018 Document Reviewed: 11/08/2016 Elsevier Patient Education  2020 [employer].

## 2019-02-27 LAB — CMP14+EGFR
ALT: 12 IU/L (ref 0–44)
AST: 24 IU/L (ref 0–40)
Albumin/Globulin Ratio: 1.4 (ref 1.2–2.2)
Albumin: 4.1 g/dL (ref 3.6–4.6)
Alkaline Phosphatase: 73 IU/L (ref 39–117)
BUN/Creatinine Ratio: 10 (ref 10–24)
BUN: 29 mg/dL — ABNORMAL HIGH (ref 8–27)
Bilirubin Total: 0.3 mg/dL (ref 0.0–1.2)
CO2: 20 mmol/L (ref 20–29)
Calcium: 9.5 mg/dL (ref 8.6–10.2)
Chloride: 103 mmol/L (ref 96–106)
Creatinine, Ser: 3.02 mg/dL — ABNORMAL HIGH (ref 0.76–1.27)
GFR calc Af Amer: 20 mL/min/{1.73_m2} — ABNORMAL LOW (ref >59)
GFR calc non Af Amer: 18 mL/min/{1.73_m2} — ABNORMAL LOW (ref >59)
Globulin, Total: 2.9 g/dL (ref 1.5–4.5)
Glucose: 68 mg/dL (ref 65–99)
Potassium: 5 mmol/L (ref 3.5–5.2)
Sodium: 139 mmol/L (ref 134–144)
Total Protein: 7 g/dL (ref 6.0–8.5)

## 2019-02-27 LAB — LIPID PANEL
Chol/HDL Ratio: 3 ratio (ref 0.0–5.0)
Cholesterol, Total: 173 mg/dL (ref 100–199)
HDL: 58 mg/dL (ref >39)
LDL Chol Calc (NIH): 102 mg/dL — ABNORMAL HIGH (ref 0–99)
Triglycerides: 69 mg/dL (ref 0–149)
VLDL Cholesterol Cal: 13 mg/dL (ref 5–40)

## 2019-02-27 LAB — HEMOGLOBIN A1C
Est. average glucose Bld gHb Est-mCnc: 154 mg/dL
Hgb A1c MFr Bld: 7 % — ABNORMAL HIGH (ref 4.8–5.6)

## 2019-02-28 ENCOUNTER — Telehealth: Payer: Self-pay

## 2019-02-28 NOTE — Telephone Encounter (Signed)
-----   Message from [name], MD sent at 02/27/2019  8:52 PM EST ----- Here are your lab results:  Your kidney function is stable. When is your next appt with Kidney doctor? Your hba1c is great at 7.0! This is wonderful!  Your LDL, bad cholesterol is 102. Ideally, this should be less than 70 as someone with diabetes. Have you been taking cholesterol medication daily?   Please let me know if you have any questions or concerns. Stay safe!   Sincerely,    Robyn N. Baird Cancer, MD

## 2019-02-28 NOTE — Progress Notes (Signed)
This visit occurred during the SARS-CoV-2 public health emergency.  Safety protocols were in place, including screening questions prior to the visit, additional usage of staff PPE, and extensive cleaning of exam room while observing appropriate contact time as indicated for disinfecting solutions.  Subjective:     Patient ID: Justin Holloway , male    DOB: 07-25-1929 , 86 y.o.   MRN: [id number]   Chief Complaint  Patient presents with  . Diabetes  . Hypertension    HPI  Diabetes He presents for his follow-up diabetic visit. He has type 2 diabetes mellitus. His disease course has been stable. There are no hypoglycemic associated symptoms. Pertinent negatives for diabetes include no blurred vision and no chest pain. There are no hypoglycemic complications. Risk factors for coronary artery disease include dyslipidemia, hypertension, diabetes mellitus, male sex, sedentary lifestyle and obesity. He is compliant with treatment most of the time. He is following a generally healthy diet. He participates in exercise intermittently. Eye exam is not current.  Hypertension This is a chronic problem. The current episode started more than 1 year ago. The problem is controlled. Pertinent negatives include no blurred vision, chest pain, palpitations or shortness of breath. Past treatments include diuretics. The current treatment provides moderate improvement. Hypertensive end-organ damage includes kidney disease.     Past Medical History:  Diagnosis Date  . Arthritis   . CKD (chronic kidney disease), stage III   . Diabetes mellitus without complication ([place])   . Glaucoma    "blurred vision", see Dr Gershon Crane yearly  . Hyperlipidemia   . Hypertension   . TIA (transient ischemic attack)      Family History  Problem Relation Age of Onset  . Cancer Mother 65       breast  . COPD Father 52       Congestive heart failure     Current Outpatient Medications:  .  Ascorbic Acid (VITAMIN C) 1000 MG  tablet, Take 1,000 mg by mouth daily., Disp: , Rfl:  .  aspirin EC 81 MG tablet, Take 81 mg by mouth every evening., Disp: , Rfl:  .  Blood Glucose Monitoring Suppl (ONE TOUCH ULTRA 2) w/Device KIT, Use as directed to check blood sugars 2 times per day dx: e11.22, Disp: 1 each, Rfl: 1 .  cholecalciferol (VITAMIN D) 1000 units tablet, Take 2,000 Units by mouth 2 (two) times daily. , Disp: , Rfl:  .  Easy Comfort Lancets MISC, USE LANCETS TO TEST BLOOD SUGAR 3 TIME(S) DAILY , ONE LANCET PER TEST, Disp: 300 each, Rfl: 2 .  furosemide (LASIX) 80 MG tablet, Take by mouth. Take 79m in the AM and 457min the early evening., Disp: , Rfl:  .  hydrALAZINE (APRESOLINE) 25 MG tablet, TAKE 1 TABLET BY MOUTH THREE TIMES DAILY, Disp: 270 tablet, Rfl: 0 .  Insulin Degludec (TRESIBA FLEXTOUCH) 200 UNIT/ML SOPN, Inject 25 Units into the skin at bedtime., Disp: , Rfl:  .  lactulose (CEPHULAC) 10 g packet, Take 1 packet (10 g total) by mouth 3 (three) times daily., Disp: 30 each, Rfl: 0 .  LUMIGAN 0.01 % SOLN, , Disp: , Rfl:  .  meclizine (ANTIVERT) 12.5 MG tablet, TAKE 1 TABLET BY MOUTH THREE TIMES DAILY AS NEEDED FOR DIZZINESS, Disp: 20 tablet, Rfl: 0 .  Multiple Vitamin (MULTIVITAMIN WITH MINERALS) TABS tablet, Take 1 tablet by mouth daily., Disp: , Rfl:  .  ONETOUCH ULTRA test strip, USE STRIPS TO TEST BLOOD SUGAR 3 TIME(S) DAILY,  ONE STRIP PER TEST, Disp: 100 each, Rfl: 2 .  pravastatin (PRAVACHOL) 40 MG tablet, Take 1 tablet by mouth daily, Disp: 90 tablet, Rfl: 1 .  pregabalin (LYRICA) 25 MG capsule, Take 25 mg by mouth daily., Disp: , Rfl:  .  Semaglutide (OZEMPIC) 0.25 or 0.5 MG/DOSE SOPN, Inject 0.5 mg into the skin once a week., Disp: , Rfl:    Allergies  Allergen Reactions  . Gabapentin Itching     Review of Systems  Constitutional: Negative.   Eyes: Negative for blurred vision.  Respiratory: Negative.  Negative for shortness of breath.   Cardiovascular: Negative.  Negative for chest pain and  palpitations.  Gastrointestinal: Negative.   Genitourinary: Positive for frequency.       He c/o urinary frequency. Wants to stop water pills. States he goes to bathroom too much. He denies urinary urgency and dysuria.   Neurological: Negative.        He is still having bd neuropathic pain in his feet. He would like to go to Pain Clinic for treatment.   Psychiatric/Behavioral: Negative.      Today's Vitals   02/26/19 0958  BP: 110/64  Pulse: (!) 102  Temp: 98 F (36.7 C)  TempSrc: Oral  Weight: 218 lb 6.4 oz (99.1 kg)  Height: 5' 5"  (1.651 m)   Body mass index is 36.34 kg/m.   Objective:  Physical Exam Vitals and nursing note reviewed.  Constitutional:      Appearance: Normal appearance.  Cardiovascular:     Rate and Rhythm: Normal rate and regular rhythm.     Heart sounds: Normal heart sounds.  Pulmonary:     Effort: Pulmonary effort is normal.     Breath sounds: Normal breath sounds.  Musculoskeletal:     Right lower leg: 1+ Pitting Edema present.     Left lower leg: 1+ Pitting Edema present.  Skin:    General: Skin is warm.  Neurological:     General: No focal deficit present.     Mental Status: He is alert.  Psychiatric:        Mood and Affect: Mood normal.         Assessment And Plan:     1. Type 2 diabetes mellitus with stage 4 chronic kidney disease, with long-term current use of insulin (HCC)  Chronic, I will check labs as listed below.  Importance of dietary compliance was discussed with the patient. I will adjust meds as needed.   - CMP14+EGFR - Hemoglobin A1c - Lipid panel  2. Hypertensive nephropathy  Chronic, well controlled. He will continue with current meds. He is encouraged to avoid adding salt to his foods.   3. 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 Unfortunately, he has not had any relief with Lyrica OR gabapentin.   - Ambulatory referral to Pain Clinic  4. Urinary  frequency  I will check urinalysis to r/o UTI. Pt advised that it is not advisable to adjust diuretic use at this time. He is aware there may be other reasons for his urinary frequency.   - POCT Urinalysis Dipstick (81002)  5. Abnormal urine  U/a abnormal, will send off urine culture.   - Culture, Urine       [name], MD    THE PATIENT IS ENCOURAGED TO PRACTICE SOCIAL DISTANCING DUE TO THE COVID-19 PANDEMIC.

## 2019-03-02 LAB — URINE CULTURE

## 2019-03-03 ENCOUNTER — Ambulatory Visit: Payer: Medicare Other

## 2019-03-03 NOTE — Patient Instructions (Signed)
Visit Information  Goals Addressed            This Visit's Progress     Patient Stated   . "I can't afford my diabetic medicines" (pt-stated)       Current Barriers:  [name] Knowledge Deficits related to resources to assist with cost of medications to help manage chronic conditions such as diabetes and neuropathy  CCM PharmD Clinical Goal(s):  [name] Over the next 30 days, patient will work with CCM team to address needs related to cost of Ozempic and [place].  Interventions:   Completed CCM PharmD telephone outreach with patient on 02/12/19  Evaluation of current treatment plan related to pharmacological treatment for Diabetes and patient's adherence to plan as established by provider . Reviewed medications with patient-->updated medication list per patient report & dispense history . Discussed plans with patient for ongoing care management follow up and provided patient with direct contact information for care management team . Collaboration with [facility] CPhT, Etter Sjogren for patient assistance programs for [employer] through [employer] (provides free pen needles as well).  Patient qualifies based on initial screening.  Medications will be delivered to PCP office once approved & shipped. . 02/26/19--patient approved for [employer] (ozempic and tresiba) patient picked up 36-month supply.  Refill request will need to be sent in April 2021   Patient Self Care Activities:   Rosezena Sensor understanding of the education/information provided today . Self administers medications as prescribed . Attends all scheduled provider appointments . Calls pharmacy for medication refills . Performs ADL's independently . Performs IADL's independently . Calls provider office for new concerns or questions  Please see past updates related to this goal by clicking on the "Past Updates" button in the selected goal       . I would like to continue managing my diabetes and get my A1c <7 (pt-stated)        Current Barriers:  . Diabetes: T2DM; most recent A1c 7.0% on 02/26/19 (was 7.9% on 8/20, 9.2% on 06/10/2018)  . Current antihyperglycemic regimen: Tresiba 25 units qHS, Ozempic once weekly sq on Fridays. One touch ultra glucometer o Ozempic/Tresiba PAP submitted to [facility] [facility]--4 months supply arrived today 01/15/2019; refill request to be sent in April 2021 (medication will ship to office) . Denies hypoglycemic symptoms; denies hyperglycemic symptoms . Current exercise: walking as able . Current blood glucose readings: patient states FBG<120 in the AM (none <70) . Cardiovascular risk reduction: o Current hypertensive regimen: hydralazine TID o Current hyperlipidemia regimen: pravastatin 40mg  daily (last filled #90 in 01/2019); LDL increased to 102 on 02/26/19 o Watch Scr and insulin/need for medication adjustments.  Scr last 3.02 on 02/26/19 (decreased from 3.17 On 08/29/18)  Pharmacist Clinical Goal(s):  [name] Over the next 90 days, patient with work with PharmD and primary care provider to address optimization of medication management of chronic conditions.  Interventions: Call completed on 02/26/19 . Comprehensive medication review performed, medication list updated in electronic medical record.   . Reviewed & discussed the following diabetes-related information with patient: o Follow ADA recommended "diabetes-friendly" diet  (reviewed healthy snack/food options) o Discussed insulin/GLP-1 injection technique o Reviewed medication purpose/side effects-->patient denies adverse events  Patient Self Care Activities:  . Patient will check blood glucose daily , document, and provide at future appointments . Patient will focus on medication adherence by taking insulin and GLP-1 as prescribed. . Patient will take medications as prescribed . Patient will contact provider with any episodes of hypoglycemia .  Patient will report any questions or concerns to provider   Please see past updates related to this  goal by clicking on the "Past Updates" button in the selected goal          The patient verbalized understanding of instructions provided today and declined a print copy of patient instruction materials.   The care management team will reach out to the patient again over the next 90 days.   SIGNATURE [name], PharmD, BCPS Clinical Pharmacist, [facility] [place]: [phone]

## 2019-03-03 NOTE — Progress Notes (Signed)
Chronic Care Management    Visit Note  02/26/2019 Name: Justin Holloway MRN: [id number] DOB: [birth year]  Referred by: [name], MD Reason for referral : Chronic Care Management   Justin Holloway is a 86 y.o. year old male who is a primary care patient of [name], MD. The CCM team was consulted for assistance with chronic disease management and care coordination needs related to HLD and DMII  Review of patient status, including review of consultants reports, relevant laboratory and other test results, and collaboration with appropriate care team members and the patient's provider was performed as part of comprehensive patient evaluation and provision of chronic care management services.    SDOH (Social Determinants of Health) assessments performed: Yes    Medications: Outpatient Encounter Medications as of 02/26/2019  Medication Sig Note  . Ascorbic Acid (VITAMIN C) 1000 MG tablet Take 1,000 mg by mouth daily.   [name] aspirin EC 81 MG tablet Take 81 mg by mouth every evening.   . Blood Glucose Monitoring Suppl (ONE TOUCH ULTRA 2) w/Device KIT Use as directed to check blood sugars 2 times per day dx: e11.22   . cholecalciferol (VITAMIN D) 1000 units tablet Take 2,000 Units by mouth 2 (two) times daily.    . Easy Comfort Lancets MISC USE LANCETS TO TEST BLOOD SUGAR 3 TIME(S) DAILY , ONE LANCET PER TEST   . furosemide (LASIX) 80 MG tablet Take by mouth. Take 58m in the AM and 437min the early evening.   . hydrALAZINE (APRESOLINE) 25 MG tablet TAKE 1 TABLET BY MOUTH THREE TIMES DAILY   . Insulin Degludec (TRESIBA FLEXTOUCH) 200 UNIT/ML SOPN Inject 25 Units into the skin at bedtime.   . Marland Kitchenactulose (CEPHULAC) 10 g packet Take 1 packet (10 g total) by mouth 3 (three) times daily.   . Marland KitchenUMIGAN 0.01 % SOLN    . meclizine (ANTIVERT) 12.5 MG tablet TAKE 1 TABLET BY MOUTH THREE TIMES DAILY AS NEEDED FOR DIZZINESS   . Multiple Vitamin (MULTIVITAMIN WITH MINERALS) TABS tablet Take 1  tablet by mouth daily.   . Glory RosebushLTRA test strip USE STRIPS TO TEST BLOOD SUGAR 3 TIME(S) DAILY, ONE STRIP PER TEST   . pravastatin (PRAVACHOL) 40 MG tablet Take 1 tablet by mouth daily   . pregabalin (LYRICA) 25 MG capsule Take 25 mg by mouth daily. 12/04/2018: Justin Maywoodatient assistance  . Semaglutide (OZEMPIC) 0.25 or 0.5 MG/DOSE SOPN Inject 0.5 mg into the skin once a week. 05/16/2018: On Fridays    No facility-administered encounter medications on file as of 02/26/2019.     Objective:   Goals Addressed            This Visit's Progress     Patient Stated   . "I can't afford my diabetic medicines" (pt-stated)       Current Barriers:  . Marland Kitchennowledge Deficits related to resources to assist with cost of medications to help manage chronic conditions such as diabetes and neuropathy  CCM PharmD Clinical Goal(s):  . Marland Kitchenver the next 30 days, patient will work with CCM team to address needs related to cost of Ozempic and [place] Interventions:   Completed CCM PharmD telephone outreach with patient on 02/12/19  Evaluation of current treatment plan related to pharmacological treatment for Diabetes and patient's adherence to plan as established by provider . Reviewed medications with patient-->updated medication list per patient report & dispense history . Discussed plans with patient for ongoing care management follow up and provided  patient with direct contact information for care management team . Collaboration with [facility], Etter Sjogren for patient assistance programs for [employer] through [employer] (provides free pen needles as well).  Patient qualifies based on initial screening.  Medications will be delivered to PCP office once approved & shipped. . 02/26/19--patient approved for [employer] (ozempic and tresiba) patient picked up 1-monthsupply.  Refill request will need to be sent in April 2021   Patient Self Care Activities:   VRosezena Sensorunderstanding of the  education/information provided today . Self administers medications as prescribed . Attends all scheduled provider appointments . Calls pharmacy for medication refills . Performs ADL's independently . Performs IADL's independently . Calls provider office for new concerns or questions  Please see past updates related to this goal by clicking on the "Past Updates" button in the selected goal       . I would like to continue managing my diabetes and get my A1c <7 (pt-stated)       Current Barriers:  . Diabetes: T2DM; most recent A1c 7.0% on 02/26/19 (was 7.9% on 8/20, 9.2% on 06/10/2018)  . Current antihyperglycemic regimen: Tresiba 25 units qHS, Ozempic once weekly sq on Fridays. One touch ultra glucometer o Ozempic/Tresiba PAP submitted to [facility]--4 months supply arrived today 01/15/2019; refill request to be sent in April 2021 (medication will ship to office) . Denies hypoglycemic symptoms; denies hyperglycemic symptoms . Current exercise: walking as able . Current blood glucose readings: patient states FBG<120 in the AM (none <70) . Cardiovascular risk reduction: o Current hypertensive regimen: hydralazine TID o Current hyperlipidemia regimen: pravastatin 412mdaily (last filled #90 in 01/2019); LDL increased to 102 on 02/26/19 o Watch Scr and insulin/need for medication adjustments.  Scr last 3.02 on 02/26/19 (decreased from 3.17 On 08/29/18)  Pharmacist Clinical Goal(s):  . Marland Kitchenver the next 90 days, patient with work with PharmD and primary care provider to address optimization of medication management of chronic conditions.  Interventions: Call completed on 02/26/19 . Comprehensive medication review performed, medication list updated in electronic medical record.   . Reviewed & discussed the following diabetes-related information with patient: o Follow ADA recommended "diabetes-friendly" diet  (reviewed healthy snack/food options) o Discussed insulin/GLP-1 injection  technique o Reviewed medication purpose/side effects-->patient denies adverse events  Patient Self Care Activities:  . Patient will check blood glucose daily , document, and provide at future appointments . Patient will focus on medication adherence by taking insulin and GLP-1 as prescribed. . Patient will take medications as prescribed . Patient will contact provider with any episodes of hypoglycemia . Patient will report any questions or concerns to provider   Please see past updates related to this goal by clicking on the "Past Updates" button in the selected goal             Plan:   The care management team will reach out to the patient again over the next 90 days.   Provider Signature JuRegina EckPharmD, BCPS Clinical Pharmacist, [facility] [phone]

## 2019-03-05 ENCOUNTER — Other Ambulatory Visit: Payer: Self-pay | Admitting: Internal Medicine

## 2019-03-06 ENCOUNTER — Ambulatory Visit: Payer: Medicare Other | Attending: Internal Medicine

## 2019-03-06 DIAGNOSIS — Z23 Encounter for immunization: Secondary | ICD-10-CM | POA: Insufficient documentation

## 2019-03-06 NOTE — Progress Notes (Signed)
   Covid-19 Vaccination Clinic  Name:  Justin Holloway    MRN: [id number] DOB: 12/26/1929  03/06/2019  [name] was observed post Covid-19 immunization for 15 minutes without incidence. He was provided with Vaccine Information Sheet and instruction to access the V-Safe system.   [name] was instructed to call 911 with any severe reactions post vaccine: [name] Difficulty breathing  . Swelling of your face and throat  . A fast heartbeat  . A bad rash all over your body  . Dizziness and weakness    Immunizations Administered    Name Date Dose VIS Date Route   Pfizer COVID-19 Vaccine 03/06/2019  9:11 AM 0.3 mL 12/20/2018 Intramuscular   Manufacturer: [place]   Lot: [id number]   [place]: [address]-2

## 2019-03-11 ENCOUNTER — Other Ambulatory Visit: Payer: Self-pay

## 2019-03-11 MED ORDER — NITROFURANTOIN MONOHYD MACRO 100 MG PO CAPS: 100.0000 mg | ORAL_CAPSULE | Freq: Two times a day (BID) | ORAL | 0 refills | Status: DC

## 2019-03-11 NOTE — Telephone Encounter (Signed)
The pt called and said that his medication for his UTI isn't at his pharmacy.  The patient was notified that I was faxing the medication to his pharmacy and to check with his pharmacy in an hour to make sure that his prescription is ready for pickup.

## 2019-03-25 ENCOUNTER — Telehealth: Payer: Self-pay

## 2019-03-25 DIAGNOSIS — Z79899 Other long term (current) drug therapy: Secondary | ICD-10-CM | POA: Diagnosis not present

## 2019-03-25 DIAGNOSIS — G629 Polyneuropathy, unspecified: Secondary | ICD-10-CM | POA: Diagnosis not present

## 2019-03-25 DIAGNOSIS — G894 Chronic pain syndrome: Secondary | ICD-10-CM | POA: Diagnosis not present

## 2019-03-25 DIAGNOSIS — Z79891 Long term (current) use of opiate analgesic: Secondary | ICD-10-CM | POA: Diagnosis not present

## 2019-03-25 NOTE — Telephone Encounter (Signed)
LVM w/pt wife pt medication Ozempic is ready for pickup

## 2019-04-10 ENCOUNTER — Telehealth: Payer: Self-pay

## 2019-04-18 DIAGNOSIS — N184 Chronic kidney disease, stage 4 (severe): Secondary | ICD-10-CM | POA: Diagnosis not present

## 2019-04-23 DIAGNOSIS — H401133 Primary open-angle glaucoma, bilateral, severe stage: Secondary | ICD-10-CM | POA: Diagnosis not present

## 2019-04-24 ENCOUNTER — Other Ambulatory Visit: Payer: Self-pay

## 2019-04-24 MED ORDER — ONETOUCH ULTRA VI STRP: ORAL_STRIP | 2 refills | Status: DC

## 2019-04-24 MED ORDER — ONETOUCH ULTRA 2 W/DEVICE KIT: PACK | 0 refills | Status: DC

## 2019-04-24 MED ORDER — ONETOUCH DELICA PLUS LANCET33G MISC: 100.0000 | Freq: Three times a day (TID) | 3 refills | Status: DC

## 2019-04-28 ENCOUNTER — Other Ambulatory Visit: Payer: Self-pay

## 2019-04-28 ENCOUNTER — Ambulatory Visit (INDEPENDENT_AMBULATORY_CARE_PROVIDER_SITE_OTHER): Payer: Medicare Other | Admitting: Nurse Practitioner

## 2019-04-28 ENCOUNTER — Encounter: Payer: Self-pay | Admitting: Nurse Practitioner

## 2019-04-28 VITALS — BP 128/82 | HR 86 | Temp 98.3°F | Ht 65.0 in | Wt 227.0 lb

## 2019-04-28 DIAGNOSIS — N390 Urinary tract infection, site not specified: Secondary | ICD-10-CM | POA: Diagnosis not present

## 2019-04-28 DIAGNOSIS — R350 Frequency of micturition: Secondary | ICD-10-CM

## 2019-04-28 LAB — POCT URINALYSIS DIPSTICK
Bilirubin, UA: NEGATIVE
Blood, UA: NEGATIVE
Glucose, UA: NEGATIVE
Ketones, UA: NEGATIVE
Nitrite, UA: POSITIVE
Protein, UA: POSITIVE — AB
Spec Grav, UA: 1.025 (ref 1.010–1.025)
Urobilinogen, UA: 0.2 E.U./dL
pH, UA: 5.5 (ref 5.0–8.0)

## 2019-04-28 MED ORDER — ONETOUCH DELICA PLUS LANCET33G MISC: 100.0000 | Freq: Three times a day (TID) | 3 refills | Status: DC

## 2019-04-28 MED ORDER — HYDRALAZINE HCL 25 MG PO TABS: 25.0000 mg | ORAL_TABLET | Freq: Three times a day (TID) | ORAL | 0 refills | Status: DC

## 2019-04-28 MED ORDER — OZEMPIC (0.25 OR 0.5 MG/DOSE) 2 MG/1.5ML ~~LOC~~ SOPN: 0.5000 mg | PEN_INJECTOR | SUBCUTANEOUS | 1 refills | Status: DC

## 2019-04-28 MED ORDER — FUROSEMIDE 80 MG PO TABS: ORAL_TABLET | ORAL | 0 refills | Status: DC

## 2019-04-28 MED ORDER — CEFTRIAXONE SODIUM 1 G IJ SOLR
1.0000 g | Freq: Once | INTRAMUSCULAR | Status: AC
  Administered 2019-04-28: 13:00:00 1 g via INTRAMUSCULAR

## 2019-04-28 MED ORDER — ONETOUCH ULTRA VI STRP: ORAL_STRIP | 2 refills | Status: DC

## 2019-04-28 MED ORDER — PRAVASTATIN SODIUM 40 MG PO TABS: ORAL_TABLET | ORAL | 1 refills | Status: DC

## 2019-04-28 MED ORDER — ONETOUCH ULTRA 2 W/DEVICE KIT: PACK | 0 refills | Status: DC

## 2019-04-28 NOTE — Progress Notes (Signed)
This visit occurred during the SARS-CoV-2 public health emergency.  Safety protocols were in place, including screening questions prior to the visit, additional usage of staff PPE, and extensive cleaning of exam room while observing appropriate contact time as indicated for disinfecting solutions.  Subjective:     Patient ID: Justin Holloway , male    DOB: [birth year] , 86 y.o.   MRN: [id number]   Chief Complaint  Patient presents with  . Dysuria    patient has been urinating more frequently     HPI  He is taking furosemide 80 mg in am and 40 mg at lunch time - this was advised by Nephrology (due to see him on Wednesday of this week)  He reports he has seen a urologist 2 years ago.    Dysuria  This is a recurrent problem. The current episode started 1 to 4 weeks ago. The problem occurs every urination. There has been no fever. He is not sexually active. There is no history of pyelonephritis. Associated symptoms include frequency. Pertinent negatives include no chills, hesitancy or urgency. He has tried antibiotics (2 months ago had UTI) for the symptoms. His past medical history is significant for recurrent UTIs.     Past Medical History:  Diagnosis Date  . Arthritis   . CKD (chronic kidney disease), stage III   . Diabetes mellitus without complication ([place])   . Glaucoma    "blurred vision", see Dr Gershon Crane yearly  . Hyperlipidemia   . Hypertension   . TIA (transient ischemic attack)      Family History  Problem Relation Age of Onset  . Cancer Mother 56       breast  . COPD Father 57       Congestive heart failure     Current Outpatient Medications:  .  Ascorbic Acid (VITAMIN C) 1000 MG tablet, Take 1,000 mg by mouth daily., Disp: , Rfl:  .  aspirin EC 81 MG tablet, Take 81 mg by mouth every evening., Disp: , Rfl:  .  Blood Glucose Monitoring Suppl (ONE TOUCH ULTRA 2) w/Device KIT, Use as directed to check blood sugars 2 times per day dx: e11.22, Disp: 1 kit, Rfl: 0 .   cholecalciferol (VITAMIN D) 1000 units tablet, Take 2,000 Units by mouth 2 (two) times daily. , Disp: , Rfl:  .  furosemide (LASIX) 80 MG tablet, Take by mouth. Take 32m in the AM and 475min the early evening., Disp: , Rfl:  .  glucose blood (ONETOUCH ULTRA) test strip, USE STRIPS TO TEST BLOOD SUGAR 3 TIME(S) DAILY, ONE STRIP PER TEST, Disp: 100 each, Rfl: 2 .  hydrALAZINE (APRESOLINE) 25 MG tablet, TAKE 1 TABLET BY MOUTH THREE TIMES DAILY, Disp: 270 tablet, Rfl: 0 .  Insulin Degludec (TRESIBA FLEXTOUCH) 200 UNIT/ML SOPN, Inject 25 Units into the skin at bedtime., Disp: , Rfl:  .  lactulose (CEPHULAC) 10 g packet, Take 1 packet (10 g total) by mouth 3 (three) times daily., Disp: 30 each, Rfl: 0 .  Lancets (ONETOUCH DELICA PLUS [id number], 100 each by Does not apply route in the morning, at noon, and at bedtime., Disp: 300 each, Rfl: 3 .  LUMIGAN 0.01 % SOLN, , Disp: , Rfl:  .  meclizine (ANTIVERT) 12.5 MG tablet, TAKE 1 TABLET BY MOUTH THREE TIMES DAILY AS NEEDED FOR DIZZINESS, Disp: 20 tablet, Rfl: 0 .  Multiple Vitamin (MULTIVITAMIN WITH MINERALS) TABS tablet, Take 1 tablet by mouth daily., Disp: , Rfl:  .  nitrofurantoin, macrocrystal-monohydrate, (MACROBID) 100 MG capsule, Take 1 capsule (100 mg total) by mouth 2 (two) times daily., Disp: 10 capsule, Rfl: 0 .  pravastatin (PRAVACHOL) 40 MG tablet, Take 1 tablet by mouth daily, Disp: 90 tablet, Rfl: 1 .  pregabalin (LYRICA) 25 MG capsule, Take 25 mg by mouth daily., Disp: , Rfl:  .  Semaglutide (OZEMPIC) 0.25 or 0.5 MG/DOSE SOPN, Inject 0.5 mg into the skin once a week., Disp: , Rfl:    Allergies  Allergen Reactions  . Gabapentin Itching     Review of Systems  Constitutional: Negative for chills.  Genitourinary: Positive for dysuria and frequency. Negative for hesitancy and urgency.  Neurological: Negative for dizziness and headaches.  Psychiatric/Behavioral: Negative.     Today's Vitals   04/28/19 1212  BP: 128/82  Pulse: 86   Temp: 98.3 F (36.8 C)  TempSrc: Oral  Weight: 227 lb (103 kg)  Height: 5' 5"  (1.651 m)  PainSc: 0-No pain   Body mass index is 37.77 kg/m.   Objective:  Physical Exam Constitutional:      Appearance: Normal appearance.  Cardiovascular:     Rate and Rhythm: Normal rate and regular rhythm.     Pulses: Normal pulses.     Heart sounds: Normal heart sounds. No murmur.  Pulmonary:     Effort: Pulmonary effort is normal. No respiratory distress.  Neurological:     General: No focal deficit present.     Mental Status: He is alert and oriented to person, place, and time.     Cranial Nerves: No cranial nerve deficit.  Psychiatric:        Mood and Affect: Mood normal.        Behavior: Behavior normal.        Thought Content: Thought content normal.        Judgment: Judgment normal.         Assessment And Plan:     1. Urinary tract infection without hematuria, site unspecified  Positive nitrates in his urine will treat with 1 gram of rocephin   Will send for a culture this is his second urinary tract infection in 2 months  He did not stop for the PSA - Culture, Urine - PSA - cefTRIAXone (ROCEPHIN) injection 1 g       Minette Brine, FNP    THE PATIENT IS ENCOURAGED TO PRACTICE SOCIAL DISTANCING DUE TO THE COVID-19 PANDEMIC.

## 2019-04-30 DIAGNOSIS — N184 Chronic kidney disease, stage 4 (severe): Secondary | ICD-10-CM | POA: Diagnosis not present

## 2019-04-30 DIAGNOSIS — N2581 Secondary hyperparathyroidism of renal origin: Secondary | ICD-10-CM | POA: Diagnosis not present

## 2019-04-30 DIAGNOSIS — R609 Edema, unspecified: Secondary | ICD-10-CM | POA: Diagnosis not present

## 2019-04-30 DIAGNOSIS — I129 Hypertensive chronic kidney disease with stage 1 through stage 4 chronic kidney disease, or unspecified chronic kidney disease: Secondary | ICD-10-CM | POA: Diagnosis not present

## 2019-04-30 DIAGNOSIS — D631 Anemia in chronic kidney disease: Secondary | ICD-10-CM | POA: Diagnosis not present

## 2019-05-01 ENCOUNTER — Telehealth: Payer: Self-pay

## 2019-05-01 LAB — URINE CULTURE

## 2019-05-04 ENCOUNTER — Other Ambulatory Visit: Payer: Self-pay | Admitting: Internal Medicine

## 2019-05-06 ENCOUNTER — Telehealth: Payer: Self-pay

## 2019-05-06 NOTE — Telephone Encounter (Signed)
Okay thank you

## 2019-05-06 NOTE — Telephone Encounter (Signed)
Pt wide stated you were going to refer him to a urologist but he already has one so they will call to make him an appt

## 2019-05-09 ENCOUNTER — Telehealth: Payer: Self-pay

## 2019-05-12 DIAGNOSIS — N3000 Acute cystitis without hematuria: Secondary | ICD-10-CM | POA: Diagnosis not present

## 2019-05-14 ENCOUNTER — Telehealth: Payer: Self-pay | Admitting: Internal Medicine

## 2019-05-14 NOTE — Chronic Care Management (AMB) (Signed)
  Care Management   Note  05/14/2019 Name: Justin Holloway MRN: [id number] DOB: 1929/12/02  Justin Holloway is a 86 y.o. year old male who is a primary care patient of [name], MD and is actively engaged with the care management team. I reached out to Justin Holloway by phone today to assist with scheduling an initial visit with the Pharmacist.  Follow up plan: Telephone appointment with care management team member scheduled for: 06/04/2019  [place]  [place], [place] [address] Direct Dial: [email or web address] Website: [email or web address]

## 2019-05-20 ENCOUNTER — Ambulatory Visit: Payer: Medicare Other | Admitting: Podiatry

## 2019-05-20 ENCOUNTER — Telehealth: Payer: Self-pay

## 2019-05-20 NOTE — Telephone Encounter (Signed)
Patient called back in wanting to see Dr Baird Cancer today for his chest pain and left side pain, patient stated the chest pain comes and goes but the left side pain is staying, he states the chest pain has returned since he spoke with Trilby Drummer.

## 2019-05-20 NOTE — Telephone Encounter (Signed)
I returned the pt's call about him having chest and back pain.  The pt said that his chest pain comes and goes and that he is having side pain.  I offered the pt an appointment with Laurance Flatten, NP for today and he said that he would rather see Dr. Baird Cancer.  The pt said that he will see Dr. Baird Cancer at his appt scheduled for 05/19.  The pt was told to go to the ER if his chest pain comes back.

## 2019-05-21 ENCOUNTER — Emergency Department (HOSPITAL_COMMUNITY)
Admission: EM | Admit: 2019-05-21 | Discharge: 2019-05-21 | Disposition: A | Discharge status: Home / Self Care | Payer: Medicare Other | Attending: Emergency Medicine | Admitting: Emergency Medicine

## 2019-05-21 ENCOUNTER — Emergency Department (HOSPITAL_COMMUNITY): Payer: Medicare Other

## 2019-05-21 ENCOUNTER — Encounter (HOSPITAL_COMMUNITY): Payer: Self-pay | Admitting: *Deleted

## 2019-05-21 ENCOUNTER — Other Ambulatory Visit: Payer: Self-pay

## 2019-05-21 DIAGNOSIS — Z7982 Long term (current) use of aspirin: Secondary | ICD-10-CM | POA: Diagnosis not present

## 2019-05-21 DIAGNOSIS — M545 Low back pain: Secondary | ICD-10-CM | POA: Diagnosis not present

## 2019-05-21 DIAGNOSIS — N3000 Acute cystitis without hematuria: Secondary | ICD-10-CM

## 2019-05-21 DIAGNOSIS — I129 Hypertensive chronic kidney disease with stage 1 through stage 4 chronic kidney disease, or unspecified chronic kidney disease: Secondary | ICD-10-CM | POA: Diagnosis not present

## 2019-05-21 DIAGNOSIS — Z87891 Personal history of nicotine dependence: Secondary | ICD-10-CM | POA: Insufficient documentation

## 2019-05-21 DIAGNOSIS — E1122 Type 2 diabetes mellitus with diabetic chronic kidney disease: Secondary | ICD-10-CM | POA: Diagnosis not present

## 2019-05-21 DIAGNOSIS — Z79899 Other long term (current) drug therapy: Secondary | ICD-10-CM | POA: Insufficient documentation

## 2019-05-21 DIAGNOSIS — K567 Ileus, unspecified: Secondary | ICD-10-CM | POA: Diagnosis not present

## 2019-05-21 DIAGNOSIS — N184 Chronic kidney disease, stage 4 (severe): Secondary | ICD-10-CM | POA: Insufficient documentation

## 2019-05-21 DIAGNOSIS — Z794 Long term (current) use of insulin: Secondary | ICD-10-CM | POA: Insufficient documentation

## 2019-05-21 DIAGNOSIS — R109 Unspecified abdominal pain: Secondary | ICD-10-CM | POA: Diagnosis not present

## 2019-05-21 DIAGNOSIS — R198 Other specified symptoms and signs involving the digestive system and abdomen: Secondary | ICD-10-CM | POA: Diagnosis not present

## 2019-05-21 DIAGNOSIS — R1084 Generalized abdominal pain: Secondary | ICD-10-CM | POA: Diagnosis not present

## 2019-05-21 LAB — COMPREHENSIVE METABOLIC PANEL
ALT: 20 U/L (ref 0–44)
AST: 29 U/L (ref 15–41)
Albumin: 4.1 g/dL (ref 3.5–5.0)
Alkaline Phosphatase: 57 U/L (ref 38–126)
Anion gap: 10 (ref 5–15)
BUN: 55 mg/dL — ABNORMAL HIGH (ref 8–23)
CO2: 27 mmol/L (ref 22–32)
Calcium: 9.4 mg/dL (ref 8.9–10.3)
Chloride: 104 mmol/L (ref 98–111)
Creatinine, Ser: 3.16 mg/dL — ABNORMAL HIGH (ref 0.61–1.24)
GFR calc Af Amer: 19 mL/min — ABNORMAL LOW (ref >60)
GFR calc non Af Amer: 17 mL/min — ABNORMAL LOW (ref >60)
Glucose, Bld: 119 mg/dL — ABNORMAL HIGH (ref 70–99)
Potassium: 4.5 mmol/L (ref 3.5–5.1)
Sodium: 141 mmol/L (ref 135–145)
Total Bilirubin: 0.7 mg/dL (ref 0.3–1.2)
Total Protein: 7.6 g/dL (ref 6.5–8.1)

## 2019-05-21 LAB — CBC WITH DIFFERENTIAL/PLATELET
Abs Immature Granulocytes: 0.02 10*3/uL (ref 0.00–0.07)
Basophils Absolute: 0 10*3/uL (ref 0.0–0.1)
Basophils Relative: 0 %
Eosinophils Absolute: 0.1 10*3/uL (ref 0.0–0.5)
Eosinophils Relative: 2 %
HCT: 37.2 % — ABNORMAL LOW (ref 39.0–52.0)
Hemoglobin: 11.7 g/dL — ABNORMAL LOW (ref 13.0–17.0)
Immature Granulocytes: 0 %
Lymphocytes Relative: 34 %
Lymphs Abs: 2.3 10*3/uL (ref 0.7–4.0)
MCH: 29.4 pg (ref 26.0–34.0)
MCHC: 31.5 g/dL (ref 30.0–36.0)
MCV: 93.5 fL (ref 80.0–100.0)
Monocytes Absolute: 0.6 10*3/uL (ref 0.1–1.0)
Monocytes Relative: 9 %
Neutro Abs: 3.7 10*3/uL (ref 1.7–7.7)
Neutrophils Relative %: 55 %
Platelets: 210 10*3/uL (ref 150–400)
RBC: 3.98 MIL/uL — ABNORMAL LOW (ref 4.22–5.81)
RDW: 12.8 % (ref 11.5–15.5)
WBC: 6.9 10*3/uL (ref 4.0–10.5)
nRBC: 0 % (ref 0.0–0.2)

## 2019-05-21 LAB — URINALYSIS, ROUTINE W REFLEX MICROSCOPIC
Bilirubin Urine: NEGATIVE
Glucose, UA: NEGATIVE mg/dL
Hgb urine dipstick: NEGATIVE
Ketones, ur: NEGATIVE mg/dL
Nitrite: NEGATIVE
Protein, ur: NEGATIVE mg/dL
Specific Gravity, Urine: 1.013 (ref 1.005–1.030)
WBC, UA: >50 WBC/hpf — ABNORMAL HIGH (ref 0–5)
pH: 6 (ref 5.0–8.0)

## 2019-05-21 LAB — LIPASE, BLOOD: Lipase: 48 U/L (ref 11–51)

## 2019-05-21 MED ORDER — CEPHALEXIN 500 MG PO CAPS: 500.0000 mg | ORAL_CAPSULE | Freq: Four times a day (QID) | ORAL | 0 refills | Status: AC

## 2019-05-21 NOTE — ED Notes (Signed)
Pt given urinal and aware of urine specimen need.

## 2019-05-21 NOTE — ED Triage Notes (Signed)
Pt complains of abdominal cramping for the past couple weeks. He went to urgent care, had abdominal x-ray showing "adynamic small bowel ileus likely related to constipation". He last had BM yesterday morning and felt like he had to go again last night but couldn't, states "it was just noise and air".

## 2019-05-21 NOTE — ED Provider Notes (Signed)
Medical screening examination/treatment/procedure(s) were conducted as a shared visit with non-physician practitioner(s) and myself.  I personally evaluated the patient during the encounter.    86 year old presents with abdominal noises.  Denies any abdominal pain, fever, vomiting.  Size doctor had acute abdominal series which showed a ileus.  Patient has no symptoms here.  Will repeat films and check labs and order CT scan of concern for obstruction   Lacretia Leigh, MD 05/21/19 1415

## 2019-05-21 NOTE — Discharge Instructions (Addendum)
Your urinalysis did show that you have a urinary tract infection today.  You were given a prescription for antibiotics to take to help with the infection.  A culture was sent of your urine today to determine if there is any bacterial growth. If the results of the culture are positive and you require an antibiotic or a change of your prescribed antibiotic you will be contacted by the hospital. If the results are negative you will not be contacted.   Please follow up with your primary doctor within the next 5-7 days.  If you do not have a primary care provider, information for a healthcare clinic has been provided for you to make arrangements for follow up care. Please return to the ER sooner if you have any new or worsening symptoms, or if you have any of the following symptoms:  Abdominal pain that does not go away.  You have a fever.  You keep throwing up (vomiting).  The pain is felt only in portions of the abdomen. Pain in the right side could possibly be appendicitis. In an adult, pain in the left lower portion of the abdomen could be colitis or diverticulitis.  You pass bloody or black tarry stools.  There is bright red blood in the stool.  The constipation stays for more than 4 days.  There is belly (abdominal) or rectal pain.  You do not seem to be getting better.  You have any questions or concerns.

## 2019-05-21 NOTE — ED Provider Notes (Signed)
[facility] DEPT Provider Note   CSN: [id number] Arrival date & time: 05/21/19  1315     History Chief Complaint  Patient presents with  . Abdominal Pain    Justin Holloway is a 86 y.o. male.  HPI   Pt is an 86 y/o male with a h/o CKD, arthritis, glaucoma, HLD, HTN, and TIA, who presents to the ED today for evaluation of abdominal symptoms. He states that his pain concern is that his abdomen has been making some abnormal sounds lately. He is not sure if the sounds are associated with being hungry or having gas. He has also had intermittent abd cramping that has been present for the last few weeks. He does not have any pain at this time.  Denies any nausea, vomiting, diarrhea. Denies constipation, bloody stools, dysuria, fevers.  Last BM was yesterday AM. He states he tried to have a BM at night but only passed air.   He was seen at urgent care PTA and had an xray which showed an ileus.   Past Medical History:  Diagnosis Date  . Arthritis   . CKD (chronic kidney disease), stage III   . Diabetes mellitus without complication ([place])   . Glaucoma    "blurred vision", see Dr Gershon Crane yearly  . Hyperlipidemia   . Hypertension   . TIA (transient ischemic attack)     Patient Active Problem List   Diagnosis Date Noted  . Diabetic neuropathy ([place]) 02/18/2019  . Left upper quadrant pain 09/04/2018  . Acute midline low back pain without sciatica 09/04/2018  . CKD (chronic kidney disease) stage 4, GFR 15-29 ml/min ([facility]) 11/21/2017  . Hypertensive nephropathy 11/21/2017  . Right foot pain 11/21/2017  . Vertigo 09/09/2017  . BPPV (benign paroxysmal positional vertigo), right 09/06/2017  . Sensorineural hearing loss (SNHL), bilateral 09/06/2017  . Ataxia 02/06/2017  . UTI (lower urinary tract infection) 08/07/2015  . Dizziness 08/07/2015  . Acute renal failure superimposed on stage 3 chronic kidney disease ([place]) 08/07/2015  . Hyperkalemia 08/07/2015    . Left inguinal hernia 05/15/2013  . Diabetes mellitus without complication ([place]) 63/78/5885  . HLD (hyperlipidemia) 03/08/2006  . HYPERTENSION, BENIGN SYSTEMIC 03/08/2006  . 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 PROTEINURIA 03/08/2006    Past Surgical History:  Procedure Laterality Date  . INGUINAL HERNIA REPAIR Left 06/09/2013   Procedure: OPEN REPAIR LEFT INGUINAL HERNIA;  Surgeon: [name], MD;  Location: [facility];  Service: General;  Laterality: Left;  . INSERTION OF MESH Left 06/09/2013   Procedure: INSERTION OF MESH;  Surgeon: [name], MD;  Location: [facility];  Service: General;  Laterality: Left;       Family History  Problem Relation Age of Onset  . Cancer Mother 7       breast  . COPD Father 51       Congestive heart failure    Social History   Tobacco Use  . Smoking status: Former Smoker    Types: Cigars  . Smokeless tobacco: Never Used  Substance Use Topics  . Alcohol use: Never  . Drug use: No    Home Medications Prior to Admission medications   Medication Sig Start Date End Date Taking? Authorizing Provider  Ascorbic Acid (VITAMIN C) 1000 MG tablet Take 1,000 mg by mouth daily.    [provider]  aspirin EC 81 MG tablet Take 81 mg by mouth every evening.    [provider]  Blood Glucose Monitoring  Suppl (ONE TOUCH ULTRA 2) w/Device KIT Use as directed to check blood sugars 2 times per day dx: e11.22 04/28/19   Minette Brine, FNP  cephALEXin (KEFLEX) 500 MG capsule Take 1 capsule (500 mg total) by mouth 4 (four) times daily for 7 days. 05/21/19 05/28/19  Ayonna Speranza S, PA-C  cholecalciferol (VITAMIN D) 1000 units tablet Take 2,000 Units by mouth 2 (two) times daily.     [provider]  furosemide (LASIX) 80 MG tablet Take 26m in the AM and 467min the early evening. 04/28/19   MoMinette BrineFNP  glucose blood (ONETOUCH ULTRA) test strip USE STRIPS TO TEST BLOOD SUGAR 3 TIME(S) DAILY, ONE STRIP PER TEST 04/28/19   MoMinette BrineFNP  hydrALAZINE (APRESOLINE) 25 MG tablet Take 1 tablet (25 mg total) by mouth 3 (three) times daily. 04/28/19   MoMinette BrineFNP  Insulin Degludec (TRESIBA FLEXTOUCH) 200 UNIT/ML SOPN Inject 25 Units into the skin at bedtime.    [provider]  lactulose (CEPHULAC) 10 g packet Take 1 packet (10 g total) by mouth 3 (three) times daily. 10/21/18   LaRegan LemmingMD  Lancets ([facility] CenterELICA PLUS LASFKCLE75TMISC 100 each by Does not apply route in the morning, at noon, and at bedtime. 04/28/19   MoMinette BrineFNP  LUMIGAN 0.01 % SOLN  01/22/18   [provider]  meclizine (ANTIVERT) 12.5 MG tablet TAKE 1 TABLET BY MOUTH THREE TIMES DAILY AS NEEDED FOR DIZZINESS 02/13/19   SaGlendale ChardMD  Multiple Vitamin (MULTIVITAMIN WITH MINERALS) TABS tablet Take 1 tablet by mouth daily.    [provider]  nitrofurantoin, macrocrystal-monohydrate, (MACROBID) 100 MG capsule Take 1 capsule (100 mg total) by mouth 2 (two) times daily. 03/11/19   SaGlendale ChardMD  pravastatin (PRAVACHOL) 40 MG tablet TAKE 1 TABLET BY MOUTH DAILY 05/05/19   SaGlendale ChardMD  pregabalin (LYRICA) 25 MG capsule Take 25 mg by mouth daily.    [provider]  Semaglutide,0.25 or 0.5MG/DOS, (OZEMPIC, 0.25 OR 0.5 MG/DOSE,) 2 MG/1.5ML SOPN Inject 0.5 mg into the skin once a week. 04/28/19   MoMinette BrineFNP    Allergies    Gabapentin  Review of Systems   Review of Systems  Constitutional: Negative for chills and fever.  HENT: Negative for sore throat.   Eyes: Negative for visual disturbance.  Respiratory: Negative for cough and shortness of breath.   Cardiovascular: Negative for chest pain and palpitations.  Gastrointestinal: Negative for abdominal pain, constipation, diarrhea, nausea and vomiting.       Abd noises  Genitourinary: Negative for dysuria and hematuria.  Musculoskeletal: Negative for back pain.  Skin: Negative for rash.  Neurological: Negative for headaches.  All  other systems reviewed and are negative.   Physical Exam Updated Vital Signs BP 139/70   Pulse 82   Temp 98.2 F (36.8 C) (Oral)   Resp 18   Ht 5' 5"  (1.651 m)   Wt 102.5 kg   SpO2 99%   BMI 37.61 kg/m   Physical Exam Vitals and nursing note reviewed.  Constitutional:      Appearance: He is well-developed.  HENT:     Head: Normocephalic and atraumatic.  Eyes:     Conjunctiva/sclera: Conjunctivae normal.  Cardiovascular:     Rate and Rhythm: Normal rate and regular rhythm.     Heart sounds: Normal heart sounds. No murmur.  Pulmonary:     Effort: Pulmonary effort is normal. No respiratory distress.  Breath sounds: Normal breath sounds. No wheezing, rhonchi or rales.  Abdominal:     General: Bowel sounds are normal.     Palpations: Abdomen is soft.     Tenderness: There is no abdominal tenderness. There is no guarding or rebound.  Musculoskeletal:     Cervical back: Neck supple.  Skin:    General: Skin is warm and dry.  Neurological:     Mental Status: He is alert.     ED Results / Procedures / Treatments   Labs (all labs ordered are listed, but only abnormal results are displayed) Labs Reviewed  CBC WITH DIFFERENTIAL/PLATELET - Abnormal; Notable for the following components:      Result Value   RBC 3.98 (*)    Hemoglobin 11.7 (*)    HCT 37.2 (*)    All other components within normal limits  COMPREHENSIVE METABOLIC PANEL - Abnormal; Notable for the following components:   Glucose, Bld 119 (*)    BUN 55 (*)    Creatinine, Ser 3.16 (*)    GFR calc non Af Amer 17 (*)    GFR calc Af Amer 19 (*)    All other components within normal limits  URINALYSIS, ROUTINE W REFLEX MICROSCOPIC - Abnormal; Notable for the following components:   APPearance HAZY (*)    Leukocytes,Ua LARGE (*)    WBC, UA >50 (*)    Bacteria, UA MANY (*)    All other components within normal limits  URINE CULTURE  LIPASE, BLOOD    EKG None  Radiology DG Abdomen Acute  W/Chest  Result Date: 05/21/2019 CLINICAL DATA:  Abdominal cramping for the past couple weeks, prior radiographs demonstrating ileus due to constipation diabetes mellitus, hypertension, last bowel movement yesterday but had urge to go afterwards with gas EXAM: DG ABDOMEN ACUTE W/ 1V CHEST COMPARISON:  Abdominal radiographs 10/21/2018, chest radiograph 02/07/2016 FINDINGS: Normal heart size, mediastinal contours, and pulmonary vascularity. Atherosclerotic calcification aorta. Chronic eventration LEFT diaphragm with mild LEFT basilar atelectasis. Remaining lungs clear. No pleural effusion or pneumothorax. Nonobstructive bowel gas pattern. Scattered gas and stool throughout proximal half of colon with paucity of rectosigmoid gas. No evidence of obstruction, wall thickening or free air. Degenerative disc disease changes thoracolumbar spine. No urinary tract calcification definitely visualized. IMPRESSION: Mild chronic LEFT basilar atelectasis. Nonobstructive bowel gas pattern. No acute intra-abdominal findings. Electronically Signed   By: Lavonia Dana M.D.   On: 05/21/2019 14:56    Procedures Procedures (including critical care time)  Medications Ordered in ED Medications - No data to display  ED Course  I have reviewed the triage vital signs and the nursing notes.  Pertinent labs & imaging results that were available during my care of the patient were reviewed by me and considered in my medical decision making (see chart for details).    MDM Rules/Calculators/A&P                      86 y/o male presenting for eval of abd noises that have been ongoing intermittently for weeks. He has had intermittent discomfort to the abd but does not have any at this time. He was at [facility] and had an xray which showed an ileus. He was sent here for further eval.  VS are wnl. Abd is completely soft and nontender. There remainder of his exam is wnl.   Reviewed/interpreted labs  CBC is at baseline. No  leukocytosis, stable anemia.  CMP with Cr at 3.16, consistent with baseline.  Otherwise reassuring.  Lipase wnl UA with large leukocytes, 0-5 RBCs, >50 WBCs, and many bacteria consistent with UTI. Urine culture sent.   Xray abd repeated and was personally reviewed/interpreted - Mild chronic LEFT basilar atelectasis. Nonobstructive bowel gas pattern. No acute intra-abdominal findings  Patient was observed in the ED for several hours.  He continued to have no abdominal pain throughout his stay and on reassessment he is in no acute distress.  We discussed the results of work-up and plan for discharge home.  I will give him antibiotics for his UTI.  Advised to follow-up with his PCP.  He states he has an appointment with them coming up soon.  Advised on specific return precautions.  He voiced understanding the plan and reasons to return.  All questions answered.  Patient stable for discharge.   Final Clinical Impression(s) / ED Diagnoses Final diagnoses:  Borborygmi  Acute cystitis without hematuria    Rx / DC Orders ED Discharge Orders         Ordered    cephALEXin (KEFLEX) 500 MG capsule  4 times daily     05/21/19 [address], Ulices Maack S, PA-C 05/21/19 1521    Lacretia Leigh, MD 05/23/19 1109

## 2019-05-23 DIAGNOSIS — N184 Chronic kidney disease, stage 4 (severe): Secondary | ICD-10-CM | POA: Diagnosis not present

## 2019-05-23 LAB — URINE CULTURE

## 2019-05-28 ENCOUNTER — Other Ambulatory Visit: Payer: Self-pay

## 2019-05-28 ENCOUNTER — Ambulatory Visit (INDEPENDENT_AMBULATORY_CARE_PROVIDER_SITE_OTHER): Payer: Medicare Other

## 2019-05-28 ENCOUNTER — Encounter: Payer: Self-pay | Admitting: Internal Medicine

## 2019-05-28 ENCOUNTER — Ambulatory Visit (INDEPENDENT_AMBULATORY_CARE_PROVIDER_SITE_OTHER): Payer: Medicare Other | Admitting: Internal Medicine

## 2019-05-28 VITALS — BP 118/64 | HR 93 | Temp 98.3°F | Ht 65.0 in | Wt 222.2 lb

## 2019-05-28 VITALS — BP 118/64 | HR 93 | Temp 98.3°F | Ht 65.0 in | Wt 222.0 lb

## 2019-05-28 DIAGNOSIS — I739 Peripheral vascular disease, unspecified: Secondary | ICD-10-CM | POA: Diagnosis not present

## 2019-05-28 DIAGNOSIS — Z7982 Long term (current) use of aspirin: Secondary | ICD-10-CM

## 2019-05-28 DIAGNOSIS — N184 Chronic kidney disease, stage 4 (severe): Secondary | ICD-10-CM

## 2019-05-28 DIAGNOSIS — I129 Hypertensive chronic kidney disease with stage 1 through stage 4 chronic kidney disease, or unspecified chronic kidney disease: Secondary | ICD-10-CM

## 2019-05-28 DIAGNOSIS — E119 Type 2 diabetes mellitus without complications: Secondary | ICD-10-CM

## 2019-05-28 DIAGNOSIS — E1122 Type 2 diabetes mellitus with diabetic chronic kidney disease: Secondary | ICD-10-CM

## 2019-05-28 DIAGNOSIS — R079 Chest pain, unspecified: Secondary | ICD-10-CM

## 2019-05-28 DIAGNOSIS — Z794 Long term (current) use of insulin: Secondary | ICD-10-CM

## 2019-05-28 DIAGNOSIS — Z6836 Body mass index (BMI) 36.0-36.9, adult: Secondary | ICD-10-CM

## 2019-05-28 DIAGNOSIS — Z0000 Encounter for general adult medical examination without abnormal findings: Secondary | ICD-10-CM | POA: Diagnosis not present

## 2019-05-28 LAB — POCT UA - MICROALBUMIN
Creatinine, POC: 200 mg/dL
Microalbumin Ur, POC: 80 mg/L

## 2019-05-28 NOTE — Patient Instructions (Signed)
Justin Holloway , Thank you for taking time to come for your Medicare Wellness Visit. I appreciate your ongoing commitment to your health goals. Please review the following plan we discussed and let me know if I can assist you in the future.   Screening recommendations/referrals: Colonoscopy: not required Recommended yearly ophthalmology/optometry visit for glaucoma screening and checkup Recommended yearly dental visit for hygiene and checkup  Vaccinations: Influenza vaccine: 08/2018 Pneumococcal vaccine: 10/2011 Tdap vaccine: 11/2012 Shingles vaccine: discussed    Advanced directives: Advance directive discussed with you today. Even though you declined this today please call our office should you change your mind and we can give you the proper paperwork for you to fill out.  Conditions/risks identified: obesity  Next appointment: 09/10/2019 at 9:30  Preventive Care 28 Years and Older, Male Preventive care refers to lifestyle choices and visits with your health care provider that can promote health and wellness. What does preventive care include?  A yearly physical exam. This is also called an annual well check.  Dental exams once or twice a year.  Routine eye exams. Ask your health care provider how often you should have your eyes checked.  Personal lifestyle choices, including:  Daily care of your teeth and gums.  Regular physical activity.  Eating a healthy diet.  Avoiding tobacco and drug use.  Limiting alcohol use.  Practicing safe sex.  Taking low doses of aspirin every day.  Taking vitamin and mineral supplements as recommended by your health care provider. What happens during an annual well check? The services and screenings done by your health care provider during your annual well check will depend on your age, overall health, lifestyle risk factors, and family history of disease. Counseling  Your health care provider may ask you questions about your:  Alcohol  use.  Tobacco use.  Drug use.  Emotional well-being.  Home and relationship well-being.  Sexual activity.  Eating habits.  History of falls.  Memory and ability to understand (cognition).  Work and work Statistician. Screening  You may have the following tests or measurements:  Height, weight, and BMI.  Blood pressure.  Lipid and cholesterol levels. These may be checked every 5 years, or more frequently if you are over 39 years old.  Skin check.  Lung cancer screening. You may have this screening every year starting at age 81 if you have a 30-pack-year history of smoking and currently smoke or have quit within the past 15 years.  Fecal occult blood test (FOBT) of the stool. You may have this test every year starting at age 51.  Flexible sigmoidoscopy or colonoscopy. You may have a sigmoidoscopy every 5 years or a colonoscopy every 10 years starting at age 44.  Prostate cancer screening. Recommendations will vary depending on your family history and other risks.  Hepatitis C blood test.  Hepatitis B blood test.  Sexually transmitted disease (STD) testing.  Diabetes screening. This is done by checking your blood sugar (glucose) after you have not eaten for a while (fasting). You may have this done every 1-3 years.  Abdominal aortic aneurysm (AAA) screening. You may need this if you are a current or former smoker.  Osteoporosis. You may be screened starting at age 78 if you are at high risk. Talk with your health care provider about your test results, treatment options, and if necessary, the need for more tests. Vaccines  Your health care provider may recommend certain vaccines, such as:  Influenza vaccine. This is recommended every year.  Tetanus, diphtheria, and acellular pertussis (Tdap, Td) vaccine. You may need a Td booster every 10 years.  Zoster vaccine. You may need this after age 31.  Pneumococcal 13-valent conjugate (PCV13) vaccine. One dose is  recommended after age 15.  Pneumococcal polysaccharide (PPSV23) vaccine. One dose is recommended after age 79. Talk to your health care provider about which screenings and vaccines you need and how often you need them. This information is not intended to replace advice given to you by your health care provider. Make sure you discuss any questions you have with your health care provider. Document Released: 01/22/2015 Document Revised: 09/15/2015 Document Reviewed: 10/27/2014 Elsevier Interactive Patient Education  2017 [place] Prevention in the Home Falls can cause injuries. They can happen to people of all ages. There are many things you can do to make your home safe and to help prevent falls. What can I do on the outside of my home?  Regularly fix the edges of walkways and driveways and fix any cracks.  Remove anything that might make you trip as you walk through a door, such as a raised step or threshold.  Trim any bushes or trees on the path to your home.  Use bright outdoor lighting.  Clear any walking paths of anything that might make someone trip, such as rocks or tools.  Regularly check to see if handrails are loose or broken. Make sure that both sides of any steps have handrails.  Any raised decks and porches should have guardrails on the edges.  Have any leaves, snow, or ice cleared regularly.  Use sand or salt on walking paths during winter.  Clean up any spills in your garage right away. This includes oil or grease spills. What can I do in the bathroom?  Use night lights.  Install grab bars by the toilet and in the tub and shower. Do not use towel bars as grab bars.  Use non-skid mats or decals in the tub or shower.  If you need to sit down in the shower, use a plastic, non-slip stool.  Keep the floor dry. Clean up any water that spills on the floor as soon as it happens.  Remove soap buildup in the tub or shower regularly.  Attach bath mats  securely with double-sided non-slip rug tape.  Do not have throw rugs and other things on the floor that can make you trip. What can I do in the bedroom?  Use night lights.  Make sure that you have a light by your bed that is easy to reach.  Do not use any sheets or blankets that are too big for your bed. They should not hang down onto the floor.  Have a firm chair that has side arms. You can use this for support while you get dressed.  Do not have throw rugs and other things on the floor that can make you trip. What can I do in the kitchen?  Clean up any spills right away.  Avoid walking on wet floors.  Keep items that you use a lot in easy-to-reach places.  If you need to reach something above you, use a strong step stool that has a grab bar.  Keep electrical cords out of the way.  Do not use floor polish or wax that makes floors slippery. If you must use wax, use non-skid floor wax.  Do not have throw rugs and other things on the floor that can make you trip. What can I do  with my stairs?  Do not leave any items on the stairs.  Make sure that there are handrails on both sides of the stairs and use them. Fix handrails that are broken or loose. Make sure that handrails are as long as the stairways.  Check any carpeting to make sure that it is firmly attached to the stairs. Fix any carpet that is loose or worn.  Avoid having throw rugs at the top or bottom of the stairs. If you do have throw rugs, attach them to the floor with carpet tape.  Make sure that you have a light switch at the top of the stairs and the bottom of the stairs. If you do not have them, ask someone to add them for you. What else can I do to help prevent falls?  Wear shoes that:  Do not have high heels.  Have rubber bottoms.  Are comfortable and fit you well.  Are closed at the toe. Do not wear sandals.  If you use a stepladder:  Make sure that it is fully opened. Do not climb a closed  stepladder.  Make sure that both sides of the stepladder are locked into place.  Ask someone to hold it for you, if possible.  Clearly mark and make sure that you can see:  Any grab bars or handrails.  First and last steps.  Where the edge of each step is.  Use tools that help you move around (mobility aids) if they are needed. These include:  Canes.  Walkers.  Scooters.  Crutches.  Turn on the lights when you go into a dark area. Replace any light bulbs as soon as they burn out.  Set up your furniture so you have a clear path. Avoid moving your furniture around.  If any of your floors are uneven, fix them.  If there are any pets around you, be aware of where they are.  Review your medicines with your doctor. Some medicines can make you feel dizzy. This can increase your chance of falling. Ask your doctor what other things that you can do to help prevent falls. This information is not intended to replace advice given to you by your health care provider. Make sure you discuss any questions you have with your health care provider. Document Released: 10/22/2008 Document Revised: 06/03/2015 Document Reviewed: 01/30/2014 Elsevier Interactive Patient Education  2017 [employer].

## 2019-05-28 NOTE — Progress Notes (Signed)
This visit occurred during the SARS-CoV-2 public health emergency.  Safety protocols were in place, including screening questions prior to the visit, additional usage of staff PPE, and extensive cleaning of exam room while observing appropriate contact time as indicated for disinfecting solutions.  Subjective:   Justin Holloway is a 86 y.o. male who presents for Medicare Annual/Subsequent preventive examination.  Review of Systems:  n/a Cardiac Risk Factors include: advanced age (>65mn, >>49women);diabetes mellitus;hypertension;male gender;obesity (BMI >30kg/m2)     Objective:    Vitals: BP 118/64 (BP Location: Left Arm, Patient Position: Sitting)   Pulse 93   Temp 98.3 F (36.8 C) (Oral)   Ht 5' 5" (1.651 m)   Wt 222 lb (100.7 kg)   BMI 36.94 kg/m   Body mass index is 36.94 kg/m.  Advanced Directives 05/28/2019 10/21/2018 08/29/2018 04/17/2018 12/26/2017 09/09/2017 09/08/2017  Does Patient Have a Medical Advance Directive? _0  No No  Type of Advance Directive - - - - - - -  Does patient want to make changes to medical advance directive? - - - - - - -  Copy of [place] Chart? - - - - - - -  Would patient like information on creating a medical advance directive? No - Patient declined No - Patient declined - No - Patient declined No - Patient declined No - Patient declined No - Patient declined    Tobacco Social History   Tobacco Use  Smoking Status Former Smoker  . Types: Cigars  Smokeless Tobacco Never Used     Counseling given: Not Answered   Clinical Intake:  Pre-visit preparation completed: Yes  Pain : No/denies pain     Nutritional Status: BMI > 30  Obese Nutritional Risks: None Diabetes: Yes  How often do you need to have someone help you when you read instructions, pamphlets, or other written materials from your doctor or pharmacy?: 1 - Never     Information entered by :: NAllen LPN  Past Medical History:  Diagnosis  Date  . Arthritis   . CKD (chronic kidney disease), stage III   . Diabetes mellitus without complication ([place]   . Glaucoma    "blurred vision", see Dr SGershon Craneyearly  . Hyperlipidemia   . Hypertension   . TIA (transient ischemic attack)    Past Surgical History:  Procedure Laterality Date  . INGUINAL HERNIA REPAIR Left 06/09/2013   Procedure: OPEN REPAIR LEFT INGUINAL HERNIA;  Surgeon: HAdin Hector MD;  [facility]  Service: General;  Laterality: Left;  . INSERTION OF MESH Left 06/09/2013   Procedure: INSERTION OF MESH;  Surgeon: HAdin Hector MD;  [facility]  Service: General;  Laterality: Left;   Family History  Problem Relation Age of Onset  . Cancer Mother 472      breast  . COPD Father 84      Congestive heart failure   Social History   Socioeconomic History  . Marital status: Married    Spouse name: SKatharine Look . Number of children: 8  . Years of education: 11 . Highest education level: Not on file  Occupational History  . Occupation: retired  Tobacco Use  . Smoking status: Former Smoker    Types: Cigars  . Smokeless tobacco: Never Used  Substance and Sexual Activity  . Alcohol use: Never  . Drug use: No  . Sexual activity: Not Currently  Other Topics Concern  . Not  on file  Social History Narrative   Lives with wife at home    caffeine- coffee 1 cup daily   Social Determinants of Health   Financial Resource Strain: Low Risk   . Difficulty of Paying Living Expenses: Not hard at all  Food Insecurity: No Food Insecurity  . Worried About Charity fundraiser in the Last Year: Never true  . Ran Out of Food in the Last Year: Never true  Transportation Needs: No Transportation Needs  . Lack of Transportation (Medical): No  . Lack of Transportation (Non-Medical): No  Physical Activity: Insufficiently Active  . Days of Exercise per Week: 7 days  . Minutes of Exercise per Session: 20 min  Stress: No Stress Concern Present  . Feeling of Stress :  Not at all  Social Connections:   . Frequency of Communication with Friends and Family:   . Frequency of Social Gatherings with Friends and Family:   . Attends Religious Services:   . Active Member of Clubs or Organizations:   . Attends Archivist Meetings:   [name] Marital Status:     Outpatient Encounter Medications as of 05/28/2019  Medication Sig  . Ascorbic Acid (VITAMIN C) 1000 MG tablet Take 1,000 mg by mouth daily.  [name] aspirin EC 81 MG tablet Take 81 mg by mouth every evening.  . Blood Glucose Monitoring Suppl (ONE TOUCH ULTRA 2) w/Device KIT Use as directed to check blood sugars 2 times per day dx: e11.22  . cephALEXin (KEFLEX) 500 MG capsule Take 1 capsule (500 mg total) by mouth 4 (four) times daily for 7 days.  . cholecalciferol (VITAMIN D) 1000 units tablet Take 2,000 Units by mouth 2 (two) times daily.   . ferrous sulfate 325 (65 FE) MG tablet TAKE 1 TABLET BY MOUTH DAILY  . furosemide (LASIX) 80 MG tablet Take '80mg'$  in the AM and '40mg'$  in the early evening.  [name] glucose blood (ONETOUCH ULTRA) test strip USE STRIPS TO TEST BLOOD SUGAR 3 TIME(S) DAILY, ONE STRIP PER TEST  . hydrALAZINE (APRESOLINE) 25 MG tablet Take 1 tablet (25 mg total) by mouth 3 (three) times daily.  . Insulin Degludec (TRESIBA FLEXTOUCH) 200 UNIT/ML SOPN Inject 25 Units into the skin at bedtime.  [name] lactulose (CEPHULAC) 10 g packet Take 1 packet (10 g total) by mouth 3 (three) times daily.  . Lancets (ONETOUCH DELICA PLUS [id number]) MISC 100 each by Does not apply route in the morning, at noon, and at bedtime.  [name] LUMIGAN 0.01 % SOLN Place 1 drop into both eyes 2 (two) times daily.   . meclizine (ANTIVERT) 12.5 MG tablet TAKE 1 TABLET BY MOUTH THREE TIMES DAILY AS NEEDED FOR DIZZINESS  . Multiple Vitamin (MULTIVITAMIN WITH MINERALS) TABS tablet Take 1 tablet by mouth daily.  . pravastatin (PRAVACHOL) 40 MG tablet TAKE 1 TABLET BY MOUTH DAILY  . pregabalin (LYRICA) 25 MG capsule Take 25 mg by mouth daily.    . Semaglutide,0.25 or 0.'5MG'$ /DOS, (OZEMPIC, 0.25 OR 0.5 MG/DOSE,) 2 MG/1.5ML SOPN Inject 0.5 mg into the skin once a week.   No facility-administered encounter medications on file as of 05/28/2019.    Activities of Daily Living In your present state of health, do you have any difficulty performing the following activities: 05/28/2019 08/29/2018  Hearing? N N  Vision? N N  Difficulty concentrating or making decisions? N N  Walking or climbing stairs? Y Y  Dressing or bathing? N N  Doing errands, shopping? N N  Preparing Food and eating ? N N  Using the Toilet? N N  In the past six months, have you accidently leaked urine? Y Y  Comment - wears pads  Do you have problems with loss of bowel control? N N  Managing your Medications? N N  Managing your Finances? N N  Housekeeping or managing your Housekeeping? N N  Some recent data might be hidden    Patient Care Team: [name], MD as PCP - General (Internal Medicine) Marylynn Pearson, MD as Consulting Physician (Ophthalmology) Daneen Schick as Social Worker [place], [name], RN as Case Manager [place], Kennieth Francois, [facility] (Pharmacist)   Assessment:   This is a routine wellness examination for Josimar.  Exercise Activities and Dietary recommendations Current Exercise Habits: Home exercise routine, Type of exercise: strength training/weights, Time (Minutes): 20, Frequency (Times/Week): 7, Weekly Exercise (Minutes/Week): 140  Goals    . "I can't afford my diabetic medicines" (pt-stated)     Current Barriers:  [name] Knowledge Deficits related to resources to assist with cost of medications to help manage chronic conditions such as diabetes and neuropathy  CCM PharmD Clinical Goal(s):  [name] Over the next 30 days, patient will work with CCM team to address needs related to cost of Ozempic and [place].  Interventions:   Completed CCM PharmD telephone outreach with patient on 02/12/19  Evaluation of current treatment plan related to  pharmacological treatment for Diabetes and patient's adherence to plan as established by provider . Reviewed medications with patient-->updated medication list per patient report & dispense history . Discussed plans with patient for ongoing care management follow up and provided patient with direct contact information for care management team . Collaboration with [facility] CPhT, Etter Sjogren for patient assistance programs for [employer] through [employer] (provides free pen needles as well).  Patient qualifies based on initial screening.  Medications will be delivered to PCP office once approved & shipped. . 02/26/19--patient approved for [employer] (ozempic and tresiba) patient picked up 65-monthsupply.  Refill request will need to be sent in April 2021   Patient Self Care Activities:   VRosezena Sensorunderstanding of the education/information provided today . Self administers medications as prescribed . Attends all scheduled provider appointments . Calls pharmacy for medication refills . Performs ADL's independently . Performs IADL's independently . Calls provider office for new concerns or questions  Please see past updates related to this goal by clicking on the "Past Updates" button in the selected goal       . "I know my kidney function is not good" (pt-stated)     Current Barriers:  .Marland KitchenKnowledge Deficits related to disease process and treatment mangement for renal insufficiency/CKD  Nurse Case Manager Clinical Goal(s):  .Marland KitchenOver the next 60 days, patient will verbalize basic understanding of chronic kidney disease process and self health management plan as evidenced by patient will be able to verbalize what CKD is and how it is best management to help slow down progression and or ESRD.  CCM RN CM Interventions:  09/17/18: Completed call with patient  . Evaluation of current treatment plan related to stage 4 CKD and patient's adherence to plan as established by  provider. . Provided education to patient re: recent CMP results that show persistent decreased renal function . Discussed plans with patient for ongoing care management follow up and provided patient with direct contact information for care management teambasic disease process and treatment management   Patient Self Care Activities:  .  Self administers medications as prescribed . Attends all scheduled provider appointments . Calls pharmacy for medication refills . Attends church or other social activities . Performs ADL's independently . Performs IADL's independently . Calls provider office for new concerns or questions  Initial goal documentation     . "I need to get my A1C under 7" (pt-stated)     Current Barriers:  [name] Knowledge Deficits related to disease process and Self Health management of Diabetes  Nurse Case Manager Clinical Goal(s):  [name] Over the next 90 days, patient will verbalize basic understanding of Diabetes Mellitus, type 2 disease process and self health management plan as evidenced by patient will understand how to meal plan with low carbs and use portion control and the plate method for best dietary management of Diabetes. 06/28/18 re-established goal date to 90 days due to COVID-19 treatment delays. Goal Met  . 12/17/18 New - Over the next 90 days, patient will maintain and or lower his A1C < 7.4  CCM RN CM Interventions:  12/17/18 Completed call with patient   . Evaluation of adherence to current treatment plan related to Diabetes and patient's adherence to plan as established by provider . Discussed patient is adhering to his prescribed Diabetic regimen, including following his diet and taking his prescribed medications . Discussed Mr. Lybbert has one Ozempic injection left to get him through this week, he would like a sample from the office if available . Collaboration with Dr. Baird Cancer and embedded Pharm D [name] regarding patient's request for a sample -  discussed Mr. Xiong should be able to get his refill via [employer] and Rhea Bleacher D will follow up with Mr. Ager  . Discussed plans with patient for ongoing care management follow up and provided patient with direct contact information for care management team  Patient Self Care Activities:  . Self administers medications as prescribed . Attends all scheduled provider appointments . Calls pharmacy for medication refills . Performs ADL's independently . Performs IADL's independently . Calls provider office for new concerns or questions  Please see past updates related to this goal by clicking on the "Past Updates" button in the selected goal       . HEMOGLOBIN A1C < 7.0 (pt-stated)    . I would like to continue managing my diabetes and get my A1c <7 (pt-stated)     Current Barriers:  . Diabetes: T2DM; most recent A1c 7.0% on 02/26/19 (was 7.9% on 8/20, 9.2% on 06/10/2018)  . Current antihyperglycemic regimen: Tresiba 25 units qHS, Ozempic once weekly sq on Fridays. One touch ultra glucometer o Ozempic/Tresiba PAP submitted to [facility] [facility]--4 months supply arrived today 01/15/2019; refill request to be sent in April 2021 (medication will ship to office) . Denies hypoglycemic symptoms; denies hyperglycemic symptoms . Current exercise: walking as able . Current blood glucose readings: patient states FBG<120 in the AM (none <70) . Cardiovascular risk reduction: o Current hypertensive regimen: hydralazine TID o Current hyperlipidemia regimen: pravastatin '40mg'$  daily (last filled #90 in 01/2019); LDL increased to 102 on 02/26/19 o Watch Scr and insulin/need for medication adjustments.  Scr last 3.02 on 02/26/19 (decreased from 3.17 On 08/29/18)  Pharmacist Clinical Goal(s):  [name] Over the next 90 days, patient with work with PharmD and primary care provider to address optimization of medication management of chronic conditions.  Interventions: Call completed on 02/26/19 . Comprehensive  medication review performed, medication list updated in electronic medical record.   . Reviewed & discussed the following diabetes-related  information with patient: o Follow ADA recommended "diabetes-friendly" diet  (reviewed healthy snack/food options) o Discussed insulin/GLP-1 injection technique o Reviewed medication purpose/side effects-->patient denies adverse events  Patient Self Care Activities:  . Patient will check blood glucose daily , document, and provide at future appointments . Patient will focus on medication adherence by taking insulin and GLP-1 as prescribed. . Patient will take medications as prescribed . Patient will contact provider with any episodes of hypoglycemia . Patient will report any questions or concerns to provider   Please see past updates related to this goal by clicking on the "Past Updates" button in the selected goal       . My gabapentin is making me itch.  I would like to try a different medication for my chronic neuropathy. (pt-stated)     Current Barriers:  . Patient reports "itching" with gabapentin therapy.  He is currently taking gabapentin '200mg'$  qHS.  Pharmacist Clinical Goal(s):  [name] Over the next 45 days, patient will work with PharmD and provider towards optimized medication management Goal Met . New - 12/12/18 Over the next 90 days, patient will have completed his MD f/u with [name] regarding treatment recommendations for his Diabetic Neuropathy . New - 12/12/18 Over the next 90 days, patient will report improved daily glycemic control as evidence by patient will reports CBG's are consistently ranging from 80-130  CCM PharmD Interventions: 02/11/19: Call completed with patient and wife, Alondra Sahni . Comprehensive medication review performed; medication list updated in electronic medical record . Discussed with patient and MD to work to find an alternative for patient's peripheral neuropathy.  Would avoid TCAs (amitriptyline) given  patient's age and potential side effects.  Could consider increase dose if this is a symptom of uncontrolled neuropathy.  Patient states he has not "gotten relief" from taking gabapentin.  Could consider topical agent (lidocaine/capsacin) or low dose Lyrica capsule as therapeutic alternatives.  Recardo Evangelist '25mg'$  daily (dose based on renal function) . Patient reports Lyrica is too expensive.  Lyrica has been delivered to patient's home.  Patient is requesting to see neurologist, therefore will hold on reapply or adjusting Lyrica. o Patient states Lyrica has helped "only a little bit", but he continues to have some burning/tingling in his toes. o He is being referred to Dr. Areatha Keas placed and number given to patient wife to call/set up appt on 02/11/19 . [name]  CCM RN CM Interventions: 01/22/19 completed call with patient  . Inbound call received from Mr. Derwin stating he was told [name] did not receive a new patient referral from Dr. Baird Cancer . Per chart review, it is noted that Dr. Baird Cancer entered a referral on 12/04/18 . Placed outbound call to Dr. Si Gaul office, spoke with Cecille Rubin who advised the referral was received but that [name] would like additional clinical documentation to support the referral  . Sent an in basket message to Dr. Baird Cancer with this notification and the fax# where supporting documentation can be sent . Placed outbound call to Mr. Albrecht to make him aware and he verbalizes understanding  Patient Self Care Activities:  . Patient will take medications as prescribed . Patient open to an alternative for his peripheral neuropathy  Please see past updates related to this goal by clicking on the "Past Updates" button in the selected goal      . Patient Stated     08/29/2018, to eat healthy    . Patient Stated     05/28/2019,  wants to keep diabetes numbers down       Fall Risk Fall Risk  05/28/2019 04/28/2019 08/29/2018 05/28/2018  12/26/2017  Falls in the past year? 0 0 0 0 0  Risk for fall due to : Medication side effect - Impaired balance/gait;Medication side effect - -  Follow up Falls evaluation completed;Education provided;Falls prevention discussed - Falls evaluation completed;Education provided;Falls prevention discussed - -   Is the patient's home free of loose throw rugs in walkways, pet beds, electrical cords, etc?   yes      Grab bars in the bathroom? yes      Handrails on the stairs?   n/a      Adequate lighting?   yes  Timed Get Up and Go Performed: n/a  Depression Screen PHQ 2/9 Scores 05/28/2019 04/28/2019 08/29/2018 04/17/2018  PHQ - 2 Score 0 0 0 0  PHQ- 9 Score 0 - 3 -    Cognitive Function     6CIT Screen 05/28/2019 08/29/2018 12/26/2017  What Year? 0 points 0 points 0 points  What month? 0 points 0 points 0 points  What time? 0 points 0 points 0 points  Count back from 20 4 points 4 points 0 points  Months in reverse 4 points 4 points 4 points  Repeat phrase 0 points 2 points 2 points  Total Score _0 Immunization History  Administered Date(s) Administered  . Influenza, High Dose Seasonal PF 11/21/2017, 08/29/2018  . PFIZER SARS-COV-2 Vaccination 02/01/2019, 03/06/2019  . Pneumococcal Polysaccharide-23 12/10/1998    Qualifies for Shingles Vaccine? yes  Screening Tests Health Maintenance  Topic Date Due  . OPHTHALMOLOGY EXAM  Never done  . INFLUENZA VACCINE  08/10/2019  . HEMOGLOBIN A1C  08/26/2019  . FOOT EXAM  02/18/2020  . TETANUS/TDAP  11/12/2022  . COVID-19 Vaccine  Completed  . PNA vac Low Risk Adult  Completed   Cancer Screenings: Lung: Low Dose CT Chest recommended if Age 48-80 years, 30 pack-year currently smoking OR have quit w/in 15years. Patient does not qualify. Colorectal: not required  Additional Screenings:  Hepatitis C Screening:n/a      Plan:    Patient wants to keep diabetes numbers down.  I have personally reviewed and noted the following in  the patient's chart:   . Medical and social history . Use of alcohol, tobacco or illicit drugs  . Current medications and supplements . Functional ability and status . Nutritional status . Physical activity . Advanced directives . List of other physicians . Hospitalizations, surgeries, and ER visits in previous 12 months . Vitals . Screenings to include cognitive, depression, and falls . Referrals and appointments  In addition, I have reviewed and discussed with patient certain preventive protocols, quality metrics, and best practice recommendations. A written personalized care plan for preventive services as well as general preventive health recommendations were provided to patient.     [name], LPN  1/[id number]/[id number]

## 2019-05-28 NOTE — Progress Notes (Signed)
This visit occurred during the SARS-CoV-2 public health emergency.  Safety protocols were in place, including screening questions prior to the visit, additional usage of staff PPE, and extensive cleaning of exam room while observing appropriate contact time as indicated for disinfecting solutions.  Subjective:     Patient ID: Justin Holloway , male    DOB: [birth year] , 86 y.o.   MRN: [id number]   Chief Complaint  Patient presents with  . Diabetes  . Hypertension    HPI  He presents today for diabetes/Bp check. He reports compliance with meds. He denies shortness of breath and palpitations.   Diabetes He presents for his follow-up diabetic visit. He has type 2 diabetes mellitus. His disease course has been stable. There are no hypoglycemic associated symptoms. Associated symptoms include chest pain. Pertinent negatives for diabetes include no blurred vision. There are no hypoglycemic complications. Risk factors for coronary artery disease include dyslipidemia, hypertension, diabetes mellitus, male sex, sedentary lifestyle and obesity. He is compliant with treatment most of the time. He is following a generally healthy diet. He participates in exercise intermittently. Eye exam is not current.  Hypertension This is a chronic problem. The current episode started more than 1 year ago. The problem is controlled. Associated symptoms include chest pain. Pertinent negatives include no blurred vision, palpitations or shortness of breath. Past treatments include diuretics. The current treatment provides moderate improvement. Hypertensive end-organ damage includes kidney disease.     Past Medical History:  Diagnosis Date  . Arthritis   . CKD (chronic kidney disease), stage III   . Diabetes mellitus without complication ([place])   . Glaucoma    "blurred vision", see Dr Gershon Crane yearly  . Hyperlipidemia   . Hypertension   . TIA (transient ischemic attack)      Family History  Problem Relation Age  of Onset  . Cancer Mother 12       breast  . COPD Father 41       Congestive heart failure     Current Outpatient Medications:  .  Ascorbic Acid (VITAMIN C) 1000 MG tablet, Take 1,000 mg by mouth daily., Disp: , Rfl:  .  aspirin EC 81 MG tablet, Take 81 mg by mouth every evening., Disp: , Rfl:  .  Blood Glucose Monitoring Suppl (ONE TOUCH ULTRA 2) w/Device KIT, Use as directed to check blood sugars 2 times per day dx: e11.22, Disp: 1 kit, Rfl: 0 .  cephALEXin (KEFLEX) 500 MG capsule, Take 1 capsule (500 mg total) by mouth 4 (four) times daily for 7 days., Disp: 28 capsule, Rfl: 0 .  cholecalciferol (VITAMIN D) 1000 units tablet, Take 2,000 Units by mouth 2 (two) times daily. , Disp: , Rfl:  .  ferrous sulfate 325 (65 FE) MG tablet, TAKE 1 TABLET BY MOUTH DAILY, Disp: , Rfl:  .  furosemide (LASIX) 80 MG tablet, Take 25m in the AM and 451min the early evening., Disp: 180 tablet, Rfl: 0 .  glucose blood (ONETOUCH ULTRA) test strip, USE STRIPS TO TEST BLOOD SUGAR 3 TIME(S) DAILY, ONE STRIP PER TEST, Disp: 100 each, Rfl: 2 .  hydrALAZINE (APRESOLINE) 25 MG tablet, Take 1 tablet (25 mg total) by mouth 3 (three) times daily., Disp: 270 tablet, Rfl: 0 .  Insulin Degludec (TRESIBA FLEXTOUCH) 200 UNIT/ML SOPN, Inject 25 Units into the skin at bedtime., Disp: , Rfl:  .  Lancets (ONETOUCH DELICA PLUS [id number], 100 each by Does not apply route in the morning, at noon,  and at bedtime., Disp: 300 each, Rfl: 3 .  LUMIGAN 0.01 % SOLN, Place 1 drop into both eyes 2 (two) times daily. , Disp: , Rfl:  .  meclizine (ANTIVERT) 12.5 MG tablet, TAKE 1 TABLET BY MOUTH THREE TIMES DAILY AS NEEDED FOR DIZZINESS, Disp: 20 tablet, Rfl: 0 .  Multiple Vitamin (MULTIVITAMIN WITH MINERALS) TABS tablet, Take 1 tablet by mouth daily., Disp: , Rfl:  .  pravastatin (PRAVACHOL) 40 MG tablet, TAKE 1 TABLET BY MOUTH DAILY, Disp: 90 tablet, Rfl: 1 .  pregabalin (LYRICA) 25 MG capsule, Take 25 mg by mouth daily., Disp: ,  Rfl:  .  Semaglutide,0.25 or 0.5MG/DOS, (OZEMPIC, 0.25 OR 0.5 MG/DOSE,) 2 MG/1.5ML SOPN, Inject 0.5 mg into the skin once a week., Disp: 1.5 mL, Rfl: 1 .  lactulose (CEPHULAC) 10 g packet, Take 1 packet (10 g total) by mouth 3 (three) times daily., Disp: 30 each, Rfl: 0   Allergies  Allergen Reactions  . Gabapentin Itching     Review of Systems  Constitutional: Negative.   Eyes: Negative for blurred vision.  Respiratory: Negative.  Negative for shortness of breath.   Cardiovascular: Positive for chest pain. Negative for palpitations.       At end of visit, he adds he has been having left sided chest pain. Described as sharp, shooting. No associated SOB and palpitations. Occurs at rest. Denies exertional symptoms. Denies belching and excessive gas. Unable to determine what triggers his sx.  Gastrointestinal: Negative.   Neurological: Negative.   Psychiatric/Behavioral: Negative.      Today's Vitals   05/28/19 1139  BP: 118/64  Pulse: 93  Temp: 98.3 F (36.8 C)  TempSrc: Oral  Weight: 222 lb 3.2 oz (100.8 kg)  Height: 5' 5"  (1.651 m)  PainSc: 0-No pain   Body mass index is 36.98 kg/m.   Objective:  Physical Exam Vitals and nursing note reviewed.  Constitutional:      Appearance: Normal appearance. He is obese.  Cardiovascular:     Rate and Rhythm: Normal rate and regular rhythm.     Heart sounds: Normal heart sounds.  Pulmonary:     Effort: Pulmonary effort is normal.     Breath sounds: Normal breath sounds.  Skin:    General: Skin is warm.  Neurological:     General: No focal deficit present.     Mental Status: He is alert.     Comments: He has shuffling gait  Psychiatric:        Mood and Affect: Mood normal.         Assessment And Plan:     1. Type 2 diabetes mellitus with stage 4 chronic kidney disease, with long-term current use of insulin (HCC)  Chronic, I will check an a1c today. He had labwork with another provider recently, renal function reviewed. I  will not check BMP today.   - Hemoglobin A1c  2. Hypertensive nephropathy  Chronic, well controlled. He will continue with current meds for now. Encouraged to avoid adding salt to his foods.   3. Peripheral vascular disease, unspecified (HCC)  Chronic, yet stable. Importance of daily activity and statin compliance was discussed with the patient.   4. Left-sided chest pain  Atypical. EKG performed, NSR w/o acute changes. I will also check CPK today. I do not think his sx are cardiac in nature. He will let me know if his sx recur.   - CK, total - EKG 12-Lead  5. Class 2 severe obesity due to  excess calories with serious comorbidity and body mass index (BMI) of 36.0 to 36.9 in adult [facility])  He is encouraged to lose 11-20 pounds by the end of the year to help him decrease cardiac risk. Also reminded to perform chair exercises.  [name], MD    THE PATIENT IS ENCOURAGED TO PRACTICE SOCIAL DISTANCING DUE TO THE COVID-19 PANDEMIC.

## 2019-06-02 ENCOUNTER — Telehealth: Payer: Self-pay

## 2019-06-02 ENCOUNTER — Ambulatory Visit (INDEPENDENT_AMBULATORY_CARE_PROVIDER_SITE_OTHER): Payer: Medicare Other

## 2019-06-02 ENCOUNTER — Other Ambulatory Visit: Payer: Self-pay

## 2019-06-02 DIAGNOSIS — N184 Chronic kidney disease, stage 4 (severe): Secondary | ICD-10-CM | POA: Diagnosis not present

## 2019-06-02 DIAGNOSIS — I129 Hypertensive chronic kidney disease with stage 1 through stage 4 chronic kidney disease, or unspecified chronic kidney disease: Secondary | ICD-10-CM | POA: Diagnosis not present

## 2019-06-02 DIAGNOSIS — E119 Type 2 diabetes mellitus without complications: Secondary | ICD-10-CM

## 2019-06-04 ENCOUNTER — Telehealth: Payer: Medicare Other

## 2019-06-04 NOTE — Chronic Care Management (AMB) (Deleted)
 Chronic Care Management Pharmacy  Name: Justin Holloway  MRN: [id number] DOB: 03/05/1929  Chief Complaint/ HPI  Justin Holloway,  86 y.o. , male presents for their Initial CCM visit with the clinical pharmacist via telephone due to COVID-19 Pandemic.  PCP : Sanders, Robyn, MD  Their chronic conditions include: Hypertension, Diabetes, Hyperlipidemia  Office Visits: 05/28/19 OV: Presented for DM and HTN check. Labs ordered (HgbA1c, CK total). EKG performed due to complaint of chest pain; showed no acute changes. Encouraged pt to lose 11-20 lbs to decrease cardiac risk. Encouraged chair exercises.   04/28/19 OV w/ J. Moore: Presented with complaints of dysuria. Was treated for UTI 2 months ago. Administered ceftriaxone 1g injection for nitrates in urine. Ordered urine culture.   02/26/19 OV: Presented for DM and HTN check. Pt complained of urinary frequency. Pt wanted to stop diuretic, but advised not to at this time. Labs ordered (CMP14+EGFR, HgbA1c, Lipid panel, Urinlaysis, Urine culture). No relief from diabetic neuropathy with Lyrica or gabapentin. Referred to Pain Clnic  Consult Visits: 05/21/19 ED visit: Presented for abdominal noises occurring intermittently for weeks. Pt did not report abdominal pain at this time. Urgent Care Xray showed an ileus. Started on antibiotics for UTI. Recommended to follow up with PCP.  05/21/19 Urgent Care OV: Presented for right sided lower back pain beginning a couple weeks ago and abdominal pain (no BM yesterday, only air) beginning 1 week ago. Urine culture and abdominal Xray performed. Referred to ED for further evaluation due to concern for ileus.   02/18/19 Podiatry OV w/ Dr. Mayer: Presented for preventative foot care services. Debridement performed. Follow up in 3 months for continued evaluation and treatment.   CCM Encounters: 02/26/19 PharmD: Pt approved to receive Ozempic and Tresiba though Novo Nordisk. Comprehensive medication review  performed  02/12/19 PharmD: Comprehensive medication review performed. Pt reported only receiving little benefit with Lyrica for nerve pain.   01/22/19 RN: Coordination of care regarding referral to pain clinic.   01/15/19 PharmD: Dietary and exercise recommendations provided. Comprehensive medication review performed  12/17/18 PharmD and RN: Pt started on Lyrica 25mg with mile improvement. Medication too expensive, will help apply for PAP. Evaluation of current treatment plans relating to chronic conditions.  Medications: Outpatient Encounter Medications as of 06/04/2019  Medication Sig Note  . Ascorbic Acid (VITAMIN C) 1000 MG tablet Take 1,000 mg by mouth daily.   . aspirin EC 81 MG tablet Take 81 mg by mouth every evening.   . Blood Glucose Monitoring Suppl (ONE TOUCH ULTRA 2) w/Device KIT Use as directed to check blood sugars 2 times per day dx: e11.22   . cholecalciferol (VITAMIN D) 1000 units tablet Take 2,000 Units by mouth 2 (two) times daily.    . ferrous sulfate 325 (65 FE) MG tablet TAKE 1 TABLET BY MOUTH DAILY   . furosemide (LASIX) 80 MG tablet Take 80mg in the AM and 40mg in the early evening.   . glucose blood (ONETOUCH ULTRA) test strip USE STRIPS TO TEST BLOOD SUGAR 3 TIME(S) DAILY, ONE STRIP PER TEST   . hydrALAZINE (APRESOLINE) 25 MG tablet Take 1 tablet (25 mg total) by mouth 3 (three) times daily.   . Insulin Degludec (TRESIBA FLEXTOUCH) 200 UNIT/ML SOPN Inject 25 Units into the skin at bedtime.   . lactulose (CEPHULAC) 10 g packet Take 1 packet (10 g total) by mouth 3 (three) times daily.   . Lancets (ONETOUCH DELICA PLUS LANCET33G) MISC 100 each by Does not   apply route in the morning, at noon, and at bedtime.   . LUMIGAN 0.01 % SOLN Place 1 drop into both eyes 2 (two) times daily.    . meclizine (ANTIVERT) 12.5 MG tablet TAKE 1 TABLET BY MOUTH THREE TIMES DAILY AS NEEDED FOR DIZZINESS   . Multiple Vitamin (MULTIVITAMIN WITH MINERALS) TABS tablet Take 1 tablet by mouth  daily.   . pravastatin (PRAVACHOL) 40 MG tablet TAKE 1 TABLET BY MOUTH DAILY   . pregabalin (LYRICA) 25 MG capsule Take 25 mg by mouth daily. 12/04/2018: Receives patient assistance  . Semaglutide,0.25 or 0.5MG/DOS, (OZEMPIC, 0.25 OR 0.5 MG/DOSE,) 2 MG/1.5ML SOPN Inject 0.5 mg into the skin once a week.    No facility-administered encounter medications on file as of 06/04/2019.    Current Diagnosis/Assessment:    Goals Addressed   None     Diabetes   Recent Relevant Labs: Lab Results  Component Value Date/Time   HGBA1C 7.0 (H) 02/26/2019 10:38 AM   HGBA1C 7.4 (H) 11/20/2018 11:57 AM   MICROALBUR 80 05/28/2019 12:29 PM   MICROALBUR 80 12/26/2017 04:58 PM    Kidney Function Lab Results  Component Value Date/Time   CREATININE 3.16 (H) 05/21/2019 02:00 PM   CREATININE 3.02 (H) 02/26/2019 10:38 AM   GFRNONAA 17 (L) 05/21/2019 02:00 PM   GFRAA 19 (L) 05/21/2019 02:00 PM  Stage 4 CKD  Checking BG: {CHL HP Blood Glucose Monitoring Frequency:2109141001}  Recent FBG Readings: Recent pre-meal BG readings: *** Recent 2hr PP BG readings:  *** Recent HS BG readings: *** Patient has failed these meds in past: Glimepiride, Tanzeum, Actos, Toujeo Patient is currently controlled on the following medications:  -Tresiba 200 units/ml 25 units at bedtime -Ozempic 0.5mg weekly -Aspirin 81mg daily  Last diabetic Foot exam: Followed regularly by podiatry Last diabetic Eye exam: Followed by ophthalmology for glaucoma  We discussed:  -Diet and exercise extensively  Plan Continue current medications   Hypertension   Office blood pressures are  BP Readings from Last 3 Encounters:  05/28/19 118/64  05/28/19 118/64  05/21/19 132/69    Patient has failed these meds in the past: Azilsartan, Losartan/HCTZ Patient is currently {CHL Controlled/Uncontrolled:2109141014} on the following medications: *** -Furosemide 80mg 1 tablet in the morning and 1/2 tablet in the evening -Hydralazine  25mg three times daily  Patient checks BP at home {CHL HP BP Monitoring Frequency:2109141004}  Patient home BP readings are ranging: ***  We discussed: -Diet and exercise extensively   Plan -Continue {CHL HP Upstream Pharmacy Plans:2109141003}     Hyperlipidemia   Lipid Panel     Component Value Date/Time   CHOL 173 02/26/2019 1038   TRIG 69 02/26/2019 1038   HDL 58 02/26/2019 1038   LDLCALC 102 (H) 02/26/2019 1038     The ASCVD Risk score (Goff DC Jr., et al., 2013) failed to calculate for the following reasons:   The 2013 ASCVD risk score is only valid for ages 40 to 79   Patient has failed these meds in past: N/A Patient is currently {CHL Controlled/Uncontrolled:2109141014} on the following medications:  -Pravastatin 40mg daily  We discussed:  -Diet and exercise extensively   Plan -Continue {CHL HP Upstream Pharmacy Plans:2109141003}   Pain   Patient has failed these meds in past: *** Patient is currently {CHL Controlled/Uncontrolled:2109141014} on the following medications:  -Lyrica 25mg daily  We discussed:  {CHL HP Upstream Pharmacy discussion:2109141007}  Plan -Continue {CHL HP Upstream Pharmacy Plans:2109141003}  Over the Counter Medications     Patient has failed these meds in past: *** Patient is currently {CHL Controlled/Uncontrolled:2109141014} on the following medications:  -Vitamin C 1000mg daily -Cholecalciferol 1000 units 2 tablets twice daily -Ferrous sulfate 325mg daily -Meclizine 12.5mg three times daily as needed for dizziness -MVI  We discussed:  {CHL HP Upstream Pharmacy discussion:2109141007}  Plan  Continue {CHL HP Upstream Pharmacy Plans:2109141003}  Glaucoma   Patient has failed these meds in past: *** Patient is currently {CHL Controlled/Uncontrolled:2109141014} on the following medications:  -Lumigan 0.01% soln 1 drop in both eyes twice daily  We discussed:  {CHL HP Upstream Pharmacy  discussion:2109141007}  Plan  Continue {CHL HP Upstream Pharmacy Plans:2109141003}   -Lactulose 10g packet three times daily   Vaccines   Reviewed and discussed patient's vaccination history.    Immunization History  Administered Date(s) Administered  . Influenza, High Dose Seasonal PF 11/21/2017, 08/29/2018  . PFIZER SARS-COV-2 Vaccination 02/01/2019, 03/06/2019  . Pneumococcal Polysaccharide-23 12/10/1998    Plan  Recommended patient receive *** vaccine in *** office/pharmacy.   Medication Management  ***Disad w/ Mail Pt uses [facility] for all medications Uses pill box? {Yes or If no, why not?:20788} Pt endorses ***% compliance  We discussed: ***  Plan  {US Pharmacy Plan:23885}    Follow up: *** month phone visit  Courtney Caudill, PharmD Clinical Pharmacist [facility] [phone]   

## 2019-06-06 ENCOUNTER — Ambulatory Visit: Payer: Medicare Other | Admitting: Cardiology

## 2019-06-10 ENCOUNTER — Telehealth: Payer: Self-pay

## 2019-06-10 DIAGNOSIS — E1122 Type 2 diabetes mellitus with diabetic chronic kidney disease: Secondary | ICD-10-CM | POA: Diagnosis not present

## 2019-06-10 DIAGNOSIS — Z794 Long term (current) use of insulin: Secondary | ICD-10-CM | POA: Diagnosis not present

## 2019-06-10 DIAGNOSIS — R079 Chest pain, unspecified: Secondary | ICD-10-CM | POA: Diagnosis not present

## 2019-06-10 DIAGNOSIS — N184 Chronic kidney disease, stage 4 (severe): Secondary | ICD-10-CM | POA: Diagnosis not present

## 2019-06-10 NOTE — Telephone Encounter (Signed)
I was calling the pt because he didn't stop by the lab to have his lab work drawn at his last visit and when can he come back to have it done.

## 2019-06-11 LAB — HEMOGLOBIN A1C
Est. average glucose Bld gHb Est-mCnc: 146 mg/dL
Hgb A1c MFr Bld: 6.7 % — ABNORMAL HIGH (ref 4.8–5.6)

## 2019-06-11 LAB — CK: Total CK: 138 U/L (ref 30–208)

## 2019-06-12 NOTE — Chronic Care Management (AMB) (Signed)
Chronic Care Management   Follow Up Note   06/03/2019 Name: Justin Holloway MRN: [id number] DOB: 1929-05-13  Referred by: Justin Chard, MD Reason for referral : Chronic Care Management (FU RN CM Call )   Justin Holloway is a 86 y.o. year old male who is a primary care patient of Justin Chard, MD. The CCM team was consulted for assistance with chronic disease management and care coordination needs.    Review of patient status, including review of consultants reports, relevant laboratory and other test results, and collaboration with appropriate care team members and the patient's provider was performed as part of comprehensive patient evaluation and provision of chronic care management services.    SDOH (Social Determinants of Health) assessments performed: Yes - No Acute Challenges Identified at this time See Care Plan activities for detailed interventions related to Justin Holloway)   Placed outbound CCM RN CM follow up call to patient for a care plan update.     Outpatient Encounter Medications as of 06/02/2019  Medication Sig Note  . Ascorbic Acid (VITAMIN C) 1000 MG tablet Take 1,000 mg by mouth daily.   [name] aspirin EC 81 MG tablet Take 81 mg by mouth every evening.   . Blood Glucose Monitoring Suppl (ONE TOUCH ULTRA 2) w/Device KIT Use as directed to check blood sugars 2 times per day dx: e11.22   . cholecalciferol (VITAMIN D) 1000 units tablet Take 2,000 Units by mouth 2 (two) times daily.    . ferrous sulfate 325 (65 FE) MG tablet TAKE 1 TABLET BY MOUTH DAILY   . furosemide (LASIX) 80 MG tablet Take 77m in the AM and 468min the early evening.   . Marland Kitchenlucose blood (ONETOUCH ULTRA) test strip USE STRIPS TO TEST BLOOD SUGAR 3 TIME(S) DAILY, ONE STRIP PER TEST   . hydrALAZINE (APRESOLINE) 25 MG tablet Take 1 tablet (25 mg total) by mouth 3 (three) times daily.   . Insulin Degludec (TRESIBA FLEXTOUCH) 200 UNIT/ML SOPN Inject 25 Units into the skin at bedtime.   . Marland Kitchenactulose (CEPHULAC) 10 g  packet Take 1 packet (10 g total) by mouth 3 (three) times daily.   . Lancets (ONETOUCH DELICA PLUS [id number] 100 each by Does not apply route in the morning, at noon, and at bedtime.   . Marland KitchenUMIGAN 0.01 % SOLN Place 1 drop into both eyes 2 (two) times daily.    . meclizine (ANTIVERT) 12.5 MG tablet TAKE 1 TABLET BY MOUTH THREE TIMES DAILY AS NEEDED FOR DIZZINESS   . Multiple Vitamin (MULTIVITAMIN WITH MINERALS) TABS tablet Take 1 tablet by mouth daily.   . pravastatin (PRAVACHOL) 40 MG tablet TAKE 1 TABLET BY MOUTH DAILY   . pregabalin (LYRICA) 25 MG capsule Take 25 mg by mouth daily. 12/04/2018: Justin Maywoodatient assistance  . Semaglutide,0.25 or 0.5MG/DOS, (OZEMPIC, 0.25 OR 0.5 MG/DOSE,) 2 MG/1.5ML SOPN Inject 0.5 mg into the skin once a week.    No facility-administered encounter medications on file as of 06/02/2019.     Objective:  Lab Results  Component Value Date   HGBA1C 6.7 (H) 06/10/2019   HGBA1C 7.0 (H) 02/26/2019   HGBA1C 7.4 (H) 11/20/2018   Lab Results  Component Value Date   MICROALBUR 80 05/28/2019   LDLCALC 102 (H) 02/26/2019   CREATININE 3.16 (H) 05/21/2019   BP Readings from Last 3 Encounters:  05/28/19 118/64  05/28/19 118/64  05/21/19 132/69    Goals Addressed      Patient Stated   .  COMPLETED: "I can't afford my diabetic medicines" (pt-stated)       Current Barriers:  [name] Knowledge Deficits related to resources to assist with cost of medications to help manage chronic conditions such as diabetes and neuropathy  CCM PharmD Clinical Goal(s):  [name] Over the next 30 days, patient will work with CCM team to address needs related to cost of Ozempic and [place]. Goal Met   Interventions:   Completed CCM PharmD telephone outreach with patient on 02/12/19  Evaluation of current treatment plan related to pharmacological treatment for Diabetes and patient's adherence to plan as established by provider . Reviewed medications with patient-->updated medication list per  patient report & dispense history . Discussed plans with patient for ongoing care management follow up and provided patient with direct contact information for care management team . Collaboration with Justin Holloway CPhT, Justin Holloway for patient assistance programs for [employer] through [employer] (provides free pen needles as well).  Patient qualifies based on initial screening.  Medications will be delivered to PCP office once approved & shipped. . 02/26/19--patient approved for [employer] (ozempic and tresiba) patient picked up 79-monthsupply.  Refill request will need to be sent in April 2021  Patient Self Care Activities:   VRosezena Sensorunderstanding of the education/information provided today . Self administers medications as prescribed . Attends all scheduled provider appointments . Calls pharmacy for medication refills . Performs ADL's independently . Performs IADL's independently . Calls provider office for new concerns or questions  Please see past updates related to this goal by clicking on the "Past Updates" button in the selected goal     . "I know my kidney function is not good" (pt-stated)       Current Barriers:  .Marland KitchenKnowledge Deficits related to disease process and treatment mangement for renal insufficiency/CKD . Chronic Disease Management support and education needs related to DM, CKD Stage 4, Hypertensive Nephropathy  Nurse Case Manager Clinical Goal(s):  . New 06/03/19 Over the next 90 days, patient will verbalize increased understanding and knowledge about what foods to eat with having stage IV CKD and type II Diabetes Mellitus  CCM RN CM Interventions:  06/03/19: Completed call with patient . Evaluation of current treatment plan related to stage IV CKD and patient's adherence to plan as established by provider . Provided education to patient re: recent CMP results that show persistent decreased renal function . Discussed and reviewed disease progression prevention  and how to maintain adequate renal function  . Mailed printed educational materials to patient for review at next call, related to; "6 Ways to be Water Wise", "Diabetes and Kidney Disease: What to Eat?", "The Best Salt Substitute for Kidney Patients" . Discussed plans with patient for ongoing care management follow up and provided patient with direct contact information for care management team  Patient Self Care Activities:  . Self administers medications as prescribed . Attends all scheduled provider appointments . Calls pharmacy for medication refills . Attends church or other social activities . Performs ADL's independently . Performs IADL's independently . Calls provider office for new concerns or questions  Please see past updates related to this goal by clicking on the "Past Updates" button in the selected goal     . "I need to get my A1C under 7" (pt-stated)   On track    Current Barriers:  .Marland KitchenKnowledge Deficits related to disease process and Self Health management of Diabetes . Chronic Disease Management support and education needs related to DM,  CKD Stage 4, Hypertensive Nephropathy  Nurse Case Manager Clinical Goal(s):  [name] New 06/03/19 Over the next 90 days, patient will verbalize plan for modifying factors to prevent or minimize shifts in glucose levels . New 06/03/19 Over the next 90 days, patient will maintain and or lower his A1c = < 7.0 %  CCM RN CM Interventions:  06/03/19 completed call with patient  . Evaluation of adherence to current treatment plan related to Diabetes and patient's adherence to plan as established by provider . Discussed and reviewed patient's current daily glycemic control, patient reports his FBS is within target 80-130; Reviewed and discussed current A1c is at 7.0 % . Reviewed medications with patient, he reports adherence with taking Tresiba 200 units/ml, inject 25 units q hs, Semaglutide inject 0.5 mg once a week . Reinforced importance of  adhering to a diabetic friendly diet, implementing exercise into his daily routine and taking his medications exactly as prescribed w/o missing doses  . Mailed printed educational materials related to Diabetes Care Schedule to patient's home for review at next f/u call  . Discussed plans with patient for ongoing care management follow up and provided patient with direct contact information for care management team  Patient Self Care Activities:  . Self administers medications as prescribed . Attends all scheduled provider appointments . Calls pharmacy for medication refills . Performs ADL's independently . Performs IADL's independently . Calls provider office for new concerns or questions  Please see past updates related to this goal by clicking on the "Past Updates" button in the selected goal     . COMPLETED: I would like to continue managing my diabetes and get my A1c <7 (pt-stated)       Current Barriers:  . Diabetes: T2DM; most recent A1c 7.0% on 02/26/19 (was 7.9% on 8/20, 9.2% on 06/10/2018)  . Current antihyperglycemic regimen: Tresiba 25 units qHS, Ozempic once weekly sq on Fridays. One touch ultra glucometer o Ozempic/Tresiba PAP submitted to [facility]--4 months supply arrived today 01/15/2019; refill request to be sent in April 2021 (medication will ship to office) . Denies hypoglycemic symptoms; denies hyperglycemic symptoms . Current exercise: walking as able . Current blood glucose readings: patient states FBG<120 in the AM (none <70) . Cardiovascular risk reduction: o Current hypertensive regimen: hydralazine TID o Current hyperlipidemia regimen: pravastatin 32m daily (last filled #90 in 01/2019); LDL increased to 102 on 02/26/19 o Watch Scr and insulin/need for medication adjustments.  Scr last 3.02 on 02/26/19 (decreased from 3.17 On 08/29/18)  Pharmacist Clinical Goal(s):  .Marland KitchenOver the next 90 days, patient with work with PharmD and primary care provider to address optimization  of medication management of chronic conditions.  Interventions: Call completed on 02/26/19 . Comprehensive medication review performed, medication list updated in electronic medical record.   . Reviewed & discussed the following diabetes-related information with patient: o Follow ADA recommended "diabetes-friendly" diet  (reviewed healthy snack/food options) o Discussed insulin/GLP-1 injection technique o Reviewed medication purpose/side effects-->patient denies adverse events  Patient Self Care Activities:  . Patient will check blood glucose daily , document, and provide at future appointments . Patient will focus on medication adherence by taking insulin and GLP-1 as prescribed. . Patient will take medications as prescribed . Patient will contact provider with any episodes of hypoglycemia . Patient will report any questions or concerns to provider   Please see past updates related to this goal by clicking on the "Past Updates" button in the selected goal     .  My gabapentin is making me itch.  I would like to try a different medication for my chronic neuropathy. (pt-stated)   Not on track    Current Barriers:  . Patient reports "itching" with gabapentin therapy.  He is currently taking gabapentin 242m qHS.  Pharmacist Clinical Goal(s):  .Marland KitchenOver the next 45 days, patient will work with PharmD and provider towards optimized medication management Goal Met . New - 12/12/18 Over the next 90 days, patient will have completed his MD f/u with Dr. PMirna Miresregarding treatment recommendations for his Diabetic Neuropathy . New - 12/12/18 Over the next 90 days, patient will report improved daily glycemic control as evidence by patient will reports CBG's are consistently ranging from 80-130 Goal Met  . New - 06/03/19 Over the next 30 days, patient will verbalize understanding of his recommended treatment plan provided by the Pain Management clinic for treatment of his Diabetic Neuropathy  CCM PharmD  Interventions: 02/11/19: Call completed with patient and wife, SSukhdeep Wieting. Comprehensive medication review performed; medication list updated in electronic medical record . Discussed with patient and MD to work to find an alternative for patient's peripheral neuropathy.  Would avoid TCAs (amitriptyline) given patient's age and potential side effects.  Could consider increase dose if this is a symptom of uncontrolled neuropathy.  Patient states he has not "gotten relief" from taking gabapentin.  Could consider topical agent (lidocaine/capsacin) or low dose Lyrica capsule as therapeutic alternatives.  . Lyrica 270mdaily (dose based on renal function) . Patient reports Lyrica is too expensive.  Lyrica has been delivered to patient's home.  Patient is requesting to see neurologist, therefore will hold on reapply or adjusting Lyrica. o Patient states Lyrica has helped "only a Azalyn Sliwa bit", but he continues to have some burning/tingling in his toes. o He is being referred to Dr. PlAreatha Keaslaced and number given to patient wife to call/set up appt on 02/11/19 . [name]  CCM RN CM Interventions: 06/03/19 completed call with patient  . Inter-disciplinary care team collaboration (see longitudinal plan of care) . Evaluation of current treatment plan related to Diabetic Neuropathy and patient's adherence to plan as established by provider. . Reviewed medications with patient and discussed patient is taking Lyrica for management of symptoms of Diabetic Neuropathy with minimal effectiveness from this therapy, Dr. SaBaird Cancers aware; Pain referral placed with [facility] . Discussed plans with patient for ongoing care management follow up and provided patient with direct contact information for care management team  Patient Self Care Activities:  . Patient will take medications as prescribed . Patient open to an alternative for his peripheral  neuropathy  Please see past updates related to this goal by clicking on the "Past Updates" button in the selected goal         Plan:   Telephone follow up appointment with care management team member scheduled for: 07/04/19  AnBarb MerinoRN, BSN, CCM Care Management Coordinator THEmpireanagement/[facility]  Direct Phone: [phone]

## 2019-06-12 NOTE — Patient Instructions (Signed)
Visit Information  Goals Addressed      Patient Stated   . COMPLETED: "I can't afford my diabetic medicines" (pt-stated)       Current Barriers:  [name] Knowledge Deficits related to resources to assist with cost of medications to help manage chronic conditions such as diabetes and neuropathy  CCM PharmD Clinical Goal(s):  [name] Over the next 30 days, patient will work with CCM team to address needs related to cost of Ozempic and [place]. Goal Met   Interventions:   Completed CCM PharmD telephone outreach with patient on 02/12/19  Evaluation of current treatment plan related to pharmacological treatment for Diabetes and patient's adherence to plan as established by provider . Reviewed medications with patient-->updated medication list per patient report & dispense history . Discussed plans with patient for ongoing care management follow up and provided patient with direct contact information for care management team . Collaboration with [facility] CPhT, Etter Sjogren for patient assistance programs for [employer] through [employer] (provides free pen needles as well).  Patient qualifies based on initial screening.  Medications will be delivered to PCP office once approved & shipped. . 02/26/19--patient approved for [employer] (ozempic and tresiba) patient picked up 81-monthsupply.  Refill request will need to be sent in April 2021  Patient Self Care Activities:   VRosezena Sensorunderstanding of the education/information provided today . Self administers medications as prescribed . Attends all scheduled provider appointments . Calls pharmacy for medication refills . Performs ADL's independently . Performs IADL's independently . Calls provider office for new concerns or questions  Please see past updates related to this goal by clicking on the "Past Updates" button in the selected goal       . "I know my kidney function is not good" (pt-stated)       Current Barriers:  .Marland KitchenKnowledge Deficits  related to disease process and treatment mangement for renal insufficiency/CKD . Chronic Disease Management support and education needs related to DM, CKD Stage 4, Hypertensive Nephropathy  Nurse Case Manager Clinical Goal(s):  . New 06/03/19 Over the next 90 days, patient will verbalize increased understanding and knowledge about what foods to eat with having stage IV CKD and type II Diabetes Mellitus  CCM RN CM Interventions:  06/03/19: Completed call with patient . Evaluation of current treatment plan related to stage IV CKD and patient's adherence to plan as established by provider . Provided education to patient re: recent CMP results that show persistent decreased renal function . Discussed and reviewed disease progression prevention and how to maintain adequate renal function  . Mailed printed educational materials to patient for review at next call, related to; "6 Ways to be Water Wise", "Diabetes and Kidney Disease: What to Eat?", "The Best Salt Substitute for Kidney Patients" . Discussed plans with patient for ongoing care management follow up and provided patient with direct contact information for care management team  Patient Self Care Activities:  . Self administers medications as prescribed . Attends all scheduled provider appointments . Calls pharmacy for medication refills . Attends church or other social activities . Performs ADL's independently . Performs IADL's independently . Calls provider office for new concerns or questions  Please see past updates related to this goal by clicking on the "Past Updates" button in the selected goal      . "I need to get my A1C under 7" (pt-stated)   On track    Current Barriers:  .Marland KitchenKnowledge Deficits related to disease process  and Self Health management of Diabetes . Chronic Disease Management support and education needs related to DM, CKD Stage 4, Hypertensive Nephropathy  Nurse Case Manager Clinical Goal(s):  [name] New 06/03/19 Over  the next 90 days, patient will verbalize plan for modifying factors to prevent or minimize shifts in glucose levels . New 06/03/19 Over the next 90 days, patient will maintain and or lower his A1c = < 7.0 %  CCM RN CM Interventions:  06/03/19 completed call with patient  . Evaluation of adherence to current treatment plan related to Diabetes and patient's adherence to plan as established by provider . Discussed and reviewed patient's current daily glycemic control, patient reports his FBS is within target 80-130; Reviewed and discussed current A1c is at 7.0 % . Reviewed medications with patient, he reports adherence with taking Tresiba 200 units/ml, inject 25 units q hs, Semaglutide inject 0.5 mg once a week . Reinforced importance of adhering to a diabetic friendly diet, implementing exercise into his daily routine and taking his medications exactly as prescribed w/o missing doses  . Mailed printed educational materials related to Diabetes Care Schedule to patient's home for review at next f/u call  . Discussed plans with patient for ongoing care management follow up and provided patient with direct contact information for care management team  Patient Self Care Activities:  . Self administers medications as prescribed . Attends all scheduled provider appointments . Calls pharmacy for medication refills . Performs ADL's independently . Performs IADL's independently . Calls provider office for new concerns or questions  Please see past updates related to this goal by clicking on the "Past Updates" button in the selected goal       . COMPLETED: I would like to continue managing my diabetes and get my A1c <7 (pt-stated)       Current Barriers:  . Diabetes: T2DM; most recent A1c 7.0% on 02/26/19 (was 7.9% on 8/20, 9.2% on 06/10/2018)  . Current antihyperglycemic regimen: Tresiba 25 units qHS, Ozempic once weekly sq on Fridays. One touch ultra glucometer o Ozempic/Tresiba PAP submitted to [facility]--4 months supply arrived today 01/15/2019; refill request to be sent in April 2021 (medication will ship to office) . Denies hypoglycemic symptoms; denies hyperglycemic symptoms . Current exercise: walking as able . Current blood glucose readings: patient states FBG<120 in the AM (none <70) . Cardiovascular risk reduction: o Current hypertensive regimen: hydralazine TID o Current hyperlipidemia regimen: pravastatin 50m daily (last filled #90 in 01/2019); LDL increased to 102 on 02/26/19 o Watch Scr and insulin/need for medication adjustments.  Scr last 3.02 on 02/26/19 (decreased from 3.17 On 08/29/18)  Pharmacist Clinical Goal(s):  .Marland KitchenOver the next 90 days, patient with work with PharmD and primary care provider to address optimization of medication management of chronic conditions.  Interventions: Call completed on 02/26/19 . Comprehensive medication review performed, medication list updated in electronic medical record.   . Reviewed & discussed the following diabetes-related information with patient: o Follow ADA recommended "diabetes-friendly" diet  (reviewed healthy snack/food options) o Discussed insulin/GLP-1 injection technique o Reviewed medication purpose/side effects-->patient denies adverse events  Patient Self Care Activities:  . Patient will check blood glucose daily , document, and provide at future appointments . Patient will focus on medication adherence by taking insulin and GLP-1 as prescribed. . Patient will take medications as prescribed . Patient will contact provider with any episodes of hypoglycemia . Patient will report any questions or concerns to provider   Please see past  updates related to this goal by clicking on the "Past Updates" button in the selected goal       . My gabapentin is making me itch.  I would like to try a different medication for my chronic neuropathy. (pt-stated)   Not on track    Current Barriers:  . Patient reports "itching" with  gabapentin therapy.  He is currently taking gabapentin 235m qHS.  Pharmacist Clinical Goal(s):  .Marland KitchenOver the next 45 days, patient will work with PharmD and provider towards optimized medication management Goal Met . New - 12/12/18 Over the next 90 days, patient will have completed his MD f/u with Dr. PMirna Miresregarding treatment recommendations for his Diabetic Neuropathy . New - 12/12/18 Over the next 90 days, patient will report improved daily glycemic control as evidence by patient will reports CBG's are consistently ranging from 80-130 Goal Met  . New - 06/03/19 Over the next 30 days, patient will verbalize understanding of his recommended treatment plan provided by the Pain Management clinic for treatment of his Diabetic Neuropathy  CCM PharmD Interventions: 02/11/19: Call completed with patient and wife, SCamdon Saetern. Comprehensive medication review performed; medication list updated in electronic medical record . Discussed with patient and MD to work to find an alternative for patient's peripheral neuropathy.  Would avoid TCAs (amitriptyline) given patient's age and potential side effects.  Could consider increase dose if this is a symptom of uncontrolled neuropathy.  Patient states he has not "gotten relief" from taking gabapentin.  Could consider topical agent (lidocaine/capsacin) or low dose Lyrica capsule as therapeutic alternatives.  . Lyrica 260mdaily (dose based on renal function) . Patient reports Lyrica is too expensive.  Lyrica has been delivered to patient's home.  Patient is requesting to see neurologist, therefore will hold on reapply or adjusting Lyrica. o Patient states Lyrica has helped "only a Braedan Meuth bit", but he continues to have some burning/tingling in his toes. o He is being referred to Dr. PlAreatha Keaslaced and number given to patient wife to call/set up appt on 02/11/19 . [name]  CCM RN CM Interventions: 06/03/19 completed call with patient   . Inter-disciplinary care team collaboration (see longitudinal plan of care) . Evaluation of current treatment plan related to Diabetic Neuropathy and patient's adherence to plan as established by provider. . Reviewed medications with patient and discussed patient is taking Lyrica for management of symptoms of Diabetic Neuropathy with minimal effectiveness from this therapy, Dr. SaBaird Cancers aware; Pain referral [facility] with [facility] . Discussed plans with patient for ongoing care management follow up and provided patient with direct contact information for care management team  Patient Self Care Activities:  . Patient will take medications as prescribed . Patient open to an alternative for his peripheral neuropathy  Please see past updates related to this goal by clicking on the "Past Updates" button in the selected goal         Patient verbalizes understanding of instructions provided today.   Telephone follow up appointment with care management team member scheduled for: 07/04/19  AnBarb MerinoRN, BSN, CCM Care Management Coordinator [facility]  Direct Phone: [phone]

## 2019-06-14 ENCOUNTER — Telehealth: Payer: Self-pay

## 2019-06-14 NOTE — Telephone Encounter (Signed)
-----   Message from [name], MD sent at 06/12/2019 10:00 PM EDT ----- Your muscle enzymes are normal. Your hba1c is 6.7, this has improved. Keep up the great work! I suggest we cut back on the Tresiba- cut back to 23 units nightly.

## 2019-06-14 NOTE — Telephone Encounter (Signed)
Left vm for pt to return call for lab results  

## 2019-06-18 ENCOUNTER — Telehealth: Payer: Self-pay

## 2019-06-18 NOTE — Telephone Encounter (Signed)
-----   Message from [name], MD sent at 06/12/2019 10:00 PM EDT ----- Your muscle enzymes are normal. Your hba1c is 6.7, this has improved. Keep up the great work! I suggest we cut back on the Tresiba- cut back to 23 units nightly.

## 2019-06-25 DIAGNOSIS — H401133 Primary open-angle glaucoma, bilateral, severe stage: Secondary | ICD-10-CM | POA: Diagnosis not present

## 2019-06-26 ENCOUNTER — Other Ambulatory Visit: Payer: Self-pay | Admitting: Internal Medicine

## 2019-06-29 ENCOUNTER — Other Ambulatory Visit: Payer: Self-pay | Admitting: Nurse Practitioner

## 2019-06-30 ENCOUNTER — Telehealth: Payer: Self-pay

## 2019-06-30 NOTE — Telephone Encounter (Signed)
The pt was given his most recent lab results.

## 2019-07-04 ENCOUNTER — Telehealth: Payer: Self-pay

## 2019-07-04 DIAGNOSIS — R8279 Other abnormal findings on microbiological examination of urine: Secondary | ICD-10-CM | POA: Diagnosis not present

## 2019-07-04 DIAGNOSIS — N3000 Acute cystitis without hematuria: Secondary | ICD-10-CM | POA: Diagnosis not present

## 2019-07-04 NOTE — Chronic Care Management (AMB) (Deleted)
Chronic Care Management Pharmacy  Name: Justin Holloway  MRN: [id number] DOB: Oct 16, 1929  Chief Complaint/ HPI  Justin Holloway,  86 y.o. , male presents for their Initial CCM visit with the clinical pharmacist {CHL HP Upstream Pharm visit DGUY:[id number]}.  PCP : Justin Chard, MD  Their chronic conditions include: HTN, Peripheral Vascular disease, DM, Diabetic neuropathy, CKD stage 4, HLD, Hyperkalemia, Vertigo, impotence.   Office Visits:*** 06/18/2019 - Dr. Baird Holloway reduced Justin Holloway to 23 units qpm since a1c is 6.7%.  05/28/2019 - Justin Holloway - discussed shingles vaccine. Recommend annual visit for glaucoma screening.  05/28/2019 - Chronic visit for DM and BP. Encouraged to avoid salting foods. EKG for chest pain. NSR w/o acute changes. Recommended 11-20 lb weight loss. Reminded to do chair exercises.  04/28/2019 - UTI - rocephin injection in office. Second UTI in 1 month. Sending urine for culture. Pt did not stop by lab for PSA.  03/06/2019 - [place] vaccine #2 in office.  02/28/2019 - Lab results telephone call. Cholesterol above goal - is patient taking cholesterol med? A1c 7%. Stable Kidney Function.  02/26/2019 - referral to pain clinic for neuropathy since no relief from gabapentin or Lyrica. Abnormal urine/frequent urination - send off culture.  02/01/2019 - [place] Covid vaccine #1.   Consult Visit:*** 05/23/2019 - ED f/u call - urine culture positive for UTI. If symptoms improving with antibiotic continue as prescribed.  05/21/2019 - ED visit -acute cystitis and ileus due to constipation on x-ray. Prescribed Keflex 500 mg qid. 05/21/2019 - Urgent Care for abdominal pain/back pain - x-ray showed ileus. Referred to ED.  02/18/2019 - Podiatry - debridement of mycotic and hypertrophic toenails. F/U 3 months.  01/21/2019 - Opthamology - eye exam. CCM Visits: *** 06/02/2019 - RN CCM - referral to pain management for neuropathy. Work to modify factors to minimize shifts in  glucose level.  05/14/2019 - Scheduled with Pharmacist.  02/26/2019 - Pharmacist - patient approved for novo nordisk PAP and picked up 4 month supply of Justin Holloway and Justin Holloway.   02/12/2019 - Pharmacist - coordinated with CPhT for patient assistance. 01/22/2019 - CCM RN - checking on referral to Dr. Mirna Holloway. Nurse contacted Dr. Si Holloway office to follow-up and provide clinical documentation.  01/22/2019 - Patient approved for Novo nordisk Justin Holloway and Justin Holloway) patient assistance and Lyrica.  01/15/2019 - Pharmacist call - Diabetes related education diabetes-friendly diet, discussed injection glp-1 technique, reviewed medication purpose/side effects.  01/08/2019 - CPhT - Received provider portion(s) of patient assistance application(s) for Lyrica. Faxed completed application and required documents into [employer]. 01/06/2019 - CPhT - Received patient portion(s) of patient assistance application(s) for Justin Holloway, Justin Holloway and Lyrica. Will fax completed application and required documents into Justin Holloway for Justin Holloway and Justin Holloway on 01/13/19. Medications: Outpatient Encounter Medications as of 07/08/2019  Medication Sig Note  . Ascorbic Acid (VITAMIN C) 1000 MG tablet Take 1,000 mg by mouth daily.   [name] aspirin EC 81 MG tablet Take 81 mg by mouth every evening.   . Blood Glucose Monitoring Suppl (ONE TOUCH ULTRA 2) w/Device KIT Use as directed to check blood sugars 2 times per day dx: e11.22   . cholecalciferol (VITAMIN D) 1000 units tablet Take 2,000 Units by mouth 2 (two) times daily.    . ferrous sulfate 325 (65 FE) MG tablet TAKE 1 TABLET BY MOUTH DAILY   . furosemide (LASIX) 80 MG tablet TAKE 1 TABLET BY MOUTH IN  THE MORNING AND ONE-HALF  TABLET BY MOUTH IN THE  EARLY  EVENING   . glucose blood (ONETOUCH ULTRA) test strip USE STRIPS TO TEST BLOOD  SUGAR 3 TIMES DAILY 1 STRIP PER TEST   . hydrALAZINE (APRESOLINE) 25 MG tablet TAKE 1 TABLET BY MOUTH 3  TIMES DAILY   . Insulin Degludec (Justin Holloway FLEXTOUCH) 200  UNIT/ML SOPN Inject 23 Units into the skin at bedtime.    [name] lactulose (CEPHULAC) 10 g packet Take 1 packet (10 g total) by mouth 3 (three) times daily.   . Lancets (ONETOUCH DELICA PLUS OVFIEP32R) MISC 100 each by Does not apply route in the morning, at noon, and at bedtime.   [name] LUMIGAN 0.01 % SOLN Place 1 drop into both eyes 2 (two) times daily.    . meclizine (ANTIVERT) 12.5 MG tablet TAKE 1 TABLET BY MOUTH THREE TIMES DAILY AS NEEDED FOR DIZZINESS   . Multiple Vitamin (MULTIVITAMIN WITH MINERALS) TABS tablet Take 1 tablet by mouth daily.   . pravastatin (PRAVACHOL) 40 MG tablet TAKE 1 TABLET BY MOUTH DAILY   . pregabalin (LYRICA) 25 MG capsule Take 25 mg by mouth daily. 11/25/2020Frances Holloway patient assistance  . Semaglutide,0.25 or 0.5MG/DOS, (Justin Holloway, 0.25 OR 0.5 MG/DOSE,) 2 MG/1.5ML SOPN Inject 0.5 mg into the skin once a week.    No facility-administered encounter medications on file as of 07/08/2019.   Allergies  Allergen Reactions  . Gabapentin Itching   SDOH Screenings   Alcohol Screen:   . Last Alcohol Screening Score (AUDIT):   Depression (PHQ2-9): Low Risk   . PHQ-2 Score: 0  Financial Resource Strain: Low Risk   . Difficulty of Paying Living Expenses: Not hard at all  Food Insecurity: No Food Insecurity  . Worried About Charity fundraiser in the Last Year: Never true  . Ran Out of Food in the Last Year: Never true  Housing:   . Last Housing Risk Score:   Physical Activity: Insufficiently Active  . Days of Exercise per Week: 7 days  . Minutes of Exercise per Session: 20 min  Social Connections:   . Frequency of Communication with Friends and Family:   . Frequency of Social Gatherings with Friends and Family:   . Attends Religious Services:   . Active Member of Clubs or Organizations:   . Attends Archivist Meetings:   [name] Marital Status:   Stress: No Stress Concern Present  . Feeling of Stress : Not at all  Tobacco Use: Medium Risk  . Smoking Tobacco Use:  Former Smoker  . Smokeless Tobacco Use: Never Used  Transportation Needs: No Transportation Needs  . Lack of Transportation (Medical): No  . Lack of Transportation (Non-Medical): No     Current Diagnosis/Assessment:  Goals Addressed   None    Hyperlipidemia   LDL goal < ***  Lipid Panel     Component Value Date/Time   CHOL 173 02/26/2019 1038   TRIG 69 02/26/2019 1038   HDL 58 02/26/2019 1038   LDLCALC 102 (H) 02/26/2019 1038    Hepatic Function Latest Ref Rng & Units 05/21/2019 02/26/2019 10/21/2018  Total Protein 6.5 - 8.1 g/dL 7.6 7.0 8.1  Albumin 3.5 - 5.0 g/dL 4.1 4.1 4.1  AST 15 - 41 U/L 29 24 25   ALT 0 - 44 U/L 20 12 13   Alk Phosphatase 38 - 126 U/L 57 73 62  Total Bilirubin 0.3 - 1.2 mg/dL 0.7 0.3 1.0     The ASCVD Risk score Mikey Bussing DC Jr., et al., 2013) failed to calculate for the  following reasons:   The 2013 ASCVD risk score is only valid for ages 34 to 44   Patient has failed these meds in past: *** Patient is currently {CHL Controlled/Uncontrolled:[phone]} on the following medications:  . Aspirin EC 81 mg daily . Pravastatin 40 mg daily  We discussed:  {CHL HP Upstream Pharmacy discussion:[phone]}  Plan  Continue {CHL HP Upstream Pharmacy Plans:[phone]}   Diabetes   Recent Relevant Labs: Lab Results  Component Value Date/Time   HGBA1C 6.7 (H) 06/10/2019 10:00 AM   HGBA1C 7.0 (H) 02/26/2019 10:38 AM   MICROALBUR 80 05/28/2019 12:29 PM   MICROALBUR 80 12/26/2017 04:58 PM     Checking BG: {CHL HP Blood Glucose Monitoring Frequency:[phone]}  Recent FBG Readings: Recent pre-meal BG readings: *** Recent 2hr PP BG readings:  *** Recent HS BG readings: *** Patient has failed these meds in past: Tanzeum, glimepiride, toujeo, metformin, pioglitazone Patient is currently {CHL Controlled/Uncontrolled:[phone]} on the following medications: Justin Holloway flextouch 23 units into skin at bedtime, Justin Holloway 0.5 mg once week, One Touch ultra test  strips,  One Touch Delica plus lancest  Last diabetic Foot exam: No results found for: HMDIABEYEEXA  Last diabetic Eye exam: No results found for: HMDIABFOOTEX   We discussed: {CHL HP Upstream Pharmacy discussion:[phone]}  Plan  Continue {CHL HP Upstream Pharmacy Plans:[phone]},  Hypertension   BP today is:  {CHL HP UPSTREAM Pharmacist BP [phone]}  Office blood pressures are  BP Readings from Last 3 Encounters:  05/28/19 118/64  05/28/19 118/64  05/21/19 132/69   Kidney Function Lab Results  Component Value Date/Time   CREATININE 3.16 (H) 05/21/2019 02:00 PM   CREATININE 3.02 (H) 02/26/2019 10:38 AM   GFRNONAA 17 (L) 05/21/2019 02:00 PM   GFRAA 19 (L) 05/21/2019 02:00 PM   K 4.5 05/21/2019 02:00 PM   K 5.0 02/26/2019 10:38 AM     Patient has failed these meds in the past: amlodipine, edarbi, losartan, losartan-hctz,  Patient is currently controlled/uncontrolled*** on the following medications: Furosemide 80 mg qam and 40 mg early evening, Hydralazine 25 mg tid Patient checks BP at home {CHL HP BP Monitoring Frequency:[phone]}  Patient home BP readings are ranging: ***  We discussed {CHL HP Upstream Pharmacy discussion:[phone]}  Plan  Continue {CHL HP Upstream Pharmacy Plans:[phone]}     and  Other Diagnosis:Neuropathy    Patient has failed these meds in past: gabapentin  Patient is currently {CHL Controlled/Uncontrolled:[phone]} on the following medications: pregabalin 25 mg daily  We discussed:  {CHL HP Upstream Pharmacy discussion:[phone]}  Plan  Continue {CHL HP Upstream Pharmacy Plans:[phone]}   ***Constipation   Patient has failed these meds in past: miralax, senna-docusate, linzess Patient is currently {CHL Controlled/Uncontrolled:[phone]} on the following medications: Lactulose 10 gm packet tid  We discussed:  ***  Plan  Continue {CHL HP Upstream Pharmacy WEXHB:[id number]}   Health Maintenance   Patient  is currently {CHL Controlled/Uncontrolled:[phone]} on the following medications:  [name] Vitamin c 1000 mg daily - *** . Ferrous sulfate 325 mg - *** . Meclizine 12.5 mg tid prn dizziness  . Vitamin D 2000 units daily -*** . Multivitamin with minerals daily - *** . Lumigan 0.01% 1 drop into both eyes bid - ***  We discussed:  ***  Plan  Continue {CHL HP Upstream Pharmacy BOFBP:[id number]}  Vaccines   Reviewed and discussed patient's vaccination history.    Immunization History  Administered Date(s) Administered  . Influenza, High Dose Seasonal PF 11/21/2017, 08/29/2018  . PFIZER SARS-COV-2 Vaccination 02/01/2019, 03/06/2019  .  Pneumococcal Polysaccharide-23 12/10/1998    Plan  Recommended patient receive *** vaccine in *** office.   Medication Management   [facility] for all medications Uses pill box? {Yes or If no, why not?:20788} Pt endorses ***% compliance  We discussed: ***  Plan  {US Pharmacy LKZG:94834}    Follow up: *** month phone visit  ***

## 2019-07-08 ENCOUNTER — Ambulatory Visit: Payer: Medicare Other

## 2019-07-08 ENCOUNTER — Other Ambulatory Visit: Payer: Self-pay

## 2019-07-08 DIAGNOSIS — E1141 Type 2 diabetes mellitus with diabetic mononeuropathy: Secondary | ICD-10-CM

## 2019-07-08 DIAGNOSIS — Z794 Long term (current) use of insulin: Secondary | ICD-10-CM

## 2019-07-08 DIAGNOSIS — N184 Chronic kidney disease, stage 4 (severe): Secondary | ICD-10-CM

## 2019-07-08 NOTE — Chronic Care Management (AMB) (Signed)
Chronic Care Management Pharmacy  Name: Justin Holloway  MRN: [id number] DOB: 1929-10-14  Chief Complaint/ HPI  Justin Holloway,  86 y.o. , male presents for their Initial CCM visit with the clinical pharmacist via telephone due to COVID-19 Pandemic.  PCP : [name], MD  Their chronic conditions include: Hypertension, Peripheral Vascular disease, Diabetes mellitus, Diabetic neuropathy, CKD stage 4, Hyperlipidemia, Vertigo  Office Visits: 06/18/2019 Telephone call: Justin Holloway reduced to 23 units nightly since HgbA1c is 6.7%.   05/28/2019 AWV and OV:  Presented for DM and HTN follow up. Encouraged to avoid salting foods. EKG for chest pain. NSR w/o acute changes. Recommended 11-20 lb weight loss. Reminded to do chair exercises. Shingrix vaccine discussed. Recommend annual visit for glaucoma screening.   04/28/2019 - UTI - rocephin injection in office. Second UTI in 1 month. Sending urine for culture. Pt did not stop by lab for PSA.   02/28/2019 - Lab results telephone call. Cholesterol above goal - is patient taking cholesterol med? A1c 7%. Stable Kidney Function.   02/26/2019 OV: Presented for Dm and HTN follow up. Referral to pain clinic for neuropathy since no relief from gabapentin or Lyrica. Complained or urinary frequency and wants to stop fluid pills. Labs ordered (CMP14+EGFR, HgbA1c, lipid panel, UA and culture).   Consult Visits: 05/23/2019 - ED f/u call - urine culture positive for UTI. If symptoms improving with antibiotic continue as prescribed.   05/21/2019 - ED visit -acute cystitis and ileus due to constipation on x-ray. Prescribed Keflex 500 mg qid.  05/21/2019 - Urgent Care for abdominal pain/back pain - x-ray showed ileus. Referred to ED.   03/25/19 Pain management OV w/ P. Campbell: Opioid not recommended. Pt can discontinue Lyrica if ineffective. Recommended capsaicin cream.   02/18/2019 - Podiatry - debridement of mycotic and hypertrophic toenails. F/U 3  months.   01/21/2019 - Opthalmology - eye exam.  CCM Visits: 06/02/2019 - RN CCM - referral to pain management for neuropathy. Work to modify factors to minimize shifts in glucose level.   02/26/2019 - Pharmacist - patient approved for novo nordisk PAP and picked up 4 month supply of Ozempic and [place].    02/12/2019 - Pharmacist - coordinated with CPhT for patient assistance.  01/22/2019 - CCM RN - checking on referral to [name]. Nurse contacted Dr. Si Gaul office to follow-up and provide clinical documentation.   01/22/2019 - Patient approved for Novo nordisk Justin Holloway and Ozempic) patient assistance and Lyrica.   01/15/2019 - Pharmacist call - Diabetes related education diabetes-friendly diet, discussed injection glp-1 technique, reviewed medication purpose/side effects.   01/08/2019 - CPhT - Received provider portion(s) of patient assistance application(s) for Lyrica. Faxed completed application and required documents into [employer].  01/06/2019 - CPhT - Received patient portion(s) of patient assistance application(s) for Ozempic, Tresiba and Lyrica. Will fax completed application and required documents into [employer] for [place] and Ozempic on 01/13/19.  Medications: Outpatient Encounter Medications as of 07/08/2019  Medication Sig Note  . aspirin EC 81 MG tablet Take 81 mg by mouth every evening.   . Blood Glucose Monitoring Suppl (ONE TOUCH ULTRA 2) w/Device KIT Use as directed to check blood sugars 2 times per day dx: e11.22   . ferrous sulfate 325 (65 FE) MG tablet TAKE 1 TABLET BY MOUTH DAILY   . furosemide (LASIX) 80 MG tablet TAKE 1 TABLET BY MOUTH IN  THE MORNING AND ONE-HALF  TABLET BY MOUTH IN THE  EARLY EVENING   . glucose  blood (ONETOUCH ULTRA) test strip USE STRIPS TO TEST BLOOD  SUGAR 3 TIMES DAILY 1 STRIP PER TEST   . hydrALAZINE (APRESOLINE) 25 MG tablet TAKE 1 TABLET BY MOUTH 3  TIMES DAILY   . Insulin Degludec (TRESIBA FLEXTOUCH) 200 UNIT/ML SOPN Inject 23  Units into the skin at bedtime.    . Lancets (ONETOUCH DELICA PLUS [id number]) MISC 100 each by Does not apply route in the morning, at noon, and at bedtime.   [name] LUMIGAN 0.01 % SOLN Place 1 drop into both eyes 2 (two) times daily.    . Multiple Vitamin (MULTIVITAMIN WITH MINERALS) TABS tablet Take 1 tablet by mouth daily.   . polyethylene glycol powder (GLYCOLAX/MIRALAX) 17 GM/SCOOP powder Take 17 g by mouth daily.   . pravastatin (PRAVACHOL) 40 MG tablet TAKE 1 TABLET BY MOUTH DAILY   . Semaglutide,0.25 or 0.5MG/DOS, (OZEMPIC, 0.25 OR 0.5 MG/DOSE,) 2 MG/1.5ML SOPN Inject 0.5 mg into the skin once a week.   . Ascorbic Acid (VITAMIN C) 1000 MG tablet Take 1,000 mg by mouth daily. (Patient not taking: Reported on 07/09/2019)   . cholecalciferol (VITAMIN D) 1000 units tablet Take 2,000 Units by mouth 2 (two) times daily.  (Patient not taking: Reported on 07/09/2019)   . lactulose (CEPHULAC) 10 g packet Take 1 packet (10 g total) by mouth 3 (three) times daily. (Patient not taking: Reported on 07/09/2019)   . meclizine (ANTIVERT) 12.5 MG tablet TAKE 1 TABLET BY MOUTH THREE TIMES DAILY AS NEEDED FOR DIZZINESS (Patient not taking: Reported on 07/09/2019)   . pregabalin (LYRICA) 25 MG capsule Take 25 mg by mouth daily. (Patient not taking: Reported on 07/09/2019) 12/04/2018: Receives patient assistance   No facility-administered encounter medications on file as of 07/08/2019.   Allergies  Allergen Reactions  . Gabapentin Itching   SDOH Screenings   Alcohol Screen:   . Last Alcohol Screening Score (AUDIT):   Depression (PHQ2-9): Low Risk   . PHQ-2 Score: 0  Financial Resource Strain: Medium Risk  . Difficulty of Paying Living Expenses: Somewhat hard  Food Insecurity: No Food Insecurity  . Worried About Charity fundraiser in the Last Year: Never true  . Ran Out of Food in the Last Year: Never true  Housing:   . Last Housing Risk Score:   Physical Activity: Insufficiently Active  . Days of Exercise  per Week: 7 days  . Minutes of Exercise per Session: 20 min  Social Connections:   . Frequency of Communication with Friends and Family:   . Frequency of Social Gatherings with Friends and Family:   . Attends Religious Services:   . Active Member of Clubs or Organizations:   . Attends Archivist Meetings:   [name] Marital Status:   Stress: No Stress Concern Present  . Feeling of Stress : Not at all  Tobacco Use: Medium Risk  . Smoking Tobacco Use: Former Smoker  . Smokeless Tobacco Use: Never Used  Transportation Needs: No Transportation Needs  . Lack of Transportation (Medical): No  . Lack of Transportation (Non-Medical): No    Current Diagnosis/Assessment:  SDOH Interventions     Most Recent Value  SDOH Interventions  Financial Strain Interventions --  [Will continue to assist with patient assistance programs and obtaining medications]       Goals Addressed            This Visit's Progress   . Pharmacy Care Plan       CARE PLAN ENTRY (see  longitudinal plan of care for additional care plan information)  Current Barriers:  . Chronic Disease Management support, education, and care coordination needs related to Hypertension, Hyperlipidemia, and Diabetes   Hypertension BP Readings from Last 3 Encounters:  05/28/19 118/64  05/28/19 118/64  05/21/19 132/69   . Pharmacist Clinical Goal(s): o Over the next 90 days, patient will work with PharmD and providers to maintain BP goal <130/80 . Current regimen:  o Hydralazine 48m three times daily o Furosemide 834mevery morning and 4094mvery evening . Interventions: o Dietary and exercise recommendations provided . Patient self care activities - Over the next 90 days, patient will: o Check BP if symptomatic, document, and provide at future appointments o Ensure daily salt intake < 2300 mg/day  Hyperlipidemia Lab Results  Component Value Date/Time   LDLCALC 102 (H) 02/26/2019 10:38 AM   . Pharmacist Clinical  Goal(s): o Over the next 90 days, patient will work with PharmD and providers to achieve LDL goal < 70 . Current regimen:  o Pravastatin 6m78mily . Interventions: o Discuss with PCP, changing to atorvastatin 6mg37mly due to LDL above goal and kidney function . Patient self care activities - Over the next 90 days, patient will: o Start atorvastatin 6mg 34my, if PCP agrees  Diabetes Lab Results  Component Value Date/Time   HGBA1C 6.7 (H) 06/10/2019 10:00 AM   HGBA1C 7.0 (H) 02/26/2019 10:38 AM   . Pharmacist Clinical Goal(s): o Over the next 90 days, patient will work with PharmD and providers to maintain A1c goal <7% . Current regimen:  o Tresiba 23 units nightly o Ozempic 0.5mg we57my . Interventions: o Will discuss increasing Lyrica to 50mg da58mwith PCP for neuropathy . Patient self care activities - Over the next 90 days, patient will: o Check blood sugar once daily, document, and provide at future appointments o Contact provider with any episodes of hypoglycemia o Increase Lyrica to 50mg [id number] PCP agrees  o [facility] [place] [facility] transportation and community senior programs   Medication management . Pharmacist Clinical Goal(s): o Over the next 90 days, patient will work with PharmD and providers to maintain optimal medication adherence . [facility] . Interventions o Comprehensive medication review performed. o Continue current medication management strategy . Patient self care activities - Over the next 90 days, patient will: o Focus on medication adherence by continued use of pill box o Take medications as prescribed o Report any questions or concerns to PharmD and/or provider(s)  Initial goal documentation       Hyperlipidemia   LDL goal < 70  Lipid Panel     Component Value Date/Time   CHOL 173 02/26/2019 1038   TRIG 69 02/26/2019 1038   HDL 58 02/26/2019 1038    LDLCALC 102 (H) 02/26/2019 1038    Hepatic Function Latest Ref Rng & Units 05/21/2019 02/26/2019 10/21/2018  Total Protein 6.5 - 8.1 g/dL 7.6 7.0 8.1  Albumin 3.5 - 5.0 g/dL 4.1 4.1 4.1  AST 15 - 41 U/L 29 24 25   ALT 0 - 44 U/L 20 12 13   Alk Phosphatase 38 - 126 U/L 57 73 62  Total Bilirubin 0.3 - 1.2 mg/dL 0.7 0.3 1.0     The ASCVD Risk score ([place]off [place] JEldon, 2013) failed to calculate for the following reasons:   The 2013 ASCVD risk score is only valid for ages 40 to [id number]  Patient has failed these meds in past: N/A Patient is currently uncontrolled on the following medications:  . Pravastatin 40 mg daily  Aspirin EC 81 mg daily  We discussed:   Diet and exercise extensively  Pt says that he tries to eat right  Oatmeal, coffee, salads  Baked chicken and fish mostly  Snacks on Nabs   Drinks 3-4 bottles of water daily  Exercise extensively  Physical therapy gave him exercises to do  When he is sitting he is fine, but when he stands up he doesn't have good balance, gets dizzy and sometimes almost falls  Recommended using the stationary bike because of patient's balance issues  Plan Discuss w/ PCP changing pravastatin to atorvastatin 55m daily due to GFR of 19 and LDL above goal Recommend lipid panel at next PCP visit  Diabetes   Recent Relevant Labs: Lab Results  Component Value Date/Time   HGBA1C 6.7 (H) 06/10/2019 10:00 AM   HGBA1C 7.0 (H) 02/26/2019 10:38 AM   MICROALBUR 80 05/28/2019 12:29 PM   MICROALBUR 80 12/26/2017 04:58 PM   Kidney Function Lab Results  Component Value Date/Time   CREATININE 3.16 (H) 05/21/2019 02:00 PM   CREATININE 3.02 (H) 02/26/2019 10:38 AM   GFRNONAA 17 (L) 05/21/2019 02:00 PM   GFRAA 19 (L) 05/21/2019 02:00 PM   K 4.5 05/21/2019 02:00 PM   K 5.0 02/26/2019 10:38 AM   Checking BG: Daily  Recent FBG Readings: 112,110,120 Recent pre-meal BG readings:  Recent 2hr PP BG readings:   Recent HS BG readings:  Patient has  failed these meds in past: Tanzeum, glimepiride, Toujeo, metformin, pioglitazone Patient is currently controlled on the following medications:   Tresiba Flextouch 23 units into skin at bedtime  Ozempic 0.5 mg once week  Last diabetic Foot exam: Sees podiatrist Last diabetic Eye exam: Sees ophthalmologist  We discussed:  Diet and exercise extensively  Pt denies drinking sodas and eating sweets  Gets Ozempic and Tresiba for free through patient assistance  Plan Continue current medications    Hypertension   Office blood pressures are  BP Readings from Last 3 Encounters:  05/28/19 118/64  05/28/19 118/64  05/21/19 132/69   Patient has failed these meds in the past: Amlodipine, Edarbi, losartan, losartan-hctz Patient is currently controlled on the following medications:   Furosemide 80 mg every morning and 40 mg early evening  Hydralazine 25 mg three times daily  Patient checks BP at home Never, does not have a monitor at home  We discussed:  Pt says he never has any trouble with BP  Doesn't use any salt on his food, just pepper  Plan Continue current medications   Neuropathy   Patient has failed these meds in past: Gabapentin  Patient is currently controlled on the following medications:   Pregabalin 25 mg daily  We discussed:    [facility] 2 medications that were better than Lyrica for neuropathy. No record from this encounter  Pt states that he does not get any benefit from Lyrica  Pt states that he has not received Lyrica recently from PAP and is out of the medication  Plan Continue current medications  Discuss increasing Lyrica dose with PCP to 577monce daily If pt is going to continue Lyrica, contact PAP regarding refill   Constipation   Patient has failed these meds in past: Senna-docusate, Linzess, Lactulose Patient is currently controlled on the following medications:  Miralax  daily  We discussed:   Pt states this works well for him. He gets OTC and takes as directed on package  Plan Continue current medications  Over the Counter Medications   Patient is currently  on the following medications:  . Ferrous sulfate 325 mg twice daily . Multivitamin daily  We discussed:    Iron prescribed by kidney doctor  Pt stopped taking his Vitamin D supplement, no Vitamin D level on file  Plan Continue current medications  Recommend Vitamin D level at next PCP visit  Vaccines   Reviewed and discussed patient's vaccination history.    Immunization History  Administered Date(s) Administered  . Influenza, High Dose Seasonal PF 11/21/2017, 08/29/2018  . PFIZER SARS-COV-2 Vaccination 02/01/2019, 03/06/2019  . Pneumococcal Polysaccharide-23 12/10/1998   Provided patient education regarding Shingrix   Plan Recommended patient receive Prevnar13 followed by Shingrix vaccines series 1 month later at pharmacy  Medication Management   Pt uses [facility] [place] for all medications Uses pill box? Yes Pt endorses 100% compliance  We discussed:  Importance of taking medication daily as directed  Plan Continue current medication management strategy    Pt asked if there were any programs for seniors where someone would pick him up and take him to play bingo a couple of times a week. In basket message sent to SW, Daneen Schick, regarding transportation and senior programs available in the community.    Follow up: 2 month phone visit  Jannette Fogo, PharmD Clinical Pharmacist [facility] [phone]

## 2019-07-09 NOTE — Patient Instructions (Addendum)
Visit Information  Goals Addressed            This Visit's Progress   . Pharmacy Care Plan       CARE PLAN ENTRY (see longitudinal plan of care for additional care plan information)  Current Barriers:  . Chronic Disease Management support, education, and care coordination needs related to Hypertension, Hyperlipidemia, and Diabetes   Hypertension BP Readings from Last 3 Encounters:  05/28/19 118/64  05/28/19 118/64  05/21/19 132/69   . Pharmacist Clinical Goal(s): o Over the next 90 days, patient will work with PharmD and providers to maintain BP goal <130/80 . Current regimen:  o Hydralazine 25mg  three times daily o Furosemide 80mg  every morning and 40mg  every evening . Interventions: o Dietary and exercise recommendations provided . Patient self care activities - Over the next 90 days, patient will: o Check BP if symptomatic, document, and provide at future appointments o Ensure daily salt intake < 2300 mg/day  Hyperlipidemia Lab Results  Component Value Date/Time   LDLCALC 102 (H) 02/26/2019 10:38 AM   . Pharmacist Clinical Goal(s): o Over the next 90 days, patient will work with PharmD and providers to achieve LDL goal < 70 . Current regimen:  o Pravastatin 40mg  daily . Interventions: o Discuss with PCP, changing to atorvastatin 40mg  daily due to LDL above goal and kidney function . Patient self care activities - Over the next 90 days, patient will: o Start atorvastatin 40mg  daily, if PCP agrees  Diabetes Lab Results  Component Value Date/Time   HGBA1C 6.7 (H) 06/10/2019 10:00 AM   HGBA1C 7.0 (H) 02/26/2019 10:38 AM   . Pharmacist Clinical Goal(s): o Over the next 90 days, patient will work with PharmD and providers to maintain A1c goal <7% . Current regimen:  o Tresiba 23 units nightly o Ozempic 0.5mg  weekly . Interventions: o Will discuss increasing Lyrica to 50mg  daily with PCP for neuropathy . Patient self care activities - Over the next 90 days,  patient will: o Check blood sugar once daily, document, and provide at future appointments o Contact provider with any episodes of hypoglycemia o Increase Lyrica to 50mg  daily if PCP agrees  o Ambulance person via in [employer] with SW, Daneen Schick, regarding transportation and community senior programs   Medication management . Pharmacist Clinical Goal(s): o Over the next 90 days, patient will work with PharmD and providers to maintain optimal medication adherence . Current pharmacy: [facility] . Interventions o Comprehensive medication review performed. o Continue current medication management strategy . Patient self care activities - Over the next 90 days, patient will: o Focus on medication adherence by continued use of pill box o Take medications as prescribed o Report any questions or concerns to PharmD and/or provider(s)  Initial goal documentation        Mr. Aldape was given information about Chronic Care Management services today including:  1. CCM service includes personalized support from designated clinical staff supervised by his physician, including individualized plan of care and coordination with other care providers 2. 24/7 contact phone numbers for assistance for urgent and routine care needs. 3. Standard insurance, coinsurance, copays and deductibles apply for chronic care management only during months in which we provide at least 20 minutes of these services. Most insurances cover these services at 100%, however patients may be responsible for any copay, coinsurance and/or deductible if applicable. This service may help you avoid the need for more expensive face-to-face services. 4. Only one practitioner may  furnish and bill the service in a calendar month. 5. The patient may stop CCM services at any time (effective at the end of the month) by phone call to the office staff.  Patient agreed to services and verbal consent obtained.   The patient  verbalized understanding of instructions provided today and agreed to receive a mailed copy of patient instruction and/or educational materials. Telephone follow up appointment with pharmacy team member scheduled for: 9/8 @ 8:30 AM  Jannette Fogo, PharmD Clinical Pharmacist [facility] [phone]  Zoster Vaccine, Recombinant injection What is this medicine? ZOSTER VACCINE (ZOS ter vak SEEN) is used to prevent shingles in adults 84 years old and over. This vaccine is not used to treat shingles or nerve pain from shingles. This medicine may be used for other purposes; ask your health care provider or pharmacist if you have questions. COMMON BRAND NAME(S): [facility] What should I tell my health care provider before I take this medicine? They need to know if you have any of these conditions:  blood disorders or disease  cancer like leukemia or lymphoma  immune system problems or therapy  an unusual or allergic reaction to vaccines, other medications, foods, dyes, or preservatives  pregnant or trying to get pregnant  breast-feeding How should I use this medicine? This vaccine is for injection in a muscle. It is given by a health care professional. Talk to your pediatrician regarding the use of this medicine in children. This medicine is not approved for use in children. Overdosage: If you think you have taken too much of this medicine contact a poison control center or emergency room at once. NOTE: This medicine is only for you. Do not share this medicine with others. What if I miss a dose? Keep appointments for follow-up (booster) doses as directed. It is important not to miss your dose. Call your doctor or health care professional if you are unable to keep an appointment. What may interact with this medicine?  medicines that suppress your immune system  medicines to treat cancer  steroid medicines like prednisone or cortisone This list may not describe  all possible interactions. Give your health care provider a list of all the medicines, herbs, non-prescription drugs, or dietary supplements you use. Also tell them if you smoke, drink alcohol, or use illegal drugs. Some items may interact with your medicine. What should I watch for while using this medicine? Visit your doctor for regular check ups. This vaccine, like all vaccines, may not fully protect everyone. What side effects may I notice from receiving this medicine? Side effects that you should report to your doctor or health care professional as soon as possible:  allergic reactions like skin rash, itching or hives, swelling of the face, lips, or tongue  breathing problems Side effects that usually do not require medical attention (report these to your doctor or health care professional if they continue or are bothersome):  chills  headache  fever  nausea, vomiting  redness, warmth, pain, swelling or itching at site where injected  tiredness This list may not describe all possible side effects. Call your doctor for medical advice about side effects. You may report side effects to FDA at 1-800-FDA-1088. Where should I keep my medicine? This vaccine is only given in a clinic, pharmacy, doctor's office, or other health care setting and will not be stored at home. NOTE: This sheet is a summary. It may not cover all possible information. If you have questions about this medicine,  talk to your doctor, pharmacist, or health care provider.  2020 Elsevier/Gold Standard (2016-08-07 13:20:30) Pneumococcal Conjugate Vaccine suspension for injection What is this medicine? PNEUMOCOCCAL VACCINE (NEU mo KOK al vak SEEN) is a vaccine used to prevent pneumococcus bacterial infections. These bacteria can cause serious infections like pneumonia, meningitis, and blood infections. This vaccine will lower your chance of getting pneumonia. If you do get pneumonia, it can make your symptoms milder and  your illness shorter. This vaccine will not treat an infection and will not cause infection. This vaccine is recommended for infants and young children, adults with certain medical conditions, and adults 47 years or older. This medicine may be used for other purposes; ask your health care provider or pharmacist if you have questions. COMMON BRAND NAME(S): Prevnar, Prevnar 13 What should I tell my health care provider before I take this medicine? They need to know if you have any of these conditions:  bleeding problems  fever  immune system problems  an unusual or allergic reaction to pneumococcal vaccine, diphtheria toxoid, other vaccines, latex, other medicines, foods, dyes, or preservatives  pregnant or trying to get pregnant  breast-feeding How should I use this medicine? This vaccine is for injection into a muscle. It is given by a health care professional. A copy of Vaccine Information Statements will be given before each vaccination. Read this sheet carefully each time. The sheet may change frequently. Talk to your pediatrician regarding the use of this medicine in children. While this drug may be prescribed for children as young as 30 weeks old for selected conditions, precautions do apply. Overdosage: If you think you have taken too much of this medicine contact a poison control center or emergency room at once. NOTE: This medicine is only for you. Do not share this medicine with others. What if I miss a dose? It is important not to miss your dose. Call your doctor or health care professional if you are unable to keep an appointment. What may interact with this medicine?  medicines for cancer chemotherapy  medicines that suppress your immune function  steroid medicines like prednisone or cortisone This list may not describe all possible interactions. Give your health care provider a list of all the medicines, herbs, non-prescription drugs, or dietary supplements you use. Also  tell them if you smoke, drink alcohol, or use illegal drugs. Some items may interact with your medicine. What should I watch for while using this medicine? Mild fever and pain should go away in 3 days or less. Report any unusual symptoms to your doctor or health care professional. What side effects may I notice from receiving this medicine? Side effects that you should report to your doctor or health care professional as soon as possible:  allergic reactions like skin rash, itching or hives, swelling of the face, lips, or tongue  breathing problems  confused  fast or irregular heartbeat  fever over 102 degrees F  seizures  unusual bleeding or bruising  unusual muscle weakness Side effects that usually do not require medical attention (report to your doctor or health care professional if they continue or are bothersome):  aches and pains  diarrhea  fever of 102 degrees F or less  headache  irritable  loss of appetite  pain, tender at site where injected  trouble sleeping This list may not describe all possible side effects. Call your doctor for medical advice about side effects. You may report side effects to FDA at 1-800-FDA-1088. Where should I keep  my medicine? This does not apply. This vaccine is given in a clinic, pharmacy, doctor's office, or other health care setting and will not be stored at home. NOTE: This sheet is a summary. It may not cover all possible information. If you have questions about this medicine, talk to your doctor, pharmacist, or health care provider.  2020 Elsevier/Gold Standard (2013-10-02 10:27:27)

## 2019-07-15 ENCOUNTER — Ambulatory Visit: Payer: Medicare Other | Admitting: Cardiology

## 2019-07-15 ENCOUNTER — Encounter: Payer: Self-pay | Admitting: Cardiology

## 2019-07-15 ENCOUNTER — Other Ambulatory Visit: Payer: Self-pay

## 2019-07-15 ENCOUNTER — Ambulatory Visit (INDEPENDENT_AMBULATORY_CARE_PROVIDER_SITE_OTHER): Payer: Medicare Other

## 2019-07-15 VITALS — BP 124/70 | HR 103 | Resp 17 | Ht 65.0 in | Wt 216.0 lb

## 2019-07-15 DIAGNOSIS — E1122 Type 2 diabetes mellitus with diabetic chronic kidney disease: Secondary | ICD-10-CM | POA: Diagnosis not present

## 2019-07-15 DIAGNOSIS — N184 Chronic kidney disease, stage 4 (severe): Secondary | ICD-10-CM

## 2019-07-15 DIAGNOSIS — I10 Essential (primary) hypertension: Secondary | ICD-10-CM

## 2019-07-15 DIAGNOSIS — Z794 Long term (current) use of insulin: Secondary | ICD-10-CM | POA: Diagnosis not present

## 2019-07-15 DIAGNOSIS — E785 Hyperlipidemia, unspecified: Secondary | ICD-10-CM | POA: Diagnosis not present

## 2019-07-15 DIAGNOSIS — R600 Localized edema: Secondary | ICD-10-CM | POA: Diagnosis not present

## 2019-07-15 DIAGNOSIS — G4733 Obstructive sleep apnea (adult) (pediatric): Secondary | ICD-10-CM | POA: Diagnosis not present

## 2019-07-15 DIAGNOSIS — I129 Hypertensive chronic kidney disease with stage 1 through stage 4 chronic kidney disease, or unspecified chronic kidney disease: Secondary | ICD-10-CM | POA: Diagnosis not present

## 2019-07-15 NOTE — Chronic Care Management (AMB) (Signed)
Chronic Care Management    Social Work Follow Up Note  07/15/2019 Name: Justin Holloway MRN: [id number] DOB: [birth year]  Justin Holloway is a 86 y.o. year old male who is a primary care patient of Justin Chard, MD. The CCM team was consulted for assistance with care coordination.   Review of patient status, including review of consultants reports, other relevant assessments, and collaboration with appropriate care team members and the patient's provider was performed as part of comprehensive patient evaluation and provision of chronic care management services.    SDOH (Social Determinants of Health) assessments performed: No    Outpatient Encounter Medications as of 07/15/2019  Medication Sig Note  . Ascorbic Acid (VITAMIN C) 1000 MG tablet Take 1,000 mg by mouth daily. (Patient not taking: Reported on 07/09/2019)   . aspirin EC 81 MG tablet Take 81 mg by mouth every evening.   . Blood Glucose Monitoring Suppl (ONE TOUCH ULTRA 2) w/Device KIT Use as directed to check blood sugars 2 times per day dx: e11.22   . cholecalciferol (VITAMIN D) 1000 units tablet Take 2,000 Units by mouth 2 (two) times daily.  (Patient not taking: Reported on 07/09/2019)   . ferrous sulfate 325 (65 FE) MG tablet TAKE 1 TABLET BY MOUTH DAILY   . furosemide (LASIX) 80 MG tablet TAKE 1 TABLET BY MOUTH IN  THE MORNING AND ONE-HALF  TABLET BY MOUTH IN THE  EARLY EVENING   . glucose blood (ONETOUCH ULTRA) test strip USE STRIPS TO TEST BLOOD  SUGAR 3 TIMES DAILY 1 STRIP PER TEST   . hydrALAZINE (APRESOLINE) 25 MG tablet TAKE 1 TABLET BY MOUTH 3  TIMES DAILY   . Insulin Degludec (TRESIBA FLEXTOUCH) 200 UNIT/ML SOPN Inject 23 Units into the skin at bedtime.    [name] lactulose (CEPHULAC) 10 g packet Take 1 packet (10 g total) by mouth 3 (three) times daily. (Patient not taking: Reported on 07/09/2019)   . Lancets (ONETOUCH DELICA PLUS [id number]) MISC 100 each by Does not apply route in the morning, at noon, and at bedtime.   [name]  LUMIGAN 0.01 % SOLN Place 1 drop into both eyes 2 (two) times daily.    . meclizine (ANTIVERT) 12.5 MG tablet TAKE 1 TABLET BY MOUTH THREE TIMES DAILY AS NEEDED FOR DIZZINESS (Patient not taking: Reported on 07/09/2019)   . Multiple Vitamin (MULTIVITAMIN WITH MINERALS) TABS tablet Take 1 tablet by mouth daily.   . polyethylene glycol powder (GLYCOLAX/MIRALAX) 17 GM/SCOOP powder Take 17 g by mouth daily.   . pravastatin (PRAVACHOL) 40 MG tablet TAKE 1 TABLET BY MOUTH DAILY   . pregabalin (LYRICA) 25 MG capsule Take 25 mg by mouth daily. (Patient not taking: Reported on 07/09/2019) 12/04/2018: Receives patient assistance  . Semaglutide,0.25 or 0.5MG/DOS, (OZEMPIC, 0.25 OR 0.5 MG/DOSE,) 2 MG/1.5ML SOPN Inject 0.5 mg into the skin once a week.    No facility-administered encounter medications on file as of 07/15/2019.     Goals Addressed            This Visit's Progress   . Collaborate with embedded PharmD and RN Care Manager to perform approrpiate assessments to assist with care management and care coordination needs       CARE PLAN ENTRY (see longitudinal plan of care for additional care plan information)  Current Barriers:  . Transportation . Limited knowledge of local senior centers . Chronic conditions including DM, CKD Stage IV, and hypertensive nephropathy that impact patient ability to perform iADL's independently  Social Work Clinical Goal(s):  [name] Over the next 45 days the patient will work with SW to become more knowledgeable of local programming opportunities to engage the patient with age appropriate activity . Over the next 60 days the patient will work with SW to become more knowledgeable to transportation resources  CCM SW Interventions: Completed 07/15/19 . Inter-disciplinary care team collaboration (see longitudinal plan of care) . Received communicated from embedded PharmD indicating the patient is interested in locating a place to play Bingo . Collaboration with Velda Shell, [place] Director with [facility] [place] is now on the monthly calendar every Tuesday at 11 am . Successful outbound call placed to the patient to communicate information nd provide education on the [employer] [facility] [place] program o Determined the patient is interested in attending Bingo games but would need access to transportation o Patient reports he would like to be included in bi-monthly newsletter mailing o Collaboration with Mrs. Silverman to add patient to senior center mailing list . Educated the patient on [employer] for transportation assistance o Patient reports he would be interested in reviewing resource with spouse prior to SW assisting with application o Mailed the patient a brochure   Patient Self Care Activities:  . Self administers medications as prescribed . Attends all scheduled provider appointments . Calls pharmacy for medication refills . Calls provider office for new concerns or questions  Initial goal documentation         Follow Up Plan: SW will follow up with patient by phone over the next month.   Daneen Schick, BSW, CDP Social Worker, Certified Dementia Practitioner [place] [facility] [facility] [phone]  Total time spent performing care coordination and/or care management activities with the patient by phone or face to face = 30 minutes.

## 2019-07-15 NOTE — Progress Notes (Signed)
Primary Physician:  [name], MD   Patient ID: Justin Holloway, male    DOB: [birth year], 86 y.o.   MRN: [id number]  Subjective:    Chief Complaint  Patient presents with  . Hypertension  . Leg Swelling  . Follow-up    HPI: Justin Holloway  is a 86 y.o. male  with type 2 diabetes, hyperlipidemia, CKD stage 3, and hypertension, autonomic orthostatic hypotension, supine hypertension, DM with chronic stage 3-4 CKD, bilateral chronic leg edema presents for onnual visit and states he just wanted to make sure all is well from cardiac standpoint.    He previously underwent lexiscan nuclear stress test in Feb 2018, that was low risk study. Echocardiogram also at that time showed normal LVEF, indeterminate diastolic filling pattern.   Patient reports that he is doing well. He has chronic leg edema. Has minimal shortness of breath with over exertion, but is able to do his normal activities fairly well. No PND or orthopnea.   Past Medical History:  Diagnosis Date  . Arthritis   . CKD (chronic kidney disease), stage III   . Diabetes mellitus without complication ([facility])   . Glaucoma    "blurred vision", see Dr Gershon Crane yearly  . Hyperlipidemia   . Hypertension   . TIA (transient ischemic attack)     Past Surgical History:  Procedure Laterality Date  . INGUINAL HERNIA REPAIR Left 06/09/2013   Procedure: OPEN REPAIR LEFT INGUINAL HERNIA;  Surgeon: [name], MD;  Location: [facility];  Service: General;  Laterality: Left;  . INSERTION OF MESH Left 06/09/2013   Procedure: INSERTION OF MESH;  Surgeon: [name], MD;  Location: [facility];  Service: General;  Laterality: Left;   Social History   Tobacco Use  . Smoking status: Former Smoker    Years: 3.00    Types: Cigars    Quit date: 1991    Years since quitting: 30.5  . Smokeless tobacco: Never Used  . Tobacco comment: socially   Substance Use Topics  . Alcohol use: Never   Marital Status: Married   Review of Systems    Cardiovascular: Positive for dyspnea on exertion and leg swelling. Negative for chest pain.  Gastrointestinal: Negative for melena.    Objective:  Blood pressure 124/70, pulse (!) 103, resp. rate 17, height 5' 5"  (1.651 m), weight 216 lb (98 kg), SpO2 99 %. Body mass index is 35.94 kg/m.   Vitals with BMI 07/15/2019 05/28/2019 05/28/2019  Height 5' 5"  5' 5"  5' 5"   Weight 216 lbs 222 lbs 222 lbs 3 oz  BMI 35.94 61.60 73.71  Systolic 062 694 854  Diastolic 70 64 64  Pulse 627 93 93      Physical Exam Vitals reviewed.  Constitutional:      Appearance: He is well-developed.     Comments: obese  Cardiovascular:     Rate and Rhythm: Normal rate and regular rhythm.     Pulses: Intact distal pulses.          Femoral pulses are 1+ on the right side and 1+ on the left side.      Popliteal pulses are 1+ on the right side and 1+ on the left side.       Dorsalis pedis pulses are 0 on the right side and 0 on the left side.       Posterior tibial pulses are 0 on the right side and 0 on the left side.  Heart sounds: Normal heart sounds.     Comments: 2-3+ pitting edema  Pulses difficult to feel due to bodily habitus Pulmonary:     Effort: Pulmonary effort is normal. No accessory muscle usage or respiratory distress.     Breath sounds: Normal breath sounds.  Abdominal:     General: Bowel sounds are normal.     Palpations: Abdomen is soft.    Radiology: No results found.  Laboratory examination:    CMP Latest Ref Rng & Units 05/21/2019 02/26/2019 10/21/2018  Glucose 70 - 99 mg/dL 119(H) 68 115(H)  BUN 8 - 23 mg/dL 55(H) 29(H) 46(H)  Creatinine 0.61 - 1.24 mg/dL 3.16(H) 3.02(H) 3.45(H)  Sodium 135 - 145 mmol/L 141 139 134(L)  Potassium 3.5 - 5.1 mmol/L 4.5 5.0 3.5  Chloride 98 - 111 mmol/L 104 103 98  CO2 22 - 32 mmol/L 27 20 23   Calcium 8.9 - 10.3 mg/dL 9.4 9.5 9.3  Total Protein 6.5 - 8.1 g/dL 7.6 7.0 8.1  Total Bilirubin 0.3 - 1.2 mg/dL 0.7 0.3 1.0  Alkaline Phos 38 - 126 U/L  57 73 62  AST 15 - 41 U/L 29 24 25   ALT 0 - 44 U/L 20 12 13    CBC Latest Ref Rng & Units 05/21/2019 10/21/2018 09/08/2017  WBC 4.0 - 10.5 K/uL 6.9 8.6 6.3  Hemoglobin 13.0 - 17.0 g/dL 11.7(L) 12.7(L) 11.3(L)  Hematocrit 39 - 52 % 37.2(L) 40.2 36.3(L)  Platelets 150 - 400 K/uL 210 214 195   Lipid Panel     Component Value Date/Time   CHOL 173 02/26/2019 1038   TRIG 69 02/26/2019 1038   HDL 58 02/26/2019 1038   CHOLHDL 3.0 02/26/2019 1038   CHOLHDL 4.0 08/08/2015 0614   VLDL 17 08/08/2015 0614   LDLCALC 102 (H) 02/26/2019 1038   HEMOGLOBIN A1C Lab Results  Component Value Date   HGBA1C 6.7 (H) 06/10/2019   MPG 209 08/08/2015   TSH No results for input(s): TSH in the last 8760 hours.  PRN Meds:. Medications Discontinued During This Encounter  Medication Reason  . meclizine (ANTIVERT) 12.5 MG tablet Patient Preference   Current Meds  Medication Sig  . Ascorbic Acid (VITAMIN C) 1000 MG tablet Take 1,000 mg by mouth daily.   [name] aspirin EC 81 MG tablet Take 81 mg by mouth every evening.  . Blood Glucose Monitoring Suppl (ONE TOUCH ULTRA 2) w/Device KIT Use as directed to check blood sugars 2 times per day dx: e11.22  . cholecalciferol (VITAMIN D) 1000 units tablet Take 2,000 Units by mouth daily.   . ferrous sulfate 325 (65 FE) MG tablet TAKE 1 TABLET BY MOUTH DAILY  . furosemide (LASIX) 80 MG tablet TAKE 1 TABLET BY MOUTH IN  THE MORNING AND ONE-HALF  TABLET BY MOUTH IN THE  EARLY EVENING  . glucose blood (ONETOUCH ULTRA) test strip USE STRIPS TO TEST BLOOD  SUGAR 3 TIMES DAILY 1 STRIP PER TEST  . hydrALAZINE (APRESOLINE) 25 MG tablet TAKE 1 TABLET BY MOUTH 3  TIMES DAILY  . Insulin Degludec (TRESIBA FLEXTOUCH) 200 UNIT/ML SOPN Inject 23 Units into the skin at bedtime.   . Lancets (ONETOUCH DELICA PLUS [id number]) MISC 100 each by Does not apply route in the morning, at noon, and at bedtime.  [name] LUMIGAN 0.01 % SOLN Place 1 drop into both eyes 2 (two) times daily.   . Multiple  Vitamin (MULTIVITAMIN WITH MINERALS) TABS tablet Take 1 tablet by mouth daily.  . pravastatin (PRAVACHOL)  40 MG tablet TAKE 1 TABLET BY MOUTH DAILY  . pregabalin (LYRICA) 25 MG capsule Take 25 mg by mouth daily.   . Semaglutide,0.25 or 0.5MG/DOS, (OZEMPIC, 0.25 OR 0.5 MG/DOSE,) 2 MG/1.5ML SOPN Inject 0.5 mg into the skin once a week.  . tamsulosin (FLOMAX) 0.4 MG CAPS capsule Take 0.4 mg by mouth daily.    Cardiac Studies:   Lexiscan myoview stress test 02/28/2016: 1. The resting electrocardiogram demonstrated normal sinus rhythm, normal resting conduction, no resting arrhythmias and nonspecific ST-T changes. Stress EKG is non-diagnostic for ischemia as it a pharmacologic stress using Lexiscan. Rare PAC noted. Stress symptoms included nausea. 2. The perfusion imaging study demonstrates soft tissue attenuation artifact in the inferior wall. There is no demonstrable ischemia or scar. LV systolic function was normal at 73% This is a low risk study.  Echocardiogram 03/01/2016: Very poor echo window and wall motion with reduced sensitivity. Left ventricle cavity is normal in size. Mild concentric hypertrophy of the left ventricle. Normal global wall motion. Indeterminate diastolic filling pattern. Calculated EF 60%. Trace mitral regurgitation. No significant change since 07/04/2012.  EKG:  EKG 07/15/2019: Normal sinus rhythm with rate of 95 bpm, normal axis.  No evidence of ischemia, normal EKG. no significant change from 10/08/2018, sinus tachycardia at rate of 100 bpm.  Assessment:     ICD-10-CM   1. Primary hypertension  I10 EKG 12-Lead    Ambulatory referral to Sleep Studies  2. Bilateral leg edema  R60.0   3. CKD (chronic kidney disease) stage 4, GFR 15-29 ml/min (HCC)  N18.4   4. Mild hyperlipidemia  E78.5   5. Obstructive sleep apnea syndrome  G47.33 Ambulatory referral to Sleep Studies     Recommendations:   Justin Holloway  is a 86 y.o.  male  with type 2 diabetes,  hyperlipidemia, CKD stage 3, and hypertension, autonomic orthostatic hypotension, supine hypertension, DM with chronic stage 3-4 CKD, bilateral chronic leg edema presents for onnual visit and states he just wanted to make sure all is well from cardiac standpoint.   Patient wanted to come and see me to make sure that his heart is okay.  His chronic leg edema is related to his weight and also lack of mobility.  Is presently 86 years of age and doing well without clinical evidence of heart failure, no heart murmurs, vascular examination is essentially normal although it is difficult to feel his popliteal pulse and femoral pulse due to his obesity.  And pedal pulses are felt in spite of 2+ bilateral pedal edema.  I do not suspect acute decompensated heart failure, EKG is normal, I have recommended that he keep his foot elevated and also to support stockings regularly.  With regard to hypertension, blood pressure is well controlled.  He is on a statin for hyperlipidemia.  His wife is concerned and patient is also concerned about obstructive sleep apnea, patient's wife states that there many times she has to wake him up as he stops breathing at night.  She would like me to refer him to sleep study.  Referral sent.  Otherwise from cardiac standpoint, he is on appropriate medical therapy, I will see him back in a as needed basis.    Adrian Prows, MD, [facility] 07/16/2019, 7:49 AM Office: [phone]

## 2019-07-15 NOTE — Patient Instructions (Signed)
Social Worker Visit Information  Goals we discussed today:  Goals Addressed            This Visit's Progress   . Collaborate with embedded PharmD and RN Care Manager to perform approrpiate assessments to assist with care management and care coordination needs       CARE PLAN ENTRY (see longitudinal plan of care for additional care plan information)  Current Barriers:  . Transportation . Limited knowledge of local senior centers . Chronic conditions including DM, CKD Stage IV, and hypertensive nephropathy that impact patient ability to perform iADL's independently  Social Work Clinical Goal(s):  [name] Over the next 45 days the patient will work with SW to become more knowledgeable of local programming opportunities to engage the patient with age appropriate activity . Over the next 60 days the patient will work with SW to become more knowledgeable to transportation resources  CCM SW Interventions: Completed 07/15/19 . Inter-disciplinary care team collaboration (see longitudinal plan of care) . Received communicated from embedded PharmD indicating the patient is interested in locating a place to play Bingo . Collaboration with Velda Shell, [place] Director with [facility] is now on the monthly calendar every Tuesday at 11 am . Successful outbound call placed to the patient to communicate information nd provide education on the [employer] [facility] [place] program o Determined the patient is interested in attending Bingo games but would need access to transportation o Patient reports he would like to be included in bi-monthly newsletter mailing o Collaboration with Mrs. Silverman to add patient to senior center mailing list . Educated the patient on [employer] (formerly known as [employer]) for transportation assistance o Patient reports he would be interested in reviewing resource with spouse prior to SW assisting with  application o Mailed the patient a brochure   Patient Self Care Activities:  . Self administers medications as prescribed . Attends all scheduled provider appointments . Calls pharmacy for medication refills . Calls provider office for new concerns or questions  Initial goal documentation         Materials Provided: Yes: mailed information on transportation resources  Follow Up Plan: SW will follow up with patient by phone over the next month.   Daneen Schick, BSW, CDP Social Worker, Certified Dementia Practitioner [place] [facility] [phone]

## 2019-07-23 ENCOUNTER — Telehealth: Payer: Self-pay

## 2019-07-24 DIAGNOSIS — R609 Edema, unspecified: Secondary | ICD-10-CM | POA: Diagnosis not present

## 2019-07-24 DIAGNOSIS — D631 Anemia in chronic kidney disease: Secondary | ICD-10-CM | POA: Diagnosis not present

## 2019-07-24 DIAGNOSIS — N2581 Secondary hyperparathyroidism of renal origin: Secondary | ICD-10-CM | POA: Diagnosis not present

## 2019-07-24 DIAGNOSIS — N189 Chronic kidney disease, unspecified: Secondary | ICD-10-CM | POA: Diagnosis not present

## 2019-07-24 DIAGNOSIS — I129 Hypertensive chronic kidney disease with stage 1 through stage 4 chronic kidney disease, or unspecified chronic kidney disease: Secondary | ICD-10-CM | POA: Diagnosis not present

## 2019-07-24 DIAGNOSIS — N184 Chronic kidney disease, stage 4 (severe): Secondary | ICD-10-CM | POA: Diagnosis not present

## 2019-07-29 DIAGNOSIS — H401133 Primary open-angle glaucoma, bilateral, severe stage: Secondary | ICD-10-CM | POA: Diagnosis not present

## 2019-08-19 ENCOUNTER — Institutional Professional Consult (permissible substitution): Payer: Medicare Other | Admitting: Neurology

## 2019-08-19 DIAGNOSIS — R8279 Other abnormal findings on microbiological examination of urine: Secondary | ICD-10-CM | POA: Diagnosis not present

## 2019-08-25 ENCOUNTER — Other Ambulatory Visit: Payer: Self-pay | Admitting: Internal Medicine

## 2019-08-25 MED ORDER — PREGABALIN 25 MG PO CAPS: 25.0000 mg | ORAL_CAPSULE | Freq: Every evening | ORAL | 1 refills | Status: DC | PRN

## 2019-09-02 ENCOUNTER — Ambulatory Visit: Payer: Medicare Other

## 2019-09-02 DIAGNOSIS — Z794 Long term (current) use of insulin: Secondary | ICD-10-CM

## 2019-09-02 DIAGNOSIS — N184 Chronic kidney disease, stage 4 (severe): Secondary | ICD-10-CM

## 2019-09-02 NOTE — Chronic Care Management (AMB) (Signed)
Chronic Care Management    Social Work Follow Up Note  09/02/2019 Name: Justin Holloway MRN: [id number] DOB: 1929/04/15  Justin Holloway is a 86 y.o. year old male who is a primary care patient of [name], MD. The CCM team was consulted for assistance with care coordination.   Review of patient status, including review of consultants reports, other relevant assessments, and collaboration with appropriate care team members and the patient's provider was performed as part of comprehensive patient evaluation and provision of chronic care management services.    SDOH (Social Determinants of Health) assessments performed: No    Outpatient Encounter Medications as of 09/02/2019  Medication Sig  . Ascorbic Acid (VITAMIN C) 1000 MG tablet Take 1,000 mg by mouth daily.   [name] aspirin EC 81 MG tablet Take 81 mg by mouth every evening.  . Blood Glucose Monitoring Suppl (ONE TOUCH ULTRA 2) w/Device KIT Use as directed to check blood sugars 2 times per day dx: e11.22  . cholecalciferol (VITAMIN D) 1000 units tablet Take 2,000 Units by mouth daily.   . ferrous sulfate 325 (65 FE) MG tablet TAKE 1 TABLET BY MOUTH DAILY  . furosemide (LASIX) 80 MG tablet TAKE 1 TABLET BY MOUTH IN  THE MORNING AND ONE-HALF  TABLET BY MOUTH IN THE  EARLY EVENING  . glucose blood (ONETOUCH ULTRA) test strip USE STRIPS TO TEST BLOOD  SUGAR 3 TIMES DAILY 1 STRIP PER TEST  . hydrALAZINE (APRESOLINE) 25 MG tablet TAKE 1 TABLET BY MOUTH 3  TIMES DAILY  . Insulin Degludec (TRESIBA FLEXTOUCH) 200 UNIT/ML SOPN Inject 23 Units into the skin at bedtime.   [name] lactulose (CEPHULAC) 10 g packet Take 1 packet (10 g total) by mouth 3 (three) times daily. (Patient not taking: Reported on 07/09/2019)  . Lancets (ONETOUCH DELICA PLUS [id number]) MISC 100 each by Does not apply route in the morning, at noon, and at bedtime.  [name] LUMIGAN 0.01 % SOLN Place 1 drop into both eyes 2 (two) times daily.   . Multiple Vitamin (MULTIVITAMIN WITH  MINERALS) TABS tablet Take 1 tablet by mouth daily.  . polyethylene glycol powder (GLYCOLAX/MIRALAX) 17 GM/SCOOP powder Take 17 g by mouth daily. (Patient not taking: Reported on 07/15/2019)  . pravastatin (PRAVACHOL) 40 MG tablet TAKE 1 TABLET BY MOUTH DAILY  . pregabalin (LYRICA) 25 MG capsule Take 1 capsule (25 mg total) by mouth at bedtime as needed.  . Semaglutide,0.25 or 0.5MG/DOS, (OZEMPIC, 0.25 OR 0.5 MG/DOSE,) 2 MG/1.5ML SOPN Inject 0.5 mg into the skin once a week.  . tamsulosin (FLOMAX) 0.4 MG CAPS capsule Take 0.4 mg by mouth daily.   No facility-administered encounter medications on file as of 09/02/2019.     Goals Addressed            This Visit's Progress   . COMPLETED: Collaborate with embedded PharmD and RN Care Manager to perform approrpiate assessments to assist with care management and care coordination needs       CARE PLAN ENTRY (see longitudinal plan of care for additional care plan information)  Current Barriers:  . Transportation . Limited knowledge of local senior centers . Chronic conditions including DM, CKD Stage IV, and hypertensive nephropathy that impact patient ability to perform iADL's independently  Social Work Clinical Goal(s):  [name] Over the next 45 days the patient will work with SW to become more knowledgeable of local programming opportunities to engage the patient with age appropriate activity . Over the next 60 days  the patient will work with SW to become more knowledgeable to transportation resources  CCM SW Interventions: Completed 09/02/19 . Successful outbound call placed to the patient to confirm receipt of mailed resource . Discussed [employer] transportation resource o The patient reports he is not interested in accessing [employer] at this time due to high numbers of COVID in the community . Advised the patient that should he be interested in the future to contact SW for assistance with application . Goal closed  Completed  07/15/19 . Inter-disciplinary care team collaboration (see longitudinal plan of care) . Received communicated from embedded PharmD indicating the patient is interested in locating a place to play Bingo . Collaboration with Velda Shell, [place] Director with [facility] [place] is now on the monthly calendar every Tuesday at 11 am . Successful outbound call placed to the patient to communicate information nd provide education on the [employer] [facility] [place] program o Determined the patient is interested in attending Bingo games but would need access to transportation o Patient reports he would like to be included in bi-monthly newsletter mailing o Collaboration with Mrs. Silverman to add patient to senior center mailing list . Educated the patient on [employer] (formerly known as [employer]) for transportation assistance o Patient reports he would be interested in reviewing resource with spouse prior to SW assisting with application o Mailed the patient a brochure   Patient Self Care Activities:  . Self administers medications as prescribed . Attends all scheduled provider appointments . Calls pharmacy for medication refills . Calls provider office for new concerns or questions  Please see past updates related to this goal by clicking on the "Past Updates" button in the selected goal          Follow Up Plan: No SW follow up planned at this time. The patient will remain active with RN Care Manager and embedded PharmD.   Daneen Schick, BSW, CDP Social Worker, Certified Dementia Practitioner [place] [facility] [place] [facility] [phone]  Total time spent performing care coordination and/or care management activities with the patient by phone or face to face =6  minutes.

## 2019-09-02 NOTE — Patient Instructions (Signed)
Social Worker Visit Information  Goals we discussed today:  Goals Addressed            This Visit's Progress   . COMPLETED: Collaborate with embedded PharmD and RN Care Manager to perform approrpiate assessments to assist with care management and care coordination needs       CARE PLAN ENTRY (see longitudinal plan of care for additional care plan information)  Current Barriers:  . Transportation . Limited knowledge of local senior centers . Chronic conditions including DM, CKD Stage IV, and hypertensive nephropathy that impact patient ability to perform iADL's independently  Social Work Clinical Goal(s):  [name] Over the next 45 days the patient will work with SW to become more knowledgeable of local programming opportunities to engage the patient with age appropriate activity . Over the next 60 days the patient will work with SW to become more knowledgeable to transportation resources  CCM SW Interventions: Completed 09/02/19 . Successful outbound call placed to the patient to confirm receipt of mailed resource . Discussed [employer] transportation resource o The patient reports he is not interested in accessing [employer] at this time due to high numbers of COVID in the community . Advised the patient that should he be interested in the future to contact SW for assistance with application . Goal closed  Completed 07/15/19 . Inter-disciplinary care team collaboration (see longitudinal plan of care) . Received communicated from embedded PharmD indicating the patient is interested in locating a place to play Bingo . Collaboration with Velda Shell, [place] Director with [facility] [place] is now on the monthly calendar every Tuesday at 11 am . Successful outbound call placed to the patient to communicate information nd provide education on the [employer] [facility] [place] program o Determined the patient is interested in attending Bingo games but  would need access to transportation o Patient reports he would like to be included in bi-monthly newsletter mailing o Collaboration with Mrs. Silverman to add patient to senior center mailing list . Educated the patient on [employer] (formerly known as [employer]) for transportation assistance o Patient reports he would be interested in reviewing resource with spouse prior to SW assisting with application o Mailed the patient a brochure   Patient Self Care Activities:  . Self administers medications as prescribed . Attends all scheduled provider appointments . Calls pharmacy for medication refills . Calls provider office for new concerns or questions  Please see past updates related to this goal by clicking on the "Past Updates" button in the selected goal         Follow Up Plan: No SW follow up planned at this time. Please contact me as needed for care coordination needs.   Daneen Schick, BSW, CDP Social Worker, Certified Dementia Practitioner [place] [facility] [place] [facility] [phone]

## 2019-09-03 ENCOUNTER — Telehealth: Payer: Self-pay

## 2019-09-10 ENCOUNTER — Other Ambulatory Visit: Payer: Self-pay

## 2019-09-10 ENCOUNTER — Encounter: Payer: Self-pay | Admitting: Internal Medicine

## 2019-09-10 ENCOUNTER — Ambulatory Visit (INDEPENDENT_AMBULATORY_CARE_PROVIDER_SITE_OTHER): Payer: Medicare Other | Admitting: Internal Medicine

## 2019-09-10 ENCOUNTER — Telehealth: Payer: Self-pay

## 2019-09-10 VITALS — BP 136/68 | HR 97 | Temp 98.0°F | Ht 65.0 in | Wt 223.0 lb

## 2019-09-10 DIAGNOSIS — Z0000 Encounter for general adult medical examination without abnormal findings: Secondary | ICD-10-CM | POA: Diagnosis not present

## 2019-09-10 DIAGNOSIS — Z794 Long term (current) use of insulin: Secondary | ICD-10-CM

## 2019-09-10 DIAGNOSIS — E1122 Type 2 diabetes mellitus with diabetic chronic kidney disease: Secondary | ICD-10-CM | POA: Diagnosis not present

## 2019-09-10 DIAGNOSIS — E559 Vitamin D deficiency, unspecified: Secondary | ICD-10-CM | POA: Diagnosis not present

## 2019-09-10 DIAGNOSIS — R2689 Other abnormalities of gait and mobility: Secondary | ICD-10-CM

## 2019-09-10 DIAGNOSIS — Z6837 Body mass index (BMI) 37.0-37.9, adult: Secondary | ICD-10-CM

## 2019-09-10 DIAGNOSIS — I129 Hypertensive chronic kidney disease with stage 1 through stage 4 chronic kidney disease, or unspecified chronic kidney disease: Secondary | ICD-10-CM | POA: Diagnosis not present

## 2019-09-10 DIAGNOSIS — N184 Chronic kidney disease, stage 4 (severe): Secondary | ICD-10-CM | POA: Diagnosis not present

## 2019-09-10 LAB — POCT URINALYSIS DIPSTICK
Bilirubin, UA: NEGATIVE
Blood, UA: NEGATIVE
Glucose, UA: NEGATIVE
Ketones, UA: NEGATIVE
Leukocytes, UA: NEGATIVE
Nitrite, UA: NEGATIVE
Protein, UA: NEGATIVE
Spec Grav, UA: 1.025 (ref 1.010–1.025)
Urobilinogen, UA: 0.2 E.U./dL
pH, UA: 5 (ref 5.0–8.0)

## 2019-09-10 NOTE — Progress Notes (Signed)
I,Tianna Badgett,acting as a Education administrator for [name], MD.,have documented all relevant documentation on the behalf of [name], MD,as directed by  [name], MD while in the presence of [name], MD.  This visit occurred during the SARS-CoV-2 public health emergency.  Safety protocols were in place, including screening questions prior to the visit, additional usage of staff PPE, and extensive cleaning of exam room while observing appropriate contact time as indicated for disinfecting solutions.  Subjective:     Patient ID: Justin Holloway , male    DOB: [birth year] , 86 y.o.   MRN: [id number]   Chief Complaint  Patient presents with  . Annual Exam  . Diabetes  . Hypertension    HPI  Patient is here for full physical. He does not have any concerns at this time. He does not wish to take shoes off for foot exam, states he is wearing his compression hose today, and does not want to remove them.   Diabetes He presents for his follow-up diabetic visit. He has type 2 diabetes mellitus. His disease course has been stable. There are no hypoglycemic associated symptoms. Pertinent negatives for diabetes include no blurred vision. There are no hypoglycemic complications. Risk factors for coronary artery disease include dyslipidemia, hypertension, diabetes mellitus, male sex, sedentary lifestyle and obesity. He is compliant with treatment most of the time. He is following a generally healthy diet. He participates in exercise intermittently. Eye exam is not current.  Hypertension This is a chronic problem. The current episode started more than 1 year ago. The problem is controlled. Pertinent negatives include no blurred vision, palpitations or shortness of breath. Past treatments include diuretics. The current treatment provides moderate improvement. Hypertensive end-organ damage includes kidney disease.     Past Medical History:  Diagnosis Date  . Arthritis   . CKD (chronic  kidney disease), stage III   . Diabetes mellitus without complication ([place])   . Glaucoma    "blurred vision", see Dr Gershon Crane yearly  . Hyperlipidemia   . Hypertension   . TIA (transient ischemic attack)      Family History  Problem Relation Age of Onset  . Cancer Mother 49       breast  . COPD Father 37       Congestive heart failure     Current Outpatient Medications:  .  Ascorbic Acid (VITAMIN C) 1000 MG tablet, Take 1,000 mg by mouth daily. , Disp: , Rfl:  .  aspirin EC 81 MG tablet, Take 81 mg by mouth every evening., Disp: , Rfl:  .  Blood Glucose Monitoring Suppl (ONE TOUCH ULTRA 2) w/Device KIT, Use as directed to check blood sugars 2 times per day dx: e11.22, Disp: 1 kit, Rfl: 0 .  cholecalciferol (VITAMIN D) 1000 units tablet, Take 2,000 Units by mouth daily. , Disp: , Rfl:  .  ferrous sulfate 325 (65 FE) MG tablet, TAKE 1 TABLET BY MOUTH DAILY, Disp: , Rfl:  .  furosemide (LASIX) 80 MG tablet, TAKE 1 TABLET BY MOUTH IN  THE MORNING AND ONE-HALF  TABLET BY MOUTH IN THE  EARLY EVENING, Disp: 135 tablet, Rfl: 3 .  glucose blood (ONETOUCH ULTRA) test strip, USE STRIPS TO TEST BLOOD  SUGAR 3 TIMES DAILY 1 STRIP PER TEST, Disp: 300 strip, Rfl: 3 .  hydrALAZINE (APRESOLINE) 25 MG tablet, TAKE 1 TABLET BY MOUTH 3  TIMES DAILY, Disp: 270 tablet, Rfl: 3 .  Insulin Degludec (TRESIBA FLEXTOUCH) 200  UNIT/ML SOPN, Inject 23 Units into the skin at bedtime. , Disp: , Rfl:  .  Lancets (ONETOUCH DELICA PLUS [id number]) MISC, 100 each by Does not apply route in the morning, at noon, and at bedtime., Disp: 300 each, Rfl: 3 .  LUMIGAN 0.01 % SOLN, Place 1 drop into both eyes 2 (two) times daily. , Disp: , Rfl:  .  Multiple Vitamin (MULTIVITAMIN WITH MINERALS) TABS tablet, Take 1 tablet by mouth daily., Disp: , Rfl:  .  polyethylene glycol powder (GLYCOLAX/MIRALAX) 17 GM/SCOOP powder, Take 17 g by mouth daily. , Disp: , Rfl:  .  pregabalin (LYRICA) 25 MG capsule, Take 1 capsule (25 mg total) by  mouth at bedtime as needed., Disp: 90 capsule, Rfl: 1 .  Semaglutide,0.25 or 0.5MG/DOS, (OZEMPIC, 0.25 OR 0.5 MG/DOSE,) 2 MG/1.5ML SOPN, Inject 0.5 mg into the skin once a week., Disp: 1.5 mL, Rfl: 1 .  tamsulosin (FLOMAX) 0.4 MG CAPS capsule, Take 0.4 mg by mouth daily., Disp: , Rfl:  .  pravastatin (PRAVACHOL) 40 MG tablet, TAKE 1 TABLET BY MOUTH  DAILY, Disp: 90 tablet, Rfl: 3   Allergies  Allergen Reactions  . Gabapentin Itching     Men's preventive visit. Patient Health Questionnaire (PHQ-2) is    Clinical Support from 05/28/2019 in [facility]  PHQ-2 Total Score 0    . Patient is on a low salt, diabetic diet. Marital status: Married. Relevant history for alcohol use is:  Social History   Substance and Sexual Activity  Alcohol Use Never  . Relevant history for tobacco use is:  Social History   Tobacco Use  Smoking Status Former Smoker  . Years: 3.00  . Types: Cigars  . Quit date: 35  . Years since quitting: 30.7  Smokeless Tobacco Never Used  Tobacco Comment   socially   .   Review of Systems  Constitutional: Negative.   HENT: Negative.   Eyes: Negative.  Negative for blurred vision.  Respiratory: Negative.  Negative for shortness of breath.   Cardiovascular: Negative.  Negative for palpitations.  Gastrointestinal: Negative.   Endocrine: Negative.   Genitourinary: Negative.   Musculoskeletal: Negative.   Skin: Negative.   Allergic/Immunologic: Negative.   Neurological: Negative.   Hematological: Negative.   Psychiatric/Behavioral: Negative.      Today's Vitals   09/10/19 0929  BP: 136/68  Pulse: 97  Temp: 98 F (36.7 C)  TempSrc: Oral  Weight: 223 lb (101.2 kg)  Height: 5' 5" (1.651 m)   Body mass index is 37.11 kg/m.   Objective:  Physical Exam Vitals and nursing note reviewed.  Constitutional:      Appearance: Normal appearance. He is obese.  HENT:     Head: Normocephalic and atraumatic.     Right Ear: Tympanic  membrane, ear canal and external ear normal.     Left Ear: Tympanic membrane, ear canal and external ear normal.     Nose:     Comments: Deferred, masked    Mouth/Throat:     Comments: Deferred, masked Eyes:     Extraocular Movements: Extraocular movements intact.     Conjunctiva/sclera: Conjunctivae normal.     Pupils: Pupils are equal, round, and reactive to light.  Cardiovascular:     Rate and Rhythm: Normal rate and regular rhythm.     Pulses: Normal pulses.     Heart sounds: Normal heart sounds.  Pulmonary:     Effort: Pulmonary effort is normal.     Breath sounds:  Normal breath sounds.  Chest:     Breasts:        Right: Normal. No swelling, bleeding, inverted nipple, mass or nipple discharge.        Left: Normal. No swelling, bleeding, inverted nipple, mass or nipple discharge.  Abdominal:     General: Bowel sounds are normal.     Palpations: Abdomen is soft.     Comments: Rounded, soft  Genitourinary:    Prostate: Normal.     Rectum: Normal. Guaiac result negative.  Musculoskeletal:        General: Normal range of motion.     Cervical back: Normal range of motion and neck supple.     Right lower leg: Edema present.     Left lower leg: Edema present.  Skin:    General: Skin is warm.  Neurological:     General: No focal deficit present.     Mental Status: He is alert and oriented to person, place, and time.     Comments: He has a shuffling gait.   Psychiatric:        Mood and Affect: Mood normal.        Behavior: Behavior normal.         Assessment And Plan:    1. Routine general medical examination at a health care facility Comments: A full exam was performed. DRE deferred, as per patient. PATIENT IS ADVISED TO GET 30-45 MINUTES REGULAR EXERCISE NO LESS THAN FOUR TO FIVE DAYS PER WEEK - BOTH WEIGHTBEARING EXERCISES AND AEROBIC ARE RECOMMENDED.  PATIENT IS ADVISED TO FOLLOW A HEALTHY DIET WITH AT LEAST SIX FRUITS/VEGGIES PER DAY, DECREASE INTAKE OF RED MEAT, AND  TO INCREASE FISH INTAKE TO TWO DAYS PER WEEK.  MEATS/FISH SHOULD NOT BE FRIED, BAKED OR BROILED IS PREFERABLE.  I SUGGEST WEARING SPF 50 SUNSCREEN ON EXPOSED PARTS AND ESPECIALLY WHEN IN THE DIRECT SUNLIGHT FOR AN EXTENDED PERIOD OF TIME.  PLEASE AVOID FAST FOOD RESTAURANTS AND INCREASE YOUR WATER INTAKE.   2. Type 2 diabetes mellitus with stage 4 chronic kidney disease, with long-term current use of insulin ([place]) Comments: He declined to remove shoes today for foot exam. I will check labs as listed below and adjust meds as needed. I DISCUSSED WITH THE PATIENT AT LENGTH REGARDING THE GOALS OF GLYCEMIC CONTROL AND POSSIBLE LONG-TERM COMPLICATIONS.  I  ALSO STRESSED THE IMPORTANCE OF COMPLIANCE WITH HOME GLUCOSE MONITORING, DIETARY RESTRICTIONS INCLUDING AVOIDANCE OF SUGARY DRINKS/PROCESSED FOODS,  ALONG WITH REGULAR EXERCISE.  I  ALSO STRESSED THE IMPORTANCE OF ANNUAL EYE EXAMS, SELF FOOT CARE AND COMPLIANCE WITH OFFICE VISITS.  - CBC - Hemoglobin A1c - CMP14+EGFR - Lipid panel - Parathyroid Hormone, Intact w/Ca - Protein electrophoresis, serum - Phosphorus - POCT Urinalysis Dipstick (81002)  3. Hypertensive nephropathy Comments: Chronic, fair control. He will continue with current meds. He is encouraged to avoid adding salt to his foods. EKG not performed, he had one in recent months. He is encouraged to limit his intake of packaged foods.   4. Vitamin D deficiency Comments: I will check vitamin D level and supplement as needed.  - VITAMIN D 25 Hydroxy (Vit-D Deficiency, Fractures)  5. Class 2 severe obesity due to excess calories with serious comorbidity and body mass index (BMI) of 37.0 to 37.9 in adult [facility]) Comments: HE is encouraged to initially strive for BMi less than 32 to decrease cardiac risk. Advised to gradually increase his daily activity level.    Wt Readings from Last 3 Encounters:  09/23/19 223 lb 3.2 oz (101.2 kg)  09/10/19 223 lb (101.2 kg)  07/15/19 216 lb (98 kg)    6. Shuffling gait Comments: He is also followed by Neuro.      Patient was given opportunity to ask questions. Patient verbalized understanding of the plan and was able to repeat key elements of the plan. All questions were answered to their satisfaction.   [name], MD   I, [name], MD, have reviewed all documentation for this visit. The documentation on 10/12/19 for the exam, diagnosis, procedures, and orders are all accurate and complete.  THE PATIENT IS ENCOURAGED TO PRACTICE SOCIAL DISTANCING DUE TO THE COVID-19 PANDEMIC.

## 2019-09-10 NOTE — Chronic Care Management (AMB) (Signed)
Chronic Care Management Pharmacy Assistant   Name: Justin Holloway  MRN: [id number] DOB: 12/24/1929  Reason for Encounter: Patient Assistance Coordination  PCP : [name], MD  Allergies:   Allergies  Allergen Reactions  . Gabapentin Itching    Medications: Outpatient Encounter Medications as of 09/10/2019  Medication Sig  . Ascorbic Acid (VITAMIN C) 1000 MG tablet Take 1,000 mg by mouth daily.   [name] aspirin EC 81 MG tablet Take 81 mg by mouth every evening.  . Blood Glucose Monitoring Suppl (ONE TOUCH ULTRA 2) w/Device KIT Use as directed to check blood sugars 2 times per day dx: e11.22  . cholecalciferol (VITAMIN D) 1000 units tablet Take 2,000 Units by mouth daily.   . ferrous sulfate 325 (65 FE) MG tablet TAKE 1 TABLET BY MOUTH DAILY  . furosemide (LASIX) 80 MG tablet TAKE 1 TABLET BY MOUTH IN  THE MORNING AND ONE-HALF  TABLET BY MOUTH IN THE  EARLY EVENING  . glucose blood (ONETOUCH ULTRA) test strip USE STRIPS TO TEST BLOOD  SUGAR 3 TIMES DAILY 1 STRIP PER TEST  . hydrALAZINE (APRESOLINE) 25 MG tablet TAKE 1 TABLET BY MOUTH 3  TIMES DAILY  . Insulin Degludec (TRESIBA FLEXTOUCH) 200 UNIT/ML SOPN Inject 23 Units into the skin at bedtime.   . Lancets (ONETOUCH DELICA PLUS [id number]) MISC 100 each by Does not apply route in the morning, at noon, and at bedtime.  [name] LUMIGAN 0.01 % SOLN Place 1 drop into both eyes 2 (two) times daily.   . Multiple Vitamin (MULTIVITAMIN WITH MINERALS) TABS tablet Take 1 tablet by mouth daily.  . polyethylene glycol powder (GLYCOLAX/MIRALAX) 17 GM/SCOOP powder Take 17 g by mouth daily.   . pravastatin (PRAVACHOL) 40 MG tablet TAKE 1 TABLET BY MOUTH DAILY  . pregabalin (LYRICA) 25 MG capsule Take 1 capsule (25 mg total) by mouth at bedtime as needed.  . Semaglutide,0.25 or 0.5MG/DOS, (OZEMPIC, 0.25 OR 0.5 MG/DOSE,) 2 MG/1.5ML SOPN Inject 0.5 mg into the skin once a week.  . tamsulosin (FLOMAX) 0.4 MG CAPS capsule Take 0.4 mg by mouth daily.    No facility-administered encounter medications on file as of 09/10/2019.    Current Diagnosis: Patient Active Problem List   Diagnosis Date Noted  . Type 2 diabetes mellitus with stage 4 chronic kidney disease, with long-term current use of insulin ([place]) 09/10/2019  . Peripheral vascular disease, unspecified ([place]) 05/28/2019  . Diabetic neuropathy ([place]) 02/18/2019  . Left upper quadrant pain 09/04/2018  . Acute midline low back pain without sciatica 09/04/2018  . CKD (chronic kidney disease) stage 4, GFR 15-29 ml/min ([facility]) 11/21/2017  . Hypertensive nephropathy 11/21/2017  . Right foot pain 11/21/2017  . Vertigo 09/09/2017  . BPPV (benign paroxysmal positional vertigo), right 09/06/2017  . Sensorineural hearing loss (SNHL), bilateral 09/06/2017  . Ataxia 02/06/2017  . UTI (lower urinary tract infection) 08/07/2015  . Dizziness 08/07/2015  . Acute renal failure superimposed on stage 3 chronic kidney disease ([place]) 08/07/2015  . Hyperkalemia 08/07/2015  . Left inguinal hernia 05/15/2013  . Diabetes mellitus without complication ([place]) 78/29/5621  . HLD (hyperlipidemia) 03/08/2006  . HYPERTENSION, BENIGN SYSTEMIC 03/08/2006  . 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 PROTEINURIA 03/08/2006     Follow-Up:  Patient Assistance Coordination- Reorder form filled out for patients Ozempic and Tresiba through [employer], awaiting provider signature to fax. Jannette Fogo, CPP aware.   09/17/2019- Ozempic and Tyler Aas Reorder forms faxed by Jannette Fogo, CPP.  09/18/2019- PAP form needed for Lyrica, waiting on dosage change.   09/22/2019- Refaxed order for Ozempic and Tyler Aas, per call with [employer] and [place], [employer], fax cover sheet was not with first fax. Will follow up on fax.  10/03/2019- Judithann Sheen, CPA called [employer] to follow up on patient Ozempic and Tyler Aas reorder, both medications were processed on 09/23/19, will arrive to providers office in  10-14 days. Jannette Fogo, CPP aware.  Pattricia Boss, [place] Pharmacist Assistant [phone]

## 2019-09-10 NOTE — Patient Instructions (Signed)

## 2019-09-11 LAB — CMP14+EGFR
ALT: 13 IU/L (ref 0–44)
AST: 20 IU/L (ref 0–40)
Albumin/Globulin Ratio: 1.6 (ref 1.2–2.2)
Albumin: 4.2 g/dL (ref 3.6–4.6)
Alkaline Phosphatase: 85 IU/L (ref 48–121)
BUN/Creatinine Ratio: 19 (ref 10–24)
BUN: 54 mg/dL — ABNORMAL HIGH (ref 8–27)
Bilirubin Total: 0.3 mg/dL (ref 0.0–1.2)
CO2: 21 mmol/L (ref 20–29)
Calcium: 9.3 mg/dL (ref 8.6–10.2)
Chloride: 100 mmol/L (ref 96–106)
Creatinine, Ser: 2.85 mg/dL — ABNORMAL HIGH (ref 0.76–1.27)
GFR calc Af Amer: 22 mL/min/{1.73_m2} — ABNORMAL LOW (ref >59)
GFR calc non Af Amer: 19 mL/min/{1.73_m2} — ABNORMAL LOW (ref >59)
Globulin, Total: 2.7 g/dL (ref 1.5–4.5)
Glucose: 86 mg/dL (ref 65–99)
Potassium: 4.8 mmol/L (ref 3.5–5.2)
Sodium: 137 mmol/L (ref 134–144)
Total Protein: 6.9 g/dL (ref 6.0–8.5)

## 2019-09-11 LAB — HEMOGLOBIN A1C
Est. average glucose Bld gHb Est-mCnc: 148 mg/dL
Hgb A1c MFr Bld: 6.8 % — ABNORMAL HIGH (ref 4.8–5.6)

## 2019-09-11 LAB — CBC
Hematocrit: 35.9 % — ABNORMAL LOW (ref 37.5–51.0)
Hemoglobin: 11.7 g/dL — ABNORMAL LOW (ref 13.0–17.7)
MCH: 29.4 pg (ref 26.6–33.0)
MCHC: 32.6 g/dL (ref 31.5–35.7)
MCV: 90 fL (ref 79–97)
Platelets: 202 10*3/uL (ref 150–450)
RBC: 3.98 x10E6/uL — ABNORMAL LOW (ref 4.14–5.80)
RDW: 12.4 % (ref 11.6–15.4)
WBC: 6.8 10*3/uL (ref 3.4–10.8)

## 2019-09-11 LAB — LIPID PANEL
Chol/HDL Ratio: 3 ratio (ref 0.0–5.0)
Cholesterol, Total: 173 mg/dL (ref 100–199)
HDL: 57 mg/dL (ref >39)
LDL Chol Calc (NIH): 104 mg/dL — ABNORMAL HIGH (ref 0–99)
Triglycerides: 64 mg/dL (ref 0–149)
VLDL Cholesterol Cal: 12 mg/dL (ref 5–40)

## 2019-09-11 LAB — VITAMIN D 25 HYDROXY (VIT D DEFICIENCY, FRACTURES): Vit D, 25-Hydroxy: 34.1 ng/mL (ref 30.0–100.0)

## 2019-09-12 LAB — PROTEIN ELECTROPHORESIS, SERUM
A/G Ratio: 1.1 (ref 0.7–1.7)
Albumin ELP: 3.4 g/dL (ref 2.9–4.4)
Alpha 1: 0.3 g/dL (ref 0.0–0.4)
Alpha 2: 0.8 g/dL (ref 0.4–1.0)
Beta: 0.9 g/dL (ref 0.7–1.3)
Gamma Globulin: 1.2 g/dL (ref 0.4–1.8)
Globulin, Total: 3.2 g/dL (ref 2.2–3.9)
Total Protein: 6.6 g/dL (ref 6.0–8.5)

## 2019-09-12 LAB — PTH, INTACT AND CALCIUM
Calcium: 9.4 mg/dL (ref 8.6–10.2)
PTH: 91 pg/mL — ABNORMAL HIGH (ref 15–65)

## 2019-09-12 LAB — PHOSPHORUS: Phosphorus: 4.7 mg/dL — ABNORMAL HIGH (ref 2.8–4.1)

## 2019-09-17 ENCOUNTER — Ambulatory Visit: Payer: Self-pay

## 2019-09-17 ENCOUNTER — Other Ambulatory Visit: Payer: Self-pay

## 2019-09-17 DIAGNOSIS — Z794 Long term (current) use of insulin: Secondary | ICD-10-CM

## 2019-09-17 DIAGNOSIS — I129 Hypertensive chronic kidney disease with stage 1 through stage 4 chronic kidney disease, or unspecified chronic kidney disease: Secondary | ICD-10-CM

## 2019-09-17 NOTE — Chronic Care Management (AMB) (Signed)
Chronic Care Management Pharmacy  Name: Justin Holloway  MRN: [id number] DOB: [birth year]  Chief Complaint/ HPI  Justin Holloway,  86 y.o. , male presents for their Follow-Up CCM visit with the clinical pharmacist via telephone due to COVID-19 Pandemic.  PCP : [name], MD  Their chronic conditions include: Hypertension, Peripheral Vascular disease, Diabetes mellitus, Diabetic neuropathy, CKD stage 4, Hyperlipidemia, Vertigo  Office Visits: 09/10/19 OV: Physical exam. HgbA1c Is 6.8%. Kidney function stable. Vitamin D is low. LDL is 104 (above goal of 70).   06/18/2019 Telephone call: Tyler Aas reduced to 23 units nightly since HgbA1c is 6.7%.   05/28/2019 AWV and OV:  Presented for DM and HTN follow up. Encouraged to avoid salting foods. EKG for chest pain. NSR w/o acute changes. Recommended 11-20 lb weight loss. Reminded to do chair exercises. Shingrix vaccine discussed. Recommend annual visit for glaucoma screening.   04/28/2019 - UTI - rocephin injection in office. Second UTI in 1 month. Sending urine for culture. Pt did not stop by lab for PSA.   02/28/2019 - Lab results telephone call. Cholesterol above goal - is patient taking cholesterol med? A1c 7%. Stable Kidney Function.   02/26/2019 OV: Presented for Dm and HTN follow up. Referral to pain clinic for neuropathy since no relief from gabapentin or Lyrica. Complained or urinary frequency and wants to stop fluid pills. Labs ordered (CMP14+EGFR, HgbA1c, lipid panel, UA and culture).   Consult Visits: 07/15/19 Cardiology OV w/ Dr. Einar Gip: Presented for annual follow up. Doing well, but reports chronic leg edema. No clinical evidence of heart failure. EKG normal. Recommend keep feet elevated and use support stockings regularly. Referred for sleep study due to pt stopping breathing at night. Continue current medications.   05/23/2019 - ED f/u call - urine culture positive for UTI. If symptoms improving with antibiotic continue as  prescribed.   05/21/2019 - ED visit -acute cystitis and ileus due to constipation on x-ray. Prescribed Keflex 500 mg qid.  05/21/2019 - Urgent Care for abdominal pain/back pain - x-ray showed ileus. Referred to ED.   03/25/19 Pain management OV w/ P. Campbell: Opioid not recommended. Pt can discontinue Lyrica if ineffective. Recommended capsaicin cream.   02/18/2019 - Podiatry - debridement of mycotic and hypertrophic toenails. F/U 3 months.   01/21/2019 - Ophthalmology - eye exam.  CCM Visits: 09/02/19 SW: Discussed [facility] transportation  06/02/19 RN: referral to pain management for neuropathy. Work to modify factors to minimize shifts in glucose level.   02/26/19 PharmD: patient approved for novo nordisk PAP and picked up 4 month supply of Ozempic and [place].    02/12/19 PharmD: coordinated with CPhT for patient assistance.  01/22/19 RN: checking on referral to [name]. Nurse contacted Dr. Si Gaul office to follow-up and provide clinical documentation.   01/22/19 - Patient approved for Novo nordisk Tyler Aas and Ozempic) patient assistance and Lyrica.   01/15/19 PharmD: Diabetes related education diabetes-friendly diet, discussed injection glp-1 technique, reviewed medication purpose/side effects.   01/08/19 CPhT: Received provider portion(s) of patient assistance application(s) for Lyrica. Faxed completed application and required documents into [employer].  01/06/19 CPhT: Received patient portion(s) of patient assistance application(s) for Ozempic, Tresiba and Lyrica. Will fax completed application and required documents into [employer] for [place] and Ozempic on 01/13/19.  Medications: Outpatient Encounter Medications as of 09/17/2019  Medication Sig  . Ascorbic Acid (VITAMIN C) 1000 MG tablet Take 1,000 mg by mouth daily.   [name] aspirin EC 81 MG tablet Take 81 mg  by mouth every evening.  . Blood Glucose Monitoring Suppl (ONE TOUCH ULTRA 2) w/Device KIT Use as directed to check blood  sugars 2 times per day dx: e11.22  . cholecalciferol (VITAMIN D) 1000 units tablet Take 2,000 Units by mouth daily.   . ferrous sulfate 325 (65 FE) MG tablet TAKE 1 TABLET BY MOUTH DAILY  . furosemide (LASIX) 80 MG tablet TAKE 1 TABLET BY MOUTH IN  THE MORNING AND ONE-HALF  TABLET BY MOUTH IN THE  EARLY EVENING  . glucose blood (ONETOUCH ULTRA) test strip USE STRIPS TO TEST BLOOD  SUGAR 3 TIMES DAILY 1 STRIP PER TEST  . hydrALAZINE (APRESOLINE) 25 MG tablet TAKE 1 TABLET BY MOUTH 3  TIMES DAILY  . Insulin Degludec (TRESIBA FLEXTOUCH) 200 UNIT/ML SOPN Inject 23 Units into the skin at bedtime.   . Lancets (ONETOUCH DELICA PLUS [id number]) MISC 100 each by Does not apply route in the morning, at noon, and at bedtime.  [name] LUMIGAN 0.01 % SOLN Place 1 drop into both eyes 2 (two) times daily.   . Multiple Vitamin (MULTIVITAMIN WITH MINERALS) TABS tablet Take 1 tablet by mouth daily.  . polyethylene glycol powder (GLYCOLAX/MIRALAX) 17 GM/SCOOP powder Take 17 g by mouth daily.   . pregabalin (LYRICA) 25 MG capsule Take 1 capsule (25 mg total) by mouth at bedtime as needed.  . Semaglutide,0.25 or 0.5MG/DOS, (OZEMPIC, 0.25 OR 0.5 MG/DOSE,) 2 MG/1.5ML SOPN Inject 0.5 mg into the skin once a week.  . tamsulosin (FLOMAX) 0.4 MG CAPS capsule Take 0.4 mg by mouth daily.  . [DISCONTINUED] pravastatin (PRAVACHOL) 40 MG tablet TAKE 1 TABLET BY MOUTH DAILY   No facility-administered encounter medications on file as of 09/17/2019.   Allergies  Allergen Reactions  . Gabapentin Itching   Current Diagnosis/Assessment:  SDOH Interventions     Most Recent Value  SDOH Interventions  Financial Strain Interventions Other (Comment)  [Assisted with applying for refills for [place] and Ozempic. Will assist with Lyrica patient assistance if needed]       Goals Addressed            This Visit's Progress   . Pharmacy Care Plan       CARE PLAN ENTRY (see longitudinal plan of care for additional care plan  information)  Current Barriers:  . Chronic Disease Management support, education, and care coordination needs related to Hypertension, Hyperlipidemia, and Diabetes   Hypertension BP Readings from Last 3 Encounters:  05/28/19 118/64  05/28/19 118/64  05/21/19 132/69   . Pharmacist Clinical Goal(s): o Over the next 180 days, patient will work with PharmD and providers to maintain BP goal <130/80 . Current regimen:  o Hydralazine 74m three times daily o Furosemide 825mevery morning and 4066mvery evening . Interventions: o Dietary and exercise recommendations provided . Patient self care activities - Over the next 90 days, patient will: o Check BP if symptomatic, document, and provide at future appointments o Ensure daily salt intake < 2300 mg/day o Try to exercise for 30 minutes 5 times weekly, 150 minutes per week total  Hyperlipidemia Lab Results  Component Value Date/Time   LDLCALC 102 (H) 02/26/2019 10:38 AM   . Pharmacist Clinical Goal(s): o Over the next 90 days, patient will work with PharmD and providers to achieve LDL goal < 70 . Current regimen:  o Pravastatin 28m88mily . Interventions: o Discuss with PCP, changing to atorvastatin 28mg28mly due to LDL above goal and kidney function o Provided dietary  and exercise recommendations . Patient self care activities - Over the next 90 days, patient will: o Start atorvastatin 13m daily, if PCP agrees to switch medication o Try to exercise for 30 minutes 5 times weekly, 150 minutes per week total  Diabetes Lab Results  Component Value Date/Time   HGBA1C 6.7 (H) 06/10/2019 10:00 AM   HGBA1C 7.0 (H) 02/26/2019 10:38 AM   . Pharmacist Clinical Goal(s): o Over the next 180 days, patient will work with PharmD and providers to maintain A1c goal <7% . Current regimen:  o Tresiba 23 units nightly o Ozempic 0.554mweekly . Interventions: o Completed and faxed refill/reorder form for [place] Ozempic to [employer] - Determined patient received sample at last office visit o Will discuss increasing Lyrica to 50102maily with PCP for neuropathy o Provided dietary and exercise recommendations . Patient self care activities - Over the next 90 days, patient will: o Check blood sugar once daily, document, and provide at future appointments o Contact provider with any episodes of hypoglycemia o Increase Lyrica to 34m77mily if PCP agrees o Try to exercise for 30 minutes 5 times weekly, 150 minutes per week total  Medication management . Pharmacist Clinical Goal(s): o Over the next 90 days, patient will work with PharmD and providers to maintain optimal medication adherence . [facility] . Interventions o Comprehensive medication review performed. o Continue current medication management strategy . Patient self care activities - Over the next 90 days, patient will: o Focus on medication adherence by continued use of pill box o Take medications as prescribed o Report any questions or concerns to PharmD and/or provider(s)  Please see past updates related to this goal by clicking on the "Past Updates" button in the selected goal        Hyperlipidemia   LDL goal < 70  Lipid Panel     Component Value Date/Time   CHOL 173 09/10/2019 1029   TRIG 64 09/10/2019 1029   HDL 57 09/10/2019 1029   LDLCALC 104 (H) 09/10/2019 1029    Hepatic Function Latest Ref Rng & Units 09/10/2019 09/10/2019 05/21/2019  Total Protein 6.0 - 8.5 g/dL 6.9 6.6 7.6  Albumin 3.6 - 4.6 g/dL 4.2 - 4.1  AST 0 - 40 IU/L 20 - 29  ALT 0 - 44 IU/L 13 - 20  Alk Phosphatase 48 - 121 IU/L 85 - 57  Total Bilirubin 0.0 - 1.2 mg/dL 0.3 - 0.7    The ASCVD Risk score (GofMikey BussingJr., et al., 2013) failed to calculate for the following reasons:   The 2013 ASCVD risk score is only valid for ages 40 t2579  55atient has failed these meds in past: N/A Patient is currently uncontrolled on the  following medications:  . Pravastatin 40 mg daily  Aspirin EC 81 mg daily  We discussed:  Diet and exercise extensively Discussed possibility of changing to atorvastatin if PCP agrees  Plan Continue current medications Discuss w/ PCP changing pravastatin to atorvastatin 40mg52mly due to GFR of 22 and LDL above goal (via in basket message)  Diabetes   Recent Relevant Labs: Lab Results  Component Value Date/Time   HGBA1C 6.8 (H) 09/10/2019 10:29 AM   HGBA1C 6.7 (H) 06/10/2019 10:00 AM   MICROALBUR 80 05/28/2019 12:29 PM   MICROALBUR 80 12/26/2017 04:58 PM   Kidney Function Lab Results  Component Value Date/Time   CREATININE 2.85 (H) 09/10/2019 10:29 AM  CREATININE 3.16 (H) 05/21/2019 02:00 PM   GFRNONAA 19 (L) 09/10/2019 10:29 AM   GFRAA 22 (L) 09/10/2019 10:29 AM   K 4.8 09/10/2019 10:29 AM   K 4.5 05/21/2019 02:00 PM   Checking BG: Daily  Recent FBG Readings: 107, 110, 115 Recent pre-meal BG readings:  Recent 2hr PP BG readings:   Recent HS BG readings:  Patient has failed these meds in past: Tanzeum, glimepiride, Toujeo, metformin, pioglitazone Patient is currently controlled on the following medications:   Tresiba Flextouch 23 units into skin at bedtime  Ozempic 0.5 mg once weekly  Last diabetic Foot exam: Sees podiatrist Last diabetic Eye exam: Sees ophthalmologist  We discussed:  Applying for refills for Ozempic and Tresiba through [employer] patient assistance program  Faxed refill/reorder form today  Pt received samples at last office visit   Administering Ozempic every Saturday  Plan Continue current medications    Hypertension   Office blood pressures are  BP Readings from Last 3 Encounters:  09/23/19 126/64  09/10/19 136/68  07/15/19 124/70   Patient has failed these meds in the past: Amlodipine, Edarbi, losartan, losartan-hctz Patient is currently controlled on the following medications:   Furosemide 80 mg every morning and 40 mg  early evening  Hydralazine 25 mg three times daily  Patient checks BP at home Never, does not have a monitor at home  We discussed:  Pt says he never has any trouble with BP  Doesn't use any salt on his food, just pepper  Plan Continue current medications   Neuropathy   Patient has failed these meds in past: Gabapentin  Patient is currently controlled on the following medications:   Pregabalin 25 mg daily  We discussed:    Lyrica helps some with neuropathy  Pt used to receive Lyrica from PAP but has not received refills lately  Plan Continue current medications  Discuss increasing Lyrica dose with PCP to 74m once daily (via in basket message) If pt is going to continue Lyrica, contact PAP regarding refill   Constipation   Patient has failed these meds in past: Senna-docusate, Linzess, Lactulose Patient is currently controlled on the following medications:  Miralax daily  Plan Continue current medications  Over the Counter Medications   Vitamin D 34.1 on 09/10/19  Patient is currently  on the following medications:  . Ferrous sulfate 325 mg twice daily . Multivitamin daily  . Vitamin D3 1000 units daily  Plan Continue current medications   Vaccines   Reviewed and discussed patient's vaccination history.    Immunization History  Administered Date(s) Administered  . Fluad Quad(high Dose 65+) 09/23/2019  . Influenza, High Dose Seasonal PF 11/21/2017, 08/29/2018  . PFIZER SARS-COV-2 Vaccination 02/01/2019, 03/06/2019  . Pneumococcal Polysaccharide-23 12/10/1998   Provided patient education regarding Shingrix   Plan Recommended patient receive Prevnar13 followed by Shingrix vaccines series 1 month later at pharmacy  Medication Management   [facility] Uses pill box? Yes Pt endorses 100% compliance  We discussed:  Importance of taking medication daily as directed  Plan Continue current medication  management strategy   Follow up: 3 month phone visit  CJannette Fogo PharmD Clinical Pharmacist [facility] [phone]

## 2019-09-23 ENCOUNTER — Other Ambulatory Visit: Payer: Self-pay

## 2019-09-23 ENCOUNTER — Ambulatory Visit (INDEPENDENT_AMBULATORY_CARE_PROVIDER_SITE_OTHER): Payer: Medicare Other

## 2019-09-23 VITALS — BP 126/64 | HR 93 | Temp 97.9°F | Ht 65.0 in | Wt 223.2 lb

## 2019-09-23 DIAGNOSIS — Z23 Encounter for immunization: Secondary | ICD-10-CM | POA: Diagnosis not present

## 2019-09-23 NOTE — Progress Notes (Signed)
PATIENT IS HERE FOR FLU VACCINATION.

## 2019-09-30 DIAGNOSIS — E1140 Type 2 diabetes mellitus with diabetic neuropathy, unspecified: Secondary | ICD-10-CM | POA: Diagnosis not present

## 2019-09-30 DIAGNOSIS — E119 Type 2 diabetes mellitus without complications: Secondary | ICD-10-CM | POA: Diagnosis not present

## 2019-09-30 DIAGNOSIS — L84 Corns and callosities: Secondary | ICD-10-CM | POA: Diagnosis not present

## 2019-10-05 ENCOUNTER — Other Ambulatory Visit: Payer: Self-pay | Admitting: Nurse Practitioner

## 2019-10-07 NOTE — Patient Instructions (Addendum)
Visit Information  Goals Addressed            This Visit's Progress   . Pharmacy Care Plan       CARE PLAN ENTRY (see longitudinal plan of care for additional care plan information)  Current Barriers:  . Chronic Disease Management support, education, and care coordination needs related to Hypertension, Hyperlipidemia, and Diabetes   Hypertension BP Readings from Last 3 Encounters:  05/28/19 118/64  05/28/19 118/64  05/21/19 132/69   . Pharmacist Clinical Goal(s): o Over the next 180 days, patient will work with PharmD and providers to maintain BP goal <130/80 . Current regimen:  o Hydralazine 25mg  three times daily o Furosemide 80mg  every morning and 40mg  every evening . Interventions: o Dietary and exercise recommendations provided . Patient self care activities - Over the next 90 days, patient will: o Check BP if symptomatic, document, and provide at future appointments o Ensure daily salt intake < 2300 mg/day o Try to exercise for 30 minutes 5 times weekly, 150 minutes per week total  Hyperlipidemia Lab Results  Component Value Date/Time   LDLCALC 102 (H) 02/26/2019 10:38 AM   . Pharmacist Clinical Goal(s): o Over the next 90 days, patient will work with PharmD and providers to achieve LDL goal < 70 . Current regimen:  o Pravastatin 40mg  daily . Interventions: o Discuss with PCP, changing to atorvastatin 40mg  daily due to LDL above goal and kidney function o Provided dietary and exercise recommendations . Patient self care activities - Over the next 90 days, patient will: o Start atorvastatin 40mg  daily, if PCP agrees to switch medication o Try to exercise for 30 minutes 5 times weekly, 150 minutes per week total  Diabetes Lab Results  Component Value Date/Time   HGBA1C 6.7 (H) 06/10/2019 10:00 AM   HGBA1C 7.0 (H) 02/26/2019 10:38 AM   . Pharmacist Clinical Goal(s): o Over the next 180 days, patient will work with PharmD and providers to maintain A1c goal  <7% . Current regimen:  o Tresiba 23 units nightly o Ozempic 0.5mg  weekly . Interventions: o Completed and faxed refill/reorder form for [place] and Ozempic to [employer] patient assistance program - Determined patient received sample at last office visit o Will discuss increasing Lyrica to 50mg  daily with PCP for neuropathy o Provided dietary and exercise recommendations . Patient self care activities - Over the next 90 days, patient will: o Check blood sugar once daily, document, and provide at future appointments o Contact provider with any episodes of hypoglycemia o Increase Lyrica to 50mg  daily if PCP agrees o Try to exercise for 30 minutes 5 times weekly, 150 minutes per week total  Medication management . Pharmacist Clinical Goal(s): o Over the next 90 days, patient will work with PharmD and providers to maintain optimal medication adherence . [facility] . Interventions o Comprehensive medication review performed. o Continue current medication management strategy . Patient self care activities - Over the next 90 days, patient will: o Focus on medication adherence by continued use of pill box o Take medications as prescribed o Report any questions or concerns to PharmD and/or provider(s)  Please see past updates related to this goal by clicking on the "Past Updates" button in the selected goal         The patient verbalized understanding of instructions provided today and agreed to receive a mailed copy of patient instruction and/or educational materials.  Telephone follow up appointment with pharmacy team member scheduled  for: 12/16/19 @ 4:15 PM  Jannette Fogo, PharmD Clinical Pharmacist [facility] [phone]   Fat and Cholesterol Restricted Eating Plan Eating a diet that limits fat and cholesterol may help lower your risk for heart disease and other conditions. Your body needs fat and cholesterol for basic  functions, but eating too much of these things can be harmful to your health. Your health care provider may order lab tests to check your blood fat (lipid) and cholesterol levels. This helps your health care provider understand your risk for certain conditions and whether you need to make diet changes. Work with your health care provider or dietitian to make an eating plan that is right for you. Your plan includes:  Limit your fat intake to ______% or less of your total calories a day.  Limit your saturated fat intake to ______% or less of your total calories a day.  Limit the amount of cholesterol in your diet to less than _________mg a day.  Eat ___________ g of fiber a day. What are tips for following this plan? General guidelines   If you are overweight, work with your health care provider to lose weight safely. Losing just 5-10% of your body weight can improve your overall health and help prevent diseases such as diabetes and heart disease.  Avoid: ? Foods with added sugar. ? Fried foods. ? Foods that contain partially hydrogenated oils, including stick margarine, some tub margarines, cookies, crackers, and other baked goods.  Limit alcohol intake to no more than 1 drink a day for nonpregnant women and 2 drinks a day for men. One drink equals 12 oz of beer, 5 oz of wine, or 1 oz of hard liquor. Reading food labels  Check food labels for: ? Trans fats, partially hydrogenated oils, or high amounts of saturated fat. Avoid foods that contain saturated fat and trans fat. ? The amount of cholesterol in each serving. Try to eat no more than 200 mg of cholesterol each day. ? The amount of fiber in each serving. Try to eat at least 20-30 g of fiber each day.  Choose foods with healthy fats, such as: ? Monounsaturated and polyunsaturated fats. These include olive and canola oil, flaxseeds, walnuts, almonds, and seeds. ? Omega-3 fats. These are found in foods such as salmon, mackerel,  sardines, tuna, flaxseed oil, and ground flaxseeds.  Choose grain products that have whole grains. Look for the word "whole" as the first word in the ingredient list. Cooking  Cook foods using methods other than frying. Baking, boiling, grilling, and broiling are some healthy options.  Eat more home-cooked food and less restaurant, buffet, and fast food.  Avoid cooking using saturated fats. ? Animal sources of saturated fats include meats, butter, and cream. ? Plant sources of saturated fats include palm oil, palm kernel oil, and coconut oil. Meal planning   At meals, imagine dividing your plate into fourths: ? Fill one-half of your plate with vegetables and green salads. ? Fill one-fourth of your plate with whole grains. ? Fill one-fourth of your plate with lean protein foods.  Eat fish that is high in omega-3 fats at least two times a week.  Eat more foods that contain fiber, such as whole grains, beans, apples, broccoli, carrots, peas, and barley. These foods help promote healthy cholesterol levels in the blood. Recommended foods Grains  Whole grains, such as whole wheat or whole grain breads, crackers, cereals, and pasta. Unsweetened oatmeal, bulgur, barley, quinoa, or brown rice. Corn  or whole wheat flour tortillas. Vegetables  Fresh or frozen vegetables (raw, steamed, roasted, or grilled). Green salads. Fruits  All fresh, canned (in natural juice), or frozen fruits. Meats and other protein foods  Ground beef (85% or leaner), grass-fed beef, or beef trimmed of fat. Skinless chicken or [place]. Ground chicken or [place]. Pork trimmed of fat. All fish and seafood. Egg whites. Dried beans, peas, or lentils. Unsalted nuts or seeds. Unsalted canned beans. Natural nut butters without added sugar and oil. Dairy  Low-fat or nonfat dairy products, such as skim or 1% milk, 2% or reduced-fat cheeses, low-fat and fat-free ricotta or cottage cheese, or plain low-fat and nonfat  yogurt. Fats and oils  Tub margarine without trans fats. Light or reduced-fat mayonnaise and salad dressings. Avocado. Olive, canola, sesame, or safflower oils. The items listed above may not be a complete list of recommended foods or beverages. Contact your dietitian for more options. Foods to avoid Grains  White bread. White pasta. White rice. Cornbread. Bagels, pastries, and croissants. Crackers and snack foods that contain trans fat and hydrogenated oils. Vegetables  Vegetables cooked in cheese, cream, or butter sauce. Fried vegetables. Fruits  Canned fruit in heavy syrup. Fruit in cream or butter sauce. Fried fruit. Meats and other protein foods  Fatty cuts of meat. Ribs, chicken wings, bacon, sausage, bologna, salami, chitterlings, fatback, hot dogs, bratwurst, and packaged lunch meats. Liver and organ meats. Whole eggs and egg yolks. Chicken and [place] with skin. Fried meat. Dairy  Whole or 2% milk, cream, half-and-half, and cream cheese. Whole milk cheeses. Whole-fat or sweetened yogurt. Full-fat cheeses. Nondairy creamers and whipped toppings. Processed cheese, cheese spreads, and cheese curds. Beverages  Alcohol. Sugar-sweetened drinks such as sodas, lemonade, and fruit drinks. Fats and oils  Butter, stick margarine, lard, shortening, ghee, or bacon fat. Coconut, palm kernel, and palm oils. Sweets and desserts  Corn syrup, sugars, honey, and molasses. Candy. Jam and jelly. Syrup. Sweetened cereals. Cookies, pies, cakes, donuts, muffins, and ice cream. The items listed above may not be a complete list of foods and beverages to avoid. Contact your dietitian for more information. Summary  Your body needs fat and cholesterol for basic functions. However, eating too much of these things can be harmful to your health.  Work with your health care provider and dietitian to follow a diet low in fat and cholesterol. Doing this may help lower your risk for heart disease and other  conditions.  Choose healthy fats, such as monounsaturated and polyunsaturated fats, and foods high in omega-3 fatty acids.  Eat fiber-rich foods, such as whole grains, beans, peas, fruits, and vegetables.  Limit or avoid alcohol, fried foods, and foods high in saturated fats, partially hydrogenated oils, and sugar. This information is not intended to replace advice given to you by your health care provider. Make sure you discuss any questions you have with your health care provider. Document Revised: 12/08/2016 Document Reviewed: 09/12/2016 Elsevier Patient Education  [place].

## 2019-10-08 ENCOUNTER — Telehealth: Payer: Self-pay

## 2019-10-08 NOTE — Telephone Encounter (Signed)
Justin Holloway was notified that the pt's ozempic and tresiba  has been delivered from [employer] Patient assistance program and is ready for pickup.

## 2019-10-10 ENCOUNTER — Telehealth: Payer: Self-pay

## 2019-10-10 NOTE — Telephone Encounter (Signed)
I left the pt a message that his pen needles from the [employer] patient assistance has been delivered and is ready for pickup.

## 2019-10-13 ENCOUNTER — Telehealth: Payer: Self-pay

## 2019-10-13 ENCOUNTER — Telehealth: Payer: Medicare Other

## 2019-10-13 NOTE — Telephone Encounter (Cosign Needed)
  Chronic Care Management   Outreach Note  10/13/2019 Name: Justin Holloway MRN: [id number] DOB: 1929-06-13  Referred by: [name], MD Reason for referral : Chronic Care Management (CCM RN CM FU Call )   An unsuccessful telephone outreach was attempted today. The patient was referred to the case management team for assistance with care management and care coordination.   Follow Up Plan: A HIPAA compliant phone message was left for the patient providing contact information and requesting a return call.  Telephone follow up appointment with care management team member scheduled for: 11/17/19  Barb Merino, RN, BSN, CCM Care Management Coordinator [facility]  Direct Phone: [phone]

## 2019-10-29 ENCOUNTER — Other Ambulatory Visit: Payer: Self-pay

## 2019-10-29 ENCOUNTER — Ambulatory Visit (INDEPENDENT_AMBULATORY_CARE_PROVIDER_SITE_OTHER): Payer: Medicare Other | Admitting: Internal Medicine

## 2019-10-29 ENCOUNTER — Encounter: Payer: Self-pay | Admitting: Internal Medicine

## 2019-10-29 VITALS — BP 110/60 | HR 96 | Temp 98.1°F | Ht 65.0 in | Wt 221.4 lb

## 2019-10-29 DIAGNOSIS — N184 Chronic kidney disease, stage 4 (severe): Secondary | ICD-10-CM

## 2019-10-29 DIAGNOSIS — Z794 Long term (current) use of insulin: Secondary | ICD-10-CM

## 2019-10-29 DIAGNOSIS — N2581 Secondary hyperparathyroidism of renal origin: Secondary | ICD-10-CM

## 2019-10-29 DIAGNOSIS — E1122 Type 2 diabetes mellitus with diabetic chronic kidney disease: Secondary | ICD-10-CM

## 2019-10-29 DIAGNOSIS — N5201 Erectile dysfunction due to arterial insufficiency: Secondary | ICD-10-CM | POA: Diagnosis not present

## 2019-10-29 DIAGNOSIS — H401133 Primary open-angle glaucoma, bilateral, severe stage: Secondary | ICD-10-CM | POA: Diagnosis not present

## 2019-10-29 DIAGNOSIS — I129 Hypertensive chronic kidney disease with stage 1 through stage 4 chronic kidney disease, or unspecified chronic kidney disease: Secondary | ICD-10-CM

## 2019-10-29 DIAGNOSIS — Z6836 Body mass index (BMI) 36.0-36.9, adult: Secondary | ICD-10-CM

## 2019-10-29 NOTE — Progress Notes (Signed)
I,Tianna Badgett,acting as a Education administrator for [name], MD.,have documented all relevant documentation on the behalf of [name], MD,as directed by  [name], MD while in the presence of [name], MD.  This visit occurred during the SARS-CoV-2 public health emergency.  Safety protocols were in place, including screening questions prior to the visit, additional usage of staff PPE, and extensive cleaning of exam room while observing appropriate contact time as indicated for disinfecting solutions.  Subjective:     Patient ID: Justin Holloway , male    DOB: Dec 30, 1929 , 86 y.o.   MRN: [id number]   Chief Complaint  Patient presents with  . Diabetes    HPI  He presents today for diabetes. He reports compliance with meds. He denies shortness of breath and palpitations. He would like to discuss options for erectile dysfunction.   Diabetes He presents for his follow-up diabetic visit. He has type 2 diabetes mellitus. His disease course has been stable. There are no hypoglycemic associated symptoms. There are no hypoglycemic complications. Risk factors for coronary artery disease include dyslipidemia, hypertension, diabetes mellitus, male sex, sedentary lifestyle and obesity. He is compliant with treatment most of the time. He is following a generally healthy diet. He participates in exercise intermittently. Eye exam is not current.     Past Medical History:  Diagnosis Date  . Arthritis   . CKD (chronic kidney disease), stage III ([place])   . Diabetes mellitus without complication ([place])   . Glaucoma    "blurred vision", see Dr Gershon Crane yearly  . Hyperlipidemia   . Hypertension   . TIA (transient ischemic attack)      Family History  Problem Relation Age of Onset  . Cancer Mother 13       breast  . COPD Father 76       Congestive heart failure     Current Outpatient Medications:  .  Ascorbic Acid (VITAMIN C) 1000 MG tablet, Take 1,000 mg by mouth daily. , Disp: ,  Rfl:  .  aspirin EC 81 MG tablet, Take 81 mg by mouth every evening., Disp: , Rfl:  .  Blood Glucose Monitoring Suppl (ONE TOUCH ULTRA 2) w/Device KIT, Use as directed to check blood sugars 2 times per day dx: e11.22, Disp: 1 kit, Rfl: 0 .  cholecalciferol (VITAMIN D) 1000 units tablet, Take 2,000 Units by mouth daily. , Disp: , Rfl:  .  ferrous sulfate 325 (65 FE) MG tablet, TAKE 1 TABLET BY MOUTH DAILY, Disp: , Rfl:  .  furosemide (LASIX) 80 MG tablet, TAKE 1 TABLET BY MOUTH IN  THE MORNING AND ONE-HALF  TABLET BY MOUTH IN THE  EARLY EVENING, Disp: 135 tablet, Rfl: 3 .  glucose blood (ONETOUCH ULTRA) test strip, USE STRIPS TO TEST BLOOD  SUGAR 3 TIMES DAILY 1 STRIP PER TEST, Disp: 300 strip, Rfl: 3 .  hydrALAZINE (APRESOLINE) 25 MG tablet, TAKE 1 TABLET BY MOUTH 3  TIMES DAILY, Disp: 270 tablet, Rfl: 3 .  Insulin Degludec (TRESIBA FLEXTOUCH) 200 UNIT/ML SOPN, Inject 23 Units into the skin at bedtime. , Disp: , Rfl:  .  Lancets (ONETOUCH DELICA PLUS [id number]) MISC, 100 each by Does not apply route in the morning, at noon, and at bedtime., Disp: 300 each, Rfl: 3 .  LUMIGAN 0.01 % SOLN, Place 1 drop into both eyes 2 (two) times daily. , Disp: , Rfl:  .  Multiple Vitamin (MULTIVITAMIN WITH MINERALS) TABS tablet, Take 1 tablet  by mouth daily., Disp: , Rfl:  .  polyethylene glycol powder (GLYCOLAX/MIRALAX) 17 GM/SCOOP powder, Take 17 g by mouth daily. , Disp: , Rfl:  .  pravastatin (PRAVACHOL) 40 MG tablet, TAKE 1 TABLET BY MOUTH  DAILY, Disp: 90 tablet, Rfl: 3 .  pregabalin (LYRICA) 25 MG capsule, Take 1 capsule (25 mg total) by mouth at bedtime as needed., Disp: 90 capsule, Rfl: 1 .  Semaglutide,0.25 or 0.5MG/DOS, (OZEMPIC, 0.25 OR 0.5 MG/DOSE,) 2 MG/1.5ML SOPN, Inject 0.5 mg into the skin once a week., Disp: 1.5 mL, Rfl: 1 .  tamsulosin (FLOMAX) 0.4 MG CAPS capsule, Take 0.4 mg by mouth daily., Disp: , Rfl:    Allergies  Allergen Reactions  . Gabapentin Itching     Review of Systems   Constitutional: Negative.   Respiratory: Negative.   Cardiovascular: Negative.   Gastrointestinal: Negative.   Genitourinary:       Has h/o ED. Wants to resume meds. He has been on Viagra in the past.   Neurological: Negative.      Today's Vitals   10/29/19 1417  BP: 110/60  Pulse: 96  Temp: 98.1 F (36.7 C)  TempSrc: Oral  Weight: 221 lb 6.4 oz (100.4 kg)  Height: _0  (1.651 m)   Body mass index is 36.84 kg/m.   Objective:  Physical Exam Vitals and nursing note reviewed.  Constitutional:      Appearance: Normal appearance.  Cardiovascular:     Rate and Rhythm: Normal rate and regular rhythm.     Pulses:          Dorsalis pedis pulses are 0 on the right side and 0 on the left side.     Heart sounds: Normal heart sounds.  Pulmonary:     Effort: Pulmonary effort is normal.     Breath sounds: Normal breath sounds.  Feet:     Right foot:     Protective Sensation: 5 sites tested. 5 sites sensed.     Skin integrity: Dry skin present.     Toenail Condition: Right toenails are long.     Left foot:     Protective Sensation: 5 sites tested. 5 sites sensed.     Skin integrity: Callus and dry skin present.     Toenail Condition: Left toenails are long.  Skin:    General: Skin is warm.  Neurological:     General: No focal deficit present.     Mental Status: He is alert.  Psychiatric:        Mood and Affect: Mood normal.         Assessment And Plan:     1. Type 2 diabetes mellitus with stage 4 chronic kidney disease, with long-term current use of insulin (HCC) Comments: Diabetic foot exam was performed. I DISCUSSED WITH THE PATIENT AT LENGTH REGARDING THE GOALS OF GLYCEMIC CONTROL AND POSSIBLE LONG-TERM COMPLICATIONS.  I  ALSO STRESSED THE IMPORTANCE OF COMPLIANCE WITH HOME GLUCOSE MONITORING, DIETARY RESTRICTIONS INCLUDING AVOIDANCE OF SUGARY DRINKS/PROCESSED FOODS,  ALONG WITH REGULAR EXERCISE.  I  ALSO STRESSED THE IMPORTANCE OF ANNUAL EYE EXAMS, SELF FOOT CARE AND  COMPLIANCE WITH OFFICE VISITS.   2. Hypertensive nephropathy Comments: Chronic, well controlled. He will continue with current meds. He is encouraged to follow a low sodium diet.   3. Erectile dysfunction due to arterial insufficiency Comments: I will send rx sildenafil to the pharmacy.   4. Secondary renal hyperparathyroidism ([place]) Comments: Chronic. This is managed by Renal.   5. Class  2 severe obesity due to excess calories with serious comorbidity and body mass index (BMI) of 36.0 to 36.9 in adult [facility]) Comments: Encouraged to strive for BMI less than 30 to decrease cardiac risk. He is encouraged to incorporate more exercise into his daily routine.   Wt Readings from Last 3 Encounters:  10/29/19 221 lb 6.4 oz (100.4 kg)  09/23/19 223 lb 3.2 oz (101.2 kg)  09/10/19 223 lb (101.2 kg)      Patient was given opportunity to ask questions. Patient verbalized understanding of the plan and was able to repeat key elements of the plan. All questions were answered to their satisfaction.  [name], MD   I, [name], MD, have reviewed all documentation for this visit. The documentation on 10/29/19 for the exam, diagnosis, procedures, and orders are all accurate and complete.  THE PATIENT IS ENCOURAGED TO PRACTICE SOCIAL DISTANCING DUE TO THE COVID-19 PANDEMIC.

## 2019-10-29 NOTE — Patient Instructions (Signed)
Diabetes Mellitus and Foot Care Foot care is an important part of your health, especially when you have diabetes. Diabetes may cause you to have problems because of poor blood flow (circulation) to your feet and legs, which can cause your skin to:  Become thinner and drier.  Break more easily.  Heal more slowly.  Peel and crack. You may also have nerve damage (neuropathy) in your legs and feet, causing decreased feeling in them. This means that you may not notice minor injuries to your feet that could lead to more serious problems. Noticing and addressing any potential problems early is the best way to prevent future foot problems. How to care for your feet Foot hygiene  Wash your feet daily with warm water and mild soap. Do not use hot water. Then, pat your feet and the areas between your toes until they are completely dry. Do not soak your feet as this can dry your skin.  Trim your toenails straight across. Do not dig under them or around the cuticle. File the edges of your nails with an emery board or nail file.  Apply a moisturizing lotion or petroleum jelly to the skin on your feet and to dry, brittle toenails. Use lotion that does not contain alcohol and is unscented. Do not apply lotion between your toes. Shoes and socks  Wear clean socks or stockings every day. Make sure they are not too tight. Do not wear knee-high stockings since they may decrease blood flow to your legs.  Wear shoes that fit properly and have enough cushioning. Always look in your shoes before you put them on to be sure there are no objects inside.  To break in new shoes, wear them for just a few hours a day. This prevents injuries on your feet. Wounds, scrapes, corns, and calluses  Check your feet daily for blisters, cuts, bruises, sores, and redness. If you cannot see the bottom of your feet, use a mirror or ask someone for help.  Do not cut corns or calluses or try to remove them with medicine.  If you  find a minor scrape, cut, or break in the skin on your feet, keep it and the skin around it clean and dry. You may clean these areas with mild soap and water. Do not clean the area with peroxide, alcohol, or iodine.  If you have a wound, scrape, corn, or callus on your foot, look at it several times a day to make sure it is healing and not infected. Check for: ? Redness, swelling, or pain. ? Fluid or blood. ? Warmth. ? Pus or a bad smell. General instructions  Do not cross your legs. This may decrease blood flow to your feet.  Do not use heating pads or hot water bottles on your feet. They may burn your skin. If you have lost feeling in your feet or legs, you may not know this is happening until it is too late.  Protect your feet from hot and cold by wearing shoes, such as at the beach or on hot pavement.  Schedule a complete foot exam at least once a year (annually) or more often if you have foot problems. If you have foot problems, report any cuts, sores, or bruises to your health care provider immediately. Contact a health care provider if:  You have a medical condition that increases your risk of infection and you have any cuts, sores, or bruises on your feet.  You have an injury that is not   healing.  You have redness on your legs or feet.  You feel burning or tingling in your legs or feet.  You have pain or cramps in your legs and feet.  Your legs or feet are numb.  Your feet always feel cold.  You have pain around a toenail. Get help right away if:  You have a wound, scrape, corn, or callus on your foot and: ? You have pain, swelling, or redness that gets worse. ? You have fluid or blood coming from the wound, scrape, corn, or callus. ? Your wound, scrape, corn, or callus feels warm to the touch. ? You have pus or a bad smell coming from the wound, scrape, corn, or callus. ? You have a fever. ? You have a red line going up your leg. Summary  Check your feet every day  for cuts, sores, red spots, swelling, and blisters.  Moisturize feet and legs daily.  Wear shoes that fit properly and have enough cushioning.  If you have foot problems, report any cuts, sores, or bruises to your health care provider immediately.  Schedule a complete foot exam at least once a year (annually) or more often if you have foot problems. This information is not intended to replace advice given to you by your health care provider. Make sure you discuss any questions you have with your health care provider. Document Revised: 09/18/2018 Document Reviewed: 01/28/2016 Elsevier Patient Education  2020 [employer]  

## 2019-11-08 ENCOUNTER — Ambulatory Visit: Payer: Medicare Other | Attending: Internal Medicine

## 2019-11-08 DIAGNOSIS — Z23 Encounter for immunization: Secondary | ICD-10-CM

## 2019-11-08 NOTE — Progress Notes (Signed)
   Covid-19 Vaccination Clinic  Name:  NISHANT SCHRECENGOST    MRN: [id number] DOB: July 10, 1929  11/08/2019  Mr. Schaller was observed post Covid-19 immunization for 15 minutes without incident. He was provided with Vaccine Information Sheet and instruction to access the V-Safe system.   Mr. Molner was instructed to call 911 with any severe reactions post vaccine: [name] Difficulty breathing  . Swelling of face and throat  . A fast heartbeat  . A bad rash all over body  . Dizziness and weakness

## 2019-11-17 ENCOUNTER — Telehealth: Payer: Medicare Other

## 2019-11-17 ENCOUNTER — Other Ambulatory Visit: Payer: Self-pay

## 2019-11-17 ENCOUNTER — Ambulatory Visit: Payer: Self-pay

## 2019-11-17 DIAGNOSIS — E785 Hyperlipidemia, unspecified: Secondary | ICD-10-CM

## 2019-11-17 DIAGNOSIS — I129 Hypertensive chronic kidney disease with stage 1 through stage 4 chronic kidney disease, or unspecified chronic kidney disease: Secondary | ICD-10-CM

## 2019-11-17 DIAGNOSIS — E1141 Type 2 diabetes mellitus with diabetic mononeuropathy: Secondary | ICD-10-CM

## 2019-11-17 DIAGNOSIS — E559 Vitamin D deficiency, unspecified: Secondary | ICD-10-CM

## 2019-11-17 DIAGNOSIS — N184 Chronic kidney disease, stage 4 (severe): Secondary | ICD-10-CM

## 2019-11-17 DIAGNOSIS — E1122 Type 2 diabetes mellitus with diabetic chronic kidney disease: Secondary | ICD-10-CM

## 2019-11-17 LAB — COMPREHENSIVE METABOLIC PANEL
Albumin: 4.1 (ref 3.5–5.0)
Calcium: 9.2 (ref 8.7–10.7)
GFR calc Af Amer: 27
GFR calc non Af Amer: 23

## 2019-11-17 LAB — BASIC METABOLIC PANEL
BUN: 48 — AB (ref 4–21)
CO2: 24 — AB (ref 13–22)
Chloride: 103 (ref 99–108)
Creatinine: 2.4 — AB (ref 0.6–1.3)
Glucose: 94
Potassium: 4.2 (ref 3.4–5.3)
Sodium: 136 — AB (ref 137–147)

## 2019-11-18 NOTE — Chronic Care Management (AMB) (Signed)
Chronic Care Management   Follow Up Note   11/17/2019 Name: Justin Holloway MRN: [id number] DOB: 27-May-1929  Referred by: [name], MD Reason for referral : Chronic Care Management (CCM RNCM FU Call )   Justin Holloway is a 86 y.o. year old male who is a primary care patient of [name], MD. The CCM team was consulted for assistance with chronic disease management and care coordination needs.    Review of patient status, including review of consultants reports, relevant laboratory and other test results, and collaboration with appropriate care team members and the patient's provider was performed as part of comprehensive patient evaluation and provision of chronic care management services.    SDOH (Social Determinants of Health) assessments performed: Yes - no acute challenges identified  See Care Plan activities for detailed interventions related to [place])  Placed outbound CCM RN CM follow up call to patient for a care plan update.      Outpatient Encounter Medications as of 11/17/2019  Medication Sig  . Ascorbic Acid (VITAMIN C) 1000 MG tablet Take 1,000 mg by mouth daily.   [name] aspirin EC 81 MG tablet Take 81 mg by mouth every evening.  . Blood Glucose Monitoring Suppl (ONE TOUCH ULTRA 2) w/Device KIT Use as directed to check blood sugars 2 times per day dx: e11.22  . cholecalciferol (VITAMIN D) 1000 units tablet Take 2,000 Units by mouth daily.   . ferrous sulfate 325 (65 FE) MG tablet TAKE 1 TABLET BY MOUTH DAILY  . furosemide (LASIX) 80 MG tablet TAKE 1 TABLET BY MOUTH IN  THE MORNING AND ONE-HALF  TABLET BY MOUTH IN THE  EARLY EVENING  . glucose blood (ONETOUCH ULTRA) test strip USE STRIPS TO TEST BLOOD  SUGAR 3 TIMES DAILY 1 STRIP PER TEST  . hydrALAZINE (APRESOLINE) 25 MG tablet TAKE 1 TABLET BY MOUTH 3  TIMES DAILY  . Insulin Degludec (TRESIBA FLEXTOUCH) 200 UNIT/ML SOPN Inject 23 Units into the skin at bedtime.   . Lancets (ONETOUCH DELICA PLUS [id number]) MISC  100 each by Does not apply route in the morning, at noon, and at bedtime.  [name] LUMIGAN 0.01 % SOLN Place 1 drop into both eyes 2 (two) times daily.   . Multiple Vitamin (MULTIVITAMIN WITH MINERALS) TABS tablet Take 1 tablet by mouth daily.  . polyethylene glycol powder (GLYCOLAX/MIRALAX) 17 GM/SCOOP powder Take 17 g by mouth daily.   . pravastatin (PRAVACHOL) 40 MG tablet TAKE 1 TABLET BY MOUTH  DAILY  . pregabalin (LYRICA) 25 MG capsule Take 1 capsule (25 mg total) by mouth at bedtime as needed.  . Semaglutide,0.25 or 0.5MG/DOS, (OZEMPIC, 0.25 OR 0.5 MG/DOSE,) 2 MG/1.5ML SOPN Inject 0.5 mg into the skin once a week.  . tamsulosin (FLOMAX) 0.4 MG CAPS capsule Take 0.4 mg by mouth daily.   No facility-administered encounter medications on file as of 11/17/2019.     Objective:  BP Readings from Last 3 Encounters:  10/29/19 110/60  09/23/19 126/64  09/10/19 136/68   Lab Results  Component Value Date   HGBA1C 6.8 (H) 09/10/2019   HGBA1C 6.7 (H) 06/10/2019   HGBA1C 7.0 (H) 02/26/2019   Lab Results  Component Value Date   MICROALBUR 80 05/28/2019   LDLCALC 104 (H) 09/10/2019   CREATININE 2.85 (H) 09/10/2019     Goals Addressed      Patient Stated   .  "I know my kidney function is not good" (pt-stated)   On track  Current Barriers:  [name] Knowledge Deficits related to disease process and treatment mangement for renal insufficiency/CKD . Chronic Disease Management support and education needs related to DM, CKD Stage 4, Hypertensive Nephropathy  Nurse Case Manager Clinical Goal(s):  . New 06/03/19 Over the next 90 days, patient will verbalize increased understanding and knowledge about what foods to eat with having stage IV CKD and type II Diabetes Mellitus Goal Met  . New 11/18/19 Over the next 90 days, patient will continue to work with the CCM team and PCP for disease education and support for improved Self Health management of stage 4 CKD  CCM RN CM Interventions:  11/18/19:  Completed call with patient . Evaluation of current treatment plan related to stage IV CKD and patient's adherence to plan as established by provider . Educated patient on current GFR <22; Re-educated on stages of chronic kidney disease; Determined patient is established with [facility] with next f/u visit scheduled on 11/22/19 . Re-educated and reviewed disease progression prevention and how to maintain adequate renal function  . Reviewed and discussed patient's elevated PTH and Phosphorus level; Educated patient on disease process and potential etiology and treatment for abnormal labs . Determined patient will review and discuss this further with his Nephrologist at next f/u visit on 11/22/19 . Confirmed patient received printed educational materials related to; "6 Ways to be Water Wise", "Diabetes and Kidney Disease: What to Eat?", "The Best Salt Substitute for Kidney Patients", he denies questions . Discussed plans with patient for ongoing care management follow up and provided patient with direct contact information for care management team  Patient Self Care Activities:  . Self administers medications as prescribed . Attends all scheduled provider appointments . Calls pharmacy for medication refills . Calls provider office for new concerns or questions . Supportive wife to assist with care needs  Please see past updates related to this goal by clicking on the "Past Updates" button in the selected goal     .  "I need to get my A1C under 7" (pt-stated)   On track     Current Barriers:  [name] Knowledge Deficits related to disease process and Self Health management of Diabetes . Chronic Disease Management support and education needs related to DM, CKD Stage 4, Hypertensive Nephropathy  Nurse Case Manager Clinical Goal(s):  . New 11/17/19 Over the next 90 days, patient will continue to work with the CCM team and PCP for disease education and support for improved Self Health Management of  Diabetes Mellitus   CCM RN CM Interventions:  11/17/19 completed call with patient  . Evaluation of adherence to current treatment plan related to Diabetes and patient's adherence to plan as established by provider . Determined patient's current A1c is down to 6.8%; Re-educated on target A1c; Re-educated on daily glycemic control, FBS 80-130, after meals <180; Re-educated on dietary and exercise recommenations . Reviewed medications with patient, he reports adherence with taking Tresiba 200 units/ml, inject 25 units q hs, Semaglutide inject 0.5 mg once a week . Reinforced importance of adhering to a diabetic friendly diet, implementing exercise into his daily routine and taking his medications exactly as prescribed w/o missing doses  . Mailed printed educational materials related to Diabetes Care Schedule to patient's home for review at next f/u call  . Discussed plans with patient for ongoing care management follow up and provided patient with direct contact information for care management team  Patient Self Care Activities:  . Self administers medications as prescribed .  Attends all scheduled provider appointments . Calls pharmacy for medication refills . Calls provider office for new concerns or questions . Supportive wife to assist with care needs   Please see past updates related to this goal by clicking on the "Past Updates" button in the selected goal     .  "to get my Vitamin D level up" (pt-stated)        [place] (see longitudinal plan of care for additional care plan information)  Current Barriers:  [name] Knowledge Deficits related to disease process and Self Health management of Vitamin D deficiency  . Chronic Disease Management support and education needs related to DM, CKD Stage 4, Hypertensive Nephropathy, Diabetic mononeuropathy associated with type 2 Diabetes, Vitamin D deficiency, HLD  Nurse Case Manager Clinical Goal(s):  [name] Over the next 90 days, patient will work  with the CCM team and PCP to address needs related to disease education and support for improved Self Health management of Vitamin D deficiency  CCM RN CM Interventions:  11/17/19 call completed with patient  . Inter-disciplinary care team collaboration (see longitudinal plan of care) . Evaluation of current treatment plan related to Vitamin D deficiency and patient's adherence to plan as established by provider. . Provided education to patient re: disease process related to Vitamin D deficiency; Educated on current Vitamin D level of 34.1; Educated on target range for optimal health; Educated on dietary recommendations  . Reviewed medications with patient and discussed patient is currently taking Vitamin D3 2000 iu daily  . Collaborated with PCP Dr. Baird [facility]  regarding patient's current Vitamin D level is noted to be 34.1; advised, patient is taking Vitamin D3 2000iu daily . Discussed plans with patient for ongoing care management follow up and provided patient with direct contact information for care management team . Provided patient with printed educational materials related to What is Vitamin D?; FAQ about Vitamin D   Patient Self Care Activities:  . Self administers medications as prescribed . Attends all scheduled provider appointments . Calls pharmacy for medication refills . Calls provider office for new concerns or questions . Support wife to assist with care needs   Initial goal documentation     .  "to lower my LDL" (pt-stated)        CARE PLAN ENTRY (see longitudinal plan of care for additional care plan information)  Current Barriers:  [name] Knowledge Deficits related to disease process and Self Health management of Hyperlipidemia  . Chronic Disease Management support and education needs related to DM, CKD Stage 4, Hypertensive Nephropathy, Diabetic mononeuropathy associated with type 2 Diabetes, Vitamin D deficiency, HLD  Nurse Case Manager Clinical Goal(s):  [name] Over the next  90 days, patient will work with the CCM team and PCP to address needs related to disease education and support for improved Self Health management of Hyperlipidemia   CCM RN CM Interventions:  11/17/19 call completed with patient  . Inter-disciplinary care team collaboration (see longitudinal plan of care) . Evaluation of current treatment plan related to HLD and patient's adherence to plan as established by provider. . Provided education to patient re: dietary and exercise recommendations; Educated patient on current LDL 104; Educated on target LDL <70 due to having diabetes . Reviewed medications with patient and discussed patient is adhering to taking Pravastatin 40 mg qd as directed w/o missed doses, no noted SE  . Discussed plans with patient for ongoing care management follow up and provided patient with direct contact information for  care management team . Provided patient with printed educational materials related to 13 Cholesterol Lowering Foods   Patient Self Care Activities:  . Self administers medications as prescribed . Attends all scheduled provider appointments . Calls pharmacy for medication refills . Calls provider office for new concerns or questions  Initial goal documentation       Plan:   Telephone follow up appointment with care management team member scheduled for: 01/15/20  Barb Merino, RN, BSN, CCM Care Management Coordinator [facility]  Direct Phone: [phone]

## 2019-11-18 NOTE — Patient Instructions (Signed)
Visit Information  Goals Addressed      Patient Stated   .  "I know my kidney function is not good" (pt-stated)   On track     Current Barriers:  [name] Knowledge Deficits related to disease process and treatment mangement for renal insufficiency/CKD . Chronic Disease Management support and education needs related to DM, CKD Stage 4, Hypertensive Nephropathy  Nurse Case Manager Clinical Goal(s):  . New 06/03/19 Over the next 90 days, patient will verbalize increased understanding and knowledge about what foods to eat with having stage IV CKD and type II Diabetes Mellitus Goal Met  . New 11/18/19 Over the next 90 days, patient will continue to work with the CCM team and PCP for disease education and support for improved Self Health management of stage 4 CKD  CCM RN CM Interventions:  11/18/19: Completed call with patient . Evaluation of current treatment plan related to stage IV CKD and patient's adherence to plan as established by provider . Educated patient on current GFR <22; Re-educated on stages of chronic kidney disease; Determined patient is established with [facility] with next f/u visit scheduled on 11/22/19 . Re-educated and reviewed disease progression prevention and how to maintain adequate renal function  . Reviewed and discussed patient's elevated PTH and Phosphorus level; Educated patient on disease process and potential etiology and treatment for abnormal labs . Determined patient will review and discuss this further with his Nephrologist at next f/u visit on 11/22/19 . Confirmed patient received printed educational materials related to; "6 Ways to be Water Wise", "Diabetes and Kidney Disease: What to Eat?", "The Best Salt Substitute for Kidney Patients", he denies questions . Discussed plans with patient for ongoing care management follow up and provided patient with direct contact information for care management team  Patient Self Care Activities:  . Self administers  medications as prescribed . Attends all scheduled provider appointments . Calls pharmacy for medication refills . Calls provider office for new concerns or questions . Supportive wife to assist with care needs  Please see past updates related to this goal by clicking on the "Past Updates" button in the selected goal      .  "I need to get my A1C under 7" (pt-stated)   On track     Current Barriers:  [name] Knowledge Deficits related to disease process and Self Health management of Diabetes . Chronic Disease Management support and education needs related to DM, CKD Stage 4, Hypertensive Nephropathy  Nurse Case Manager Clinical Goal(s):  . New 11/17/19 Over the next 90 days, patient will continue to work with the CCM team and PCP for disease education and support for improved Self Health Management of Diabetes Mellitus   CCM RN CM Interventions:  11/17/19 completed call with patient  . Evaluation of adherence to current treatment plan related to Diabetes and patient's adherence to plan as established by provider . Determined patient's current A1c is down to 6.8%; Re-educated on target A1c; Re-educated on daily glycemic control, FBS 80-130, after meals <180; Re-educated on dietary and exercise recommenations . Reviewed medications with patient, he reports adherence with taking Tresiba 200 units/ml, inject 25 units q hs, Semaglutide inject 0.5 mg once a week . Reinforced importance of adhering to a diabetic friendly diet, implementing exercise into his daily routine and taking his medications exactly as prescribed w/o missing doses  . Mailed printed educational materials related to Diabetes Care Schedule to patient's home for review at next f/u call  . Discussed  plans with patient for ongoing care management follow up and provided patient with direct contact information for care management team  Patient Self Care Activities:  . Self administers medications as prescribed . Attends all scheduled  provider appointments . Calls pharmacy for medication refills . Calls provider office for new concerns or questions . Supportive wife to assist with care needs   Please see past updates related to this goal by clicking on the "Past Updates" button in the selected goal       .  "to get my Vitamin D level up" (pt-stated)        CARE PLAN ENTRY (see longitudinal plan of care for additional care plan information)  Current Barriers:  . Knowledge Deficits related to disease process and Self Health management of Vitamin D deficiency  . Chronic Disease Management support and education needs related to DM, CKD Stage 4, Hypertensive Nephropathy, Diabetic mononeuropathy associated with type 2 Diabetes, Vitamin D deficiency, HLD  Nurse Case Manager Clinical Goal(s):  . Over the next 90 days, patient will work with the CCM team and PCP to address needs related to disease education and support for improved Self Health management of Vitamin D deficiency  CCM RN CM Interventions:  11/17/19 call completed with patient  . Inter-disciplinary care team collaboration (see longitudinal plan of care) . Evaluation of current treatment plan related to Vitamin D deficiency and patient's adherence to plan as established by provider. . Provided education to patient re: disease process related to Vitamin D deficiency; Educated on current Vitamin D level of 34.1; Educated on target range for optimal health; Educated on dietary recommendations  . Reviewed medications with patient and discussed patient is currently taking Vitamin D3 2000 iu daily  . Collaborated with PCP Dr. Sanders  regarding patient's current Vitamin D level is noted to be 34.1; advised, patient is taking Vitamin D3 2000iu daily . Discussed plans with patient for ongoing care management follow up and provided patient with direct contact information for care management team . Provided patient with printed educational materials related to What is Vitamin  D?; FAQ about Vitamin D   Patient Self Care Activities:  . Self administers medications as prescribed . Attends all scheduled provider appointments . Calls pharmacy for medication refills . Calls provider office for new concerns or questions . Support wife to assist with care needs   Initial goal documentation     .  "to lower my LDL" (pt-stated)        CARE PLAN ENTRY (see longitudinal plan of care for additional care plan information)  Current Barriers:  . Knowledge Deficits related to disease process and Self Health management of Hyperlipidemia  . Chronic Disease Management support and education needs related to DM, CKD Stage 4, Hypertensive Nephropathy, Diabetic mononeuropathy associated with type 2 Diabetes, Vitamin D deficiency, HLD  Nurse Case Manager Clinical Goal(s):  . Over the next 90 days, patient will work with the CCM team and PCP to address needs related to disease education and support for improved Self Health management of Hyperlipidemia   CCM RN CM Interventions:  11/17/19 call completed with patient  . Inter-disciplinary care team collaboration (see longitudinal plan of care) . Evaluation of current treatment plan related to HLD and patient's adherence to plan as established by provider. . Provided education to patient re: dietary and exercise recommendations; Educated patient on current LDL 104; Educated on target LDL <70 due to having diabetes . Reviewed medications with patient and discussed patient is   adhering to taking Pravastatin 40 mg qd as directed w/o missed doses, no noted SE  . Discussed plans with patient for ongoing care management follow up and provided patient with direct contact information for care management team . Provided patient with printed educational materials related to 13 Cholesterol Lowering Foods   Patient Self Care Activities:  . Self administers medications as prescribed . Attends all scheduled provider appointments . Calls pharmacy  for medication refills . Calls provider office for new concerns or questions  Initial goal documentation       Patient verbalizes understanding of instructions provided today.   Telephone follow up appointment with care management team member scheduled for: 01/15/20  Barb Merino, RN, BSN, CCM Care Management Coordinator [facility]  Direct Phone: [phone]

## 2019-11-25 DIAGNOSIS — I129 Hypertensive chronic kidney disease with stage 1 through stage 4 chronic kidney disease, or unspecified chronic kidney disease: Secondary | ICD-10-CM | POA: Diagnosis not present

## 2019-11-25 DIAGNOSIS — N184 Chronic kidney disease, stage 4 (severe): Secondary | ICD-10-CM | POA: Diagnosis not present

## 2019-11-25 DIAGNOSIS — N2581 Secondary hyperparathyroidism of renal origin: Secondary | ICD-10-CM | POA: Diagnosis not present

## 2019-11-25 DIAGNOSIS — D631 Anemia in chronic kidney disease: Secondary | ICD-10-CM | POA: Diagnosis not present

## 2019-11-25 DIAGNOSIS — R609 Edema, unspecified: Secondary | ICD-10-CM | POA: Diagnosis not present

## 2019-11-26 ENCOUNTER — Telehealth: Payer: Self-pay

## 2019-11-26 NOTE — Chronic Care Management (AMB) (Signed)
Chronic Care Management Pharmacy Assistant   Name: Justin Holloway  MRN: [id number] DOB: 1929-12-19  Reason for Encounter: Disease State- Diabetes , Hypertension and Lipid Adherence Call.  Patient Questions:  1.  Have you seen any other providers since your last visit? Yes 10/29/2019 [name], MD - (PCP)     2.  Any changes in your medicines or health? Yes. Patient states he is not taking his Lyrica.   [name]  PCP : [name], MD  Allergies:   Allergies  Allergen Reactions  . Gabapentin Itching    Medications: Outpatient Encounter Medications as of 11/26/2019  Medication Sig  . Ascorbic Acid (VITAMIN C) 1000 MG tablet Take 1,000 mg by mouth daily.   [name] aspirin EC 81 MG tablet Take 81 mg by mouth every evening.  . Blood Glucose Monitoring Suppl (ONE TOUCH ULTRA 2) w/Device KIT Use as directed to check blood sugars 2 times per day dx: e11.22  . cholecalciferol (VITAMIN D) 1000 units tablet Take 2,000 Units by mouth daily.   . ferrous sulfate 325 (65 FE) MG tablet TAKE 1 TABLET BY MOUTH DAILY  . furosemide (LASIX) 80 MG tablet TAKE 1 TABLET BY MOUTH IN  THE MORNING AND ONE-HALF  TABLET BY MOUTH IN THE  EARLY EVENING  . glucose blood (ONETOUCH ULTRA) test strip USE STRIPS TO TEST BLOOD  SUGAR 3 TIMES DAILY 1 STRIP PER TEST  . hydrALAZINE (APRESOLINE) 25 MG tablet TAKE 1 TABLET BY MOUTH 3  TIMES DAILY  . Insulin Degludec (TRESIBA FLEXTOUCH) 200 UNIT/ML SOPN Inject 23 Units into the skin at bedtime.   . Lancets (ONETOUCH DELICA PLUS [id number]) MISC 100 each by Does not apply route in the morning, at noon, and at bedtime.  [name] LUMIGAN 0.01 % SOLN Place 1 drop into both eyes 2 (two) times daily.   . Multiple Vitamin (MULTIVITAMIN WITH MINERALS) TABS tablet Take 1 tablet by mouth daily.  . polyethylene glycol powder (GLYCOLAX/MIRALAX) 17 GM/SCOOP powder Take 17 g by mouth daily.   . pravastatin (PRAVACHOL) 40 MG tablet TAKE 1 TABLET BY MOUTH  DAILY  . pregabalin (LYRICA) 25  MG capsule Take 1 capsule (25 mg total) by mouth at bedtime as needed.  . Semaglutide,0.25 or 0.5MG/DOS, (OZEMPIC, 0.25 OR 0.5 MG/DOSE,) 2 MG/1.5ML SOPN Inject 0.5 mg into the skin once a week.  . tamsulosin (FLOMAX) 0.4 MG CAPS capsule Take 0.4 mg by mouth daily.   No facility-administered encounter medications on file as of 11/26/2019.    Current Diagnosis: Patient Active Problem List   Diagnosis Date Noted  . Type 2 diabetes mellitus with stage 4 chronic kidney disease, with long-term current use of insulin ([name]) 09/10/2019  . Peripheral vascular disease, unspecified ([place]) 05/28/2019  . Diabetic neuropathy ([place]) 02/18/2019  . Left upper quadrant pain 09/04/2018  . Acute midline low back pain without sciatica 09/04/2018  . CKD (chronic kidney disease) stage 4, GFR 15-29 ml/min (HCC) 11/21/2017  . Hypertensive nephropathy 11/21/2017  . Right foot pain 11/21/2017  . Vertigo 09/09/2017  . BPPV (benign paroxysmal positional vertigo), right 09/06/2017  . Sensorineural hearing loss (SNHL), bilateral 09/06/2017  . Ataxia 02/06/2017  . UTI (lower urinary tract infection) 08/07/2015  . Dizziness 08/07/2015  . Acute renal failure superimposed on stage 3 chronic kidney disease ([place]) 08/07/2015  . Hyperkalemia 08/07/2015  . Left inguinal hernia 05/15/2013  . Diabetes mellitus without complication ([place]) 99/37/1696  . HLD (hyperlipidemia) 03/08/2006  . HYPERTENSION, BENIGN SYSTEMIC 03/08/2006  .  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 PROTEINURIA 03/08/2006    Recent Relevant Labs: Lab Results  Component Value Date/Time   HGBA1C 6.8 (H) 09/10/2019 10:29 AM   HGBA1C 6.7 (H) 06/10/2019 10:00 AM   MICROALBUR 80 05/28/2019 12:29 PM   MICROALBUR 80 12/26/2017 04:58 PM    Kidney Function Lab Results  Component Value Date/Time   CREATININE 2.85 (H) 09/10/2019 10:29 AM   CREATININE 3.16 (H) 05/21/2019 02:00 PM   GFRNONAA 19 (L) 09/10/2019 10:29 AM   GFRAA 22 (L) 09/10/2019 10:29 AM     . Current antihyperglycemic regimen:  o Tresiba 24 units nightly o Ozempic 0.26m weekly  . What recent interventions/DTPs have been made to improve glycemic control:  o Patient states he is taking his medications as directed by provider.  . Have there been any recent hospitalizations or ED visits since last visit with CPP?  .Marland KitchenPatient denies hypoglycemic symptoms, including Pale, Sweaty, Shaky, Hungry, Nervous/irritable and Vision changes . Patient denies hyperglycemic symptoms, including blurry vision, excessive thirst, fatigue, polyuria and weakness . How often are you checking your blood sugar? Patient checks blood sugars once a day  . What are your blood sugars ranging? Patient states blood sugars run in between 115-120 o Fasting: 11/26/2019 108  o Before meals: none o After meals: none o Bedtime: none . During the week, how often does your blood glucose drop below 70? No  . Are you checking your feet daily/regularly? Yes, Patient states he see's Podiatrist every three months for his nails. Patient denies any open sores, tingling , numbness or any pain. Patient did complain about his neuropathy , states that he is not taking any medication .  Adherence Review: Is the patient currently on a STATIN medication? Yes Is the patient currently on ACE/ARB medication? No Does the patient have >5 day gap between last estimated fill dates?    Reviewed chart prior to disease state call. Spoke with patient regarding BP  Recent Office Vitals: BP Readings from Last 3 Encounters:  10/29/19 110/60  09/23/19 126/64  09/10/19 136/68   Pulse Readings from Last 3 Encounters:  10/29/19 96  09/23/19 93  09/10/19 97    Wt Readings from Last 3 Encounters:  10/29/19 221 lb 6.4 oz (100.4 kg)  09/23/19 223 lb 3.2 oz (101.2 kg)  09/10/19 223 lb (101.2 kg)     Kidney Function Lab Results  Component Value Date/Time   CREATININE 2.85 (H) 09/10/2019 10:29 AM   CREATININE 3.16 (H) 05/21/2019  02:00 PM   GFRNONAA 19 (L) 09/10/2019 10:29 AM   GFRAA 22 (L) 09/10/2019 10:29 AM    BMP Latest Ref Rng & Units 09/10/2019 09/10/2019 05/21/2019  Glucose 65 - 99 mg/dL 86 - 119(H)  BUN 8 - 27 mg/dL 54(H) - 55(H)  Creatinine 0.76 - 1.27 mg/dL 2.85(H) - 3.16(H)  BUN/Creat Ratio 10 - 24 19 - -  Sodium 134 - 144 mmol/L 137 - 141  Potassium 3.5 - 5.2 mmol/L 4.8 - 4.5  Chloride 96 - 106 mmol/L 100 - 104  CO2 20 - 29 mmol/L 21 - 27  Calcium 8.6 - 10.2 mg/dL 9.3 9.4 9.4    . Current antihypertensive regimen:  o Hydralazine 25 mg one tablet three daily o Furosemide 80 mg every morning 40 every evening  . How often are you checking your Blood Pressure? None  . Current home BP readings:None ,[facility] 123/68.  What recent interventions/DTPs have been made by  any provider to improve Blood Pressure control since last CPP Visit: Patient states he is taking his medications as directed by providers.  . Any recent hospitalizations or ED visits since last visit with CPP? No  . What diet changes have been made to improve Blood Pressure Control?  o Patient states he is more healthier and staying away from salt, sugar, and fats.  . What exercise is being done to improve your Blood Pressure Control?  o Patient states he does not have a exercise regiment, but he does walk around house, and he is active for about 15-20 minutes a day.  Adherence Review: Is the patient currently on ACE/ARB medication? No Does the patient have >5 day gap between last estimated fill dates?  Reviewed chart and adherence measures. Per [facility], medication compliance Cholesterol 90-99 %.      Comprehensive medication review performed; Spoke to patient regarding cholesterol  Lipid Panel    Component Value Date/Time   CHOL 173 09/10/2019 1029   TRIG 64 09/10/2019 1029   HDL 57 09/10/2019 1029   LDLCALC 104 (H) 09/10/2019 1029    10-year ASCVD risk score: The ASCVD Risk  score Mikey Bussing DC Jr., et al., 2013) failed to calculate for the following reasons:   The 2013 ASCVD risk score is only valid for ages 60 to 74  . Current antihyperlipidemic regimen:           Pravastatin 12m daily  . Previous antihyperlipidemic medications tried: No  . ASCVD risk enhancing conditions: age >>32 DM and HTN   . What recent interventions/DTPs have been made by any provider to improve Cholesterol control since last CPP Visit: Patient states he is taking his medications as directed by provider.  . Any recent hospitalizations or ED visits since last visit with CPP? No  . What diet changes have been made to improve Cholesterol?   o Patient states that he is eating healthier , staying away from salt, sugars and fats. . What exercise is being done to improve Cholesterol?         Patient states he does not have a exercise regiment, but he does walk around house, and he is active         for about 15-20 minutes a day.  Adherence Review: Does the patient have >5 day gap between last estimated fill dates? Reviewed chart and adherence measures. Per [facility] medication compliance Cholesterol 90-99 %.       Goals Addressed            This Visit's Progress   . Pharmacy Care Plan   On track    CARE PLAN ENTRY (see longitudinal plan of care for additional care plan information)  Current Barriers:  . Chronic Disease Management support, education, and care coordination needs related to Hypertension, Hyperlipidemia, and Diabetes   Hypertension BP Readings from Last 3 Encounters:  05/28/19 118/64  05/28/19 118/64  05/21/19 132/69   . Pharmacist Clinical Goal(s): o Over the next 180 days, patient will work with PharmD and providers to maintain BP goal <130/80 . Current regimen:  o Hydralazine 221mthree times daily o Furosemide 8033mvery morning and 9m76mery evening . Interventions: o Dietary and exercise recommendations provided . Patient self care activities -  Over the next 90 days, patient will: o Check BP if symptomatic, document, and provide at future appointments o Ensure daily salt intake < 2300 mg/day o Try to exercise for 30 minutes 5 times weekly, 150 minutes  per week total  Hyperlipidemia Lab Results  Component Value Date/Time   LDLCALC 102 (H) 02/26/2019 10:38 AM   . Pharmacist Clinical Goal(s): o Over the next 90 days, patient will work with PharmD and providers to achieve LDL goal < 70 . Current regimen:  o Pravastatin 20m daily . Interventions: o Discuss with PCP, changing to atorvastatin 457mdaily due to LDL above goal and kidney function o Provided dietary and exercise recommendations . Patient self care activities - Over the next 90 days, patient will: o Start atorvastatin 405maily, if PCP agrees to switch medication o Try to exercise for 30 minutes 5 times weekly, 150 minutes per week total  Diabetes Lab Results  Component Value Date/Time   HGBA1C 6.7 (H) 06/10/2019 10:00 AM   HGBA1C 7.0 (H) 02/26/2019 10:38 AM   . Pharmacist Clinical Goal(s): o Over the next 180 days, patient will work with PharmD and providers to maintain A1c goal <7% . Current regimen:  o Tresiba 23 units nightly o Ozempic 0.5mg57mekly . Interventions: o Completed and [place]ed [place] Ozempic to [employer] assistance program - Determined patient received sample at last office visit o Will discuss increasing Lyrica to 50mg31mly with PCP for neuropathy o Provided dietary and exercise recommendations . Patient self care activities - Over the next 90 days, patient will: o Check blood sugar once daily, document, and provide at future appointments o Contact provider with any episodes of hypoglycemia o Increase Lyrica to 50mg 47my if PCP agrees o Try to exercise for 30 minutes 5 times weekly, 150 minutes per week total  Medication management . Pharmacist Clinical Goal(s): o Over the next 90 days, patient  will work with PharmD and providers to maintain optimal medication adherence . Current pharmacy: [facility] . Interventions o Comprehensive medication review performed. o Continue current medication management strategy . Patient self care activities - Over the next 90 days, patient will: o Focus on medication adherence by continued use of pill box o Take medications as prescribed o Report any questions or concerns to PharmD and/or provider(s)  Please see past updates related to this goal by clicking on the "Past Updates" button in the selected goal         Follow-Up:  Pharmacist Review - Patient states that he would like to discuss with Dr. SanderBaird Cancer taking something for his Neuropathy.  VallieOrlando PennerNotified.  VeroniJudithann Sheen [facility] Pharmacist Assistant [phone]

## 2019-12-16 ENCOUNTER — Telehealth: Payer: Self-pay

## 2019-12-17 ENCOUNTER — Encounter: Payer: Self-pay | Admitting: Internal Medicine

## 2019-12-23 IMAGING — CT CT ABD-PELV W/O CM
2 of 4 series · 17 of 46 positions shown, 19 images · non-contrast
Comparison: None.

CLINICAL DATA: Abdominal pain and distention.

EXAM:
CT ABDOMEN AND PELVIS WITHOUT CONTRAST
TECHNIQUE: Multidetector CT imaging of the abdomen and pelvis was performed
following the standard protocol without IV contrast.

[Series 3: abd/ pelvis 5.0 i30f 2 · axial · 0.95mm/px · z∈[+829,+1269]mm · 14 of 98 slices shown, 16 images]
[im 5/98  soft-tissue]
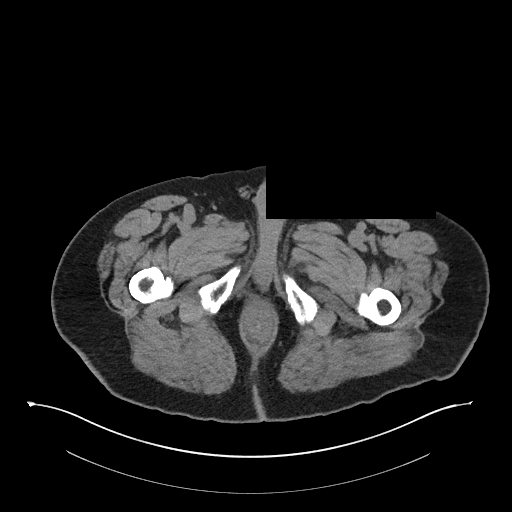
[im 5/98  bone]
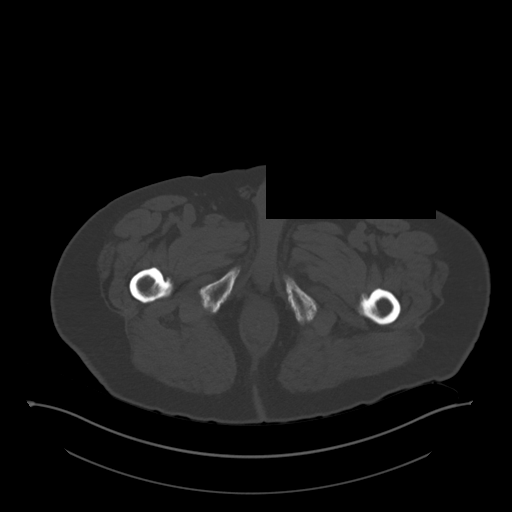
[im 13/98  soft-tissue]
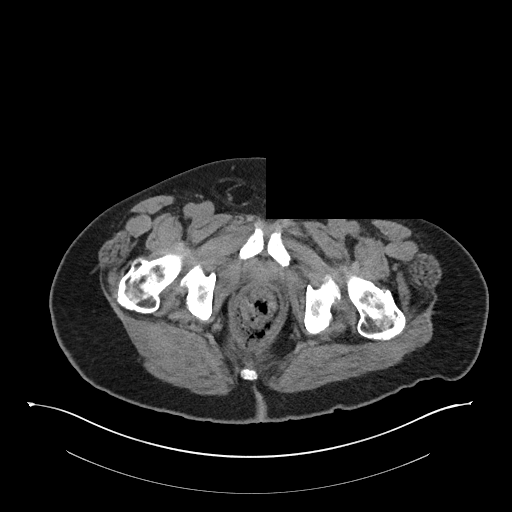
[im 21/98  soft-tissue]
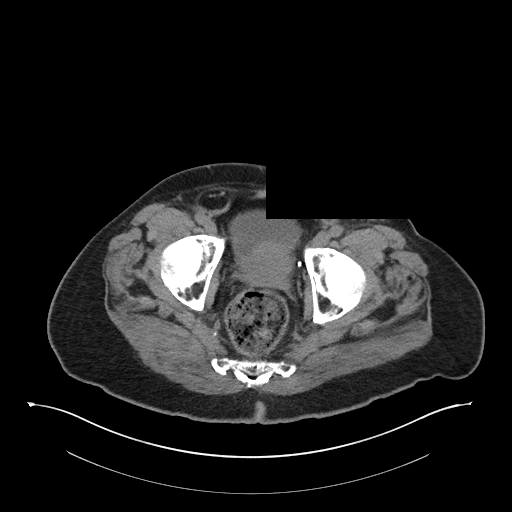
[im 25/98  soft-tissue]
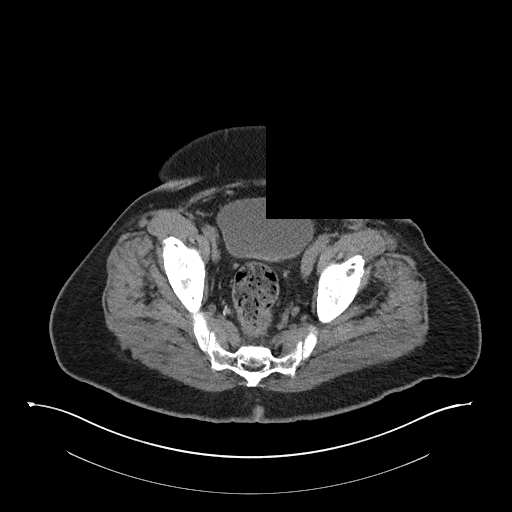
[im 33/98  soft-tissue]
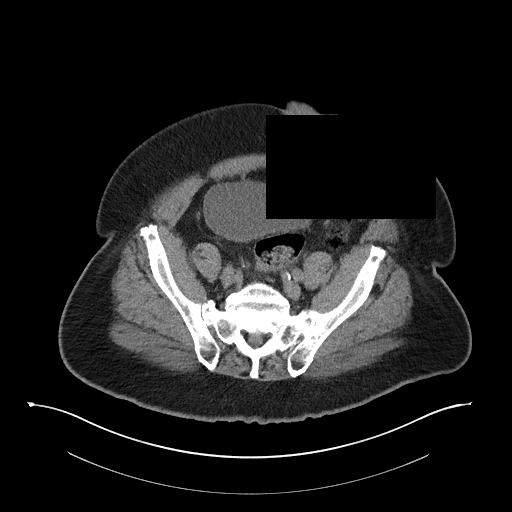
[im 41/98  soft-tissue]
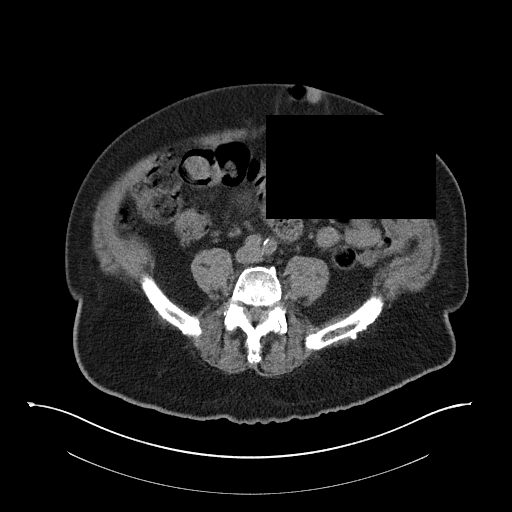
[im 45/98  soft-tissue]
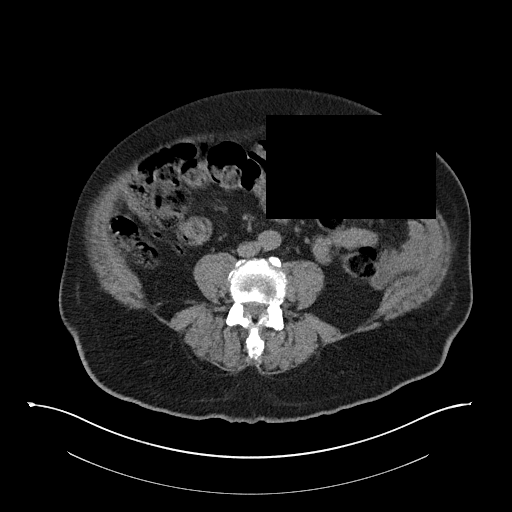
[im 53/98  soft-tissue]
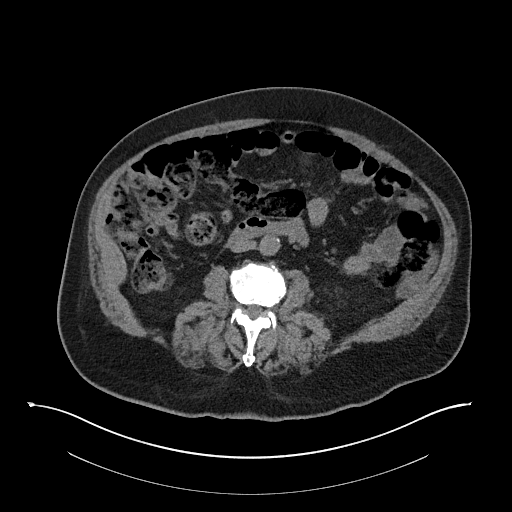
[im 57/98  soft-tissue]
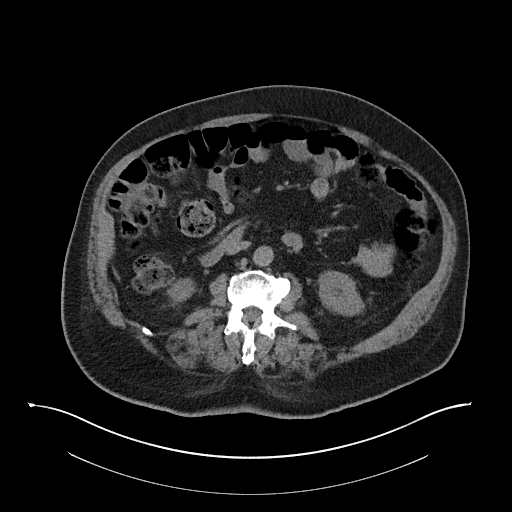
[im 57/98  bone]
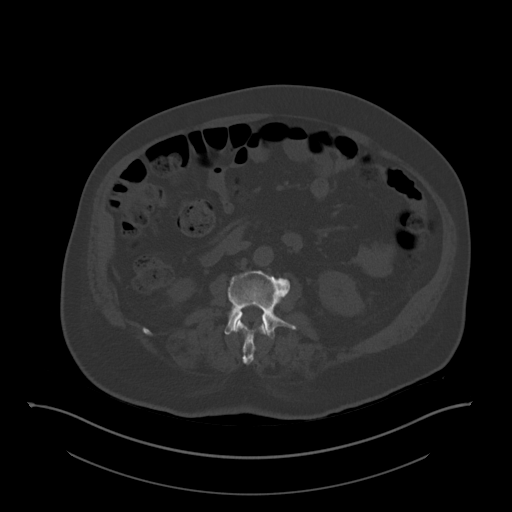
[im 65/98  soft-tissue]
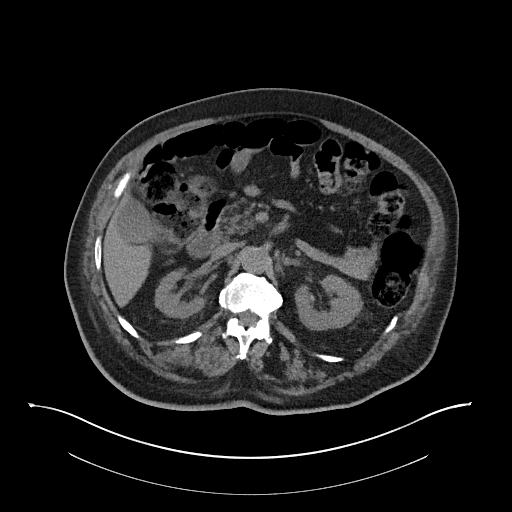
[im 73/98  soft-tissue]
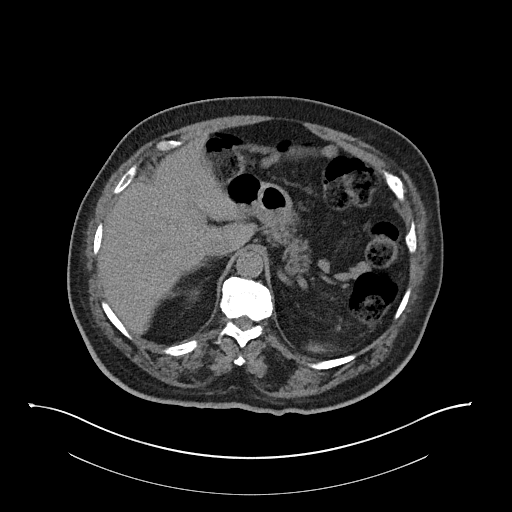
[im 77/98  soft-tissue]
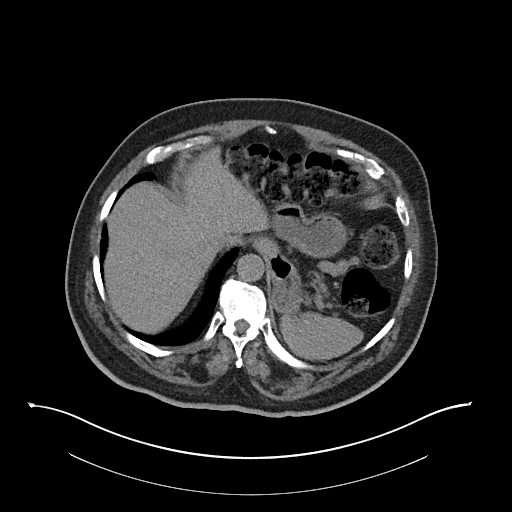
[im 85/98  soft-tissue]
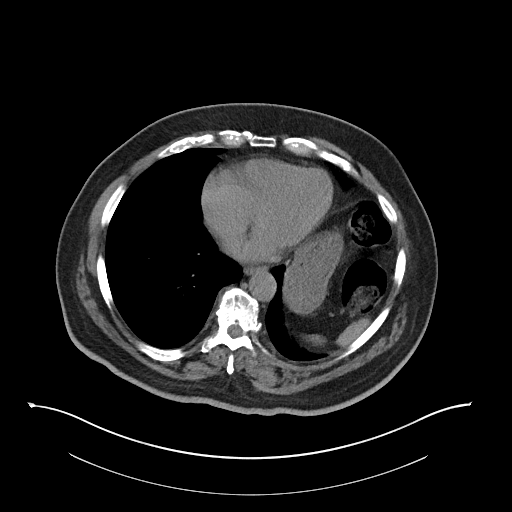
[im 93/98  soft-tissue]
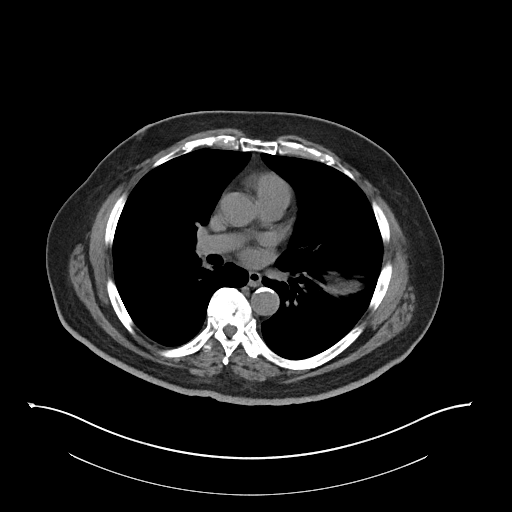

[Series 6: cor st · coronal · 0.89mm/px · 3 of 114 slices shown]
[im 38/114  soft-tissue]
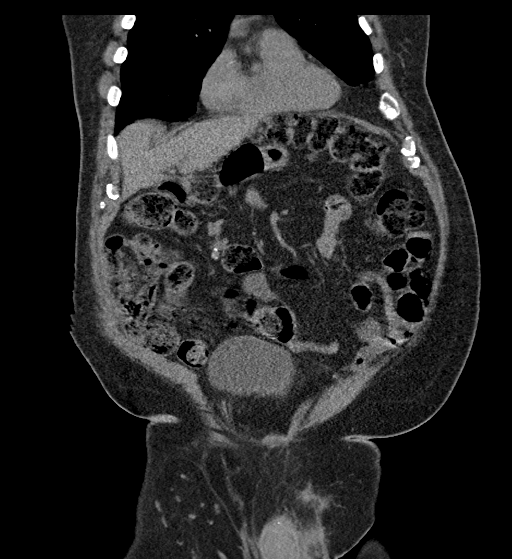
[im 51/114  soft-tissue]
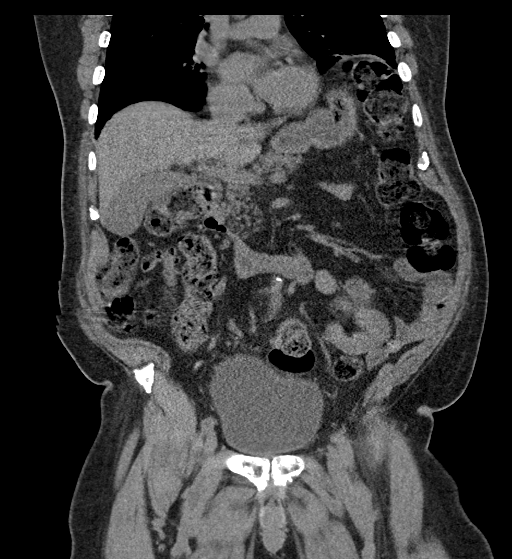
[im 63/114  soft-tissue]
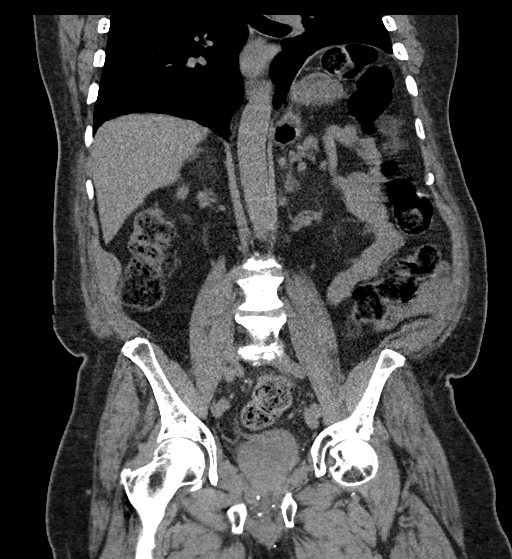

[17 of 46 positions shown; findings below may reference images not displayed]

FINDINGS: Lower chest: Scattered coronary artery calcifications. Aortic
atherosclerosis. Chronic elevation of the left hemidiaphragm.

Hepatobiliary: No focal liver abnormality is seen. No gallstones,
gallbladder wall thickening, or biliary dilatation.

Pancreas: Unremarkable. No pancreatic ductal dilatation or
surrounding inflammatory changes.

Spleen: Normal in size without focal abnormality.

Adrenals/Urinary Tract: Adrenal glands are unremarkable. Kidneys are
normal, without renal calculi, focal lesion, or hydronephrosis.
Bladder is unremarkable.

Stomach/Bowel: There stool throughout the nondistended colon with a
moderate amount of stool in the rectum without of fecal impaction.
The stomach, small bowel and appendix appear normal.

Vascular/Lymphatic: Slight aortic atherosclerosis. No adenopathy.

Reproductive: Slight prominence of the prostate gland.

Other: There is a umbilical hernia containing fat and a loop of
small bowel. The defect in the anterior abdominal wall is
approximately 6 cm. No evidence of bowel obstruction. Scarring in
the left inguinal region due to prior inguinal hernia surgery.

Musculoskeletal: No acute or significant osseous findings.
IMPRESSION: 1. Moderate amount of stool throughout the nondistended colon with a
moderate amount of stool in the rectum.
2. Umbilical hernia containing fat and a loop of small bowel without
obstruction.

Aortic Atherosclerosis ([id number]).

## 2019-12-25 ENCOUNTER — Telehealth: Payer: Self-pay | Admitting: *Deleted

## 2019-12-25 NOTE — Chronic Care Management (AMB) (Signed)
°  Care Management   Note  12/25/2019 Name: Justin Holloway MRN: [id number] DOB: 12/26/1929  Justin Holloway is a 86 y.o. year old male who is a primary care patient of [name], MD and is actively engaged with the care management team. I reached out to [name] by phone today to assist with re-scheduling a follow up visit with the Pharmacist  Follow up plan: Unsuccessful telephone outreach attempt made. A HIPAA compliant phone message was left for the patient providing contact information and requesting a return call.  The care management team will reach out to the patient again over the next 7 days.  If patient returns call to provider office, please advise to call [place] at [phone].  [place] Management  Direct Dial [phone]

## 2019-12-26 NOTE — Chronic Care Management (AMB) (Signed)
  Care Management   Note  12/26/2019 Name: [name] MRN: [id number] DOB: [birth year]  Justin Holloway is a 86 y.o. year old male who is a primary care patient of [name], MD and is actively engaged with the care management team. I reached out to [name] by phone today to assist with re-scheduling a follow up visit with the RN Case Manager  Follow up plan: Telephone appointment with care management team member scheduled for: 01/29/2020  [place] Management  Direct Dial: [phone]

## 2019-12-30 ENCOUNTER — Telehealth: Payer: Self-pay

## 2019-12-30 NOTE — Chronic Care Management (AMB) (Signed)
Chronic Care Management Pharmacy Assistant   Name: Justin Holloway  MRN: [id number] DOB: 04/04/1929  Reason for Encounter: Medication Review   PCP : [name], MD  Allergies:   Allergies  Allergen Reactions  . Gabapentin Itching    Medications: Outpatient Encounter Medications as of 12/30/2019  Medication Sig  . Ascorbic Acid (VITAMIN C) 1000 MG tablet Take 1,000 mg by mouth daily.   [name] aspirin EC 81 MG tablet Take 81 mg by mouth every evening.  . Blood Glucose Monitoring Suppl (ONE TOUCH ULTRA 2) w/Device KIT Use as directed to check blood sugars 2 times per day dx: e11.22  . cholecalciferol (VITAMIN D) 1000 units tablet Take 2,000 Units by mouth daily.   . ferrous sulfate 325 (65 FE) MG tablet TAKE 1 TABLET BY MOUTH DAILY  . furosemide (LASIX) 80 MG tablet TAKE 1 TABLET BY MOUTH IN  THE MORNING AND ONE-HALF  TABLET BY MOUTH IN THE  EARLY EVENING  . glucose blood (ONETOUCH ULTRA) test strip USE STRIPS TO TEST BLOOD  SUGAR 3 TIMES DAILY 1 STRIP PER TEST  . hydrALAZINE (APRESOLINE) 25 MG tablet TAKE 1 TABLET BY MOUTH 3  TIMES DAILY  . Insulin Degludec (TRESIBA FLEXTOUCH) 200 UNIT/ML SOPN Inject 23 Units into the skin at bedtime.   . Lancets (ONETOUCH DELICA PLUS [id number]) MISC 100 each by Does not apply route in the morning, at noon, and at bedtime.  [name] LUMIGAN 0.01 % SOLN Place 1 drop into both eyes 2 (two) times daily.   . Multiple Vitamin (MULTIVITAMIN WITH MINERALS) TABS tablet Take 1 tablet by mouth daily.  . polyethylene glycol powder (GLYCOLAX/MIRALAX) 17 GM/SCOOP powder Take 17 g by mouth daily.   . pravastatin (PRAVACHOL) 40 MG tablet TAKE 1 TABLET BY MOUTH  DAILY  . pregabalin (LYRICA) 25 MG capsule Take 1 capsule (25 mg total) by mouth at bedtime as needed.  . Semaglutide,0.25 or 0.5MG/DOS, (OZEMPIC, 0.25 OR 0.5 MG/DOSE,) 2 MG/1.5ML SOPN Inject 0.5 mg into the skin once a week.  . tamsulosin (FLOMAX) 0.4 MG CAPS capsule Take 0.4 mg by mouth daily.   No  facility-administered encounter medications on file as of 12/30/2019.    Current Diagnosis: Patient Active Problem List   Diagnosis Date Noted  . Type 2 diabetes mellitus with stage 4 chronic kidney disease, with long-term current use of insulin ([place]) 09/10/2019  . Peripheral vascular disease, unspecified ([place]) 05/28/2019  . Diabetic neuropathy ([place]) 02/18/2019  . Left upper quadrant pain 09/04/2018  . Acute midline low back pain without sciatica 09/04/2018  . CKD (chronic kidney disease) stage 4, GFR 15-29 ml/min (HCC) 11/21/2017  . Hypertensive nephropathy 11/21/2017  . Right foot pain 11/21/2017  . Vertigo 09/09/2017  . BPPV (benign paroxysmal positional vertigo), right 09/06/2017  . Sensorineural hearing loss (SNHL), bilateral 09/06/2017  . Ataxia 02/06/2017  . UTI (lower urinary tract infection) 08/07/2015  . Dizziness 08/07/2015  . Acute renal failure superimposed on stage 3 chronic kidney disease ([place]) 08/07/2015  . Hyperkalemia 08/07/2015  . Left inguinal hernia 05/15/2013  . Diabetes mellitus without complication ([place]) 93/71/6967  . HLD (hyperlipidemia) 03/08/2006  . HYPERTENSION, BENIGN SYSTEMIC 03/08/2006  . 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 PROTEINURIA 03/08/2006      Follow-Up:  Patient Assistance Coordination New patient assistance application filled out to [employer] for [employer]. Waiting for provider and patient's signature and proof of income.  Called patient to see when he can go by PCP office to sign application  and to bring proof of income. Per patient he will be in on 01/06/2020 to bring proof of income and sign.  [name], CPP Notified  Judithann Sheen, [place] Pharmacist Assistant [phone]

## 2020-01-06 ENCOUNTER — Other Ambulatory Visit: Payer: Self-pay

## 2020-01-06 MED ORDER — SILDENAFIL CITRATE 20 MG PO TABS: ORAL_TABLET | ORAL | 1 refills | Status: DC

## 2020-01-14 ENCOUNTER — Ambulatory Visit (INDEPENDENT_AMBULATORY_CARE_PROVIDER_SITE_OTHER): Payer: Medicare Other | Admitting: Internal Medicine

## 2020-01-14 ENCOUNTER — Other Ambulatory Visit: Payer: Self-pay

## 2020-01-14 ENCOUNTER — Encounter: Payer: Self-pay | Admitting: Internal Medicine

## 2020-01-14 VITALS — BP 120/64 | HR 98 | Temp 97.7°F | Ht 65.0 in | Wt 213.0 lb

## 2020-01-14 DIAGNOSIS — E1122 Type 2 diabetes mellitus with diabetic chronic kidney disease: Secondary | ICD-10-CM

## 2020-01-14 DIAGNOSIS — Z794 Long term (current) use of insulin: Secondary | ICD-10-CM

## 2020-01-14 DIAGNOSIS — N184 Chronic kidney disease, stage 4 (severe): Secondary | ICD-10-CM | POA: Diagnosis not present

## 2020-01-14 DIAGNOSIS — I700 Atherosclerosis of aorta: Secondary | ICD-10-CM

## 2020-01-14 DIAGNOSIS — I129 Hypertensive chronic kidney disease with stage 1 through stage 4 chronic kidney disease, or unspecified chronic kidney disease: Secondary | ICD-10-CM

## 2020-01-14 DIAGNOSIS — N2581 Secondary hyperparathyroidism of renal origin: Secondary | ICD-10-CM

## 2020-01-14 DIAGNOSIS — E1141 Type 2 diabetes mellitus with diabetic mononeuropathy: Secondary | ICD-10-CM

## 2020-01-14 NOTE — Progress Notes (Signed)
Rutherford Nail as a scribe for [name], MD.,have documented all relevant documentation on the behalf of [name], MD,as directed by  [name], MD while in the presence of [name], MD. This visit occurred during the SARS-CoV-2 public health emergency.  Safety protocols were in place, including screening questions prior to the visit, additional usage of staff PPE, and extensive cleaning of exam room while observing appropriate contact time as indicated for disinfecting solutions.  Subjective:     Patient ID: Justin Holloway , male    DOB: 1930-01-05 , 87 y.o.   MRN: [id number]   Chief Complaint  Patient presents with  . Diabetes  . Peripheral Neuropathy    Balance     HPI  He presents today for diabetes and HTN check.  He reports compliance with meds.Unfortunately, he is still having burning pain in his feet. He thinks it could be affecting his balance.   Diabetes He presents for his follow-up diabetic visit. He has type 2 diabetes mellitus. His disease course has been stable. There are no hypoglycemic associated symptoms. Pertinent negatives for hypoglycemia include no dizziness or headaches. Pertinent negatives for diabetes include no fatigue, no polydipsia, no polyphagia and no polyuria. There are no hypoglycemic complications. Risk factors for coronary artery disease include dyslipidemia, hypertension, diabetes mellitus, male sex, sedentary lifestyle and obesity. He is compliant with treatment most of the time. He is following a generally healthy diet. He participates in exercise intermittently. Eye exam is not current.     Past Medical History:  Diagnosis Date  . Arthritis   . CKD (chronic kidney disease), stage III ([place])   . Diabetes mellitus without complication ([place])   . Glaucoma    "blurred vision", see Dr Gershon Crane yearly  . Hyperlipidemia   . Hypertension   . TIA (transient ischemic attack)      Family History  Problem Relation Age  of Onset  . Cancer Mother 79       breast  . COPD Father 87       Congestive heart failure     Current Outpatient Medications:  .  Ascorbic Acid (VITAMIN C) 1000 MG tablet, Take 1,000 mg by mouth daily. , Disp: , Rfl:  .  aspirin EC 81 MG tablet, Take 81 mg by mouth every evening., Disp: , Rfl:  .  Blood Glucose Monitoring Suppl (ONE TOUCH ULTRA 2) w/Device KIT, Use as directed to check blood sugars 2 times per day dx: e11.22, Disp: 1 kit, Rfl: 0 .  cholecalciferol (VITAMIN D) 1000 units tablet, Take 2,000 Units by mouth daily. , Disp: , Rfl:  .  ferrous sulfate 325 (65 FE) MG tablet, TAKE 1 TABLET BY MOUTH DAILY, Disp: , Rfl:  .  furosemide (LASIX) 80 MG tablet, TAKE 1 TABLET BY MOUTH IN  THE MORNING AND ONE-HALF  TABLET BY MOUTH IN THE  EARLY EVENING (Patient taking differently: 80 mg 2 (two) times daily. TAKE 1 TABLET BY MOUTH IN  THE MORNING AND ONE-HALF  TABLET BY MOUTH IN THE  EARLY EVENING), Disp: 135 tablet, Rfl: 3 .  glucose blood (ONETOUCH ULTRA) test strip, USE STRIPS TO TEST BLOOD  SUGAR 3 TIMES DAILY 1 STRIP PER TEST, Disp: 300 strip, Rfl: 3 .  hydrALAZINE (APRESOLINE) 25 MG tablet, TAKE 1 TABLET BY MOUTH 3  TIMES DAILY (Patient taking differently: 10 mg/kg 3 (three) times daily. Dosage change per Dr. Carolin Sicks), Disp: 270 tablet, Rfl: 3 .  insulin  degludec (TRESIBA) 200 UNIT/ML FlexTouch Pen, Inject 23 Units into the skin at bedtime. , Disp: , Rfl:  .  Lancets (ONETOUCH DELICA PLUS [id number]) MISC, 100 each by Does not apply route in the morning, at noon, and at bedtime., Disp: 300 each, Rfl: 3 .  LUMIGAN 0.01 % SOLN, Place 1 drop into both eyes 2 (two) times daily. , Disp: , Rfl:  .  Multiple Vitamin (MULTIVITAMIN WITH MINERALS) TABS tablet, Take 1 tablet by mouth daily., Disp: , Rfl:  .  polyethylene glycol powder (GLYCOLAX/MIRALAX) 17 GM/SCOOP powder, Take 17 g by mouth daily. , Disp: , Rfl:  .  pravastatin (PRAVACHOL) 40 MG tablet, TAKE 1 TABLET BY MOUTH  DAILY, Disp: 90  tablet, Rfl: 3 .  pregabalin (LYRICA) 25 MG capsule, Take 1 capsule (25 mg total) by mouth at bedtime as needed., Disp: 90 capsule, Rfl: 1 .  Semaglutide,0.25 or 0.5MG/DOS, (OZEMPIC, 0.25 OR 0.5 MG/DOSE,) 2 MG/1.5ML SOPN, Inject 0.5 mg into the skin once a week., Disp: 1.5 mL, Rfl: 1 .  sildenafil (REVATIO) 20 MG tablet, Take 1 tablet  by mouth as needed. MAX 3 TO 5 TABLETS PER DAY, Disp: 10 tablet, Rfl: 1 .  tamsulosin (FLOMAX) 0.4 MG CAPS capsule, Take 0.4 mg by mouth daily., Disp: , Rfl:    Allergies  Allergen Reactions  . Gabapentin Itching     Review of Systems  Constitutional: Negative.  Negative for fatigue.  HENT: Negative.   Respiratory: Negative.   Cardiovascular: Negative.   Endocrine: Negative for polydipsia, polyphagia and polyuria.  Musculoskeletal: Negative.   Skin: Negative.   Neurological: Negative for dizziness and headaches.  Psychiatric/Behavioral: Negative.      Today's Vitals   01/14/20 1131  BP: 120/64  Pulse: 98  Temp: 97.7 F (36.5 C)  TempSrc: Oral  Weight: 213 lb (96.6 kg)  Height: 5' 5"  (1.651 m)  PainSc: 0-No pain   Body mass index is 35.45 kg/m.  Wt Readings from Last 3 Encounters:  01/14/20 213 lb (96.6 kg)  10/29/19 221 lb 6.4 oz (100.4 kg)  09/23/19 223 lb 3.2 oz (101.2 kg)   Objective:  Physical Exam Vitals and nursing note reviewed.  Constitutional:      Appearance: Normal appearance.  HENT:     Head: Normocephalic and atraumatic.  Cardiovascular:     Rate and Rhythm: Normal rate and regular rhythm.     Heart sounds: Normal heart sounds.  Pulmonary:     Effort: Pulmonary effort is normal.     Breath sounds: Normal breath sounds.  Musculoskeletal:     Cervical back: Normal range of motion.     Right lower leg: Edema present.     Left lower leg: Edema present.  Skin:    General: Skin is warm.  Neurological:     General: No focal deficit present.     Mental Status: He is alert.  Psychiatric:        Mood and Affect: Mood  normal.         Assessment And Plan:     1. Type 2 diabetes mellitus with stage 4 chronic kidney disease, with long-term current use of insulin (HCC) Comments: Chronic, I will check labs as listed below. He is encouraged to comply with dietary recommendations.  - CMP14+EGFR - Hemoglobin A1c  2. Diabetic mononeuropathy associated with type 2 diabetes mellitus ([place]) Comments: Unfortunately, his sx are not well controlled with current Lyrica dosage. Unfortunately, dose is limited due to his renal function.  3. Hypertensive nephropathy Comments: Chronic, well controlled. He is encouraged to follow low sodium diet and c/w current meds.   4. Aortic atherosclerosis ([place]) Comments: Chronic, encouraged to follow heart healthy lifestyle. Importance of statin compliance was also d/w pt.   5. Secondary renal hyperparathyroidism ([place]) Comments: Chronic. I will recheck PTH at next visit. He is also followed by Ren[facility].    Patient was given opportunity to ask questions. Patient verbalized understanding of the plan and was able to repeat key elements of the plan. All questions were answered to their satisfaction.  [name], MD   I, [name], MD, have reviewed all documentation for this visit. The documentation on 01/14/20 for the exam, diagnosis, procedures, and orders are all accurate and complete.  THE PATIENT IS ENCOURAGED TO PRACTICE SOCIAL DISTANCING DUE TO THE COVID-19 PANDEMIC.

## 2020-01-14 NOTE — Patient Instructions (Signed)
Hypertension, Adult Hypertension is another name for high blood pressure. High blood pressure forces your heart to work harder to pump blood. This can cause problems over time. There are two numbers in a blood pressure reading. There is a top number (systolic) over a bottom number (diastolic). It is best to have a blood pressure that is below 120/80. Healthy choices can help lower your blood pressure, or you may need medicine to help lower it. What are the causes? The cause of this condition is not known. Some conditions may be related to high blood pressure. What increases the risk?  Smoking.  Having type 2 diabetes mellitus, high cholesterol, or both.  Not getting enough exercise or physical activity.  Being overweight.  Having too much fat, sugar, calories, or salt (sodium) in your diet.  Drinking too much alcohol.  Having long-term (chronic) kidney disease.  Having a family history of high blood pressure.  Age. Risk increases with age.  Race. You may be at higher risk if you are African American.  Gender. Men are at higher risk than women before age 45. After age 65, women are at higher risk than men.  Having obstructive sleep apnea.  Stress. What are the signs or symptoms?  High blood pressure may not cause symptoms. Very high blood pressure (hypertensive crisis) may cause: ? Headache. ? Feelings of worry or nervousness (anxiety). ? Shortness of breath. ? Nosebleed. ? A feeling of being sick to your stomach (nausea). ? Throwing up (vomiting). ? Changes in how you see. ? Very bad chest pain. ? Seizures. How is this treated?  This condition is treated by making healthy lifestyle changes, such as: ? Eating healthy foods. ? Exercising more. ? Drinking less alcohol.  Your health care provider may prescribe medicine if lifestyle changes are not enough to get your blood pressure under control, and if: ? Your top number is above 130. ? Your bottom number is above  80.  Your personal target blood pressure may vary. Follow these instructions at home: Eating and drinking   If told, follow the DASH eating plan. To follow this plan: ? Fill one half of your plate at each meal with fruits and vegetables. ? Fill one fourth of your plate at each meal with whole grains. Whole grains include whole-wheat pasta, brown rice, and whole-grain bread. ? Eat or drink low-fat dairy products, such as skim milk or low-fat yogurt. ? Fill one fourth of your plate at each meal with low-fat (lean) proteins. Low-fat proteins include fish, chicken without skin, eggs, beans, and tofu. ? Avoid fatty meat, cured and processed meat, or chicken with skin. ? Avoid pre-made or processed food.  Eat less than 1,500 mg of salt each day.  Do not drink alcohol if: ? Your doctor tells you not to drink. ? You are pregnant, may be pregnant, or are planning to become pregnant.  If you drink alcohol: ? Limit how much you use to:  0-1 drink a day for women.  0-2 drinks a day for men. ? Be aware of how much alcohol is in your drink. In the [place] one drink equals one 12 oz bottle of beer (355 mL), one 5 oz glass of wine (148 mL), or one 1 oz glass of hard liquor (44 mL). Lifestyle   Work with your doctor to stay at a healthy weight or to lose weight. Ask your doctor what the best weight is for you.  Get at least 30 minutes of exercise most   days of the week. This may include walking, swimming, or biking.  Get at least 30 minutes of exercise that strengthens your muscles (resistance exercise) at least 3 days a week. This may include lifting weights or doing Pilates.  Do not use any products that contain nicotine or tobacco, such as cigarettes, e-cigarettes, and chewing tobacco. If you need help quitting, ask your doctor.  Check your blood pressure at home as told by your doctor.  Keep all follow-up visits as told by your doctor. This is important. Medicines  Take over-the-counter  and prescription medicines only as told by your doctor. Follow directions carefully.  Do not skip doses of blood pressure medicine. The medicine does not work as well if you skip doses. Skipping doses also puts you at risk for problems.  Ask your doctor about side effects or reactions to medicines that you should watch for. Contact a doctor if you:  Think you are having a reaction to the medicine you are taking.  Have headaches that keep coming back (recurring).  Feel dizzy.  Have swelling in your ankles.  Have trouble with your vision. Get help right away if you:  Get a very bad headache.  Start to feel mixed up (confused).  Feel weak or numb.  Feel faint.  Have very bad pain in your: ? Chest. ? Belly (abdomen).  Throw up more than once.  Have trouble breathing. Summary  Hypertension is another name for high blood pressure.  High blood pressure forces your heart to work harder to pump blood.  For most people, a normal blood pressure is less than 120/80.  Making healthy choices can help lower blood pressure. If your blood pressure does not get lower with healthy choices, you may need to take medicine. This information is not intended to replace advice given to you by your health care provider. Make sure you discuss any questions you have with your health care provider. Document Revised: 09/05/2017 Document Reviewed: 09/05/2017 Elsevier Patient Education  2020 Elsevier Inc[employer] Diabetes Mellitus and Exercise Exercising regularly is important for your overall health, especially when you have diabetes (diabetes mellitus). Exercising is not only about losing weight. It has many other health benefits, such as increasing muscle strength and bone density and reducing body fat and stress. This leads to improved fitness, flexibility, and endurance, all of which result in better overall health. Exercise has additional benefits for people with diabetes, including:  Reducing  appetite.  Helping to lower and control blood glucose.  Lowering blood pressure.  Helping to control amounts of fatty substances (lipids) in the blood, such as cholesterol and triglycerides.  Helping the body to respond better to insulin (improving insulin sensitivity).  Reducing how much insulin the body needs.  Decreasing the risk for heart disease by: ? Lowering cholesterol and triglyceride levels. ? Increasing the levels of good cholesterol. ? Lowering blood glucose levels. What is my activity plan? Your health care provider or certified diabetes educator can help you make a plan for the type and frequency of exercise (activity plan) that works for you. Make sure that you:  Do at least 150 minutes of moderate-intensity or vigorous-intensity exercise each week. This could be brisk walking, biking, or water aerobics. ? Do stretching and strength exercises, such as yoga or weightlifting, at least 2 times a week. ? Spread out your activity over at least 3 days of the week.  Get some form of physical activity every day. ? Do not go more than 2 days   in a row without some kind of physical activity. ? Avoid being inactive for more than 30 minutes at a time. Take frequent breaks to walk or stretch.  Choose a type of exercise or activity that you enjoy, and set realistic goals.  Start slowly, and gradually increase the intensity of your exercise over time. What do I need to know about managing my diabetes?   Check your blood glucose before and after exercising. ? If your blood glucose is 240 mg/dL (13.3 mmol/L) or higher before you exercise, check your urine for ketones. If you have ketones in your urine, do not exercise until your blood glucose returns to normal. ? If your blood glucose is 100 mg/dL (5.6 mmol/L) or lower, eat a snack containing 15-20 grams of carbohydrate. Check your blood glucose 15 minutes after the snack to make sure that your level is above 100 mg/dL (5.6 mmol/L)  before you start your exercise.  Know the symptoms of low blood glucose (hypoglycemia) and how to treat it. Your risk for hypoglycemia increases during and after exercise. Common symptoms of hypoglycemia can include: ? Hunger. ? Anxiety. ? Sweating and feeling clammy. ? Confusion. ? Dizziness or feeling light-headed. ? Increased heart rate or palpitations. ? Blurry vision. ? Tingling or numbness around the mouth, lips, or tongue. ? Tremors or shakes. ? Irritability.  Keep a rapid-acting carbohydrate snack available before, during, and after exercise to help prevent or treat hypoglycemia.  Avoid injecting insulin into areas of the body that are going to be exercised. For example, avoid injecting insulin into: ? The arms, when playing tennis. ? The legs, when jogging.  Keep records of your exercise habits. Doing this can help you and your health care provider adjust your diabetes management plan as needed. Write down: ? Food that you eat before and after you exercise. ? Blood glucose levels before and after you exercise. ? The type and amount of exercise you have done. ? When your insulin is expected to peak, if you use insulin. Avoid exercising at times when your insulin is peaking.  When you start a new exercise or activity, work with your health care provider to make sure the activity is safe for you, and to adjust your insulin, medicines, or food intake as needed.  Drink plenty of water while you exercise to prevent dehydration or heat stroke. Drink enough fluid to keep your urine clear or pale yellow. Summary  Exercising regularly is important for your overall health, especially when you have diabetes (diabetes mellitus).  Exercising has many health benefits, such as increasing muscle strength and bone density and reducing body fat and stress.  Your health care provider or certified diabetes educator can help you make a plan for the type and frequency of exercise (activity plan)  that works for you.  When you start a new exercise or activity, work with your health care provider to make sure the activity is safe for you, and to adjust your insulin, medicines, or food intake as needed. This information is not intended to replace advice given to you by your health care provider. Make sure you discuss any questions you have with your health care provider. Document Revised: 07/20/2016 Document Reviewed: 06/07/2015 Elsevier Patient Education  2020 Elsevier Inc.  

## 2020-01-15 ENCOUNTER — Ambulatory Visit: Payer: Self-pay

## 2020-01-15 ENCOUNTER — Telehealth: Payer: Medicare Other

## 2020-01-15 DIAGNOSIS — E1122 Type 2 diabetes mellitus with diabetic chronic kidney disease: Secondary | ICD-10-CM

## 2020-01-15 DIAGNOSIS — E785 Hyperlipidemia, unspecified: Secondary | ICD-10-CM

## 2020-01-15 DIAGNOSIS — E1141 Type 2 diabetes mellitus with diabetic mononeuropathy: Secondary | ICD-10-CM

## 2020-01-15 DIAGNOSIS — E559 Vitamin D deficiency, unspecified: Secondary | ICD-10-CM

## 2020-01-15 DIAGNOSIS — N184 Chronic kidney disease, stage 4 (severe): Secondary | ICD-10-CM

## 2020-01-15 DIAGNOSIS — I700 Atherosclerosis of aorta: Secondary | ICD-10-CM

## 2020-01-15 DIAGNOSIS — I129 Hypertensive chronic kidney disease with stage 1 through stage 4 chronic kidney disease, or unspecified chronic kidney disease: Secondary | ICD-10-CM

## 2020-01-15 LAB — CMP14+EGFR
ALT: 8 IU/L (ref 0–44)
AST: 18 IU/L (ref 0–40)
Albumin/Globulin Ratio: 1.6 (ref 1.2–2.2)
Albumin: 4.4 g/dL (ref 3.6–4.6)
Alkaline Phosphatase: 92 IU/L (ref 44–121)
BUN/Creatinine Ratio: 16 (ref 10–24)
BUN: 51 mg/dL — ABNORMAL HIGH (ref 8–27)
Bilirubin Total: 0.4 mg/dL (ref 0.0–1.2)
CO2: 21 mmol/L (ref 20–29)
Calcium: 9.5 mg/dL (ref 8.6–10.2)
Chloride: 100 mmol/L (ref 96–106)
Creatinine, Ser: 3.25 mg/dL — ABNORMAL HIGH (ref 0.76–1.27)
GFR calc Af Amer: 19 mL/min/{1.73_m2} — ABNORMAL LOW (ref >59)
GFR calc non Af Amer: 16 mL/min/{1.73_m2} — ABNORMAL LOW (ref >59)
Globulin, Total: 2.8 g/dL (ref 1.5–4.5)
Glucose: 114 mg/dL — ABNORMAL HIGH (ref 65–99)
Potassium: 4.5 mmol/L (ref 3.5–5.2)
Sodium: 140 mmol/L (ref 134–144)
Total Protein: 7.2 g/dL (ref 6.0–8.5)

## 2020-01-15 LAB — HEMOGLOBIN A1C
Est. average glucose Bld gHb Est-mCnc: 148 mg/dL
Hgb A1c MFr Bld: 6.8 % — ABNORMAL HIGH (ref 4.8–5.6)

## 2020-01-16 NOTE — Chronic Care Management (AMB) (Signed)
Chronic Care Management   Follow Up Note   01/15/2020 Name: Justin Holloway MRN: [id number] DOB: 1930/01/05  Referred by: Justin Chard, MD Reason for referral : Chronic Care Management (RN CM FU Call )   Justin Holloway is a 87 y.o. year old male who is a primary care patient of Justin Chard, MD. The CCM team was consulted for assistance with chronic disease management and care coordination needs.    Review of patient status, including review of consultants reports, relevant laboratory and other test results, and collaboration with appropriate care team members and the patient's provider was performed as part of comprehensive patient evaluation and provision of chronic care management services.    SDOH (Social Determinants of Health) assessments performed: Yes - no acute needs identified  See Care Plan activities for detailed interventions related to Justin Holloway)  Placed outbound CCM RN CM follow up call to patient for a care plan update.     Outpatient Encounter Medications as of 01/15/2020  Medication Sig  . Ascorbic Acid (VITAMIN C) 1000 MG tablet Take 1,000 mg by mouth daily.   [name] aspirin EC 81 MG tablet Take 81 mg by mouth every evening.  . Blood Glucose Monitoring Suppl (ONE TOUCH ULTRA 2) w/Device KIT Use as directed to check blood sugars 2 times per day dx: e11.22  . cholecalciferol (VITAMIN D) 1000 units tablet Take 2,000 Units by mouth daily.   . ferrous sulfate 325 (65 FE) MG tablet TAKE 1 TABLET BY MOUTH DAILY  . furosemide (LASIX) 80 MG tablet TAKE 1 TABLET BY MOUTH IN  THE MORNING AND ONE-HALF  TABLET BY MOUTH IN THE  EARLY EVENING  . glucose blood (ONETOUCH ULTRA) test strip USE STRIPS TO TEST BLOOD  SUGAR 3 TIMES DAILY 1 STRIP PER TEST  . hydrALAZINE (APRESOLINE) 25 MG tablet TAKE 1 TABLET BY MOUTH 3  TIMES DAILY  . insulin degludec (TRESIBA) 200 UNIT/ML FlexTouch Pen Inject 23 Units into the skin at bedtime.   . Lancets (ONETOUCH DELICA PLUS [id number]) MISC 100 each by  Does not apply route in the morning, at noon, and at bedtime.  [name] LUMIGAN 0.01 % SOLN Place 1 drop into both eyes 2 (two) times daily.   . Multiple Vitamin (MULTIVITAMIN WITH MINERALS) TABS tablet Take 1 tablet by mouth daily.  . polyethylene glycol powder (GLYCOLAX/MIRALAX) 17 GM/SCOOP powder Take 17 g by mouth daily.   . pravastatin (PRAVACHOL) 40 MG tablet TAKE 1 TABLET BY MOUTH  DAILY  . pregabalin (LYRICA) 25 MG capsule Take 1 capsule (25 mg total) by mouth at bedtime as needed.  . Semaglutide,0.25 or 0.5MG/DOS, (OZEMPIC, 0.25 OR 0.5 MG/DOSE,) 2 MG/1.5ML SOPN Inject 0.5 mg into the skin once a week.  . sildenafil (REVATIO) 20 MG tablet Take 1 tablet  by mouth as needed. MAX 3 TO 5 TABLETS PER DAY  . tamsulosin (FLOMAX) 0.4 MG CAPS capsule Take 0.4 mg by mouth daily.   No facility-administered encounter medications on file as of 01/15/2020.     Objective:  Lab Results  Component Value Date   HGBA1C 6.8 (H) 01/14/2020   HGBA1C 6.8 (H) 09/10/2019   HGBA1C 6.7 (H) 06/10/2019   Lab Results  Component Value Date   MICROALBUR 80 05/28/2019   LDLCALC 104 (H) 09/10/2019   CREATININE 3.25 (H) 01/14/2020   BP Readings from Last 3 Encounters:  01/14/20 120/64  10/29/19 110/60  09/23/19 126/64    Goals Addressed     Patient Care Plan:  General Plan of Care (Adult)    Problem Identified: Chronic Kidney Disease   Priority: High    Long-Range Goal: Maintain Adequate Kidney Function   Start Date: 01/15/2020  Expected End Date: 04/14/2020  This Visit's Progress: On track  Priority: High  Note:   Current Barriers:  [name] Knowledge Deficits related to disease process and treatment mangement for renal insufficiency/CKD . Chronic Disease Management support and education needs related to DM, CKD Stage 4, Hypertensive Nephropathy Nurse Case Manager Clinical Goal(s):  [name]  Over the next 90 days, patient will continue to work with the CCM team and PCP for disease education and support for improved Self  Health management of stage 4 CKD CCM RN CM Interventions:  01/15/20 Completed call with patient and wife  . Evaluation of current treatment plan related to stage IV CKD and patient's adherence to plan as established by provider . Educated patient/wife on current GFR <19; Re-educated on stages of chronic kidney disease; Determined patient is established with [facility] with next f/u visit scheduled next month . Encouraged patient to contact his Nephrologist office to request an earlier visit due to declining renal function  . Re-educated and reviewed disease progression prevention and how to maintain adequate renal function  . Educated on signs/symptoms suggestive of ESRD and AKI and when to call the doctor if needed  . Discussed plans with patient for ongoing care management follow up and provided patient with direct contact information for care management team 01/16/20 call completed with wife . Determined patient will f/u with Dr. Carolin Holloway, Nephrologist next Wednesday on 01/21/20 . Sent in basket message to PCP Dr. Baird Holloway with notification of patient's next f/u with Nephrology Patient Goals/ Self Care Activities:  Over the next 90 days, patient will:  . Continue to self administer medications as prescribed . Attend all scheduled provider appointments . Call pharmacy for medication refills . Call provider office for new concerns or questions  Next planned follow up date: 01/23/20    Plan:   Telephone follow up appointment with care management team member scheduled for:  01/23/20  Barb Merino, RN, BSN, CCM Care Management Coordinator [facility]  Direct Phone: [phone]

## 2020-01-16 NOTE — Patient Instructions (Addendum)
Visit Information   .  Maintain adequate kidney function (pt-stated)      Timeframe:  Long-Range Goal Priority:  High Start Date:    01/15/20                         Expected End Date:   04/14/20   Next Follow up date:  01/23/20  Over the next 90 days, patient will:  .  Continue to self administer medications as prescribed . Attend all scheduled provider appointments . Call pharmacy for medication refills . Call provider office for new concerns or questions             The patient verbalized understanding of instructions, educational materials, and care plan provided today and declined offer to receive copy of patient instructions, educational materials, and care plan.   Telephone follow up appointment with care management team member scheduled for: 01/23/20  Lynne Logan, RN

## 2020-01-21 DIAGNOSIS — N184 Chronic kidney disease, stage 4 (severe): Secondary | ICD-10-CM | POA: Diagnosis not present

## 2020-01-21 DIAGNOSIS — D631 Anemia in chronic kidney disease: Secondary | ICD-10-CM | POA: Diagnosis not present

## 2020-01-21 DIAGNOSIS — N2581 Secondary hyperparathyroidism of renal origin: Secondary | ICD-10-CM | POA: Diagnosis not present

## 2020-01-21 DIAGNOSIS — R609 Edema, unspecified: Secondary | ICD-10-CM | POA: Diagnosis not present

## 2020-01-21 DIAGNOSIS — I129 Hypertensive chronic kidney disease with stage 1 through stage 4 chronic kidney disease, or unspecified chronic kidney disease: Secondary | ICD-10-CM | POA: Diagnosis not present

## 2020-01-21 DIAGNOSIS — N189 Chronic kidney disease, unspecified: Secondary | ICD-10-CM | POA: Diagnosis not present

## 2020-01-23 ENCOUNTER — Telehealth: Payer: Medicare Other

## 2020-01-27 ENCOUNTER — Telehealth: Payer: Medicare Other

## 2020-01-27 ENCOUNTER — Other Ambulatory Visit: Payer: Self-pay

## 2020-01-27 ENCOUNTER — Ambulatory Visit: Payer: Self-pay

## 2020-01-27 DIAGNOSIS — Z794 Long term (current) use of insulin: Secondary | ICD-10-CM

## 2020-01-27 DIAGNOSIS — N184 Chronic kidney disease, stage 4 (severe): Secondary | ICD-10-CM

## 2020-01-27 DIAGNOSIS — E1122 Type 2 diabetes mellitus with diabetic chronic kidney disease: Secondary | ICD-10-CM

## 2020-01-27 DIAGNOSIS — E1141 Type 2 diabetes mellitus with diabetic mononeuropathy: Secondary | ICD-10-CM

## 2020-01-27 DIAGNOSIS — E785 Hyperlipidemia, unspecified: Secondary | ICD-10-CM

## 2020-01-27 DIAGNOSIS — I129 Hypertensive chronic kidney disease with stage 1 through stage 4 chronic kidney disease, or unspecified chronic kidney disease: Secondary | ICD-10-CM

## 2020-01-27 DIAGNOSIS — E559 Vitamin D deficiency, unspecified: Secondary | ICD-10-CM

## 2020-01-29 ENCOUNTER — Ambulatory Visit: Payer: Self-pay

## 2020-01-29 DIAGNOSIS — Z794 Long term (current) use of insulin: Secondary | ICD-10-CM

## 2020-01-29 DIAGNOSIS — E1122 Type 2 diabetes mellitus with diabetic chronic kidney disease: Secondary | ICD-10-CM

## 2020-01-29 DIAGNOSIS — I10 Essential (primary) hypertension: Secondary | ICD-10-CM

## 2020-01-29 NOTE — Chronic Care Management (AMB) (Signed)
Chronic Care Management   CCM RN Visit Note  01/27/2020 Name: Justin Holloway MRN: [id number] DOB: 05/17/1929  Subjective: Justin Holloway is a 87 y.o. year old male who is a primary care patient of Justin Chard, MD. The care management team was consulted for assistance with disease management and care coordination needs.    Engaged with patient by telephone for follow up visit in response to provider referral for case management and/or care coordination services.   Consent to Services:  The patient was given information about Chronic Care Management services, agreed to services, and gave verbal consent prior to initiation of services.  Please see initial visit note for detailed documentation.   Patient agreed to services and verbal consent obtained.   Assessment: Review of patient past medical history, allergies, medications, health status, including review of consultants reports, laboratory and other test data, was performed as part of comprehensive evaluation and provision of chronic care management services.   SDOH (Social Determinants of Health) assessments and interventions performed:  No  CCM Care Plan  Allergies  Allergen Reactions  . Gabapentin Itching    Outpatient Encounter Medications as of 01/27/2020  Medication Sig  . Ascorbic Acid (VITAMIN C) 1000 MG tablet Take 1,000 mg by mouth daily.   [name] aspirin EC 81 MG tablet Take 81 mg by mouth every evening.  . Blood Glucose Monitoring Suppl (ONE TOUCH ULTRA 2) w/Device KIT Use as directed to check blood sugars 2 times per day dx: e11.22  . cholecalciferol (VITAMIN D) 1000 units tablet Take 2,000 Units by mouth daily.   . ferrous sulfate 325 (65 FE) MG tablet TAKE 1 TABLET BY MOUTH DAILY  . furosemide (LASIX) 80 MG tablet TAKE 1 TABLET BY MOUTH IN  THE MORNING AND ONE-HALF  TABLET BY MOUTH IN THE  EARLY EVENING (Patient taking differently: 80 mg 2 (two) times daily. TAKE 1 TABLET BY MOUTH IN  THE MORNING AND ONE-HALF   TABLET BY MOUTH IN THE  EARLY EVENING)  . glucose blood (ONETOUCH ULTRA) test strip USE STRIPS TO TEST BLOOD  SUGAR 3 TIMES DAILY 1 STRIP PER TEST  . hydrALAZINE (APRESOLINE) 25 MG tablet TAKE 1 TABLET BY MOUTH 3  TIMES DAILY (Patient taking differently: 10 mg/kg 3 (three) times daily. Dosage change per Dr. Carolin Sicks)  . insulin degludec (TRESIBA) 200 UNIT/ML FlexTouch Pen Inject 23 Units into the skin at bedtime.   . Lancets (ONETOUCH DELICA PLUS [id number]) MISC 100 each by Does not apply route in the morning, at noon, and at bedtime.  [name] LUMIGAN 0.01 % SOLN Place 1 drop into both eyes 2 (two) times daily.   . Multiple Vitamin (MULTIVITAMIN WITH MINERALS) TABS tablet Take 1 tablet by mouth daily.  . polyethylene glycol powder (GLYCOLAX/MIRALAX) 17 GM/SCOOP powder Take 17 g by mouth daily.   . pravastatin (PRAVACHOL) 40 MG tablet TAKE 1 TABLET BY MOUTH  DAILY  . pregabalin (LYRICA) 25 MG capsule Take 1 capsule (25 mg total) by mouth at bedtime as needed.  . Semaglutide,0.25 or 0.5MG/DOS, (OZEMPIC, 0.25 OR 0.5 MG/DOSE,) 2 MG/1.5ML SOPN Inject 0.5 mg into the skin once a week.  . sildenafil (REVATIO) 20 MG tablet Take 1 tablet  by mouth as needed. MAX 3 TO 5 TABLETS PER DAY  . tamsulosin (FLOMAX) 0.4 MG CAPS capsule Take 0.4 mg by mouth daily.   No facility-administered encounter medications on file as of 01/27/2020.    Patient Active Problem List   Diagnosis Date Noted  .  Secondary renal hyperparathyroidism ([place]) 01/14/2020  . Type 2 diabetes mellitus with stage 4 chronic kidney disease, with long-term current use of insulin ([place]) 09/10/2019  . Peripheral vascular disease, unspecified ([place]) 05/28/2019  . Diabetic neuropathy ([place]) 02/18/2019  . Left upper quadrant pain 09/04/2018  . Acute midline low back pain without sciatica 09/04/2018  . CKD (chronic kidney disease) stage 4, GFR 15-29 ml/min (H[facility]) 11/21/2017  . Hypertensive nephropathy 11/21/2017  . Right foot pain 11/21/2017  . Vertigo  09/09/2017  . BPPV (benign paroxysmal positional vertigo), right 09/06/2017  . Sensorineural hearing loss (SNHL), bilateral 09/06/2017  . Ataxia 02/06/2017  . UTI (lower urinary tract infection) 08/07/2015  . Dizziness 08/07/2015  . Acute renal failure superimposed on stage 3 chronic kidney disease ([place]) 08/07/2015  . Hyperkalemia 08/07/2015  . Left inguinal hernia 05/15/2013  . Diabetes mellitus without complication ([place]) 96/28/3662  . HLD (hyperlipidemia) 03/08/2006  . HYPERTENSION, BENIGN SYSTEMIC 03/08/2006  . 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 PROTEINURIA 03/08/2006    Conditions to be addressed/monitored:DM, CKD Stage 4, Hypertensive Nephropathy, Diabetic mononeuropathy associated with type 2 Diabetes, Vitamin D deficiency, HLD  Care Plan : General Plan of Care (Adult)  Updates made by Lynne Logan, RN since 01/29/2020 12:00 AM    Problem: Chronic Kidney Disease   Priority: High    Long-Range Goal: Maintain Adequate Kidney Function   Start Date: 01/15/2020  Expected End Date: 04/14/2020  Recent Progress: On track  Priority: High  Note:   Current Barriers:  [name] Knowledge Deficits related to disease process and treatment mangement for renal insufficiency/CKD . Chronic Disease Management support and education needs related to DM, CKD Stage 4, Hypertensive Nephropathy Nurse Case Manager Clinical Goal(s):  [name]  Over the next 90 days, patient will continue to work with the CCM team and PCP for disease education and support for improved Self Health management of stage 4 CKD CCM RN CM Interventions:  01/27/20 Completed call with patient and wife  . Evaluation of current treatment plan related to stage IV CKD and patient's adherence to plan as established by provider . Determined patient completed a follow up visit with his Nephrologist on 01/22/20  . Determined Dr. Carolin Sicks repeated blood work for evaluation of patient's renal function; Discussed patient was informed his GFR was 21 with  repeat labs . Determined Dr. Carolin Sicks recommended dosage changes to patient's Hydralazine and Furosemide, no fluid or dietary recommendations were given   . Re-Educated on signs/symptoms suggestive of ESRD and AKI and when to call the doctor if needed  . Discussed plans with patient for ongoing care management follow up and provided patient with direct contact information for care management team Patient Goals/ Self Care Activities:  Over the next 90 days, patient will:  . Continue to self administer medications as prescribed . Attend all scheduled provider appointments . Call pharmacy for medication refills . Call provider office for new concerns or questions  Next planned follow up date: 02/25/20     Plan:Telephone follow up appointment with care management team member scheduled for:  02/25/20  Barb Merino, RN, BSN, CCM Care Management Coordinator [place] [facility]  Direct Phone: [phone]

## 2020-01-29 NOTE — Patient Instructions (Signed)
Goals Addressed      Patient Stated   .  Maintain adequate kidney function (pt-stated)   On track     Timeframe:  Long-Range Goal Priority:  High Start Date:    01/15/20                         Expected End Date:   04/14/20   Next Follow up date:  02/25/20  Over the next 90 days, patient will:  . Continue to self administer medications as prescribed . Attend all scheduled provider appointments . Call pharmacy for medication refills . Call provider office for new concerns or questions

## 2020-01-29 NOTE — Chronic Care Management (AMB) (Signed)
Chronic Care Management Pharmacy  Name: Justin Holloway  MRN: [id number] DOB: Oct 26, 1929  Chief Complaint/ HPI  Justin Holloway,  87 y.o. , male presents for their Initial CCM visit with the clinical pharmacist via telephone due to COVID-19 Pandemic. They have been married for 57 years. They have seven children and they have many grand kids and great grand kids. They tend to stay home and they go to the [employer]. Patient reports that he sleeps well and is well rested. Patient has been retired for 9 years, he worked until he was in his 63's. He retired from [employer] and then worked for [employer]. He reports working all of his life. He has one sister.  PCP : [name], MD  Their chronic conditions include: Hypertension, Peripheral Vascular disease, Diabetes mellitus, Diabetic neuropathy, CKD stage 4, Hyperlipidemia, Vertigo  Office Visits: 06/18/2019 Telephone call: Tyler Aas reduced to 23 units nightly since HgbA1c is 6.7%.   05/28/2019 AWV and OV:  Presented for DM and HTN follow up. Encouraged to avoid salting foods. EKG for chest pain. NSR w/o acute changes. Recommended 11-20 lb weight loss. Reminded to do chair exercises. Shingrix vaccine discussed. Recommend annual visit for glaucoma screening.   04/28/2019 - UTI - rocephin injection in office. Second UTI in 1 month. Sending urine for culture. Pt did not stop by lab for PSA.   02/28/2019 - Lab results telephone call. Cholesterol above goal - is patient taking cholesterol med? A1c 7%. Stable Kidney Function.   02/26/2019 OV: Presented for Dm and HTN follow up. Referral to pain clinic for neuropathy since no relief from gabapentin or Lyrica. Complained or urinary frequency and wants to stop fluid pills. Labs ordered (CMP14+EGFR, HgbA1c, lipid panel, UA and culture).   Consult Visits: 05/23/2019 - ED f/u call - urine culture positive for UTI. If symptoms improving with antibiotic continue as prescribed.    05/21/2019 - ED visit -acute cystitis and ileus due to constipation on x-ray. Prescribed Keflex 500 mg qid.  05/21/2019 - Urgent Care for abdominal pain/back pain - x-ray showed ileus. Referred to ED.   03/25/19 Pain management OV w/ P. Campbell: Opioid not recommended. Pt can discontinue Lyrica if ineffective. Recommended capsaicin cream.   02/18/2019 - Podiatry - debridement of mycotic and hypertrophic toenails. F/U 3 months.   01/21/2019 - Opthalmology - eye exam.  CCM Visits: 06/02/2019 - RN CCM - referral to pain management for neuropathy. Work to modify factors to minimize shifts in glucose level.   02/26/2019 - Pharmacist - patient approved for novo nordisk PAP and picked up 4 month supply of Ozempic and [place].    02/12/2019 - Pharmacist - coordinated with CPhT for patient assistance.  01/22/2019 - CCM RN - checking on referral to [name]. Nurse contacted Dr. Si Gaul office to follow-up and provide clinical documentation.   01/22/2019 - Patient approved for Novo nordisk Tyler Aas and Ozempic) patient assistance and Lyrica.   01/15/2019 - Pharmacist call - Diabetes related education diabetes-friendly diet, discussed injection glp-1 technique, reviewed medication purpose/side effects.   01/08/2019 - CPhT - Received provider portion(s) of patient assistance application(s) for Lyrica. Faxed completed application and required documents into [employer].  01/06/2019 - CPhT - Received patient portion(s) of patient assistance application(s) for Ozempic, Tresiba and Lyrica. Will fax completed application and required documents into [employer] for [place] and Ozempic on 01/13/19.  Medications: Outpatient Encounter Medications as of 01/29/2020  Medication Sig  . Ascorbic Acid (VITAMIN C)  1000 MG tablet Take 1,000 mg by mouth daily.   [name] aspirin EC 81 MG tablet Take 81 mg by mouth every evening.  . cholecalciferol (VITAMIN D) 1000 units tablet Take 2,000 Units by mouth daily.   .  ferrous sulfate 325 (65 FE) MG tablet TAKE 1 TABLET BY MOUTH DAILY  . furosemide (LASIX) 80 MG tablet TAKE 1 TABLET BY MOUTH IN  THE MORNING AND ONE-HALF  TABLET BY MOUTH IN THE  EARLY EVENING (Patient taking differently: 80 mg 2 (two) times daily. TAKE 1 TABLET BY MOUTH IN  THE MORNING AND ONE-HALF  TABLET BY MOUTH IN THE  EARLY EVENING)  . glucose blood (ONETOUCH ULTRA) test strip USE STRIPS TO TEST BLOOD  SUGAR 3 TIMES DAILY 1 STRIP PER TEST  . Blood Glucose Monitoring Suppl (ONE TOUCH ULTRA 2) w/Device KIT Use as directed to check blood sugars 2 times per day dx: e11.22  . hydrALAZINE (APRESOLINE) 25 MG tablet TAKE 1 TABLET BY MOUTH 3  TIMES DAILY (Patient taking differently: 10 mg/kg 3 (three) times daily. Dosage change per Dr. Carolin Sicks)  . insulin degludec (TRESIBA) 200 UNIT/ML FlexTouch Pen Inject 23 Units into the skin at bedtime.   . Lancets (ONETOUCH DELICA PLUS [id number]) MISC 100 each by Does not apply route in the morning, at noon, and at bedtime.  [name] LUMIGAN 0.01 % SOLN Place 1 drop into both eyes 2 (two) times daily.   . Multiple Vitamin (MULTIVITAMIN WITH MINERALS) TABS tablet Take 1 tablet by mouth daily.  . polyethylene glycol powder (GLYCOLAX/MIRALAX) 17 GM/SCOOP powder Take 17 g by mouth daily.   . pravastatin (PRAVACHOL) 40 MG tablet TAKE 1 TABLET BY MOUTH  DAILY  . pregabalin (LYRICA) 25 MG capsule Take 1 capsule (25 mg total) by mouth at bedtime as needed.  . Semaglutide,0.25 or 0.5MG/DOS, (OZEMPIC, 0.25 OR 0.5 MG/DOSE,) 2 MG/1.5ML SOPN Inject 0.5 mg into the skin once a week.  . sildenafil (REVATIO) 20 MG tablet Take 1 tablet  by mouth as needed. MAX 3 TO 5 TABLETS PER DAY  . tamsulosin (FLOMAX) 0.4 MG CAPS capsule Take 0.4 mg by mouth daily.   No facility-administered encounter medications on file as of 01/29/2020.   Allergies  Allergen Reactions  . Gabapentin Itching   SDOH Screenings   Alcohol Screen: Not on file  Depression (PHQ2-9): Low Risk   . PHQ-2 Score: 0   Financial Resource Strain: Medium Risk  . Difficulty of Paying Living Expenses: Somewhat hard  Food Insecurity: No Food Insecurity  . Worried About Charity fundraiser in the Last Year: Never true  . Ran Out of Food in the Last Year: Never true  Housing: Not on file  Physical Activity: Insufficiently Active  . Days of Exercise per Week: 7 days  . Minutes of Exercise per Session: 20 min  Social Connections: Not on file  Stress: No Stress Concern Present  . Feeling of Stress : Not at all  Tobacco Use: Medium Risk  . Smoking Tobacco Use: Former Smoker  . Smokeless Tobacco Use: Never Used  Transportation Needs: No Transportation Needs  . Lack of Transportation (Medical): No  . Lack of Transportation (Non-Medical): No    Current Diagnosis/Assessment:     Goals Addressed            This Visit's Progress   . Pharmacy Care Plan       CARE PLAN ENTRY (see longitudinal plan of care for additional care plan information)  Current Barriers:  .  Chronic Disease Management support, education, and care coordination needs related to Hypertension, Hyperlipidemia, and Diabetes   Hypertension BP Readings from Last 3 Encounters:  05/28/19 118/64  05/28/19 118/64  05/21/19 132/69   . Pharmacist Clinical Goal(s): o Over the next 180 days, patient will work with PharmD and providers to maintain BP goal <130/80 . Current regimen:  o Hydralazine $RemoveBefor'10mg'UZkBfFWsRmLH$  three times daily  . Interventions: o Dietary and exercise recommendations provided . Patient self care activities - Over the next 90 days, patient will: o Check BP if symptomatic, document, and provide at future appointments o Ensure daily salt intake < 2300 mg/day o Try to exercise for 30 minutes 5 times weekly, 150 minutes per week total  Hyperlipidemia Lab Results  Component Value Date/Time   LDLCALC 102 (H) 02/26/2019 10:38 AM   . Pharmacist Clinical Goal(s): o Over the next 90 days, patient will work with PharmD and providers  to achieve LDL goal < 70 . Current regimen:  o Pravastatin $RemoveBefor'40mg'embukHUwdcSX$  daily . Interventions: o Discuss with PCP, changing to atorvastatin $RemoveBeforeD'40mg'OzwZVgihRgEMCz$  daily due to LDL above goal and kidney function o Provided dietary and exercise recommendations . Patient self care activities - Over the next 90 days, patient will: o Start atorvastatin 10 mg daily, if PCP agrees to switch medication o Try to exercise for 30 minutes 5 times weekly, 150 minutes per week total  Diabetes Lab Results  Component Value Date/Time   HGBA1C 6.7 (H) 06/10/2019 10:00 AM   HGBA1C 7.0 (H) 02/26/2019 10:38 AM   . Pharmacist Clinical Goal(s): o Over the next 180 days, patient will work with PharmD and providers to maintain A1c goal <7% . Current regimen:  o Tresiba 23 units nightly o Ozempic 0.$RemoveBef'5mg'QLqzfuHNHk$  weekly . Interventions: o Completed and faxed refill/reorder form for [place] and Ozempic to [employer] patient assistance program - Determined patient received sample at last office visit o Will discuss increasing Lyrica to $RemoveB'50mg'rKpNhKHj$  daily with PCP for neuropathy o Provided dietary and exercise recommendations . Patient self care activities - Over the next 90 days, patient will: o Check blood sugar once daily, document, and provide at future appointments o Contact provider with any episodes of hypoglycemia o Increase Lyrica to $RemoveB'50mg'hqAGgfpJ$  daily if PCP agrees o Try to exercise for 30 minutes 5 times weekly, 150 minutes per week total  Medication management . Pharmacist Clinical Goal(s): o Over the next 90 days, patient will work with PharmD and providers to maintain optimal medication adherence . Current pharmacy: [facility] . Interventions o Comprehensive medication review performed. o Continue current medication management strategy . Patient self care activities - Over the next 90 days, patient will: o Focus on medication adherence by continued use of pill box o Take medications as prescribed o Report any questions or  concerns to PharmD and/or provider(s)  Please see past updates related to this goal by clicking on the "Past Updates" button in the selected goal        Hyperlipidemia   LDL goal < 70  Lipid Panel     Component Value Date/Time   CHOL 173 09/10/2019 1029   TRIG 64 09/10/2019 1029   HDL 57 09/10/2019 1029   LDLCALC 104 (H) 09/10/2019 1029    Hepatic Function Latest Ref Rng & Units 01/14/2020 11/17/2019 09/10/2019  Total Protein 6.0 - 8.5 g/dL 7.2 - 6.9  Albumin 3.6 - 4.6 g/dL 4.4 4.1 4.2  AST 0 - 40 IU/L 18 - 20  ALT 0 -  44 IU/L 8 - 13  Alk Phosphatase 44 - 121 IU/L 92 - 85  Total Bilirubin 0.0 - 1.2 mg/dL 0.4 - 0.3     The ASCVD Risk score ([place]., et al., 2013) failed to calculate for the following reasons:   The 2013 ASCVD risk score is only valid for ages 83 to 54   Patient has failed these meds in past: N/A Patient is currently uncontrolled on the following medications:  . Pravastatin 40 mg daily    Aspirin EC 81 mg daily  We discussed:   Diet and exercise extensively   Pt says that he tries to eat right  Oatmeal, coffee, salads  Baked chicken and fish mostly  Snacks on Nabs, but not a lot of sweets  Drinks 3-4 bottles of water daily he does not drink any soda  Exercise extensively  Physical therapy gave him exercises to do  When he is sitting he is fine, but when he stands up he doesn't have good balance, gets dizzy and sometimes almost falls  Recommended using the stationary bike because of patient's balance issues  Plan Discuss w/ PCP changing pravastatin to atorvastatin 10m daily due to GFR of 19 and LDL above goal Recommend lipid panel at next PCP visit  Diabetes   Goal <7 Recent Relevant Labs: Lab Results  Component Value Date/Time   HGBA1C 6.8 (H) 01/14/2020 04:59 PM   HGBA1C 6.8 (H) 09/10/2019 10:29 AM   MICROALBUR 80 05/28/2019 12:29 PM   MICROALBUR 80 12/26/2017 04:58 PM   Kidney Function Lab Results  Component Value Date/Time    CREATININE 3.25 (H) 01/14/2020 04:59 PM   CREATININE 2.4 (A) 11/17/2019 12:00 AM   CREATININE 2.85 (H) 09/10/2019 10:29 AM   GFRNONAA 16 (L) 01/14/2020 04:59 PM   GFRAA 19 (L) 01/14/2020 04:59 PM   K 4.5 01/14/2020 04:59 PM   K 4.2 11/17/2019 12:00 AM   Checking BG: Daily  Recent FBG Readings: 112,115,120  Recent pre-meal BG readings:  Recent 2hr PP BG readings:   Recent HS BG readings:  Patient has failed these meds in past: Tanzeum, glimepiride, Toujeo, metformin, pioglitazone Patient is currently controlled on the following medications:   Tresiba Flextouch 23 units into skin at bedtime  Ozempic 0.5 mg once week  Last diabetic Foot exam: Sees podiatrist Last diabetic Eye exam: Sees ophthalmologist  We discussed:  Diet and exercise extensively  Pt denies drinking sodas and eating sweets  Coffee with creamer that only has 5 grams of sugar   Gets Ozempic and Tresiba for free through patient assistance  Signs, symptoms and treatments of Hypoglycemia:  He keeps peppermints in pocket will keep at least 4 with him   Plan Continue current medications    Hypertension   Goal <130/80  Office blood pressures are  BP Readings from Last 3 Encounters:  01/14/20 120/64  10/29/19 110/60  09/23/19 126/64   Patient has failed these meds in the past: Amlodipine, Edarbi, losartan, losartan-hctz Patient is currently controlled on the following medications:   Hydralazine 10 mg three times daily  Patient checks BP at home Never, does not have a monitor at home  We discussed:  Pt says he never has any trouble with BP  Doesn't use any salt on his food, just pepper  Plan Continue current medications   Chronic Kidney Disease Stage    Patient has failed these meds in past: none noted  Patient is currently controlled on the following medications:  . Furosemide  80 mg in the morning and Furosemide 40 mg in the evening . Hydralazine 10 mg three times per day  . Ferrous  Sulfate 325 mg take daily  We discussed:    Pt is seen by provider from [place] last time he went his kidney was at 74   Plan  Continue current medications   Neuropathy   Patient has failed these meds in past: Gabapentin  Patient is currently controlled on the following medications:   Pregabalin 25 mg daily  We discussed:    Patient reports that his neuropathy bothers him at night when he is trying to sleep. During the day it is not severe.   He only gets some relief by sitting on the side of the bed 15-20 minutes for relief.   Plan Continue current medications  Discuss increasing Lyrica dose with PCP to 11m once daily If pt is going to continue Lyrica, contact PAP regarding refill   Constipation   Patient has failed these meds in past: Senna-docusate, Linzess, Lactulose Patient is currently controlled on the following medications:  Miralax daily  We discussed:   Pt states this works well for him. He gets OTC and takes as directed on package  Plan Continue current medications  Over the Counter Medications   Patient is currently  on the following medications:  . Ferrous sulfate 325 mg twice daily . Multivitamin daily  We discussed:    Iron prescribed by kidney doctor  Pt stopped taking his Vitamin D supplement, no Vitamin D level on file  Plan Continue current medications  Recommend Vitamin D level at next PCP visit  Vaccines   Reviewed and discussed patient's vaccination history.    Immunization History  Administered Date(s) Administered  . Fluad Quad(high Dose 65+) 09/23/2019  . Influenza, High Dose Seasonal PF 11/21/2017, 08/29/2018  . PFIZER(Purple Top)SARS-COV-2 Vaccination 02/01/2019, 03/06/2019, 11/08/2019  . Pneumococcal Polysaccharide-23 12/10/1998   Provided patient education regarding Shingrix   Plan Recommended patient receive Prevnar13 followed by Shingrix vaccines series 1 month later at pharmacy I recommend that  patient get on April 6th 2022.   Medication Management   Pt uses [facility] for all medications Uses pill box? Yes Pt endorses 100% compliance  We discussed:  Importance of taking medication daily as directed  Plan Continue current medication management strategy      Follow up: 2 month phone visit  VOrlando Penner PharmD Clinical Pharmacist [facility] [phone]

## 2020-02-04 ENCOUNTER — Other Ambulatory Visit: Payer: Self-pay

## 2020-02-04 ENCOUNTER — Ambulatory Visit: Payer: Medicare Other | Admitting: Podiatry

## 2020-02-04 ENCOUNTER — Encounter: Payer: Self-pay | Admitting: Podiatry

## 2020-02-04 DIAGNOSIS — B351 Tinea unguium: Secondary | ICD-10-CM | POA: Diagnosis not present

## 2020-02-04 DIAGNOSIS — M79675 Pain in left toe(s): Secondary | ICD-10-CM

## 2020-02-04 DIAGNOSIS — L84 Corns and callosities: Secondary | ICD-10-CM

## 2020-02-04 DIAGNOSIS — N179 Acute kidney failure, unspecified: Secondary | ICD-10-CM | POA: Diagnosis not present

## 2020-02-04 DIAGNOSIS — I739 Peripheral vascular disease, unspecified: Secondary | ICD-10-CM

## 2020-02-04 DIAGNOSIS — N183 Chronic kidney disease, stage 3 unspecified: Secondary | ICD-10-CM | POA: Diagnosis not present

## 2020-02-04 DIAGNOSIS — E1142 Type 2 diabetes mellitus with diabetic polyneuropathy: Secondary | ICD-10-CM

## 2020-02-04 DIAGNOSIS — M79674 Pain in right toe(s): Secondary | ICD-10-CM

## 2020-02-04 NOTE — Progress Notes (Signed)
This patient returns to my office for at risk foot care.  This patient requires this care by a professional since this patient will be at risk due to having acute renal failure and diabetic neuropathy. This patient is unable to cut nails himself since the patient cannot reach his nails.These nails are painful walking and wearing shoes. Patient also states there is a painful callus right foot when he walks. This patient presents for at risk foot care today.  General Appearance  Alert, conversant and in no acute stress.  Vascular  Dorsalis pedis and posterior tibial  pulses are weakly  palpable  bilaterally.  Capillary return is within normal limits  bilaterally. Cold feet  bilaterally.  Neurologic  Senn-Weinstein monofilament wire test within normal limits  bilaterally. Muscle power within normal limits bilaterally.  Nails Thick disfigured discolored nails with subungual debris  from hallux to fifth toes bilaterally. No evidence of bacterial infection or drainage bilaterally.  Orthopedic  No limitations of motion  feet .  No crepitus or effusions noted.  No bony pathology or digital deformities noted.  Skin  normotropic skin with no porokeratosis noted bilaterally.  No signs of infections or ulcers noted.  Callus on medial aspect right heel   Onychomycosis  Pain in right toes  Pain in left toes  Callus right foot  Consent was obtained for treatment procedures.   Mechanical debridement of nails 1-5  bilaterally performed with a nail nipper.  Filed with dremel without incident. Debrided callus with # 15 blade and dremel tool.   Return office visit   3 months                   Told patient to return for periodic foot care and evaluation due to potential at risk complications.   Gardiner Barefoot DPM

## 2020-02-05 ENCOUNTER — Telehealth: Payer: Self-pay

## 2020-02-05 NOTE — Chronic Care Management (AMB) (Signed)
° ° °Chronic Care Management °Pharmacy Assistant  ° °Name: Justin Holloway  MRN: [id number] DOB: 12/02/1929 ° °Reason for Encounter: Medication Review ° ° °PCP : Sanders, Robyn, MD ° °Allergies:   °Allergies  °Allergen Reactions  °• Gabapentin Itching  ° ° °Medications: °Outpatient Encounter Medications as of 02/05/2020  °Medication Sig  °• Ascorbic Acid (VITAMIN C) 1000 MG tablet Take 1,000 mg by mouth daily.   °• aspirin EC 81 MG tablet Take 81 mg by mouth every evening.  °• Blood Glucose Monitoring Suppl (ONE TOUCH ULTRA 2) w/Device KIT Use as directed to check blood sugars 2 times per day dx: e11.22  °• cholecalciferol (VITAMIN D) 1000 units tablet Take 2,000 Units by mouth daily.   °• ciprofloxacin (CIPRO) 500 MG tablet Take 500 mg by mouth 2 (two) times daily.  °• COMBIGAN 0.2-0.5 % ophthalmic solution   °• ferrous sulfate 325 (65 FE) MG tablet TAKE 1 TABLET BY MOUTH DAILY  °• furosemide (LASIX) 80 MG tablet TAKE 1 TABLET BY MOUTH IN  THE MORNING AND ONE-HALF  TABLET BY MOUTH IN THE  EARLY EVENING (Patient taking differently: 80 mg 2 (two) times daily. TAKE 1 TABLET BY MOUTH IN  THE MORNING AND ONE-HALF  TABLET BY MOUTH IN THE  EARLY EVENING)  °• glucose blood (ONETOUCH ULTRA) test strip USE STRIPS TO TEST BLOOD  SUGAR 3 TIMES DAILY 1 STRIP PER TEST  °• hydrALAZINE (APRESOLINE) 10 MG tablet Take 10 mg by mouth 3 (three) times daily.  °• hydrALAZINE (APRESOLINE) 25 MG tablet TAKE 1 TABLET BY MOUTH 3  TIMES DAILY (Patient taking differently: 10 mg/kg 3 (three) times daily. Dosage change per Dr. Bhandari)  °• insulin degludec (TRESIBA) 200 UNIT/ML FlexTouch Pen Inject 23 Units into the skin at bedtime.   °• Lancets (ONETOUCH DELICA PLUS LANCET33G) MISC 100 each by Does not apply route in the morning, at noon, and at bedtime.  °• LUMIGAN 0.01 % SOLN Place 1 drop into both eyes 2 (two) times daily.   °• Multiple Vitamin (MULTIVITAMIN WITH MINERALS) TABS tablet Take 1 tablet by mouth daily.  °• polyethylene glycol  powder (GLYCOLAX/MIRALAX) 17 GM/SCOOP powder Take 17 g by mouth daily.   °• pravastatin (PRAVACHOL) 40 MG tablet TAKE 1 TABLET BY MOUTH  DAILY  °• pregabalin (LYRICA) 25 MG capsule Take 1 capsule (25 mg total) by mouth at bedtime as needed.  °• Semaglutide,0.25 or 0.5MG/DOS, (OZEMPIC, 0.25 OR 0.5 MG/DOSE,) 2 MG/1.5ML SOPN Inject 0.5 mg into the skin once a week.  °• sildenafil (REVATIO) 20 MG tablet Take 1 tablet  by mouth as needed. MAX 3 TO 5 TABLETS PER DAY  °• tamsulosin (FLOMAX) 0.4 MG CAPS capsule Take 0.4 mg by mouth daily.  ° °No facility-administered encounter medications on file as of 02/05/2020.  ° ° °Current Diagnosis: °Patient Active Problem List  ° Diagnosis Date Noted  °• Secondary renal hyperparathyroidism (HCC) 01/14/2020  °• Type 2 diabetes mellitus with stage 4 chronic kidney disease, with long-term current use of insulin (HCC) 09/10/2019  °• Peripheral vascular disease, unspecified (HCC) 05/28/2019  °• Diabetic neuropathy (HCC) 02/18/2019  °• Left upper quadrant pain 09/04/2018  °• Acute midline low back pain without sciatica 09/04/2018  °• CKD (chronic kidney disease) stage 4, GFR 15-29 ml/min (HCC) 11/21/2017  °• Hypertensive nephropathy 11/21/2017  °• Right foot pain 11/21/2017  °• Vertigo 09/09/2017  °• BPPV (benign paroxysmal positional vertigo), right 09/06/2017  °• Sensorineural hearing loss (SNHL), bilateral 09/06/2017  °•   Ataxia 02/06/2017  °• UTI (lower urinary tract infection) 08/07/2015  °• Dizziness 08/07/2015  °• Acute renal failure superimposed on stage 3 chronic kidney disease (HCC) 08/07/2015  °• Hyperkalemia 08/07/2015  °• Left inguinal hernia 05/15/2013  °• Diabetes mellitus without complication (HCC) 03/08/2006  °• HLD (hyperlipidemia) 03/08/2006  °• HYPERTENSION, BENIGN SYSTEMIC 03/08/2006  °• IMPOTENCE, 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  °• PROTEINURIA 03/08/2006  ° ° ° °Follow-Up:  Pharmacist Review-Reviewed chart and Adherence Measures. Per insurance data; the patient is adherent to  Pravastatin 40 MG at 94%. There is no PDR measure level for Ozempic due to patient receiving through [employer] patient assistance program. ° °[name], CPP Notified. ° °Candice Thomas, CMA °Clinical Pharmacist Assistant °[phone] ° °

## 2020-02-08 NOTE — Patient Instructions (Signed)
CARE PLAN ENTRY (see longitudinal plan of care for additional care plan information)  Current Barriers:  . Chronic Disease Management support, education, and care coordination needs related to Hypertension, Hyperlipidemia, and Diabetes   Hypertension BP Readings from Last 3 Encounters:  05/28/19 118/64  05/28/19 118/64  05/21/19 132/69   . Pharmacist Clinical Goal(s): o Over the next 180 days, patient will work with PharmD and providers to maintain BP goal <130/80 . Current regimen:  o Hydralazine 10mg  three times daily  . Interventions: o Dietary and exercise recommendations provided . Patient self care activities - Over the next 90 days, patient will: o Check BP if symptomatic, document, and provide at future appointments o Ensure daily salt intake < 2300 mg/day o Try to exercise for 30 minutes 5 times weekly, 150 minutes per week total  Hyperlipidemia Lab Results  Component Value Date/Time   LDLCALC 102 (H) 02/26/2019 10:38 AM   . Pharmacist Clinical Goal(s): o Over the next 90 days, patient will work with PharmD and providers to achieve LDL goal < 70 . Current regimen:  o Pravastatin 40mg  daily . Interventions: o Discuss with PCP, changing to atorvastatin 40mg  daily due to LDL above goal and kidney function o Provided dietary and exercise recommendations . Patient self care activities - Over the next 90 days, patient will: o Start atorvastatin 10 mg daily, if PCP agrees to switch medication o Try to exercise for 30 minutes 5 times weekly, 150 minutes per week total  Diabetes Lab Results  Component Value Date/Time   HGBA1C 6.7 (H) 06/10/2019 10:00 AM   HGBA1C 7.0 (H) 02/26/2019 10:38 AM   . Pharmacist Clinical Goal(s): o Over the next 180 days, patient will work with PharmD and providers to maintain A1c goal <7% . Current regimen:  o Tresiba 23 units nightly o Ozempic 0.5mg  weekly . Interventions: o Completed and faxed refill/reorder form for [place] and Ozempic  to [employer] patient assistance program - Determined patient received sample at last office visit o Will discuss increasing Lyrica to 50mg  daily with PCP for neuropathy o Provided dietary and exercise recommendations . Patient self care activities - Over the next 90 days, patient will: o Check blood sugar once daily, document, and provide at future appointments o Contact provider with any episodes of hypoglycemia o Increase Lyrica to 50mg  daily if PCP agrees o Try to exercise for 30 minutes 5 times weekly, 150 minutes per week total  Medication management . Pharmacist Clinical Goal(s): o Over the next 90 days, patient will work with PharmD and providers to maintain optimal medication adherence . Current pharmacy: [facility] . Interventions o Comprehensive medication review performed. o Continue current medication management strategy . Patient self care activities - Over the next 90 days, patient will: o Focus on medication adherence by continued use of pill box o Take medications as prescribed o Report any questions or concerns to PharmD and/or provider(s)  Please see past updates related to this goal by clicking on the "Past Updates" button in the selected goal

## 2020-02-10 ENCOUNTER — Telehealth: Payer: Self-pay

## 2020-02-10 NOTE — Telephone Encounter (Signed)
I left the pt a message that I was returning his call.

## 2020-02-17 DIAGNOSIS — H527 Unspecified disorder of refraction: Secondary | ICD-10-CM | POA: Diagnosis not present

## 2020-02-17 DIAGNOSIS — H5201 Hypermetropia, right eye: Secondary | ICD-10-CM | POA: Diagnosis not present

## 2020-02-17 DIAGNOSIS — H5212 Myopia, left eye: Secondary | ICD-10-CM | POA: Diagnosis not present

## 2020-02-17 DIAGNOSIS — H401133 Primary open-angle glaucoma, bilateral, severe stage: Secondary | ICD-10-CM | POA: Diagnosis not present

## 2020-02-17 DIAGNOSIS — H52223 Regular astigmatism, bilateral: Secondary | ICD-10-CM | POA: Diagnosis not present

## 2020-02-23 ENCOUNTER — Other Ambulatory Visit: Payer: Self-pay

## 2020-02-23 MED ORDER — PRAVASTATIN SODIUM 40 MG PO TABS
40.0000 mg | ORAL_TABLET | Freq: Every day | ORAL | 3 refills | Status: DC
Start: 2020-02-23 — End: 2021-03-01

## 2020-02-25 ENCOUNTER — Telehealth: Payer: Medicare Other

## 2020-02-25 ENCOUNTER — Ambulatory Visit (INDEPENDENT_AMBULATORY_CARE_PROVIDER_SITE_OTHER): Payer: Medicare Other

## 2020-02-25 DIAGNOSIS — I129 Hypertensive chronic kidney disease with stage 1 through stage 4 chronic kidney disease, or unspecified chronic kidney disease: Secondary | ICD-10-CM

## 2020-02-25 DIAGNOSIS — E1141 Type 2 diabetes mellitus with diabetic mononeuropathy: Secondary | ICD-10-CM

## 2020-02-25 DIAGNOSIS — Z794 Long term (current) use of insulin: Secondary | ICD-10-CM | POA: Diagnosis not present

## 2020-02-25 DIAGNOSIS — N184 Chronic kidney disease, stage 4 (severe): Secondary | ICD-10-CM | POA: Diagnosis not present

## 2020-02-25 DIAGNOSIS — E1122 Type 2 diabetes mellitus with diabetic chronic kidney disease: Secondary | ICD-10-CM

## 2020-02-25 DIAGNOSIS — E785 Hyperlipidemia, unspecified: Secondary | ICD-10-CM | POA: Diagnosis not present

## 2020-02-25 DIAGNOSIS — E559 Vitamin D deficiency, unspecified: Secondary | ICD-10-CM

## 2020-02-26 NOTE — Patient Instructions (Signed)
Goals Addressed      Patient Stated   .  Maintain adequate kidney function (pt-stated)   On track     Timeframe:  Long-Range Goal Priority:  High Start Date:  01/15/20                         Expected End Date: 08/04/20   Next Follow up date: 06/04/20  Over the next 180 days, patient will:  . Continue to self administer medications as prescribed . Attend all scheduled provider appointments . Call pharmacy for medication refills . Call provider office for new concerns or questions . Keep all MD f/u appointments with Dr. Carolin Sicks as scheduled  . Adhere to water intake as directed by kidney doctor                 Other   .  Medication Assistance with Lyrica   On track     Timeframe:  Short-Term Goal Priority:  High Start Date:  02/25/20                           Expected End Date:  06/04/20    Next Follow Up Date: 06/04/20  Over the next 90 days, patient will:  . Work with embedded Pharm D for medication assistance with cost of Lyrica

## 2020-02-26 NOTE — Chronic Care Management (AMB) (Signed)
Chronic Care Management   CCM RN Visit Note  02/25/2020 Name: Justin Holloway MRN: [id number] DOB: [birth year]  Subjective: Justin Holloway is a 87 y.o. year old male who is a primary care patient of [name], MD. The care management team was consulted for assistance with disease management and care coordination needs.    Engaged with patient by telephone for follow up visit in response to provider referral for case management and/or care coordination services.   Consent to Services:  The patient was given information about Chronic Care Management services, agreed to services, and gave verbal consent prior to initiation of services.  Please see initial visit note for detailed documentation.   Patient agreed to services and verbal consent obtained.   Assessment: Review of patient past medical history, allergies, medications, health status, including review of consultants reports, laboratory and other test data, was performed as part of comprehensive evaluation and provision of chronic care management services.   SDOH (Social Determinants of Health) assessments and interventions performed:    CCM Care Plan  Allergies  Allergen Reactions  . Gabapentin Itching    Outpatient Encounter Medications as of 02/25/2020  Medication Sig  . Ascorbic Acid (VITAMIN C) 1000 MG tablet Take 1,000 mg by mouth daily.   [name] aspirin EC 81 MG tablet Take 81 mg by mouth every evening.  . Blood Glucose Monitoring Suppl (ONE TOUCH ULTRA 2) w/Device KIT Use as directed to check blood sugars 2 times per day dx: e11.22  . cholecalciferol (VITAMIN D) 1000 units tablet Take 2,000 Units by mouth daily.   . ciprofloxacin (CIPRO) 500 MG tablet Take 500 mg by mouth 2 (two) times daily.  . COMBIGAN 0.2-0.5 % ophthalmic solution   . ferrous sulfate 325 (65 FE) MG tablet TAKE 1 TABLET BY MOUTH DAILY  . furosemide (LASIX) 80 MG tablet TAKE 1 TABLET BY MOUTH IN  THE MORNING AND ONE-HALF  TABLET BY MOUTH IN THE   EARLY EVENING (Patient taking differently: 80 mg 2 (two) times daily. TAKE 1 TABLET BY MOUTH IN  THE MORNING AND ONE-HALF  TABLET BY MOUTH IN THE  EARLY EVENING)  . glucose blood (ONETOUCH ULTRA) test strip USE STRIPS TO TEST BLOOD  SUGAR 3 TIMES DAILY 1 STRIP PER TEST  . hydrALAZINE (APRESOLINE) 10 MG tablet Take 10 mg by mouth 3 (three) times daily.  . hydrALAZINE (APRESOLINE) 25 MG tablet TAKE 1 TABLET BY MOUTH 3  TIMES DAILY (Patient taking differently: 10 mg/kg 3 (three) times daily. Dosage change per Dr. Carolin Sicks)  . insulin degludec (TRESIBA) 200 UNIT/ML FlexTouch Pen Inject 23 Units into the skin at bedtime.   . Lancets (ONETOUCH DELICA PLUS [id number]) MISC 100 each by Does not apply route in the morning, at noon, and at bedtime.  [name] LUMIGAN 0.01 % SOLN Place 1 drop into both eyes 2 (two) times daily.   . Multiple Vitamin (MULTIVITAMIN WITH MINERALS) TABS tablet Take 1 tablet by mouth daily.  . polyethylene glycol powder (GLYCOLAX/MIRALAX) 17 GM/SCOOP powder Take 17 g by mouth daily.   . pravastatin (PRAVACHOL) 40 MG tablet Take 1 tablet (40 mg total) by mouth daily.  . pregabalin (LYRICA) 25 MG capsule Take 1 capsule (25 mg total) by mouth at bedtime as needed. (Patient not taking: Reported on 02/25/2020)  . Semaglutide,0.25 or 0.5MG/DOS, (OZEMPIC, 0.25 OR 0.5 MG/DOSE,) 2 MG/1.5ML SOPN Inject 0.5 mg into the skin once a week.  . sildenafil (REVATIO) 20 MG tablet Take 1 tablet  by  mouth as needed. MAX 3 TO 5 TABLETS PER DAY  . tamsulosin (FLOMAX) 0.4 MG CAPS capsule Take 0.4 mg by mouth daily.   No facility-administered encounter medications on file as of 02/25/2020.    Patient Active Problem List   Diagnosis Date Noted  . Secondary renal hyperparathyroidism ([place]) 01/14/2020  . Type 2 diabetes mellitus with stage 4 chronic kidney disease, with long-term current use of insulin ([place]) 09/10/2019  . Peripheral vascular disease, unspecified ([place]) 05/28/2019  . Diabetic neuropathy ([place])  02/18/2019  . Left upper quadrant pain 09/04/2018  . Acute midline low back pain without sciatica 09/04/2018  . CKD (chronic kidney disease) stage 4, GFR 15-29 ml/min (HCC) 11/21/2017  . Hypertensive nephropathy 11/21/2017  . Right foot pain 11/21/2017  . Vertigo 09/09/2017  . BPPV (benign paroxysmal positional vertigo), right 09/06/2017  . Sensorineural hearing loss (SNHL), bilateral 09/06/2017  . Ataxia 02/06/2017  . UTI (lower urinary tract infection) 08/07/2015  . Dizziness 08/07/2015  . Acute renal failure superimposed on stage 3 chronic kidney disease ([place]) 08/07/2015  . Hyperkalemia 08/07/2015  . Left inguinal hernia 05/15/2013  . Diabetes mellitus without complication ([place]) 11/05/2534  . HLD (hyperlipidemia) 03/08/2006  . HYPERTENSION, BENIGN SYSTEMIC 03/08/2006  . 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 PROTEINURIA 03/08/2006    Conditions to be addressed/monitored:DM, CKD Stage 4, Hypertensive Nephropathy, Diabetic mononeuropathy associated with type 2 Diabetes, Vitamin D deficiency, HLD  Care Plan : General Plan of Care (Adult)  Updates made by Lynne Logan, RN since 02/26/2020 12:00 AM    Problem: Chronic Kidney Disease   Priority: High    Long-Range Goal: Maintain Adequate Kidney Function   Start Date: 01/15/2020  Expected End Date: 07/14/2020  Recent Progress: On track  Priority: High  Note:   Current Barriers:  [name] Knowledge Deficits related to disease process and treatment mangement for renal insufficiency/CKD . Chronic Disease Management support and education needs related to DM, CKD Stage 4, Hypertensive Nephropathy Nurse Case Manager Clinical Goal(s):  [name]  Over the next 180 days, patient will continue to work with the CCM team and PCP for disease education and support for improved Self Health management of stage 4 CKD CCM RN CM Interventions:  02/25/20 completed call with patient  . Evaluation of current treatment plan related to stage IV CKD and patient's adherence  to plan as established by provider . Determined patient completed a follow up visit with his Nephrologist on 01/22/20  . Determined Dr. Carolin Sicks repeated blood work for evaluation of patient's renal function; Discussed patient was informed his GFR was 21 with repeat labs . Determined Dr. Carolin Sicks recommended dosage changes to patient's Hydralazine and Furosemide, no fluid or dietary recommendations were given   . Educated on signs/symptoms suggestive of ESRD and AKI and when to call the doctor if needed  . Determined Dr. Carolin Sicks instructed patient to drink "3 bottles of water" daily  . Discussed next scheduled f/u with Dr. Carolin Sicks is scheduled in March  . Discussed plans with patient for ongoing care management follow up and provided patient with direct contact information for care management team Patient Goals/ Self Care Activities:  Over the next 180 days, patient will:  . Continue to self administer medications as prescribed . Attend all scheduled provider appointments . Call pharmacy for medication refills . Call provider office for new concerns or questions . Keep all MD f/u appointments with Dr. Carolin Sicks as scheduled  . Adhere to water intake as directed by kidney doctor  Next planned follow up date: 06/04/20   Care Plan : Wellness (Adult)  Updates made by Lynne Logan, RN since 02/26/2020 12:00 AM    Problem: Medication Adherence (Neuropathy)   Priority: High    Goal: Medication Adherence Maintained for Neuropathy   Start Date: 02/25/2020  Expected End Date: 06/04/2020  This Visit's Progress: On track  Priority: High  Note:   Current Barriers:   Ineffective Self Health Maintenance  Currently UNABLE TO independently self manage needs related to chronic health conditions.   Knowledge Deficits related to short term plan for care coordination needs and long term plans for chronic disease management needs Clinical Goal(s):  [name] Collaboration with [name], MD regarding  development and update of comprehensive plan of care as evidenced by provider attestation and co-signature . Inter-disciplinary care team collaboration (see longitudinal plan of care)  Over the next 90 days, patient will work with care management team to address care coordination and chronic disease management needs related to Disease Management  Educational Needs  Care Coordination  Medication Management and Education  Medication Assistance   Psychosocial Support   Interventions:  02/25/20 call completed with patient   Evaluation of current treatment plan related to Neuropathy self-management and patient's adherence to plan as established by provider.  Collaboration with [name], MD regarding development and update of comprehensive plan of care as evidenced by provider attestation       and co-signature  Inter-disciplinary care team collaboration (see longitudinal plan of care)  Determined patient is currently no longer taking Lyrica due to inability to afford this medication, he states it is not covered by Medicare  Determined he has used Gabapentin in the past w/o effectiveness and experienced bothersome SE  Sent in basket message to embedded Pharm D [name] requesting medication assistance with cost of Lyrica  Discussed plans with patient for ongoing care management follow up and provided patient with direct contact information for care management team Patient Goals/Self Care Activities:  Over the next 90 days, patient will:  . Work with embedded Pharm D for medication assistance with cost of Lyrica  Follow Up Plan: Telephone follow up appointment with care management team member scheduled for: 06/04/20    Plan:Telephone follow up appointment with care management team member scheduled for:  06/04/20  Barb Merino, RN, BSN, CCM Care Management Coordinator [place] [facility]  Direct Phone: [phone]

## 2020-03-03 ENCOUNTER — Telehealth: Payer: Self-pay

## 2020-03-03 NOTE — Chronic Care Management (AMB) (Addendum)
Chronic Care Management Pharmacy Assistant   Name: Justin Holloway  MRN: [id number] DOB: Dec 15, 1929  Reason for Encounter: Hypertension Adherence Call  Patient Questions:  1.  Have you seen any other providers since your last visit? Yes, 02/04/20-Justin Holloway, Justin Holloway, DPM (OV).     2.  Any changes in your medicines or health? No    PCP : [name], MD  Allergies:   Allergies  Allergen Reactions   Gabapentin Itching    Medications: Outpatient Encounter Medications as of 03/03/2020  Medication Sig   Ascorbic Acid (VITAMIN C) 1000 MG tablet Take 1,000 mg by mouth daily.    aspirin EC 81 MG tablet Take 81 mg by mouth every evening.   Blood Glucose Monitoring Suppl (ONE TOUCH ULTRA 2) w/Device KIT Use as directed to check blood sugars 2 times per day dx: e11.22   cholecalciferol (VITAMIN D) 1000 units tablet Take 2,000 Units by mouth daily.    ciprofloxacin (CIPRO) 500 MG tablet Take 500 mg by mouth 2 (two) times daily.   COMBIGAN 0.2-0.5 % ophthalmic solution    ferrous sulfate 325 (65 FE) MG tablet TAKE 1 TABLET BY MOUTH DAILY   furosemide (LASIX) 80 MG tablet TAKE 1 TABLET BY MOUTH IN  THE MORNING AND ONE-HALF  TABLET BY MOUTH IN THE  EARLY EVENING (Patient taking differently: 80 mg 2 (two) times daily. TAKE 1 TABLET BY MOUTH IN  THE MORNING AND ONE-HALF  TABLET BY MOUTH IN THE  EARLY EVENING)   glucose blood (ONETOUCH ULTRA) test strip USE STRIPS TO TEST BLOOD  SUGAR 3 TIMES DAILY 1 STRIP PER TEST   hydrALAZINE (APRESOLINE) 10 MG tablet Take 10 mg by mouth 3 (three) times daily.   hydrALAZINE (APRESOLINE) 25 MG tablet TAKE 1 TABLET BY MOUTH 3  TIMES DAILY (Patient taking differently: 10 mg/kg 3 (three) times daily. Dosage change per Dr. Carolin Sicks)   insulin degludec (TRESIBA) 200 UNIT/ML FlexTouch Pen Inject 23 Units into the skin at bedtime.    Lancets (ONETOUCH DELICA PLUS [id number]) MISC 100 each by Does not apply route in the morning, at noon, and at bedtime.   LUMIGAN 0.01  % SOLN Place 1 drop into both eyes 2 (two) times daily.    Multiple Vitamin (MULTIVITAMIN WITH MINERALS) TABS tablet Take 1 tablet by mouth daily.   polyethylene glycol powder (GLYCOLAX/MIRALAX) 17 GM/SCOOP powder Take 17 g by mouth daily.    pravastatin (PRAVACHOL) 40 MG tablet Take 1 tablet (40 mg total) by mouth daily.   pregabalin (LYRICA) 25 MG capsule Take 1 capsule (25 mg total) by mouth at bedtime as needed. (Patient not taking: Reported on 02/25/2020)   Semaglutide,0.25 or 0.5MG/DOS, (OZEMPIC, 0.25 OR 0.5 MG/DOSE,) 2 MG/1.5ML SOPN Inject 0.5 mg into the skin once a week.   sildenafil (REVATIO) 20 MG tablet Take 1 tablet  by mouth as needed. MAX 3 TO 5 TABLETS PER DAY   tamsulosin (FLOMAX) 0.4 MG CAPS capsule Take 0.4 mg by mouth daily.   No facility-administered encounter medications on file as of 03/03/2020.    Current Diagnosis: Patient Active Problem List   Diagnosis Date Noted   Secondary renal hyperparathyroidism ([place]) 01/14/2020   Type 2 diabetes mellitus with stage 4 chronic kidney disease, with long-term current use of insulin ([place]) 09/10/2019   Peripheral vascular disease, unspecified ([place]) 05/28/2019   Diabetic neuropathy ([place]) 02/18/2019   Left upper quadrant pain 09/04/2018   Acute midline low back pain without sciatica 09/04/2018  CKD (chronic kidney disease) stage 4, GFR 15-29 ml/min (HCC) 11/21/2017   Hypertensive nephropathy 11/21/2017   Right foot pain 11/21/2017   Vertigo 09/09/2017   BPPV (benign paroxysmal positional vertigo), right 09/06/2017   Sensorineural hearing loss (SNHL), bilateral 09/06/2017   Ataxia 02/06/2017   UTI (lower urinary tract infection) 08/07/2015   Dizziness 08/07/2015   Acute renal failure superimposed on stage 3 chronic kidney disease ([place]) 08/07/2015   Hyperkalemia 08/07/2015   Left inguinal hernia 05/15/2013   Diabetes mellitus without complication ([place]) 16/10/9602   HLD (hyperlipidemia) 03/08/2006   HYPERTENSION, BENIGN  SYSTEMIC 03/08/2006   IMPOTENCE, 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   PROTEINURIA 03/08/2006   Reviewed chart prior to disease state call. Spoke with patient regarding BP  Recent Office Vitals: BP Readings from Last 3 Encounters:  01/14/20 120/64  10/29/19 110/60  09/23/19 126/64   Pulse Readings from Last 3 Encounters:  01/14/20 98  10/29/19 96  09/23/19 93    Wt Readings from Last 3 Encounters:  01/14/20 213 lb (96.6 kg)  10/29/19 221 lb 6.4 oz (100.4 kg)  09/23/19 223 lb 3.2 oz (101.2 kg)     Kidney Function Lab Results  Component Value Date/Time   CREATININE 3.25 (H) 01/14/2020 04:59 PM   CREATININE 2.4 (A) 11/17/2019 12:00 AM   CREATININE 2.85 (H) 09/10/2019 10:29 AM   GFRNONAA 16 (L) 01/14/2020 04:59 PM   GFRAA 19 (L) 01/14/2020 04:59 PM    BMP Latest Ref Rng & Units 01/14/2020 11/17/2019 09/10/2019  Glucose 65 - 99 mg/dL 114(H) - 86  BUN 8 - 27 mg/dL 51(H) 48(A) 54(H)  Creatinine 0.76 - 1.27 mg/dL 3.25(H) 2.4(A) 2.85(H)  BUN/Creat Ratio 10 - 24 16 - 19  Sodium 134 - 144 mmol/L 140 136(A) 137  Potassium 3.5 - 5.2 mmol/L 4.5 4.2 4.8  Chloride 96 - 106 mmol/L 100 103 100  CO2 20 - 29 mmol/L 21 24(A) 21  Calcium 8.6 - 10.2 mg/dL 9.5 9.2 9.3    Current antihypertensive regimen:  Hydralazine 54m three times daily  How often are you checking your Blood Pressure?  Patient reports he does not check and does not have a BP Cuff.    Current home BP readings: none  What recent interventions/DTPs have been made by any provider to improve Blood Pressure control since last CPP Visit:  Patient reports he is taking his medications as directed.  Any recent hospitalizations or ED visits since last visit with CPP? No   What diet changes have been made to improve Blood Pressure Control?  Patient reports he does use any salt in foods. Patient stated he last weighed 214 but now he weighs 191.  Patient voiced he may eat oatmeal or scrambled eggs sometimes boiled for breakfast. Patient stated he  will eat a chicken sandwich for lunch. For dinner patient states he typically eats baked chicken, with vegetables such as string beans or collards greens. Patient reports he is drinking a lot of water.  What exercise is being done to improve your Blood Pressure Control?  Patient reports he does light stretching and walking.  Adherence Review: Is the patient currently on ACE/ARB medication? No   Does the patient have >5 day gap between last estimated fill dates? No    Goals Addressed             This Visit's Progress    Pharmacy Care Plan   On track    CARE PLAN ENTRY (see longitudinal plan of care for additional care  plan information)  Current Barriers:  Chronic Disease Management support, education, and care coordination needs related to Hypertension, Hyperlipidemia, and Diabetes   Hypertension BP Readings from Last 3 Encounters:  05/28/19 118/64  05/28/19 118/64  05/21/19 132/69   Pharmacist Clinical Goal(s): Over the next 180 days, patient will work with PharmD and providers to maintain BP goal <130/80 Current regimen:  Hydralazine 44m three times daily  Interventions: Dietary and exercise recommendations provided Patient self care activities - Over the next 90 days, patient will: Check BP if symptomatic, document, and provide at future appointments Ensure daily salt intake < 2300 mg/day Try to exercise for 30 minutes 5 times weekly, 150 minutes per week total  Hyperlipidemia Lab Results  Component Value Date/Time   LDLCALC 102 (H) 02/26/2019 10:38 AM   Pharmacist Clinical Goal(s): Over the next 90 days, patient will work with PharmD and providers to achieve LDL goal < 70 Current regimen:  Pravastatin 432mdaily Interventions: Discuss with PCP, changing to atorvastatin 4030maily due to LDL above goal and kidney function Provided dietary and exercise recommendations Patient self care activities - Over the next 90 days, patient will: Start atorvastatin 10  mg daily, if PCP agrees to switch medication Try to exercise for 30 minutes 5 times weekly, 150 minutes per week total  Diabetes Lab Results  Component Value Date/Time   HGBA1C 6.7 (H) 06/10/2019 10:00 AM   HGBA1C 7.0 (H) 02/26/2019 10:38 AM   Pharmacist Clinical Goal(s): Over the next 180 days, patient will work with PharmD and providers to maintain A1c goal <7% Current regimen:  Tresiba 23 units nightly Ozempic 0.5mg52mekly Interventions: Completed and faxed refill/reorder form for [place] Ozempic to [employer] assistance program Determined patient received sample at last office visit Will discuss increasing Lyrica to 50mg61mly with PCP for neuropathy Provided dietary and exercise recommendations Patient self care activities - Over the next 90 days, patient will: Check blood sugar once daily, document, and provide at future appointments Contact provider with any episodes of hypoglycemia Increase Lyrica to 50mg 62my if PCP agrees Try to exercise for 30 minutes 5 times weekly, 150 minutes per week total  Medication management Pharmacist Clinical Goal(s): Over the next 90 days, patient will work with PharmD and providers to maintain optimal medication adherence Current pharmacy: [facility] Interventions Comprehensive medication review performed. Continue current medication management strategy Patient self care activities - Over the next 90 days, patient will: Focus on medication adherence by continued use of pill box Take medications as prescribed Report any questions or concerns to PharmD and/or provider(s)  Please see past updates related to this goal by clicking on the "Past Updates" button in the selected goal          Follow-Up:  Pharmacist Review-Patient reports he is running low of his Ozempic and [place] the patient if he renewed his patient assistance application for this year. The patient voiced he has already been to PCP's  office to complete application. The patient also stated his last shipment order was last year he does not recall the exact month or date. Told the patient he will [facility] his next delivery of Ozempic and [place] also attempt to follow up with [employer] to inquire next shipment date. The patient verbalized understanding.  VallieOrlando PennerNotified.  CandicRaynelle HighlandClSpringboroacist Assistant [phone] Time: 24 minutes I have reviewed the care management  and care coordination activities outlined in this encounter and I am certifying that I agree with the content of this note. No further action required.  2 minutes spent in review, coordination, and documentation.  Mayford Knife, [facility] 03/08/20 8:35 AM

## 2020-03-10 ENCOUNTER — Other Ambulatory Visit: Payer: Self-pay | Admitting: Internal Medicine

## 2020-03-10 MED ORDER — PREGABALIN 25 MG PO CAPS: 25.0000 mg | ORAL_CAPSULE | Freq: Every evening | ORAL | 1 refills | Status: DC | PRN

## 2020-03-12 ENCOUNTER — Other Ambulatory Visit: Payer: Self-pay | Admitting: Internal Medicine

## 2020-03-12 DIAGNOSIS — N184 Chronic kidney disease, stage 4 (severe): Secondary | ICD-10-CM | POA: Diagnosis not present

## 2020-03-15 NOTE — Telephone Encounter (Signed)
Pregabalin refill

## 2020-04-14 ENCOUNTER — Ambulatory Visit: Payer: Medicare Other | Admitting: Internal Medicine

## 2020-04-14 DIAGNOSIS — N2581 Secondary hyperparathyroidism of renal origin: Secondary | ICD-10-CM | POA: Diagnosis not present

## 2020-04-14 DIAGNOSIS — D631 Anemia in chronic kidney disease: Secondary | ICD-10-CM | POA: Diagnosis not present

## 2020-04-14 DIAGNOSIS — N184 Chronic kidney disease, stage 4 (severe): Secondary | ICD-10-CM | POA: Diagnosis not present

## 2020-04-14 DIAGNOSIS — I129 Hypertensive chronic kidney disease with stage 1 through stage 4 chronic kidney disease, or unspecified chronic kidney disease: Secondary | ICD-10-CM | POA: Diagnosis not present

## 2020-04-14 DIAGNOSIS — R609 Edema, unspecified: Secondary | ICD-10-CM | POA: Diagnosis not present

## 2020-04-19 ENCOUNTER — Ambulatory Visit (INDEPENDENT_AMBULATORY_CARE_PROVIDER_SITE_OTHER): Payer: Medicare Other | Admitting: Internal Medicine

## 2020-04-19 ENCOUNTER — Other Ambulatory Visit: Payer: Self-pay

## 2020-04-19 ENCOUNTER — Encounter: Payer: Self-pay | Admitting: Internal Medicine

## 2020-04-19 VITALS — BP 124/78 | HR 50 | Temp 97.4°F | Ht 65.0 in | Wt 211.8 lb

## 2020-04-19 DIAGNOSIS — N184 Chronic kidney disease, stage 4 (severe): Secondary | ICD-10-CM | POA: Diagnosis not present

## 2020-04-19 DIAGNOSIS — Z794 Long term (current) use of insulin: Secondary | ICD-10-CM

## 2020-04-19 DIAGNOSIS — E1122 Type 2 diabetes mellitus with diabetic chronic kidney disease: Secondary | ICD-10-CM | POA: Diagnosis not present

## 2020-04-19 DIAGNOSIS — E1141 Type 2 diabetes mellitus with diabetic mononeuropathy: Secondary | ICD-10-CM

## 2020-04-19 DIAGNOSIS — I129 Hypertensive chronic kidney disease with stage 1 through stage 4 chronic kidney disease, or unspecified chronic kidney disease: Secondary | ICD-10-CM

## 2020-04-19 DIAGNOSIS — Z6835 Body mass index (BMI) 35.0-35.9, adult: Secondary | ICD-10-CM

## 2020-04-19 NOTE — Progress Notes (Signed)
I,Katawbba Wiggins,acting as a Education administrator for [name], MD.,have documented all relevant documentation on the behalf of [name], MD,as directed by  [name], MD while in the presence of [name], MD.  This visit occurred during the SARS-CoV-2 public health emergency.  Safety protocols were in place, including screening questions prior to the visit, additional usage of staff PPE, and extensive cleaning of exam room while observing appropriate contact time as indicated for disinfecting solutions.  Subjective:     Patient ID: Justin Holloway , male    DOB: 11-12-1929 , 87 y.o.   MRN: [id number]   Chief Complaint  Patient presents with  . Diabetes  . Hypertension    HPI  He presents today for diabetes. He reports compliance with meds. He denies shortness of breath and palpitations. He reports compliance with meds and is happy to report he has remained abstinent from sodas!   Diabetes He presents for his follow-up diabetic visit. He has type 2 diabetes mellitus. His disease course has been stable. There are no hypoglycemic associated symptoms. Pertinent negatives for diabetes include no blurred vision and no chest pain. There are no hypoglycemic complications. Risk factors for coronary artery disease include dyslipidemia, hypertension, diabetes mellitus, male sex, sedentary lifestyle and obesity. He is compliant with treatment most of the time. He is following a generally healthy diet. He participates in exercise intermittently. Eye exam is not current.  Hypertension This is a chronic problem. The current episode started more than 1 year ago. The problem has been gradually improving since onset. The problem is controlled. Pertinent negatives include no blurred vision, chest pain or shortness of breath. Risk factors for coronary artery disease include diabetes mellitus, male gender, obesity and dyslipidemia. The current treatment provides moderate improvement. Hypertensive  end-organ damage includes kidney disease.     Past Medical History:  Diagnosis Date  . Arthritis   . CKD (chronic kidney disease), stage III ([place])   . Diabetes mellitus without complication ([place])   . Glaucoma    "blurred vision", see Dr Gershon Crane yearly  . Hyperlipidemia   . Hypertension   . TIA (transient ischemic attack)      Family History  Problem Relation Age of Onset  . Cancer Mother 76       breast  . COPD Father 69       Congestive heart failure     Current Outpatient Medications:  .  Ascorbic Acid (VITAMIN C) 1000 MG tablet, Take 1,000 mg by mouth daily. , Disp: , Rfl:  .  aspirin EC 81 MG tablet, Take 81 mg by mouth every evening., Disp: , Rfl:  .  COMBIGAN 0.2-0.5 % ophthalmic solution, , Disp: , Rfl:  .  ferrous sulfate 325 (65 FE) MG tablet, TAKE 1 TABLET BY MOUTH DAILY, Disp: , Rfl:  .  furosemide (LASIX) 80 MG tablet, TAKE 1 TABLET BY MOUTH IN  THE MORNING AND ONE-HALF  TABLET BY MOUTH IN THE  EARLY EVENING (Patient taking differently: 80 mg 2 (two) times daily. TAKE 1 TABLET BY MOUTH IN  THE MORNING AND ONE-HALF  TABLET BY MOUTH IN THE  EARLY EVENING), Disp: 135 tablet, Rfl: 3 .  hydrALAZINE (APRESOLINE) 10 MG tablet, Take 10 mg by mouth 3 (three) times daily., Disp: , Rfl:  .  insulin degludec (TRESIBA) 200 UNIT/ML FlexTouch Pen, Inject 23 Units into the skin at bedtime. , Disp: , Rfl:  .  Lancets (ONETOUCH DELICA PLUS [id number]) MISC, 100  each by Does not apply route in the morning, at noon, and at bedtime., Disp: 300 each, Rfl: 3 .  LUMIGAN 0.01 % SOLN, Place 1 drop into both eyes 2 (two) times daily. , Disp: , Rfl:  .  Multiple Vitamin (MULTIVITAMIN WITH MINERALS) TABS tablet, Take 1 tablet by mouth daily., Disp: , Rfl:  .  polyethylene glycol powder (GLYCOLAX/MIRALAX) 17 GM/SCOOP powder, Take 17 g by mouth daily. , Disp: , Rfl:  .  pravastatin (PRAVACHOL) 40 MG tablet, Take 1 tablet (40 mg total) by mouth daily., Disp: 90 tablet, Rfl: 3 .  pregabalin (LYRICA)  25 MG capsule, TAKE 1 CAPSULE(25 MG) BY MOUTH AT BEDTIME AS NEEDED, Disp: 90 capsule, Rfl: 1 .  Semaglutide,0.25 or 0.5MG/DOS, (OZEMPIC, 0.25 OR 0.5 MG/DOSE,) 2 MG/1.5ML SOPN, Inject 0.5 mg into the skin once a week., Disp: 1.5 mL, Rfl: 1 .  sildenafil (REVATIO) 20 MG tablet, Take 1 tablet  by mouth as needed. MAX 3 TO 5 TABLETS PER DAY, Disp: 10 tablet, Rfl: 1 .  tamsulosin (FLOMAX) 0.4 MG CAPS capsule, Take 0.4 mg by mouth daily., Disp: , Rfl:  .  Blood Glucose Monitoring Suppl (ONE TOUCH ULTRA 2) w/Device KIT, Use as directed to check blood sugars 2 times per day dx: e11.22, Disp: 1 kit, Rfl: 0 .  cholecalciferol (VITAMIN D) 1000 units tablet, Take 2,000 Units by mouth daily. , Disp: , Rfl:  .  ciprofloxacin (CIPRO) 500 MG tablet, Take 500 mg by mouth 2 (two) times daily., Disp: , Rfl:  .  glucose blood (ONETOUCH ULTRA) test strip, USE STRIPS TO TEST BLOOD  SUGAR 3 TIMES DAILY 1 STRIP PER TEST, Disp: 300 strip, Rfl: 3   Allergies  Allergen Reactions  . Gabapentin Itching     Review of Systems  Constitutional: Negative.   Eyes: Negative for blurred vision.  Respiratory: Negative.  Negative for shortness of breath.   Cardiovascular: Negative.  Negative for chest pain.  Gastrointestinal: Negative.   Neurological: Negative.   Psychiatric/Behavioral: Negative.      Today's Vitals   04/19/20 1011  BP: 124/78  Pulse: (!) 50  Temp: (!) 97.4 F (36.3 C)  TempSrc: Oral  Weight: 211 lb 12.8 oz (96.1 kg)  Height: _0  (1.651 m)   Body mass index is 35.25 kg/m.  Wt Readings from Last 3 Encounters:  04/19/20 211 lb 12.8 oz (96.1 kg)  01/14/20 213 lb (96.6 kg)  10/29/19 221 lb 6.4 oz (100.4 kg)   Objective:  Physical Exam Vitals and nursing note reviewed.  Constitutional:      Appearance: Normal appearance. He is obese.  HENT:     Head: Normocephalic and atraumatic.     Nose:     Comments: Masked     Mouth/Throat:     Comments: Masked  Cardiovascular:     Rate and Rhythm:  Normal rate and regular rhythm.     Heart sounds: Normal heart sounds.  Pulmonary:     Effort: Pulmonary effort is normal.     Breath sounds: Normal breath sounds.  Musculoskeletal:     Right lower leg: Edema present.     Left lower leg: Edema present.  Skin:    General: Skin is warm.  Neurological:     General: No focal deficit present.     Mental Status: He is alert.  Psychiatric:        Mood and Affect: Mood normal.         Assessment And Plan:  1. Type 2 diabetes mellitus with stage 4 chronic kidney disease, with long-term current use of insulin ([place]) Comments: I will check labs as listed below. He was congratulated on his lifestyle changes. He reports having labs w/ recent Renal appt, I will request records.  - Hemoglobin A1c  2. Hypertensive nephropathy Comments: Well controlled. He is encouraged to follow low sodium diet.   3. Diabetic mononeuropathy associated with type 2 diabetes mellitus ([place]) Comments: Unfortunately, he has had little improvement with Lyrica, 44m daily. I will check with [facility] to see if they have any treatment options available.   4. Class 2 severe obesity due to excess calories with serious comorbidity and body mass index (BMI) of 35.0 to 35.9 in adult ([facility] Comments: He is encouraged to aim for at least 150 minutes of exercise per week. Advised to aim for BMI less than 30 to decrease cardiac risk.    Patient was given opportunity to ask questions. Patient verbalized understanding of the plan and was able to repeat key elements of the plan. All questions were answered to their satisfaction.   I, RMaximino Greenland MD, have reviewed all documentation for this visit. The documentation on 04/19/20 for the exam, diagnosis, procedures, and orders are all accurate and complete.   IF YOU HAVE BEEN REFERRED TO A SPECIALIST, IT MAY TAKE 1-2 WEEKS TO SCHEDULE/PROCESS THE REFERRAL. IF YOU HAVE NOT HEARD FROM US/SPECIALIST IN TWO WEEKS, PLEASE GIVE  [place] CALL AT [phone].   THE PATIENT IS ENCOURAGED TO PRACTICE SOCIAL DISTANCING DUE TO THE COVID-19 PANDEMIC.

## 2020-04-19 NOTE — Patient Instructions (Signed)
Diabetes Mellitus and Foot Care Foot care is an important part of your health, especially when you have diabetes. Diabetes may cause you to have problems because of poor blood flow (circulation) to your feet and legs, which can cause your skin to:  Become thinner and drier.  Break more easily.  Heal more slowly.  Peel and crack. You may also have nerve damage (neuropathy) in your legs and feet, causing decreased feeling in them. This means that you may not notice minor injuries to your feet that could lead to more serious problems. Noticing and addressing any potential problems early is the best way to prevent future foot problems. How to care for your feet Foot hygiene  Wash your feet daily with warm water and mild soap. Do not use hot water. Then, pat your feet and the areas between your toes until they are completely dry. Do not soak your feet as this can dry your skin.  Trim your toenails straight across. Do not dig under them or around the cuticle. File the edges of your nails with an emery board or nail file.  Apply a moisturizing lotion or petroleum jelly to the skin on your feet and to dry, brittle toenails. Use lotion that does not contain alcohol and is unscented. Do not apply lotion between your toes.   Shoes and socks  Wear clean socks or stockings every day. Make sure they are not too tight. Do not wear knee-high stockings since they may decrease blood flow to your legs.  Wear shoes that fit properly and have enough cushioning. Always look in your shoes before you put them on to be sure there are no objects inside.  To break in new shoes, wear them for just a few hours a day. This prevents injuries on your feet. Wounds, scrapes, corns, and calluses  Check your feet daily for blisters, cuts, bruises, sores, and redness. If you cannot see the bottom of your feet, use a mirror or ask someone for help.  Do not cut corns or calluses or try to remove them with medicine.  If you  find a minor scrape, cut, or break in the skin on your feet, keep it and the skin around it clean and dry. You may clean these areas with mild soap and water. Do not clean the area with peroxide, alcohol, or iodine.  If you have a wound, scrape, corn, or callus on your foot, look at it several times a day to make sure it is healing and not infected. Check for: ? Redness, swelling, or pain. ? Fluid or blood. ? Warmth. ? Pus or a bad smell.   General tips  Do not cross your legs. This may decrease blood flow to your feet.  Do not use heating pads or hot water bottles on your feet. They may burn your skin. If you have lost feeling in your feet or legs, you may not know this is happening until it is too late.  Protect your feet from hot and cold by wearing shoes, such as at the beach or on hot pavement.  Schedule a complete foot exam at least once a year (annually) or more often if you have foot problems. Report any cuts, sores, or bruises to your health care provider immediately. Where to find more information  American Diabetes Association: www.diabetes.org  Association of Diabetes Care & Education Specialists: www.diabeteseducator.org Contact a health care provider if:  You have a medical condition that increases your risk of infection and   you have any cuts, sores, or bruises on your feet.  You have an injury that is not healing.  You have redness on your legs or feet.  You feel burning or tingling in your legs or feet.  You have pain or cramps in your legs and feet.  Your legs or feet are numb.  Your feet always feel cold.  You have pain around any toenails. Get help right away if:  You have a wound, scrape, corn, or callus on your foot and: ? You have pain, swelling, or redness that gets worse. ? You have fluid or blood coming from the wound, scrape, corn, or callus. ? Your wound, scrape, corn, or callus feels warm to the touch. ? You have pus or a bad smell coming from  the wound, scrape, corn, or callus. ? You have a fever. ? You have a red line going up your leg. Summary  Check your feet every day for blisters, cuts, bruises, sores, and redness.  Apply a moisturizing lotion or petroleum jelly to the skin on your feet and to dry, brittle toenails.  Wear shoes that fit properly and have enough cushioning.  If you have foot problems, report any cuts, sores, or bruises to your health care provider immediately.  Schedule a complete foot exam at least once a year (annually) or more often if you have foot problems. This information is not intended to replace advice given to you by your health care provider. Make sure you discuss any questions you have with your health care provider. Document Revised: 07/17/2019 Document Reviewed: 07/17/2019 Elsevier Patient Education  2021 [employer]  

## 2020-04-20 LAB — HEMOGLOBIN A1C
Est. average glucose Bld gHb Est-mCnc: 143 mg/dL
Hgb A1c MFr Bld: 6.6 % — ABNORMAL HIGH (ref 4.8–5.6)

## 2020-05-04 ENCOUNTER — Emergency Department (HOSPITAL_BASED_OUTPATIENT_CLINIC_OR_DEPARTMENT_OTHER)
Admission: EM | Admit: 2020-05-04 | Discharge: 2020-05-04 | Disposition: A | Discharge status: Home / Self Care | Payer: Medicare Other | Attending: Emergency Medicine | Admitting: Emergency Medicine

## 2020-05-04 ENCOUNTER — Other Ambulatory Visit: Payer: Self-pay

## 2020-05-04 ENCOUNTER — Encounter (HOSPITAL_BASED_OUTPATIENT_CLINIC_OR_DEPARTMENT_OTHER): Payer: Self-pay | Admitting: Emergency Medicine

## 2020-05-04 ENCOUNTER — Emergency Department (HOSPITAL_BASED_OUTPATIENT_CLINIC_OR_DEPARTMENT_OTHER): Payer: Medicare Other | Admitting: Radiology

## 2020-05-04 DIAGNOSIS — Z87891 Personal history of nicotine dependence: Secondary | ICD-10-CM | POA: Insufficient documentation

## 2020-05-04 DIAGNOSIS — M25511 Pain in right shoulder: Secondary | ICD-10-CM | POA: Diagnosis not present

## 2020-05-04 DIAGNOSIS — S4991XA Unspecified injury of right shoulder and upper arm, initial encounter: Secondary | ICD-10-CM | POA: Diagnosis present

## 2020-05-04 DIAGNOSIS — S42271A Torus fracture of upper end of right humerus, initial encounter for closed fracture: Secondary | ICD-10-CM | POA: Diagnosis not present

## 2020-05-04 DIAGNOSIS — W010XXA Fall on same level from slipping, tripping and stumbling without subsequent striking against object, initial encounter: Secondary | ICD-10-CM | POA: Insufficient documentation

## 2020-05-04 DIAGNOSIS — E1140 Type 2 diabetes mellitus with diabetic neuropathy, unspecified: Secondary | ICD-10-CM | POA: Insufficient documentation

## 2020-05-04 DIAGNOSIS — I129 Hypertensive chronic kidney disease with stage 1 through stage 4 chronic kidney disease, or unspecified chronic kidney disease: Secondary | ICD-10-CM | POA: Diagnosis not present

## 2020-05-04 DIAGNOSIS — N184 Chronic kidney disease, stage 4 (severe): Secondary | ICD-10-CM | POA: Insufficient documentation

## 2020-05-04 DIAGNOSIS — Z794 Long term (current) use of insulin: Secondary | ICD-10-CM | POA: Insufficient documentation

## 2020-05-04 DIAGNOSIS — E1122 Type 2 diabetes mellitus with diabetic chronic kidney disease: Secondary | ICD-10-CM | POA: Diagnosis not present

## 2020-05-04 DIAGNOSIS — Z7982 Long term (current) use of aspirin: Secondary | ICD-10-CM | POA: Insufficient documentation

## 2020-05-04 MED ORDER — DICLOFENAC SODIUM 1 % EX GEL: 4.0000 g | Freq: Four times a day (QID) | CUTANEOUS | 0 refills | Status: DC

## 2020-05-04 MED ORDER — KETOROLAC TROMETHAMINE 60 MG/2ML IM SOLN
15.0000 mg | Freq: Once | INTRAMUSCULAR | Status: AC
  Administered 2020-05-04: 15 mg via INTRAMUSCULAR
  Filled 2020-05-04: qty 2

## 2020-05-04 MED ORDER — OXYCODONE HCL 5 MG PO TABS
2.5000 mg | ORAL_TABLET | Freq: Once | ORAL | Status: AC
Start: 2020-05-04 — End: 2020-05-04
  Administered 2020-05-04: 2.5 mg via ORAL
  Filled 2020-05-04: qty 1

## 2020-05-04 MED ORDER — MORPHINE SULFATE 15 MG PO TABS: 7.5000 mg | ORAL_TABLET | ORAL | 0 refills | Status: DC | PRN

## 2020-05-04 NOTE — ED Provider Notes (Signed)
[facility] EMERGENCY DEPT Provider Note   CSN: [id number] Arrival date & time: 05/04/20  1427     History Chief Complaint  Patient presents with  . Shoulder Injury    Justin Holloway is a 87 y.o. male.  87 yo M with a chief complaints of pain to the right shoulder.  The patient was trying to lift himself up in his bed by putting weight on his elbow and felt sharp and severe pain to the shoulder.  Has had difficulty moving the arm since.  Pain to the shoulder.  Denies any pain to the elbow and denies actual fall denies head injury denies neck pain denies chest pain.  Denies numbness or weakness.  The history is provided by the patient.  Shoulder Injury This is a new problem. The current episode started 1 to 2 hours ago. The problem occurs constantly. The problem has not changed since onset.Pertinent negatives include no chest pain, no abdominal pain, no headaches and no shortness of breath. The symptoms are aggravated by bending and twisting. Nothing relieves the symptoms. He has tried acetaminophen for the symptoms. The treatment provided no relief.       Past Medical History:  Diagnosis Date  . Arthritis   . CKD (chronic kidney disease), stage III ([place])   . Diabetes mellitus without complication ([place])   . Glaucoma    "blurred vision", see Dr Gershon Crane yearly  . Hyperlipidemia   . Hypertension   . TIA (transient ischemic attack)     Patient Active Problem List   Diagnosis Date Noted  . Secondary renal hyperparathyroidism ([place]) 01/14/2020  . Type 2 diabetes mellitus with stage 4 chronic kidney disease, with long-term current use of insulin ([place]) 09/10/2019  . Peripheral vascular disease, unspecified ([place]) 05/28/2019  . Diabetic neuropathy ([place]) 02/18/2019  . Left upper quadrant pain 09/04/2018  . Acute midline low back pain without sciatica 09/04/2018  . CKD (chronic kidney disease) stage 4, GFR 15-29 ml/min ([facility]) 11/21/2017  . Hypertensive nephropathy  11/21/2017  . Right foot pain 11/21/2017  . Vertigo 09/09/2017  . BPPV (benign paroxysmal positional vertigo), right 09/06/2017  . Sensorineural hearing loss (SNHL), bilateral 09/06/2017  . Ataxia 02/06/2017  . UTI (lower urinary tract infection) 08/07/2015  . Dizziness 08/07/2015  . Acute renal failure superimposed on stage 3 chronic kidney disease ([place]) 08/07/2015  . Hyperkalemia 08/07/2015  . Left inguinal hernia 05/15/2013  . Diabetes mellitus without complication ([place]) 29/52/8413  . HLD (hyperlipidemia) 03/08/2006  . HYPERTENSION, BENIGN SYSTEMIC 03/08/2006  . 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 PROTEINURIA 03/08/2006    Past Surgical History:  Procedure Laterality Date  . INGUINAL HERNIA REPAIR Left 06/09/2013   Procedure: OPEN REPAIR LEFT INGUINAL HERNIA;  Surgeon: [name], MD;  Location: [facility];  Service: General;  Laterality: Left;  . INSERTION OF MESH Left 06/09/2013   Procedure: INSERTION OF MESH;  Surgeon: [name], MD;  Location: [facility];  Service: General;  Laterality: Left;       Family History  Problem Relation Age of Onset  . Cancer Mother 51       breast  . COPD Father 65       Congestive heart failure    Social History   Tobacco Use  . Smoking status: Former Smoker    Years: 3.00    Types: Cigars    Quit date: 1991    Years since quitting: 31.3  . Smokeless tobacco: Never Used  . Tobacco comment:  socially   Vaping Use  . Vaping Use: Never used  Substance Use Topics  . Alcohol use: Never  . Drug use: No    Home Medications Prior to Admission medications   Medication Sig Start Date End Date Taking? Authorizing Provider  Ascorbic Acid (VITAMIN C) 1000 MG tablet Take 1,000 mg by mouth daily.    Yes [provider]  aspirin EC 81 MG tablet Take 81 mg by mouth every evening.   Yes [provider]  cholecalciferol (VITAMIN D) 1000 units tablet Take 2,000 Units by mouth daily.    Yes [provider]  ciprofloxacin  (CIPRO) 500 MG tablet Take 500 mg by mouth 2 (two) times daily. 08/21/19  Yes [provider]  diclofenac Sodium (VOLTAREN) 1 % GEL Apply 4 g topically 4 (four) times daily. 05/04/20  Yes Deno Etienne, DO  ferrous sulfate 325 (65 FE) MG tablet TAKE 1 TABLET BY MOUTH DAILY 04/30/19  Yes [provider]  furosemide (LASIX) 80 MG tablet TAKE 1 TABLET BY MOUTH IN  THE MORNING AND ONE-HALF  TABLET BY MOUTH IN THE  EARLY EVENING Patient taking differently: 80 mg 2 (two) times daily. TAKE 1 TABLET BY MOUTH IN  THE MORNING AND ONE-HALF  TABLET BY MOUTH IN THE  EARLY EVENING 06/30/19  Yes [name], MD  hydrALAZINE (APRESOLINE) 10 MG tablet Take 10 mg by mouth 3 (three) times daily. 01/21/20  Yes [provider]  insulin degludec (TRESIBA) 200 UNIT/ML FlexTouch Pen Inject 23 Units into the skin at bedtime.    Yes [provider]  morphine (MSIR) 15 MG tablet Take 0.5 tablets (7.5 mg total) by mouth every 4 (four) hours as needed for severe pain. 05/04/20  Yes Deno Etienne, DO  Multiple Vitamin (MULTIVITAMIN WITH MINERALS) TABS tablet Take 1 tablet by mouth daily.   Yes [provider]  polyethylene glycol powder (GLYCOLAX/MIRALAX) 17 GM/SCOOP powder Take 17 g by mouth daily.    Yes [provider]  pravastatin (PRAVACHOL) 40 MG tablet Take 1 tablet (40 mg total) by mouth daily. 02/23/20  Yes [name], MD  pregabalin (LYRICA) 25 MG capsule TAKE 1 CAPSULE(25 MG) BY MOUTH AT BEDTIME AS NEEDED 03/15/20  Yes [name], MD  Semaglutide,0.25 or 0.5MG/DOS, (OZEMPIC, 0.25 OR 0.5 MG/DOSE,) 2 MG/1.5ML SOPN Inject 0.5 mg into the skin once a week. 04/28/19  Yes Minette Brine, FNP  sildenafil (REVATIO) 20 MG tablet Take 1 tablet  by mouth as needed. MAX 3 TO 5 TABLETS PER DAY 01/06/20  Yes [name], MD  tamsulosin (FLOMAX) 0.4 MG CAPS capsule Take 0.4 mg by mouth daily. 07/04/19  Yes [provider]  Blood Glucose Monitoring Suppl (ONE TOUCH ULTRA 2)  w/Device KIT Use as directed to check blood sugars 2 times per day dx: e11.22 04/28/19   Minette Brine, FNP  COMBIGAN 0.2-0.5 % ophthalmic solution  09/01/19   [provider]  glucose blood (ONETOUCH ULTRA) test strip USE STRIPS TO TEST BLOOD  SUGAR 3 TIMES DAILY 1 STRIP PER TEST 06/26/19   Minette Brine, FNP  Lancets [facility] DELICA PLUS [id number]) [place] 100 each by Does not apply route in the morning, at noon, and at bedtime. 04/28/19   Minette Brine, FNP  LUMIGAN 0.01 % SOLN Place 1 drop into both eyes 2 (two) times daily.  01/22/18   [provider]    Allergies    Gabapentin  Review of Systems   Review of Systems  Constitutional: Negative  for chills and fever.  HENT: Negative for congestion and facial swelling.   Eyes: Negative for discharge and visual disturbance.  Respiratory: Negative for shortness of breath.   Cardiovascular: Negative for chest pain and palpitations.  Gastrointestinal: Negative for abdominal pain, diarrhea and vomiting.  Musculoskeletal: Positive for arthralgias and myalgias.  Skin: Negative for color change and rash.  Neurological: Negative for tremors, syncope and headaches.  Psychiatric/Behavioral: Negative for confusion and dysphoric mood.    Physical Exam Updated Vital Signs BP 138/69 (BP Location: Left Arm)   Pulse 96   Temp 98.9 F (37.2 C) (Oral)   Resp (!) 22   Ht _0  (1.651 m)   Wt 95.3 kg   SpO2 100%   BMI 34.95 kg/m   Physical Exam Vitals and nursing note reviewed.  Constitutional:      Appearance: He is well-developed.  HENT:     Head: Normocephalic and atraumatic.  Eyes:     Pupils: Pupils are equal, round, and reactive to light.  Neck:     Vascular: No JVD.  Cardiovascular:     Rate and Rhythm: Normal rate and regular rhythm.     Heart sounds: No murmur heard. No friction rub. No gallop.   Pulmonary:     Effort: No respiratory distress.     Breath sounds: No wheezing.  Abdominal:     General: There is no  distension.     Tenderness: There is no abdominal tenderness. There is no guarding or rebound.  Musculoskeletal:        General: Tenderness present. Normal range of motion.     Cervical back: Normal range of motion and neck supple.     Comments: Pain and swelling to the proximal humerus.  No pain along the clavicle or the elbow.  Full range of motion at the elbow and the wrist.  Pulse and sensation intact distally.  The patient has trouble extending his thumb.  Otherwise neurovascular intact.  Skin:    Coloration: Skin is not pale.     Findings: No rash.  Neurological:     Mental Status: He is alert and oriented to person, place, and time.  Psychiatric:        Behavior: Behavior normal.     ED Results / Procedures / Treatments   Labs (all labs ordered are listed, but only abnormal results are displayed) Labs Reviewed - No data to display  EKG None  Radiology DG Shoulder Right  Result Date: 05/04/2020 CLINICAL DATA:  Right shoulder pain and limited range of motion since the patient fell out of bed this morning. EXAM: RIGHT SHOULDER - 2+ VIEW COMPARISON:  None. FINDINGS: The patient has a mildly impacted surgical neck fracture of the humerus. The humeral head and neck have a mottled appearance. The acromioclavicular joint is intact with mild degenerative change present. Soft tissues are unremarkable. IMPRESSION: Mildly impacted surgical neck fracture of the humerus. Mild appearance of the humeral head could be due to osteopenia but underlying tumor is possible. Electronically Signed   By: Inge Rise M.D.   On: 05/04/2020 15:39    Procedures Procedures   Medications Ordered in ED Medications  ketorolac (TORADOL) injection 15 mg (15 mg Intramuscular Given 05/04/20 1535)  oxyCODONE (Oxy IR/ROXICODONE) immediate release tablet 2.5 mg (2.5 mg Oral Given 05/04/20 1533)    ED Course  I have reviewed the triage vital signs and the nursing notes.  Pertinent labs & imaging results  that were available during my care  of the patient were reviewed by me and considered in my medical decision making (see chart for details).    MDM Rules/Calculators/A&P                          87 yo M with a chief complaints of right shoulder pain.  Patient with pain to the proximal humerus.  Plain film viewed by me with a proximal humerus fracture with displacement.  No dislocation.  Will place in a sling.  Orthopedic follow-up.  Radiology read with question of underlying malignancy.  I did discuss this with the patient to denies any current history of cancer.  Suggest that he also follow-up with his family doctor.  3:48 PM:  I have discussed the diagnosis/risks/treatment options with the patient and family and believe the pt to be eligible for discharge home to follow-up with PCP, ortho. We also discussed returning to the ED immediately if new or worsening sx occur. We discussed the sx which are most concerning (e.g., sudden worsening pain, fever, inability to tolerate by mouth) that necessitate immediate return. Medications administered to the patient during their visit and any new prescriptions provided to the patient are listed below.  Medications given during this visit Medications  ketorolac (TORADOL) injection 15 mg (15 mg Intramuscular Given 05/04/20 1535)  oxyCODONE (Oxy IR/ROXICODONE) immediate release tablet 2.5 mg (2.5 mg Oral Given 05/04/20 1533)     The patient appears reasonably screen and/or stabilized for discharge and I doubt any other medical condition or other [facility] requiring further screening, evaluation, or treatment in the ED at this time prior to discharge.    Final Clinical Impression(s) / ED Diagnoses Final diagnoses:  Closed torus fracture of proximal end of right humerus, initial encounter    Rx / DC Orders ED Discharge Orders         Ordered    morphine (MSIR) 15 MG tablet  Every 4 hours PRN        05/04/20 1529    diclofenac Sodium (VOLTAREN) 1 % GEL  4  times daily        05/04/20 [place], [place], DO 05/04/20 1548

## 2020-05-04 NOTE — Discharge Instructions (Addendum)
You have a broken humerus bone.  This unfortunately will likely need to heal on its own.  It takes about 6 to 8 weeks for bones to heal.  Even though this hurts you a lot you will need to do range of motion exercises with your shoulder at least 4 times a day otherwise she can get a condition called frozen shoulder.  If you cannot move your shoulder around then please drop it to gravity and do circles.  Follow-up with an orthopedic doctor in the office I provided the orthopedic doctor that is on-call today.  Use the gel as prescribed. Also take tylenol 1000mg (2 extra strength) four times a day.   Then take the pain medicine if you feel like you need it. Narcotics do not help with the pain, they only make you care about it less.  You can become addicted to this, people may break into your house to steal it.  It will constipate you.  If you drive under the influence of this medicine you can get a DUI.

## 2020-05-04 NOTE — ED Triage Notes (Signed)
Pt stated that he slipped getting out of bed this morning and braced his fall with his right elbow on the bed. Pt stated that his shoulder has progressively gotten more painful and he can not raise his arm.  No LOC, No dizziness

## 2020-05-04 NOTE — ED Notes (Addendum)
Dr. Tyrone Nine notified of right  hand. Although appears to be slightly swollen there Good pulse and cap refill, and full movement and pulse. Pt states that hand is fine a and has full movement that is pain free.

## 2020-05-05 ENCOUNTER — Ambulatory Visit: Payer: Medicare Other | Admitting: Podiatry

## 2020-05-05 DIAGNOSIS — S42201A Unspecified fracture of upper end of right humerus, initial encounter for closed fracture: Secondary | ICD-10-CM | POA: Diagnosis not present

## 2020-05-06 ENCOUNTER — Telehealth: Payer: Self-pay

## 2020-05-06 NOTE — Chronic Care Management (AMB) (Signed)
Chronic Care Management Pharmacy Assistant   Name: Justin Holloway  MRN: [id number] DOB: Jan 26, 1929   Reason for Encounter: Disease State/ Hypertension    Recent office visits:  04-19-2020 Justin Chard, MD. Dose change for Hydralazine HCL 22m one tablet 3 times daily to 1754m3 times daily.   Recent consult visits:  none  Hospital visits:  Medication Reconciliation was completed by comparing discharge summary, patient's EMR and Pharmacy list, and upon discussion with patient.  Admitted to the hospital on 05-04-2020 due to closed torus fracture of proximal end of right humerus . Discharge date was 05-04-2020. [name] -started Diclofenac Sodium 4gram Gel 4 times daily due to closed torus fracture of proximal end of right humerus -started Morphine Sulfate 7.54m16mvery 4 hours as needed due to closed torus fracture of proximal end of right humerus   Medication Changes at Hospital Discharge: -None  Medications Discontinued at Hospital Discharge: -None  Medications that remain the same after Hospital Discharge:??  -All other medications will remain the same.    Medications: Outpatient Encounter Medications as of 05/06/2020  Medication Sig  . Ascorbic Acid (VITAMIN C) 1000 MG tablet Take 1,000 mg by mouth daily.   . aMarland Kitchenpirin EC 81 MG tablet Take 81 mg by mouth every evening.  . Blood Glucose Monitoring Suppl (ONE TOUCH ULTRA 2) w/Device KIT Use as directed to check blood sugars 2 times per day dx: e11.22  . cholecalciferol (VITAMIN D) 1000 units tablet Take 2,000 Units by mouth daily.   . ciprofloxacin (CIPRO) 500 MG tablet Take 500 mg by mouth 2 (two) times daily.  . COMBIGAN 0.2-0.5 % ophthalmic solution   . diclofenac Sodium (VOLTAREN) 1 % GEL Apply 4 g topically 4 (four) times daily.  . ferrous sulfate 325 (65 FE) MG tablet TAKE 1 TABLET BY MOUTH DAILY  . furosemide  (LASIX) 80 MG tablet TAKE 1 TABLET BY MOUTH IN  THE MORNING AND ONE-HALF  TABLET BY MOUTH IN THE  EARLY EVENING (Patient taking differently: 80 mg 2 (two) times daily. TAKE 1 TABLET BY MOUTH IN  THE MORNING AND ONE-HALF  TABLET BY MOUTH IN THE  EARLY EVENING)  . glucose blood (ONETOUCH ULTRA) test strip USE STRIPS TO TEST BLOOD  SUGAR 3 TIMES DAILY 1 STRIP PER TEST  . hydrALAZINE (APRESOLINE) 10 MG tablet Take 10 mg by mouth 3 (three) times daily.  . insulin degludec (TRESIBA) 200 UNIT/ML FlexTouch Pen Inject 23 Units into the skin at bedtime.   . Lancets (ONETOUCH DELICA PLUS [id number] 100 each by Does not apply route in the morning, at noon, and at bedtime.  . LMarland KitchenMIGAN 0.01 % SOLN Place 1 drop into both eyes 2 (two) times daily.   . mMarland Kitchenrphine (MSIR) 15 MG tablet Take 0.5 tablets (7.5 mg total) by mouth every 4 (four) hours as needed for severe pain.  . Multiple Vitamin (MULTIVITAMIN WITH MINERALS) TABS tablet Take 1 tablet by mouth daily.  . polyethylene glycol powder (GLYCOLAX/MIRALAX) 17 GM/SCOOP powder Take 17 g by mouth daily.   . pravastatin (PRAVACHOL) 40 MG tablet Take 1 tablet (40 mg total) by mouth daily.  . pregabalin (LYRICA) 25 MG capsule TAKE 1 CAPSULE(25 MG) BY MOUTH AT BEDTIME AS NEEDED  . Semaglutide,0.25 or 0.5MG/DOS, (OZEMPIC, 0.25 OR 0.5 MG/DOSE,) 2 MG/1.5ML SOPN Inject 0.5 mg into the skin once a week.  . sildenafil (REVATIO) 20 MG tablet Take  1 tablet  by mouth as needed. MAX 3 TO 5 TABLETS PER DAY  . tamsulosin (FLOMAX) 0.4 MG CAPS capsule Take 0.4 mg by mouth daily.   No facility-administered encounter medications on file as of 05/06/2020.   Reviewed chart prior to disease state call. Spoke with patient regarding BP  Recent Office Vitals: BP Readings from Last 3 Encounters:  05/04/20 131/71  04/19/20 124/78  01/14/20 120/64   Pulse Readings from Last 3 Encounters:  05/04/20 89  04/19/20 (!) 50  01/14/20 98    Wt Readings from Last 3 Encounters:  05/04/20 210  lb (95.3 kg)  04/19/20 211 lb 12.8 oz (96.1 kg)  01/14/20 213 lb (96.6 kg)     Kidney Function Lab Results  Component Value Date/Time   CREATININE 3.25 (H) 01/14/2020 04:59 PM   CREATININE 2.4 (A) 11/17/2019 12:00 AM   CREATININE 2.85 (H) 09/10/2019 10:29 AM   GFRNONAA 16 (L) 01/14/2020 04:59 PM   GFRAA 19 (L) 01/14/2020 04:59 PM    BMP Latest Ref Rng & Units 01/14/2020 11/17/2019 09/10/2019  Glucose 65 - 99 mg/dL 114(H) - 86  BUN 8 - 27 mg/dL 51(H) 48(A) 54(H)  Creatinine 0.76 - 1.27 mg/dL 3.25(H) 2.4(A) 2.85(H)  BUN/Creat Ratio 10 - 24 16 - 19  Sodium 134 - 144 mmol/L 140 136(A) 137  Potassium 3.5 - 5.2 mmol/L 4.5 4.2 4.8  Chloride 96 - 106 mmol/L 100 103 100  CO2 20 - 29 mmol/L 21 24(A) 21  Calcium 8.6 - 10.2 mg/dL 9.5 9.2 9.3    . Current antihypertensive regimen:  ? Hydralazine 35m three times daily  . How often are you checking your Blood Pressure? Patient states he doesn't have a BP cuff   Current home BP readings: None  . What recent interventions/DTPs have been made by any provider to improve Blood Pressure control since last CPP Visit: Patient states he is taking and managing medications well.   . Any recent hospitalizations or ED visits since last visit with CPP? Yes   . What diet changes have been made to improve Blood Pressure Control?  o Patient states he doesn't use any salt in foods.  . What exercise is being done to improve your Blood Pressure Control?  o Patients recently fractured his shoulder but he does continue to stretch and walk throughout the day.  Adherence Review: Is the patient currently on ACE/ARB medication? No Does the patient have >5 day gap between last estimated fill dates? No  Sent scheduling a message to schedule phone appointment with VOrlando Penneron 06-09-20 @ 8:30. Patient is aware.   Star Rating Drugs: Pravastatin 49m02-14-2022 90DS [facility]  Ozempic 23m35m.5ml 04-28-2019 [phone]

## 2020-05-10 ENCOUNTER — Inpatient Hospital Stay (HOSPITAL_COMMUNITY)
Admission: EM | Admit: 2020-05-10 | Discharge: 2020-05-18 | DRG: 684 | Disposition: A | Discharge status: Skilled Nursing Facility | Payer: Medicare Other | Attending: Internal Medicine | Admitting: Internal Medicine

## 2020-05-10 ENCOUNTER — Emergency Department (HOSPITAL_COMMUNITY): Payer: Medicare Other

## 2020-05-10 ENCOUNTER — Encounter (HOSPITAL_COMMUNITY): Payer: Self-pay | Admitting: Internal Medicine

## 2020-05-10 ENCOUNTER — Other Ambulatory Visit: Payer: Self-pay

## 2020-05-10 DIAGNOSIS — R5381 Other malaise: Secondary | ICD-10-CM | POA: Diagnosis not present

## 2020-05-10 DIAGNOSIS — Z20822 Contact with and (suspected) exposure to covid-19: Secondary | ICD-10-CM | POA: Diagnosis present

## 2020-05-10 DIAGNOSIS — W06XXXA Fall from bed, initial encounter: Secondary | ICD-10-CM | POA: Diagnosis present

## 2020-05-10 DIAGNOSIS — N179 Acute kidney failure, unspecified: Principal | ICD-10-CM | POA: Diagnosis present

## 2020-05-10 DIAGNOSIS — Z8673 Personal history of transient ischemic attack (TIA), and cerebral infarction without residual deficits: Secondary | ICD-10-CM

## 2020-05-10 DIAGNOSIS — N185 Chronic kidney disease, stage 5: Secondary | ICD-10-CM | POA: Diagnosis not present

## 2020-05-10 DIAGNOSIS — Z7401 Bed confinement status: Secondary | ICD-10-CM | POA: Diagnosis not present

## 2020-05-10 DIAGNOSIS — I10 Essential (primary) hypertension: Secondary | ICD-10-CM | POA: Diagnosis present

## 2020-05-10 DIAGNOSIS — Z7982 Long term (current) use of aspirin: Secondary | ICD-10-CM | POA: Diagnosis not present

## 2020-05-10 DIAGNOSIS — Z825 Family history of asthma and other chronic lower respiratory diseases: Secondary | ICD-10-CM | POA: Diagnosis not present

## 2020-05-10 DIAGNOSIS — Z6833 Body mass index (BMI) 33.0-33.9, adult: Secondary | ICD-10-CM

## 2020-05-10 DIAGNOSIS — E1151 Type 2 diabetes mellitus with diabetic peripheral angiopathy without gangrene: Secondary | ICD-10-CM | POA: Diagnosis present

## 2020-05-10 DIAGNOSIS — E1140 Type 2 diabetes mellitus with diabetic neuropathy, unspecified: Secondary | ICD-10-CM | POA: Diagnosis not present

## 2020-05-10 DIAGNOSIS — E785 Hyperlipidemia, unspecified: Secondary | ICD-10-CM | POA: Diagnosis not present

## 2020-05-10 DIAGNOSIS — K5904 Chronic idiopathic constipation: Secondary | ICD-10-CM | POA: Diagnosis not present

## 2020-05-10 DIAGNOSIS — E1122 Type 2 diabetes mellitus with diabetic chronic kidney disease: Secondary | ICD-10-CM | POA: Diagnosis not present

## 2020-05-10 DIAGNOSIS — Z7189 Other specified counseling: Secondary | ICD-10-CM

## 2020-05-10 DIAGNOSIS — R627 Adult failure to thrive: Secondary | ICD-10-CM | POA: Diagnosis present

## 2020-05-10 DIAGNOSIS — Z888 Allergy status to other drugs, medicaments and biological substances status: Secondary | ICD-10-CM | POA: Diagnosis not present

## 2020-05-10 DIAGNOSIS — Z515 Encounter for palliative care: Secondary | ICD-10-CM

## 2020-05-10 DIAGNOSIS — E1129 Type 2 diabetes mellitus with other diabetic kidney complication: Secondary | ICD-10-CM | POA: Diagnosis not present

## 2020-05-10 DIAGNOSIS — S42201A Unspecified fracture of upper end of right humerus, initial encounter for closed fracture: Secondary | ICD-10-CM | POA: Diagnosis not present

## 2020-05-10 DIAGNOSIS — I129 Hypertensive chronic kidney disease with stage 1 through stage 4 chronic kidney disease, or unspecified chronic kidney disease: Secondary | ICD-10-CM | POA: Diagnosis not present

## 2020-05-10 DIAGNOSIS — Z87891 Personal history of nicotine dependence: Secondary | ICD-10-CM

## 2020-05-10 DIAGNOSIS — G8929 Other chronic pain: Secondary | ICD-10-CM | POA: Diagnosis not present

## 2020-05-10 DIAGNOSIS — S42201D Unspecified fracture of upper end of right humerus, subsequent encounter for fracture with routine healing: Secondary | ICD-10-CM

## 2020-05-10 DIAGNOSIS — E119 Type 2 diabetes mellitus without complications: Secondary | ICD-10-CM

## 2020-05-10 DIAGNOSIS — Z794 Long term (current) use of insulin: Secondary | ICD-10-CM

## 2020-05-10 DIAGNOSIS — R296 Repeated falls: Secondary | ICD-10-CM | POA: Diagnosis present

## 2020-05-10 DIAGNOSIS — M25569 Pain in unspecified knee: Secondary | ICD-10-CM | POA: Diagnosis not present

## 2020-05-10 DIAGNOSIS — M2550 Pain in unspecified joint: Secondary | ICD-10-CM | POA: Diagnosis not present

## 2020-05-10 DIAGNOSIS — E661 Drug-induced obesity: Secondary | ICD-10-CM | POA: Diagnosis present

## 2020-05-10 DIAGNOSIS — R531 Weakness: Secondary | ICD-10-CM | POA: Diagnosis not present

## 2020-05-10 DIAGNOSIS — M25562 Pain in left knee: Secondary | ICD-10-CM | POA: Diagnosis present

## 2020-05-10 DIAGNOSIS — H409 Unspecified glaucoma: Secondary | ICD-10-CM | POA: Diagnosis present

## 2020-05-10 DIAGNOSIS — N189 Chronic kidney disease, unspecified: Secondary | ICD-10-CM | POA: Diagnosis not present

## 2020-05-10 DIAGNOSIS — M25462 Effusion, left knee: Secondary | ICD-10-CM | POA: Diagnosis not present

## 2020-05-10 DIAGNOSIS — Z803 Family history of malignant neoplasm of breast: Secondary | ICD-10-CM

## 2020-05-10 DIAGNOSIS — E66811 Obesity, class 1: Secondary | ICD-10-CM

## 2020-05-10 DIAGNOSIS — Z6834 Body mass index (BMI) 34.0-34.9, adult: Secondary | ICD-10-CM

## 2020-05-10 DIAGNOSIS — K5900 Constipation, unspecified: Secondary | ICD-10-CM | POA: Diagnosis present

## 2020-05-10 DIAGNOSIS — H538 Other visual disturbances: Secondary | ICD-10-CM | POA: Diagnosis present

## 2020-05-10 DIAGNOSIS — I120 Hypertensive chronic kidney disease with stage 5 chronic kidney disease or end stage renal disease: Secondary | ICD-10-CM | POA: Diagnosis not present

## 2020-05-10 DIAGNOSIS — N183 Chronic kidney disease, stage 3 unspecified: Secondary | ICD-10-CM | POA: Diagnosis not present

## 2020-05-10 DIAGNOSIS — Z79899 Other long term (current) drug therapy: Secondary | ICD-10-CM | POA: Diagnosis not present

## 2020-05-10 DIAGNOSIS — N184 Chronic kidney disease, stage 4 (severe): Secondary | ICD-10-CM | POA: Diagnosis present

## 2020-05-10 DIAGNOSIS — I639 Cerebral infarction, unspecified: Secondary | ICD-10-CM | POA: Diagnosis not present

## 2020-05-10 DIAGNOSIS — S42291A Other displaced fracture of upper end of right humerus, initial encounter for closed fracture: Secondary | ICD-10-CM | POA: Diagnosis not present

## 2020-05-10 DIAGNOSIS — Z66 Do not resuscitate: Secondary | ICD-10-CM | POA: Diagnosis not present

## 2020-05-10 DIAGNOSIS — M6281 Muscle weakness (generalized): Secondary | ICD-10-CM | POA: Diagnosis present

## 2020-05-10 DIAGNOSIS — Z743 Need for continuous supervision: Secondary | ICD-10-CM | POA: Diagnosis not present

## 2020-05-10 DIAGNOSIS — M1990 Unspecified osteoarthritis, unspecified site: Secondary | ICD-10-CM | POA: Diagnosis present

## 2020-05-10 DIAGNOSIS — E1165 Type 2 diabetes mellitus with hyperglycemia: Secondary | ICD-10-CM | POA: Diagnosis present

## 2020-05-10 DIAGNOSIS — E6609 Other obesity due to excess calories: Secondary | ICD-10-CM

## 2020-05-10 DIAGNOSIS — S42211A Unspecified displaced fracture of surgical neck of right humerus, initial encounter for closed fracture: Secondary | ICD-10-CM | POA: Diagnosis not present

## 2020-05-10 DIAGNOSIS — D649 Anemia, unspecified: Secondary | ICD-10-CM | POA: Diagnosis not present

## 2020-05-10 HISTORY — DX: Chronic kidney disease, stage 4 (severe): N18.4

## 2020-05-10 LAB — BASIC METABOLIC PANEL
Anion gap: 15 (ref 5–15)
BUN: 79 mg/dL — ABNORMAL HIGH (ref 8–23)
CO2: 21 mmol/L — ABNORMAL LOW (ref 22–32)
Calcium: 8.7 mg/dL — ABNORMAL LOW (ref 8.9–10.3)
Chloride: 98 mmol/L (ref 98–111)
Creatinine, Ser: 4.13 mg/dL — ABNORMAL HIGH (ref 0.61–1.24)
GFR, Estimated: 13 mL/min — ABNORMAL LOW (ref >60)
Glucose, Bld: 87 mg/dL (ref 70–99)
Potassium: 3.7 mmol/L (ref 3.5–5.1)
Sodium: 134 mmol/L — ABNORMAL LOW (ref 135–145)

## 2020-05-10 LAB — CBC WITH DIFFERENTIAL/PLATELET
Abs Immature Granulocytes: 0.07 10*3/uL (ref 0.00–0.07)
Basophils Absolute: 0 10*3/uL (ref 0.0–0.1)
Basophils Relative: 0 %
Eosinophils Absolute: 0.1 10*3/uL (ref 0.0–0.5)
Eosinophils Relative: 1 %
HCT: 29.3 % — ABNORMAL LOW (ref 39.0–52.0)
Hemoglobin: 9.5 g/dL — ABNORMAL LOW (ref 13.0–17.0)
Immature Granulocytes: 1 %
Lymphocytes Relative: 5 %
Lymphs Abs: 0.6 10*3/uL — ABNORMAL LOW (ref 0.7–4.0)
MCH: 29.6 pg (ref 26.0–34.0)
MCHC: 32.4 g/dL (ref 30.0–36.0)
MCV: 91.3 fL (ref 80.0–100.0)
Monocytes Absolute: 0.7 10*3/uL (ref 0.1–1.0)
Monocytes Relative: 6 %
Neutro Abs: 9.7 10*3/uL — ABNORMAL HIGH (ref 1.7–7.7)
Neutrophils Relative %: 87 %
Platelets: 226 10*3/uL (ref 150–400)
RBC: 3.21 MIL/uL — ABNORMAL LOW (ref 4.22–5.81)
RDW: 12.7 % (ref 11.5–15.5)
WBC: 11.2 10*3/uL — ABNORMAL HIGH (ref 4.0–10.5)
nRBC: 0 % (ref 0.0–0.2)

## 2020-05-10 LAB — GLUCOSE, CAPILLARY
Glucose-Capillary: 94 mg/dL (ref 70–99)
Glucose-Capillary: 60 mg/dL — ABNORMAL LOW (ref 70–99)
Glucose-Capillary: 65 mg/dL — ABNORMAL LOW (ref 70–99)
Glucose-Capillary: 126 mg/dL — ABNORMAL HIGH (ref 70–99)
Glucose-Capillary: 173 mg/dL — ABNORMAL HIGH (ref 70–99)

## 2020-05-10 LAB — RESP PANEL BY RT-PCR (FLU A&B, COVID) ARPGX2
Influenza A by PCR: NEGATIVE
Influenza B by PCR: NEGATIVE
SARS Coronavirus 2 by RT PCR: NEGATIVE

## 2020-05-10 MED ORDER — DOCUSATE SODIUM 100 MG PO CAPS
100.0000 mg | ORAL_CAPSULE | Freq: Two times a day (BID) | ORAL | Status: DC
  Administered 2020-05-10 – 2020-05-13 (×6): 100 mg via ORAL
  Filled 2020-05-10 (×8): qty 1

## 2020-05-10 MED ORDER — CAMPHOR-MENTHOL 0.5-0.5 % EX LOTN
1.0000 "application " | TOPICAL_LOTION | Freq: Three times a day (TID) | CUTANEOUS | Status: DC | PRN
  Filled 2020-05-10: qty 222

## 2020-05-10 MED ORDER — SODIUM CHLORIDE 0.9 % IV BOLUS
500.0000 mL | Freq: Once | INTRAVENOUS | Status: AC
  Administered 2020-05-10: 500 mL via INTRAVENOUS

## 2020-05-10 MED ORDER — HYDRALAZINE HCL 10 MG PO TABS
10.0000 mg | ORAL_TABLET | Freq: Three times a day (TID) | ORAL | Status: DC
  Administered 2020-05-10 – 2020-05-18 (×21): 10 mg via ORAL
  Filled 2020-05-10 (×24): qty 1

## 2020-05-10 MED ORDER — PRAVASTATIN SODIUM 40 MG PO TABS
40.0000 mg | ORAL_TABLET | Freq: Every day | ORAL | Status: DC
  Administered 2020-05-10 – 2020-05-17 (×8): 40 mg via ORAL
  Filled 2020-05-10 (×8): qty 1

## 2020-05-10 MED ORDER — ONDANSETRON HCL 4 MG PO TABS: 4.0000 mg | ORAL_TABLET | Freq: Four times a day (QID) | ORAL | Status: DC | PRN

## 2020-05-10 MED ORDER — CALCIUM CARBONATE ANTACID 1250 MG/5ML PO SUSP
500.0000 mg | Freq: Four times a day (QID) | ORAL | Status: DC | PRN
  Filled 2020-05-10 (×2): qty 5

## 2020-05-10 MED ORDER — LATANOPROST 0.005 % OP SOLN
1.0000 [drp] | Freq: Every day | OPHTHALMIC | Status: DC
  Administered 2020-05-10 – 2020-05-17 (×8): 1 [drp] via OPHTHALMIC
  Filled 2020-05-10: qty 2.5

## 2020-05-10 MED ORDER — INSULIN ASPART 100 UNIT/ML IJ SOLN
0.0000 [IU] | Freq: Three times a day (TID) | INTRAMUSCULAR | Status: DC
  Administered 2020-05-11: 1 [IU] via SUBCUTANEOUS
  Administered 2020-05-12: 2 [IU] via SUBCUTANEOUS
  Administered 2020-05-13: 1 [IU] via SUBCUTANEOUS
  Administered 2020-05-13: 2 [IU] via SUBCUTANEOUS
  Administered 2020-05-14 (×2): 1 [IU] via SUBCUTANEOUS
  Administered 2020-05-15 (×2): 2 [IU] via SUBCUTANEOUS
  Administered 2020-05-15: 1 [IU] via SUBCUTANEOUS
  Administered 2020-05-16 (×2): 2 [IU] via SUBCUTANEOUS
  Administered 2020-05-16: 5 [IU] via SUBCUTANEOUS
  Administered 2020-05-17 (×2): 2 [IU] via SUBCUTANEOUS
  Administered 2020-05-18: 1 [IU] via SUBCUTANEOUS
  Administered 2020-05-18: 2 [IU] via SUBCUTANEOUS

## 2020-05-10 MED ORDER — ONDANSETRON HCL 4 MG/2ML IJ SOLN: 4.0000 mg | Freq: Four times a day (QID) | INTRAMUSCULAR | Status: DC | PRN

## 2020-05-10 MED ORDER — LACTATED RINGERS IV SOLN: INTRAVENOUS | Status: DC

## 2020-05-10 MED ORDER — SORBITOL 70 % SOLN
30.0000 mL | Status: DC | PRN
  Administered 2020-05-10: 30 mL via ORAL
  Filled 2020-05-10: qty 30

## 2020-05-10 MED ORDER — ACETAMINOPHEN 650 MG RE SUPP: 650.0000 mg | Freq: Four times a day (QID) | RECTAL | Status: DC | PRN

## 2020-05-10 MED ORDER — TRAMADOL HCL 50 MG PO TABS
50.0000 mg | ORAL_TABLET | Freq: Two times a day (BID) | ORAL | Status: DC | PRN
  Administered 2020-05-10 – 2020-05-17 (×8): 50 mg via ORAL
  Filled 2020-05-10 (×9): qty 1

## 2020-05-10 MED ORDER — HYDROXYZINE HCL 25 MG PO TABS: 25.0000 mg | ORAL_TABLET | Freq: Three times a day (TID) | ORAL | Status: DC | PRN

## 2020-05-10 MED ORDER — DOCUSATE SODIUM 283 MG RE ENEM
1.0000 | ENEMA | RECTAL | Status: DC | PRN
  Filled 2020-05-10: qty 1

## 2020-05-10 MED ORDER — NEPRO/CARBSTEADY PO LIQD
237.0000 mL | Freq: Three times a day (TID) | ORAL | Status: DC | PRN
  Filled 2020-05-10: qty 237

## 2020-05-10 MED ORDER — BRIMONIDINE TARTRATE-TIMOLOL 0.2-0.5 % OP SOLN: 1.0000 [drp] | Freq: Two times a day (BID) | OPHTHALMIC | Status: DC

## 2020-05-10 MED ORDER — BRIMONIDINE TARTRATE 0.15 % OP SOLN
1.0000 [drp] | Freq: Two times a day (BID) | OPHTHALMIC | Status: DC
  Administered 2020-05-10 – 2020-05-18 (×16): 1 [drp] via OPHTHALMIC
  Filled 2020-05-10: qty 5

## 2020-05-10 MED ORDER — INSULIN DEGLUDEC 200 UNIT/ML ~~LOC~~ SOPN: 23.0000 [IU] | PEN_INJECTOR | Freq: Every day | SUBCUTANEOUS | Status: DC

## 2020-05-10 MED ORDER — ENOXAPARIN SODIUM 30 MG/0.3ML IJ SOSY
30.0000 mg | PREFILLED_SYRINGE | INTRAMUSCULAR | Status: DC
  Administered 2020-05-10 – 2020-05-18 (×9): 30 mg via SUBCUTANEOUS
  Filled 2020-05-10 (×10): qty 0.3

## 2020-05-10 MED ORDER — ZOLPIDEM TARTRATE 5 MG PO TABS
5.0000 mg | ORAL_TABLET | Freq: Every evening | ORAL | Status: DC | PRN
  Filled 2020-05-10: qty 1

## 2020-05-10 MED ORDER — TAMSULOSIN HCL 0.4 MG PO CAPS
0.4000 mg | ORAL_CAPSULE | Freq: Every day | ORAL | Status: DC
  Administered 2020-05-10 – 2020-05-18 (×9): 0.4 mg via ORAL
  Filled 2020-05-10 (×9): qty 1

## 2020-05-10 MED ORDER — HYDRALAZINE HCL 20 MG/ML IJ SOLN: 5.0000 mg | INTRAMUSCULAR | Status: DC | PRN

## 2020-05-10 MED ORDER — HYDROCODONE-ACETAMINOPHEN 5-325 MG PO TABS
2.0000 | ORAL_TABLET | Freq: Once | ORAL | Status: AC
  Administered 2020-05-10: 2 via ORAL
  Filled 2020-05-10: qty 2

## 2020-05-10 MED ORDER — POLYETHYLENE GLYCOL 3350 17 G PO PACK: 17.0000 g | PACK | Freq: Every day | ORAL | Status: DC | PRN

## 2020-05-10 MED ORDER — TRAMADOL HCL 50 MG PO TABS: 50.0000 mg | ORAL_TABLET | Freq: Four times a day (QID) | ORAL | Status: DC | PRN

## 2020-05-10 MED ORDER — BISACODYL 5 MG PO TBEC: 5.0000 mg | DELAYED_RELEASE_TABLET | Freq: Every day | ORAL | Status: DC | PRN

## 2020-05-10 MED ORDER — ASPIRIN EC 81 MG PO TBEC
81.0000 mg | DELAYED_RELEASE_TABLET | Freq: Every evening | ORAL | Status: DC
  Administered 2020-05-10 – 2020-05-17 (×8): 81 mg via ORAL
  Filled 2020-05-10 (×8): qty 1

## 2020-05-10 MED ORDER — DEXTROSE 50 % IV SOLN
INTRAVENOUS | Status: AC
  Administered 2020-05-10: 50 mL
  Filled 2020-05-10: qty 50

## 2020-05-10 MED ORDER — TIMOLOL MALEATE 0.5 % OP SOLN
1.0000 [drp] | Freq: Two times a day (BID) | OPHTHALMIC | Status: DC
  Administered 2020-05-10 – 2020-05-18 (×16): 1 [drp] via OPHTHALMIC
  Filled 2020-05-10: qty 5

## 2020-05-10 MED ORDER — PREGABALIN 25 MG PO CAPS
25.0000 mg | ORAL_CAPSULE | Freq: Every day | ORAL | Status: DC
  Administered 2020-05-10 – 2020-05-17 (×8): 25 mg via ORAL
  Filled 2020-05-10 (×8): qty 1

## 2020-05-10 MED ORDER — INSULIN GLARGINE 100 UNIT/ML ~~LOC~~ SOLN
23.0000 [IU] | Freq: Every day | SUBCUTANEOUS | Status: DC
  Administered 2020-05-10: 23 [IU] via SUBCUTANEOUS
  Filled 2020-05-10 (×2): qty 0.23

## 2020-05-10 MED ORDER — ACETAMINOPHEN 325 MG PO TABS
650.0000 mg | ORAL_TABLET | Freq: Four times a day (QID) | ORAL | Status: DC | PRN
  Administered 2020-05-11 – 2020-05-13 (×3): 650 mg via ORAL
  Filled 2020-05-10 (×3): qty 2

## 2020-05-10 NOTE — ED Notes (Signed)
Patient transported to x-ray. ?

## 2020-05-10 NOTE — ED Triage Notes (Signed)
Pt arrives from EMS, EMS report pt was walking to the couch felt his left leg give out and ease himself to the ground. Pt states he is in no pain however wanted to get evaluated due to prior fall about a week ago and broke right shoulder. Pt is on no thinners    120/62 HR 76 98 ra  cbg 110

## 2020-05-10 NOTE — ED Provider Notes (Signed)
[facility] EMERGENCY DEPARTMENT Provider Note   CSN: [id number] Arrival date & time: 05/10/20  0230     History Chief Complaint  Patient presents with  . Extremity Weakness    Justin Holloway is a 87 y.o. male.  Patient is an 87 year old male with chronic renal insufficiency, diabetes, hypertension, hyperlipidemia.  Patient brought here by EMS after a fall.  Patient states that his left knee "gave out" on him this evening when he was walking in his living room.  This caused him to lower himself to the ground, then had difficulty getting up off the floor.  EMS was called and patient was transported here.  Patient had a fall last week and injured his right shoulder.  He was seen by orthopedics, but no surgical intervention is required.  His wife was concerned that he has now fallen twice and wants him "checked out".  Patient denies to me he is having any discomfort.  He denies any injury from the fall.  He denies any numbness, weakness.  He tells me that his knee has gone out on him in the past.  The history is provided by the patient.       Past Medical History:  Diagnosis Date  . Arthritis   . CKD (chronic kidney disease), stage III ([place])   . Diabetes mellitus without complication ([place])   . Glaucoma    "blurred vision", see Dr Gershon Crane yearly  . Hyperlipidemia   . Hypertension   . TIA (transient ischemic attack)     Patient Active Problem List   Diagnosis Date Noted  . Secondary renal hyperparathyroidism ([place]) 01/14/2020  . Type 2 diabetes mellitus with stage 4 chronic kidney disease, with long-term current use of insulin ([place]) 09/10/2019  . Peripheral vascular disease, unspecified ([place]) 05/28/2019  . Diabetic neuropathy ([place]) 02/18/2019  . Left upper quadrant pain 09/04/2018  . Acute midline low back pain without sciatica 09/04/2018  . CKD (chronic kidney disease) stage 4, GFR 15-29 ml/min (HCC) 11/21/2017  . Hypertensive nephropathy 11/21/2017  .  Right foot pain 11/21/2017  . Vertigo 09/09/2017  . BPPV (benign paroxysmal positional vertigo), right 09/06/2017  . Sensorineural hearing loss (SNHL), bilateral 09/06/2017  . Ataxia 02/06/2017  . UTI (lower urinary tract infection) 08/07/2015  . Dizziness 08/07/2015  . Acute renal failure superimposed on stage 3 chronic kidney disease ([place]) 08/07/2015  . Hyperkalemia 08/07/2015  . Left inguinal hernia 05/15/2013  . Diabetes mellitus without complication ([place]) 54/62/7035  . HLD (hyperlipidemia) 03/08/2006  . HYPERTENSION, BENIGN SYSTEMIC 03/08/2006  . 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 PROTEINURIA 03/08/2006    Past Surgical History:  Procedure Laterality Date  . INGUINAL HERNIA REPAIR Left 06/09/2013   Procedure: OPEN REPAIR LEFT INGUINAL HERNIA;  Surgeon: [name], MD;  Location: [facility];  Service: General;  Laterality: Left;  . INSERTION OF MESH Left 06/09/2013   Procedure: INSERTION OF MESH;  Surgeon: [name], MD;  Location: [facility];  Service: General;  Laterality: Left;       Family History  Problem Relation Age of Onset  . Cancer Mother 53       breast  . COPD Father 35       Congestive heart failure    Social History   Tobacco Use  . Smoking status: Former Smoker    Years: 3.00    Types: Cigars    Quit date: 1991    Years since quitting: 31.3  . Smokeless tobacco: Never  Used  . Tobacco comment: socially   Vaping Use  . Vaping Use: Never used  Substance Use Topics  . Alcohol use: Never  . Drug use: No    Home Medications Prior to Admission medications   Medication Sig Start Date End Date Taking? Authorizing Provider  Ascorbic Acid (VITAMIN C) 1000 MG tablet Take 1,000 mg by mouth daily.     [provider]  aspirin EC 81 MG tablet Take 81 mg by mouth every evening.    [provider]  Blood Glucose Monitoring Suppl (ONE TOUCH ULTRA 2) w/Device KIT Use as directed to check blood sugars 2 times per day dx: e11.22 04/28/19   Minette Brine, FNP  cholecalciferol (VITAMIN D) 1000 units tablet Take 2,000 Units by mouth daily.     [provider]  ciprofloxacin (CIPRO) 500 MG tablet Take 500 mg by mouth 2 (two) times daily. 08/21/19   [provider]  COMBIGAN 0.2-0.5 % ophthalmic solution  09/01/19   [provider]  diclofenac Sodium (VOLTAREN) 1 % GEL Apply 4 g topically 4 (four) times daily. 05/04/20   Deno Etienne, DO  ferrous sulfate 325 (65 FE) MG tablet TAKE 1 TABLET BY MOUTH DAILY 04/30/19   [provider]  furosemide (LASIX) 80 MG tablet TAKE 1 TABLET BY MOUTH IN  THE MORNING AND ONE-HALF  TABLET BY MOUTH IN THE  EARLY EVENING Patient taking differently: 80 mg 2 (two) times daily. TAKE 1 TABLET BY MOUTH IN  THE MORNING AND ONE-HALF  TABLET BY MOUTH IN THE  EARLY EVENING 06/30/19   [name], MD  glucose blood (ONETOUCH ULTRA) test strip USE STRIPS TO TEST BLOOD  SUGAR 3 TIMES DAILY 1 STRIP PER TEST 06/26/19   Minette Brine, FNP  hydrALAZINE (APRESOLINE) 10 MG tablet Take 10 mg by mouth 3 (three) times daily. 01/21/20   [provider]  insulin degludec (TRESIBA) 200 UNIT/ML FlexTouch Pen Inject 23 Units into the skin at bedtime.     [provider]  Lancets [facility] DELICA PLUS [id number]) [place] 100 each by Does not apply route in the morning, at noon, and at bedtime. 04/28/19   Minette Brine, FNP  LUMIGAN 0.01 % SOLN Place 1 drop into both eyes 2 (two) times daily.  01/22/18   [provider]  morphine (MSIR) 15 MG tablet Take 0.5 tablets (7.5 mg total) by mouth every 4 (four) hours as needed for severe pain. 05/04/20   Deno Etienne, DO  Multiple Vitamin (MULTIVITAMIN WITH MINERALS) TABS tablet Take 1 tablet by mouth daily.    [provider]  polyethylene glycol powder (GLYCOLAX/MIRALAX) 17 GM/SCOOP powder Take 17 g by mouth daily.     [provider]  pravastatin (PRAVACHOL) 40 MG tablet Take 1 tablet (40 mg total) by mouth daily. 02/23/20   [name], MD  pregabalin (LYRICA) 25 MG capsule TAKE 1 CAPSULE(25 MG) BY MOUTH AT BEDTIME AS NEEDED 03/15/20   [name], MD  Semaglutide,0.25 or 0.5MG/DOS, (OZEMPIC, 0.25 OR 0.5 MG/DOSE,) 2 MG/1.5ML SOPN Inject 0.5 mg into the skin once a week. 04/28/19   Minette Brine, FNP  sildenafil (REVATIO) 20 MG tablet Take 1 tablet  by mouth as needed. MAX 3 TO 5 TABLETS PER DAY 01/06/20   [name], MD  tamsulosin (FLOMAX) 0.4 MG CAPS capsule Take 0.4 mg by mouth daily. 07/04/19   [provider]    Allergies    Gabapentin  Review of Systems   Review  of Systems  All other systems reviewed and are negative.   Physical Exam Updated Vital Signs BP 130/76   Pulse (!) 103   Temp (!) 97.4 F (36.3 C) (Oral)   Resp 18   SpO2 99%   Physical Exam Vitals and nursing note reviewed.  Constitutional:      General: He is not in acute distress.    Appearance: He is well-developed. He is not diaphoretic.  HENT:     Head: Normocephalic and atraumatic.  Cardiovascular:     Rate and Rhythm: Normal rate and regular rhythm.     Heart sounds: No murmur heard. No friction rub.  Pulmonary:     Effort: Pulmonary effort is normal. No respiratory distress.     Breath sounds: Normal breath sounds. No wheezing or rales.  Abdominal:     General: Bowel sounds are normal. There is no distension.     Palpations: Abdomen is soft.     Tenderness: There is no abdominal tenderness.  Musculoskeletal:        General: Normal range of motion.     Cervical back: Normal range of motion and neck supple.     Comments: The left knee appears grossly normal.  There is no palpable effusion.  There is no significant discomfort with range of motion.  Distal pulses, motor, and sensory are intact.  Skin:    General: Skin is warm and dry.  Neurological:     General: No focal deficit present.     Mental Status: He is alert and oriented to person, place, and time.     Coordination: Coordination normal.     ED  Results / Procedures / Treatments   Labs (all labs ordered are listed, but only abnormal results are displayed) Labs Reviewed  BASIC METABOLIC PANEL  CBC WITH DIFFERENTIAL/PLATELET    EKG None  Radiology No results found.  Procedures Procedures   Medications Ordered in ED Medications - No data to display  ED Course  I have reviewed the triage vital signs and the nursing notes.  Pertinent labs & imaging results that were available during my care of the patient were reviewed by me and considered in my medical decision making (see chart for details).    MDM Rules/Calculators/A&P  Patient is an 87 year old male with chronic renal insufficiency presenting for evaluation of fall.  Patient fell 5 days ago and fractured his right shoulder.  This evening his legs became weak again causing him to fall to the ground.  He was unable to be lifted up by his family and EMS was called.  X-ray of the patient's left knee is unremarkable and laboratory studies show worsening renal function.  He is azotemic with a BUN of 79 and creatinine of 4.2, both of which are above baseline.  These findings were discussed with Dr. Hollie Salk from nephrology.  She feels as though patient is appropriate for either admission or discharge and follow-up, however the family is uncomfortable with him returning home due to his instability with gait and propensity for falling.  Patient will be admitted to the hospitalist service under the care of Dr. Alcario Drought.  IV fluids initiated.  Final Clinical Impression(s) / ED Diagnoses Final diagnoses:  None    Rx / DC Orders ED Discharge Orders    None       Veryl Speak, MD 05/10/20 [phone]

## 2020-05-10 NOTE — Progress Notes (Signed)
NEW ADMISSION NOTE New Admission Note:   Arrival Method: From ED stretcher Mental Orientation: AAOx4 Telemetry: n/a Assessment: Completed Skin: Intact IV: LAC  Pain: 5/10 Tubes: n/a Safety Measures: Safety Fall Prevention Plan has been given, discussed and signed Admission: Completed [facility] Orientation: Patient has been orientated to the room, unit and staff.  Family: Wife [name] and daughter Letta Moynahan at bedside  Orders have been reviewed and implemented. Will continue to monitor the patient. Call light has been placed within reach and bed alarm has been activated.   Vira Agar, RN

## 2020-05-10 NOTE — Consult Note (Signed)
Justin Holloway Admit Date: 05/10/2020 05/10/2020 Justin Holloway  Requesting Physician:  Karmen Bongo   Reason for Consult:  Acute renal failure   HPI:  Justin Holloway is a 87 y.o. male who was admitted after presenting to the ED after a fall due to perceived weakness in his left knee.  Patient reports having a fall and not being able to get up afterwards.  EMS was called to evaluate the patient.  Patient was also noted to have worsening creatinine on admission with elevation to 4.13 for which nephrology was consulted.  Patient denies taking NSAIDs, reports limited fluid intake to 3 - 8oz glasses of water per day. He reports that he sees his nephrologist every 3 months and last appt was at the end of Mar. Patient states that he does not wish to start HD. He denies presence of dysuria. He denies having any recent illnesses    PMH Incudes: TIA  HTN  HLD  T2DM  CKD, Stage IV  Arthritis    Creatinine (no units)  Date Value  11/17/2019 2.4 (A)   Creatinine, Ser (mg/dL)  Date Value  05/10/2020 4.13 (H)  01/14/2020 3.25 (H)  09/10/2019 2.85 (H)  05/21/2019 3.16 (H)  02/26/2019 3.02 (H)  10/21/2018 3.45 (H)  08/29/2018 3.17 (H)  06/10/2018 2.77 (H)  12/26/2017 2.94 (H)  09/10/2017 2.73 (H)   I/Os:  ROS NSAIDS: declines  IV Contrast No known allergy, no recent imaging requiring contrast  TMP/SMX no recent abx use  Hypotension normally normotensive per patient  Balance of 12 systems is negative w/ exceptions as above  PMH  Past Medical History:  Diagnosis Date   Arthritis    CKD (chronic kidney disease), stage IV (HCC)    Diabetes mellitus without complication ([place])    Glaucoma    "blurred vision", see Dr Gershon Crane yearly   Hyperlipidemia    Hypertension    TIA (transient ischemic attack)    [facility]  Past Surgical History:  Procedure Laterality Date   INGUINAL HERNIA REPAIR Left 06/09/2013   Procedure: OPEN REPAIR LEFT INGUINAL HERNIA;  Surgeon: [name], MD;  Location: [facility];  Service: General;  Laterality: Left;   INSERTION OF MESH Left 06/09/2013   Procedure: INSERTION OF MESH;  Surgeon: [name], MD;  Location[facility];  Service: General;  Laterality: Left;   FH  Family History  Problem Relation Age of Onset   Cancer Mother 65       breast   COPD Father 72       Congestive heart failure   SH  reports that he has quit smoking. His smoking use included cigars. He has never used smokeless tobacco. He reports that he does not drink alcohol and does not use drugs. Allergies  Allergies  Allergen Reactions   Gabapentin Itching   Home medications Prior to Admission medications   Medication Sig Start Date End Date Taking? Authorizing Provider  Ascorbic Acid (VITAMIN C) 1000 MG tablet Take 1,000 mg by mouth daily.    Yes [provider]  aspirin EC 81 MG tablet Take 81 mg by mouth every evening.   Yes [provider]  Blood Glucose Monitoring Suppl (ONE TOUCH ULTRA 2) w/Device KIT Use as directed to check blood sugars 2 times per day dx: e11.22 04/28/19  Yes Minette Brine, FNP  cholecalciferol (VITAMIN D) 1000 units tablet Take 2,000 Units by mouth daily.    Yes [provider]  COMBIGAN 0.2-0.5 %  ophthalmic solution Place 1 drop into both eyes every 12 (twelve) hours. 09/01/19  Yes [provider]  diclofenac Sodium (VOLTAREN) 1 % GEL Apply 4 g topically 4 (four) times daily. Patient taking differently: Apply 2-4 g topically in the morning and at bedtime. shoulder 05/04/20  Yes Deno Etienne, DO  ferrous sulfate 325 (65 FE) MG tablet Take 325 mg by mouth daily with breakfast. 04/30/19  Yes [provider]  furosemide (LASIX) 80 MG tablet TAKE 1 TABLET BY MOUTH IN  THE MORNING AND ONE-HALF  TABLET BY MOUTH IN THE  EARLY EVENING Patient taking differently: Take 40-80 mg by mouth See admin instructions. 80 mg in the morning and 40 mg in the evening. 06/30/19  Yes [name], MD  glucose blood  (ONETOUCH ULTRA) test strip USE STRIPS TO TEST BLOOD  SUGAR 3 TIMES DAILY 1 STRIP PER TEST 06/26/19  Yes Minette Brine, FNP  hydrALAZINE (APRESOLINE) 10 MG tablet Take 10 mg by mouth 3 (three) times daily. 01/21/20  Yes [provider]  insulin degludec (TRESIBA) 200 UNIT/ML FlexTouch Pen Inject 23 Units into the skin at bedtime.    Yes [provider]  Lancets (ONETOUCH DELICA PLUS [id number]) [place] 100 each by Does not apply route in the morning, at noon, and at bedtime. 04/28/19  Yes Minette Brine, FNP  LUMIGAN 0.01 % SOLN Place 1 drop into both eyes 2 (two) times daily.  01/22/18  Yes [provider]  Multiple Vitamin (MULTIVITAMIN WITH MINERALS) TABS tablet Take 1 tablet by mouth daily.   Yes [provider]  polyethylene glycol powder (GLYCOLAX/MIRALAX) 17 GM/SCOOP powder Take 17 g by mouth daily as needed for mild constipation.   Yes [provider]  pravastatin (PRAVACHOL) 40 MG tablet Take 1 tablet (40 mg total) by mouth daily. Patient taking differently: Take 40 mg by mouth at bedtime. 02/23/20  Yes [name], MD  pregabalin (LYRICA) 25 MG capsule TAKE 1 CAPSULE(25 MG) BY MOUTH AT BEDTIME AS NEEDED Patient taking differently: Take 25 mg by mouth at bedtime. 03/15/20  Yes [name], MD  Semaglutide,0.25 or 0.5MG/DOS, (OZEMPIC, 0.25 OR 0.5 MG/DOSE,) 2 MG/1.5ML SOPN Inject 0.5 mg into the skin once a week. Patient taking differently: Inject 0.5 mg into the skin every Sunday. 04/28/19  Yes Minette Brine, FNP  tamsulosin (FLOMAX) 0.4 MG CAPS capsule Take 0.4 mg by mouth daily. 07/04/19  Yes [provider]  traMADol (ULTRAM) 50 MG tablet Take 50 mg by mouth every 6 (six) hours as needed for moderate pain. 05/06/20  Yes [provider]    Current Medications Scheduled Meds:  aspirin EC  81 mg Oral QPM   brimonidine  1 drop Both Eyes BID   And   timolol  1 drop Both Eyes BID   docusate sodium  100 mg Oral BID   enoxaparin  (LOVENOX) injection  30 mg Subcutaneous Q24H   hydrALAZINE  10 mg Oral TID   insulin aspart  0-9 Units Subcutaneous TID WC   insulin glargine  23 Units Subcutaneous QHS   latanoprost  1 drop Both Eyes QHS   pravastatin  40 mg Oral QHS   pregabalin  25 mg Oral QHS   tamsulosin  0.4 mg Oral Daily   Continuous Infusions:  lactated ringers     PRN Meds:.acetaminophen **OR** acetaminophen, bisacodyl, calcium carbonate (dosed in mg elemental calcium), camphor-menthol **AND** hydrOXYzine, docusate sodium, feeding supplement (NEPRO CARB STEADY), hydrALAZINE, ondansetron **OR** ondansetron (ZOFRAN) IV, polyethylene glycol, sorbitol,  traMADol, zolpidem  CBC Recent Labs  Lab 05/10/20 0353  WBC 11.2*  NEUTROABS 9.7*  HGB 9.5*  HCT 29.3*  MCV 91.3  PLT 319   Basic Metabolic Panel Recent Labs  Lab 05/10/20 0353  NA 134*  K 3.7  CL 98  CO2 21*  GLUCOSE 87  BUN 79*  CREATININE 4.13*  CALCIUM 8.7*    Physical Exam  Blood pressure 133/68, pulse 93, temperature (!) 97.4 F (36.3 C), temperature source Oral, resp. rate 20, SpO2 100 %.  GEN: elderly male in NAD sitting upright in bed, NAD  ENT: MMM without oral lesions noted  EYES: senile arcus, EOMI bilaterally  CV: RRR  PULM: CTAB without increased WOB or wheezing  ABD: some fullness to palpation, no tenderness SKIN: no rashes  EXT:BLE 2+ pitting, up to thighs    Assessment Justin Holloway is a 87 y.o. male presenting with advanced CKD to ESRD with GFR of 13 and worsening creatinine in setting of recent fracture and frequent falls.  Acute Kidney Injury in setting of advanced CKD, now stage 5 T2DM: per primary  HTN: mildly elevated 243O systolic   Plan 1.AKI on CKD, now stage 5 Avoid nephrotoxic agents Monitor Creatinine daily  Monitor UOP U/A HTN: relatively normotensive, monitor with vitals, would hold home losartan  T2DM,HLD: per primary    [facility]ay [phone] 05/10/2020, 10:01 AM

## 2020-05-10 NOTE — Evaluation (Signed)
Physical Therapy Evaluation Patient Details Name: Justin Holloway MRN: [id number] DOB: Feb 03, 1929 Today's Date: 05/10/2020   History of Present Illness  Pt is an 87 y/o male admitted 05/10/20 after fall at home left leg "gave out on him" (imaging clear). Also fell last week which resulted in Mildly impacted surgical neck fracture of the R humerus (follow up with OP Ortho). PMH includes Arthritis, CKD, Diabetes mellitus without complication, Glaucoma, Hyperlipidemia, HTN, and TIA.  Clinical Impression  Pt admitted with above. Presents with generalized weakness, decreased functional use of RUE, decreased activity tolerance, R shoulder pain, and poor balance. Requiring moderate assist to stand and unable to ambulate. Pt with bowel incontinence without awareness; totalA for peri care in standing position. Recommend ST SNF to address deficits, maximize functional mobility and decrease caregiver burden.     Follow Up Recommendations SNF    Equipment Recommendations  3in1 (PT);Wheelchair (measurements PT);Wheelchair cushion (measurements PT)    Recommendations for Other Services       Precautions / Restrictions Precautions Precautions: Fall Required Braces or Orthoses: Sling Restrictions Weight Bearing Restrictions: Yes RUE Weight Bearing: Non weight bearing      Mobility  Bed Mobility Overal bed mobility: Needs Assistance Bed Mobility: Supine to Sit;Sit to Supine     Supine to sit: Min assist Sit to supine: Mod assist   General bed mobility comments: Cues for sequencing, minA for trunk to upright, modA for elevating BLE's back into bed    Transfers Overall transfer level: Needs assistance Equipment used: None Transfers: Sit to/from Stand Sit to Stand: Mod assist         General transfer comment: ModA to rise from edge of bed x 2  Ambulation/Gait             General Gait Details: unable  Stairs            Wheelchair Mobility    Modified Rankin (Stroke  Patients Only)       Balance Overall balance assessment: Needs assistance Sitting-balance support: Feet supported Sitting balance-Leahy Scale: Fair Sitting balance - Comments: With challenge (i.e. LAQ), pt with posterior lean. Able to correct with verbal cueing   Standing balance support: No upper extremity supported;During functional activity Standing balance-Leahy Scale: Poor Standing balance comment: Bracing posteriorly against bed for external support                             Pertinent Vitals/Pain Pain Assessment: Faces Faces Pain Scale: Hurts even more Pain Location: R shoulder Pain Descriptors / Indicators: Aching;Guarding Pain Intervention(s): Limited activity within patient's tolerance;Monitored during session;Patient requesting pain meds-RN notified    Home Living Family/patient expects to be discharged to:: Private residence Living Arrangements: Spouse/significant other Available Help at Discharge: Family;Available PRN/intermittently (pt wife works part time) Type of Home: Apartment Home Access: Level entry     Home Layout: One [place]: [place];Cane - single point;Walker - 2 wheels      Prior Function Level of Independence: Independent with assistive device(s)         Comments: using cane vs walker     Hand Dominance   Dominant Hand: Right    Extremity/Trunk Assessment   Upper Extremity Assessment Upper Extremity Assessment: Defer to OT evaluation    Lower Extremity Assessment Lower Extremity Assessment: RLE deficits/detail;LLE deficits/detail RLE Deficits / Details: Increased edema. Grossly 4/5 strength except hip flexion 3/5 LLE Deficits / Details: Increased edema. Grossly  4/5 strength except hip flexion 3/5       Communication   Communication: No difficulties  Cognition Arousal/Alertness: Awake/alert Behavior During Therapy: WFL for tasks assessed/performed Overall Cognitive Status:  Impaired/Different from baseline Area of Impairment: Safety/judgement                         Safety/Judgement: Decreased awareness of deficits     General Comments: Unclear historian, pt wife/daughter filling in gaps      General Comments      Exercises General Exercises - Lower Extremity Long Arc Quad: Both;10 reps;Seated Toe Raises: Both;10 reps;Seated Heel Raises: Both;10 reps;Seated   Assessment/Plan    PT Assessment Patient needs continued PT services  PT Problem List Decreased strength;Decreased range of motion;Decreased activity tolerance;Decreased balance;Decreased mobility;Decreased cognition;Pain       PT Treatment Interventions DME instruction;Gait training;Functional mobility training;Therapeutic activities;Therapeutic exercise;Balance training;Patient/family education;Wheelchair mobility training    PT Goals (Current goals can be found in the Care Plan section)  Acute Rehab PT Goals Patient Stated Goal: pt family agreeable to rehab PT Goal Formulation: With patient/family Time For Goal Achievement: 05/24/20 Potential to Achieve Goals: Good    Frequency Min 3X/week   Barriers to discharge        Co-evaluation               AM-PAC PT "6 Clicks" Mobility  Outcome Measure Help needed turning from your back to your side while in a flat bed without using bedrails?: A Little Help needed moving from lying on your back to sitting on the side of a flat bed without using bedrails?: A Little Help needed moving to and from a bed to a chair (including a wheelchair)?: A Lot Help needed standing up from a chair using your arms (e.g., wheelchair or bedside chair)?: A Lot Help needed to walk in hospital room?: A Lot Help needed climbing 3-5 steps with a railing? : Total 6 Click Score: 13    End of Session Equipment Utilized During Treatment: Gait belt Activity Tolerance: Patient limited by fatigue Patient left: in bed;with call bell/phone within  reach;with bed alarm set;with family/visitor present Nurse Communication: Mobility status;Other (comment) (TED hose, pain medication) PT Visit Diagnosis: Unsteadiness on feet (R26.81);Muscle weakness (generalized) (M62.81);Difficulty in walking, not elsewhere classified (R26.2);History of falling (Z91.81)    Time: 1209-1239 PT Time Calculation (min) (ACUTE ONLY): 30 min   Charges:   PT Evaluation $PT Eval Moderate Complexity: 1 Mod PT Treatments $Therapeutic Activity: 8-22 mins        Wyona Almas, PT, DPT Acute Rehabilitation Services Pager [phone] Office [place] 05/10/2020, 3:00 PM

## 2020-05-10 NOTE — H&P (Signed)
History and Physical    Justin Holloway XHB:[id number] DOB: May 19, 1929 DOA: 05/10/2020  PCP: Justin Chard, MD Consultants:  Justin Holloway - orthopedics; nephrology Patient coming from:  Home - lives with wife; NOK: Wife, [phone]; [phone]  Chief Complaint: Falls  HPI: Justin Holloway is a 87 y.o. male with medical history significant of HTN; HLD; DM; and progressive CKD presenting with falls.  He fell last Tuesday and broke his shoulder.  He tried to get out of bed and landed on it.  Last night, he got up to the bathroom and was walking back to the couch when his left leg gave out and he fell again.  He has had left leg pain for a couple of days and had difficulty walking.  He has also been SOB since he broke his shoulder.  He has stage 4 CKD and they have been encouraging dietary compliance.  He would not proceed with HD.  No chest pain.  Saturday evening, they took off his compression stockings and his legs are badly swollen.  He may need placement for rehab, they would like to meet with [facility] team to discuss options.    ED Course:  Carryover, per Dr. Alcario Holloway:  Pt with generalized weakness, 2 falls this week. Broke shoulder.   Concerning however is that his CKD seems to have progressed. Sees Justin Holloway kidney already routinely. But now has Creat of 4.2 (looks more like a progressive worsening was 2, then 3, now 4) and BUN of 79 which gives him a GFR of 13. Justin Holloway thought it was probably "more like 9".  Nephro said give fluids and consult them in AM. Family may want to go the palliative route.  K is nl.    Review of Systems: As per HPI; otherwise review of systems reviewed and negative.   Ambulatory Status:  Ambulates with a cane or walker  COVID Vaccine Status:  Complete plus booster  Past Medical History:  Diagnosis Date  . Arthritis   . CKD (chronic kidney disease), stage IV ([place])   . Diabetes mellitus without complication ([place])   . Glaucoma    "blurred vision", see Dr  Justin Holloway yearly  . Hyperlipidemia   . Hypertension   . TIA (transient ischemic attack)     Past Surgical History:  Procedure Laterality Date  . INGUINAL HERNIA REPAIR Left 06/09/2013   Procedure: OPEN REPAIR LEFT INGUINAL HERNIA;  Surgeon: Justin Hector, MD;  Location: [place];  Service: General;  Laterality: Left;  . INSERTION OF MESH Left 06/09/2013   Procedure: INSERTION OF MESH;  Surgeon: Justin Hector, MD;  Location: [facility];  Service: General;  Laterality: Left;    Social History   Socioeconomic History  . Marital status: Married    Spouse name: Justin Holloway  . Number of children: 8  . Years of education: 29  . Highest education level: Not on file  Occupational History  . Occupation: retired  Tobacco Use  . Smoking status: Former Smoker    Types: Cigars  . Smokeless tobacco: Never Used  . Tobacco comment: socially, not long  Vaping Use  . Vaping Use: Never used  Substance and Sexual Activity  . Alcohol use: Never  . Drug use: Never  . Sexual activity: Not Currently  Other Topics Concern  . Not on file  Social History Narrative   Lives with wife at home    caffeine- coffee 1 cup daily   Social Determinants of Health   Financial Resource Strain: Medium  Risk  . Difficulty of Paying Living Expenses: Somewhat hard  Food Insecurity: No Food Insecurity  . Worried About Charity fundraiser in the Last Year: Never true  . Ran Out of Food in the Last Year: Never true  Transportation Needs: No Transportation Needs  . Lack of Transportation (Medical): No  . Lack of Transportation (Non-Medical): No  Physical Activity: Insufficiently Active  . Days of Exercise per Week: 7 days  . Minutes of Exercise per Session: 20 min  Stress: No Stress Concern Present  . Feeling of Stress : Not at all  Social Connections: Not on file  Intimate Partner Violence: Not on file    Allergies  Allergen Reactions  . Gabapentin Itching    Family History  Problem Relation Age of Onset  .  Cancer Mother 14       breast  . COPD Father 29       Congestive heart failure    Prior to Admission medications   Medication Sig Start Date End Date Taking? Authorizing Provider  Ascorbic Acid (VITAMIN C) 1000 MG tablet Take 1,000 mg by mouth daily.    Yes [provider]  aspirin EC 81 MG tablet Take 81 mg by mouth every evening.   Yes [provider]  Blood Glucose Monitoring Suppl (ONE TOUCH ULTRA 2) w/Device KIT Use as directed to check blood sugars 2 times per day dx: e11.22 04/28/19  Yes Justin Brine, FNP  cholecalciferol (VITAMIN D) 1000 units tablet Take 2,000 Units by mouth daily.    Yes [provider]  COMBIGAN 0.2-0.5 % ophthalmic solution Place 1 drop into both eyes every 12 (twelve) hours. 09/01/19  Yes [provider]  diclofenac Sodium (VOLTAREN) 1 % GEL Apply 4 g topically 4 (four) times daily. Patient taking differently: Apply 2-4 g topically in the morning and at bedtime. shoulder 05/04/20  Yes Justin Etienne, DO  ferrous sulfate 325 (65 FE) MG tablet Take 325 mg by mouth daily with breakfast. 04/30/19  Yes [provider]  furosemide (LASIX) 80 MG tablet TAKE 1 TABLET BY MOUTH IN  THE MORNING AND ONE-HALF  TABLET BY MOUTH IN THE  EARLY EVENING Patient taking differently: Take 40-80 mg by mouth See admin instructions. 80 mg in the morning and 40 mg in the evening. 06/30/19  Yes Justin Chard, MD  glucose blood (ONETOUCH ULTRA) test strip USE STRIPS TO TEST BLOOD  SUGAR 3 TIMES DAILY 1 STRIP PER TEST 06/26/19  Yes Justin Brine, FNP  hydrALAZINE (APRESOLINE) 10 MG tablet Take 10 mg by mouth 3 (three) times daily. 01/21/20  Yes [provider]  insulin degludec (TRESIBA) 200 UNIT/ML FlexTouch Pen Inject 23 Units into the skin at bedtime.    Yes [provider]  Lancets (ONETOUCH DELICA PLUS [id number]) [place] 100 each by Does not apply route in the morning, at noon, and at bedtime. 04/28/19  Yes Justin Brine, FNP  LUMIGAN 0.01  % SOLN Place 1 drop into both eyes 2 (two) times daily.  01/22/18  Yes [provider]  Multiple Vitamin (MULTIVITAMIN WITH MINERALS) TABS tablet Take 1 tablet by mouth daily.   Yes [provider]  polyethylene glycol powder (GLYCOLAX/MIRALAX) 17 GM/SCOOP powder Take 17 g by mouth daily as needed for mild constipation.   Yes [provider]  pravastatin (PRAVACHOL) 40 MG tablet Take 1 tablet (40 mg total) by mouth daily. Patient taking differently: Take 40 mg by mouth at bedtime. 02/23/20  Yes Justin Chard,  MD  pregabalin (LYRICA) 25 MG capsule TAKE 1 CAPSULE(25 MG) BY MOUTH AT BEDTIME AS NEEDED Patient taking differently: Take 25 mg by mouth at bedtime. 03/15/20  Yes Justin Chard, MD  Semaglutide,0.25 or 0.5MG/DOS, (OZEMPIC, 0.25 OR 0.5 MG/DOSE,) 2 MG/1.5ML SOPN Inject 0.5 mg into the skin once a week. Patient taking differently: Inject 0.5 mg into the skin every Sunday. 04/28/19  Yes Justin Brine, FNP  tamsulosin (FLOMAX) 0.4 MG CAPS capsule Take 0.4 mg by mouth daily. 07/04/19  Yes [provider]  traMADol (ULTRAM) 50 MG tablet Take 50 mg by mouth every 6 (six) hours as needed for moderate pain. 05/06/20  Yes [provider]  morphine (MSIR) 15 MG tablet Take 0.5 tablets (7.5 mg total) by mouth every 4 (four) hours as needed for severe pain. Patient not taking: No sig reported 05/04/20   Justin Etienne, DO  sildenafil (REVATIO) 20 MG tablet Take 1 tablet  by mouth as needed. MAX 3 TO 5 TABLETS PER DAY Patient not taking: No sig reported 01/06/20   Justin Chard, MD    Physical Exam: Vitals:   05/10/20 0815 05/10/20 0830 05/10/20 0852 05/10/20 0904  BP: 119/64 124/65  133/68  Pulse:    93  Resp: _0 Temp:   (!) 97.5 F (36.4 C) (!) 97.4 F (36.3 C)  TempSrc:   Oral Oral  SpO2:  99%  100%     . General:  Appears calm and comfortable and is in NAD . Eyes:   EOMI, normal lids, iris . ENT:  Hard of hearing, grossly normal lips & tongue,  mmm; artificial dentition . Neck:  no LAD, masses or thyromegaly . Cardiovascular:  RRR, no m/r/g. 2-3+ LE edema.  [name] Respiratory:   CTA bilaterally with no wheezes/rales/rhonchi.  Normal respiratory effort. . Abdomen:  soft, NT, ND . Skin:  no rash or induration seen on limited exam . Musculoskeletal:  Mildly decrased tone BUE/BLE, R shoulder in sling, L knee without obvious derangement . Psychiatric:  grossly normal mood and affect, speech fluent and appropriate, AOx3 . Neurologic:  CN 2-12 grossly intact, moves all extremities in coordinated fashion    Radiological Exams on Admission: Independently reviewed - see discussion in A/P where applicable  DG Knee Complete 4 Views Left  Result Date: 05/10/2020 CLINICAL DATA:  Left knee "gave out". EXAM: LEFT KNEE - COMPLETE 4+ VIEW COMPARISON:  None. FINDINGS: No evidence of an acute fracture or dislocation. Marked severity medial and lateral tibiofemoral compartment space narrowing is seen. Marked severity patellofemoral narrowing is also noted. There is a small to moderate sized knee effusion. IMPRESSION: 1. Marked severity degenerative changes without an acute osseous abnormality. 2. Small to moderate sized joint effusion. Electronically Signed   By: Virgina Norfolk M.D.   On: 05/10/2020 03:59    EKG: not done   Labs on Admission: I have personally reviewed the available labs and imaging studies at the time of the admission.  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  He was placed in a sling and told to f/u with Dr. Marcelino Holloway. -Meanwhile, yesterday his L knee was unable to support his weight and he fell again; negative knee xray -He has had some SOB -He does have worsening renal function and worsening LE edema; his progressive weakness may be related to his renal dysfunction -Will admit to Med Surg with PT/OT/ST/nutrition evaluations -Her family is providing 24/7 caregiver assistance and yet he is requiring more care over time; he may benefit from SNF placement for rehab and will place [facility] team consult -Will request palliative care evaluation for goals of care  Progressive CKD -h/o stage 4 CKD -Prior GFR in 01/2020 was 19, currently 13 -While there may be an acute component, he appears more hypervolemic than hypo -He was given Toradol during his recent ER visit and this may be contributing to his current worsening in renal function -Hold Voltaren gel and other potentially nephrotoxic agents -Hold Lasix for now pending nephrology consultation -Renal consult pending  HTN -Continue hydralazine  HLD -Continue Pravachol  DM -hold Semaglutide -Continue Tresiba -Cover with sensitive-scale SSI  Chronic pain -I have reviewed this patient in the [place] Controlled Substances Reporting System.   He is receiving medications from two providers but appears to be taking them as prescribed. -He is not at particularly high risk of opioid misuse, diversion, or overdose. -Continue Ultram, Lyrica (low dose)  Constipation -Patient reports constipation and requests medication; continue Miralax and add other prn medications  Obesity -BMI 34.95 -Weight loss should be encouraged -Outpatient PCP/bariatric medicine f/u encouraged  DNR -I have discussed code status with the patient and his family and  they are in agreement that the  patient would not desire resuscitation and would prefer to die a natural death should that situation arise. -He will need a gold out of facility DNR form at the time of discharge   Note: This patient has been tested and is negative for the novel coronavirus COVID-19. The patient has been fully vaccinated against COVID-19.   Level of care: Med-Surg DVT prophylaxis: Lovenox  Code Status:  DNR - confirmed with patient/family Family Communication: Wife and daughter were present throughout evaluation. Disposition Plan:  The patient is from: home  Anticipated d/c is to: SNF rehab  Anticipated d/c date will depend on clinical response to treatment, but likely 2-3 days  Patient is currently: acutely ill Consults called: Orthopedics; nephrology; palliative care; PT/OT/Nutrition/TOC team Admission status:  Admit - It is my clinical opinion that admission to INPATIENT is reasonable and necessary because of the expectation that this patient will require hospital care that crosses at least 2 midnights to treat this condition based on the medical complexity of the problems presented.  Given the aforementioned information, the predictability of an adverse outcome is felt to be significant.    Karmen Bongo MD [facility]   How to contact the [facility] Attending or Consulting provider [place] or covering provider during after hours [place], for this patient?  1. Check the care team in [facility] and [facility] for a) attending/consulting TR[facility] provider listed and b) the [facility] team listed 2. Log into [email or web address] and use [place]'s universal password to access. If you do not have the password, please contact the hospital operator. 3. Locate the [facility] provider you are looking for under [facility] and page to a number that you can be directly reached. 4. If you still have difficulty reaching the provider, please page the [facility] (Director on Call) for the Hospitalists listed on amion for assistance.   05/10/2020,  11:44 AM

## 2020-05-10 NOTE — Progress Notes (Signed)
Hypoglycemic Event  CBG: 60 at 1628  Treatment: 8 oz juice/soda and crackers   Symptoms: None  Follow-up CBG: Time: 1653  CBG Result: 65  Possible Reasons for Event: Unknown  Comments/MD notified:MD notified, given 1 amp of D50, ate all of dinner,  CBG [place]

## 2020-05-10 NOTE — Plan of Care (Signed)
  Problem: Education: Goal: Knowledge of General Education information will improve Description: Including pain rating scale, medication(s)/side effects and non-pharmacologic comfort measures Outcome: Progressing   Problem: Nutrition: Goal: Adequate nutrition will be maintained Outcome: Progressing   Problem: Pain Managment: Goal: General experience of comfort will improve Outcome: Progressing   Problem: Safety: Goal: Ability to remain free from injury will improve Outcome: Progressing   Problem: Education: Goal: Knowledge of disease and its progression will improve Outcome: Progressing   Problem: Fluid Volume: Goal: Compliance with measures to maintain balanced fluid volume will improve Outcome: Progressing   Problem: Nutritional: Goal: Ability to make healthy dietary choices will improve Outcome: Progressing

## 2020-05-10 NOTE — Consult Note (Signed)
Palliative Medicine Inpatient Consult Note  Reason for consult: Goals of Care  HPI:  Per intake H&P --> Justin Holloway is a 87 y.o. male with a PMH of TIA, CKD V, T2DM, HTN, HLD, arthritis, neuropathy.He was admitted after presenting to the ED after a fall due to perceived weakness in his left knee.  Patient reports having a fall and not being able to get up afterwards.   Palliative care was consulted for goals of care conversations in the setting of multiple comorbid conditions.  Clinical Assessment/Goals of Care:  *Please note that this is a verbal dictation therefore any spelling or grammatical errors are due to the "[place] [employer]" system interpretation.  I have reviewed medical records including EPIC notes, labs and imaging, received report from bedside RN, assessed the patient.    I met with Justin Holloway, his wife Justin Holloway, and his daughter Justin Holloway at bedside to further discuss diagnosis prognosis, GOC, EOL wishes, disposition and options.   I introduced Palliative Medicine as specialized medical care for people living with serious illness. It focuses on providing relief from the symptoms and stress of a serious illness. The goal is to improve quality of life for both the patient and the family.  Justin Holloway shares with me that he is from [place], [place].  Justin Holloway has lived here throughout his whole life.  He has been married to his wife Justin Holloway for the past 45 years.  Together they share 7 children and a multitude of grandchildren Justin Holloway was unable to remember the amount.  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  He is a man who enjoys playing pool and loves watching sports on the television.  Justin Holloway is a very religious man and a member of the [employer].  Prior to about a week ago Justin Holloway was fully mobile and able to conduct all basic activities of daily living on his own.  His wife shares with me that he was also still  driving regularly.  I asked Justin Holloway to tell me a little bit about his past medical history.  He shares with me that he has suffered from diabetes and neuropathy for many years now.  He shares with me that his neuropathy makes it difficult for him to remain in balance and sometimes he "staggers" when mobilizing. Justin Holloway does share that he uses both a cane and a walker at home.  We reviewed Justin Holloway's recent fall which resulted in a right humeral fracture for which he must now wear a sling.  We reviewed how much that can throw off your balance when one arm is unable to be utilized for mobility.  We further discussed that physical therapy will evaluate Justin Holloway during this hospital stay and may very likely recommend skilled nursing facility placement.  We discussed the differences between placement at a skilled nursing facility in terms of the amount of PT and OT which can be offered as compared to in the home environment.  I shared that in Justin Holloway's instance it may be useful for him to for a short time be at a skilled nursing facility as this would better provide the tools he may need moving forward for strengthening, balance, and education on safe movement.  We further reviewed his kidney disease discussed that diabetes and kidneys correlate with one another and his kidneys are functioning though not terribly well.  He and his wife share with me that they would never wish for him to be on  hemodialysis.  A detailed discussion was had today regarding advanced directives -Justin Holloway has never completed these though he is interested in doing so while he is in the hospital.  I shared with him that our chaplain on the palliative care service Justin Holloway will be able to offer support to complete these.  A copy of advance directives was provided to [name] and his family for review.    Concepts specific to code status, artifical feeding and hydration, continued IV antibiotics and rehospitalization was had.    A MOST form was also  provided to Justin Holloway and his family.  Justin Holloway is a DO NOT RESUSCITATE DO NOT INTUBATE CODE STATUS.  He and his family attest to not wanting aggressive/heroic measures to sustain life.  They are in favor of treating what is treatable.  I did discuss with Justin Holloway the idea of an outpatient palliative provider continuing to follow along with him as this may be of value to aid in symptom management and also continue goals of care conversations.  Both he and his family are in agreement with this.  Discussed the importance of continued conversation with family and their  medical providers regarding overall plan of care and treatment options, ensuring decisions are within the context of the patients values and GOCs.  Provided  "Hard Choices for [employer]" booklet.   Decision Maker: Justin Holloway: [phone]  SUMMARY OF RECOMMENDATIONS   DNAR/DNI  Appreciate spiritual care helping with advanced directives  Patient's personal goals are to optimize strength ideally at skilled nursing and then to go home.  He would like to remain as independent as possible for as long as possible  TOC - Outpatient palliative care consultation  From a spiritual perspective patient has his own pastor coming in - Justin Holloway ongoing support  Code Status/Advance Care Planning: DNAR/DNI   Symptom Management:  Muscular Weakness:                 - Physical Therapy Evaluation                 - Occupational Therapy Evaluation    Generalized Pain:                 - Tylenol Q6H PRN  Falls: - Physical activity - Ensure wearing eyeglasses or contact lenses - Ensure wearing hearing aid - Find out about the side effects of any medicine you take - Ensure adequate sleep - Stand up slowly - Use an assistive devices  - Wear non-skid, rubber-soled, low-heeled shoes, or lace-up shoes with non-skid soles that fully support feet.     Palliative Prophylaxis:   Oral care, mobility  Additional  Recommendations (Limitations, Scope, Preferences):  Continue to treat what is treatable   Psycho-social/Spiritual:   Desire for further Chaplaincy support:  Declined  Additional Recommendations:  Education on chronic comorbid conditions   Prognosis:  Unclear at this time  Discharge Planning:  Patient will likely discharge to skilled nursing with outpatient palliative support following along  Vitals:   05/10/20 0852 05/10/20 0904  BP:  133/68  Pulse:  93  Resp:  20  Temp: (!) 97.5 F (36.4 C) (!) 97.4 F (36.3 C)  SpO2:  100%   No intake or output data in the 24 hours ending 05/10/20 1108  Gen: Elderly African-American male in no acute distress HEENT: moist mucous membranes CV: Regular rate and rhythm PULM: On room air ABD: soft/nontender EXT: RUE in sling Neuro: Alert and oriented x3  PPS: 60%   This conversation/these recommendations were discussed with patient primary care team, Dr. Lorin Mercy  Time In: 1020 Time Out: 1130 Total Time: 70 Greater than 50%  of this time was spent counseling and coordinating care related to the above assessment and plan.  [place] Team Team Cell Phone: [phone] Please utilize secure chat with additional questions, if there is no response within 30 minutes please call the above phone number  Palliative Medicine Team providers are available by phone from 7am to 7pm daily and can be reached through the team cell phone.  Should this patient require assistance outside of these hours, please call the patient's attending physician.

## 2020-05-10 NOTE — Consult Note (Signed)
Reason for Consult:Left knee pain Referring Physician: Thomasenia Bottoms Time called: 0845 Time at bedside: [place] is an 87 y.o. male.  HPI: Justin Holloway was admitted last night after falling and couldn't get up. He has fallen twice now in the past 2 weeks. He injured his right shoulder in the first fall. He notes he has had pain in that knee for about 7 months now and that it just gives way on occasion. He has not been to see anyone about it. He denies prior e/o.  Past Medical History:  Diagnosis Date  . Arthritis   . CKD (chronic kidney disease), stage IV ([place])   . Diabetes mellitus without complication ([place])   . Glaucoma    "blurred vision", see Dr Gershon Crane yearly  . Hyperlipidemia   . Hypertension   . TIA (transient ischemic attack)     Past Surgical History:  Procedure Laterality Date  . INGUINAL HERNIA REPAIR Left 06/09/2013   Procedure: OPEN REPAIR LEFT INGUINAL HERNIA;  Surgeon: [name], MD;  Location: [facility];  Service: General;  Laterality: Left;  . INSERTION OF MESH Left 06/09/2013   Procedure: INSERTION OF MESH;  Surgeon: [name], MD;  Location: [place] [facility];  Service: General;  Laterality: Left;    Family History  Problem Relation Age of Onset  . Cancer Mother 53       breast  . COPD Father 109       Congestive heart failure    Social History:  reports that he has quit smoking. His smoking use included cigars. He has never used smokeless tobacco. He reports that he does not drink alcohol and does not use drugs.  Allergies:  Allergies  Allergen Reactions  . Gabapentin Itching    Medications: I have reviewed the patient's current medications.  Results for orders placed or performed during the hospital encounter of 05/10/20 (from the past 48 hour(s))  Basic metabolic panel     Status: Abnormal   Collection Time: 05/10/20  3:53 AM  Result Value Ref Range   Sodium 134 (L) 135 - 145 mmol/L   Potassium 3.7 3.5 - 5.1 mmol/L   Chloride 98 98 - 111 mmol/L    CO2 21 (L) 22 - 32 mmol/L   Glucose, Bld 87 70 - 99 mg/dL    Comment: Glucose reference range applies only to samples taken after fasting for at least 8 hours.   BUN 79 (H) 8 - 23 mg/dL   Creatinine, Ser 4.13 (H) 0.61 - 1.24 mg/dL   Calcium 8.7 (L) 8.9 - 10.3 mg/dL   GFR, Estimated 13 (L) >60 mL/min    Comment: (NOTE) Calculated using the CKD-EPI Creatinine Equation (2021)    Anion gap 15 5 - 15    Comment: Performed at [facility] [address] [place] [address]  CBC with Differential     Status: Abnormal   Collection Time: 05/10/20  3:53 AM  Result Value Ref Range   WBC 11.2 (H) 4.0 - 10.5 K/uL   RBC 3.21 (L) 4.22 - 5.81 MIL/uL   Hemoglobin 9.5 (L) 13.0 - 17.0 g/dL   HCT 29.3 (L) 39.0 - 52.0 %   MCV 91.3 80.0 - 100.0 fL   MCH 29.6 26.0 - 34.0 pg   MCHC 32.4 30.0 - 36.0 g/dL   RDW 12.7 11.5 - 15.5 %   Platelets 226 150 - 400 K/uL   nRBC 0.0 0.0 - 0.2 %  Neutrophils Relative % 87 %   Neutro Abs 9.7 (H) 1.7 - 7.7 K/uL   Lymphocytes Relative 5 %   Lymphs Abs 0.6 (L) 0.7 - 4.0 K/uL   Monocytes Relative 6 %   Monocytes Absolute 0.7 0.1 - 1.0 K/uL   Eosinophils Relative 1 %   Eosinophils Absolute 0.1 0.0 - 0.5 K/uL   Basophils Relative 0 %   Basophils Absolute 0.0 0.0 - 0.1 K/uL   Immature Granulocytes 1 %   Abs Immature Granulocytes 0.07 0.00 - 0.07 K/uL    Comment: Performed at [facility] [name], [place], [place] [address]  Resp Panel by RT-PCR (Flu A&B, Covid) Nasopharyngeal Swab     Status: None   Collection Time: 05/10/20  7:25 AM   Specimen: Nasopharyngeal Swab; Nasopharyngeal(NP) swabs in vial transport medium  Result Value Ref Range   SARS Coronavirus 2 by RT PCR NEGATIVE NEGATIVE    Comment: (NOTE) SARS-CoV-2 target nucleic acids are NOT DETECTED.  The SARS-CoV-2 RNA is generally detectable in upper respiratory specimens during the acute phase of infection. The lowest concentration of SARS-CoV-2 viral copies this assay can  detect is 138 copies/mL. A negative result does not preclude SARS-Cov-2 infection and should not be used as the sole basis for treatment or other patient management decisions. A negative result may occur with  improper specimen collection/handling, submission of specimen other than nasopharyngeal swab, presence of viral mutation(s) within the areas targeted by this assay, and inadequate number of viral copies(<138 copies/mL). A negative result must be combined with clinical observations, patient history, and epidemiological information. The expected result is Negative.  Fact Sheet for Patients:  EntrepreneurPulse.com.au  Fact Sheet for Healthcare Providers:  IncredibleEmployment.be  This test is no t yet approved or cleared by the [place] FDA and  has been authorized for detection and/or diagnosis of SARS-CoV-2 by FDA under an Emergency Use Authorization (EUA). This EUA will remain  in effect (meaning this test can be used) for the duration of the COVID-19 declaration under Section 564(b)(1) of the Act, 21 U.S.C.section 360bbb-3(b)(1), unless the authorization is terminated  or revoked sooner.       Influenza A by PCR NEGATIVE NEGATIVE   Influenza B by PCR NEGATIVE NEGATIVE    Comment: (NOTE) The Xpert Xpress SARS-CoV-2/FLU/RSV plus assay is intended as an aid in the diagnosis of influenza from Nasopharyngeal swab specimens and should not be used as a sole basis for treatment. Nasal washings and aspirates are unacceptable for Xpert Xpress SARS-CoV-2/FLU/RSV testing.  Fact Sheet for Patients: EntrepreneurPulse.com.au  Fact Sheet for Healthcare Providers: IncredibleEmployment.be  This test is not yet approved or cleared by the [place] FDA and has been authorized for detection and/or diagnosis of SARS-CoV-2 by FDA under an Emergency Use Authorization (EUA). This EUA will remain in effect  (meaning this test can be used) for the duration of the COVID-19 declaration under Section 564(b)(1) of the Act, 21 U.S.C. section 360bbb-3(b)(1), unless the authorization is terminated or revoked.  Performed at [facility], [place] [address] [place], [place] [address]     DG Knee Complete 4 Views Left  Result Date: 05/10/2020 CLINICAL DATA:  Left knee "gave out". EXAM: LEFT KNEE - COMPLETE 4+ VIEW COMPARISON:  None. FINDINGS: No evidence of an acute fracture or dislocation. Marked severity medial and lateral tibiofemoral compartment space narrowing is seen. Marked severity patellofemoral narrowing is also noted. There is a small to moderate sized knee effusion. IMPRESSION: 1.  Marked severity degenerative changes without an acute osseous abnormality. 2. Small to moderate sized joint effusion. Electronically Signed   By: Virgina Norfolk M.D.   On: 05/10/2020 03:59    Review of Systems  HENT: Negative for ear discharge, ear pain, hearing loss and tinnitus.   Eyes: Negative for photophobia and pain.  Respiratory: Negative for cough and shortness of breath.   Cardiovascular: Negative for chest pain.  Gastrointestinal: Negative for abdominal pain, nausea and vomiting.  Genitourinary: Negative for dysuria, flank pain, frequency and urgency.  Musculoskeletal: Positive for arthralgias (Left knee). Negative for back pain, myalgias and neck pain.  Neurological: Negative for dizziness and headaches.  Hematological: Does not bruise/bleed easily.  Psychiatric/Behavioral: The patient is not nervous/anxious.    Blood pressure 133/68, pulse 93, temperature (!) 97.4 F (36.3 C), temperature source Oral, resp. rate 20, SpO2 100 %. Physical Exam Constitutional:      General: He is not in acute distress.    Appearance: He is well-developed. He is not diaphoretic.  HENT:     Head: Normocephalic and atraumatic.  Eyes:     General: No scleral icterus.       Right eye: No discharge.        Left  eye: No discharge.     Conjunctiva/sclera: Conjunctivae normal.  Cardiovascular:     Rate and Rhythm: Normal rate and regular rhythm.  Pulmonary:     Effort: Pulmonary effort is normal. No respiratory distress.  Musculoskeletal:     Cervical back: Normal range of motion.     Comments: LLE No traumatic wounds, ecchymosis, or rash  Mod TTP, esp laterally  Mod knee effusion  Knee stable to varus/ valgus and anterior/posterior stress, some crepitus  Sens DPN, SPN, TN intact  Motor EHL, ext, flex, evers 5/5  DP 2+, PT 2+, No significant edema  Skin:    General: Skin is warm and dry.  Neurological:     Mental Status: He is alert.  Psychiatric:        Behavior: Behavior normal.     Assessment/Plan: Left knee pain -- I suspect his issues have to do with his relatively advanced arthritis. I have offered a knee brace that would help provide some stability but he has declined this. He has also declined TKA. Unfortunately I don't have anything else to offer. If he changes his mind would order "outside vendor brace" -> hinged knee brace -> unlocked to wear whenever ambulating. He may f/u with Dr. Lucia Gaskins in the office once discharged.    Lisette Abu, PA-C Orthopedic Surgery [phone] 05/10/2020, 10:10 AM

## 2020-05-11 ENCOUNTER — Inpatient Hospital Stay (HOSPITAL_COMMUNITY): Payer: Medicare Other

## 2020-05-11 ENCOUNTER — Ambulatory Visit: Payer: Medicare Other | Admitting: Internal Medicine

## 2020-05-11 DIAGNOSIS — R627 Adult failure to thrive: Secondary | ICD-10-CM | POA: Diagnosis not present

## 2020-05-11 LAB — CBC
HCT: 26 % — ABNORMAL LOW (ref 39.0–52.0)
Hemoglobin: 8.5 g/dL — ABNORMAL LOW (ref 13.0–17.0)
MCH: 30 pg (ref 26.0–34.0)
MCHC: 32.7 g/dL (ref 30.0–36.0)
MCV: 91.9 fL (ref 80.0–100.0)
Platelets: 246 10*3/uL (ref 150–400)
RBC: 2.83 MIL/uL — ABNORMAL LOW (ref 4.22–5.81)
RDW: 12.7 % (ref 11.5–15.5)
WBC: 10.1 10*3/uL (ref 4.0–10.5)
nRBC: 0 % (ref 0.0–0.2)

## 2020-05-11 LAB — GLUCOSE, CAPILLARY
Glucose-Capillary: 89 mg/dL (ref 70–99)
Glucose-Capillary: 175 mg/dL — ABNORMAL HIGH (ref 70–99)
Glucose-Capillary: 60 mg/dL — ABNORMAL LOW (ref 70–99)
Glucose-Capillary: 137 mg/dL — ABNORMAL HIGH (ref 70–99)
Glucose-Capillary: 129 mg/dL — ABNORMAL HIGH (ref 70–99)

## 2020-05-11 LAB — URINALYSIS, ROUTINE W REFLEX MICROSCOPIC
Bilirubin Urine: NEGATIVE
Glucose, UA: NEGATIVE mg/dL
Ketones, ur: NEGATIVE mg/dL
Nitrite: NEGATIVE
Protein, ur: NEGATIVE mg/dL
Specific Gravity, Urine: 1.014 (ref 1.005–1.030)
WBC, UA: >50 WBC/hpf — ABNORMAL HIGH (ref 0–5)
pH: 5 (ref 5.0–8.0)

## 2020-05-11 LAB — BASIC METABOLIC PANEL
Anion gap: 12 (ref 5–15)
BUN: 80 mg/dL — ABNORMAL HIGH (ref 8–23)
CO2: 22 mmol/L (ref 22–32)
Calcium: 8.3 mg/dL — ABNORMAL LOW (ref 8.9–10.3)
Chloride: 100 mmol/L (ref 98–111)
Creatinine, Ser: 3.95 mg/dL — ABNORMAL HIGH (ref 0.61–1.24)
GFR, Estimated: 14 mL/min — ABNORMAL LOW (ref >60)
Glucose, Bld: 116 mg/dL — ABNORMAL HIGH (ref 70–99)
Potassium: 3.6 mmol/L (ref 3.5–5.1)
Sodium: 134 mmol/L — ABNORMAL LOW (ref 135–145)

## 2020-05-11 MED ORDER — ADULT MULTIVITAMIN W/MINERALS CH
1.0000 | ORAL_TABLET | Freq: Every day | ORAL | Status: DC
  Administered 2020-05-11 – 2020-05-18 (×8): 1 via ORAL
  Filled 2020-05-11 (×8): qty 1

## 2020-05-11 MED ORDER — SODIUM CHLORIDE 0.9 % IV SOLN
1.0000 g | INTRAVENOUS | Status: DC
  Administered 2020-05-12: 1 g via INTRAVENOUS
  Filled 2020-05-11 (×2): qty 10

## 2020-05-11 MED ORDER — DEXTROSE 50 % IV SOLN
INTRAVENOUS | Status: AC
  Administered 2020-05-11: 50 mL
  Filled 2020-05-11: qty 50

## 2020-05-11 MED ORDER — INSULIN GLARGINE 100 UNIT/ML ~~LOC~~ SOLN
10.0000 [IU] | Freq: Every day | SUBCUTANEOUS | Status: DC
  Filled 2020-05-11 (×2): qty 0.1

## 2020-05-11 MED ORDER — PROSOURCE PLUS PO LIQD
30.0000 mL | Freq: Two times a day (BID) | ORAL | Status: DC
  Administered 2020-05-11 – 2020-05-18 (×15): 30 mL via ORAL
  Filled 2020-05-11 (×15): qty 30

## 2020-05-11 NOTE — Consult Note (Signed)
Justin Holloway Admit Date: 05/10/2020 05/11/2020 Justin Holloway  Requesting Physician:  Karmen Bongo   Reason for Consult:  Acute renal failure   HPI:  Justin Holloway is a 87 y.o. male who was admitted after presenting to the ED after a fall due to perceived weakness in his left knee.  Patient reports having a fall and not being able to get up afterwards.  EMS was called to evaluate the patient.  Patient was also noted to have worsening creatinine on admission with elevation to 4.13 for which nephrology was consulted.  Patient denies taking NSAIDs, reports limited fluid intake to 3 - 8oz glasses of water per day. He reports that he sees his nephrologist every 3 months and last appt was at the end of Mar. Patient states that he does not wish to start HD. He denies presence of dysuria. He denies having any recent illnesses.    Patient had no acute events overnight. Reports no new complaints this AM.   PMH Incudes:  TIA   HTN   HLD   T2DM   CKD, Stage IV   Arthritis    Creatinine (no units)  Date Value  11/17/2019 2.4 (A)   Creatinine, Ser (mg/dL)  Date Value  05/11/2020 3.95 (H)  05/10/2020 4.13 (H)  01/14/2020 3.25 (H)  09/10/2019 2.85 (H)  05/21/2019 3.16 (H)  02/26/2019 3.02 (H)  10/21/2018 3.45 (H)  08/29/2018 3.17 (H)  06/10/2018 2.77 (H)  12/26/2017 2.94 (H)   I/Os:  ROS NSAIDS: declines  IV Contrast No known allergy, no recent imaging requiring contrast  TMP/SMX no recent abx use  Hypotension normally normotensive per patient  Balance of 12 systems is negative w/ exceptions as above  PMH  Past Medical History:  Diagnosis Date  . Arthritis   . CKD (chronic kidney disease), stage IV ([place])   . Diabetes mellitus without complication ([place])   . Glaucoma    "blurred vision", see Dr Gershon Crane yearly  . Hyperlipidemia   . Hypertension   . TIA (transient ischemic attack)    PSH  Past Surgical History:  Procedure Laterality Date  .  INGUINAL HERNIA REPAIR Left 06/09/2013   Procedure: OPEN REPAIR LEFT INGUINAL HERNIA;  Surgeon: [name], MD;  Location: [facility];  Service: General;  Laterality: Left;  . INSERTION OF MESH Left 06/09/2013   Procedure: INSERTION OF MESH;  Surgeon: [name], MD;  Location: [facility];  Service: General;  Laterality: Left;   FH  Family History  Problem Relation Age of Onset  . Cancer Mother 21       breast  . COPD Father 54       Congestive heart failure   SH  reports that he has quit smoking. His smoking use included cigars. He has never used smokeless tobacco. He reports that he does not drink alcohol and does not use drugs. Allergies  Allergies  Allergen Reactions  . Gabapentin Itching   Home medications Prior to Admission medications   Medication Sig Start Date End Date Taking? Authorizing Provider  Ascorbic Acid (VITAMIN C) 1000 MG tablet Take 1,000 mg by mouth daily.    Yes [provider]  aspirin EC 81 MG tablet Take 81 mg by mouth every evening.   Yes [provider]  Blood Glucose Monitoring Suppl (ONE TOUCH ULTRA 2) w/Device KIT Use as directed to check blood sugars 2 times per day dx: e11.22 04/28/19  Yes Minette Brine, FNP  cholecalciferol (VITAMIN D)  1000 units tablet Take 2,000 Units by mouth daily.    Yes [provider]  COMBIGAN 0.2-0.5 % ophthalmic solution Place 1 drop into both eyes every 12 (twelve) hours. 09/01/19  Yes [provider]  diclofenac Sodium (VOLTAREN) 1 % GEL Apply 4 g topically 4 (four) times daily. Patient taking differently: Apply 2-4 g topically in the morning and at bedtime. shoulder 05/04/20  Yes Deno Etienne, DO  ferrous sulfate 325 (65 FE) MG tablet Take 325 mg by mouth daily with breakfast. 04/30/19  Yes [provider]  furosemide (LASIX) 80 MG tablet TAKE 1 TABLET BY MOUTH IN  THE MORNING AND ONE-HALF  TABLET BY MOUTH IN THE  EARLY EVENING Patient taking differently: Take 40-80 mg by mouth See admin  instructions. 80 mg in the morning and 40 mg in the evening. 06/30/19  Yes [name], MD  glucose blood (ONETOUCH ULTRA) test strip USE STRIPS TO TEST BLOOD  SUGAR 3 TIMES DAILY 1 STRIP PER TEST 06/26/19  Yes Minette Brine, FNP  hydrALAZINE (APRESOLINE) 10 MG tablet Take 10 mg by mouth 3 (three) times daily. 01/21/20  Yes [provider]  insulin degludec (TRESIBA) 200 UNIT/ML FlexTouch Pen Inject 23 Units into the skin at bedtime.    Yes [provider]  Lancets (ONETOUCH DELICA PLUS RFXJOI32P) [place] 100 each by Does not apply route in the morning, at noon, and at bedtime. 04/28/19  Yes Minette Brine, FNP  LUMIGAN 0.01 % SOLN Place 1 drop into both eyes 2 (two) times daily.  01/22/18  Yes [provider]  Multiple Vitamin (MULTIVITAMIN WITH MINERALS) TABS tablet Take 1 tablet by mouth daily.   Yes [provider]  polyethylene glycol powder (GLYCOLAX/MIRALAX) 17 GM/SCOOP powder Take 17 g by mouth daily as needed for mild constipation.   Yes [provider]  pravastatin (PRAVACHOL) 40 MG tablet Take 1 tablet (40 mg total) by mouth daily. Patient taking differently: Take 40 mg by mouth at bedtime. 02/23/20  Yes [name], MD  pregabalin (LYRICA) 25 MG capsule TAKE 1 CAPSULE(25 MG) BY MOUTH AT BEDTIME AS NEEDED Patient taking differently: Take 25 mg by mouth at bedtime. 03/15/20  Yes [name], MD  Semaglutide,0.25 or 0.5MG/DOS, (OZEMPIC, 0.25 OR 0.5 MG/DOSE,) 2 MG/1.5ML SOPN Inject 0.5 mg into the skin once a week. Patient taking differently: Inject 0.5 mg into the skin every Sunday. 04/28/19  Yes Minette Brine, FNP  tamsulosin (FLOMAX) 0.4 MG CAPS capsule Take 0.4 mg by mouth daily. 07/04/19  Yes [provider]  traMADol (ULTRAM) 50 MG tablet Take 50 mg by mouth every 6 (six) hours as needed for moderate pain. 05/06/20  Yes [provider]    Current Medications Scheduled Meds: . (feeding supplement) PROSource Plus  30 mL Oral BID  BM  . aspirin EC  81 mg Oral QPM  . brimonidine  1 drop Both Eyes BID   And  . timolol  1 drop Both Eyes BID  . docusate sodium  100 mg Oral BID  . enoxaparin (LOVENOX) injection  30 mg Subcutaneous Q24H  . hydrALAZINE  10 mg Oral TID  . insulin aspart  0-9 Units Subcutaneous TID WC  . insulin glargine  10 Units Subcutaneous QHS  . latanoprost  1 drop Both Eyes QHS  . multivitamin with minerals  1 tablet Oral Daily  . pravastatin  40 mg Oral QHS  . pregabalin  25 mg Oral QHS  . tamsulosin  0.4 mg Oral Daily  Continuous Infusions: . lactated ringers 50 mL/hr at 05/10/20 1045   CBC Recent Labs  Lab 05/10/20 0353 05/11/20 0406  WBC 11.2* 10.1  NEUTROABS 9.7*  --   HGB 9.5* 8.5*  HCT 29.3* 26.0*  MCV 91.3 91.9  PLT 226 770   Basic Metabolic Panel Recent Labs  Lab 05/10/20 0353 05/11/20 0406  NA 134* 134*  K 3.7 3.6  CL 98 100  CO2 21* 22  GLUCOSE 87 116*  BUN 79* 80*  CREATININE 4.13* 3.95*  CALCIUM 8.7* 8.3*    Physical Exam  Blood pressure (!) 96/53, pulse 83, temperature 98.7 F (37.1 C), temperature source Oral, resp. rate 18, SpO2 98 %.  General: male appearing stated age in no acute distress HEENT: MMM Cardio: Normal S1 and S2, no S3 or S4. Rhythm is regular.  Pulm: Clear to auscultation bilaterally, no crackles, wheezing, or diminished breath sounds. Normal respiratory effort, stable on RA Abdomen: Bowel sounds normal. Abdomen soft and non-tender.  Extremities: Bilateral LE edema, 2+ pitting Neuro: pt alert and oriented x4    Assessment Justin Holloway is a 87 y.o. male with slightly improved creatinine down to 3.95 from 4.13 yesterday, patient overall still feeling well.  1. Acute Kidney Injury in setting of advanced CKD, now stage 5 2. T2DM: per primary  3. HTN 4. BLE   Plan 1.AKI on CKD, now stage 5, improving 1. Avoid nephrotoxic agents 2. Continue to monitor serum Creatinine daily  3. Monitor UOP 4. U/A follow up once results available   5. F/u renal ultrasound 6. Creatinine appears to be improving on LR at 3m/hr, DC IVF and encourage PO hydration 7. Monitor K, WNL at this time  2. HTN: ranged between normotensive and hypotensive overnight, continue to hold antihypertensive medications  3. T2DM,HLD: per primary  4. BLE: will continue to hold off on home lasix and monitoring swelling, appears to be stable for now, will consider restarting home lasix once serum creatinine improved more   Justin Holloway [phone]/03/2020, 1:04 PM

## 2020-05-11 NOTE — Progress Notes (Signed)
Physical Therapy Treatment Patient Details Name: Justin Holloway MRN: [id number] DOB: [birth year] Today's Date: 05/11/2020    History of Present Illness Pt is an 87 y/o male admitted 05/10/20 after fall at home left leg "gave out on him" (imaging clear). Also fell last week which resulted in Mildly impacted surgical neck fracture of the R humerus (follow up with OP Ortho). PMH includes Arthritis, CKD, Diabetes mellitus without complication, Glaucoma, Hyperlipidemia, HTN, and TIA.    PT Comments    Pt supine in bed on entry with wife in room, agreeable to therapy. Pt limited in safe mobility by inability to use RUE for stabilizing as at baseline he uses a RW for ambulation. Pt is minA for bed mobility and modAx2 for transfers and ambulation. Increased cuing required for cane usage. Will attempt quad cane for increased stability in next session. D/c plans remain appropriate at this time. PT will continue to follow acutely.   Follow Up Recommendations  SNF     Equipment Recommendations  3in1 (PT);Wheelchair (measurements PT);Wheelchair cushion (measurements PT)       Precautions / Restrictions Precautions Precautions: Fall Required Braces or Orthoses: Sling Restrictions Weight Bearing Restrictions: Yes RUE Weight Bearing: Non weight bearing    Mobility  Bed Mobility Overal bed mobility: Needs Assistance Bed Mobility: Supine to Sit     Supine to sit: Min assist;HOB elevated Sit to supine: Mod assist   General bed mobility comments: pt able to navigate LE to EoB requires min A for bringing trunk to upright, increased time and effort to scoot hips to EoB    Transfers Overall transfer level: Needs assistance Equipment used: None Transfers: Sit to/from Stand Sit to Stand: Mod assist;+2 physical assistance         General transfer comment: modAx2 for coming to stand at [facility], found to be incontinent of stool in first stand, second stand for pericare and  ambulation  Ambulation/Gait Ambulation/Gait assistance: Mod assist;+2 safety/equipment;+2 physical assistance Gait Distance (Feet): 10 Feet Assistive device: Straight cane Gait Pattern/deviations: Step-to pattern;Decreased step length - right;Decreased step length - left;Trunk flexed;Shuffle Gait velocity: slowed Gait velocity interpretation: <1.31 ft/sec, indicative of household ambulator General Gait Details: modAx2 with close chair follow to ambulate from bed to door, vc for sequencing with cane and for upright posture, after ambulation educated on improved cane usage for support         Balance Overall balance assessment: Needs assistance Sitting-balance support: Feet supported Sitting balance-Leahy Scale: Fair Sitting balance - Comments: With challenge (i.e. LAQ), pt with posterior lean. Able to correct with verbal cueing   Standing balance support: No upper extremity supported;During functional activity Standing balance-Leahy Scale: Poor Standing balance comment: Bracing posteriorly against bed for external support, and then requiring outside support from therapist                            Cognition Arousal/Alertness: Awake/alert Behavior During Therapy: WFL for tasks assessed/performed Overall Cognitive Status: Impaired/Different from baseline Area of Impairment: Safety/judgement                         Safety/Judgement: Decreased awareness of deficits     General Comments: pt agreeable to therapy today, able to recall weightbearing status      Exercises General Exercises - Lower Extremity Long Arc Quad: Both;10 reps;Seated Toe Raises: Both;10 reps;Seated Heel Raises: Both;10 reps;Seated    General Comments General comments (skin integrity,  edema, etc.): VSS on RA      Pertinent Vitals/Pain Pain Assessment: 0-10 Pain Score: 4  Faces Pain Scale: Hurts little more Pain Location: R shoulder with movement Pain Descriptors / Indicators:  Aching;Guarding Pain Intervention(s): Limited activity within patient's tolerance;Monitored during session;Repositioned    Home Living Family/patient expects to be discharged to:: Private residence Living Arrangements: Spouse/significant other Available Help at Discharge: Family;Available PRN/intermittently (pt wife works part time) Type of Home: Apartment Home Access: Level entry   Home Layout: One [place]: [place];Cane - single point;Walker - 2 wheels      Prior Function Level of Independence: Independent with assistive device(s)      Comments: using cane vs walker; driving; wore velcro slides as shoes- reports independent with ADL/iADL   PT Goals (current goals can now be found in the care plan section) Acute Rehab PT Goals Patient Stated Goal: pt family agreeable to rehab PT Goal Formulation: With patient/family Time For Goal Achievement: 05/24/20 Potential to Achieve Goals: Good Progress towards PT goals: Progressing toward goals    Frequency    Min 3X/week      PT Plan Current plan remains appropriate       AM-PAC PT "6 Clicks" Mobility   Outcome Measure  Help needed turning from your back to your side while in a flat bed without using bedrails?: A Little Help needed moving from lying on your back to sitting on the side of a flat bed without using bedrails?: A Little Help needed moving to and from a bed to a chair (including a wheelchair)?: A Lot Help needed standing up from a chair using your arms (e.g., wheelchair or bedside chair)?: A Lot Help needed to walk in hospital room?: A Lot Help needed climbing 3-5 steps with a railing? : Total 6 Click Score: 13    End of Session Equipment Utilized During Treatment: Gait belt Activity Tolerance: Patient limited by fatigue Patient left: with call bell/phone within reach;with family/visitor present;in chair;with chair alarm set Nurse Communication: Mobility status PT Visit Diagnosis:  Unsteadiness on feet (R26.81);Muscle weakness (generalized) (M62.81);Difficulty in walking, not elsewhere classified (R26.2);History of falling (Z91.81)     Time: 8338-2505 PT Time Calculation (min) (ACUTE ONLY): 24 min  Charges:  $Gait Training: 8-22 mins $Therapeutic Activity: 8-22 mins                     Sherrel Shafer B. Migdalia Dk PT, DPT Acute Rehabilitation Services Pager [phone] Office [phone]    [place] 05/11/2020, 4:27 PM

## 2020-05-11 NOTE — Progress Notes (Signed)
Initial Nutrition Assessment  DOCUMENTATION CODES:   Obesity unspecified  INTERVENTION:  42m Prosource Plus po BID, each supplement provides 100 kcals and 15 grams of protein  MVI with minerals daily  NUTRITION DIAGNOSIS:   Inadequate oral intake related to chronic illness (CKD stage V) as evidenced by other (comment) (renal/carb mod diet insufficient to meet pt's nutritional needs).  GOAL:   Patient will meet greater than or equal to 90% of their needs  MONITOR:   PO intake,Supplement acceptance,Labs,Weight trends,I & O's  REASON FOR ASSESSMENT:   Consult Other (Comment) ("nutritional goals")  ASSESSMENT:   Pt admitted with FTT/generalized weakness s/p 2 recent falls. PMH includes TIA, CKD v, type 2 DM, HTN, HLD, arthritis, neuropathy.  Palliative Medicine met with pt/family yesterday and decision was made to transition pt to DNR, but pt/family would still like for the treatable to be treated without heroic measures. No decision regarding nutrition support indicated. Per PMT, pt's goal is to remain as independent as possible for as long as possible. Pt to have outpatient palliative services upon discharge to SNF.    Pt not in room at time of RD visit. Per RN, pt with excellent PO intake -- 100% meal completion so far.   Weight history reviewed. No significant weight changes noted.   No UOP documented.   Medications: colace, SSI TID w/ meals, 10 units lantus daily Labs: Na 134 (L), Cr 3.95 (H, down from yesterday) CBGs 89-60-175  Diet Order:   Diet Order            Diet renal/carb modified with fluid restriction Diet-HS Snack? Nothing; Fluid restriction: 1800 mL Fluid; Room service appropriate? Yes; Fluid consistency: Thin  Diet effective now                 EDUCATION NEEDS:   No education needs have been identified at this time  Skin:  Skin Assessment: Reviewed RN Assessment  Last BM:  5/2 type 6  Height:   Ht Readings from Last 1 Encounters:   05/04/20 5' 5"  (1.651 m)    Weight:   Wt Readings from Last 1 Encounters:  05/04/20 95.3 kg    BMI:  There is no height or weight on file to calculate BMI.  Estimated Nutritional Needs:   Kcal:  2000-2200  Protein:  100-120 grams  Fluid:  1.8L    ALarkin Ina MS, RD, LDN RD pager number and weekend/on-call pager number located in [phone]

## 2020-05-11 NOTE — Plan of Care (Signed)
  Problem: Education: Goal: Knowledge of General Education information will improve Description: Including pain rating scale, medication(s)/side effects and non-pharmacologic comfort measures Outcome: Progressing   Problem: Health Behavior/Discharge Planning: Goal: Ability to manage health-related needs will improve Outcome: Progressing   Problem: Clinical Measurements: Goal: Ability to maintain clinical measurements within normal limits will improve Outcome: Progressing   Problem: Activity: Goal: Risk for activity intolerance will decrease Outcome: Progressing   Problem: Nutrition: Goal: Adequate nutrition will be maintained Outcome: Progressing   Problem: Elimination: Goal: Will not experience complications related to bowel motility Outcome: Progressing   Problem: Pain Managment: Goal: General experience of comfort will improve Outcome: Progressing   

## 2020-05-11 NOTE — Progress Notes (Signed)
Hypoglycemic Event  CBG: 60 at 1110am  Treatment: D50 50 mL (25 gm)  Symptoms: None  Follow-up CBG: Time:1129  CBG Result:175  Possible Reasons for Event: Unknown  Comments/MD notified: MD notified, Will continue to monitor.    Vira Agar

## 2020-05-11 NOTE — Progress Notes (Signed)
This chaplain responded to PMT consult for creating or updating an Advance Directive.  The chaplain joined the Pt. and wife-Justin Holloway for AD education and completion of HCPOA.  The Pt. daughters-Justin Holloway and Justin Holloway joined the family and the Pt. AD was reviewed. The AD was left in the Pt. top drawer.  The chaplain will facilitate a notary and witnesses visit on Wednesday for notarizing the Pt. HCPOA.    This chaplain will F/U with spiritual care as needed.

## 2020-05-11 NOTE — Progress Notes (Signed)
Noted that UA results indicated many bacteria and WBC. Notified on call, Dr. Cyd Silence.

## 2020-05-11 NOTE — Progress Notes (Addendum)
PROGRESS NOTE    EASTIN SWING  WVP:[id number] DOB: [birth year] DOA: 05/10/2020 PCP: [name], MD  Brief Narrative: 87 year old chronically ill male with history of CKD stage IV, type 2 diabetes mellitus, hypertension, TIA, dyslipidemia, osteoarthritis usually uses a walker to ambulate fell last Tuesday and sustained a shoulder, humerus fracture, was seen in the ED and then referred to orthopedics, followed up with Dr. Marcelino Scot last week, was recommended to wear a sling, nonoperative management and CT to rule out pathological fracture.  He has had difficulty ambulating with a walker ever since, night prior to admission was walking back from the bathroom when his leg gave out and fell again, complains of worsening left knee pain since then -Due to weakness and multiple falls he presented to the ED on 5/2, labs notable for worsening creatinine of 4.2,  -X-ray of the left knee noted advanced degenerative changes  Assessment & Plan:   Multiple falls, knee pain Generalized weakness Adult failure to thrive -Recent right humerus surgical neck fracture, saw Dr. Marcelino Scot last week, recommended nonoperative management and sling, continue current sling, will order CT as suggested by Dr. Marcelino Scot -PT OT eval completed -TOC consulted for short-term rehab  AKI on CKD 4 -Baseline creatinine around 3, now 4.2 , suspect gradual progression of kidney disease -Slight improvement with hydration yesterday, fluids discontinued -Patient declines hemodialysis -Appreciate palliative care input, he is DNR, plan for palliative care services at discharge to rehab and then eventually will need hospice services -Diuretics on hold  Type 2 diabetes mellitus with hypoglycemia -Cut down insulin dose -Hold semaglutide  Chronic pain -Remains on Ultram and low-dose Lyrica  Constipation -Continue laxatives  DVT prophylaxis: Lovenox Code Status: DNR Family Communication: Discussed with daughter and wife at  bedside Disposition Plan:  Status is: Inpatient  Remains inpatient appropriate because:Inpatient level of care appropriate due to severity of illness   Dispo: The patient is from: Home              Anticipated d/c is to: SNF              Patient currently is not medically stable to d/c.   Difficult to place patient No  Consultants:   Palliative care, orthopedics, nephrology   Procedures:   Antimicrobials:    Subjective: -Feels okay today, was able to get to the side of the bed,  reports chronic shortness of breath  Objective: Vitals:   05/10/20 2125 05/11/20 0059 05/11/20 0444 05/11/20 1017  BP: 114/69 115/60 104/67 (!) 96/53  Pulse: 96 87 87 83  Resp: 16 16 18 18   Temp: 98.9 F (37.2 C) 98.1 F (36.7 C) 98.8 F (37.1 C) 98.7 F (37.1 C)  TempSrc: Oral Oral Oral Oral  SpO2: 97% 98% 98% 98%    Intake/Output Summary (Last 24 hours) at 05/11/2020 1028 Last data filed at 05/10/2020 2000 Gross per 24 hour  Intake 886.84 ml  Output --  Net 886.84 ml   There were no vitals filed for this visit.  Examination:  General exam: Chronically ill obese elderly male sitting up in bed, AAOx3, no distress HEENT: Neck obese unable to assess JVD CVS: S1-S2, regular rate rhythm Lungs: Decreased breath sounds to bases otherwise clear Abdomen: Soft, nontender, bowel sounds present Extremities: Right arm in sling, 1+ lower extremity edema Skin: No rashes on exposed skin Psychiatry:. Mood & affect appropriate.     Data Reviewed:   CBC: Recent Labs  Lab 05/10/20 0353 05/11/20 0406  WBC 11.2*  10.1  NEUTROABS 9.7*  --   HGB 9.5* 8.5*  HCT 29.3* 26.0*  MCV 91.3 91.9  PLT 226 010   Basic Metabolic Panel: Recent Labs  Lab 05/10/20 0353 05/11/20 0406  NA 134* 134*  K 3.7 3.6  CL 98 100  CO2 21* 22  GLUCOSE 87 116*  BUN 79* 80*  CREATININE 4.13* 3.95*  CALCIUM 8.7* 8.3*   GFR: Estimated Creatinine Clearance: 13.7 mL/min (A) (by C-G formula based on SCr of 3.95  mg/dL (H)). Liver Function Tests: No results for input(s): AST, ALT, ALKPHOS, BILITOT, PROT, ALBUMIN in the last 168 hours. No results for input(s): LIPASE, AMYLASE in the last 168 hours. No results for input(s): AMMONIA in the last 168 hours. Coagulation Profile: No results for input(s): INR, PROTIME in the last 168 hours. Cardiac Enzymes: No results for input(s): CKTOTAL, CKMB, CKMBINDEX, TROPONINI in the last 168 hours. BNP (last 3 results) No results for input(s): PROBNP in the last 8760 hours. HbA1C: No results for input(s): HGBA1C in the last 72 hours. CBG: Recent Labs  Lab 05/10/20 1628 05/10/20 1653 05/10/20 1754 05/10/20 2131 05/11/20 0647  GLUCAP 60* 65* 126* 173* 89   Lipid Profile: No results for input(s): CHOL, HDL, LDLCALC, TRIG, CHOLHDL, LDLDIRECT in the last 72 hours. Thyroid Function Tests: No results for input(s): TSH, T4TOTAL, FREET4, T3FREE, THYROIDAB in the last 72 hours. Anemia Panel: No results for input(s): VITAMINB12, FOLATE, FERRITIN, TIBC, IRON, RETICCTPCT in the last 72 hours. Urine analysis:    Component Value Date/Time   COLORURINE YELLOW 05/21/2019 1453   APPEARANCEUR HAZY (A) 05/21/2019 1453   LABSPEC 1.013 05/21/2019 1453   PHURINE 6.0 05/21/2019 1453   GLUCOSEU NEGATIVE 05/21/2019 1453   HGBUR NEGATIVE 05/21/2019 1453   BILIRUBINUR negative 09/10/2019 1647   KETONESUR NEGATIVE 05/21/2019 1453   PROTEINUR Negative 09/10/2019 1647   PROTEINUR NEGATIVE 05/21/2019 1453   UROBILINOGEN 0.2 09/10/2019 1647   UROBILINOGEN 0.2 06/04/2013 1313   NITRITE negative 09/10/2019 1647   NITRITE NEGATIVE 05/21/2019 1453   LEUKOCYTESUR Negative 09/10/2019 1647   LEUKOCYTESUR LARGE (A) 05/21/2019 1453   Sepsis Labs: @LABRCNTIP (procalcitonin:4,lacticidven:4)  ) Recent Results (from the past 240 hour(s))  Resp Panel by RT-PCR (Flu A&B, Covid) Nasopharyngeal Swab     Status: None   Collection Time: 05/10/20  7:25 AM   Specimen: Nasopharyngeal Swab;  Nasopharyngeal(NP) swabs in vial transport medium  Result Value Ref Range Status   SARS Coronavirus 2 by RT PCR NEGATIVE NEGATIVE Final    Comment: (NOTE) SARS-CoV-2 target nucleic acids are NOT DETECTED.  The SARS-CoV-2 RNA is generally detectable in upper respiratory specimens during the acute phase of infection. The lowest concentration of SARS-CoV-2 viral copies this assay can detect is 138 copies/mL. A negative result does not preclude SARS-Cov-2 infection and should not be used as the sole basis for treatment or other patient management decisions. A negative result may occur with  improper specimen collection/handling, submission of specimen other than nasopharyngeal swab, presence of viral mutation(s) within the areas targeted by this assay, and inadequate number of viral copies(<138 copies/mL). A negative result must be combined with clinical observations, patient history, and epidemiological information. The expected result is Negative.  Fact Sheet for Patients:  EntrepreneurPulse.com.au  Fact Sheet for Healthcare Providers:  IncredibleEmployment.be  This test is no t yet approved or cleared by the [place] FDA and  has been authorized for detection and/or diagnosis of SARS-CoV-2 by FDA under an Emergency Use Authorization (EUA). This EUA  will remain  in effect (meaning this test can be used) for the duration of the COVID-19 declaration under Section 564(b)(1) of the Act, 21 U.S.C.section 360bbb-3(b)(1), unless the authorization is terminated  or revoked sooner.       Influenza A by PCR NEGATIVE NEGATIVE Final   Influenza B by PCR NEGATIVE NEGATIVE Final    Comment: (NOTE) The Xpert Xpress SARS-CoV-2/FLU/RSV plus assay is intended as an aid in the diagnosis of influenza from Nasopharyngeal swab specimens and should not be used as a sole basis for treatment. Nasal washings and aspirates are unacceptable for Xpert Xpress  SARS-CoV-2/FLU/RSV testing.  Fact Sheet for Patients: EntrepreneurPulse.com.au  Fact Sheet for Healthcare Providers: IncredibleEmployment.be  This test is not yet approved or cleared by the [place] FDA and has been authorized for detection and/or diagnosis of SARS-CoV-2 by FDA under an Emergency Use Authorization (EUA). This EUA will remain in effect (meaning this test can be used) for the duration of the COVID-19 declaration under Section 564(b)(1) of the Act, 21 U.S.C. section 360bbb-3(b)(1), unless the authorization is terminated or revoked.  Performed at [facility], [place] [address], [place], [place] [address]          Radiology Studies: DG Knee Complete 4 Views Left  Result Date: 05/10/2020 CLINICAL DATA:  Left knee "gave out". EXAM: LEFT KNEE - COMPLETE 4+ VIEW COMPARISON:  None. FINDINGS: No evidence of an acute fracture or dislocation. Marked severity medial and lateral tibiofemoral compartment space narrowing is seen. Marked severity patellofemoral narrowing is also noted. There is a small to moderate sized knee effusion. IMPRESSION: 1. Marked severity degenerative changes without an acute osseous abnormality. 2. Small to moderate sized joint effusion. Electronically Signed   By: Virgina Norfolk M.D.   On: 05/10/2020 03:59        Scheduled Meds: . aspirin EC  81 mg Oral QPM  . brimonidine  1 drop Both Eyes BID   And  . timolol  1 drop Both Eyes BID  . docusate sodium  100 mg Oral BID  . enoxaparin (LOVENOX) injection  30 mg Subcutaneous Q24H  . hydrALAZINE  10 mg Oral TID  . insulin aspart  0-9 Units Subcutaneous TID WC  . insulin glargine  10 Units Subcutaneous QHS  . latanoprost  1 drop Both Eyes QHS  . pravastatin  40 mg Oral QHS  . pregabalin  25 mg Oral QHS  . tamsulosin  0.4 mg Oral Daily   Continuous Infusions: . lactated ringers 50 mL/hr at 05/10/20 1045     LOS: 1 day    Time spent:  51min  Domenic Polite, MD [facility]  05/11/2020, 10:28 AM

## 2020-05-11 NOTE — Evaluation (Signed)
Occupational Therapy Evaluation Patient Details Name: Justin Holloway MRN: [id number] DOB: [birth year] Today's Date: 05/11/2020    History of Present Illness Pt is an 87 y/o male admitted 05/10/20 after fall at home left leg "gave out on him" (imaging clear). Also fell last week which resulted in Mildly impacted surgical neck fracture of the R humerus (follow up with OP Ortho). PMH includes Arthritis, CKD, Diabetes mellitus without complication, Glaucoma, Hyperlipidemia, HTN, and TIA.   Clinical Impression   Pt PTA: Pt living with family and reports independence. Pt currently, limited by decreased strength, decreased ability to care for self and decreased activity tolerance. VSS on RA. Pt's RUE swelling and surgical neck fx of R humerus from previous fall last week. Pt education provided for RUE elbow, wrist and hand ROM due to swelling, correct sequence for donning sling and UB clothing. Pt's spouse in room. Pt would benefit from continued OT skilled services. OT following acutely.    Follow Up Recommendations  SNF    Equipment Recommendations  None recommended by OT    Recommendations for Other Services       Precautions / Restrictions Precautions Precautions: Fall Required Braces or Orthoses: Sling Restrictions Weight Bearing Restrictions: Yes RUE Weight Bearing: Non weight bearing      Mobility Bed Mobility Overal bed mobility: Needs Assistance Bed Mobility: Supine to Sit;Sit to Supine     Supine to sit: Min assist Sit to supine: Mod assist   General bed mobility comments: Cues for sequencing, minA for trunk to upright, modA for elevating BLE's back into bed    Transfers Overall transfer level: Needs assistance Equipment used: None Transfers: Sit to/from Stand Sit to Stand: Mod assist         General transfer comment: ModA for sit to stand    Balance Overall balance assessment: Needs assistance Sitting-balance support: Feet supported Sitting balance-Leahy  Scale: Fair     Standing balance support: No upper extremity supported;During functional activity Standing balance-Leahy Scale: Poor Standing balance comment: Bracing posteriorly against bed for external support                           ADL either performed or assessed with clinical judgement   ADL Overall ADL's : Needs assistance/impaired Eating/Feeding: Supervision/ safety   Grooming: Supervision/safety   Upper Body Bathing: Sitting;Moderate assistance   Lower Body Bathing: Moderate assistance;Sit to/from stand;Maximal assistance   Upper Body Dressing : Moderate assistance;Sitting   Lower Body Dressing: Maximal assistance;Sitting/lateral leans;Sit to/from stand;Cueing for safety   Toilet Transfer: Minimal assistance;Stand-pivot   Toileting- Clothing Manipulation and Hygiene: Maximal assistance;Sitting/lateral lean;Cueing for safety       Functional mobility during ADLs: Minimal assistance;Cueing for safety;Cane General ADL Comments: Pt limited by decreased strength, decreased ability to care for self and decreased activity tolerance. VSS on RA.     Vision Baseline Vision/History: No visual deficits Patient Visual Report: No change from baseline Vision Assessment?: No apparent visual deficits     Perception     Praxis      Pertinent Vitals/Pain Pain Assessment: 0-10 Pain Score: 4  Pain Location: R shoulder Pain Descriptors / Indicators: Aching;Guarding Pain Intervention(s): Monitored during session;Limited activity within patient's tolerance;Premedicated before session;Repositioned     Hand Dominance Right   Extremity/Trunk Assessment Upper Extremity Assessment Upper Extremity Assessment: Generalized weakness;RUE deficits/detail RUE Deficits / Details: impacted surgical neck fracture of the R humerus, elbow 0-55 flex/ext and wrist and hand with edema.  Lower Extremity Assessment Lower Extremity Assessment: Generalized weakness RLE Deficits /  Details: edema LLE Deficits / Details: edema   Cervical / Trunk Assessment Cervical / Trunk Assessment: Normal   Communication Communication Communication: No difficulties   Cognition Arousal/Alertness: Awake/alert Behavior During Therapy: WFL for tasks assessed/performed Overall Cognitive Status: Impaired/Different from baseline Area of Impairment: Safety/judgement                         Safety/Judgement: Decreased awareness of deficits     General Comments: Pt appears to be very agreeable and a man of few words, but he followed all commands and was aware of RUE deficits. Family in room and pt able to describe PLOF today.   General Comments  VSS on RA.    Exercises     Shoulder Instructions      Home Living Family/patient expects to be discharged to:: Private residence Living Arrangements: Spouse/significant other Available Help at Discharge: Family;Available PRN/intermittently (pt wife works part time) Type of Home: Apartment Home Access: Level entry     Home Layout: One level     Bathroom Shower/Tub: Tub/shower unit         [place]: [place];Cane - single point;Walker - 2 wheels          Prior Functioning/Environment Level of Independence: Independent with assistive device(s)        Comments: using cane vs walker; driving; wore velcro slides as shoes- reports independent with ADL/iADL        OT Problem List: Decreased strength;Decreased activity tolerance;Impaired balance (sitting and/or standing);Decreased coordination;Decreased cognition;Decreased safety awareness;Impaired UE functional use;Pain;Increased edema      OT Treatment/Interventions: Self-care/ADL training;Therapeutic exercise;Neuromuscular education;Energy conservation;Therapeutic activities;Cognitive remediation/compensation;Patient/family education;Balance training    OT Goals(Current goals can be found in the care plan section) Acute Rehab OT  Goals Patient Stated Goal: pt family agreeable to rehab OT Goal Formulation: With patient Time For Goal Achievement: 05/25/20 Potential to Achieve Goals: Good ADL Goals Pt Will Perform Grooming: with supervision;standing Pt Will Transfer to Toilet: with min guard assist;ambulating;bedside commode Pt/caregiver will Perform Home Exercise Program: Increased ROM;Right Upper extremity;With written HEP provided Additional ADL Goal #1: Pt will perform donning/doffing sling for RUE with minA overall. Additional ADL Goal #2: Pt will increase to supervisionA for bed mobility.  OT Frequency: Min 2X/week   Barriers to D/C: Decreased caregiver support  Pt' spouse had a spider bite on her leg and recent R hand so she is unable to assist pt       Co-evaluation              AM-PAC OT "6 Clicks" Daily Activity     Outcome Measure Help from another person eating meals?: None Help from another person taking care of personal grooming?: A Little Help from another person toileting, which includes using toliet, bedpan, or urinal?: A Lot Help from another person bathing (including washing, rinsing, drying)?: A Lot Help from another person to put on and taking off regular upper body clothing?: A Lot Help from another person to put on and taking off regular lower body clothing?: A Lot 6 Click Score: 15   End of Session Equipment Utilized During Treatment: Gait belt;Rolling walker Nurse Communication: Mobility status;Weight bearing status  Activity Tolerance: Patient tolerated treatment well Patient left: in chair;with call bell/phone within reach;with chair alarm set  OT Visit Diagnosis: Unsteadiness on feet (R26.81);Muscle weakness (generalized) (M62.81);Pain  Time: 6219-4712 OT Time Calculation (min): 55 min Charges:  OT General Charges $OT Visit: 1 Visit OT Evaluation $OT Eval Moderate Complexity: 1 Mod OT Treatments $Self Care/Home Management : 8-22 mins $Therapeutic  Activity: 8-22 mins $Therapeutic Exercise: 8-22 mins    Jefferey Pica, OTR/L Acute Rehabilitation Services Pager: [phone] Office: [place] 05/11/2020, 3:31 PM

## 2020-05-11 NOTE — Progress Notes (Signed)
HOSPITAL MEDICINE OVERNIGHT EVENT NOTE    Notified by nursing that urinalysis that has resulted at approximately 6 PM has resulted.  Of note, patient is hospitalized for frequent falls with recent right humerus neck fracture and acute kidney injury.  Nursing reports the patient has been complaining of dysuria in the absence of nausea vomiting abdominal pain or back pain.  Patient is afebrile.  Upon review of the urinalysis, it is suggestive of urinary tract infection.  Considering the above clinical information will place patient on intravenous ceftriaxone for now.  Justin Emerald  MD [facility]

## 2020-05-12 DIAGNOSIS — R627 Adult failure to thrive: Secondary | ICD-10-CM | POA: Diagnosis not present

## 2020-05-12 DIAGNOSIS — N179 Acute kidney failure, unspecified: Secondary | ICD-10-CM | POA: Diagnosis not present

## 2020-05-12 DIAGNOSIS — N184 Chronic kidney disease, stage 4 (severe): Secondary | ICD-10-CM

## 2020-05-12 LAB — GLUCOSE, CAPILLARY
Glucose-Capillary: 83 mg/dL (ref 70–99)
Glucose-Capillary: 88 mg/dL (ref 70–99)
Glucose-Capillary: 156 mg/dL — ABNORMAL HIGH (ref 70–99)
Glucose-Capillary: 195 mg/dL — ABNORMAL HIGH (ref 70–99)

## 2020-05-12 LAB — BASIC METABOLIC PANEL
Anion gap: 11 (ref 5–15)
BUN: 82 mg/dL — ABNORMAL HIGH (ref 8–23)
CO2: 22 mmol/L (ref 22–32)
Calcium: 8.1 mg/dL — ABNORMAL LOW (ref 8.9–10.3)
Chloride: 102 mmol/L (ref 98–111)
Creatinine, Ser: 3.53 mg/dL — ABNORMAL HIGH (ref 0.61–1.24)
GFR, Estimated: 16 mL/min — ABNORMAL LOW (ref >60)
Glucose, Bld: 84 mg/dL (ref 70–99)
Potassium: 3.3 mmol/L — ABNORMAL LOW (ref 3.5–5.1)
Sodium: 135 mmol/L (ref 135–145)

## 2020-05-12 LAB — CBC
HCT: 25.3 % — ABNORMAL LOW (ref 39.0–52.0)
Hemoglobin: 8.2 g/dL — ABNORMAL LOW (ref 13.0–17.0)
MCH: 29.8 pg (ref 26.0–34.0)
MCHC: 32.4 g/dL (ref 30.0–36.0)
MCV: 92 fL (ref 80.0–100.0)
Platelets: 258 10*3/uL (ref 150–400)
RBC: 2.75 MIL/uL — ABNORMAL LOW (ref 4.22–5.81)
RDW: 12.8 % (ref 11.5–15.5)
WBC: 8.5 10*3/uL (ref 4.0–10.5)
nRBC: 0 % (ref 0.0–0.2)

## 2020-05-12 MED ORDER — FUROSEMIDE 10 MG/ML IJ SOLN
80.0000 mg | Freq: Once | INTRAMUSCULAR | Status: AC
  Administered 2020-05-12: 80 mg via INTRAVENOUS
  Filled 2020-05-12: qty 8

## 2020-05-12 MED ORDER — POTASSIUM CHLORIDE CRYS ER 20 MEQ PO TBCR
20.0000 meq | EXTENDED_RELEASE_TABLET | Freq: Two times a day (BID) | ORAL | Status: DC
  Administered 2020-05-12 – 2020-05-13 (×3): 20 meq via ORAL
  Filled 2020-05-12 (×3): qty 1

## 2020-05-12 MED ORDER — INSULIN GLARGINE 100 UNIT/ML ~~LOC~~ SOLN
5.0000 [IU] | Freq: Two times a day (BID) | SUBCUTANEOUS | Status: DC
  Administered 2020-05-12 – 2020-05-18 (×12): 5 [IU] via SUBCUTANEOUS
  Filled 2020-05-12 (×14): qty 0.05

## 2020-05-12 MED ORDER — POLYETHYLENE GLYCOL 3350 17 G PO PACK
17.0000 g | PACK | Freq: Every day | ORAL | Status: AC
  Administered 2020-05-12 – 2020-05-13 (×2): 17 g via ORAL
  Filled 2020-05-12 (×2): qty 1

## 2020-05-12 MED ORDER — SODIUM CHLORIDE 0.9 % IV SOLN
1.0000 g | INTRAVENOUS | Status: AC
  Administered 2020-05-12 – 2020-05-13 (×2): 1 g via INTRAVENOUS
  Filled 2020-05-12 (×2): qty 1

## 2020-05-12 NOTE — Progress Notes (Addendum)
Justin Holloway Admit Date: 05/10/2020 05/12/2020 Justin Holloway   Subjective:   Patient continues to report feeling improved this AM, without new complaints.   PMH Incudes:  TIA   HTN   HLD   T2DM   CKD, Stage IV   Arthritis    Creatinine (no units)  Date Value  11/17/2019 2.4 (A)   Creatinine, Ser (mg/dL)  Date Value  05/12/2020 3.53 (H)  05/11/2020 3.95 (H)  05/10/2020 4.13 (H)  01/14/2020 3.25 (H)  09/10/2019 2.85 (H)  05/21/2019 3.16 (H)  02/26/2019 3.02 (H)  10/21/2018 3.45 (H)  08/29/2018 3.17 (H)  06/10/2018 2.77 (H)   I/Os:  ROS NSAIDS: declines  IV Contrast No known allergy, no recent imaging requiring contrast  TMP/SMX no recent abx use  Hypotension normally normotensive per patient  Balance of 12 systems is negative w/ exceptions as above  PMH  Past Medical History:  Diagnosis Date  . Arthritis   . CKD (chronic kidney disease), stage IV ([place])   . Diabetes mellitus without complication ([place])   . Glaucoma    "blurred vision", see Dr Gershon Crane yearly  . Hyperlipidemia   . Hypertension   . TIA (transient ischemic attack)    PSH  Past Surgical History:  Procedure Laterality Date  . INGUINAL HERNIA REPAIR Left 06/09/2013   Procedure: OPEN REPAIR LEFT INGUINAL HERNIA;  Surgeon: [name], MD;  Location: [facility];  Service: General;  Laterality: Left;  . INSERTION OF MESH Left 06/09/2013   Procedure: INSERTION OF MESH;  Surgeon: [name], MD;  Location: [facility];  Service: General;  Laterality: Left;   FH  Family History  Problem Relation Age of Onset  . Cancer Mother 81       breast  . COPD Father 63       Congestive heart failure   SH  reports that he has quit smoking. His smoking use included cigars. He has never used smokeless tobacco. He reports that he does not drink alcohol and does not use drugs. Allergies  Allergies  Allergen Reactions  . Gabapentin Itching   Home medications Prior to Admission medications    Medication Sig Start Date End Date Taking? Authorizing Provider  Ascorbic Acid (VITAMIN C) 1000 MG tablet Take 1,000 mg by mouth daily.    Yes [provider]  aspirin EC 81 MG tablet Take 81 mg by mouth every evening.   Yes [provider]  Blood Glucose Monitoring Suppl (ONE TOUCH ULTRA 2) w/Device KIT Use as directed to check blood sugars 2 times per day dx: e11.22 04/28/19  Yes Minette Brine, FNP  cholecalciferol (VITAMIN D) 1000 units tablet Take 2,000 Units by mouth daily.    Yes [provider]  COMBIGAN 0.2-0.5 % ophthalmic solution Place 1 drop into both eyes every 12 (twelve) hours. 09/01/19  Yes [provider]  diclofenac Sodium (VOLTAREN) 1 % GEL Apply 4 g topically 4 (four) times daily. Patient taking differently: Apply 2-4 g topically in the morning and at bedtime. shoulder 05/04/20  Yes Deno Etienne, DO  ferrous sulfate 325 (65 FE) MG tablet Take 325 mg by mouth daily with breakfast. 04/30/19  Yes [provider]  furosemide (LASIX) 80 MG tablet TAKE 1 TABLET BY MOUTH IN  THE MORNING AND ONE-HALF  TABLET BY MOUTH IN THE  EARLY EVENING Patient taking differently: Take 40-80 mg by mouth See admin instructions. 80 mg in the morning and 40 mg in the evening. 06/30/19  Yes [name], MD  glucose blood (ONETOUCH ULTRA) test strip USE STRIPS TO TEST BLOOD  SUGAR 3 TIMES DAILY 1 STRIP PER TEST 06/26/19  Yes Minette Brine, FNP  hydrALAZINE (APRESOLINE) 10 MG tablet Take 10 mg by mouth 3 (three) times daily. 01/21/20  Yes [provider]  insulin degludec (TRESIBA) 200 UNIT/ML FlexTouch Pen Inject 23 Units into the skin at bedtime.    Yes [provider]  Lancets (ONETOUCH DELICA PLUS [id number]) [place] 100 each by Does not apply route in the morning, at noon, and at bedtime. 04/28/19  Yes Minette Brine, FNP  LUMIGAN 0.01 % SOLN Place 1 drop into both eyes 2 (two) times daily.  01/22/18  Yes [provider]  Multiple Vitamin  (MULTIVITAMIN WITH MINERALS) TABS tablet Take 1 tablet by mouth daily.   Yes [provider]  polyethylene glycol powder (GLYCOLAX/MIRALAX) 17 GM/SCOOP powder Take 17 g by mouth daily as needed for mild constipation.   Yes [provider]  pravastatin (PRAVACHOL) 40 MG tablet Take 1 tablet (40 mg total) by mouth daily. Patient taking differently: Take 40 mg by mouth at bedtime. 02/23/20  Yes [name], MD  pregabalin (LYRICA) 25 MG capsule TAKE 1 CAPSULE(25 MG) BY MOUTH AT BEDTIME AS NEEDED Patient taking differently: Take 25 mg by mouth at bedtime. 03/15/20  Yes [name], MD  Semaglutide,0.25 or 0.5MG/DOS, (OZEMPIC, 0.25 OR 0.5 MG/DOSE,) 2 MG/1.5ML SOPN Inject 0.5 mg into the skin once a week. Patient taking differently: Inject 0.5 mg into the skin every Sunday. 04/28/19  Yes Minette Brine, FNP  tamsulosin (FLOMAX) 0.4 MG CAPS capsule Take 0.4 mg by mouth daily. 07/04/19  Yes [provider]  traMADol (ULTRAM) 50 MG tablet Take 50 mg by mouth every 6 (six) hours as needed for moderate pain. 05/06/20  Yes [provider]    Current Medications Scheduled Meds: . (feeding supplement) PROSource Plus  30 mL Oral BID BM  . aspirin EC  81 mg Oral QPM  . brimonidine  1 drop Both Eyes BID   And  . timolol  1 drop Both Eyes BID  . docusate sodium  100 mg Oral BID  . enoxaparin (LOVENOX) injection  30 mg Subcutaneous Q24H  . hydrALAZINE  10 mg Oral TID  . insulin aspart  0-9 Units Subcutaneous TID WC  . insulin glargine  5 Units Subcutaneous BID  . latanoprost  1 drop Both Eyes QHS  . multivitamin with minerals  1 tablet Oral Daily  . potassium chloride  20 mEq Oral BID  . pravastatin  40 mg Oral QHS  . pregabalin  25 mg Oral QHS  . tamsulosin  0.4 mg Oral Daily   Continuous Infusions: . cefTRIAXone (ROCEPHIN)  IV    . lactated ringers 10 mL/hr at 05/12/20 1003   CBC Recent Labs  Lab 05/10/20 0353 05/11/20 0406 05/12/20 0142  WBC 11.2* 10.1 8.5   NEUTROABS 9.7*  --   --   HGB 9.5* 8.5* 8.2*  HCT 29.3* 26.0* 25.3*  MCV 91.3 91.9 92.0  PLT 226 246 732   Basic Metabolic Panel Recent Labs  Lab 05/10/20 0353 05/11/20 0406 05/12/20 0142  NA 134* 134* 135  K 3.7 3.6 3.3*  CL 98 100 102  CO2 21* 22 22  GLUCOSE 87 116* 84  BUN 79* 80* 82*  CREATININE 4.13* 3.95* 3.53*  CALCIUM 8.7* 8.3* 8.1*    Physical Exam  Blood pressure (!) 110/56, pulse 76, temperature 98  F (36.7 C), temperature source Oral, resp. rate 18, SpO2 98 %.  General: male appearing stated age in no acute distress Cardio: Normal S1 and S2, no S3 or S4. Rhythm is regular.  Pulm: Clear to auscultation bilaterally, no crackles, wheezing, or diminished breath sounds. Normal respiratory effort, stable on RA Abdomen: Bowel sounds normal. Abdomen soft and non-tender. Extremities: No peripheral edema. Warm/ well perfused.  Neuro: pt alert and oriented x4   Assessment Justin Holloway is a 87 y.o. male with slightly improved creatinine down to 3.95 from 4.13 yesterday, patient overall still feeling well.  1. Acute Kidney Injury in setting of advanced CKD 2. T2DM 3. HTN 4. BLE   Plan 1.AKI on CKD, improving 1. Avoid nephrotoxic agents 2. Continue to monitor serum Creatinine daily, continues to downtrend from 3.95 to 3.5 today  3. Monitor UOP 4. Monitor K, WNL at this time  2. HTN: ok to restart antihypertensive medications per primary  3. T2DM,HLD: per primary  4. BLE: will give one time dose of 80 lasix to help with diuresis   Nephrology will sign off at this time.   Justin Holloway [phone] 05/12/2020, 10:49 AM

## 2020-05-12 NOTE — TOC Initial Note (Signed)
Transition of Care [facility]) - Initial/Assessment Note    Patient Details  Name: Justin Holloway MRN: [id number] Date of Birth: 06/12/1929  [facility] CM/SW Contact:    Sable Feil, LCSW Phone Number: 05/12/2020, 4:38 PM  Clinical Narrative: CSW talked with patient and wife [name] at the bedside regarding recommendation of ST rehab. When asked, CSW informed that patient has never been to a skilled nursing facility for [facility] rehab. CSW explained the facility search process and ST rehab to patient and wife and their questions answered. Medicare.gov SNF list given to wife. CSW noted in Epic that patient has been vaccinated.  Patient and wife advised that they will be provided with facility responses and from there they can research facilities on Medicare.gov to assist them in making a decision. Mrs. Froman did advised CSW that she is interested in [facility], as it is close.                  Expected Discharge Plan: Skilled Nursing Facility Barriers to Discharge: Continued Medical Work up   Patient Goals and CMS Choice   CMS Medicare.gov Compare Post Acute Care list provided to:: Patient Represenative (must comment) (Given to wife) Choice offered to / list presented to : [facility]  Expected Discharge Plan and Services Expected Discharge Plan: Skilled Nursing Facility In-house Referral: Clinical Social Work     Living arrangements for the past 2 months: Apartment                                      Prior Living Arrangements/Services Living arrangements for the past 2 months: Apartment Lives with:: Spouse Patient language and need for interpreter reviewed:: Yes Do you feel safe going back to the place where you live?: No   Patient and wife agreeable to [facility] rehab  Need for Family Participation in Patient Care: Yes (Comment) Care giver support system in place?: Yes (comment)   Criminal Activity/Legal Involvement Pertinent to Current  Situation/Hospitalization: No - Comment as needed  Activities of Daily Living Home Assistive Devices/Equipment: Walker (specify type),Cane (specify quad or straight) ADL Screening (condition at time of admission) Patient's cognitive ability adequate to safely complete daily activities?: Yes Is the patient deaf or have difficulty hearing?: No Does the patient have difficulty seeing, even when wearing glasses/contacts?: No Does the patient have difficulty concentrating, remembering, or making decisions?: No Patient able to express need for assistance with ADLs?: Yes Does the patient have difficulty dressing or bathing?: Yes Independently performs ADLs?: No Does the patient have difficulty walking or climbing stairs?: Yes Weakness of Legs: Both Weakness of Arms/Hands: Both  Permission Sought/Granted Permission sought to share information with : Family Supports Permission granted to share information with : Yes, Verbal Permission Granted  Share Information with NAME: Artem Bunte     Permission granted to share info w Relationship: Wife  Permission granted to share info w Contact Information: [phone]  Emotional Assessment Appearance:: Appears stated age Attitude/Demeanor/Rapport: Engaged Affect (typically observed): Pleasant Orientation: : Oriented to Self,Oriented to Place,Oriented to  Time,Oriented to Situation Alcohol / Substance Use: Never Used Psych Involvement: No (comment)  Admission diagnosis:  Weakness [R53.1] Failure to thrive in adult [R62.7] Generalized weakness [R53.1] Acute renal failure superimposed on chronic kidney disease, unspecified CKD stage, unspecified acute renal failure type ([place]) [N17.9, N18.9] Patient Active Problem List   Diagnosis Date Noted  . Generalized weakness 05/10/2020  .  Failure to thrive in adult 05/10/2020  . Chronic pain 05/10/2020  . Constipation 05/10/2020  . 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 DNR (do not resuscitate) 05/10/2020  . Hypertension   . Hyperlipidemia   . Glaucoma   . CKD (chronic kidney disease), stage IV ([place])   . Secondary renal hyperparathyroidism ([place]) 01/14/2020  . Type 2 diabetes mellitus with stage 4 chronic kidney disease, with long-term current use of insulin ([place]) 09/10/2019  . Peripheral vascular disease, unspecified ([place]) 05/28/2019  . Diabetic neuropathy ([place]) 02/18/2019  . Left upper quadrant pain 09/04/2018  . Acute midline low back pain without sciatica 09/04/2018  . CKD (chronic kidney disease) stage 4, GFR 15-29 ml/min ([facility]) 11/21/2017  . Hypertensive nephropathy 11/21/2017  . Right foot pain 11/21/2017  . Vertigo 09/09/2017  . BPPV (benign paroxysmal positional vertigo), right 09/06/2017  . Sensorineural hearing loss (SNHL), bilateral 09/06/2017  . Ataxia 02/06/2017  . UTI (lower urinary tract infection) 08/07/2015  . Dizziness 08/07/2015  . Acute renal failure superimposed on stage 3 chronic kidney disease ([place]) 08/07/2015  . Hyperkalemia 08/07/2015  . Left inguinal hernia 05/15/2013  . Diabetes mellitus without complication ([place]) 02/40/9735  . HLD (hyperlipidemia) 03/08/2006  . HYPERTENSION, BENIGN SYSTEMIC 03/08/2006  . 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 PROTEINURIA 03/08/2006   PCP:  [name], MD Pharmacy:   [facility] [place], [address] [address] [place] [address] Phone: [phone] Fax: [place], [address] [place] [address] Phone: [phone] Fax: [phone]  [place], [address], [address] [place], [place] [address] Phone: [phone] Fax: [place], [place] [address] [place]  [address] Phone: [phone] Fax: [phone]     Social Determinants of Health (SDOH) Interventions  None requested or needed at this time.   Readmission Risk Interventions No flowsheet data found.

## 2020-05-12 NOTE — Progress Notes (Signed)
BG last night was 129. Held Lantus and gave him a snack before bed due to recent BG decreases noted on 5/3. BG this morning is 83.

## 2020-05-12 NOTE — Progress Notes (Signed)
Pt up in the chair. 2 assist with walker.   Paulla Fore, RN, BSN

## 2020-05-12 NOTE — Progress Notes (Signed)
[facility] PROGRESS NOTE    Progress Note  Justin Holloway  NOM:[id number] DOB: 1929/12/01 DOA: 05/10/2020 PCP: [name], MD     Brief Narrative:   Justin Holloway is an 87 y.o. male past medical history chronic kidney disease stage IV, diabetes mellitus type 2, essential hypertension, TIA usually uses a walker to ambulate felt and sustained a fracture in the shoulder and humerus was seen in the ED and referred for orthopedic surgery as an outpatient with Dr. Marcelino Scot, he recommended to wear a sling and no operative management.  CT to rule out pathologic fracture.  Since then she has been having difficulty ambulating fell at home on 05/10/2020 labs noted for worsening acute kidney injury x-ray of the left knee noted advanced degenerative changes.  Patient is awaiting skilled nursing facility placement.  Assessment/Plan:   Generalized weakness/failure to thrive in adult: Conservative management for recent humerus fracture. Physical therapy evaluated the patient who recommended skilled nursing facility placement. CT of the humerus showed a 2 part fracture of the right proximal humerus nondisplaced no signs of pathologic fracture. TOC has been consulted for short-term rehab.  Acute kidney injury on chronic kidney disease stage IV: With a baseline creatinine around 3 on admission 4.2. She was started on IV fluids and her creatinine has improved close to baseline. She has refused dialysis in the past she relates she still does not want dialysis.  Diabetes mellitus type 2 with hyperglycemia: Continue current regimen blood glucose fairly controlled.  Chronic pain: Continue Ultram and Lyrica.  Constipation: Continue laxative   DVT prophylaxis: lovenox Family Communication: Son and wife Status is: Inpatient  Remains inpatient appropriate because:Hemodynamically unstable   Dispo: The patient is from: Home              Anticipated d/c is to: SNF              Patient  currently is not medically stable to d/c.   Difficult to place patient No        Code Status:     Code Status Orders  (From admission, onward)         Start     Ordered   05/10/20 0842  Do not attempt resuscitation (DNR)  Continuous       Question Answer Comment  In the event of cardiac or respiratory ARREST Do not call a "code blue"   In the event of cardiac or respiratory ARREST Do not perform Intubation, CPR, defibrillation or ACLS   In the event of cardiac or respiratory ARREST Use medication by any route, position, wound care, and other measures to relive pain and suffering. May use oxygen, suction and manual treatment of airway obstruction as needed for comfort.      05/10/20 0844        Code Status History    Date Active Date Inactive Code Status Order ID Comments User Context   09/09/2017 1431 09/10/2017 1721 Full Code [id number]  Cristal Deer, MD Inpatient   08/07/2015 2006 08/09/2015 1748 Full Code [id number]  Ivor Costa, MD ED   Advance Care Planning Activity        IV Access:    Peripheral IV   Procedures and diagnostic studies:   CT HUMERUS RIGHT WO CONTRAST  Result Date: 05/11/2020 CLINICAL DATA:  Humeral fracture EXAM: CT OF THE RIGHT HUMERUS WITHOUT CONTRAST TECHNIQUE: Multidetector CT imaging was performed according to the standard protocol. Multiplanar CT image reconstructions were also generated. COMPARISON:  05/04/2020  radiograph FINDINGS: Bones/Joint/Cartilage Impacted fracture of the surgical neck of the humerus with apex anterior angulation and 2.2 cm of displacement. Mild comminution. A fracture plane extends through the greater tuberosity but is not displaced and hence does not count as a separate fragment in the Neer classification. Accordingly this is a Neer 2 part fracture. There is a 0.6 by 0.6 cm cortical fragment imbedded along the fracture plane on image 29 of series 6. Glenohumeral spurring.  No other humeral fracture is identified.  Ligaments Suboptimally assessed by CT. Muscles and Tendons Indistinctness of tissue planes along the fracture site particularly along the adjacent subscapularis muscle. Soft tissues Linear subsegmental atelectasis or scarring in the left lower lobe. IMPRESSION: 1. Neer 2 part fracture of the right proximal humerus as described above. The fracture plane extending through the greater tuberosity is relatively nondisplaced thus does not count as a separate "part" under the Neer classification. 2. Degenerative glenohumeral spurring. Electronically Signed   By: Van Clines M.D.   On: 05/11/2020 14:25   US RENAL  Result Date: 05/11/2020 CLINICAL DATA:  Acute renal failure. EXAM: RENAL / URINARY TRACT ULTRASOUND COMPLETE COMPARISON:  CT 10/21/2018. FINDINGS: Right Kidney: Renal measurements: 8.5 x 4.7 x 4.3 cm = volume: 88.7 mL. Cortical thinning. Mild increased echogenicity. No mass or hydronephrosis visualized. Left Kidney: Renal measurements: 9.7 x 5.5 x 4.7 cm = volume: 129.2 mL. Difficult visualized. Mild increased echogenicity. No mass or hydronephrosis visualized. Bladder: Appears normal for degree of bladder distention. Other: None. IMPRESSION: 1. Mild increased echogenicity both kidneys suggesting chronic medical renal disease. Right renal cortical thinning. 2.  No acute abnormality.  No hydronephrosis or bladder distention. Electronically Signed   By: Marcello Moores  Register   On: 05/11/2020 16:28     Medical Consultants:    None.   Subjective:    Justin Holloway has no new complaints feels great.  Objective:    Vitals:   05/11/20 1651 05/11/20 2030 05/12/20 0443 05/12/20 0910  BP: (!) 105/59 (!) 111/57 (!) 102/54 (!) 110/56  Pulse: 78 79 78 76  Resp: 18 17 17 18   Temp: 98 F (36.7 C) 98 F (36.7 C) (!) 97.5 F (36.4 C) 98 F (36.7 C)  TempSrc:  Oral Oral Oral  SpO2: 100% 100% 98% 98%   SpO2: 98 %   Intake/Output Summary (Last 24 hours) at 05/12/2020 1210 Last data filed at  05/12/2020 0900 Gross per 24 hour  Intake 1300 ml  Output 1125 ml  Net 175 ml   There were no vitals filed for this visit.  Exam: General exam: In no acute distress. Respiratory system: Good air movement and clear to auscultation. Cardiovascular system: S1 & S2 heard, RRR. No JVD.  Gastrointestinal system: Abdomen is nondistended, soft and nontender.  Extremities: No pedal edema. Skin: No rashes, lesions or ulcers Psychiatry: Judgement and insight appear normal. Mood & affect appropriate.    Data Reviewed:    Labs: Basic Metabolic Panel: Recent Labs  Lab 05/10/20 0353 05/11/20 0406 05/12/20 0142  NA 134* 134* 135  K 3.7 3.6 3.3*  CL 98 100 102  CO2 21* 22 22  GLUCOSE 87 116* 84  BUN 79* 80* 82*  CREATININE 4.13* 3.95* 3.53*  CALCIUM 8.7* 8.3* 8.1*   GFR Estimated Creatinine Clearance: 15.3 mL/min (A) (by C-G formula based on SCr of 3.53 mg/dL (H)). Liver Function Tests: No results for input(s): AST, ALT, ALKPHOS, BILITOT, PROT, ALBUMIN in the last 168 hours. No results for input(s):  LIPASE, AMYLASE in the last 168 hours. No results for input(s): AMMONIA in the last 168 hours. Coagulation profile No results for input(s): INR, PROTIME in the last 168 hours. COVID-19 Labs  No results for input(s): DDIMER, FERRITIN, LDH, CRP in the last 72 hours.  Lab Results  Component Value Date   [place] NEGATIVE 05/10/2020    CBC: Recent Labs  Lab 05/10/20 0353 05/11/20 0406 05/12/20 0142  WBC 11.2* 10.1 8.5  NEUTROABS 9.7*  --   --   HGB 9.5* 8.5* 8.2*  HCT 29.3* 26.0* 25.3*  MCV 91.3 91.9 92.0  PLT 226 246 258   Cardiac Enzymes: No results for input(s): CKTOTAL, CKMB, CKMBINDEX, TROPONINI in the last 168 hours. BNP (last 3 results) No results for input(s): PROBNP in the last 8760 hours. CBG: Recent Labs  Lab 05/11/20 1129 05/11/20 1629 05/11/20 2030 05/12/20 0636 05/12/20 1128  GLUCAP 175* 137* 129* 83 88   D-Dimer: No results for input(s): DDIMER  in the last 72 hours. Hgb A1c: No results for input(s): HGBA1C in the last 72 hours. Lipid Profile: No results for input(s): CHOL, HDL, LDLCALC, TRIG, CHOLHDL, LDLDIRECT in the last 72 hours. Thyroid function studies: No results for input(s): TSH, T4TOTAL, T3FREE, THYROIDAB in the last 72 hours.  Invalid input(s): FREET3 Anemia work up: No results for input(s): VITAMINB12, FOLATE, FERRITIN, TIBC, IRON, RETICCTPCT in the last 72 hours. Sepsis Labs: Recent Labs  Lab 05/10/20 0353 05/11/20 0406 05/12/20 0142  WBC 11.2* 10.1 8.5   Microbiology Recent Results (from the past 240 hour(s))  Resp Panel by RT-PCR (Flu A&B, Covid) Nasopharyngeal Swab     Status: None   Collection Time: 05/10/20  7:25 AM   Specimen: Nasopharyngeal Swab; Nasopharyngeal(NP) swabs in vial transport medium  Result Value Ref Range Status   SARS Coronavirus 2 by RT PCR NEGATIVE NEGATIVE Final    Comment: (NOTE) SARS-CoV-2 target nucleic acids are NOT DETECTED.  The SARS-CoV-2 RNA is generally detectable in upper respiratory specimens during the acute phase of infection. The lowest concentration of SARS-CoV-2 viral copies this assay can detect is 138 copies/mL. A negative result does not preclude SARS-Cov-2 infection and should not be used as the sole basis for treatment or other patient management decisions. A negative result may occur with  improper specimen collection/handling, submission of specimen other than nasopharyngeal swab, presence of viral mutation(s) within the areas targeted by this assay, and inadequate number of viral copies(<138 copies/mL). A negative result must be combined with clinical observations, patient history, and epidemiological information. The expected result is Negative.  Fact Sheet for Patients:  EntrepreneurPulse.com.au  Fact Sheet for Healthcare Providers:  IncredibleEmployment.be  This test is no t yet approved or cleared by the [place] FDA and  has been authorized for detection and/or diagnosis of SARS-CoV-2 by FDA under an Emergency Use Authorization (EUA). This EUA will remain  in effect (meaning this test can be used) for the duration of the COVID-19 declaration under Section 564(b)(1) of the Act, 21 U.S.C.section 360bbb-3(b)(1), unless the authorization is terminated  or revoked sooner.       Influenza A by PCR NEGATIVE NEGATIVE Final   Influenza B by PCR NEGATIVE NEGATIVE Final    Comment: (NOTE) The Xpert Xpress SARS-CoV-2/FLU/RSV plus assay is intended as an aid in the diagnosis of influenza from Nasopharyngeal swab specimens and should not be used as a sole basis for treatment. Nasal washings and aspirates are unacceptable for Xpert Xpress SARS-CoV-2/FLU/RSV testing.  Fact Sheet  for Patients: EntrepreneurPulse.com.au  Fact Sheet for Healthcare Providers: IncredibleEmployment.be  This test is not yet approved or cleared by the [place] FDA and has been authorized for detection and/or diagnosis of SARS-CoV-2 by FDA under an Emergency Use Authorization (EUA). This EUA will remain in effect (meaning this test can be used) for the duration of the COVID-19 declaration under Section 564(b)(1) of the Act, 21 U.S.C. section 360bbb-3(b)(1), unless the authorization is terminated or revoked.  Performed at [facility], [place] [address], [place], [place] [address]      Medications:   . (feeding supplement) PROSource Plus  30 mL Oral BID BM  . aspirin EC  81 mg Oral QPM  . brimonidine  1 drop Both Eyes BID   And  . timolol  1 drop Both Eyes BID  . docusate sodium  100 mg Oral BID  . enoxaparin (LOVENOX) injection  30 mg Subcutaneous Q24H  . furosemide  80 mg Intravenous Once  . hydrALAZINE  10 mg Oral TID  . insulin aspart  0-9 Units Subcutaneous TID WC  . insulin glargine  5 Units Subcutaneous BID  . latanoprost  1 drop Both Eyes QHS  . multivitamin  with minerals  1 tablet Oral Daily  . potassium chloride  20 mEq Oral BID  . pravastatin  40 mg Oral QHS  . pregabalin  25 mg Oral QHS  . tamsulosin  0.4 mg Oral Daily   Continuous Infusions: . cefTRIAXone (ROCEPHIN)  IV    . lactated ringers 10 mL/hr at 05/12/20 1003      LOS: 2 days   [facility]  05/12/2020, 12:10 PM

## 2020-05-12 NOTE — Progress Notes (Signed)
This chaplain is present for notarizing of the Pt. HCPOA with the notary and two witnesses..  The Pt. wife-Justin Holloway is at the bedside.  The Pt. named Justin Holloway as his healthcare agent.  If the healthcare agent is unwilling or unable to serve as the healthcare agent, the Pt. chose [facility] as his next choice.  The chaplain gave the Pt. the original HCPOA and two copies.  A copy was scanned into the Pt. EMR.  This chaplain is available for F/U spiritual care as needed.

## 2020-05-13 DIAGNOSIS — N184 Chronic kidney disease, stage 4 (severe): Secondary | ICD-10-CM | POA: Diagnosis not present

## 2020-05-13 DIAGNOSIS — R627 Adult failure to thrive: Secondary | ICD-10-CM | POA: Diagnosis not present

## 2020-05-13 DIAGNOSIS — N179 Acute kidney failure, unspecified: Secondary | ICD-10-CM | POA: Diagnosis not present

## 2020-05-13 LAB — BASIC METABOLIC PANEL
Anion gap: 11 (ref 5–15)
BUN: 76 mg/dL — ABNORMAL HIGH (ref 8–23)
CO2: 23 mmol/L (ref 22–32)
Calcium: 8.6 mg/dL — ABNORMAL LOW (ref 8.9–10.3)
Chloride: 103 mmol/L (ref 98–111)
Creatinine, Ser: 3.1 mg/dL — ABNORMAL HIGH (ref 0.61–1.24)
GFR, Estimated: 19 mL/min — ABNORMAL LOW (ref >60)
Glucose, Bld: 122 mg/dL — ABNORMAL HIGH (ref 70–99)
Potassium: 3.6 mmol/L (ref 3.5–5.1)
Sodium: 137 mmol/L (ref 135–145)

## 2020-05-13 LAB — GLUCOSE, CAPILLARY
Glucose-Capillary: 133 mg/dL — ABNORMAL HIGH (ref 70–99)
Glucose-Capillary: 170 mg/dL — ABNORMAL HIGH (ref 70–99)
Glucose-Capillary: 113 mg/dL — ABNORMAL HIGH (ref 70–99)
Glucose-Capillary: 131 mg/dL — ABNORMAL HIGH (ref 70–99)

## 2020-05-13 MED ORDER — FUROSEMIDE 80 MG PO TABS
80.0000 mg | ORAL_TABLET | Freq: Every day | ORAL | Status: DC
  Administered 2020-05-14 – 2020-05-18 (×5): 80 mg via ORAL
  Filled 2020-05-13 (×2): qty 1
  Filled 2020-05-13: qty 4
  Filled 2020-05-13 (×2): qty 1

## 2020-05-13 MED ORDER — FUROSEMIDE 40 MG PO TABS: 40.0000 mg | ORAL_TABLET | ORAL | Status: DC

## 2020-05-13 MED ORDER — POTASSIUM CHLORIDE CRYS ER 20 MEQ PO TBCR
20.0000 meq | EXTENDED_RELEASE_TABLET | Freq: Two times a day (BID) | ORAL | Status: AC
  Administered 2020-05-13: 20 meq via ORAL
  Filled 2020-05-13: qty 1

## 2020-05-13 MED ORDER — FUROSEMIDE 40 MG PO TABS
40.0000 mg | ORAL_TABLET | Freq: Every day | ORAL | Status: DC
  Administered 2020-05-13 – 2020-05-17 (×5): 40 mg via ORAL
  Filled 2020-05-13 (×5): qty 1

## 2020-05-13 NOTE — NC FL2 (Signed)
[place] LEVEL OF CARE SCREENING TOOL     IDENTIFICATION  Patient Name: Justin Holloway Birthdate: 1929-07-24 Sex: male Admission Date (Current Location): 05/10/2020  [facility] and [place] Number:  Herbalist and Address:  [facility] [place], [place] [address], [place], [place] [address]      Provider Number: [id number]  Attending Physician Name and Address:  Charlynne Cousins, MD  Relative Name and Phone Number:  Abdulrahman Bracey - wife; [phone] (cell), [phone] (home)    Current Level of Care: SNF Recommended Level of Care: [place] Prior Approval Number:    Date Approved/Denied:   PASRR Number: [id number]  Discharge Plan: SNF    Current Diagnoses: Patient Active Problem List   Diagnosis Date Noted  . Generalized weakness 05/10/2020  . Failure to thrive in adult 05/10/2020  . Chronic pain 05/10/2020  . Constipation 05/10/2020  . 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 DNR (do not resuscitate) 05/10/2020  . Hypertension   . Hyperlipidemia   . Glaucoma   . CKD (chronic kidney disease), stage IV ([place])   . Secondary renal hyperparathyroidism ([place]) 01/14/2020  . Type 2 diabetes mellitus with stage 4 chronic kidney disease, with long-term current use of insulin ([place]) 09/10/2019  . Peripheral vascular disease, unspecified ([place]) 05/28/2019  . Diabetic neuropathy ([place]) 02/18/2019  . Left upper quadrant pain 09/04/2018  . Acute midline low back pain without sciatica 09/04/2018  . CKD (chronic kidney disease) stage 4, GFR 15-29 ml/min (HCC) 11/21/2017  . Hypertensive nephropathy 11/21/2017  . Right foot pain 11/21/2017  . Vertigo 09/09/2017  . BPPV (benign paroxysmal positional vertigo), right 09/06/2017  . Sensorineural hearing loss (SNHL), bilateral 09/06/2017  . Ataxia 02/06/2017  . UTI (lower urinary tract infection) 08/07/2015  . Dizziness  08/07/2015  . Acute renal failure superimposed on stage 3 chronic kidney disease ([place]) 08/07/2015  . Hyperkalemia 08/07/2015  . Left inguinal hernia 05/15/2013  . Diabetes mellitus without complication ([place]) 54/27/0623  . HLD (hyperlipidemia) 03/08/2006  . HYPERTENSION, BENIGN SYSTEMIC 03/08/2006  . 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 PROTEINURIA 03/08/2006    Orientation RESPIRATION BLADDER Height & Weight     Self,Time,Situation,Place  Normal Incontinent,External catheter (Existing urinary catheter) Weight: 224 lb 10.4 oz (101.9 kg) Height:     BEHAVIORAL SYMPTOMS/MOOD NEUROLOGICAL BOWEL NUTRITION STATUS      Continent Diet (Renal/carb modified with 1800 mL fluid restriction)  AMBULATORY STATUS COMMUNICATION OF NEEDS Skin   Total Care (Patient was unable to ambulate with PT during eval on 5/2) Verbally Normal                       Personal Care Assistance Level of Assistance  Bathing,Feeding,Dressing Bathing Assistance: Maximum assistance (mod assist upper body and max assist lower body) Feeding assistance: Limited assistance (Supervision) Dressing Assistance: Maximum assistance (mod assist upper body and max assist lower body)     Functional Limitations Info  Sight,Hearing,Speech Sight Info: Impaired (Glaucoma, blurred vision) Hearing Info: Adequate Speech Info: Adequate    SPECIAL CARE FACTORS FREQUENCY  PT (By licensed PT),OT (By licensed OT)     PT Frequency: Evaluated 5/2. PT at SNF eval and treat, a minimum of 5 days per week OT Frequency: Evaluated 5/3. OT at SNF eval and treat, a minimum of 5 days per week            Contractures  Contractures Info: Not present    Additional Factors Info  Code Status,Allergies,Insulin Sliding Scale Code Status Info: DNR Allergies Info: Gabapentin   Insulin Sliding Scale Info: 0-9 Units three times per day with meals       Current Medications (05/13/2020):  This is the current hospital active medication list Current  Facility-Administered Medications  Medication Dose Route Frequency Provider Last Rate Last Admin  . (feeding supplement) PROSource Plus liquid 30 mL  30 mL Oral BID BM Karmen Bongo, MD   30 mL at 05/13/20 1056  . acetaminophen (TYLENOL) tablet 650 mg  650 mg Oral Q6H PRN Karmen Bongo, MD   650 mg at 05/13/20 1055   Or  . acetaminophen (TYLENOL) suppository 650 mg  650 mg Rectal Q6H PRN Karmen Bongo, MD      . aspirin EC tablet 81 mg  81 mg Oral QPM Karmen Bongo, MD   81 mg at 05/12/20 1740  . bisacodyl (DULCOLAX) EC tablet 5 mg  5 mg Oral Daily PRN Karmen Bongo, MD      . brimonidine (ALPHAGAN) 0.15 % ophthalmic solution 1 drop  1 drop Both Eyes BID Karmen Bongo, MD   1 drop at 05/12/20 2219   And  . timolol (TIMOPTIC) 0.5 % ophthalmic solution 1 drop  1 drop Both Eyes BID Karmen Bongo, MD   1 drop at 05/12/20 2217  . calcium carbonate (dosed in mg elemental calcium) suspension 500 mg of elemental calcium  500 mg of elemental calcium Oral Q6H PRN Karmen Bongo, MD      . camphor-menthol [facility]) lotion 1 application  1 application Topical H6W PRN Karmen Bongo, MD       And  . hydrOXYzine (ATARAX/VISTARIL) tablet 25 mg  25 mg Oral Q8H PRN Karmen Bongo, MD      . cefTRIAXone (ROCEPHIN) 1 g in sodium chloride 0.9 % 100 mL IVPB  1 g Intravenous Q24H Charlynne Cousins, MD 200 mL/hr at 05/12/20 2210 1 g at 05/12/20 2210  . docusate sodium (COLACE) capsule 100 mg  100 mg Oral BID Karmen Bongo, MD   100 mg at 05/13/20 1056  . docusate sodium (ENEMEEZ) enema 283 mg  1 enema Rectal PRN Karmen Bongo, MD      . enoxaparin (LOVENOX) injection 30 mg  30 mg Subcutaneous Q24H Karmen Bongo, MD   30 mg at 05/12/20 1003  . feeding supplement (NEPRO CARB STEADY) liquid 237 mL  237 mL Oral TID PRN Karmen Bongo, MD      . Derrill Memo ON 05/14/2020] furosemide (LASIX) tablet 80 mg  80 mg Oral QAC breakfast Charlynne Cousins, MD       And  . furosemide (LASIX) tablet 40 mg  40 mg  Oral q1800 Charlynne Cousins, MD      . hydrALAZINE (APRESOLINE) injection 5 mg  5 mg Intravenous Q4H PRN Karmen Bongo, MD      . hydrALAZINE (APRESOLINE) tablet 10 mg  10 mg Oral TID Karmen Bongo, MD   10 mg at 05/11/20 1803  . insulin aspart (novoLOG) injection 0-9 Units  0-9 Units Subcutaneous TID WC Karmen Bongo, MD   2 Units at 05/12/20 1740  . insulin glargine (LANTUS) injection 5 Units  5 Units Subcutaneous BID Charlynne Cousins, MD   5 Units at 05/12/20 2214  . latanoprost (XALATAN) 0.005 % ophthalmic solution 1 drop  1 drop Both Eyes QHS Karmen Bongo, MD   1 drop at 05/12/20 2220  . multivitamin with  minerals tablet 1 tablet  1 tablet Oral Daily Karmen Bongo, MD   1 tablet at 05/13/20 1057  . ondansetron (ZOFRAN) tablet 4 mg  4 mg Oral Q6H PRN Karmen Bongo, MD       Or  . ondansetron [facility]) injection 4 mg  4 mg Intravenous Q6H PRN Karmen Bongo, MD      . potassium chloride SA (KLOR-CON) CR tablet 20 mEq  20 mEq Oral BID Charlynne Cousins, MD      . pravastatin (PRAVACHOL) tablet 40 mg  40 mg Oral Ivery Quale, MD   40 mg at 05/12/20 2214  . pregabalin (LYRICA) capsule 25 mg  25 mg Oral Ivery Quale, MD   25 mg at 05/12/20 2213  . sorbitol 70 % solution 30 mL  30 mL Oral PRN Karmen Bongo, MD   30 mL at 05/10/20 1034  . tamsulosin (FLOMAX) capsule 0.4 mg  0.4 mg Oral Daily Karmen Bongo, MD   0.4 mg at 05/13/20 1056  . traMADol (ULTRAM) tablet 50 mg  50 mg Oral Q12H PRN Karmen Bongo, MD   50 mg at 05/13/20 0428  . zolpidem (AMBIEN) tablet 5 mg  5 mg Oral QHS PRN Karmen Bongo, MD         Discharge Medications: Please see discharge summary for a list of discharge medications.  Relevant Imaging Results:  Relevant Lab Results:   Additional Information [phone].  COVID vaccinations: 02/01/19, 03/06/19, 11/08/19  Sable Feil, LCSW

## 2020-05-13 NOTE — Progress Notes (Signed)
Physical Therapy Treatment Patient Details Name: Justin Holloway MRN: [id number] DOB: May 28, 1929 Today's Date: 05/13/2020    History of Present Illness Pt is an 87 y/o male admitted 05/10/20 after fall at home left leg "gave out on him" (imaging clear). Also fell last week which resulted in Mildly impacted surgical neck fracture of the R humerus (follow up with [facility] Ortho). PMH includes Arthritis, CKD, Diabetes mellitus without complication, Glaucoma, Hyperlipidemia, HTN, and TIA.    PT Comments    Pt finishing up breakfast in bed agreeable to working with therapy. Focus of session SPC vs quad cane usage. Pt sling slid up back and arm not in it, not able to reposition sling until he up in sitting. Pt able to come to sitting EoB with min guard. Once EoB, sling repositioned. Pt is min A for coming to standing using quad cane. Pt started ambulation with strong gait using quad cane and min A however pt reports onset of neuropathy as step length shortens and increased need for use of quad cane and modA. Pt needs constant multimodal cuing for use of quad cane. D/c plans remain appropriate at this time. PT will continue to follow acutely.    Follow Up Recommendations  SNF     Equipment Recommendations  3in1 (PT);Wheelchair (measurements PT);Wheelchair cushion (measurements PT)       Precautions / Restrictions Precautions Precautions: Fall Required Braces or Orthoses: Sling Restrictions Weight Bearing Restrictions: Yes RUE Weight Bearing: Non weight bearing    Mobility  Bed Mobility Overal bed mobility: Needs Assistance Bed Mobility: Supine to Sit     Supine to sit: HOB elevated;Min guard     General bed mobility comments: pt able to navigate LE to EoB requires increased time and effort to come to EoB    Transfers Overall transfer level: Needs assistance Equipment used: Quad cane Transfers: Sit to/from Stand Sit to Stand: Min assist         General transfer comment: good power  up, min A required for steadying in standing  Ambulation/Gait Ambulation/Gait assistance: Mod assist;Min assist Gait Distance (Feet): 20 Feet Assistive device: Quad cane Gait Pattern/deviations: Step-to pattern;Decreased step length - right;Decreased step length - left;Trunk flexed;Shuffle Gait velocity: slowed Gait velocity interpretation: <1.31 ft/sec, indicative of household ambulator General Gait Details: min A progressing to modA with distance due to neuropathy and muscle weakness, constant cuing for quad cane placement and weight shift to use cane          Balance Overall balance assessment: Needs assistance Sitting-balance support: Feet supported Sitting balance-Leahy Scale: Fair Sitting balance - Comments: With challenge (i.e. LAQ), pt with posterior lean. Able to correct with verbal cueing   Standing balance support: No upper extremity supported;During functional activity Standing balance-Leahy Scale: Poor Standing balance comment: Bracing posteriorly against bed for external support, and then requiring outside support from therapist                            Cognition Arousal/Alertness: Awake/alert Behavior During Therapy: WFL for tasks assessed/performed Overall Cognitive Status: Impaired/Different from baseline Area of Impairment: Safety/judgement                         Safety/Judgement: Decreased awareness of deficits     General Comments: pt agreeable to therapy today, safety awareness improving able to recall weightbearing status, continues to need multimodal cuing for DME usage  General Comments General comments (skin integrity, edema, etc.): VSS on RA      Pertinent Vitals/Pain Pain Assessment: 0-10 Faces Pain Scale: Hurts little more Pain Location: R shoulder with movement Pain Descriptors / Indicators: Aching;Guarding Pain Intervention(s): Limited activity within patient's tolerance;Monitored during  session;Repositioned           PT Goals (current goals can now be found in the care plan section) Acute Rehab PT Goals Patient Stated Goal: pt family agreeable to rehab PT Goal Formulation: With patient/family Time For Goal Achievement: 05/24/20 Potential to Achieve Goals: Good Progress towards PT goals: Progressing toward goals    Frequency    Min 3X/week      PT Plan Current plan remains appropriate       AM-PAC PT "6 Clicks" Mobility   Outcome Measure  Help needed turning from your back to your side while in a flat bed without using bedrails?: A Little Help needed moving from lying on your back to sitting on the side of a flat bed without using bedrails?: A Little Help needed moving to and from a bed to a chair (including a wheelchair)?: A Lot Help needed standing up from a chair using your arms (e.g., wheelchair or bedside chair)?: A Lot Help needed to walk in hospital room?: A Lot Help needed climbing 3-5 steps with a railing? : Total 6 Click Score: 13    End of Session Equipment Utilized During Treatment: Gait belt Activity Tolerance: Patient limited by fatigue;Other (comment) (peripheral neuropathy in feet) Patient left: with call bell/phone within reach;with family/visitor present;in chair;with chair alarm set Nurse Communication: Mobility status PT Visit Diagnosis: Unsteadiness on feet (R26.81);Muscle weakness (generalized) (M62.81);Difficulty in walking, not elsewhere classified (R26.2);History of falling (Z91.81)     Time: 6979-4801 PT Time Calculation (min) (ACUTE ONLY): 24 min  Charges:  $Gait Training: 8-22 mins $Therapeutic Activity: 8-22 mins                     Jeffie Widdowson B. Migdalia Dk PT, DPT Acute Rehabilitation Services Pager [phone] Office [phone]    [place] 05/13/2020, 11:03 AM

## 2020-05-13 NOTE — Progress Notes (Signed)
[facility] PROGRESS NOTE    Progress Note  Justin Holloway  OQH:[id number] DOB: 10/30/1929 DOA: 05/10/2020 PCP: [name], MD     Brief Narrative:   Justin Holloway is an 87 y.o. male past medical history chronic kidney disease stage IV, diabetes mellitus type 2, essential hypertension, TIA usually uses a walker to ambulate felt and sustained a fracture in the shoulder and humerus was seen in the ED and referred for orthopedic surgery as an outpatient with Dr. Marcelino Scot, he recommended to wear a sling and no operative management.  CT to rule out pathologic fracture.  Since then she has been having difficulty ambulating fell at home on 05/10/2020 labs noted for worsening acute kidney injury x-ray of the left knee noted advanced degenerative changes.   Assessment/Plan:   Generalized weakness/failure to thrive in adult: Conservative management for recent humerus fracture. Physical therapy evaluated the patient who recommended skilled nursing facility placement. CT of the humerus showed a 2 part fracture of the right proximal humerus nondisplaced no signs of pathologic fracture. Patient is medically stable for discharge to skilled nursing facility.  Acute kidney injury on chronic kidney disease stage IV: With a baseline creatinine around 3 on admission 4.2. Restarted back on his IV Lasix and his creatinine seems to be improving slowly. Basic metabolic panels pending this morning, his potassium yesterday was 3.3 replete orally recheck today.  Diabetes mellitus type 2 with hyperglycemia: Continue current regimen blood glucose fairly controlled.  Chronic pain: Continue Ultram and Lyrica.  Constipation: Continue laxative   DVT prophylaxis: lovenox Family Communication: Son and wife Status is: Inpatient  Remains inpatient appropriate because:Hemodynamically unstable   Dispo: The patient is from: Home              Anticipated d/c is to: SNF              Patient currently  is not medically stable to d/c.   Difficult to place patient No        Code Status:     Code Status Orders  (From admission, onward)         Start     Ordered   05/10/20 0842  Do not attempt resuscitation (DNR)  Continuous       Question Answer Comment  In the event of cardiac or respiratory ARREST Do not call a "code blue"   In the event of cardiac or respiratory ARREST Do not perform Intubation, CPR, defibrillation or ACLS   In the event of cardiac or respiratory ARREST Use medication by any route, position, wound care, and other measures to relive pain and suffering. May use oxygen, suction and manual treatment of airway obstruction as needed for comfort.      05/10/20 0844        Code Status History    Date Active Date Inactive Code Status Order ID Comments User Context   09/09/2017 1431 09/10/2017 1721 Full Code [id number]  Cristal Deer, MD Inpatient   08/07/2015 2006 08/09/2015 1748 Full Code [id number]  Ivor Costa, MD ED   Advance Care Planning Activity        IV Access:    Peripheral IV   Procedures and diagnostic studies:   CT HUMERUS RIGHT WO CONTRAST  Result Date: 05/11/2020 CLINICAL DATA:  Humeral fracture EXAM: CT OF THE RIGHT HUMERUS WITHOUT CONTRAST TECHNIQUE: Multidetector CT imaging was performed according to the standard protocol. Multiplanar CT image reconstructions were also generated. COMPARISON:  05/04/2020 radiograph FINDINGS: Bones/Joint/Cartilage Impacted  fracture of the surgical neck of the humerus with apex anterior angulation and 2.2 cm of displacement. Mild comminution. A fracture plane extends through the greater tuberosity but is not displaced and hence does not count as a separate fragment in the Neer classification. Accordingly this is a Neer 2 part fracture. There is a 0.6 by 0.6 cm cortical fragment imbedded along the fracture plane on image 29 of series 6. Glenohumeral spurring.  No other humeral fracture is identified. Ligaments  Suboptimally assessed by CT. Muscles and Tendons Indistinctness of tissue planes along the fracture site particularly along the adjacent subscapularis muscle. Soft tissues Linear subsegmental atelectasis or scarring in the left lower lobe. IMPRESSION: 1. Neer 2 part fracture of the right proximal humerus as described above. The fracture plane extending through the greater tuberosity is relatively nondisplaced thus does not count as a separate "part" under the Neer classification. 2. Degenerative glenohumeral spurring. Electronically Signed   By: Van Clines M.D.   On: 05/11/2020 14:25   US RENAL  Result Date: 05/11/2020 CLINICAL DATA:  Acute renal failure. EXAM: RENAL / URINARY TRACT ULTRASOUND COMPLETE COMPARISON:  CT 10/21/2018. FINDINGS: Right Kidney: Renal measurements: 8.5 x 4.7 x 4.3 cm = volume: 88.7 mL. Cortical thinning. Mild increased echogenicity. No mass or hydronephrosis visualized. Left Kidney: Renal measurements: 9.7 x 5.5 x 4.7 cm = volume: 129.2 mL. Difficult visualized. Mild increased echogenicity. No mass or hydronephrosis visualized. Bladder: Appears normal for degree of bladder distention. Other: None. IMPRESSION: 1. Mild increased echogenicity both kidneys suggesting chronic medical renal disease. Right renal cortical thinning. 2.  No acute abnormality.  No hydronephrosis or bladder distention. Electronically Signed   By: Marcello Moores  Register   On: 05/11/2020 16:28     Medical Consultants:    None.   Subjective:    Justin Holloway no new complaints feels great.  Objective:    Vitals:   05/12/20 0910 05/12/20 1618 05/12/20 2204 05/13/20 0431  BP: (!) 110/56 117/60 (!) 99/51 116/61  Pulse: 76 87  80  Resp: 18 18 16 15   Temp: 98 F (36.7 C) 98 F (36.7 C) 98.4 F (36.9 C) 98 F (36.7 C)  TempSrc: Oral Oral Oral Oral  SpO2: 98% 97% 100% 99%  Weight:    101.9 kg   SpO2: 99 %   Intake/Output Summary (Last 24 hours) at 05/13/2020 1019 Last data filed at  05/13/2020 0400 Gross per 24 hour  Intake 620 ml  Output 1825 ml  Net -1205 ml   Filed Weights   05/13/20 0431  Weight: 101.9 kg    Exam: General exam: In no acute distress. Respiratory system: Good air movement and clear to auscultation. Cardiovascular system: S1 & S2 heard, RRR. No JVD. Gastrointestinal system: Abdomen is nondistended, soft and nontender.  Extremities: No pedal edema. Skin: No rashes, lesions or ulcers Psychiatry: Judgement and insight appear normal. Mood & affect appropriate.   Data Reviewed:    Labs: Basic Metabolic Panel: Recent Labs  Lab 05/10/20 0353 05/11/20 0406 05/12/20 0142  NA 134* 134* 135  K 3.7 3.6 3.3*  CL 98 100 102  CO2 21* 22 22  GLUCOSE 87 116* 84  BUN 79* 80* 82*  CREATININE 4.13* 3.95* 3.53*  CALCIUM 8.7* 8.3* 8.1*   GFR Estimated Creatinine Clearance: 15.9 mL/min (A) (by C-G formula based on SCr of 3.53 mg/dL (H)). Liver Function Tests: No results for input(s): AST, ALT, ALKPHOS, BILITOT, PROT, ALBUMIN in the last 168 hours. No results for  input(s): LIPASE, AMYLASE in the last 168 hours. No results for input(s): AMMONIA in the last 168 hours. Coagulation profile No results for input(s): INR, PROTIME in the last 168 hours. COVID-19 Labs  No results for input(s): DDIMER, FERRITIN, LDH, CRP in the last 72 hours.  Lab Results  Component Value Date   [place] NEGATIVE 05/10/2020    CBC: Recent Labs  Lab 05/10/20 0353 05/11/20 0406 05/12/20 0142  WBC 11.2* 10.1 8.5  NEUTROABS 9.7*  --   --   HGB 9.5* 8.5* 8.2*  HCT 29.3* 26.0* 25.3*  MCV 91.3 91.9 92.0  PLT 226 246 258   Cardiac Enzymes: No results for input(s): CKTOTAL, CKMB, CKMBINDEX, TROPONINI in the last 168 hours. BNP (last 3 results) No results for input(s): PROBNP in the last 8760 hours. CBG: Recent Labs  Lab 05/12/20 0636 05/12/20 1128 05/12/20 1718 05/12/20 2207 05/13/20 0643  GLUCAP 83 88 156* 195* 133*   D-Dimer: No results for  input(s): DDIMER in the last 72 hours. Hgb A1c: No results for input(s): HGBA1C in the last 72 hours. Lipid Profile: No results for input(s): CHOL, HDL, LDLCALC, TRIG, CHOLHDL, LDLDIRECT in the last 72 hours. Thyroid function studies: No results for input(s): TSH, T4TOTAL, T3FREE, THYROIDAB in the last 72 hours.  Invalid input(s): FREET3 Anemia work up: No results for input(s): VITAMINB12, FOLATE, FERRITIN, TIBC, IRON, RETICCTPCT in the last 72 hours. Sepsis Labs: Recent Labs  Lab 05/10/20 0353 05/11/20 0406 05/12/20 0142  WBC 11.2* 10.1 8.5   Microbiology Recent Results (from the past 240 hour(s))  Resp Panel by RT-PCR (Flu A&B, Covid) Nasopharyngeal Swab     Status: None   Collection Time: 05/10/20  7:25 AM   Specimen: Nasopharyngeal Swab; Nasopharyngeal(NP) swabs in vial transport medium  Result Value Ref Range Status   SARS Coronavirus 2 by RT PCR NEGATIVE NEGATIVE Final    Comment: (NOTE) SARS-CoV-2 target nucleic acids are NOT DETECTED.  The SARS-CoV-2 RNA is generally detectable in upper respiratory specimens during the acute phase of infection. The lowest concentration of SARS-CoV-2 viral copies this assay can detect is 138 copies/mL. A negative result does not preclude SARS-Cov-2 infection and should not be used as the sole basis for treatment or other patient management decisions. A negative result may occur with  improper specimen collection/handling, submission of specimen other than nasopharyngeal swab, presence of viral mutation(s) within the areas targeted by this assay, and inadequate number of viral copies(<138 copies/mL). A negative result must be combined with clinical observations, patient history, and epidemiological information. The expected result is Negative.  Fact Sheet for Patients:  EntrepreneurPulse.com.au  Fact Sheet for Healthcare Providers:  IncredibleEmployment.be  This test is no t yet approved or  cleared by the [place] FDA and  has been authorized for detection and/or diagnosis of SARS-CoV-2 by FDA under an Emergency Use Authorization (EUA). This EUA will remain  in effect (meaning this test can be used) for the duration of the COVID-19 declaration under Section 564(b)(1) of the Act, 21 U.S.C.section 360bbb-3(b)(1), unless the authorization is terminated  or revoked sooner.       Influenza A by PCR NEGATIVE NEGATIVE Final   Influenza B by PCR NEGATIVE NEGATIVE Final    Comment: (NOTE) The Xpert Xpress SARS-CoV-2/FLU/RSV plus assay is intended as an aid in the diagnosis of influenza from Nasopharyngeal swab specimens and should not be used as a sole basis for treatment. Nasal washings and aspirates are unacceptable for Xpert Xpress SARS-CoV-2/FLU/RSV testing.  Fact  Sheet for Patients: EntrepreneurPulse.com.au  Fact Sheet for Healthcare Providers: IncredibleEmployment.be  This test is not yet approved or cleared by the [place] FDA and has been authorized for detection and/or diagnosis of SARS-CoV-2 by FDA under an Emergency Use Authorization (EUA). This EUA will remain in effect (meaning this test can be used) for the duration of the COVID-19 declaration under Section 564(b)(1) of the Act, 21 U.S.C. section 360bbb-3(b)(1), unless the authorization is terminated or revoked.  Performed at [facility], [place] [address], [place], [place] [address]      Medications:   . (feeding supplement) PROSource Plus  30 mL Oral BID BM  . aspirin EC  81 mg Oral QPM  . brimonidine  1 drop Both Eyes BID   And  . timolol  1 drop Both Eyes BID  . docusate sodium  100 mg Oral BID  . enoxaparin (LOVENOX) injection  30 mg Subcutaneous Q24H  . hydrALAZINE  10 mg Oral TID  . insulin aspart  0-9 Units Subcutaneous TID WC  . insulin glargine  5 Units Subcutaneous BID  . latanoprost  1 drop Both Eyes QHS  . multivitamin with minerals  1  tablet Oral Daily  . polyethylene glycol  17 g Oral Daily  . potassium chloride  20 mEq Oral BID  . pravastatin  40 mg Oral QHS  . pregabalin  25 mg Oral QHS  . tamsulosin  0.4 mg Oral Daily   Continuous Infusions: . cefTRIAXone (ROCEPHIN)  IV 1 g (05/12/20 2210)  . lactated ringers 10 mL/hr at 05/12/20 1003      LOS: 3 days   [facility]  05/13/2020, 10:19 AM

## 2020-05-13 NOTE — Plan of Care (Signed)
  Problem: Nutrition: Goal: Adequate nutrition will be maintained Outcome: Progressing   

## 2020-05-13 NOTE — Plan of Care (Signed)
  Problem: Activity: Goal: Risk for activity intolerance will decrease Outcome: Progressing   Problem: Nutrition: Goal: Adequate nutrition will be maintained Outcome: Progressing   

## 2020-05-13 NOTE — Progress Notes (Signed)
Sitting up in recliner for 5 hrs,able to bring himself back in bed with  Very little assist from the staff.

## 2020-05-14 DIAGNOSIS — N179 Acute kidney failure, unspecified: Secondary | ICD-10-CM | POA: Diagnosis not present

## 2020-05-14 DIAGNOSIS — K5904 Chronic idiopathic constipation: Secondary | ICD-10-CM | POA: Diagnosis not present

## 2020-05-14 DIAGNOSIS — N184 Chronic kidney disease, stage 4 (severe): Secondary | ICD-10-CM | POA: Diagnosis not present

## 2020-05-14 DIAGNOSIS — R627 Adult failure to thrive: Secondary | ICD-10-CM | POA: Diagnosis not present

## 2020-05-14 DIAGNOSIS — N189 Chronic kidney disease, unspecified: Secondary | ICD-10-CM

## 2020-05-14 LAB — BASIC METABOLIC PANEL
Anion gap: 9 (ref 5–15)
BUN: 76 mg/dL — ABNORMAL HIGH (ref 8–23)
CO2: 22 mmol/L (ref 22–32)
Calcium: 8.3 mg/dL — ABNORMAL LOW (ref 8.9–10.3)
Chloride: 106 mmol/L (ref 98–111)
Creatinine, Ser: 3.16 mg/dL — ABNORMAL HIGH (ref 0.61–1.24)
GFR, Estimated: 18 mL/min — ABNORMAL LOW (ref >60)
Glucose, Bld: 142 mg/dL — ABNORMAL HIGH (ref 70–99)
Potassium: 3.9 mmol/L (ref 3.5–5.1)
Sodium: 137 mmol/L (ref 135–145)

## 2020-05-14 LAB — GLUCOSE, CAPILLARY
Glucose-Capillary: 135 mg/dL — ABNORMAL HIGH (ref 70–99)
Glucose-Capillary: 95 mg/dL (ref 70–99)
Glucose-Capillary: 134 mg/dL — ABNORMAL HIGH (ref 70–99)
Glucose-Capillary: 128 mg/dL — ABNORMAL HIGH (ref 70–99)

## 2020-05-14 MED ORDER — TRAMADOL HCL 50 MG PO TABS: 50.0000 mg | ORAL_TABLET | Freq: Four times a day (QID) | ORAL | 0 refills | Status: DC | PRN

## 2020-05-14 MED ORDER — PREGABALIN 25 MG PO CAPS: 25.0000 mg | ORAL_CAPSULE | Freq: Every day | ORAL | 0 refills | Status: DC

## 2020-05-14 NOTE — Progress Notes (Signed)
Physical Therapy Treatment Patient Details Name: Justin Holloway MRN: [id number] DOB: 1929/11/05 Today's Date: 05/14/2020    History of Present Illness Pt is an 87 y/o male admitted 05/10/20 after fall at home left leg "gave out on him" (imaging clear). Also fell last week which resulted in Mildly impacted surgical neck fracture of the R humerus (follow up with OP Ortho). PMH includes Arthritis, CKD, Diabetes mellitus without complication, Glaucoma, Hyperlipidemia, HTN, and TIA.    PT Comments    Pt received in recliner. Edema noted R hand due to arm being out of sling and hand hanging in dependent position. Sling readjusted for proper fit. LE exercises performed in sitting. Min assist sit to stand x 3 trials. Min assist to maintain static standing balance. Pt returned to sitting in recliner at end of session.  RUE elevated on pillow. Pt educated on need to perform ROM R hand to assist with edema control.   Follow Up Recommendations  SNF     Equipment Recommendations  3in1 (PT);Wheelchair (measurements PT);Wheelchair cushion (measurements PT)    Recommendations for Other Services       Precautions / Restrictions Precautions Precautions: Fall Required Braces or Orthoses: Sling Restrictions Weight Bearing Restrictions: Yes RUE Weight Bearing: Non weight bearing    Mobility  Bed Mobility               General bed mobility comments: Pt in recliner.    Transfers Overall transfer level: Needs assistance Equipment used: Quad cane Transfers: Sit to/from Stand Sit to Stand: Min assist         General transfer comment: assist to power up and stabilize balance  Ambulation/Gait                 Stairs             Wheelchair Mobility    Modified Rankin (Stroke Patients Only)       Balance Overall balance assessment: Needs assistance Sitting-balance support: Feet supported;No upper extremity supported Sitting balance-Leahy Scale: Fair     Standing  balance support: Single extremity supported;During functional activity Standing balance-Leahy Scale: Poor Standing balance comment: reliant on external assist                            Cognition Arousal/Alertness: Awake/alert Behavior During Therapy: WFL for tasks assessed/performed Overall Cognitive Status: Impaired/Different from baseline Area of Impairment: Safety/judgement                         Safety/Judgement: Decreased awareness of deficits;Decreased awareness of safety            Exercises General Exercises - Lower Extremity Ankle Circles/Pumps: AROM;Both;10 reps;Seated Gluteal Sets: AROM;Both;10 reps;Seated Long Arc Quad: AROM;Right;Left;10 reps;Seated Hip ABduction/ADduction: AROM;Both;10 reps;Seated Hip Flexion/Marching: AROM;Right;Left;10 reps;Seated    General Comments General comments (skin integrity, edema, etc.): VSS on RA      Pertinent Vitals/Pain Pain Assessment: Faces Faces Pain Scale: Hurts little more Pain Location: R shoulder with movement Pain Descriptors / Indicators: Grimacing;Guarding;Discomfort Pain Intervention(s): Limited activity within patient's tolerance;Monitored during session;Repositioned    Home Living                      Prior Function            PT Goals (current goals can now be found in the care plan section) Acute Rehab PT Goals Patient Stated Goal: not stated  Progress towards PT goals: Progressing toward goals    Frequency    Min 3X/week      PT Plan Current plan remains appropriate    Co-evaluation              AM-PAC PT "6 Clicks" Mobility   Outcome Measure  Help needed turning from your back to your side while in a flat bed without using bedrails?: A Little Help needed moving from lying on your back to sitting on the side of a flat bed without using bedrails?: A Little Help needed moving to and from a bed to a chair (including a wheelchair)?: A Lot Help needed  standing up from a chair using your arms (e.g., wheelchair or bedside chair)?: A Little Help needed to walk in hospital room?: A Lot Help needed climbing 3-5 steps with a railing? : Total 6 Click Score: 14    End of Session Equipment Utilized During Treatment: Gait belt Activity Tolerance: Patient tolerated treatment well Patient left: in chair;with call bell/phone within reach;with chair alarm set Nurse Communication: Mobility status PT Visit Diagnosis: Unsteadiness on feet (R26.81);Muscle weakness (generalized) (M62.81);Difficulty in walking, not elsewhere classified (R26.2);History of falling (Z91.81)     Time: 8841-6606 PT Time Calculation (min) (ACUTE ONLY): 12 min  Charges:  $Therapeutic Exercise: 8-22 mins                     Lorrin Goodell, PT  Office # [phone] Pager [phone]    Lorriane Shire 05/14/2020, 11:32 AM

## 2020-05-14 NOTE — TOC Progression Note (Signed)
Transition of Care [facility]) - Progression Note    Patient Details  Name: Justin Holloway MRN: [id number] Date of Birth: 1929-12-16  Transition of Care [facility]) CM/SW Contact  Sharlet Salina Mila Homer, LCSW Phone Number: 05/14/2020, 7:04 PM  Clinical Narrative:  Talked with patient and wife at bedside regarding SNF selection. [facility] was selected, however admissions Designer, fashion/clothing indicated, after talking with her administrator that they would not be able to accept patient as they are short-staffed. Wife advised and provided with additional facilities that made bed offers since 5/5 when she was given initial 3 facilities. Mrs. Raper was given Medicare.gov list again and encouraged to go on web site to get updated rating information.   Wife and patient expressed interest in CIR and MD was contacted and asked to put in consult. This was done and they entered a note that patient does not meet medical necessity criteria for CIR and this information was shared with patient and wife.  CSW was informed later (after 5 pm)  that wife had chosen a SNF. Called wife and message left that a SW would contact her over the weekend. [employer] contacted (6:46 pm) and insurance auth initiated with [employer] representative was informed that a facility had not been chosen, and they will be contacted once the decision was provided and confirmed.  **Weekend CSW's Janett Billow) contact information provided to representative **Reference [phone] **Fax number for clinicals - [phone] **clinicals faxed to [facility]    Expected Discharge Plan: [place] Barriers to Discharge: Continued Medical Work up  Expected Discharge Plan and Services Expected Discharge Plan: [place] In-house Referral: Clinical Social Work     Living arrangements for the past 2 months: Apartment Expected Discharge Date: 05/14/20                                   Social Determinants of Health  (SDOH) Interventions  No SDOH interventions requested or needed at this time  Readmission Risk Interventions No flowsheet data found.

## 2020-05-14 NOTE — Discharge Summary (Signed)
Physician Discharge Summary  Justin Holloway NLG:[id number] DOB: Jul 13, 1929 DOA: 05/10/2020  PCP: [name], MD  Admit date: 05/10/2020 Discharge date: 05/14/2020  Admitted From: Home Disposition:  SNF  Recommendations for Outpatient Follow-up:  1. Follow up with PCP in 1-2 weeks 2. Please obtain BMP/CBC in one week   Home Health:No Equipment/Devices:None  Discharge Condition:Stable CODE STATUS:Full Diet recommendation: Heart Healthy  Brief/Interim Summary: 87 y.o. male past medical history chronic kidney disease stage IV, diabetes mellitus type 2, essential hypertension, TIA usually uses a walker to ambulate felt and sustained a fracture in the shoulder and humerus was seen in the ED and referred for orthopedic surgery as an outpatient with Dr. Marcelino Scot, he recommended to wear a sling and no operative management.  CT to rule out pathologic fracture.  Since then she has been having difficulty ambulating fell at home on 05/10/2020 labs noted for worsening acute kidney injury x-ray of the left knee noted advanced degenerative changes.  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  Acute kidney injury on chronic kidney disease stage IV: With a baseline creatinine around 3 on admission 4.2 he was started on IV fluids and his creatinine returned to baseline. Patient has refused dialysis in the past he reiterates this.  Diabetes mellitus type 2 with  hyperglycemia: Currently well controlled no change made to his medication.  Chronic pain: Continue Ultram and Lyrica.  Constipation: He was started on laxatives.   Discharge Instructions  Discharge Instructions    Diet - low sodium heart healthy   Complete by: As directed    Increase activity slowly   Complete by: As directed      Allergies as of 05/14/2020      Reactions   Gabapentin Itching      Medication List    TAKE these medications   aspirin EC 81 MG tablet Take 81 mg by mouth every evening.   cholecalciferol 1000 units tablet Commonly known as: VITAMIN D Take 2,000 Units by mouth daily.   Combigan 0.2-0.5 % ophthalmic solution Generic drug: brimonidine-timolol Place 1 drop into both eyes every 12 (twelve) hours.   diclofenac Sodium 1 % Gel Commonly known as: VOLTAREN Apply 4 g topically 4 (four) times daily. What changed:   how much to take  when to take this  additional instructions   ferrous sulfate 325 (65 FE) MG tablet Take 325 mg by mouth daily with breakfast.   furosemide 80 MG tablet Commonly known as: LASIX TAKE 1 TABLET BY MOUTH IN  THE MORNING AND ONE-HALF  TABLET BY MOUTH IN THE  EARLY EVENING What changed:   how much to take  how to take this  when to take this  additional instructions   hydrALAZINE 10 MG tablet Commonly known as: APRESOLINE Take 10 mg by mouth 3 (three) times daily.   insulin degludec 200 UNIT/ML FlexTouch Pen Commonly known as: TRESIBA Inject 23 Units into the skin at bedtime.   Lumigan 0.01 % Soln Generic  drug: bimatoprost Place 1 drop into both eyes 2 (two) times daily.   multivitamin with minerals Tabs tablet Take 1 tablet by mouth daily.   ONE TOUCH ULTRA 2 w/Device Kit Use as directed to check blood sugars 2 times per day dx: V40.98   OneTouch Delica Plus [id number] Misc 100 each by Does not apply route in the morning, at noon, and at bedtime.   OneTouch Ultra test strip Generic drug:  glucose blood USE STRIPS TO TEST BLOOD  SUGAR 3 TIMES DAILY 1 STRIP PER TEST   Ozempic (0.25 or 0.5 MG/DOSE) 2 MG/1.5ML Sopn Generic drug: Semaglutide(0.25 or 0.5MG/DOS) Inject 0.5 mg into the skin once a week. What changed: when to take this   polyethylene glycol powder 17 GM/SCOOP powder Commonly known as: GLYCOLAX/MIRALAX Take 17 g by mouth daily as needed for mild constipation.   pravastatin 40 MG tablet Commonly known as: PRAVACHOL Take 1 tablet (40 mg total) by mouth daily. What changed: when to take this   pregabalin 25 MG capsule Commonly known as: LYRICA TAKE 1 CAPSULE(25 MG) BY MOUTH AT BEDTIME AS NEEDED What changed: See the new instructions.   tamsulosin 0.4 MG Caps capsule Commonly known as: FLOMAX Take 0.4 mg by mouth daily.   traMADol 50 MG tablet Commonly known as: ULTRAM Take 50 mg by mouth every 6 (six) hours as needed for moderate pain.   vitamin C 1000 MG tablet Take 1,000 mg by mouth daily.       Follow-up Information    Rosita Fire, MD Follow up in 3 week(s).   Specialties: Nephrology, Internal Medicine Why: We will call with appt details Contact information: [address] [place]  [address] [phone]              Allergies  Allergen Reactions  . Gabapentin Itching    Consultations:  None   Procedures/Studies: DG Shoulder Right  Result Date: 05/04/2020 CLINICAL DATA:  Right shoulder pain and limited range of motion since the patient fell out of bed this morning. EXAM: RIGHT SHOULDER - 2+ VIEW COMPARISON:  None. FINDINGS: The patient has a mildly impacted surgical neck fracture of the humerus. The humeral head and neck have a mottled appearance. The acromioclavicular joint is intact with mild degenerative change present. Soft tissues are unremarkable. IMPRESSION: Mildly impacted surgical neck fracture of the humerus. Mild appearance of the humeral head could be due to osteopenia but underlying tumor is possible.  Electronically Signed   By: Inge Rise M.D.   On: 05/04/2020 15:39   CT HUMERUS RIGHT WO CONTRAST  Result Date: 05/11/2020 CLINICAL DATA:  Humeral fracture EXAM: CT OF THE RIGHT HUMERUS WITHOUT CONTRAST TECHNIQUE: Multidetector CT imaging was performed according to the standard protocol. Multiplanar CT image reconstructions were also generated. COMPARISON:  05/04/2020 radiograph FINDINGS: Bones/Joint/Cartilage Impacted fracture of the surgical neck of the humerus with apex anterior angulation and 2.2 cm of displacement. Mild comminution. A fracture plane extends through the greater tuberosity but is not displaced and hence does not count as a separate fragment in the Neer classification. Accordingly this is a Neer 2 part fracture. There is a 0.6 by 0.6 cm cortical fragment imbedded along the fracture plane on image 29 of series 6. Glenohumeral spurring.  No other humeral fracture is identified. Ligaments Suboptimally assessed by CT. Muscles and Tendons Indistinctness of tissue planes along the fracture site particularly along the adjacent subscapularis muscle. Soft tissues Linear subsegmental atelectasis or scarring in the left lower lobe. IMPRESSION:  1. Neer 2 part fracture of the right proximal humerus as described above. The fracture plane extending through the greater tuberosity is relatively nondisplaced thus does not count as a separate "part" under the Neer classification. 2. Degenerative glenohumeral spurring. Electronically Signed   By: Van Clines M.D.   On: 05/11/2020 14:25   US RENAL  Result Date: 05/11/2020 CLINICAL DATA:  Acute renal failure. EXAM: RENAL / URINARY TRACT ULTRASOUND COMPLETE COMPARISON:  CT 10/21/2018. FINDINGS: Right Kidney: Renal measurements: 8.5 x 4.7 x 4.3 cm = volume: 88.7 mL. Cortical thinning. Mild increased echogenicity. No mass or hydronephrosis visualized. Left Kidney: Renal measurements: 9.7 x 5.5 x 4.7 cm = volume: 129.2 mL. Difficult visualized. Mild  increased echogenicity. No mass or hydronephrosis visualized. Bladder: Appears normal for degree of bladder distention. Other: None. IMPRESSION: 1. Mild increased echogenicity both kidneys suggesting chronic medical renal disease. Right renal cortical thinning. 2.  No acute abnormality.  No hydronephrosis or bladder distention. Electronically Signed   By: Marcello Moores  Register   On: 05/11/2020 16:28   DG Knee Complete 4 Views Left  Result Date: 05/10/2020 CLINICAL DATA:  Left knee "gave out". EXAM: LEFT KNEE - COMPLETE 4+ VIEW COMPARISON:  None. FINDINGS: No evidence of an acute fracture or dislocation. Marked severity medial and lateral tibiofemoral compartment space narrowing is seen. Marked severity patellofemoral narrowing is also noted. There is a small to moderate sized knee effusion. IMPRESSION: 1. Marked severity degenerative changes without an acute osseous abnormality. 2. Small to moderate sized joint effusion. Electronically Signed   By: Virgina Norfolk M.D.   On: 05/10/2020 03:59      Subjective: No new complaints feels great.  Discharge Exam: Vitals:   05/14/20 0530 05/14/20 1010  BP: 113/61 106/60  Pulse: 87 88  Resp: 15 17  Temp: (!) 97.4 F (36.3 C) 98 F (36.7 C)  SpO2: 98% 96%   Vitals:   05/13/20 1816 05/13/20 2012 05/14/20 0530 05/14/20 1010  BP: (!) 113/50 120/63 113/61 106/60  Pulse: 83 78 87 88  Resp: 18 14 15 17   Temp: 97.9 F (36.6 C) (!) 97.3 F (36.3 C) (!) 97.4 F (36.3 C) 98 F (36.7 C)  TempSrc: Oral Oral Oral Oral  SpO2: 100% 100% 98% 96%  Weight:        General: Pt is alert, awake, not in acute distress Cardiovascular: RRR, S1/S2 +, no rubs, no gallops Respiratory: CTA bilaterally, no wheezing, no rhonchi Abdominal: Soft, NT, ND, bowel sounds + Extremities: no edema, no cyanosis    The results of significant diagnostics from this hospitalization (including imaging, microbiology, ancillary and laboratory) are listed below for reference.      Microbiology: Recent Results (from the past 240 hour(s))  Resp Panel by RT-PCR (Flu A&B, Covid) Nasopharyngeal Swab     Status: None   Collection Time: 05/10/20  7:25 AM   Specimen: Nasopharyngeal Swab; Nasopharyngeal(NP) swabs in vial transport medium  Result Value Ref Range Status   SARS Coronavirus 2 by RT PCR NEGATIVE NEGATIVE Final    Comment: (NOTE) SARS-CoV-2 target nucleic acids are NOT DETECTED.  The SARS-CoV-2 RNA is generally detectable in upper respiratory specimens during the acute phase of infection. The lowest concentration of SARS-CoV-2 viral copies this assay can detect is 138 copies/mL. A negative result does not preclude SARS-Cov-2 infection and should not be used as the sole basis for treatment or other patient management decisions. A negative result may occur with  improper specimen collection/handling, submission of specimen  other than nasopharyngeal swab, presence of viral mutation(s) within the areas targeted by this assay, and inadequate number of viral copies(<138 copies/mL). A negative result must be combined with clinical observations, patient history, and epidemiological information. The expected result is Negative.  Fact Sheet for Patients:  EntrepreneurPulse.com.au  Fact Sheet for Healthcare Providers:  IncredibleEmployment.be  This test is no t yet approved or cleared by the [place] FDA and  has been authorized for detection and/or diagnosis of SARS-CoV-2 by FDA under an Emergency Use Authorization (EUA). This EUA will remain  in effect (meaning this test can be used) for the duration of the COVID-19 declaration under Section 564(b)(1) of the Act, 21 U.S.C.section 360bbb-3(b)(1), unless the authorization is terminated  or revoked sooner.       Influenza A by PCR NEGATIVE NEGATIVE Final   Influenza B by PCR NEGATIVE NEGATIVE Final    Comment: (NOTE) The Xpert Xpress SARS-CoV-2/FLU/RSV plus assay is  intended as an aid in the diagnosis of influenza from Nasopharyngeal swab specimens and should not be used as a sole basis for treatment. Nasal washings and aspirates are unacceptable for Xpert Xpress SARS-CoV-2/FLU/RSV testing.  Fact Sheet for Patients: EntrepreneurPulse.com.au  Fact Sheet for Healthcare Providers: IncredibleEmployment.be  This test is not yet approved or cleared by the [place] FDA and has been authorized for detection and/or diagnosis of SARS-CoV-2 by FDA under an Emergency Use Authorization (EUA). This EUA will remain in effect (meaning this test can be used) for the duration of the COVID-19 declaration under Section 564(b)(1) of the Act, 21 U.S.C. section 360bbb-3(b)(1), unless the authorization is terminated or revoked.  Performed at [facility], [place] [address] [place], [place] [address]   Culture, Urine     Status: Abnormal (Preliminary result)   Collection Time: 05/12/20  4:04 PM   Specimen: Urine, Clean Catch  Result Value Ref Range Status   Specimen Description URINE, CLEAN CATCH  Final   Special Requests NONE  Final   Culture (A)  Final    30,000 COLONIES/mL KLEBSIELLA PNEUMONIAE REPEATING SUSCEPTIBILITY Performed at [facility], [place] [address] [place], [place] [address]    Report Status PENDING  Incomplete     Labs: BNP (last 3 results) No results for input(s): BNP in the last 8760 hours. Basic Metabolic Panel: Recent Labs  Lab 05/10/20 0353 05/11/20 0406 05/12/20 0142 05/13/20 1018 05/14/20 0548  NA 134* 134* 135 137 137  K 3.7 3.6 3.3* 3.6 3.9  CL 98 100 102 103 106  CO2 21* 22 22 23 22   GLUCOSE 87 116* 84 122* 142*  BUN 79* 80* 82* 76* 76*  CREATININE 4.13* 3.95* 3.53* 3.10* 3.16*  CALCIUM 8.7* 8.3* 8.1* 8.6* 8.3*   Liver Function Tests: No results for input(s): AST, ALT, ALKPHOS, BILITOT, PROT, ALBUMIN in the last 168 hours. No results for input(s): LIPASE, AMYLASE in  the last 168 hours. No results for input(s): AMMONIA in the last 168 hours. CBC: Recent Labs  Lab 05/10/20 0353 05/11/20 0406 05/12/20 0142  WBC 11.2* 10.1 8.5  NEUTROABS 9.7*  --   --   HGB 9.5* 8.5* 8.2*  HCT 29.3* 26.0* 25.3*  MCV 91.3 91.9 92.0  PLT 226 246 258   Cardiac Enzymes: No results for input(s): CKTOTAL, CKMB, CKMBINDEX, TROPONINI in the last 168 hours. BNP: Invalid input(s): POCBNP CBG: Recent Labs  Lab 05/13/20 1201 05/13/20 1714 05/13/20 2148 05/14/20 0618 05/14/20 1138  GLUCAP 170* 113* 131* 135* 95  D-Dimer No results for input(s): DDIMER in the last 72 hours. Hgb A1c No results for input(s): HGBA1C in the last 72 hours. Lipid Profile No results for input(s): CHOL, HDL, LDLCALC, TRIG, CHOLHDL, LDLDIRECT in the last 72 hours. Thyroid function studies No results for input(s): TSH, T4TOTAL, T3FREE, THYROIDAB in the last 72 hours.  Invalid input(s): FREET3 Anemia work up No results for input(s): VITAMINB12, FOLATE, FERRITIN, TIBC, IRON, RETICCTPCT in the last 72 hours. Urinalysis    Component Value Date/Time   COLORURINE YELLOW 05/11/2020 1754   APPEARANCEUR CLOUDY (A) 05/11/2020 1754   LABSPEC 1.014 05/11/2020 1754   PHURINE 5.0 05/11/2020 1754   GLUCOSEU NEGATIVE 05/11/2020 1754   HGBUR SMALL (A) 05/11/2020 1754   BILIRUBINUR NEGATIVE 05/11/2020 1754   BILIRUBINUR negative 09/10/2019 1647   KETONESUR NEGATIVE 05/11/2020 1754   PROTEINUR NEGATIVE 05/11/2020 1754   UROBILINOGEN 0.2 09/10/2019 1647   UROBILINOGEN 0.2 06/04/2013 1313   NITRITE NEGATIVE 05/11/2020 1754   LEUKOCYTESUR LARGE (A) 05/11/2020 1754   Sepsis Labs Invalid input(s): PROCALCITONIN,  WBC,  LACTICIDVEN Microbiology Recent Results (from the past 240 hour(s))  Resp Panel by RT-PCR (Flu A&B, Covid) Nasopharyngeal Swab     Status: None   Collection Time: 05/10/20  7:25 AM   Specimen: Nasopharyngeal Swab; Nasopharyngeal(NP) swabs in vial transport medium  Result Value Ref  Range Status   SARS Coronavirus 2 by RT PCR NEGATIVE NEGATIVE Final    Comment: (NOTE) SARS-CoV-2 target nucleic acids are NOT DETECTED.  The SARS-CoV-2 RNA is generally detectable in upper respiratory specimens during the acute phase of infection. The lowest concentration of SARS-CoV-2 viral copies this assay can detect is 138 copies/mL. A negative result does not preclude SARS-Cov-2 infection and should not be used as the sole basis for treatment or other patient management decisions. A negative result may occur with  improper specimen collection/handling, submission of specimen other than nasopharyngeal swab, presence of viral mutation(s) within the areas targeted by this assay, and inadequate number of viral copies(<138 copies/mL). A negative result must be combined with clinical observations, patient history, and epidemiological information. The expected result is Negative.  Fact Sheet for Patients:  EntrepreneurPulse.com.au  Fact Sheet for Healthcare Providers:  IncredibleEmployment.be  This test is no t yet approved or cleared by the [place] FDA and  has been authorized for detection and/or diagnosis of SARS-CoV-2 by FDA under an Emergency Use Authorization (EUA). This EUA will remain  in effect (meaning this test can be used) for the duration of the COVID-19 declaration under Section 564(b)(1) of the Act, 21 U.S.C.section 360bbb-3(b)(1), unless the authorization is terminated  or revoked sooner.       Influenza A by PCR NEGATIVE NEGATIVE Final   Influenza B by PCR NEGATIVE NEGATIVE Final    Comment: (NOTE) The Xpert Xpress SARS-CoV-2/FLU/RSV plus assay is intended as an aid in the diagnosis of influenza from Nasopharyngeal swab specimens and should not be used as a sole basis for treatment. Nasal washings and aspirates are unacceptable for Xpert Xpress SARS-CoV-2/FLU/RSV testing.  Fact Sheet for  Patients: EntrepreneurPulse.com.au  Fact Sheet for Healthcare Providers: IncredibleEmployment.be  This test is not yet approved or cleared by the [place] FDA and has been authorized for detection and/or diagnosis of SARS-CoV-2 by FDA under an Emergency Use Authorization (EUA). This EUA will remain in effect (meaning this test can be used) for the duration of the COVID-19 declaration under Section 564(b)(1) of the Act, 21 U.S.C. section 360bbb-3(b)(1), unless the authorization is  terminated or revoked.  Performed at [facility], [place] [address] [place], [place] [address]   Culture, Urine     Status: Abnormal (Preliminary result)   Collection Time: 05/12/20  4:04 PM   Specimen: Urine, Clean Catch  Result Value Ref Range Status   Specimen Description URINE, CLEAN CATCH  Final   Special Requests NONE  Final   Culture (A)  Final    30,000 COLONIES/mL KLEBSIELLA PNEUMONIAE REPEATING SUSCEPTIBILITY Performed at [facility], [place] [address] [place], [place] [address]    Report Status PENDING  Incomplete     Time coordinating discharge: Over 30 minutes  SIGNED:   Charlynne Cousins, MD  [facility] 05/14/2020, 12:44 PM Pager   If 7PM-7AM, please contact night-coverage [email or web address] Password [email or web address]

## 2020-05-14 NOTE — Progress Notes (Signed)
Inpatient Rehab Admissions Coordinator Note:   Per therapy recommendations, pt was screened for CIR candidacy by Clemens Catholic, [place] CCC-SLP. At this time, Pt. does not meet medical necessity criteria for CIR and [facility] medicare very unlikely approve CIR for humerus fx+ failure to thrive. I will not pursue CIR admit at this time.  Clemens Catholic, [place], [place] Admissions Coordinator  [phone] ([place]) [phone] (office)

## 2020-05-15 DIAGNOSIS — N184 Chronic kidney disease, stage 4 (severe): Secondary | ICD-10-CM | POA: Diagnosis not present

## 2020-05-15 DIAGNOSIS — R627 Adult failure to thrive: Secondary | ICD-10-CM | POA: Diagnosis not present

## 2020-05-15 LAB — BASIC METABOLIC PANEL
Anion gap: 7 (ref 5–15)
BUN: 77 mg/dL — ABNORMAL HIGH (ref 8–23)
CO2: 24 mmol/L (ref 22–32)
Calcium: 8.4 mg/dL — ABNORMAL LOW (ref 8.9–10.3)
Chloride: 107 mmol/L (ref 98–111)
Creatinine, Ser: 2.83 mg/dL — ABNORMAL HIGH (ref 0.61–1.24)
GFR, Estimated: 21 mL/min — ABNORMAL LOW (ref >60)
Glucose, Bld: 153 mg/dL — ABNORMAL HIGH (ref 70–99)
Potassium: 3.4 mmol/L — ABNORMAL LOW (ref 3.5–5.1)
Sodium: 138 mmol/L (ref 135–145)

## 2020-05-15 LAB — GLUCOSE, CAPILLARY
Glucose-Capillary: 153 mg/dL — ABNORMAL HIGH (ref 70–99)
Glucose-Capillary: 165 mg/dL — ABNORMAL HIGH (ref 70–99)
Glucose-Capillary: 145 mg/dL — ABNORMAL HIGH (ref 70–99)
Glucose-Capillary: 168 mg/dL — ABNORMAL HIGH (ref 70–99)

## 2020-05-15 LAB — URINE CULTURE: Culture: 30000 — AB

## 2020-05-15 LAB — SARS CORONAVIRUS 2 (TAT 6-24 HRS): SARS Coronavirus 2: NEGATIVE

## 2020-05-15 MED ORDER — SEMAGLUTIDE(0.25 OR 0.5MG/DOS) 2 MG/1.5ML ~~LOC~~ SOPN
0.5000 mg | PEN_INJECTOR | SUBCUTANEOUS | Status: DC
  Administered 2020-05-15: 0.5 mg via SUBCUTANEOUS
  Filled 2020-05-15: qty 0.38

## 2020-05-15 NOTE — Plan of Care (Deleted)
Patient seen and rounded on this am. No changes to d/c plan necessary.  Still stable for d/c today if able to go to facility.   Dwyane Dee, MD [facility] 05/15/2020, 9:12 AM

## 2020-05-15 NOTE — TOC Progression Note (Signed)
Transition of Care [facility]) - Progression Note    Patient Details  Name: Justin Holloway MRN: [id number] Date of Birth: 01-25-1929  Transition of [facility]) CM/SW [place], [place] Phone Number: 05/15/2020, 2:55 PM  Clinical Narrative:    CSW followed up with pt's spouse who advised they had chosen [place]. CSW followed up with facility who will be able to accept tomorrow. CSW notified MD who will request covid. Insurance Josem Kaufmann was updated. SW will continue to follow for DC tomorrow.   Expected Discharge Plan: [place] Barriers to Discharge: Continued Medical Work up  Expected Discharge Plan and Services Expected Discharge Plan: [place] In-house Referral: Clinical Social Work     Living arrangements for the past 2 months: Apartment Expected Discharge Date: 05/14/20                                     Social Determinants of Health (SDOH) Interventions    Readmission Risk Interventions No flowsheet data found.

## 2020-05-15 NOTE — Progress Notes (Signed)
Progress Note    Justin Holloway   OMV:[id number]  DOB: 1929/01/18  DOA: 05/10/2020     5  PCP: [name], MD  CC: falls at home  Hospital Course: 87 y.o.malepast medical history chronic kidney disease stage IV, diabetes mellitus type 2, essential hypertension, TIA usually uses a walker to ambulate felt and sustained a fracture in the shoulder and humerus was seen in the ED and referred for orthopedic surgery as an outpatient with Dr. Marcelino Scot, he recommended to wear a sling and no operative management. CT to rule out pathologic fracture. Since then she has been having difficulty ambulating fell at home on 05/10/2020 labs noted for worsening acute kidney injury x-ray of the left knee noted advanced degenerative changes.  Interval History:  No events overnight.  Resting in bed comfortably this morning.  Amenable for discharge whenever facility found and available.  ROS: Constitutional: negative for chills and fevers, Respiratory: negative for cough, Cardiovascular: negative for chest pain and Gastrointestinal: negative for abdominal pain  Assessment & Plan: Generalized weakness/failure to thrive in adult: Conservative management for recent humerus fracture. Physical therapy evaluated the patient who recommended skilled nursing facility placement. CT of the humerus showed a 2 part fracture of the right proximal humerus nondisplaced no signs of pathologic fracture. Patient is medically stable for discharge to skilled nursing facility.  Right humerus fracture - s/p mechanical fall - continue sling; seen by ortho; no surgical intervention - supportive care  Acute kidney injury on chronic kidney disease stage IV: With a baseline creatinine around 3 on admission 4.2. Restarted back on his IV Lasix and his creatinine seems to be improving slowly. Basic metabolic panels pending this morning, his potassium yesterday was 3.3 replete orally recheck today.  Diabetes mellitus type 2  with hyperglycemia: Continue current regimen blood glucose fairly controlled.  Chronic pain: Continue Ultram and Lyrica.  Constipation: Continue laxative  Old records reviewed in assessment of this patient   DVT prophylaxis: Place TED hose Start: 05/12/20 1609 enoxaparin (LOVENOX) injection 30 mg Start: 05/10/20 1000   Code Status:   Code Status: DNR Family Communication:   Disposition Plan: Status is: Inpatient  Remains inpatient appropriate because:Unsafe d/c plan   Dispo: The patient is from: Home              Anticipated d/c is to: SNF              Patient currently is medically stable to d/c.   Difficult to place patient No  Risk of unplanned readmission score: Unplanned Admission- Pilot do not use: 19.54   Objective: Blood pressure (!) 109/55, pulse 83, temperature 97.9 F (36.6 C), temperature source Oral, resp. rate 17, weight 101.9 kg, SpO2 100 %.  Examination: General appearance: alert, cooperative and no distress Head: Normocephalic, without obvious abnormality, atraumatic Eyes: EOMI Lungs: clear to auscultation bilaterally Heart: regular rate and rhythm and S1, S2 normal Abdomen: normal findings: bowel sounds normal and soft, non-tender Extremities: no edema; right shoulder in sling Skin: mobility and turgor normal and no edema Neurologic: Grossly normal  Data Reviewed: I have personally reviewed following labs and imaging studies Results for orders placed or performed during the hospital encounter of 05/10/20 (from the past 24 hour(s))  Glucose, capillary     Status: Abnormal   Collection Time: 05/14/20  4:35 PM  Result Value Ref Range   Glucose-Capillary 134 (H) 70 - 99 mg/dL  Glucose, capillary     Status: Abnormal   Collection Time:  05/14/20  8:51 PM  Result Value Ref Range   Glucose-Capillary 128 (H) 70 - 99 mg/dL  Basic metabolic panel     Status: Abnormal   Collection Time: 05/15/20  2:01 AM  Result Value Ref Range   Sodium 138 135 - 145  mmol/L   Potassium 3.4 (L) 3.5 - 5.1 mmol/L   Chloride 107 98 - 111 mmol/L   CO2 24 22 - 32 mmol/L   Glucose, Bld 153 (H) 70 - 99 mg/dL   BUN 77 (H) 8 - 23 mg/dL   Creatinine, Ser 2.83 (H) 0.61 - 1.24 mg/dL   Calcium 8.4 (L) 8.9 - 10.3 mg/dL   GFR, Estimated 21 (L) >60 mL/min   Anion gap 7 5 - 15  Glucose, capillary     Status: Abnormal   Collection Time: 05/15/20  6:40 AM  Result Value Ref Range   Glucose-Capillary 153 (H) 70 - 99 mg/dL  Glucose, capillary     Status: Abnormal   Collection Time: 05/15/20 11:59 AM  Result Value Ref Range   Glucose-Capillary 165 (H) 70 - 99 mg/dL    Recent Results (from the past 240 hour(s))  Resp Panel by RT-PCR (Flu A&B, Covid) Nasopharyngeal Swab     Status: None   Collection Time: 05/10/20  7:25 AM   Specimen: Nasopharyngeal Swab; Nasopharyngeal(NP) swabs in vial transport medium  Result Value Ref Range Status   SARS Coronavirus 2 by RT PCR NEGATIVE NEGATIVE Final    Comment: (NOTE) SARS-CoV-2 target nucleic acids are NOT DETECTED.  The SARS-CoV-2 RNA is generally detectable in upper respiratory specimens during the acute phase of infection. The lowest concentration of SARS-CoV-2 viral copies this assay can detect is 138 copies/mL. A negative result does not preclude SARS-Cov-2 infection and should not be used as the sole basis for treatment or other patient management decisions. A negative result may occur with  improper specimen collection/handling, submission of specimen other than nasopharyngeal swab, presence of viral mutation(s) within the areas targeted by this assay, and inadequate number of viral copies(<138 copies/mL). A negative result must be combined with clinical observations, patient history, and epidemiological information. The expected result is Negative.  Fact Sheet for Patients:  EntrepreneurPulse.com.au  Fact Sheet for Healthcare Providers:  IncredibleEmployment.be  This test is  no t yet approved or cleared by the [place] FDA and  has been authorized for detection and/or diagnosis of SARS-CoV-2 by FDA under an Emergency Use Authorization (EUA). This EUA will remain  in effect (meaning this test can be used) for the duration of the COVID-19 declaration under Section 564(b)(1) of the Act, 21 U.S.C.section 360bbb-3(b)(1), unless the authorization is terminated  or revoked sooner.       Influenza A by PCR NEGATIVE NEGATIVE Final   Influenza B by PCR NEGATIVE NEGATIVE Final    Comment: (NOTE) The Xpert Xpress SARS-CoV-2/FLU/RSV plus assay is intended as an aid in the diagnosis of influenza from Nasopharyngeal swab specimens and should not be used as a sole basis for treatment. Nasal washings and aspirates are unacceptable for Xpert Xpress SARS-CoV-2/FLU/RSV testing.  Fact Sheet for Patients: EntrepreneurPulse.com.au  Fact Sheet for Healthcare Providers: IncredibleEmployment.be  This test is not yet approved or cleared by the [place] FDA and has been authorized for detection and/or diagnosis of SARS-CoV-2 by FDA under an Emergency Use Authorization (EUA). This EUA will remain in effect (meaning this test can be used) for the duration of the COVID-19 declaration under Section 564(b)(1) of the Act,  21 U.S.C. section 360bbb-3(b)(1), unless the authorization is terminated or revoked.  Performed at [facility], [place] [address], [place], [place] [address]   Culture, Urine     Status: Abnormal   Collection Time: 05/12/20  4:04 PM   Specimen: Urine, Clean Catch  Result Value Ref Range Status   Specimen Description URINE, CLEAN CATCH  Final   Special Requests   Final    NONE Performed at [facility], [place] [address] [place], [place] [address]    Culture 30,000 COLONIES/mL KLEBSIELLA PNEUMONIAE (A)  Final   Report Status 05/15/2020 FINAL  Final   Organism ID, Bacteria KLEBSIELLA PNEUMONIAE (A)  Final       Susceptibility   Klebsiella pneumoniae - MIC*    AMPICILLIN RESISTANT Resistant     CEFAZOLIN <=4 SENSITIVE Sensitive     CEFEPIME <=0.12 SENSITIVE Sensitive     CEFTRIAXONE <=0.25 SENSITIVE Sensitive     CIPROFLOXACIN <=0.25 SENSITIVE Sensitive     GENTAMICIN <=1 SENSITIVE Sensitive     IMIPENEM <=0.25 SENSITIVE Sensitive     NITROFURANTOIN <=16 SENSITIVE Sensitive     TRIMETH/SULFA <=20 SENSITIVE Sensitive     AMPICILLIN/SULBACTAM 4 SENSITIVE Sensitive     PIP/TAZO <=4 SENSITIVE Sensitive     * 30,000 COLONIES/mL KLEBSIELLA PNEUMONIAE     Radiology Studies: No results found. US RENAL  Final Result    CT HUMERUS RIGHT WO CONTRAST  Final Result    DG Knee Complete 4 Views Left  Final Result      Scheduled Meds: . (feeding supplement) PROSource Plus  30 mL Oral BID BM  . aspirin EC  81 mg Oral QPM  . brimonidine  1 drop Both Eyes BID   And  . timolol  1 drop Both Eyes BID  . enoxaparin (LOVENOX) injection  30 mg Subcutaneous Q24H  . furosemide  80 mg Oral QAC breakfast   And  . furosemide  40 mg Oral q1800  . hydrALAZINE  10 mg Oral TID  . insulin aspart  0-9 Units Subcutaneous TID WC  . insulin glargine  5 Units Subcutaneous BID  . latanoprost  1 drop Both Eyes QHS  . multivitamin with minerals  1 tablet Oral Daily  . pravastatin  40 mg Oral QHS  . pregabalin  25 mg Oral QHS  . tamsulosin  0.4 mg Oral Daily   PRN Meds: acetaminophen **OR** acetaminophen, calcium carbonate (dosed in mg elemental calcium), camphor-menthol **AND** hydrOXYzine, feeding supplement (NEPRO CARB STEADY), hydrALAZINE, ondansetron **OR** ondansetron (ZOFRAN) IV, traMADol, zolpidem Continuous Infusions:   LOS: 5 days  Time spent: Greater than 50% of the 35 minute visit was spent in counseling/coordination of care for the patient as laid out in the A&P.   Dwyane Dee, MD [facility] 05/15/2020, 12:16 PM

## 2020-05-15 NOTE — Plan of Care (Signed)
°  Problem: Coping: °Goal: Level of anxiety will decrease °Outcome: Progressing °  °

## 2020-05-16 DIAGNOSIS — N179 Acute kidney failure, unspecified: Secondary | ICD-10-CM | POA: Diagnosis not present

## 2020-05-16 DIAGNOSIS — R627 Adult failure to thrive: Secondary | ICD-10-CM | POA: Diagnosis not present

## 2020-05-16 LAB — GLUCOSE, CAPILLARY
Glucose-Capillary: 173 mg/dL — ABNORMAL HIGH (ref 70–99)
Glucose-Capillary: 162 mg/dL — ABNORMAL HIGH (ref 70–99)
Glucose-Capillary: 153 mg/dL — ABNORMAL HIGH (ref 70–99)
Glucose-Capillary: 164 mg/dL — ABNORMAL HIGH (ref 70–99)

## 2020-05-16 NOTE — Progress Notes (Signed)
Inpatient Rehab Admissions Coordinator:   Met with pt. To discuss potential CIR admission. Pt.'s insurance is unlikely to approve CIR for pt.'s dx. Family aware, with plans for Pt. To d/c to [facility] for short term rehab.  Clemens Catholic, [place], [place] Admissions Coordinator  [phone] ([place]) [phone] (office)

## 2020-05-16 NOTE — Progress Notes (Signed)
[facility] PROGRESS NOTE    Progress Note  Justin Holloway  QMG:[id number] DOB: Jan 11, 1929 DOA: 05/10/2020 PCP: [name], MD     Brief Narrative:   Justin Holloway is an 87 y.o. male past medical history chronic kidney disease stage IV, diabetes mellitus type 2, essential hypertension, TIA usually uses a walker to ambulate felt and sustained a fracture in the shoulder and humerus was seen in the ED and referred for orthopedic surgery as an outpatient with Dr. Marcelino Scot, he recommended to wear a sling and no operative management.  CT to rule out pathologic fracture.  Since then she has been having difficulty ambulating fell at home on 05/10/2020 labs noted for worsening acute kidney injury x-ray of the left knee noted advanced degenerative changes.  Patient is medically stable to be transferred to skilled nursing facility.   Assessment/Plan:   Generalized weakness/failure to thrive in adult: Conservative management for recent humerus fracture. CT of the humerus showed a 2 part fracture of the right proximal humerus nondisplaced no signs of pathologic fracture. Patient is medically stable for discharge to skilled nursing facility.  Acute kidney injury on chronic kidney disease stage IV: With a baseline creatinine around 3 on admission 4.2. Creatinine has returned to baseline.  Diabetes mellitus type 2 with hyperglycemia: Continue current regimen blood glucose fairly controlled.  Chronic pain: Continue Ultram and Lyrica.  Constipation: Continue laxative   DVT prophylaxis: lovenox Family Communication: Son and wife Status is: Inpatient  Remains inpatient appropriate because:Hemodynamically unstable   Dispo: The patient is from: Home              Anticipated d/c is to: SNF              Patient currently is not medically stable to d/c.   Difficult to place patient No        Code Status:     Code Status Orders  (From admission, onward)         Start      Ordered   05/10/20 0842  Do not attempt resuscitation (DNR)  Continuous       Question Answer Comment  In the event of cardiac or respiratory ARREST Do not call a "code blue"   In the event of cardiac or respiratory ARREST Do not perform Intubation, CPR, defibrillation or ACLS   In the event of cardiac or respiratory ARREST Use medication by any route, position, wound care, and other measures to relive pain and suffering. May use oxygen, suction and manual treatment of airway obstruction as needed for comfort.      05/10/20 0844        Code Status History    Date Active Date Inactive Code Status Order ID Comments User Context   09/09/2017 1431 09/10/2017 1721 Full Code [id number]  Cristal Deer, MD Inpatient   08/07/2015 2006 08/09/2015 1748 Full Code [id number]  Ivor Costa, MD ED   Advance Care Planning Activity        IV Access:    Peripheral IV   Procedures and diagnostic studies:   No results found.   Medical Consultants:    None.   Subjective:    Justin Holloway no new complaints feels great.  Objective:    Vitals:   05/15/20 1152 05/15/20 1655 05/15/20 2032 05/16/20 0558  BP: (!) 109/55 (!) 112/57 (!) 123/59 115/66  Pulse: 83 88 91 77  Resp: 17 16 20  (!) 22  Temp: 97.9 F (36.6 C) 98.2  F (36.8 C) 98.3 F (36.8 C) 98.1 F (36.7 C)  TempSrc: Oral Oral Oral   SpO2: 100% 99% 100% 97%  Weight:       SpO2: 97 %   Intake/Output Summary (Last 24 hours) at 05/16/2020 0933 Last data filed at 05/15/2020 1700 Gross per 24 hour  Intake 960 ml  Output --  Net 960 ml   Filed Weights   05/13/20 0431 05/14/20 2051  Weight: 101.9 kg 101.9 kg    Exam: General exam: In no acute distress. Respiratory system: Good air movement and clear to auscultation. Cardiovascular system: S1 & S2 heard, RRR. No JVD. Gastrointestinal system: Abdomen is nondistended, soft and nontender.  Extremities: No pedal edema. Skin: No rashes, lesions or ulcers Psychiatry:  Judgement and insight appear normal. Mood & affect appropriate.   Data Reviewed:    Labs: Basic Metabolic Panel: Recent Labs  Lab 05/11/20 0406 05/12/20 0142 05/13/20 1018 05/14/20 0548 05/15/20 0201  NA 134* 135 137 137 138  K 3.6 3.3* 3.6 3.9 3.4*  CL 100 102 103 106 107  CO2 22 22 23 22 24   GLUCOSE 116* 84 122* 142* 153*  BUN 80* 82* 76* 76* 77*  CREATININE 3.95* 3.53* 3.10* 3.16* 2.83*  CALCIUM 8.3* 8.1* 8.6* 8.3* 8.4*   GFR Estimated Creatinine Clearance: 19.8 mL/min (A) (by C-G formula based on SCr of 2.83 mg/dL (H)). Liver Function Tests: No results for input(s): AST, ALT, ALKPHOS, BILITOT, PROT, ALBUMIN in the last 168 hours. No results for input(s): LIPASE, AMYLASE in the last 168 hours. No results for input(s): AMMONIA in the last 168 hours. Coagulation profile No results for input(s): INR, PROTIME in the last 168 hours. COVID-19 Labs  No results for input(s): DDIMER, FERRITIN, LDH, CRP in the last 72 hours.  Lab Results  Component Value Date   SARSCOV2NAA NEGATIVE 05/15/2020   [place] NEGATIVE 05/10/2020    CBC: Recent Labs  Lab 05/10/20 0353 05/11/20 0406 05/12/20 0142  WBC 11.2* 10.1 8.5  NEUTROABS 9.7*  --   --   HGB 9.5* 8.5* 8.2*  HCT 29.3* 26.0* 25.3*  MCV 91.3 91.9 92.0  PLT 226 246 258   Cardiac Enzymes: No results for input(s): CKTOTAL, CKMB, CKMBINDEX, TROPONINI in the last 168 hours. BNP (last 3 results) No results for input(s): PROBNP in the last 8760 hours. CBG: Recent Labs  Lab 05/15/20 0640 05/15/20 1159 05/15/20 1656 05/15/20 2034 05/16/20 0717  GLUCAP 153* 165* 145* 168* 173*   D-Dimer: No results for input(s): DDIMER in the last 72 hours. Hgb A1c: No results for input(s): HGBA1C in the last 72 hours. Lipid Profile: No results for input(s): CHOL, HDL, LDLCALC, TRIG, CHOLHDL, LDLDIRECT in the last 72 hours. Thyroid function studies: No results for input(s): TSH, T4TOTAL, T3FREE, THYROIDAB in the last 72  hours.  Invalid input(s): FREET3 Anemia work up: No results for input(s): VITAMINB12, FOLATE, FERRITIN, TIBC, IRON, RETICCTPCT in the last 72 hours. Sepsis Labs: Recent Labs  Lab 05/10/20 0353 05/11/20 0406 05/12/20 0142  WBC 11.2* 10.1 8.5   Microbiology Recent Results (from the past 240 hour(s))  Resp Panel by RT-PCR (Flu A&B, Covid) Nasopharyngeal Swab     Status: None   Collection Time: 05/10/20  7:25 AM   Specimen: Nasopharyngeal Swab; Nasopharyngeal(NP) swabs in vial transport medium  Result Value Ref Range Status   SARS Coronavirus 2 by RT PCR NEGATIVE NEGATIVE Final    Comment: (NOTE) SARS-CoV-2 target nucleic acids are NOT DETECTED.  The SARS-CoV-2  RNA is generally detectable in upper respiratory specimens during the acute phase of infection. The lowest concentration of SARS-CoV-2 viral copies this assay can detect is 138 copies/mL. A negative result does not preclude SARS-Cov-2 infection and should not be used as the sole basis for treatment or other patient management decisions. A negative result may occur with  improper specimen collection/handling, submission of specimen other than nasopharyngeal swab, presence of viral mutation(s) within the areas targeted by this assay, and inadequate number of viral copies(<138 copies/mL). A negative result must be combined with clinical observations, patient history, and epidemiological information. The expected result is Negative.  Fact Sheet for Patients:  EntrepreneurPulse.com.au  Fact Sheet for Healthcare Providers:  IncredibleEmployment.be  This test is no t yet approved or cleared by the [place] FDA and  has been authorized for detection and/or diagnosis of SARS-CoV-2 by FDA under an Emergency Use Authorization (EUA). This EUA will remain  in effect (meaning this test can be used) for the duration of the COVID-19 declaration under Section 564(b)(1) of the Act,  21 U.S.C.section 360bbb-3(b)(1), unless the authorization is terminated  or revoked sooner.       Influenza A by PCR NEGATIVE NEGATIVE Final   Influenza B by PCR NEGATIVE NEGATIVE Final    Comment: (NOTE) The Xpert Xpress SARS-CoV-2/FLU/RSV plus assay is intended as an aid in the diagnosis of influenza from Nasopharyngeal swab specimens and should not be used as a sole basis for treatment. Nasal washings and aspirates are unacceptable for Xpert Xpress SARS-CoV-2/FLU/RSV testing.  Fact Sheet for Patients: EntrepreneurPulse.com.au  Fact Sheet for Healthcare Providers: IncredibleEmployment.be  This test is not yet approved or cleared by the [place] FDA and has been authorized for detection and/or diagnosis of SARS-CoV-2 by FDA under an Emergency Use Authorization (EUA). This EUA will remain in effect (meaning this test can be used) for the duration of the COVID-19 declaration under Section 564(b)(1) of the Act, 21 U.S.C. section 360bbb-3(b)(1), unless the authorization is terminated or revoked.  Performed at [facility], [place] [address] [place], [place] [address]   Culture, Urine     Status: Abnormal   Collection Time: 05/12/20  4:04 PM   Specimen: Urine, Clean Catch  Result Value Ref Range Status   Specimen Description URINE, CLEAN CATCH  Final   Special Requests   Final    NONE Performed at [facility], [place] [address] [place], [place] [address]    Culture 30,000 COLONIES/mL KLEBSIELLA PNEUMONIAE (A)  Final   Report Status 05/15/2020 FINAL  Final   Organism ID, Bacteria KLEBSIELLA PNEUMONIAE (A)  Final      Susceptibility   Klebsiella pneumoniae - MIC*    AMPICILLIN RESISTANT Resistant     CEFAZOLIN <=4 SENSITIVE Sensitive     CEFEPIME <=0.12 SENSITIVE Sensitive     CEFTRIAXONE <=0.25 SENSITIVE Sensitive     CIPROFLOXACIN <=0.25 SENSITIVE Sensitive     GENTAMICIN <=1 SENSITIVE Sensitive     IMIPENEM <=0.25  SENSITIVE Sensitive     NITROFURANTOIN <=16 SENSITIVE Sensitive     TRIMETH/SULFA <=20 SENSITIVE Sensitive     AMPICILLIN/SULBACTAM 4 SENSITIVE Sensitive     PIP/TAZO <=4 SENSITIVE Sensitive     * 30,000 COLONIES/mL KLEBSIELLA PNEUMONIAE  SARS CORONAVIRUS 2 (TAT 6-24 HRS) Nasopharyngeal Nasopharyngeal Swab     Status: None   Collection Time: 05/15/20  5:56 PM   Specimen: Nasopharyngeal Swab  Result Value Ref Range Status   SARS Coronavirus 2 NEGATIVE NEGATIVE  Final    Comment: (NOTE) SARS-CoV-2 target nucleic acids are NOT DETECTED.  The SARS-CoV-2 RNA is generally detectable in upper and lower respiratory specimens during the acute phase of infection. Negative results do not preclude SARS-CoV-2 infection, do not rule out co-infections with other pathogens, and should not be used as the sole basis for treatment or other patient management decisions. Negative results must be combined with clinical observations, patient history, and epidemiological information. The expected result is Negative.  Fact Sheet for Patients: SugarRoll.be  Fact Sheet for Healthcare Providers: https://www.woods-mathews.com/  This test is not yet approved or cleared by the [place] FDA and  has been authorized for detection and/or diagnosis of SARS-CoV-2 by FDA under an Emergency Use Authorization (EUA). This EUA will remain  in effect (meaning this test can be used) for the duration of the COVID-19 declaration under Se ction 564(b)(1) of the Act, 21 U.S.C. section 360bbb-3(b)(1), unless the authorization is terminated or revoked sooner.  Performed at [facility], [place] [address], [place], [place] [address]      Medications:   . (feeding supplement) PROSource Plus  30 mL Oral BID BM  . aspirin EC  81 mg Oral QPM  . brimonidine  1 drop Both Eyes BID   And  . timolol  1 drop Both Eyes BID  . enoxaparin (LOVENOX) injection  30 mg Subcutaneous Q24H   . furosemide  80 mg Oral QAC breakfast   And  . furosemide  40 mg Oral q1800  . hydrALAZINE  10 mg Oral TID  . insulin aspart  0-9 Units Subcutaneous TID WC  . insulin glargine  5 Units Subcutaneous BID  . latanoprost  1 drop Both Eyes QHS  . multivitamin with minerals  1 tablet Oral Daily  . pravastatin  40 mg Oral QHS  . pregabalin  25 mg Oral QHS  . Semaglutide(0.25 or 0.5MG /DOS)  0.5 mg Subcutaneous Q Sat-1800  . tamsulosin  0.4 mg Oral Daily   Continuous Infusions:     LOS: 6 days   [facility]  05/16/2020, 9:33 AM

## 2020-05-16 NOTE — Plan of Care (Signed)
  Problem: Clinical Measurements: Goal: Diagnostic test results will improve Outcome: Adequate for Discharge   

## 2020-05-17 DIAGNOSIS — K5904 Chronic idiopathic constipation: Secondary | ICD-10-CM | POA: Diagnosis not present

## 2020-05-17 DIAGNOSIS — N179 Acute kidney failure, unspecified: Secondary | ICD-10-CM | POA: Diagnosis not present

## 2020-05-17 DIAGNOSIS — R627 Adult failure to thrive: Secondary | ICD-10-CM | POA: Diagnosis not present

## 2020-05-17 DIAGNOSIS — N184 Chronic kidney disease, stage 4 (severe): Secondary | ICD-10-CM | POA: Diagnosis not present

## 2020-05-17 LAB — GLUCOSE, CAPILLARY
Glucose-Capillary: 155 mg/dL — ABNORMAL HIGH (ref 70–99)
Glucose-Capillary: 157 mg/dL — ABNORMAL HIGH (ref 70–99)
Glucose-Capillary: 111 mg/dL — ABNORMAL HIGH (ref 70–99)
Glucose-Capillary: 135 mg/dL — ABNORMAL HIGH (ref 70–99)

## 2020-05-17 LAB — RESP PANEL BY RT-PCR (FLU A&B, COVID) ARPGX2
Influenza A by PCR: NEGATIVE
Influenza B by PCR: NEGATIVE
SARS Coronavirus 2 by RT PCR: NEGATIVE

## 2020-05-17 NOTE — TOC Progression Note (Signed)
Transition of Care (TOC) - Progression Note  *Discharge to [facility] 05/18/20    Patient Details  Name: Justin Holloway MRN: [id number] Date of Birth: 10-11-1929  Transition of Care [facility]) CM/SW Contact  Sharlet Salina Mila Homer, LCSW Phone Number: 05/17/2020, 6:20 PM  Clinical Narrative:  CSW reviewed notes which indicated that patient discharging to [facility]. Received information from [place], admissions director at [facility] that she could take patient tomorrow. Contacted [facility] and confirmed that they can accept patient on Tuesday, 5/10.   11:35 am: Talked with patient and wife regarding [facility] being able to accept patient tomorrow and they would prefer patient discharging to [facility]. MD contacted and updated. [facility] admissions staff person contacted and updated.  11:54 am: Contacted [facility], spoke with [place] and informed her of change in nursing facility  2:47 pm: Received call from [place] at [facility] and patient approved to d/c to [facility] on Tuesday, 5/10. Authorization number will be provided by case manager. [facility] reference number is [id number] and authorized for 3 days starting 5/10. Next review date is 5/12 and care coordinator is Blenda Peals. Fax number for continued stay clinicals - [phone].  Expected Discharge Plan: [place] Barriers to Discharge: Continued Medical Work up  Expected Discharge Plan and Services Expected Discharge Plan: [place] In-house Referral: Clinical Social Work     Living arrangements for the past 2 months: Apartment Expected Discharge Date: 05/14/20                                   Social Determinants of Health (SDOH) Interventions  No SDOH interventions requested or needed at this time.  Readmission Risk Interventions No flowsheet data found.

## 2020-05-17 NOTE — Discharge Summary (Signed)
Physician Discharge Summary  Justin Holloway BZJ:[id number] DOB: 01/18/1929 DOA: 05/10/2020  PCP: [name], MD  Admit date: 05/10/2020 Discharge date: 05/17/2020  Admitted From: Home Disposition:  SNF  Recommendations for Outpatient Follow-up:  1. Follow up with PCP in 1-2 weeks 2. Please obtain BMP/CBC in one week   Home Health:No Equipment/Devices:None  Discharge Condition:Stable CODE STATUS:Full Diet recommendation: Heart Healthy  Brief/Interim Summary: 87 y.o. male past medical history chronic kidney disease stage IV, diabetes mellitus type 2, essential hypertension, TIA usually uses a walker to ambulate felt and sustained a fracture in the shoulder and humerus was seen in the ED and referred for orthopedic surgery as an outpatient with Dr. Marcelino Scot, he recommended to wear a sling and no operative management.  CT to rule out pathologic fracture.  Since then she has been having difficulty ambulating fell at home on 05/10/2020 labs noted for worsening acute kidney injury x-ray of the left knee noted advanced degenerative changes.  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  Acute kidney injury on chronic kidney disease stage IV: With a baseline creatinine around 3 on admission 4.2 he was started on IV fluids and his creatinine returned to baseline. Patient has refused dialysis in the past he reiterates this.  Diabetes mellitus type 2 with hyperglycemia: Currently well  controlled no change made to his medication.  Chronic pain: Continue Ultram and Lyrica.  Constipation: He was started on laxatives.   Discharge Instructions  Discharge Instructions    Diet - low sodium heart healthy   Complete by: As directed    Increase activity slowly   Complete by: As directed      Allergies as of 05/17/2020      Reactions   Gabapentin Itching      Medication List    TAKE these medications   aspirin EC 81 MG tablet Take 81 mg by mouth every evening.   cholecalciferol 1000 units tablet Commonly known as: VITAMIN D Take 2,000 Units by mouth daily.   Combigan 0.2-0.5 % ophthalmic solution Generic drug: brimonidine-timolol Place 1 drop into both eyes every 12 (twelve) hours.   diclofenac Sodium 1 % Gel Commonly known as: VOLTAREN Apply 4 g topically 4 (four) times daily. What changed:   how much to take  when to take this  additional instructions   ferrous sulfate 325 (65 FE) MG tablet Take 325 mg by mouth daily with breakfast.   furosemide 80 MG tablet Commonly known as: LASIX TAKE 1 TABLET BY MOUTH IN  THE MORNING AND ONE-HALF  TABLET BY MOUTH IN THE  EARLY EVENING What changed:   how much to take  how to take this  when to take this  additional instructions   hydrALAZINE 10 MG tablet Commonly known as: APRESOLINE Take 10 mg by mouth 3 (three) times daily.   insulin degludec 200 UNIT/ML FlexTouch Pen Commonly known as: TRESIBA Inject 23 Units into the skin at bedtime.   Lumigan 0.01 % Soln Generic drug:  bimatoprost Place 1 drop into both eyes 2 (two) times daily.   multivitamin with minerals Tabs tablet Take 1 tablet by mouth daily.   ONE TOUCH ULTRA 2 w/Device Kit Use as directed to check blood sugars 2 times per day dx: X10.62   OneTouch Delica Plus [id number] Misc 100 each by Does not apply route in the morning, at noon, and at bedtime.   OneTouch Ultra test strip Generic drug: glucose blood USE STRIPS TO TEST  BLOOD  SUGAR 3 TIMES DAILY 1 STRIP PER TEST   Ozempic (0.25 or 0.5 MG/DOSE) 2 MG/1.5ML Sopn Generic drug: Semaglutide(0.25 or 0.5MG/DOS) Inject 0.5 mg into the skin once a week. What changed: when to take this   polyethylene glycol powder 17 GM/SCOOP powder Commonly known as: GLYCOLAX/MIRALAX Take 17 g by mouth daily as needed for mild constipation.   pravastatin 40 MG tablet Commonly known as: PRAVACHOL Take 1 tablet (40 mg total) by mouth daily. What changed: when to take this   pregabalin 25 MG capsule Commonly known as: LYRICA Take 1 capsule (25 mg total) by mouth at bedtime. What changed: See the new instructions.   tamsulosin 0.4 MG Caps capsule Commonly known as: FLOMAX Take 0.4 mg by mouth daily.   traMADol 50 MG tablet Commonly known as: ULTRAM Take 1 tablet (50 mg total) by mouth every 6 (six) hours as needed for moderate pain.   vitamin C 1000 MG tablet Take 1,000 mg by mouth daily.       Contact information for follow-up providers    Rosita Fire, MD Follow up in 3 week(s).   Specialties: Nephrology, Internal Medicine Why: We will call with appt details Contact information: [place] [address] [phone]            Contact information for after-discharge care    Destination    [facility] .   Service: Skilled Nursing Contact information: [address] [address] [phone]                 Allergies  Allergen Reactions  . Gabapentin Itching    Consultations:  None   Procedures/Studies: DG Shoulder Right  Result Date: 05/04/2020 CLINICAL DATA:  Right shoulder pain and limited range of motion since the patient fell out of bed this morning. EXAM: RIGHT SHOULDER - 2+ VIEW COMPARISON:  None. FINDINGS: The patient has a mildly impacted surgical neck fracture of the humerus. The humeral head and neck have a mottled appearance. The acromioclavicular joint is  intact with mild degenerative change present. Soft tissues are unremarkable. IMPRESSION: Mildly impacted surgical neck fracture of the humerus. Mild appearance of the humeral head could be due to osteopenia but underlying tumor is possible. Electronically Signed   By: Inge Rise M.D.   On: 05/04/2020 15:39   CT HUMERUS RIGHT WO CONTRAST  Result Date: 05/11/2020 CLINICAL DATA:  Humeral fracture EXAM: CT OF THE RIGHT HUMERUS WITHOUT CONTRAST TECHNIQUE: Multidetector CT imaging was performed according to the standard protocol. Multiplanar CT image reconstructions were also generated. COMPARISON:  05/04/2020 radiograph FINDINGS: Bones/Joint/Cartilage Impacted fracture of the surgical neck of the humerus with apex anterior angulation and 2.2 cm of displacement. Mild comminution. A fracture plane extends through the greater tuberosity but is not displaced and hence does not count as a separate fragment in the Neer classification. Accordingly this is a Neer 2 part fracture. There is a 0.6 by 0.6 cm cortical fragment imbedded along  the fracture plane on image 29 of series 6. Glenohumeral spurring.  No other humeral fracture is identified. Ligaments Suboptimally assessed by CT. Muscles and Tendons Indistinctness of tissue planes along the fracture site particularly along the adjacent subscapularis muscle. Soft tissues Linear subsegmental atelectasis or scarring in the left lower lobe. IMPRESSION: 1. Neer 2 part fracture of the right proximal humerus as described above. The fracture plane extending through the greater tuberosity is relatively nondisplaced thus does not count as a separate "part" under the Neer classification. 2. Degenerative glenohumeral spurring. Electronically Signed   By: Van Clines M.D.   On: 05/11/2020 14:25   US RENAL  Result Date: 05/11/2020 CLINICAL DATA:  Acute renal failure. EXAM: RENAL / URINARY TRACT ULTRASOUND COMPLETE COMPARISON:  CT 10/21/2018. FINDINGS: Right Kidney: Renal  measurements: 8.5 x 4.7 x 4.3 cm = volume: 88.7 mL. Cortical thinning. Mild increased echogenicity. No mass or hydronephrosis visualized. Left Kidney: Renal measurements: 9.7 x 5.5 x 4.7 cm = volume: 129.2 mL. Difficult visualized. Mild increased echogenicity. No mass or hydronephrosis visualized. Bladder: Appears normal for degree of bladder distention. Other: None. IMPRESSION: 1. Mild increased echogenicity both kidneys suggesting chronic medical renal disease. Right renal cortical thinning. 2.  No acute abnormality.  No hydronephrosis or bladder distention. Electronically Signed   By: Marcello Moores  Register   On: 05/11/2020 16:28   DG Knee Complete 4 Views Left  Result Date: 05/10/2020 CLINICAL DATA:  Left knee "gave out". EXAM: LEFT KNEE - COMPLETE 4+ VIEW COMPARISON:  None. FINDINGS: No evidence of an acute fracture or dislocation. Marked severity medial and lateral tibiofemoral compartment space narrowing is seen. Marked severity patellofemoral narrowing is also noted. There is a small to moderate sized knee effusion. IMPRESSION: 1. Marked severity degenerative changes without an acute osseous abnormality. 2. Small to moderate sized joint effusion. Electronically Signed   By: Virgina Norfolk M.D.   On: 05/10/2020 03:59     Subjective: No new complaints feels great.  Discharge Exam: Vitals:   05/17/20 0614 05/17/20 0919  BP: 119/65 (!) 123/58  Pulse: 75 75  Resp: 16 18  Temp: 98.1 F (36.7 C) 98.2 F (36.8 C)  SpO2: 98% 100%   Vitals:   05/16/20 1720 05/16/20 2104 05/17/20 0614 05/17/20 0919  BP: (!) 120/57 (!) 124/57 119/65 (!) 123/58  Pulse: 76 77 75 75  Resp: 19 (!) 22 16 18   Temp: (!) 97.5 F (36.4 C) 98.1 F (36.7 C) 98.1 F (36.7 C) 98.2 F (36.8 C)  TempSrc: Oral Oral Oral   SpO2: 98% 99% 98% 100%  Weight:        General: Pt is alert, awake, not in acute distress Cardiovascular: RRR, S1/S2 +, no rubs, no gallops Respiratory: CTA bilaterally, no wheezing, no  rhonchi Abdominal: Soft, NT, ND, bowel sounds + Extremities: no edema, no cyanosis    The results of significant diagnostics from this hospitalization (including imaging, microbiology, ancillary and laboratory) are listed below for reference.     Microbiology: Recent Results (from the past 240 hour(s))  Resp Panel by RT-PCR (Flu A&B, Covid) Nasopharyngeal Swab     Status: None   Collection Time: 05/10/20  7:25 AM   Specimen: Nasopharyngeal Swab; Nasopharyngeal(NP) swabs in vial transport medium  Result Value Ref Range Status   SARS Coronavirus 2 by RT PCR NEGATIVE NEGATIVE Final    Comment: (NOTE) SARS-CoV-2 target nucleic acids are NOT DETECTED.  The SARS-CoV-2 RNA is generally detectable in upper respiratory specimens during the acute  phase of infection. The lowest concentration of SARS-CoV-2 viral copies this assay can detect is 138 copies/mL. A negative result does not preclude SARS-Cov-2 infection and should not be used as the sole basis for treatment or other patient management decisions. A negative result may occur with  improper specimen collection/handling, submission of specimen other than nasopharyngeal swab, presence of viral mutation(s) within the areas targeted by this assay, and inadequate number of viral copies(<138 copies/mL). A negative result must be combined with clinical observations, patient history, and epidemiological information. The expected result is Negative.  Fact Sheet for Patients:  EntrepreneurPulse.com.au  Fact Sheet for Healthcare Providers:  IncredibleEmployment.be  This test is no t yet approved or cleared by the [place] FDA and  has been authorized for detection and/or diagnosis of SARS-CoV-2 by FDA under an Emergency Use Authorization (EUA). This EUA will remain  in effect (meaning this test can be used) for the duration of the COVID-19 declaration under Section 564(b)(1) of the Act,  21 U.S.C.section 360bbb-3(b)(1), unless the authorization is terminated  or revoked sooner.       Influenza A by PCR NEGATIVE NEGATIVE Final   Influenza B by PCR NEGATIVE NEGATIVE Final    Comment: (NOTE) The Xpert Xpress SARS-CoV-2/FLU/RSV plus assay is intended as an aid in the diagnosis of influenza from Nasopharyngeal swab specimens and should not be used as a sole basis for treatment. Nasal washings and aspirates are unacceptable for Xpert Xpress SARS-CoV-2/FLU/RSV testing.  Fact Sheet for Patients: EntrepreneurPulse.com.au  Fact Sheet for Healthcare Providers: IncredibleEmployment.be  This test is not yet approved or cleared by the [place] FDA and has been authorized for detection and/or diagnosis of SARS-CoV-2 by FDA under an Emergency Use Authorization (EUA). This EUA will remain in effect (meaning this test can be used) for the duration of the COVID-19 declaration under Section 564(b)(1) of the Act, 21 U.S.C. section 360bbb-3(b)(1), unless the authorization is terminated or revoked.  Performed at [facility], [place] [address] [place], [place] [address]   Culture, Urine     Status: Abnormal   Collection Time: 05/12/20  4:04 PM   Specimen: Urine, Clean Catch  Result Value Ref Range Status   Specimen Description URINE, CLEAN CATCH  Final   Special Requests   Final    NONE Performed at [facility], [place] [address], [place], [place] [address]    Culture 30,000 COLONIES/mL KLEBSIELLA PNEUMONIAE (A)  Final   Report Status 05/15/2020 FINAL  Final   Organism ID, Bacteria KLEBSIELLA PNEUMONIAE (A)  Final      Susceptibility   Klebsiella pneumoniae - MIC*    AMPICILLIN RESISTANT Resistant     CEFAZOLIN <=4 SENSITIVE Sensitive     CEFEPIME <=0.12 SENSITIVE Sensitive     CEFTRIAXONE <=0.25 SENSITIVE Sensitive     CIPROFLOXACIN <=0.25 SENSITIVE Sensitive     GENTAMICIN <=1 SENSITIVE Sensitive     IMIPENEM <=0.25  SENSITIVE Sensitive     NITROFURANTOIN <=16 SENSITIVE Sensitive     TRIMETH/SULFA <=20 SENSITIVE Sensitive     AMPICILLIN/SULBACTAM 4 SENSITIVE Sensitive     PIP/TAZO <=4 SENSITIVE Sensitive     * 30,000 COLONIES/mL KLEBSIELLA PNEUMONIAE  SARS CORONAVIRUS 2 (TAT 6-24 HRS) Nasopharyngeal Nasopharyngeal Swab     Status: None   Collection Time: 05/15/20  5:56 PM   Specimen: Nasopharyngeal Swab  Result Value Ref Range Status   SARS Coronavirus 2 NEGATIVE NEGATIVE Final    Comment: (NOTE) SARS-CoV-2 target nucleic acids are  NOT DETECTED.  The SARS-CoV-2 RNA is generally detectable in upper and lower respiratory specimens during the acute phase of infection. Negative results do not preclude SARS-CoV-2 infection, do not rule out co-infections with other pathogens, and should not be used as the sole basis for treatment or other patient management decisions. Negative results must be combined with clinical observations, patient history, and epidemiological information. The expected result is Negative.  Fact Sheet for Patients: SugarRoll.be  Fact Sheet for Healthcare Providers: https://www.woods-mathews.com/  This test is not yet approved or cleared by the [place] FDA and  has been authorized for detection and/or diagnosis of SARS-CoV-2 by FDA under an Emergency Use Authorization (EUA). This EUA will remain  in effect (meaning this test can be used) for the duration of the COVID-19 declaration under Se ction 564(b)(1) of the Act, 21 U.S.C. section 360bbb-3(b)(1), unless the authorization is terminated or revoked sooner.  Performed at [facility], [place] [address], [place], [place] [address]      Labs: BNP (last 3 results) No results for input(s): BNP in the last 8760 hours. Basic Metabolic Panel: Recent Labs  Lab 05/11/20 0406 05/12/20 0142 05/13/20 1018 05/14/20 0548 05/15/20 0201  NA 134* 135 137 137 138  K 3.6 3.3* 3.6  3.9 3.4*  CL 100 102 103 106 107  CO2 22 22 23 22 24   GLUCOSE 116* 84 122* 142* 153*  BUN 80* 82* 76* 76* 77*  CREATININE 3.95* 3.53* 3.10* 3.16* 2.83*  CALCIUM 8.3* 8.1* 8.6* 8.3* 8.4*   Liver Function Tests: No results for input(s): AST, ALT, ALKPHOS, BILITOT, PROT, ALBUMIN in the last 168 hours. No results for input(s): LIPASE, AMYLASE in the last 168 hours. No results for input(s): AMMONIA in the last 168 hours. CBC: Recent Labs  Lab 05/11/20 0406 05/12/20 0142  WBC 10.1 8.5  HGB 8.5* 8.2*  HCT 26.0* 25.3*  MCV 91.9 92.0  PLT 246 258   Cardiac Enzymes: No results for input(s): CKTOTAL, CKMB, CKMBINDEX, TROPONINI in the last 168 hours. BNP: Invalid input(s): POCBNP CBG: Recent Labs  Lab 05/16/20 0717 05/16/20 1145 05/16/20 1722 05/16/20 2106 05/17/20 0613  GLUCAP 173* 162* 153* 164* 155*   D-Dimer No results for input(s): DDIMER in the last 72 hours. Hgb A1c No results for input(s): HGBA1C in the last 72 hours. Lipid Profile No results for input(s): CHOL, HDL, LDLCALC, TRIG, CHOLHDL, LDLDIRECT in the last 72 hours. Thyroid function studies No results for input(s): TSH, T4TOTAL, T3FREE, THYROIDAB in the last 72 hours.  Invalid input(s): FREET3 Anemia work up No results for input(s): VITAMINB12, FOLATE, FERRITIN, TIBC, IRON, RETICCTPCT in the last 72 hours. Urinalysis    Component Value Date/Time   COLORURINE YELLOW 05/11/2020 1754   APPEARANCEUR CLOUDY (A) 05/11/2020 1754   LABSPEC 1.014 05/11/2020 1754   PHURINE 5.0 05/11/2020 1754   GLUCOSEU NEGATIVE 05/11/2020 1754   HGBUR SMALL (A) 05/11/2020 1754   BILIRUBINUR NEGATIVE 05/11/2020 1754   BILIRUBINUR negative 09/10/2019 1647   KETONESUR NEGATIVE 05/11/2020 1754   PROTEINUR NEGATIVE 05/11/2020 1754   UROBILINOGEN 0.2 09/10/2019 1647   UROBILINOGEN 0.2 06/04/2013 1313   NITRITE NEGATIVE 05/11/2020 1754   LEUKOCYTESUR LARGE (A) 05/11/2020 1754   Sepsis Labs Invalid input(s): PROCALCITONIN,  WBC,   LACTICIDVEN Microbiology Recent Results (from the past 240 hour(s))  Resp Panel by RT-PCR (Flu A&B, Covid) Nasopharyngeal Swab     Status: None   Collection Time: 05/10/20  7:25 AM   Specimen: Nasopharyngeal Swab; Nasopharyngeal(NP)  swabs in vial transport medium  Result Value Ref Range Status   SARS Coronavirus 2 by RT PCR NEGATIVE NEGATIVE Final    Comment: (NOTE) SARS-CoV-2 target nucleic acids are NOT DETECTED.  The SARS-CoV-2 RNA is generally detectable in upper respiratory specimens during the acute phase of infection. The lowest concentration of SARS-CoV-2 viral copies this assay can detect is 138 copies/mL. A negative result does not preclude SARS-Cov-2 infection and should not be used as the sole basis for treatment or other patient management decisions. A negative result may occur with  improper specimen collection/handling, submission of specimen other than nasopharyngeal swab, presence of viral mutation(s) within the areas targeted by this assay, and inadequate number of viral copies(<138 copies/mL). A negative result must be combined with clinical observations, patient history, and epidemiological information. The expected result is Negative.  Fact Sheet for Patients:  EntrepreneurPulse.com.au  Fact Sheet for Healthcare Providers:  IncredibleEmployment.be  This test is no t yet approved or cleared by the [place] FDA and  has been authorized for detection and/or diagnosis of SARS-CoV-2 by FDA under an Emergency Use Authorization (EUA). This EUA will remain  in effect (meaning this test can be used) for the duration of the COVID-19 declaration under Section 564(b)(1) of the Act, 21 U.S.C.section 360bbb-3(b)(1), unless the authorization is terminated  or revoked sooner.       Influenza A by PCR NEGATIVE NEGATIVE Final   Influenza B by PCR NEGATIVE NEGATIVE Final    Comment: (NOTE) The Xpert Xpress SARS-CoV-2/FLU/RSV plus  assay is intended as an aid in the diagnosis of influenza from Nasopharyngeal swab specimens and should not be used as a sole basis for treatment. Nasal washings and aspirates are unacceptable for Xpert Xpress SARS-CoV-2/FLU/RSV testing.  Fact Sheet for Patients: EntrepreneurPulse.com.au  Fact Sheet for Healthcare Providers: IncredibleEmployment.be  This test is not yet approved or cleared by the [place] FDA and has been authorized for detection and/or diagnosis of SARS-CoV-2 by FDA under an Emergency Use Authorization (EUA). This EUA will remain in effect (meaning this test can be used) for the duration of the COVID-19 declaration under Section 564(b)(1) of the Act, 21 U.S.C. section 360bbb-3(b)(1), unless the authorization is terminated or revoked.  Performed at [facility], [place] [address], [place], [place] [address]   Culture, Urine     Status: Abnormal   Collection Time: 05/12/20  4:04 PM   Specimen: Urine, Clean Catch  Result Value Ref Range Status   Specimen Description URINE, CLEAN CATCH  Final   Special Requests   Final    NONE Performed at [facility], [place] [address] [place], [place] [address]    Culture 30,000 COLONIES/mL KLEBSIELLA PNEUMONIAE (A)  Final   Report Status 05/15/2020 FINAL  Final   Organism ID, Bacteria KLEBSIELLA PNEUMONIAE (A)  Final      Susceptibility   Klebsiella pneumoniae - MIC*    AMPICILLIN RESISTANT Resistant     CEFAZOLIN <=4 SENSITIVE Sensitive     CEFEPIME <=0.12 SENSITIVE Sensitive     CEFTRIAXONE <=0.25 SENSITIVE Sensitive     CIPROFLOXACIN <=0.25 SENSITIVE Sensitive     GENTAMICIN <=1 SENSITIVE Sensitive     IMIPENEM <=0.25 SENSITIVE Sensitive     NITROFURANTOIN <=16 SENSITIVE Sensitive     TRIMETH/SULFA <=20 SENSITIVE Sensitive     AMPICILLIN/SULBACTAM 4 SENSITIVE Sensitive     PIP/TAZO <=4 SENSITIVE Sensitive     * 30,000 COLONIES/mL KLEBSIELLA PNEUMONIAE  SARS  CORONAVIRUS 2 (TAT  6-24 HRS) Nasopharyngeal Nasopharyngeal Swab     Status: None   Collection Time: 05/15/20  5:56 PM   Specimen: Nasopharyngeal Swab  Result Value Ref Range Status   SARS Coronavirus 2 NEGATIVE NEGATIVE Final    Comment: (NOTE) SARS-CoV-2 target nucleic acids are NOT DETECTED.  The SARS-CoV-2 RNA is generally detectable in upper and lower respiratory specimens during the acute phase of infection. Negative results do not preclude SARS-CoV-2 infection, do not rule out co-infections with other pathogens, and should not be used as the sole basis for treatment or other patient management decisions. Negative results must be combined with clinical observations, patient history, and epidemiological information. The expected result is Negative.  Fact Sheet for Patients: SugarRoll.be  Fact Sheet for Healthcare Providers: https://www.woods-mathews.com/  This test is not yet approved or cleared by the [place] FDA and  has been authorized for detection and/or diagnosis of SARS-CoV-2 by FDA under an Emergency Use Authorization (EUA). This EUA will remain  in effect (meaning this test can be used) for the duration of the COVID-19 declaration under Se ction 564(b)(1) of the Act, 21 U.S.C. section 360bbb-3(b)(1), unless the authorization is terminated or revoked sooner.  Performed at [facility], [place] [address], [place], [place] [address]      Time coordinating discharge: Over 30 minutes  SIGNED:   Charlynne Cousins, MD  [facility] 05/17/2020, 10:39 AM Pager   If 7PM-7AM, please contact night-coverage [email or web address] Password [email or web address]

## 2020-05-18 DIAGNOSIS — E559 Vitamin D deficiency, unspecified: Secondary | ICD-10-CM | POA: Diagnosis not present

## 2020-05-18 DIAGNOSIS — E785 Hyperlipidemia, unspecified: Secondary | ICD-10-CM | POA: Diagnosis not present

## 2020-05-18 DIAGNOSIS — E119 Type 2 diabetes mellitus without complications: Secondary | ICD-10-CM | POA: Diagnosis not present

## 2020-05-18 DIAGNOSIS — H409 Unspecified glaucoma: Secondary | ICD-10-CM | POA: Diagnosis not present

## 2020-05-18 DIAGNOSIS — E1141 Type 2 diabetes mellitus with diabetic mononeuropathy: Secondary | ICD-10-CM | POA: Diagnosis not present

## 2020-05-18 DIAGNOSIS — S42201A Unspecified fracture of upper end of right humerus, initial encounter for closed fracture: Secondary | ICD-10-CM | POA: Diagnosis not present

## 2020-05-18 DIAGNOSIS — Z7401 Bed confinement status: Secondary | ICD-10-CM | POA: Diagnosis not present

## 2020-05-18 DIAGNOSIS — Z794 Long term (current) use of insulin: Secondary | ICD-10-CM | POA: Diagnosis not present

## 2020-05-18 DIAGNOSIS — S42201D Unspecified fracture of upper end of right humerus, subsequent encounter for fracture with routine healing: Secondary | ICD-10-CM | POA: Diagnosis not present

## 2020-05-18 DIAGNOSIS — N184 Chronic kidney disease, stage 4 (severe): Secondary | ICD-10-CM | POA: Diagnosis not present

## 2020-05-18 DIAGNOSIS — Z8673 Personal history of transient ischemic attack (TIA), and cerebral infarction without residual deficits: Secondary | ICD-10-CM | POA: Diagnosis not present

## 2020-05-18 DIAGNOSIS — I639 Cerebral infarction, unspecified: Secondary | ICD-10-CM | POA: Diagnosis not present

## 2020-05-18 DIAGNOSIS — Z66 Do not resuscitate: Secondary | ICD-10-CM | POA: Diagnosis not present

## 2020-05-18 DIAGNOSIS — M2550 Pain in unspecified joint: Secondary | ICD-10-CM | POA: Diagnosis not present

## 2020-05-18 DIAGNOSIS — I10 Essential (primary) hypertension: Secondary | ICD-10-CM | POA: Diagnosis not present

## 2020-05-18 DIAGNOSIS — I129 Hypertensive chronic kidney disease with stage 1 through stage 4 chronic kidney disease, or unspecified chronic kidney disease: Secondary | ICD-10-CM | POA: Diagnosis not present

## 2020-05-18 DIAGNOSIS — N179 Acute kidney failure, unspecified: Secondary | ICD-10-CM | POA: Diagnosis not present

## 2020-05-18 DIAGNOSIS — D649 Anemia, unspecified: Secondary | ICD-10-CM | POA: Diagnosis not present

## 2020-05-18 DIAGNOSIS — S42301D Unspecified fracture of shaft of humerus, right arm, subsequent encounter for fracture with routine healing: Secondary | ICD-10-CM | POA: Diagnosis not present

## 2020-05-18 DIAGNOSIS — G894 Chronic pain syndrome: Secondary | ICD-10-CM | POA: Diagnosis not present

## 2020-05-18 DIAGNOSIS — K5900 Constipation, unspecified: Secondary | ICD-10-CM | POA: Diagnosis not present

## 2020-05-18 DIAGNOSIS — E1122 Type 2 diabetes mellitus with diabetic chronic kidney disease: Secondary | ICD-10-CM | POA: Diagnosis not present

## 2020-05-18 DIAGNOSIS — G8929 Other chronic pain: Secondary | ICD-10-CM | POA: Diagnosis not present

## 2020-05-18 DIAGNOSIS — R627 Adult failure to thrive: Secondary | ICD-10-CM | POA: Diagnosis not present

## 2020-05-18 DIAGNOSIS — N390 Urinary tract infection, site not specified: Secondary | ICD-10-CM | POA: Diagnosis not present

## 2020-05-18 DIAGNOSIS — R5381 Other malaise: Secondary | ICD-10-CM | POA: Diagnosis not present

## 2020-05-18 DIAGNOSIS — Z743 Need for continuous supervision: Secondary | ICD-10-CM | POA: Diagnosis not present

## 2020-05-18 LAB — GLUCOSE, CAPILLARY
Glucose-Capillary: 135 mg/dL — ABNORMAL HIGH (ref 70–99)
Glucose-Capillary: 164 mg/dL — ABNORMAL HIGH (ref 70–99)

## 2020-05-18 NOTE — TOC Transition Note (Signed)
Transition of Care [facility]) - CM/SW Discharge Note   Patient Details  Name: Justin Holloway MRN: [id number] Date of Birth: 1929/12/20  [facility] of [facility] CM/SW Contact:  Sable Feil, LCSW Phone Number: 05/18/2020, 7:09 PM   Clinical Narrative:  Patient discharged today to [facility], transported by non-emergency ambulance transport. Discharge clinicals transmitted to facility and wife completed paperwork at [facility] this morning.      Final next level of care: [facility]) Barriers to Discharge: Barriers Resolved   Patient Goals and CMS Choice Patient states their goals for this hospitalization and ongoing recovery are:: Patient agreeable to ST rehab before returning home CMS Medicare.gov Compare Post Acute Care list provided to:: Patient Choice offered to / list presented to : Patient,Spouse  Discharge Placement PASRR number recieved: 05/12/20            Patient chooses bed at: [facility] Patient to be transferred to facility by: Non-emergency ambulance transport Name of family member notified: Wife Hady Niemczyk Patient and family notified of of transfer: 05/18/20  Discharge Plan and Services In-house Referral: Clinical Social Work                                  Social Determinants of Health ([place]) Interventions  No SDOH interventions requested or needed at discharge.   Readmission Risk Interventions No flowsheet data found.

## 2020-05-18 NOTE — Progress Notes (Signed)
Attempted again to give report x 3.  EMS is here to pick the patient.

## 2020-05-18 NOTE — Discharge Summary (Signed)
Physician Discharge Summary  LAVANCE BEAZER XKG:[id number] DOB: February 21, 1929 DOA: 05/10/2020  PCP: [name], MD  Admit date: 05/10/2020 Discharge date: 05/18/2020  Admitted From: Home Disposition:  SNF  Recommendations for Outpatient Follow-up:  1. Follow up with PCP in 1-2 weeks 2. Please obtain BMP/CBC in one week   Home Health:No Equipment/Devices:None  Discharge Condition:Stable CODE STATUS:Full Diet recommendation: Heart Healthy  Brief/Interim Summary: 87 y.o. male past medical history chronic kidney disease stage IV, diabetes mellitus type 2, essential hypertension, TIA usually uses a walker to ambulate felt and sustained a fracture in the shoulder and humerus was seen in the ED and referred for orthopedic surgery as an outpatient with Dr. Marcelino Scot, he recommended to wear a sling and no operative management.  CT to rule out pathologic fracture.  Since then she has been having difficulty ambulating fell at home on 05/10/2020 labs noted for worsening acute kidney injury x-ray of the left knee noted advanced degenerative changes.  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 No events overnight patient stable for transfer.  Acute kidney injury on chronic kidney disease stage IV: With a baseline creatinine around 3 on admission 4.2 he was started on IV fluids and his creatinine returned to baseline. Patient has refused dialysis in the past he reiterates this.  Diabetes  mellitus type 2 with hyperglycemia: Currently well controlled no change made to his medication.  Chronic pain: Continue Ultram and Lyrica.  Constipation: He was started on laxatives.   Discharge Instructions  Discharge Instructions    Diet - low sodium heart healthy   Complete by: As directed    Diet - low sodium heart healthy   Complete by: As directed    Increase activity slowly   Complete by: As directed    Increase activity slowly   Complete by: As directed      Allergies as of 05/18/2020      Reactions   Gabapentin Itching      Medication List    TAKE these medications   aspirin EC 81 MG tablet Take 81 mg by mouth every evening.   cholecalciferol 1000 units tablet Commonly known as: VITAMIN D Take 2,000 Units by mouth daily.   Combigan 0.2-0.5 % ophthalmic solution Generic drug: brimonidine-timolol Place 1 drop into both eyes every 12 (twelve) hours.   diclofenac Sodium 1 % Gel Commonly known as: VOLTAREN Apply 4 g topically 4 (four) times daily. What changed:   how much to take  when to take this  additional instructions   ferrous sulfate 325 (65 FE) MG tablet Take 325 mg by mouth daily with breakfast.   furosemide 80 MG tablet Commonly known as: LASIX TAKE 1 TABLET BY MOUTH IN  THE MORNING AND ONE-HALF  TABLET BY MOUTH IN THE  EARLY EVENING What changed:   how much to take  how to take this  when to take this  additional instructions   hydrALAZINE 10 MG tablet Commonly known as: APRESOLINE Take 10 mg  by mouth 3 (three) times daily.   insulin degludec 200 UNIT/ML FlexTouch Pen Commonly known as: TRESIBA Inject 23 Units into the skin at bedtime.   Lumigan 0.01 % Soln Generic drug: bimatoprost Place 1 drop into both eyes 2 (two) times daily.   multivitamin with minerals Tabs tablet Take 1 tablet by mouth daily.   ONE TOUCH ULTRA 2 w/Device Kit Use as directed to check blood sugars 2 times per day dx: J49.70   OneTouch Delica  Plus [id number] Misc 100 each by Does not apply route in the morning, at noon, and at bedtime.   OneTouch Ultra test strip Generic drug: glucose blood USE STRIPS TO TEST BLOOD  SUGAR 3 TIMES DAILY 1 STRIP PER TEST   Ozempic (0.25 or 0.5 MG/DOSE) 2 MG/1.5ML Sopn Generic drug: Semaglutide(0.25 or 0.5MG/DOS) Inject 0.5 mg into the skin once a week. What changed: when to take this   polyethylene glycol powder 17 GM/SCOOP powder Commonly known as: GLYCOLAX/MIRALAX Take 17 g by mouth daily as needed for mild constipation.   pravastatin 40 MG tablet Commonly known as: PRAVACHOL Take 1 tablet (40 mg total) by mouth daily. What changed: when to take this   pregabalin 25 MG capsule Commonly known as: LYRICA Take 1 capsule (25 mg total) by mouth at bedtime. What changed: See the new instructions.   tamsulosin 0.4 MG Caps capsule Commonly known as: FLOMAX Take 0.4 mg by mouth daily.   traMADol 50 MG tablet Commonly known as: ULTRAM Take 1 tablet (50 mg total) by mouth every 6 (six) hours as needed for moderate pain.   vitamin C 1000 MG tablet Take 1,000 mg by mouth daily.       Contact information for follow-up providers    Rosita Fire, MD Follow up in 3 week(s).   Specialties: Nephrology, Internal Medicine Why: We will call with appt details Contact information: [place] [address] [phone]            Contact information for after-discharge care    Destination    [facility] .   Service: Skilled Nursing Contact information: [address] [address] [phone]                 Allergies  Allergen Reactions  . Gabapentin Itching    Consultations:  None   Procedures/Studies: DG Shoulder Right  Result Date: 05/04/2020 CLINICAL DATA:  Right shoulder pain and limited range of motion since the patient fell out of bed this morning. EXAM: RIGHT SHOULDER - 2+ VIEW COMPARISON:   None. FINDINGS: The patient has a mildly impacted surgical neck fracture of the humerus. The humeral head and neck have a mottled appearance. The acromioclavicular joint is intact with mild degenerative change present. Soft tissues are unremarkable. IMPRESSION: Mildly impacted surgical neck fracture of the humerus. Mild appearance of the humeral head could be due to osteopenia but underlying tumor is possible. Electronically Signed   By: Inge Rise M.D.   On: 05/04/2020 15:39   CT HUMERUS RIGHT WO CONTRAST  Result Date: 05/11/2020 CLINICAL DATA:  Humeral fracture EXAM: CT OF THE RIGHT HUMERUS WITHOUT CONTRAST TECHNIQUE: Multidetector CT imaging was performed according to the standard protocol. Multiplanar CT image reconstructions were also generated. COMPARISON:  05/04/2020 radiograph FINDINGS: Bones/Joint/Cartilage Impacted fracture of the surgical neck of the humerus with apex anterior angulation and 2.2 cm of displacement. Mild comminution. A fracture plane extends through the greater tuberosity but is  not displaced and hence does not count as a separate fragment in the Neer classification. Accordingly this is a Neer 2 part fracture. There is a 0.6 by 0.6 cm cortical fragment imbedded along the fracture plane on image 29 of series 6. Glenohumeral spurring.  No other humeral fracture is identified. Ligaments Suboptimally assessed by CT. Muscles and Tendons Indistinctness of tissue planes along the fracture site particularly along the adjacent subscapularis muscle. Soft tissues Linear subsegmental atelectasis or scarring in the left lower lobe. IMPRESSION: 1. Neer 2 part fracture of the right proximal humerus as described above. The fracture plane extending through the greater tuberosity is relatively nondisplaced thus does not count as a separate "part" under the Neer classification. 2. Degenerative glenohumeral spurring. Electronically Signed   By: Van Clines M.D.   On: 05/11/2020 14:25   US  RENAL  Result Date: 05/11/2020 CLINICAL DATA:  Acute renal failure. EXAM: RENAL / URINARY TRACT ULTRASOUND COMPLETE COMPARISON:  CT 10/21/2018. FINDINGS: Right Kidney: Renal measurements: 8.5 x 4.7 x 4.3 cm = volume: 88.7 mL. Cortical thinning. Mild increased echogenicity. No mass or hydronephrosis visualized. Left Kidney: Renal measurements: 9.7 x 5.5 x 4.7 cm = volume: 129.2 mL. Difficult visualized. Mild increased echogenicity. No mass or hydronephrosis visualized. Bladder: Appears normal for degree of bladder distention. Other: None. IMPRESSION: 1. Mild increased echogenicity both kidneys suggesting chronic medical renal disease. Right renal cortical thinning. 2.  No acute abnormality.  No hydronephrosis or bladder distention. Electronically Signed   By: Marcello Moores  Register   On: 05/11/2020 16:28   DG Knee Complete 4 Views Left  Result Date: 05/10/2020 CLINICAL DATA:  Left knee "gave out". EXAM: LEFT KNEE - COMPLETE 4+ VIEW COMPARISON:  None. FINDINGS: No evidence of an acute fracture or dislocation. Marked severity medial and lateral tibiofemoral compartment space narrowing is seen. Marked severity patellofemoral narrowing is also noted. There is a small to moderate sized knee effusion. IMPRESSION: 1. Marked severity degenerative changes without an acute osseous abnormality. 2. Small to moderate sized joint effusion. Electronically Signed   By: Virgina Norfolk M.D.   On: 05/10/2020 03:59     Subjective: No new complaints feels great.  Discharge Exam: Vitals:   05/18/20 0535 05/18/20 0851  BP: 127/67 112/62  Pulse: 83 86  Resp: 17 18  Temp: 97.6 F (36.4 C) 98.2 F (36.8 C)  SpO2: 100% 100%   Vitals:   05/17/20 1723 05/17/20 2131 05/18/20 0535 05/18/20 0851  BP: 126/70 (!) 119/52 127/67 112/62  Pulse: 77 81 83 86  Resp: _0 Temp: 98.2 F (36.8 C) 98 F (36.7 C) 97.6 F (36.4 C) 98.2 F (36.8 C)  TempSrc:  Oral Oral   SpO2: 100% 100% 100% 100%  Weight:        General:  Pt is alert, awake, not in acute distress Cardiovascular: RRR, S1/S2 +, no rubs, no gallops Respiratory: CTA bilaterally, no wheezing, no rhonchi Abdominal: Soft, NT, ND, bowel sounds + Extremities: no edema, no cyanosis    The results of significant diagnostics from this hospitalization (including imaging, microbiology, ancillary and laboratory) are listed below for reference.     Microbiology: Recent Results (from the past 240 hour(s))  Resp Panel by RT-PCR (Flu A&B, Covid) Nasopharyngeal Swab     Status: None   Collection Time: 05/10/20  7:25 AM   Specimen: Nasopharyngeal Swab; Nasopharyngeal(NP) swabs in vial transport medium  Result Value Ref Range Status   SARS Coronavirus 2 by RT PCR  NEGATIVE NEGATIVE Final    Comment: (NOTE) SARS-CoV-2 target nucleic acids are NOT DETECTED.  The SARS-CoV-2 RNA is generally detectable in upper respiratory specimens during the acute phase of infection. The lowest concentration of SARS-CoV-2 viral copies this assay can detect is 138 copies/mL. A negative result does not preclude SARS-Cov-2 infection and should not be used as the sole basis for treatment or other patient management decisions. A negative result may occur with  improper specimen collection/handling, submission of specimen other than nasopharyngeal swab, presence of viral mutation(s) within the areas targeted by this assay, and inadequate number of viral copies(<138 copies/mL). A negative result must be combined with clinical observations, patient history, and epidemiological information. The expected result is Negative.  Fact Sheet for Patients:  EntrepreneurPulse.com.au  Fact Sheet for Healthcare Providers:  IncredibleEmployment.be  This test is no t yet approved or cleared by the [place] FDA and  has been authorized for detection and/or diagnosis of SARS-CoV-2 by FDA under an Emergency Use Authorization (EUA). This EUA will remain   in effect (meaning this test can be used) for the duration of the COVID-19 declaration under Section 564(b)(1) of the Act, 21 U.S.C.section 360bbb-3(b)(1), unless the authorization is terminated  or revoked sooner.       Influenza A by PCR NEGATIVE NEGATIVE Final   Influenza B by PCR NEGATIVE NEGATIVE Final    Comment: (NOTE) The Xpert Xpress SARS-CoV-2/FLU/RSV plus assay is intended as an aid in the diagnosis of influenza from Nasopharyngeal swab specimens and should not be used as a sole basis for treatment. Nasal washings and aspirates are unacceptable for Xpert Xpress SARS-CoV-2/FLU/RSV testing.  Fact Sheet for Patients: EntrepreneurPulse.com.au  Fact Sheet for Healthcare Providers: IncredibleEmployment.be  This test is not yet approved or cleared by the [place] FDA and has been authorized for detection and/or diagnosis of SARS-CoV-2 by FDA under an Emergency Use Authorization (EUA). This EUA will remain in effect (meaning this test can be used) for the duration of the COVID-19 declaration under Section 564(b)(1) of the Act, 21 U.S.C. section 360bbb-3(b)(1), unless the authorization is terminated or revoked.  Performed at [facility], [place] [address], [place], [place] [address]   Culture, Urine     Status: Abnormal   Collection Time: 05/12/20  4:04 PM   Specimen: Urine, Clean Catch  Result Value Ref Range Status   Specimen Description URINE, CLEAN CATCH  Final   Special Requests   Final    NONE Performed at [facility], [place] [address], [place], [place] [address]    Culture 30,000 COLONIES/mL KLEBSIELLA PNEUMONIAE (A)  Final   Report Status 05/15/2020 FINAL  Final   Organism ID, Bacteria KLEBSIELLA PNEUMONIAE (A)  Final      Susceptibility   Klebsiella pneumoniae - MIC*    AMPICILLIN RESISTANT Resistant     CEFAZOLIN <=4 SENSITIVE Sensitive     CEFEPIME <=0.12 SENSITIVE Sensitive     CEFTRIAXONE <=0.25  SENSITIVE Sensitive     CIPROFLOXACIN <=0.25 SENSITIVE Sensitive     GENTAMICIN <=1 SENSITIVE Sensitive     IMIPENEM <=0.25 SENSITIVE Sensitive     NITROFURANTOIN <=16 SENSITIVE Sensitive     TRIMETH/SULFA <=20 SENSITIVE Sensitive     AMPICILLIN/SULBACTAM 4 SENSITIVE Sensitive     PIP/TAZO <=4 SENSITIVE Sensitive     * 30,000 COLONIES/mL KLEBSIELLA PNEUMONIAE  SARS CORONAVIRUS 2 (TAT 6-24 HRS) Nasopharyngeal Nasopharyngeal Swab     Status: None   Collection Time: 05/15/20  5:56 PM  Specimen: Nasopharyngeal Swab  Result Value Ref Range Status   SARS Coronavirus 2 NEGATIVE NEGATIVE Final    Comment: (NOTE) SARS-CoV-2 target nucleic acids are NOT DETECTED.  The SARS-CoV-2 RNA is generally detectable in upper and lower respiratory specimens during the acute phase of infection. Negative results do not preclude SARS-CoV-2 infection, do not rule out co-infections with other pathogens, and should not be used as the sole basis for treatment or other patient management decisions. Negative results must be combined with clinical observations, patient history, and epidemiological information. The expected result is Negative.  Fact Sheet for Patients: SugarRoll.be  Fact Sheet for Healthcare Providers: https://www.woods-mathews.com/  This test is not yet approved or cleared by the [place] FDA and  has been authorized for detection and/or diagnosis of SARS-CoV-2 by FDA under an Emergency Use Authorization (EUA). This EUA will remain  in effect (meaning this test can be used) for the duration of the COVID-19 declaration under Se ction 564(b)(1) of the Act, 21 U.S.C. section 360bbb-3(b)(1), unless the authorization is terminated or revoked sooner.  Performed at [facility], [place] [address], [place], [place] [address]   Resp Panel by RT-PCR (Flu A&B, Covid) Nasopharyngeal Swab     Status: None   Collection Time: 05/17/20  5:23 PM    Specimen: Nasopharyngeal Swab; Nasopharyngeal(NP) swabs in vial transport medium  Result Value Ref Range Status   SARS Coronavirus 2 by RT PCR NEGATIVE NEGATIVE Final    Comment: (NOTE) SARS-CoV-2 target nucleic acids are NOT DETECTED.  The SARS-CoV-2 RNA is generally detectable in upper respiratory specimens during the acute phase of infection. The lowest concentration of SARS-CoV-2 viral copies this assay can detect is 138 copies/mL. A negative result does not preclude SARS-Cov-2 infection and should not be used as the sole basis for treatment or other patient management decisions. A negative result may occur with  improper specimen collection/handling, submission of specimen other than nasopharyngeal swab, presence of viral mutation(s) within the areas targeted by this assay, and inadequate number of viral copies(<138 copies/mL). A negative result must be combined with clinical observations, patient history, and epidemiological information. The expected result is Negative.  Fact Sheet for Patients:  EntrepreneurPulse.com.au  Fact Sheet for Healthcare Providers:  IncredibleEmployment.be  This test is no t yet approved or cleared by the [place] FDA and  has been authorized for detection and/or diagnosis of SARS-CoV-2 by FDA under an Emergency Use Authorization (EUA). This EUA will remain  in effect (meaning this test can be used) for the duration of the COVID-19 declaration under Section 564(b)(1) of the Act, 21 U.S.C.section 360bbb-3(b)(1), unless the authorization is terminated  or revoked sooner.       Influenza A by PCR NEGATIVE NEGATIVE Final   Influenza B by PCR NEGATIVE NEGATIVE Final    Comment: (NOTE) The Xpert Xpress SARS-CoV-2/FLU/RSV plus assay is intended as an aid in the diagnosis of influenza from Nasopharyngeal swab specimens and should not be used as a sole basis for treatment. Nasal washings and aspirates are  unacceptable for Xpert Xpress SARS-CoV-2/FLU/RSV testing.  Fact Sheet for Patients: EntrepreneurPulse.com.au  Fact Sheet for Healthcare Providers: IncredibleEmployment.be  This test is not yet approved or cleared by the [place] FDA and has been authorized for detection and/or diagnosis of SARS-CoV-2 by FDA under an Emergency Use Authorization (EUA). This EUA will remain in effect (meaning this test can be used) for the duration of the COVID-19 declaration under Section 564(b)(1) of the Act, 21 U.S.C.  section 360bbb-3(b)(1), unless the authorization is terminated or revoked.  Performed at [facility], [place] [address] [place], [place] [address]      Labs: BNP (last 3 results) No results for input(s): BNP in the last 8760 hours. Basic Metabolic Panel: Recent Labs  Lab 05/12/20 0142 05/13/20 1018 05/14/20 0548 05/15/20 0201  NA 135 137 137 138  K 3.3* 3.6 3.9 3.4*  CL 102 103 106 107  CO2 _0 GLUCOSE 84 122* 142* 153*  BUN 82* 76* 76* 77*  CREATININE 3.53* 3.10* 3.16* 2.83*  CALCIUM 8.1* 8.6* 8.3* 8.4*   Liver Function Tests: No results for input(s): AST, ALT, ALKPHOS, BILITOT, PROT, ALBUMIN in the last 168 hours. No results for input(s): LIPASE, AMYLASE in the last 168 hours. No results for input(s): AMMONIA in the last 168 hours. CBC: Recent Labs  Lab 05/12/20 0142  WBC 8.5  HGB 8.2*  HCT 25.3*  MCV 92.0  PLT 258   Cardiac Enzymes: No results for input(s): CKTOTAL, CKMB, CKMBINDEX, TROPONINI in the last 168 hours. BNP: Invalid input(s): POCBNP CBG: Recent Labs  Lab 05/17/20 0613 05/17/20 1149 05/17/20 1632 05/17/20 2128 05/18/20 0700  GLUCAP 155* 157* 111* 135* 135*   D-Dimer No results for input(s): DDIMER in the last 72 hours. Hgb A1c No results for input(s): HGBA1C in the last 72 hours. Lipid Profile No results for input(s): CHOL, HDL, LDLCALC, TRIG, CHOLHDL, LDLDIRECT in the last 72  hours. Thyroid function studies No results for input(s): TSH, T4TOTAL, T3FREE, THYROIDAB in the last 72 hours.  Invalid input(s): FREET3 Anemia work up No results for input(s): VITAMINB12, FOLATE, FERRITIN, TIBC, IRON, RETICCTPCT in the last 72 hours. Urinalysis    Component Value Date/Time   COLORURINE YELLOW 05/11/2020 1754   APPEARANCEUR CLOUDY (A) 05/11/2020 1754   LABSPEC 1.014 05/11/2020 1754   PHURINE 5.0 05/11/2020 1754   GLUCOSEU NEGATIVE 05/11/2020 1754   HGBUR SMALL (A) 05/11/2020 1754   BILIRUBINUR NEGATIVE 05/11/2020 1754   BILIRUBINUR negative 09/10/2019 1647   KETONESUR NEGATIVE 05/11/2020 1754   PROTEINUR NEGATIVE 05/11/2020 1754   UROBILINOGEN 0.2 09/10/2019 1647   UROBILINOGEN 0.2 06/04/2013 1313   NITRITE NEGATIVE 05/11/2020 1754   LEUKOCYTESUR LARGE (A) 05/11/2020 1754   Sepsis Labs Invalid input(s): PROCALCITONIN,  WBC,  LACTICIDVEN Microbiology Recent Results (from the past 240 hour(s))  Resp Panel by RT-PCR (Flu A&B, Covid) Nasopharyngeal Swab     Status: None   Collection Time: 05/10/20  7:25 AM   Specimen: Nasopharyngeal Swab; Nasopharyngeal(NP) swabs in vial transport medium  Result Value Ref Range Status   SARS Coronavirus 2 by RT PCR NEGATIVE NEGATIVE Final    Comment: (NOTE) SARS-CoV-2 target nucleic acids are NOT DETECTED.  The SARS-CoV-2 RNA is generally detectable in upper respiratory specimens during the acute phase of infection. The lowest concentration of SARS-CoV-2 viral copies this assay can detect is 138 copies/mL. A negative result does not preclude SARS-Cov-2 infection and should not be used as the sole basis for treatment or other patient management decisions. A negative result may occur with  improper specimen collection/handling, submission of specimen other than nasopharyngeal swab, presence of viral mutation(s) within the areas targeted by this assay, and inadequate number of viral copies(<138 copies/mL). A negative result  must be combined with clinical observations, patient history, and epidemiological information. The expected result is Negative.  Fact Sheet for Patients:  EntrepreneurPulse.com.au  Fact Sheet for Healthcare Providers:  IncredibleEmployment.be  This test is no  t yet approved or cleared by the [place] and  has been authorized for detection and/or diagnosis of SARS-CoV-2 by FDA under an Emergency Use Authorization (EUA). This EUA will remain  in effect (meaning this test can be used) for the duration of the COVID-19 declaration under Section 564(b)(1) of the Act, 21 U.S.C.section 360bbb-3(b)(1), unless the authorization is terminated  or revoked sooner.       Influenza A by PCR NEGATIVE NEGATIVE Final   Influenza B by PCR NEGATIVE NEGATIVE Final    Comment: (NOTE) The Xpert Xpress SARS-CoV-2/FLU/RSV plus assay is intended as an aid in the diagnosis of influenza from Nasopharyngeal swab specimens and should not be used as a sole basis for treatment. Nasal washings and aspirates are unacceptable for Xpert Xpress SARS-CoV-2/FLU/RSV testing.  Fact Sheet for Patients: EntrepreneurPulse.com.au  Fact Sheet for Healthcare Providers: IncredibleEmployment.be  This test is not yet approved or cleared by the [place] FDA and has been authorized for detection and/or diagnosis of SARS-CoV-2 by FDA under an Emergency Use Authorization (EUA). This EUA will remain in effect (meaning this test can be used) for the duration of the COVID-19 declaration under Section 564(b)(1) of the Act, 21 U.S.C. section 360bbb-3(b)(1), unless the authorization is terminated or revoked.  Performed at [facility], [place] [address], [place], [place] [address]   Culture, Urine     Status: Abnormal   Collection Time: 05/12/20  4:04 PM   Specimen: Urine, Clean Catch  Result Value Ref Range Status   Specimen Description  URINE, CLEAN CATCH  Final   Special Requests   Final    NONE Performed at [facility], [place] [address], [place], [place] [address]    Culture 30,000 COLONIES/mL KLEBSIELLA PNEUMONIAE (A)  Final   Report Status 05/15/2020 FINAL  Final   Organism ID, Bacteria KLEBSIELLA PNEUMONIAE (A)  Final      Susceptibility   Klebsiella pneumoniae - MIC*    AMPICILLIN RESISTANT Resistant     CEFAZOLIN <=4 SENSITIVE Sensitive     CEFEPIME <=0.12 SENSITIVE Sensitive     CEFTRIAXONE <=0.25 SENSITIVE Sensitive     CIPROFLOXACIN <=0.25 SENSITIVE Sensitive     GENTAMICIN <=1 SENSITIVE Sensitive     IMIPENEM <=0.25 SENSITIVE Sensitive     NITROFURANTOIN <=16 SENSITIVE Sensitive     TRIMETH/SULFA <=20 SENSITIVE Sensitive     AMPICILLIN/SULBACTAM 4 SENSITIVE Sensitive     PIP/TAZO <=4 SENSITIVE Sensitive     * 30,000 COLONIES/mL KLEBSIELLA PNEUMONIAE  SARS CORONAVIRUS 2 (TAT 6-24 HRS) Nasopharyngeal Nasopharyngeal Swab     Status: None   Collection Time: 05/15/20  5:56 PM   Specimen: Nasopharyngeal Swab  Result Value Ref Range Status   SARS Coronavirus 2 NEGATIVE NEGATIVE Final    Comment: (NOTE) SARS-CoV-2 target nucleic acids are NOT DETECTED.  The SARS-CoV-2 RNA is generally detectable in upper and lower respiratory specimens during the acute phase of infection. Negative results do not preclude SARS-CoV-2 infection, do not rule out co-infections with other pathogens, and should not be used as the sole basis for treatment or other patient management decisions. Negative results must be combined with clinical observations, patient history, and epidemiological information. The expected result is Negative.  Fact Sheet for Patients: SugarRoll.be  Fact Sheet for Healthcare Providers: https://www.woods-mathews.com/  This test is not yet approved or cleared by the [place] FDA and  has been authorized for detection and/or diagnosis of SARS-CoV-2  by FDA under an Emergency Use Authorization (  EUA). This EUA will remain  in effect (meaning this test can be used) for the duration of the COVID-19 declaration under Se ction 564(b)(1) of the Act, 21 U.S.C. section 360bbb-3(b)(1), unless the authorization is terminated or revoked sooner.  Performed at [facility], [place] [address], [place], [place] [address]   Resp Panel by RT-PCR (Flu A&B, Covid) Nasopharyngeal Swab     Status: None   Collection Time: 05/17/20  5:23 PM   Specimen: Nasopharyngeal Swab; Nasopharyngeal(NP) swabs in vial transport medium  Result Value Ref Range Status   SARS Coronavirus 2 by RT PCR NEGATIVE NEGATIVE Final    Comment: (NOTE) SARS-CoV-2 target nucleic acids are NOT DETECTED.  The SARS-CoV-2 RNA is generally detectable in upper respiratory specimens during the acute phase of infection. The lowest concentration of SARS-CoV-2 viral copies this assay can detect is 138 copies/mL. A negative result does not preclude SARS-Cov-2 infection and should not be used as the sole basis for treatment or other patient management decisions. A negative result may occur with  improper specimen collection/handling, submission of specimen other than nasopharyngeal swab, presence of viral mutation(s) within the areas targeted by this assay, and inadequate number of viral copies(<138 copies/mL). A negative result must be combined with clinical observations, patient history, and epidemiological information. The expected result is Negative.  Fact Sheet for Patients:  EntrepreneurPulse.com.au  Fact Sheet for Healthcare Providers:  IncredibleEmployment.be  This test is no t yet approved or cleared by the [place] FDA and  has been authorized for detection and/or diagnosis of SARS-CoV-2 by FDA under an Emergency Use Authorization (EUA). This EUA will remain  in effect (meaning this test can be used) for the duration of the COVID-19  declaration under Section 564(b)(1) of the Act, 21 U.S.C.section 360bbb-3(b)(1), unless the authorization is terminated  or revoked sooner.       Influenza A by PCR NEGATIVE NEGATIVE Final   Influenza B by PCR NEGATIVE NEGATIVE Final    Comment: (NOTE) The Xpert Xpress SARS-CoV-2/FLU/RSV plus assay is intended as an aid in the diagnosis of influenza from Nasopharyngeal swab specimens and should not be used as a sole basis for treatment. Nasal washings and aspirates are unacceptable for Xpert Xpress SARS-CoV-2/FLU/RSV testing.  Fact Sheet for Patients: EntrepreneurPulse.com.au  Fact Sheet for Healthcare Providers: IncredibleEmployment.be  This test is not yet approved or cleared by the [place] FDA and has been authorized for detection and/or diagnosis of SARS-CoV-2 by FDA under an Emergency Use Authorization (EUA). This EUA will remain in effect (meaning this test can be used) for the duration of the COVID-19 declaration under Section 564(b)(1) of the Act, 21 U.S.C. section 360bbb-3(b)(1), unless the authorization is terminated or revoked.  Performed at [facility], [place] [address] [place], [place] [address]      Time coordinating discharge: Over 30 minutes  SIGNED:   Charlynne Cousins, MD  [facility] 05/18/2020, 11:00 AM Pager   If 7PM-7AM, please contact night-coverage [email or web address] Password [email or web address]

## 2020-05-18 NOTE — Progress Notes (Signed)
Attempted to give report to [facility] x3.  Will continue to try.

## 2020-05-18 NOTE — Progress Notes (Signed)
Occupational Therapy Treatment Patient Details Name: Justin Holloway MRN: [id number] DOB: 1929-12-12 Today's Date: 05/18/2020    History of present illness Pt is an 87 y/o male admitted 05/10/20 after fall at home left leg "gave out on him" (imaging clear). Also fell last week which resulted in Mildly impacted surgical neck fracture of the R humerus (follow up with OP Ortho). PMH includes Arthritis, CKD, Diabetes mellitus without complication, Glaucoma, Hyperlipidemia, HTN, and TIA.   OT comments  Pt progressing towards OT goals this session. Focused education on don/doff of sling and having Pt do return demo for adjustment to keep at optimal position for safety and comfort. Pt min A for ambulation in room with quad cane. Pt incontinent of stool. Pt max A in standing for rear peri care. Pt then min A with quad cane for return to recliner where he engaged in AROM at digits and wrist. OT will continue to follow. POC remains appropriate.    Follow Up Recommendations  SNF    Equipment Recommendations  None recommended by OT    Recommendations for Other Services      Precautions / Restrictions Precautions Precautions: Fall Required Braces or Orthoses: Sling Restrictions Weight Bearing Restrictions: Yes RUE Weight Bearing: Non weight bearing       Mobility Bed Mobility Overal bed mobility: Needs Assistance Bed Mobility: Supine to Sit     Supine to sit: HOB elevated;Min guard Sit to supine: Min assist   General bed mobility comments: Pt in recliner at beginning and end of session    Transfers Overall transfer level: Needs assistance Equipment used: Quad cane Transfers: Sit to/from Stand Sit to Stand: Min assist;Mod assist         General transfer comment: good power up, min A and pt utilizes increased bracing posteriorly from bed to achieve balance, pt with better ability to achieve balance from straight back chair and BSC    Balance Overall balance assessment: Needs  assistance Sitting-balance support: Feet supported Sitting balance-Leahy Scale: Fair Sitting balance - Comments: With challenge (i.e. LAQ), pt with posterior lean. Able to correct with verbal cueing   Standing balance support: No upper extremity supported;During functional activity Standing balance-Leahy Scale: Poor Standing balance comment: Bracing posteriorly against bed for external support, and then requiring outside support from therapist                           ADL either performed or assessed with clinical judgement   ADL Overall ADL's : Needs assistance/impaired     Grooming: Wash/dry hands;Set up;Sitting Grooming Details (indicate cue type and reason): uses LUE                 Toilet Transfer: Minimal assistance;Ambulation (quad cane) Toilet Transfer Details (indicate cue type and reason): ambulation to [facility], incontinent of bowels Toileting- Clothing Manipulation and Hygiene: Maximal assistance;Sit to/from stand Toileting - Clothing Manipulation Details (indicate cue type and reason): Pt able to stand, max A for rear peri care     Functional mobility during ADLs: Minimal assistance;Cueing for safety;Cane General ADL Comments: Pt limited by decreased strength, decreased ability to care for self and decreased activity tolerance. VSS on RA.     Vision       Perception     Praxis      Cognition Arousal/Alertness: Awake/alert Behavior During Therapy: WFL for tasks assessed/performed Overall Cognitive Status: Impaired/Different from baseline Area of Impairment: Safety/judgement  Safety/Judgement: Decreased awareness of deficits     General Comments: pt continues to need multimodal cuing for safe DME usage; reinforcement of sling adjustment        Exercises     Shoulder Instructions       General Comments VSS on RA, pt with incontinence of stool with ambulation    Pertinent Vitals/ Pain       Pain  Assessment: Faces Faces Pain Scale: Hurts little more Pain Location: R shoulder with movement Pain Descriptors / Indicators: Aching;Guarding Pain Intervention(s): Limited activity within patient's tolerance;Monitored during session;Repositioned (readjusted brace)  Home Living                                          Prior Functioning/Environment              Frequency  Min 2X/week        Progress Toward Goals  OT Goals(current goals can now be found in the care plan section)  Progress towards OT goals: Progressing toward goals  Acute Rehab OT Goals Patient Stated Goal: pt family agreeable to rehab OT Goal Formulation: With patient Time For Goal Achievement: 05/25/20 Potential to Achieve Goals: Good  Plan Discharge plan remains appropriate;Frequency remains appropriate    Co-evaluation                 AM-PAC OT "6 Clicks" Daily Activity     Outcome Measure   Help from another person eating meals?: None Help from another person taking care of personal grooming?: A Little Help from another person toileting, which includes using toliet, bedpan, or urinal?: A Lot Help from another person bathing (including washing, rinsing, drying)?: A Lot Help from another person to put on and taking off regular upper body clothing?: A Lot Help from another person to put on and taking off regular lower body clothing?: A Lot 6 Click Score: 15    End of Session Equipment Utilized During Treatment: Gait belt;Rolling walker  OT Visit Diagnosis: Unsteadiness on feet (R26.81);Muscle weakness (generalized) (M62.81);Pain Pain - Right/Left: Right Pain - part of body: Shoulder   Activity Tolerance Patient tolerated treatment well   Patient Left in chair;with call bell/phone within reach;with chair alarm set   Nurse Communication Mobility status;Weight bearing status        Time: 5176-1607 OT Time Calculation (min): 18 min  Charges: OT General Charges $OT  Visit: 1 Visit OT Treatments $Self Care/Home Management : 8-22 mins  Jesse Sans OTR/L Acute Rehabilitation Services Pager: [phone] Office: [place] 05/18/2020, 1:30 PM

## 2020-05-18 NOTE — Progress Notes (Signed)
[name] to be discharged [facility] per MD order. Patient verbalized understanding.  Skin clean, dry and intact without evidence of skin break down, no evidence of skin tears noted. IV catheter discontinued intact. Site without signs and symptoms of complications. Dressing and pressure applied. Pt denies pain at the site currently. No complaints noted.  Patient free of lines, drains, and wounds.   Discharge packet assembled. An After Visit Summary (AVS) was printed and given to the EMS personnel. Patient escorted via stretcher and discharged to [facility] via ambulance. Report called to [place] at accepting facility ; all questions and concerns addressed.   Amaryllis Dyke, RN

## 2020-05-18 NOTE — Progress Notes (Signed)
Physical Therapy Treatment Patient Details Name: Justin Holloway MRN: [id number] DOB: 11-Feb-1929 Today's Date: 05/18/2020    History of Present Illness Pt is an 87 y/o male admitted 05/10/20 after fall at home left leg "gave out on him" (imaging clear). Also fell last week which resulted in Mildly impacted surgical neck fracture of the R humerus (follow up with OP Ortho). PMH includes Arthritis, CKD, Diabetes mellitus without complication, Glaucoma, Hyperlipidemia, HTN, and TIA.    PT Comments    Pt supine in bed on entry, agreeable to working with therapy while waiting for discharge to [facility]. Pt is limited in safe mobility today by weakness, particularly in L knee secondary to OA. Pt is min A for bed mobility, mod to min A for sit to stand transfers from bed, straight back chair and BSC. Pt min-modA for ambulation with quad cane with 1x LoB requiring max A due to L knee buckling. OT provided straight back chair for seated rest break. When pt started ambulation again, became incontinent of stool. BSC provided. Pt able to ambulate back to recliner with min A and increased cuing for quad cane use. D/c plans remain appropriate at this time. PT will continue to follow acutely.   Follow Up Recommendations  SNF     Equipment Recommendations  3in1 (PT);Wheelchair (measurements PT);Wheelchair cushion (measurements PT)       Precautions / Restrictions Precautions Precautions: Fall Required Braces or Orthoses: Sling Restrictions Weight Bearing Restrictions: Yes RUE Weight Bearing: Non weight bearing    Mobility  Bed Mobility Overal bed mobility: Needs Assistance Bed Mobility: Supine to Sit     Supine to sit: HOB elevated;Min guard Sit to supine: Min assist   General bed mobility comments: pt able to navigate LE to EoB requires increased time and effort and posterior support to come to EoB    Transfers Overall transfer level: Needs assistance Equipment used: Quad cane Transfers:  Sit to/from Stand Sit to Stand: Min assist         General transfer comment: good power up, min A and pt utilizes increased bracing posteriorly from bed to achieve balance, pt with better ability to achieve balance from straight back chair and BSC  Ambulation/Gait Ambulation/Gait assistance: Mod assist;Min assist;Max assist   Assistive device: Quad cane Gait Pattern/deviations: Step-to pattern;Decreased step length - right;Decreased step length - left;Trunk flexed;Shuffle Gait velocity: slowed   General Gait Details: min A progressing to modA with distance due muscle weakness, constant cuing for quad cane placement and weight shift to use cane, pt with 1x LoB requiring maxA for steadying due to L knee buckling while OT provided chair for seated rest break, with resumption of ambulation pt became incontinent of stool and BSC provided. with ambulation from [facility] to recliner, provided demonstration and multimodal cues for quad cane use to provide support in case of knee buckling       Balance Overall balance assessment: Needs assistance Sitting-balance support: Feet supported Sitting balance-Leahy Scale: Fair Sitting balance - Comments: With challenge (i.e. LAQ), pt with posterior lean. Able to correct with verbal cueing   Standing balance support: No upper extremity supported;During functional activity Standing balance-Leahy Scale: Poor Standing balance comment: Bracing posteriorly against bed for external support, and then requiring outside support from therapist                            Cognition Arousal/Alertness: Awake/alert Behavior During Therapy: [facility] for tasks assessed/performed Overall Cognitive  Status: Impaired/Different from baseline Area of Impairment: Safety/judgement                         Safety/Judgement: Decreased awareness of deficits     General Comments: pt continues to need multimodal cuing for safe DME usage         General  Comments General comments (skin integrity, edema, etc.): VSS on RA, pt with incontinence of stool with ambulation      Pertinent Vitals/Pain Pain Assessment: Faces Faces Pain Scale: Hurts little more Pain Location: R shoulder with movement Pain Descriptors / Indicators: Aching;Guarding Pain Intervention(s): Limited activity within patient's tolerance;Monitored during session;Repositioned           PT Goals (current goals can now be found in the care plan section) Acute Rehab PT Goals Patient Stated Goal: pt family agreeable to rehab PT Goal Formulation: With patient/family Time For Goal Achievement: 05/24/20 Potential to Achieve Goals: Good Progress towards PT goals: Progressing toward goals    Frequency    Min 3X/week      PT Plan Current plan remains appropriate       AM-PAC PT "6 Clicks" Mobility   Outcome Measure  Help needed turning from your back to your side while in a flat bed without using bedrails?: A Little Help needed moving from lying on your back to sitting on the side of a flat bed without using bedrails?: A Little Help needed moving to and from a bed to a chair (including a wheelchair)?: A Lot Help needed standing up from a chair using your arms (e.g., wheelchair or bedside chair)?: A Lot Help needed to walk in hospital room?: A Lot Help needed climbing 3-5 steps with a railing? : Total 6 Click Score: 13    End of Session Equipment Utilized During Treatment: Gait belt Activity Tolerance: Patient tolerated treatment well Patient left: with call bell/phone within reach;with family/visitor present;in chair;with chair alarm set Nurse Communication: Mobility status PT Visit Diagnosis: Unsteadiness on feet (R26.81);Muscle weakness (generalized) (M62.81);Difficulty in walking, not elsewhere classified (R26.2);History of falling (Z91.81)     Time: 7673-4193 PT Time Calculation (min) (ACUTE ONLY): 22 min  Charges:  $Therapeutic Activity: 8-22 mins                      Adyen Bifulco B. Migdalia Dk PT, DPT Acute Rehabilitation Services Pager [phone] Office [phone]    [place] 05/18/2020, 11:09 AM

## 2020-05-19 DIAGNOSIS — R627 Adult failure to thrive: Secondary | ICD-10-CM | POA: Diagnosis not present

## 2020-05-19 DIAGNOSIS — G8929 Other chronic pain: Secondary | ICD-10-CM | POA: Diagnosis not present

## 2020-05-19 DIAGNOSIS — E785 Hyperlipidemia, unspecified: Secondary | ICD-10-CM | POA: Diagnosis not present

## 2020-05-19 DIAGNOSIS — E1122 Type 2 diabetes mellitus with diabetic chronic kidney disease: Secondary | ICD-10-CM | POA: Diagnosis not present

## 2020-05-19 DIAGNOSIS — I10 Essential (primary) hypertension: Secondary | ICD-10-CM | POA: Diagnosis not present

## 2020-05-19 DIAGNOSIS — Z66 Do not resuscitate: Secondary | ICD-10-CM | POA: Diagnosis not present

## 2020-05-19 DIAGNOSIS — N179 Acute kidney failure, unspecified: Secondary | ICD-10-CM | POA: Diagnosis not present

## 2020-05-20 ENCOUNTER — Other Ambulatory Visit: Payer: Self-pay | Admitting: Orthopedic Surgery

## 2020-05-20 DIAGNOSIS — H409 Unspecified glaucoma: Secondary | ICD-10-CM | POA: Diagnosis not present

## 2020-05-20 DIAGNOSIS — Z8673 Personal history of transient ischemic attack (TIA), and cerebral infarction without residual deficits: Secondary | ICD-10-CM | POA: Diagnosis not present

## 2020-05-20 DIAGNOSIS — R627 Adult failure to thrive: Secondary | ICD-10-CM | POA: Diagnosis not present

## 2020-05-20 DIAGNOSIS — G894 Chronic pain syndrome: Secondary | ICD-10-CM | POA: Diagnosis not present

## 2020-05-20 DIAGNOSIS — E119 Type 2 diabetes mellitus without complications: Secondary | ICD-10-CM | POA: Diagnosis not present

## 2020-05-20 DIAGNOSIS — I10 Essential (primary) hypertension: Secondary | ICD-10-CM | POA: Diagnosis not present

## 2020-05-20 DIAGNOSIS — S42201A Unspecified fracture of upper end of right humerus, initial encounter for closed fracture: Secondary | ICD-10-CM

## 2020-05-20 DIAGNOSIS — D649 Anemia, unspecified: Secondary | ICD-10-CM | POA: Diagnosis not present

## 2020-05-20 DIAGNOSIS — K5900 Constipation, unspecified: Secondary | ICD-10-CM | POA: Diagnosis not present

## 2020-05-20 DIAGNOSIS — E785 Hyperlipidemia, unspecified: Secondary | ICD-10-CM | POA: Diagnosis not present

## 2020-05-20 DIAGNOSIS — N184 Chronic kidney disease, stage 4 (severe): Secondary | ICD-10-CM | POA: Diagnosis not present

## 2020-05-21 ENCOUNTER — Telehealth: Payer: Medicare Other

## 2020-05-21 ENCOUNTER — Ambulatory Visit (INDEPENDENT_AMBULATORY_CARE_PROVIDER_SITE_OTHER): Payer: Medicare Other

## 2020-05-21 DIAGNOSIS — Z794 Long term (current) use of insulin: Secondary | ICD-10-CM

## 2020-05-21 DIAGNOSIS — E785 Hyperlipidemia, unspecified: Secondary | ICD-10-CM | POA: Diagnosis not present

## 2020-05-21 DIAGNOSIS — N179 Acute kidney failure, unspecified: Secondary | ICD-10-CM | POA: Diagnosis not present

## 2020-05-21 DIAGNOSIS — I129 Hypertensive chronic kidney disease with stage 1 through stage 4 chronic kidney disease, or unspecified chronic kidney disease: Secondary | ICD-10-CM | POA: Diagnosis not present

## 2020-05-21 DIAGNOSIS — E1141 Type 2 diabetes mellitus with diabetic mononeuropathy: Secondary | ICD-10-CM | POA: Diagnosis not present

## 2020-05-21 DIAGNOSIS — E559 Vitamin D deficiency, unspecified: Secondary | ICD-10-CM

## 2020-05-21 DIAGNOSIS — N184 Chronic kidney disease, stage 4 (severe): Secondary | ICD-10-CM

## 2020-05-21 DIAGNOSIS — E1122 Type 2 diabetes mellitus with diabetic chronic kidney disease: Secondary | ICD-10-CM

## 2020-05-24 ENCOUNTER — Telehealth: Payer: Self-pay

## 2020-05-24 NOTE — Patient Instructions (Signed)
Goals Addressed      Patient Stated   .  Maintain adequate kidney function (pt-stated)   On track     Timeframe:  Long-Range Goal Priority:  High Start Date:  01/15/20                         Expected End Date: 08/04/20   Next Follow up date: 06/04/20  Over the next 180 days, patient will:  . Continue to self administer medications as prescribed . Attend all scheduled provider appointments . Call pharmacy for medication refills . Call provider office for new concerns or questions . Keep all MD f/u appointments with Dr. Carolin Sicks as scheduled  . Adhere to water intake as directed by kidney doctor

## 2020-05-24 NOTE — Telephone Encounter (Signed)
-----   Message from [name], MD sent at 05/22/2020  1:57 PM EDT ----- Please call Justin Holloway to check on her husband. Please ask her to call us immediately when d/c date from SNF is determined. He will need hosp f/u.

## 2020-05-24 NOTE — Telephone Encounter (Signed)
Left a detailed message.

## 2020-05-24 NOTE — Chronic Care Management (AMB) (Addendum)
Chronic Care Management Pharmacy Assistant   Name: Justin Holloway  MRN: [id number] DOB: [birth year]   Reason for Encounter: Medication Review/ Patient assistance    Recent office visits:  05-21-2020 Little, [name], RN (CCM)  Recent consult visits:  None  Hospital visits:  Medication Reconciliation was completed by comparing discharge summary, patient's EMR and Pharmacy list, and upon discussion with patient.  Admitted to the hospital on 05-10-2020 due to acute renal failure . Discharge date was 05-18-2020. Discharged from [facility]?Medications Started at [facility] Discharge:?? Tramadol 36m every 6 hours as needed  Medication Changes at Hospital Discharge: Take Lyrica 247mat bed time  Medications Discontinued at Hospital Discharge: None  Medications that remain the same after Hospital Discharge:??  -All other medications will remain the same.    Medications: Outpatient Encounter Medications as of 05/24/2020  Medication Sig  . Ascorbic Acid (VITAMIN C) 1000 MG tablet Take 1,000 mg by mouth daily.   . Marland Kitchenspirin EC 81 MG tablet Take 81 mg by mouth every evening.  . Blood Glucose Monitoring Suppl (ONE TOUCH ULTRA 2) w/Device KIT Use as directed to check blood sugars 2 times per day dx: e11.22  . cholecalciferol (VITAMIN D) 1000 units tablet Take 2,000 Units by mouth daily.   . COMBIGAN 0.2-0.5 % ophthalmic solution Place 1 drop into both eyes every 12 (twelve) hours.  . diclofenac Sodium (VOLTAREN) 1 % GEL Apply 4 g topically 4 (four) times daily. (Patient taking differently: Apply 2-4 g topically in the morning and at bedtime. shoulder)  . ferrous sulfate 325 (65 FE) MG tablet Take 325 mg by mouth daily with breakfast.  . furosemide (LASIX) 80 MG tablet TAKE 1 TABLET BY MOUTH IN  THE MORNING AND ONE-HALF  TABLET BY MOUTH IN THE  EARLY EVENING (Patient taking differently: Take 40-80 mg by mouth See admin instructions. 80 mg in the morning and 40 mg in the  evening.)  . glucose blood (ONETOUCH ULTRA) test strip USE STRIPS TO TEST BLOOD  SUGAR 3 TIMES DAILY 1 STRIP PER TEST  . hydrALAZINE (APRESOLINE) 10 MG tablet Take 10 mg by mouth 3 (three) times daily.  . insulin degludec (TRESIBA) 200 UNIT/ML FlexTouch Pen Inject 23 Units into the skin at bedtime.   . Lancets (ONETOUCH DELICA PLUS [id number] 100 each by Does not apply route in the morning, at noon, and at bedtime.  . Marland KitchenUMIGAN 0.01 % SOLN Place 1 drop into both eyes 2 (two) times daily.   . Multiple Vitamin (MULTIVITAMIN WITH MINERALS) TABS tablet Take 1 tablet by mouth daily.  . polyethylene glycol powder (GLYCOLAX/MIRALAX) 17 GM/SCOOP powder Take 17 g by mouth daily as needed for mild constipation.  . pravastatin (PRAVACHOL) 40 MG tablet Take 1 tablet (40 mg total) by mouth daily. (Patient taking differently: Take 40 mg by mouth at bedtime.)  . pregabalin (LYRICA) 25 MG capsule Take 1 capsule (25 mg total) by mouth at bedtime.  . Semaglutide,0.25 or 0.5MG/DOS, (OZEMPIC, 0.25 OR 0.5 MG/DOSE,) 2 MG/1.5ML SOPN Inject 0.5 mg into the skin once a week. (Patient taking differently: Inject 0.5 mg into the skin every Sunday.)  . tamsulosin (FLOMAX) 0.4 MG CAPS capsule Take 0.4 mg by mouth daily.  . traMADol (ULTRAM) 50 MG tablet Take 1 tablet (50 mg total) by mouth every 6 (six) hours as needed for moderate pain.   No facility-administered encounter medications on file as of 05/24/2020.   NOTES: Spoke with patient's  wife briefly and she voiced that patient is at [facility] skilled nursing facility for rehab and is out of his Ozempic. Patient's wife stated he received ozempic while admitted in the hospital. A prescription was sent in to [facility] on 05-21-2020. I called [employer] to see if patient has a copay and was told it was no pay and that it  is covered during his stay and the rehab facility. Informed patient's wife that he will be receiving his dose at the facility for this week being  that patient's discharge date is 05-31-2020. [place] patient assistance to follow up on patient's Ozempic. Was instructed to tell patient page 3 on application (HIPPA) needs to be signed and dated. Patient's wife stressed the fact that the Ozempic wasn't affordable and she really needs the patient assistance. Told patient the team is working on this process and we will have everything situated Monday so she can sign the page needed. Also told patient's wife for patient to continue taking his other diabetes medications and to monitor blood sugars. Sent message to [employer]. Pattricia Boss will reach out to patient's wife Monday to inform her that application is ready at the office and will refax  application as high priority.   Star Rating Drugs: Pravastatin 72m 05-18-2020 90DS MCataio0.5 Last filled 05-21-2020 [facility] [phone]

## 2020-05-24 NOTE — Chronic Care Management (AMB) (Signed)
Chronic Care Management   CCM RN Visit Note  05/21/2020 Name: Justin Holloway MRN: [id number] DOB: 05-08-1929  Subjective: Justin Holloway is a 87 y.o. year old male who is a primary care patient of [name], MD. The care management team was consulted for assistance with disease management and care coordination needs.    Engaged with patient by telephone for follow up visit in response to provider referral for case management and/or care coordination services.   Consent to Services:  The patient was given information about Chronic Care Management services, agreed to services, and gave verbal consent prior to initiation of services.  Please see initial visit note for detailed documentation.   Patient agreed to services and verbal consent obtained.   Assessment: Review of patient past medical history, allergies, medications, health status, including review of consultants reports, laboratory and other test data, was performed as part of comprehensive evaluation and provision of chronic care management services.   SDOH (Social Determinants of Health) assessments and interventions performed:  Yes, no acute needs   CCM Care Plan  Allergies  Allergen Reactions  . Gabapentin Itching    Outpatient Encounter Medications as of 05/21/2020  Medication Sig  . Ascorbic Acid (VITAMIN C) 1000 MG tablet Take 1,000 mg by mouth daily.   [name] aspirin EC 81 MG tablet Take 81 mg by mouth every evening.  . Blood Glucose Monitoring Suppl (ONE TOUCH ULTRA 2) w/Device KIT Use as directed to check blood sugars 2 times per day dx: e11.22  . cholecalciferol (VITAMIN D) 1000 units tablet Take 2,000 Units by mouth daily.   . COMBIGAN 0.2-0.5 % ophthalmic solution Place 1 drop into both eyes every 12 (twelve) hours.  . diclofenac Sodium (VOLTAREN) 1 % GEL Apply 4 g topically 4 (four) times daily. (Patient taking differently: Apply 2-4 g topically in the morning and at bedtime. shoulder)  . ferrous sulfate  325 (65 FE) MG tablet Take 325 mg by mouth daily with breakfast.  . furosemide (LASIX) 80 MG tablet TAKE 1 TABLET BY MOUTH IN  THE MORNING AND ONE-HALF  TABLET BY MOUTH IN THE  EARLY EVENING (Patient taking differently: Take 40-80 mg by mouth See admin instructions. 80 mg in the morning and 40 mg in the evening.)  . glucose blood (ONETOUCH ULTRA) test strip USE STRIPS TO TEST BLOOD  SUGAR 3 TIMES DAILY 1 STRIP PER TEST  . hydrALAZINE (APRESOLINE) 10 MG tablet Take 10 mg by mouth 3 (three) times daily.  . insulin degludec (TRESIBA) 200 UNIT/ML FlexTouch Pen Inject 23 Units into the skin at bedtime.   . Lancets (ONETOUCH DELICA PLUS [id number]) MISC 100 each by Does not apply route in the morning, at noon, and at bedtime.  [name] LUMIGAN 0.01 % SOLN Place 1 drop into both eyes 2 (two) times daily.   . Multiple Vitamin (MULTIVITAMIN WITH MINERALS) TABS tablet Take 1 tablet by mouth daily.  . polyethylene glycol powder (GLYCOLAX/MIRALAX) 17 GM/SCOOP powder Take 17 g by mouth daily as needed for mild constipation.  . pravastatin (PRAVACHOL) 40 MG tablet Take 1 tablet (40 mg total) by mouth daily. (Patient taking differently: Take 40 mg by mouth at bedtime.)  . pregabalin (LYRICA) 25 MG capsule Take 1 capsule (25 mg total) by mouth at bedtime.  . Semaglutide,0.25 or 0.5MG/DOS, (OZEMPIC, 0.25 OR 0.5 MG/DOSE,) 2 MG/1.5ML SOPN Inject 0.5 mg into the skin once a week. (Patient taking differently: Inject 0.5 mg into the skin every Sunday.)  . tamsulosin (  FLOMAX) 0.4 MG CAPS capsule Take 0.4 mg by mouth daily.  . traMADol (ULTRAM) 50 MG tablet Take 1 tablet (50 mg total) by mouth every 6 (six) hours as needed for moderate pain.   No facility-administered encounter medications on file as of 05/21/2020.    Patient Active Problem List   Diagnosis Date Noted  . Generalized weakness 05/10/2020  . Failure to thrive in adult 05/10/2020  . Chronic pain 05/10/2020  . Constipation 05/10/2020  . Class 1 drug-induced  obesity with body mass index (BMI) of 34.0 to 34.9 in adult 05/10/2020  . DNR (do not resuscitate) 05/10/2020  . Hypertension   . Hyperlipidemia   . Glaucoma   . CKD (chronic kidney disease), stage IV ([place])   . Secondary renal hyperparathyroidism ([place]) 01/14/2020  . Type 2 diabetes mellitus with stage 4 chronic kidney disease, with long-term current use of insulin ([place]) 09/10/2019  . Peripheral vascular disease, unspecified ([place]) 05/28/2019  . Diabetic neuropathy ([place]) 02/18/2019  . Left upper quadrant pain 09/04/2018  . Acute midline low back pain without sciatica 09/04/2018  . CKD (chronic kidney disease) stage 4, GFR 15-29 ml/min (HCC) 11/21/2017  . Hypertensive nephropathy 11/21/2017  . Right foot pain 11/21/2017  . Vertigo 09/09/2017  . BPPV (benign paroxysmal positional vertigo), right 09/06/2017  . Sensorineural hearing loss (SNHL), bilateral 09/06/2017  . Ataxia 02/06/2017  . UTI (lower urinary tract infection) 08/07/2015  . Dizziness 08/07/2015  . Acute renal failure superimposed on stage 3 chronic kidney disease ([place]) 08/07/2015  . Hyperkalemia 08/07/2015  . Left inguinal hernia 05/15/2013  . Diabetes mellitus without complication ([place]) 39/76/7341  . HLD (hyperlipidemia) 03/08/2006  . HYPERTENSION, BENIGN SYSTEMIC 03/08/2006  . 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 PROTEINURIA 03/08/2006    Conditions to be addressed/monitored:DM, CKD Stage 4, Hypertensive Nephropathy, Diabetic mononeuropathy associated with type 2 Diabetes, Vitamin D deficiency, HLD  Care Plan : General Plan of Care (Adult)  Updates made by Lynne Logan, RN since 05/24/2020 12:00 AM    Problem: Chronic Kidney Disease   Priority: High    Long-Range Goal: Maintain Adequate Kidney Function   Start Date: 01/15/2020  Expected End Date: 07/14/2020  Recent Progress: On track  Priority: High  Note:   Current Barriers:  [name] Knowledge Deficits related to disease process and treatment mangement for renal  insufficiency/CKD . Chronic Disease Management support and education needs related to DM, CKD Stage 4, Hypertensive Nephropathy Nurse Case Manager Clinical Goal(s):  [name]  Over the next 180 days, patient will continue to work with the CCM team and PCP for disease education and support for improved Self Health management of stage 4 CKD CCM RN CM Interventions:  05/21/20 completed inbound call from wife [name]  . Determined patient was recently admitted to [facility] following dehydration with AKI, discussed this event occurred following a recent injury to patient's right shoulder resulting in a fracture . Determined patient was discharged to skilled rehab and is currently receiving treatment at [facility] . Determined the purpose of Mrs. Fretwell call today is to advise Mr. Vandervelden will take his last dose of Ozempic on 05/24/20  . Determined patient receives PAP and will need to pick up his new supply before his next scheduled dose . Collaborated with [name] Pharm D regarding patient's needs for a refill and determined the pharmacist will follow up on this and notify the patient/wife of next steps, Mrs. Yusuf is aware the plan to wait for call back next week  from the embedded pharmacy team regarding next steps for Ozempic pick up . Discussed plans with patient for ongoing care management follow up and provided patient with direct contact information for care management team Patient Goals/ Self Care Activities:  Over the next 180 days, patient will:  . Continue to self administer medications as prescribed . Attend all scheduled provider appointments . Call pharmacy for medication refills . Call provider office for new concerns or questions . Keep all MD f/u appointments with Dr. Carolin Sicks as scheduled  . Adhere to water intake as directed by kidney doctor   Next planned follow up date: 06/04/20    Plan:Telephone follow up appointment with care management team  member scheduled for:  06/04/20  Barb Merino, RN, BSN, CCM Care Management Coordinator [place] [facility]  Direct Phone: [phone]

## 2020-05-25 DIAGNOSIS — G894 Chronic pain syndrome: Secondary | ICD-10-CM | POA: Diagnosis not present

## 2020-05-25 DIAGNOSIS — H409 Unspecified glaucoma: Secondary | ICD-10-CM | POA: Diagnosis not present

## 2020-05-25 DIAGNOSIS — I10 Essential (primary) hypertension: Secondary | ICD-10-CM | POA: Diagnosis not present

## 2020-05-25 DIAGNOSIS — R627 Adult failure to thrive: Secondary | ICD-10-CM | POA: Diagnosis not present

## 2020-05-26 DIAGNOSIS — I10 Essential (primary) hypertension: Secondary | ICD-10-CM | POA: Diagnosis not present

## 2020-05-26 DIAGNOSIS — G894 Chronic pain syndrome: Secondary | ICD-10-CM | POA: Diagnosis not present

## 2020-05-26 DIAGNOSIS — S42301D Unspecified fracture of shaft of humerus, right arm, subsequent encounter for fracture with routine healing: Secondary | ICD-10-CM | POA: Diagnosis not present

## 2020-05-26 DIAGNOSIS — E119 Type 2 diabetes mellitus without complications: Secondary | ICD-10-CM | POA: Diagnosis not present

## 2020-05-27 ENCOUNTER — Encounter: Payer: Self-pay | Admitting: Internal Medicine

## 2020-05-29 DIAGNOSIS — K5900 Constipation, unspecified: Secondary | ICD-10-CM | POA: Diagnosis not present

## 2020-05-29 DIAGNOSIS — R627 Adult failure to thrive: Secondary | ICD-10-CM | POA: Diagnosis not present

## 2020-05-29 DIAGNOSIS — G894 Chronic pain syndrome: Secondary | ICD-10-CM | POA: Diagnosis not present

## 2020-05-29 DIAGNOSIS — H409 Unspecified glaucoma: Secondary | ICD-10-CM | POA: Diagnosis not present

## 2020-06-01 ENCOUNTER — Other Ambulatory Visit: Payer: Medicare Other

## 2020-06-01 ENCOUNTER — Telehealth: Payer: Self-pay

## 2020-06-01 NOTE — Telephone Encounter (Signed)
Transition Care Management Unsuccessful Follow-up Telephone Call  Date of discharge and from where:    Attempts:  First attempt  Reason for unsuccessful TCM follow-up call: no answer

## 2020-06-01 NOTE — Telephone Encounter (Signed)
Mrs zavadil said that the pt's still in a skilled nursing facility.

## 2020-06-02 ENCOUNTER — Ambulatory Visit: Payer: Medicare Other | Admitting: Internal Medicine

## 2020-06-02 ENCOUNTER — Ambulatory Visit: Payer: Medicare Other

## 2020-06-02 DIAGNOSIS — G894 Chronic pain syndrome: Secondary | ICD-10-CM | POA: Diagnosis not present

## 2020-06-02 DIAGNOSIS — S42201D Unspecified fracture of upper end of right humerus, subsequent encounter for fracture with routine healing: Secondary | ICD-10-CM | POA: Diagnosis not present

## 2020-06-02 DIAGNOSIS — R627 Adult failure to thrive: Secondary | ICD-10-CM | POA: Diagnosis not present

## 2020-06-02 DIAGNOSIS — S42301D Unspecified fracture of shaft of humerus, right arm, subsequent encounter for fracture with routine healing: Secondary | ICD-10-CM | POA: Diagnosis not present

## 2020-06-02 DIAGNOSIS — E119 Type 2 diabetes mellitus without complications: Secondary | ICD-10-CM | POA: Diagnosis not present

## 2020-06-02 DIAGNOSIS — N184 Chronic kidney disease, stage 4 (severe): Secondary | ICD-10-CM | POA: Diagnosis not present

## 2020-06-03 DIAGNOSIS — S42201D Unspecified fracture of upper end of right humerus, subsequent encounter for fracture with routine healing: Secondary | ICD-10-CM | POA: Diagnosis not present

## 2020-06-03 DIAGNOSIS — G8929 Other chronic pain: Secondary | ICD-10-CM | POA: Diagnosis not present

## 2020-06-03 DIAGNOSIS — E1122 Type 2 diabetes mellitus with diabetic chronic kidney disease: Secondary | ICD-10-CM | POA: Diagnosis not present

## 2020-06-03 DIAGNOSIS — I639 Cerebral infarction, unspecified: Secondary | ICD-10-CM | POA: Diagnosis not present

## 2020-06-04 ENCOUNTER — Telehealth: Payer: Medicare Other

## 2020-06-04 ENCOUNTER — Ambulatory Visit: Payer: Self-pay

## 2020-06-04 DIAGNOSIS — N179 Acute kidney failure, unspecified: Secondary | ICD-10-CM | POA: Diagnosis not present

## 2020-06-04 DIAGNOSIS — N184 Chronic kidney disease, stage 4 (severe): Secondary | ICD-10-CM | POA: Diagnosis not present

## 2020-06-04 DIAGNOSIS — Z794 Long term (current) use of insulin: Secondary | ICD-10-CM

## 2020-06-04 DIAGNOSIS — I129 Hypertensive chronic kidney disease with stage 1 through stage 4 chronic kidney disease, or unspecified chronic kidney disease: Secondary | ICD-10-CM

## 2020-06-04 DIAGNOSIS — E785 Hyperlipidemia, unspecified: Secondary | ICD-10-CM | POA: Diagnosis not present

## 2020-06-04 DIAGNOSIS — E1122 Type 2 diabetes mellitus with diabetic chronic kidney disease: Secondary | ICD-10-CM

## 2020-06-04 DIAGNOSIS — S42301D Unspecified fracture of shaft of humerus, right arm, subsequent encounter for fracture with routine healing: Secondary | ICD-10-CM | POA: Diagnosis not present

## 2020-06-04 DIAGNOSIS — N390 Urinary tract infection, site not specified: Secondary | ICD-10-CM | POA: Diagnosis not present

## 2020-06-04 DIAGNOSIS — E1141 Type 2 diabetes mellitus with diabetic mononeuropathy: Secondary | ICD-10-CM

## 2020-06-04 DIAGNOSIS — E559 Vitamin D deficiency, unspecified: Secondary | ICD-10-CM

## 2020-06-04 DIAGNOSIS — G894 Chronic pain syndrome: Secondary | ICD-10-CM | POA: Diagnosis not present

## 2020-06-04 DIAGNOSIS — E119 Type 2 diabetes mellitus without complications: Secondary | ICD-10-CM | POA: Diagnosis not present

## 2020-06-08 ENCOUNTER — Telehealth: Payer: Self-pay

## 2020-06-08 NOTE — Chronic Care Management (AMB) (Signed)
Left patient a voicemail on home and wife's number with an appointment reminder with [name] ,[place] on 06-09-2020 at 8:30. Instructed patient to have all meds/supplements, any logs available for review and if unable to keep appointment to call to reschedule.  [place] Pharmacist Assistant [phone]

## 2020-06-08 NOTE — Telephone Encounter (Signed)
Transition Care Management Follow-up Telephone Call  Date of discharge and from where: 06/04/2020 From [place]  How have you been since you were released from the hospital? Good  Any questions or concerns? Yes, will the office setup physical and occupational therapy.  Items Reviewed:  Did the pt receive and understand the discharge instructions provided? Yes   Medications obtained and verified? Yes   Other? No   Any new allergies since your discharge? No   Dietary orders reviewed? No dietary orders given.  Do you have support at home? Yes   Home Care and Equipment/Supplies: Were home health services ordered? No If so, what is the name of the agency?  Has the agency set up a time to come to the patient's home?  Were any new equipment or medical supplies ordered?  Yes What is the name of the medical supply agency? [facility] Were you able to get the supplies/equipment? Yes Do you have any questions related to the use of the equipment or supplies? No  Functional Questionnaire: (I = Independent and D = Dependent) ADLs: D  Bathing/Dressing- D  Meal Prep- D  Eating- I  Maintaining continence- D  Transferring/Ambulation- D  Managing Meds- I  Follow up appointments reviewed:   PCP Hospital f/u appt confirmed? Yes  Scheduled to see Deatra James, FNP-BC on 06/01 @ 3:30pm  Specialist Hospital f/u appt confirmed? n/a  Are transportation arrangements needed? No   If their condition worsens, is the pt aware to call PCP or go to the Emergency Dept.? Yes  Was the patient provided with contact information for the PCP's office or ED. Yes  Was to pt encouraged to call back with questions or concerns? Yes

## 2020-06-09 ENCOUNTER — Ambulatory Visit (INDEPENDENT_AMBULATORY_CARE_PROVIDER_SITE_OTHER): Payer: Medicare Other | Admitting: Nurse Practitioner

## 2020-06-09 ENCOUNTER — Encounter: Payer: Self-pay | Admitting: Nurse Practitioner

## 2020-06-09 ENCOUNTER — Other Ambulatory Visit: Payer: Self-pay

## 2020-06-09 ENCOUNTER — Ambulatory Visit (INDEPENDENT_AMBULATORY_CARE_PROVIDER_SITE_OTHER): Payer: Medicare Other

## 2020-06-09 VITALS — BP 110/68 | HR 68 | Temp 98.4°F | Ht 65.0 in | Wt 199.0 lb

## 2020-06-09 DIAGNOSIS — E785 Hyperlipidemia, unspecified: Secondary | ICD-10-CM | POA: Diagnosis not present

## 2020-06-09 DIAGNOSIS — N184 Chronic kidney disease, stage 4 (severe): Secondary | ICD-10-CM

## 2020-06-09 DIAGNOSIS — E119 Type 2 diabetes mellitus without complications: Secondary | ICD-10-CM

## 2020-06-09 DIAGNOSIS — E1141 Type 2 diabetes mellitus with diabetic mononeuropathy: Secondary | ICD-10-CM

## 2020-06-09 DIAGNOSIS — R531 Weakness: Secondary | ICD-10-CM | POA: Diagnosis not present

## 2020-06-09 DIAGNOSIS — Z794 Long term (current) use of insulin: Secondary | ICD-10-CM | POA: Diagnosis not present

## 2020-06-09 DIAGNOSIS — E6609 Other obesity due to excess calories: Secondary | ICD-10-CM

## 2020-06-09 DIAGNOSIS — I129 Hypertensive chronic kidney disease with stage 1 through stage 4 chronic kidney disease, or unspecified chronic kidney disease: Secondary | ICD-10-CM | POA: Diagnosis not present

## 2020-06-09 DIAGNOSIS — E1122 Type 2 diabetes mellitus with diabetic chronic kidney disease: Secondary | ICD-10-CM

## 2020-06-09 DIAGNOSIS — R627 Adult failure to thrive: Secondary | ICD-10-CM | POA: Diagnosis not present

## 2020-06-09 DIAGNOSIS — Z6833 Body mass index (BMI) 33.0-33.9, adult: Secondary | ICD-10-CM

## 2020-06-09 NOTE — Progress Notes (Signed)
I,Tianna Badgett,acting as a Education administrator for [employer], NP.,have documented all relevant documentation on the behalf of [employer], NP,as directed by  [name], NP while in the presence of [name], NP.  This visit occurred during the SARS-CoV-2 public health emergency.  Safety protocols were in place, including screening questions prior to the visit, additional usage of staff PPE, and extensive cleaning of exam room while observing appropriate contact time as indicated for disinfecting solutions.  Subjective:     Patient ID: Justin Holloway , male    DOB: 03/10/1929 , 87 y.o.   MRN: [id number]   Chief Complaint  Patient presents with  . Hospitalization Follow-up    HPI  Patient is here for hospital follow up. He was admitted into the hospital after experiencing weakness that caused a fall.  He was at rehab at [place]. He did PT and OT and helped them.  He needs Home health with bathing, feeding, because his wife is also not able to help him. He sees the kidney doctor in July.  Diet: he is trying to eat healthy low care diet.   The son comes in to help. There are 7 of them so they all help out. Wt Readings from Last 3 Encounters: 06/09/20 : 199 lb (90.3 kg) 05/14/20 : 224 lb 10.4 oz (101.9 kg) 05/04/20 : 210 lb (95.3 kg)     Past Medical History:  Diagnosis Date  . Arthritis   . CKD (chronic kidney disease), stage IV ([place])   . Diabetes mellitus without complication ([place])   . Glaucoma    "blurred vision", see Dr Gershon Crane yearly  . Hyperlipidemia   . Hypertension   . TIA (transient ischemic attack)      Family History  Problem Relation Age of Onset  . Cancer Mother 21       breast  . COPD Father 78       Congestive heart failure     Current Outpatient Medications:  .  Ascorbic Acid (VITAMIN C) 1000 MG tablet, Take 1,000 mg by mouth daily. , Disp: , Rfl:  .  aspirin EC 81 MG tablet, Take 81 mg by mouth every evening., Disp: , Rfl:   .  Blood Glucose Monitoring Suppl (ONE TOUCH ULTRA 2) w/Device KIT, Use as directed to check blood sugars 2 times per day dx: e11.22, Disp: 1 kit, Rfl: 0 .  cholecalciferol (VITAMIN D) 1000 units tablet, Take 2,000 Units by mouth daily. , Disp: , Rfl:  .  COMBIGAN 0.2-0.5 % ophthalmic solution, Place 1 drop into both eyes every 12 (twelve) hours., Disp: , Rfl:  .  diclofenac Sodium (VOLTAREN) 1 % GEL, Apply 4 g topically 4 (four) times daily. (Patient taking differently: Apply 2-4 g topically in the morning and at bedtime. shoulder), Disp: 100 g, Rfl: 0 .  ferrous sulfate 325 (65 FE) MG tablet, Take 325 mg by mouth daily with breakfast., Disp: , Rfl:  .  furosemide (LASIX) 80 MG tablet, TAKE 1 TABLET BY MOUTH IN  THE MORNING AND ONE-HALF  TABLET BY MOUTH IN THE  EARLY EVENING (Patient taking differently: Take 40-80 mg by mouth See admin instructions. 80 mg in the morning and 40 mg in the evening.), Disp: 135 tablet, Rfl: 3 .  glucose blood (ONETOUCH ULTRA) test strip, USE STRIPS TO TEST BLOOD  SUGAR 3 TIMES DAILY 1 STRIP PER TEST, Disp: 300 strip, Rfl: 3 .  hydrALAZINE (APRESOLINE) 10 MG tablet, Take 10 mg by mouth 3 (three)  times daily., Disp: , Rfl:  .  insulin degludec (TRESIBA) 200 UNIT/ML FlexTouch Pen, Inject 23 Units into the skin at bedtime. , Disp: , Rfl:  .  Lancets (ONETOUCH DELICA PLUS [id number]) MISC, 100 each by Does not apply route in the morning, at noon, and at bedtime., Disp: 300 each, Rfl: 3 .  LUMIGAN 0.01 % SOLN, Place 1 drop into both eyes 2 (two) times daily. , Disp: , Rfl:  .  Multiple Vitamin (MULTIVITAMIN WITH MINERALS) TABS tablet, Take 1 tablet by mouth daily., Disp: , Rfl:  .  polyethylene glycol powder (GLYCOLAX/MIRALAX) 17 GM/SCOOP powder, Take 17 g by mouth daily as needed for mild constipation., Disp: , Rfl:  .  pravastatin (PRAVACHOL) 40 MG tablet, Take 1 tablet (40 mg total) by mouth daily. (Patient taking differently: Take 40 mg by mouth at bedtime.), Disp: 90  tablet, Rfl: 3 .  pregabalin (LYRICA) 25 MG capsule, Take 1 capsule (25 mg total) by mouth at bedtime., Disp: 3 capsule, Rfl: 0 .  Semaglutide,0.25 or 0.5MG /DOS, (OZEMPIC, 0.25 OR 0.5 MG/DOSE,) 2 MG/1.5ML SOPN, Inject 0.5 mg into the skin once a week. (Patient taking differently: Inject 0.5 mg into the skin every Sunday.), Disp: 1.5 mL, Rfl: 1 .  tamsulosin (FLOMAX) 0.4 MG CAPS capsule, Take 0.4 mg by mouth daily., Disp: , Rfl:    Allergies  Allergen Reactions  . Gabapentin Itching     Review of Systems  Constitutional: Negative.  Negative for chills and fever.  HENT: Negative for rhinorrhea.   Respiratory: Negative for cough, shortness of breath and wheezing.   Cardiovascular: Negative.  Negative for chest pain and palpitations.  Gastrointestinal: Negative.   Endocrine: Negative for polydipsia, polyphagia and polyuria.  Neurological: Positive for weakness. Negative for numbness and headaches.     Today's Vitals   06/09/20 1545  BP: 110/68  Pulse: 68  Temp: 98.4 F (36.9 C)  TempSrc: Oral  Weight: 199 lb (90.3 kg)  Height: 5\' 5"  (1.651 m)   Body mass index is 33.12 kg/m.   Objective:  Physical Exam Constitutional:      Appearance: Normal appearance. He is obese.  HENT:     Head: Normocephalic and atraumatic.  Cardiovascular:     Rate and Rhythm: Normal rate and regular rhythm.     Pulses: Normal pulses.     Heart sounds: Normal heart sounds. No murmur heard.   Pulmonary:     Effort: Pulmonary effort is normal. No respiratory distress.     Breath sounds: Normal breath sounds. No wheezing.  Skin:    General: Skin is warm and dry.     Capillary Refill: Capillary refill takes less than 2 seconds.  Neurological:     Mental Status: He is alert and oriented to person, place, and time.  Psychiatric:        Mood and Affect: Mood normal.        Behavior: Behavior normal.         Assessment And Plan:     1. Generalized weakness -Patient was discharged to rehab  since discharge and received PT  -Refer patient for home health  -BMP/CBC   2. Failure to thrive in adult - Ambulatory referral to [place]  3. Diabetes mellitus without complication ([place]) -Continue meds, stable  --Discussed with patient the importance of glycemic control and long term complications from uncontrolled diabetes. Discussed with the patient the importance of compliance with home glucose monitoring, diet which includes decrease amount of sugary drinks  and foods. Importance of exercise was also discussed with the patient. Importance of eye exams, self foot care and compliance to office visits was also discussed with the patient.  - BMP8+eGFR - Hemoglobin A1c - CBC no Diff  4. CKD (chronic kidney disease) stage 4, GFR 15-29 ml/min (HCC) -BMP/CBC -Follow up with kidney specialist  - Ambulatory referral to [place]  5. Class 1 obesity due to excess calories with serious comorbidity and body mass index (BMI) of 33.0 to 33.9 in adult  Advised patient on a healthy diet including avoiding fast food and red meats. Increase the intake of lean meats including grilled chicken and [place].  Drink a lot of water. Decrease intake of fatty foods. Exercise for 30-45 min. 4-5 a week to decrease the risk of cardiac event.   The patient was encouraged to call or send a message through [place] for any questions or concerns.   Follow up: if symptoms persist or do not get better.    Patient was given opportunity to ask questions. Patient verbalized understanding of the plan and was able to repeat key elements of the plan. All questions were answered to their satisfaction.  Raman Darel Ricketts, DNP   I, Raman Amina Menchaca have reviewed all documentation for this visit. The documentation on 06/09/20 for the exam, diagnosis, procedures, and orders are all accurate and complete.    IF YOU HAVE BEEN REFERRED TO A SPECIALIST, IT MAY TAKE 1-2 WEEKS TO SCHEDULE/PROCESS THE REFERRAL. IF YOU HAVE NOT HEARD FROM  US/SPECIALIST IN TWO WEEKS, PLEASE GIVE [place] A CALL AT [phone].   THE PATIENT IS ENCOURAGED TO PRACTICE SOCIAL DISTANCING DUE TO THE COVID-19 PANDEMIC.

## 2020-06-09 NOTE — Progress Notes (Signed)
Chronic Care Management Pharmacy Note  06/15/2020 Name:  Justin Holloway MRN:  [id number] DOB:  06/07/1929  Subjective: Justin Holloway is an 87 y.o. year old male who is a primary patient of [name], MD.  The CCM team was consulted for assistance with disease management and care coordination needs.   Patient reports at this time his pain is a 7. He went to the orthopedic doctor last Wednesday and was taken out of the brace. He has not heard what the plan will be yet.   Engaged with patient by telephone for follow up visit in response to provider referral for pharmacy case management and/or care coordination services.   Consent to Services:  The patient was given information about Chronic Care Management services, agreed to services, and gave verbal consent prior to initiation of services.  Please see initial visit note for detailed documentation.   Patient Care Team: [name], MD as PCP - General (Internal Medicine) Marylynn Rutger Salton, MD as Consulting Physician (Ophthalmology) Lynne Logan, RN as Case Manager Mayford Knife, [facility] (Pharmacist)  Recent office visits: 04/19/2020 PCP Office Visit:   Recent consult visits: 02/04/2020 Podiatry OV  01/21/2020 - Nephrology [facility] visits: 05/10/2020 Hospital Visit  ED Visits:  05/04/2020 ED fracture visit    Objective:  Lab Results  Component Value Date   CREATININE 3.06 (H) 06/09/2020   BUN 65 (H) 06/09/2020   GFRNONAA 21 (L) 05/15/2020   GFRAA 19 (L) 01/14/2020   NA 138 06/09/2020   K 4.6 06/09/2020   CALCIUM 8.8 06/09/2020   CO2 21 06/09/2020   GLUCOSE 223 (H) 06/09/2020    Lab Results  Component Value Date/Time   HGBA1C 7.3 (H) 06/09/2020 05:22 PM   HGBA1C 6.6 (H) 04/19/2020 11:02 AM   MICROALBUR 10 06/10/2020 12:38 PM   MICROALBUR 80 05/28/2019 12:29 PM    Last diabetic Eye exam: No results found for: HMDIABEYEEXA  Last diabetic Foot exam: No results found for: HMDIABFOOTEX   Lab Results   Component Value Date   CHOL 173 09/10/2019   HDL 57 09/10/2019   LDLCALC 104 (H) 09/10/2019   TRIG 64 09/10/2019   CHOLHDL 3.0 09/10/2019    Hepatic Function Latest Ref Rng & Units 01/14/2020 11/17/2019 09/10/2019  Total Protein 6.0 - 8.5 g/dL 7.2 - 6.9  Albumin 3.6 - 4.6 g/dL 4.4 4.1 4.2  AST 0 - 40 IU/L 18 - 20  ALT 0 - 44 IU/L 8 - 13  Alk Phosphatase 44 - 121 IU/L 92 - 85  Total Bilirubin 0.0 - 1.2 mg/dL 0.4 - 0.3    Lab Results  Component Value Date/Time   TSH 2.620 02/14/2017 10:55 AM    CBC Latest Ref Rng & Units 06/09/2020 05/12/2020 05/11/2020  WBC 3.4 - 10.8 x10E3/uL 7.2 8.5 10.1  Hemoglobin 13.0 - 17.7 g/dL 10.3(L) 8.2(L) 8.5(L)  Hematocrit 37.5 - 51.0 % 31.6(L) 25.3(L) 26.0(L)  Platelets 150 - 450 x10E3/uL 235 258 246    Lab Results  Component Value Date/Time   VD25OH 34.1 09/10/2019 10:29 AM    Clinical ASCVD: No  The ASCVD Risk score Mikey Bussing DC Jr., et al., 2013) failed to calculate for the following reasons:   The 2013 ASCVD risk score is only valid for ages 79 to 64    Depression screen PHQ 2/9 06/09/2020 05/28/2019 04/28/2019  Decreased Interest 0 0 0  Down, Depressed, Hopeless 0 0 0  PHQ - 2 Score 0 0 0  Altered sleeping 0 0 -  Tired, decreased energy 0 0 -  Change in appetite 0 0 -  Feeling bad or failure about yourself  0 0 -  Trouble concentrating 0 0 -  Moving slowly or fidgety/restless 0 0 -  Suicidal thoughts - 0 -  PHQ-9 Score 0 0 -  Difficult doing work/chores - Not difficult at all -     Social History   Tobacco Use  Smoking Status Former Smoker  . Types: Cigars  Smokeless Tobacco Never Used  Tobacco Comment   socially, not long   BP Readings from Last 3 Encounters:  06/09/20 110/68  05/18/20 112/62  05/04/20 131/71   Pulse Readings from Last 3 Encounters:  06/09/20 68  05/18/20 86  05/04/20 89   Wt Readings from Last 3 Encounters:  06/09/20 199 lb (90.3 kg)  05/14/20 224 lb 10.4 oz (101.9 kg)  05/04/20 210 lb (95.3 kg)   BMI  Readings from Last 3 Encounters:  06/09/20 33.12 kg/m  05/14/20 37.38 kg/m  05/04/20 34.95 kg/m    Assessment/Interventions: Review of patient past medical history, allergies, medications, health status, including review of consultants reports, laboratory and other test data, was performed as part of comprehensive evaluation and provision of chronic care management services.   SDOH:  (Social Determinants of Health) assessments and interventions performed: Yes  SDOH Screenings   Alcohol Screen: Not on file  Depression (PHQ2-9): Low Risk   . PHQ-2 Score: 0  Financial Resource Strain: Medium Risk  . Difficulty of Paying Living Expenses: Somewhat hard  Food Insecurity: Not on file  Housing: Not on file  Physical Activity: Not on file  Social Connections: Not on file  Stress: Not on file  Tobacco Use: Medium Risk  . Smoking Tobacco Use: Former Smoker  . Smokeless Tobacco Use: Never Used  Transportation Needs: Not on file    CCM Care Plan  Allergies  Allergen Reactions  . Gabapentin Itching    Medications Reviewed Today    Reviewed by Lynne Logan, RN (Registered Nurse) on 06/10/20 at 1454  Med List Status: <None>  Medication Order Taking? Sig Documenting Provider Last Dose Status Informant  Ascorbic Acid (VITAMIN C) 1000 MG tablet [id number] No Take 1,000 mg by mouth daily.  [provider] Taking Active Self  aspirin EC 81 MG tablet [id number] No Take 81 mg by mouth every evening. [provider] Taking Active Self  Blood Glucose Monitoring Suppl (ONE TOUCH ULTRA 2) w/Device KIT [id number] No Use as directed to check blood sugars 2 times per day dx: e11.22 Minette Brine, FNP Taking Active Self  cholecalciferol (VITAMIN D) 1000 units tablet [id number] No Take 2,000 Units by mouth daily.  [provider] Taking Active Self  COMBIGAN 0.2-0.5 % ophthalmic solution [id number] No Place 1 drop into both eyes every 12 (twelve) hours. [provider]  Taking Active Self  diclofenac Sodium (VOLTAREN) 1 % GEL [id number] No Apply 4 g topically 4 (four) times daily.  Patient taking differently: Apply 2-4 g topically in the morning and at bedtime. shoulder   Deno Etienne, DO Taking Active   ferrous sulfate 325 (65 FE) MG tablet [id number] No Take 325 mg by mouth daily with breakfast. [provider] Taking Active Self  furosemide (LASIX) 80 MG tablet [id number] No TAKE 1 TABLET BY MOUTH IN  THE MORNING AND ONE-HALF  TABLET BY MOUTH IN THE  EARLY EVENING  Patient taking differently: Take 40-80 mg by mouth See admin instructions.  80 mg in the morning and 40 mg in the evening.   [name], MD Taking Active   glucose blood [facility] ULTRA) test strip [id number] No USE STRIPS TO TEST BLOOD  SUGAR 3 TIMES DAILY 1 STRIP PER TEST Minette Brine, FNP Taking Active Self  hydrALAZINE (APRESOLINE) 10 MG tablet [id number] No Take 10 mg by mouth 3 (three) times daily. [provider] Taking Active Self  insulin degludec (TRESIBA) 200 UNIT/ML FlexTouch Pen [id number] No Inject 23 Units into the skin at bedtime.  [provider] Taking Active Self  Lancets Glory Rosebush DELICA PLUS JMEQAS34H) [place] [id number] No 100 each by Does not apply route in the morning, at noon, and at bedtime. Minette Brine, FNP Taking Active Self  LUMIGAN 0.01 % SOLN [id number] No Place 1 drop into both eyes 2 (two) times daily.  [provider] Taking Active Self  Multiple Vitamin (MULTIVITAMIN WITH MINERALS) TABS tablet [id number] No Take 1 tablet by mouth daily. [provider] Taking Active Self  polyethylene glycol powder (GLYCOLAX/MIRALAX) 17 GM/SCOOP powder [id number] No Take 17 g by mouth daily as needed for mild constipation. [provider] Taking Active Self  pravastatin (PRAVACHOL) 40 MG tablet [id number] No Take 1 tablet (40 mg total) by mouth daily.  Patient taking differently: Take 40 mg by mouth at bedtime.   [name], MD Taking  Active   pregabalin (LYRICA) 25 MG capsule [id number] No Take 1 capsule (25 mg total) by mouth at bedtime. Charlynne Cousins, MD Taking Active   Semaglutide,0.25 or 0.5MG/DOS, (OZEMPIC, 0.25 OR 0.5 MG/DOSE,) 2 MG/1.5ML SOPN [id number] No Inject 0.5 mg into the skin once a week.  Patient taking differently: Inject 0.5 mg into the skin every Sunday.   Minette Brine, FNP Taking Active   tamsulosin [facility]) 0.4 MG CAPS capsule [id number] No Take 0.4 mg by mouth daily. [provider] Taking Active Self          Patient Active Problem List   Diagnosis Date Noted  . Generalized weakness 05/10/2020  . Failure to thrive in adult 05/10/2020  . Chronic pain 05/10/2020  . Constipation 05/10/2020  . 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 DNR (do not resuscitate) 05/10/2020  . Hypertension   . Hyperlipidemia   . Glaucoma   . CKD (chronic kidney disease), stage IV ([place])   . Secondary renal hyperparathyroidism ([place]) 01/14/2020  . Type 2 diabetes mellitus with stage 4 chronic kidney disease, with long-term current use of insulin ([place]) 09/10/2019  . Peripheral vascular disease, unspecified ([place]) 05/28/2019  . Diabetic neuropathy ([place]) 02/18/2019  . Left upper quadrant pain 09/04/2018  . Acute midline low back pain without sciatica 09/04/2018  . CKD (chronic kidney disease) stage 4, GFR 15-29 ml/min (HCC) 11/21/2017  . Hypertensive nephropathy 11/21/2017  . Right foot pain 11/21/2017  . Vertigo 09/09/2017  . BPPV (benign paroxysmal positional vertigo), right 09/06/2017  . Sensorineural hearing loss (SNHL), bilateral 09/06/2017  . Ataxia 02/06/2017  . UTI (lower urinary tract infection) 08/07/2015  . Dizziness 08/07/2015  . Acute renal failure superimposed on stage 3 chronic kidney disease ([place]) 08/07/2015  . Hyperkalemia 08/07/2015  . Left inguinal hernia 05/15/2013  . Diabetes mellitus without complication ([place]) 47/65/4650  .  HLD (hyperlipidemia) 03/08/2006  . HYPERTENSION, BENIGN SYSTEMIC 03/08/2006  . 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 PROTEINURIA 03/08/2006    Immunization History  Administered Date(s) Administered  . Fluad Quad(high Dose 65+) 09/23/2019  .  Influenza, High Dose Seasonal PF 11/21/2017, 08/29/2018  . PFIZER(Purple Top)SARS-COV-2 Vaccination 02/01/2019, 03/06/2019, 11/08/2019  . Pneumococcal Polysaccharide-23 12/10/1998    Conditions to be addressed/monitored:  Hypertension and Diabetes  Care Plan : [place]  Updates made by Mayford Knife, RPH since 06/15/2020 12:00 AM    Problem: HTN, DM II, HLD, CKD Stage 4   Priority: High    Long-Range Goal: Disease Management   This Visit's Progress: On track  Priority: High  Note:     Current Barriers:  . Unable to independently afford treatment regimen  Pharmacist Clinical Goal(s):  [name] Patient will achieve adherence to monitoring guidelines and medication adherence to achieve therapeutic efficacy through collaboration with PharmD and provider.   Interventions: . 1:1 collaboration with [name], MD regarding development and update of comprehensive plan of care as evidenced by provider attestation and co-signature . Inter-disciplinary care team collaboration (see longitudinal plan of care) . Comprehensive medication review performed; medication list updated in electronic medical record  Hypertension (BP goal <130/80) -Controlled -Current treatment: . Hydralazine 10 mg taking 1 tablet three times per day.  -Current home readings: patient reports that he does not have a BP monitor. He has never had an issue with his BP. -Current dietary habits: patient reports that he does not use salt.  -Current exercise habits:  -Denies hypotensive/hypertensive symptoms -Educated on Exercise goal of 150 minutes per week; -Counseled to monitor BP at home at least three times per week, document, and provide log at future  appointments -Collaborate with patient to order BP cuff from [facility] care.  -Recommended to continue current medication  Diabetes (A1c goal <7%) -Controlled -Current medications: .  [name] Ozempic 0.5 mg - injecting once a week on Saturday o He has one pin in each box. He reports that he gets three injections out of the box.  - He is going is coming in today to see about his insulin  - He reports his wife has the letter.  -Current home glucose readings . fasting glucose: <130 -Denies hypoglycemic/hyperglycemic symptoms -Current meal patterns:  . breakfast: regular food, bacon, sausage, [place] bacon, eggs, nothing has changed in patients diet. . Lunch-chicken salad sandwich o He does not eat bologna, hot dogs or cheese because of the sodium.    . Dinner: baked chicken, [place] wings,  . drinks: he is drinking plenty of water  -Current exercise: exercise his shoulder the best he could. Patient reports walking everyday on the sidewalk 150 yards.  -Educated on Exercise goal of 150 minutes per week; -Counseled to check feet daily and get yearly eye exams -Counseled on diet and exercise extensively Recommended to continue current medication  CKD Stage 4  -Controlled -Current treatment  . Ferrous Sulfate 325 mg (65 FE)- take 1 tablet by mouth daily . Hydralazine 10 mg tablet three times per day . Pravastatin 40 mg tablet once per day   -Reviewed patients current medications, and at this time none of these medications need to be renally dosed. Will continue to monitor closely.  -Recommend patient continue current medication regimen and close follow up with nephrologist.    Health Maintenance -Vaccine gaps:   -Shingrix Vaccine  - COVID-19: 2ND BOOSTER   Patient Goals/Self-Care Activities . Patient will:  - take medications as prescribed  Follow Up Plan: Telephone follow up appointment with care management team member scheduled for: 07/21/2020 The patient has been provided with  contact information for the care management team and has been advised  to call with any health related questions or concerns.       Medication Assistance: Application for Ozempic  medication assistance program. in process.  Anticipated assistance start date 06/2020.  See plan of care for additional detail.  Compliance/Adherence/Medication fill history: Care Gaps: Shingrix Vaccination Pneumococcal Vaccine   Star-Rating Drugs: Ozempic 0.5 mg inject once a week on Friday   Patient's preferred pharmacy is:  [facility] [place], [place] - [address] [place] [address] [place] [address] Phone: [phone] Fax: [phone] - [address] [place] [address] Phone: [phone] Fax: [phone]  [facility]  ([place], [address], [address] [place], [place] [address] Phone: [phone] Fax: [place], [place] [address] [place] [address] Phone: [phone] Fax: [phone]  Uses pill box? Yes Pt endorses 99% compliance  We discussed: Current pharmacy is preferred with insurance plan and patient is satisfied with pharmacy services Patient decided to: Continue current medication management strategy  Care Plan and Follow Up Patient Decision:  Patient agrees to Care Plan and Follow-up.  Plan: Telephone follow up appointment with care management team member scheduled for:  07/21/2020 and The patient has been provided with contact information for the care management team and has been advised to call with any health related questions or concerns.    [name], PharmD Clinical Pharmacist [facility] [phone]

## 2020-06-10 ENCOUNTER — Ambulatory Visit: Payer: Self-pay

## 2020-06-10 ENCOUNTER — Telehealth: Payer: Medicare Other

## 2020-06-10 ENCOUNTER — Other Ambulatory Visit: Payer: Self-pay

## 2020-06-10 DIAGNOSIS — E119 Type 2 diabetes mellitus without complications: Secondary | ICD-10-CM | POA: Diagnosis not present

## 2020-06-10 DIAGNOSIS — Z794 Long term (current) use of insulin: Secondary | ICD-10-CM | POA: Diagnosis not present

## 2020-06-10 DIAGNOSIS — E785 Hyperlipidemia, unspecified: Secondary | ICD-10-CM | POA: Diagnosis not present

## 2020-06-10 DIAGNOSIS — E559 Vitamin D deficiency, unspecified: Secondary | ICD-10-CM

## 2020-06-10 DIAGNOSIS — N184 Chronic kidney disease, stage 4 (severe): Secondary | ICD-10-CM

## 2020-06-10 DIAGNOSIS — E1141 Type 2 diabetes mellitus with diabetic mononeuropathy: Secondary | ICD-10-CM | POA: Diagnosis not present

## 2020-06-10 DIAGNOSIS — E1122 Type 2 diabetes mellitus with diabetic chronic kidney disease: Secondary | ICD-10-CM | POA: Diagnosis not present

## 2020-06-10 DIAGNOSIS — I129 Hypertensive chronic kidney disease with stage 1 through stage 4 chronic kidney disease, or unspecified chronic kidney disease: Secondary | ICD-10-CM | POA: Diagnosis not present

## 2020-06-10 LAB — BMP8+EGFR
BUN/Creatinine Ratio: 21 (ref 10–24)
BUN: 65 mg/dL — ABNORMAL HIGH (ref 8–27)
CO2: 21 mmol/L (ref 20–29)
Calcium: 8.8 mg/dL (ref 8.6–10.2)
Chloride: 98 mmol/L (ref 96–106)
Creatinine, Ser: 3.06 mg/dL — ABNORMAL HIGH (ref 0.76–1.27)
Glucose: 223 mg/dL — ABNORMAL HIGH (ref 65–99)
Potassium: 4.6 mmol/L (ref 3.5–5.2)
Sodium: 138 mmol/L (ref 134–144)
eGFR: 19 mL/min/{1.73_m2} — ABNORMAL LOW (ref >59)

## 2020-06-10 LAB — POCT UA - MICROALBUMIN
Creatinine, POC: 50 mg/dL
Microalbumin Ur, POC: 10 mg/L

## 2020-06-10 LAB — HEMOGLOBIN A1C
Est. average glucose Bld gHb Est-mCnc: 163 mg/dL
Hgb A1c MFr Bld: 7.3 % — ABNORMAL HIGH (ref 4.8–5.6)

## 2020-06-10 LAB — CBC
Hematocrit: 31.6 % — ABNORMAL LOW (ref 37.5–51.0)
Hemoglobin: 10.3 g/dL — ABNORMAL LOW (ref 13.0–17.7)
MCH: 29.6 pg (ref 26.6–33.0)
MCHC: 32.6 g/dL (ref 31.5–35.7)
MCV: 91 fL (ref 79–97)
Platelets: 235 10*3/uL (ref 150–450)
RBC: 3.48 x10E6/uL — ABNORMAL LOW (ref 4.14–5.80)
RDW: 12.7 % (ref 11.6–15.4)
WBC: 7.2 10*3/uL (ref 3.4–10.8)

## 2020-06-10 MED ORDER — OZEMPIC (0.25 OR 0.5 MG/DOSE) 2 MG/1.5ML ~~LOC~~ SOPN: 0.5000 mg | PEN_INJECTOR | SUBCUTANEOUS | 1 refills | Status: DC

## 2020-06-10 MED ORDER — INSULIN DEGLUDEC 200 UNIT/ML ~~LOC~~ SOPN: 23.0000 [IU] | PEN_INJECTOR | Freq: Every day | SUBCUTANEOUS | 1 refills | Status: DC

## 2020-06-10 NOTE — Chronic Care Management (AMB) (Signed)
Chronic Care Management   CCM RN Visit Note  06/10/2020 Name: ZACHARY NOLE MRN: [id number] DOB: 21-Jul-1929  Subjective: Justin Holloway is a 87 y.o. year old male who is a primary care patient of [name], MD. The care management team was consulted for assistance with disease management and care coordination needs.    Engaged with patient by telephone for follow up visit in response to provider referral for case management and/or care coordination services.   Consent to Services:  The patient was given information about Chronic Care Management services, agreed to services, and gave verbal consent prior to initiation of services.  Please see initial visit note for detailed documentation.   Patient agreed to services and verbal consent obtained.   Assessment: Review of patient past medical history, allergies, medications, health status, including review of consultants reports, laboratory and other test data, was performed as part of comprehensive evaluation and provision of chronic care management services.   SDOH (Social Determinants of Health) assessments and interventions performed: Yes, no acute challenges identified    CCM Care Plan  Allergies  Allergen Reactions  . Gabapentin Itching    Outpatient Encounter Medications as of 06/10/2020  Medication Sig  . Ascorbic Acid (VITAMIN C) 1000 MG tablet Take 1,000 mg by mouth daily.   [name] aspirin EC 81 MG tablet Take 81 mg by mouth every evening.  . Blood Glucose Monitoring Suppl (ONE TOUCH ULTRA 2) w/Device KIT Use as directed to check blood sugars 2 times per day dx: e11.22  . cholecalciferol (VITAMIN D) 1000 units tablet Take 2,000 Units by mouth daily.   . COMBIGAN 0.2-0.5 % ophthalmic solution Place 1 drop into both eyes every 12 (twelve) hours.  . diclofenac Sodium (VOLTAREN) 1 % GEL Apply 4 g topically 4 (four) times daily. (Patient taking differently: Apply 2-4 g topically in the morning and at bedtime. shoulder)  .  ferrous sulfate 325 (65 FE) MG tablet Take 325 mg by mouth daily with breakfast.  . furosemide (LASIX) 80 MG tablet TAKE 1 TABLET BY MOUTH IN  THE MORNING AND ONE-HALF  TABLET BY MOUTH IN THE  EARLY EVENING (Patient taking differently: Take 40-80 mg by mouth See admin instructions. 80 mg in the morning and 40 mg in the evening.)  . glucose blood (ONETOUCH ULTRA) test strip USE STRIPS TO TEST BLOOD  SUGAR 3 TIMES DAILY 1 STRIP PER TEST  . hydrALAZINE (APRESOLINE) 10 MG tablet Take 10 mg by mouth 3 (three) times daily.  . Lancets (ONETOUCH DELICA PLUS [id number]) MISC 100 each by Does not apply route in the morning, at noon, and at bedtime.  [name] LUMIGAN 0.01 % SOLN Place 1 drop into both eyes 2 (two) times daily.   . Multiple Vitamin (MULTIVITAMIN WITH MINERALS) TABS tablet Take 1 tablet by mouth daily.  . polyethylene glycol powder (GLYCOLAX/MIRALAX) 17 GM/SCOOP powder Take 17 g by mouth daily as needed for mild constipation.  . pravastatin (PRAVACHOL) 40 MG tablet Take 1 tablet (40 mg total) by mouth daily. (Patient taking differently: Take 40 mg by mouth at bedtime.)  . pregabalin (LYRICA) 25 MG capsule Take 1 capsule (25 mg total) by mouth at bedtime.  . tamsulosin (FLOMAX) 0.4 MG CAPS capsule Take 0.4 mg by mouth daily.  . [DISCONTINUED] insulin degludec (TRESIBA) 200 UNIT/ML FlexTouch Pen Inject 23 Units into the skin at bedtime.   . [DISCONTINUED] Semaglutide,0.25 or 0.5MG/DOS, (OZEMPIC, 0.25 OR 0.5 MG/DOSE,) 2 MG/1.5ML SOPN Inject 0.5 mg into the  skin once a week. (Patient taking differently: Inject 0.5 mg into the skin every Sunday.)   No facility-administered encounter medications on file as of 06/10/2020.    Patient Active Problem List   Diagnosis Date Noted  . Generalized weakness 05/10/2020  . Failure to thrive in adult 05/10/2020  . Chronic pain 05/10/2020  . Constipation 05/10/2020  . 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 DNR  (do not resuscitate) 05/10/2020  . Hypertension   . Hyperlipidemia   . Glaucoma   . CKD (chronic kidney disease), stage IV ([place])   . Secondary renal hyperparathyroidism ([place]) 01/14/2020  . Type 2 diabetes mellitus with stage 4 chronic kidney disease, with long-term current use of insulin ([place]) 09/10/2019  . Peripheral vascular disease, unspecified ([place]) 05/28/2019  . Diabetic neuropathy ([place]) 02/18/2019  . Left upper quadrant pain 09/04/2018  . Acute midline low back pain without sciatica 09/04/2018  . CKD (chronic kidney disease) stage 4, GFR 15-29 ml/min ([facility]) 11/21/2017  . Hypertensive nephropathy 11/21/2017  . Right foot pain 11/21/2017  . Vertigo 09/09/2017  . BPPV (benign paroxysmal positional vertigo), right 09/06/2017  . Sensorineural hearing loss (SNHL), bilateral 09/06/2017  . Ataxia 02/06/2017  . UTI (lower urinary tract infection) 08/07/2015  . Dizziness 08/07/2015  . Acute renal failure superimposed on stage 3 chronic kidney disease ([place]) 08/07/2015  . Hyperkalemia 08/07/2015  . Left inguinal hernia 05/15/2013  . Diabetes mellitus without complication ([place]) 83/66/2947  . HLD (hyperlipidemia) 03/08/2006  . HYPERTENSION, BENIGN SYSTEMIC 03/08/2006  . 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 PROTEINURIA 03/08/2006    Conditions to be addressed/monitored:DM, CKD Stage 4, Hypertensive Nephropathy, Diabetic mononeuropathy associated with type 2 Diabetes, Vitamin D deficiency, HLD  Care Plan : Diabetes Type 2 (Adult)  Updates made by Lynne Logan, RN since 06/10/2020 12:00 AM    Problem: Glycemic Management (Diabetes, Type 2)   Priority: High    Long-Range Goal: Glycemic Management Optimized   Start Date: 06/10/2020  Expected End Date: 05/10/2021  This Visit's Progress: On track  Priority: High  Note:   Objective:  Lab Results  Component Value Date   HGBA1C 7.3 (H) 06/09/2020 .   Lab Results  Component Value Date   CREATININE 3.06 (H) 06/09/2020   CREATININE 2.83 (H)  05/15/2020   CREATININE 3.16 (H) 05/14/2020 .   Lab Results  Component Value Date   EGFR 19 (L) 06/09/2020 .   Current Barriers:  [name] Knowledge Deficits related to basic Diabetes pathophysiology and self care/management . Knowledge Deficits related to medications used for management of diabetes . Difficulty obtaining or cannot afford medications Case Manager Clinical Goal(s):  . patient will demonstrate improved adherence to prescribed treatment plan for diabetes self care/management as evidenced by: daily monitoring and recording of CBG  adherence to ADA/ carb modified diet exercise 5 days/week adherence to prescribed medication regimen contacting provider for new or worsened symptoms or questions Interventions:  06/10/20 completed successful inbound call from patient  . Collaboration with [name], MD regarding development and update of comprehensive plan of care as evidenced by provider attestation and co-signature . Inter-disciplinary care team collaboration (see longitudinal plan of care) . Provided education to patient about basic DM disease process . Review of patient status, including review of consultants reports, relevant laboratory and other test results, and medications completed. . Reviewed medications with patient and discussed importance of medication adherence . Determined patient is out of his Ozempic,  he has 1/2 of 1 pen left of his [place] . Collaborated with PCP and embedded Pharm D regarding patient's need for refills . Determined patient is awaiting response regarding his re-enrollment for PAP, determined PCP will send Rx to local pharmacy until PAP is approved, she will provide samples if available, patient made aware . Educated patient on dietary and exercise recommendations; daily glycemic control FBS 80-130, <180 after meals;15'15' rule . Discussed plans with patient for ongoing care management follow up and provided patient with direct contact information for care  management team Self-Care Activities Self administers oral medications as prescribed Self administers insulin as prescribed Self administers injectable DM medication (Ozempic) as prescribed Attends all scheduled provider appointments Checks blood sugars as prescribed and utilize hyper and hypoglycemia protocol as needed Adheres to prescribed ADA/carb modified Patient Goals: - check blood sugar at prescribed times - check blood sugar before and after exercise - check blood sugar if I feel it is too high or too low - enter blood sugar readings and medication or insulin into daily log - take the blood sugar log to all doctor visits - take the blood sugar meter to all doctor visits - set a realistic goal - keep appointment with eye doctor - schedule appointment with eye doctor - check feet daily for cuts, sores or redness - do heel pump exercise 2 to 3 times each day - keep feet up while sitting - trim toenails straight across - wash and dry feet carefully every day - wear comfortable, cotton socks - wear comfortable, well-fitting shoes  Follow Up Plan: Telephone follow up appointment with care management team member scheduled for: 07/26/20    Plan:Telephone follow up appointment with care management team member scheduled for:  07/26/20  Barb Merino, RN, BSN, CCM Care Management Coordinator [place] [facility]  Direct Phone: [phone]

## 2020-06-10 NOTE — Addendum Note (Signed)
Addended by: Michelle Nasuti on: 06/10/2020 12:39 PM   Modules accepted: Orders

## 2020-06-10 NOTE — Patient Instructions (Signed)
Goals Addressed      Patient Stated   .  COMPLETED: "I need to get my A1C under 7" (pt-stated)        Current Barriers:  [name] Knowledge Deficits related to disease process and Self Health management of Diabetes . Chronic Disease Management support and education needs related to DM, CKD Stage 4, Hypertensive Nephropathy  Nurse Case Manager Clinical Goal(s):  . New 11/17/19 Over the next 90 days, patient will continue to work with the CCM team and PCP for disease education and support for improved Self Health Management of Diabetes Mellitus   CCM RN CM Interventions:  11/17/19 completed call with patient  . Evaluation of adherence to current treatment plan related to Diabetes and patient's adherence to plan as established by provider . Determined patient's current A1c is down to 6.8%; Re-educated on target A1c; Re-educated on daily glycemic control, FBS 80-130, after meals <180; Re-educated on dietary and exercise recommenations . Reviewed medications with patient, he reports adherence with taking Tresiba 200 units/ml, inject 25 units q hs, Semaglutide inject 0.5 mg once a week . Reinforced importance of adhering to a diabetic friendly diet, implementing exercise into his daily routine and taking his medications exactly as prescribed w/o missing doses  . Mailed printed educational materials related to Diabetes Care Schedule to patient's home for review at next f/u call  . Discussed plans with patient for ongoing care management follow up and provided patient with direct contact information for care management team  Patient Self Care Activities:  . Self administers medications as prescribed . Attends all scheduled provider appointments . Calls pharmacy for medication refills . Calls provider office for new concerns or questions . Supportive wife to assist with care needs   Please see past updates related to this goal by clicking on the "Past Updates" button in the selected goal   06/10/20 Please  see new [employer] care plan for updated goal.       Other   .  Diabetes - Glycemic Management Optimized   On track     Timeframe:  Long-Range Goal Priority:  High Start Date:  06/10/20                           Expected End Date:  06/10/21  Next Scheduled Follow up date: 07/26/20           Self-Care Activities Self administers oral medications as prescribed Self administers insulin as prescribed Self administers injectable DM medication (Ozempic) as prescribed Attends all scheduled provider appointments Checks blood sugars as prescribed and utilize hyper and hypoglycemia protocol as needed Adheres to prescribed ADA/carb modified Patient Goals: - check blood sugar at prescribed times - check blood sugar before and after exercise - check blood sugar if I feel it is too high or too low - enter blood sugar readings and medication or insulin into daily log - take the blood sugar log to all doctor visits - take the blood sugar meter to all doctor visits - set a realistic goal -     .  Obtain Eye Exam - Diabetes        Timeframe:  Long-Range Goal Priority:  Medium Start Date:  06/10/20                           Expected End Date: 06/10/21   Next Scheduled Follow up call: 07/26/20  Self-Care Activities Self administers oral medications as prescribed Self administers insulin as prescribed Self administers injectable DM medication (Ozempic) as prescribed Attends all scheduled provider appointments Checks blood sugars as prescribed and utilize hyper and hypoglycemia protocol as needed Adheres to prescribed ADA/carb modified Patient Goals:  - keep appointment with eye doctor - schedule appointment with eye doctor     .  Obtain Foot Exam - Diabetes        Timeframe:  Long-Range Goal Priority:  Medium Start Date:  06/10/20                          Expected End Date:  06/11/21  Next Scheduled Follow up call: 07/26/20         Self-Care Activities Self administers oral medications as  prescribed Self administers insulin as prescribed Self administers injectable DM medication (Ozempic) as prescribed Attends all scheduled provider appointments Checks blood sugars as prescribed and utilize hyper and hypoglycemia protocol as needed Adheres to prescribed ADA/carb modified Patient Goals: - check feet daily for cuts, sores or redness - do heel pump exercise 2 to 3 times each day - keep feet up while sitting - trim toenails straight across - wash and dry feet carefully every day - wear comfortable, cotton socks - wear comfortable, well-fitting shoes

## 2020-06-11 DIAGNOSIS — Z87440 Personal history of urinary (tract) infections: Secondary | ICD-10-CM | POA: Diagnosis not present

## 2020-06-11 DIAGNOSIS — I129 Hypertensive chronic kidney disease with stage 1 through stage 4 chronic kidney disease, or unspecified chronic kidney disease: Secondary | ICD-10-CM | POA: Diagnosis not present

## 2020-06-11 DIAGNOSIS — H903 Sensorineural hearing loss, bilateral: Secondary | ICD-10-CM | POA: Diagnosis not present

## 2020-06-11 DIAGNOSIS — Z9181 History of falling: Secondary | ICD-10-CM | POA: Diagnosis not present

## 2020-06-11 DIAGNOSIS — Z794 Long term (current) use of insulin: Secondary | ICD-10-CM | POA: Diagnosis not present

## 2020-06-11 DIAGNOSIS — I639 Cerebral infarction, unspecified: Secondary | ICD-10-CM

## 2020-06-11 DIAGNOSIS — E1140 Type 2 diabetes mellitus with diabetic neuropathy, unspecified: Secondary | ICD-10-CM | POA: Diagnosis not present

## 2020-06-11 DIAGNOSIS — N179 Acute kidney failure, unspecified: Secondary | ICD-10-CM | POA: Diagnosis not present

## 2020-06-11 DIAGNOSIS — H409 Unspecified glaucoma: Secondary | ICD-10-CM | POA: Diagnosis not present

## 2020-06-11 DIAGNOSIS — N184 Chronic kidney disease, stage 4 (severe): Secondary | ICD-10-CM | POA: Diagnosis not present

## 2020-06-11 DIAGNOSIS — Z87891 Personal history of nicotine dependence: Secondary | ICD-10-CM | POA: Diagnosis not present

## 2020-06-11 DIAGNOSIS — E1151 Type 2 diabetes mellitus with diabetic peripheral angiopathy without gangrene: Secondary | ICD-10-CM | POA: Diagnosis not present

## 2020-06-11 DIAGNOSIS — S42201D Unspecified fracture of upper end of right humerus, subsequent encounter for fracture with routine healing: Secondary | ICD-10-CM | POA: Diagnosis not present

## 2020-06-11 DIAGNOSIS — N2581 Secondary hyperparathyroidism of renal origin: Secondary | ICD-10-CM | POA: Diagnosis not present

## 2020-06-11 DIAGNOSIS — E785 Hyperlipidemia, unspecified: Secondary | ICD-10-CM | POA: Diagnosis not present

## 2020-06-11 DIAGNOSIS — G8929 Other chronic pain: Secondary | ICD-10-CM | POA: Diagnosis not present

## 2020-06-11 DIAGNOSIS — E1122 Type 2 diabetes mellitus with diabetic chronic kidney disease: Secondary | ICD-10-CM | POA: Diagnosis not present

## 2020-06-15 NOTE — Patient Instructions (Addendum)
Visit Information It was great speaking with you today!  Please let me know if you have any questions about our visit.  Goals Addressed            This Visit's Progress   . Manage My Medicine       Timeframe:  Long-Range Goal Priority:  High Start Date:                             Expected End Date:                       Follow Up Date 07/21/2020   - call for medicine refill 2 or 3 days before it runs out - keep a list of all the medicines I take; vitamins and herbals too - use a pillbox to sort medicine - use an alarm clock or phone to remind me to take my medicine    Why is this important?   . These steps will help you keep on track with your medicines.         Patient Care Plan: CCM Pharmacy Care Plan    Problem Identified: HTN, DM II, HLD   Priority: High    Long-Range Goal: Disease Management   This Visit's Progress: On track  Priority: High  Note:     Current Barriers:  . Unable to independently afford treatment regimen  Pharmacist Clinical Goal(s):  [name] Patient will achieve adherence to monitoring guidelines and medication adherence to achieve therapeutic efficacy through collaboration with PharmD and provider.   Interventions: . 1:1 collaboration with [name], MD regarding development and update of comprehensive plan of care as evidenced by provider attestation and co-signature . Inter-disciplinary care team collaboration (see longitudinal plan of care) . Comprehensive medication review performed; medication list updated in electronic medical record  Hypertension (BP goal <130/80) -Controlled -Current treatment: . Hydralazine 10 mg taking 1 tablet three times per day.  -Current home readings: patient reports that he does not have a BP monitor. He has never had an issue with his BP. -Current dietary habits: patient reports that he does not use salt.  -Current exercise habits:  -Denies hypotensive/hypertensive symptoms -Educated on Exercise goal  of 150 minutes per week; -Counseled to monitor BP at home at least three times per week, document, and provide log at future appointments -Collaborate with patient to order BP cuff from [facility] care.  -Recommended to continue current medication  Diabetes (A1c goal <7%) -Controlled -Current medications: .  [name] Ozempic 0.5 mg - injecting once a week on Saturday o He has one pin in each box. He reports that he gets three injections out of the box.  - He is going is coming in today to see about his insulin  - He reports his wife has the letter.  -Current home glucose readings . fasting glucose: <130 -Denies hypoglycemic/hyperglycemic symptoms -Current meal patterns:  . breakfast: regular food, bacon, sausage, [place] bacon, eggs, nothing has changed in patients diet. . Lunch-chicken salad sandwich o He does not eat bologna, hot dogs or cheese because of the sodium.    . Dinner: baked chicken, [place] wings,  . drinks: he is drinking plenty of water  -Current exercise: exercise his shoulder the best he could. Patient reports walking everyday on the sidewalk 150 yards.  -Educated on Exercise goal of 150 minutes per week; -Counseled to check feet daily and get yearly eye exams -Counseled  on diet and exercise extensively Recommended to continue current medication  -Collaborate with team to confirm patient assistance has been approved and sent for patient.     CKD Stage 4  -Controlled -Current treatment  . Ferrous Sulfate 325 mg (65 FE)- take 1 tablet by mouth daily . Hydralazine 10 mg tablet three times per day . Pravastatin 40 mg tablet once per day   -Reviewed patients current medications, and at this time none of these medications need to be renally adjusted. Will continue to monitor closely.  -Recommend patient continue current medication regimen and close follow up with nephrologist.   Health Maintenance -Vaccine gaps:   -Shingrix Vaccine  - COVID-19: 2ND BOOSTER    Patient Goals/Self-Care Activities . Patient will:  - take medications as prescribed  Follow Up Plan: Telephone follow up appointment with care management team member scheduled for: 07/21/2020 The patient has been provided with contact information for the care management team and has been advised to call with any health related questions or concerns.        Patient agreed to services and verbal consent obtained.   The patient verbalized understanding of instructions, educational materials, and care plan provided today and agreed to receive a mailed copy of patient instructions, educational materials, and care plan.   Justin Holloway, PharmD Clinical Pharmacist [facility] [phone]

## 2020-06-16 DIAGNOSIS — Z87891 Personal history of nicotine dependence: Secondary | ICD-10-CM | POA: Diagnosis not present

## 2020-06-16 DIAGNOSIS — S42201D Unspecified fracture of upper end of right humerus, subsequent encounter for fracture with routine healing: Secondary | ICD-10-CM | POA: Diagnosis not present

## 2020-06-16 DIAGNOSIS — E785 Hyperlipidemia, unspecified: Secondary | ICD-10-CM | POA: Diagnosis not present

## 2020-06-16 DIAGNOSIS — Z9181 History of falling: Secondary | ICD-10-CM | POA: Diagnosis not present

## 2020-06-16 DIAGNOSIS — I129 Hypertensive chronic kidney disease with stage 1 through stage 4 chronic kidney disease, or unspecified chronic kidney disease: Secondary | ICD-10-CM | POA: Diagnosis not present

## 2020-06-16 DIAGNOSIS — I639 Cerebral infarction, unspecified: Secondary | ICD-10-CM | POA: Diagnosis not present

## 2020-06-16 DIAGNOSIS — N184 Chronic kidney disease, stage 4 (severe): Secondary | ICD-10-CM | POA: Diagnosis not present

## 2020-06-16 DIAGNOSIS — H903 Sensorineural hearing loss, bilateral: Secondary | ICD-10-CM | POA: Diagnosis not present

## 2020-06-16 DIAGNOSIS — E1151 Type 2 diabetes mellitus with diabetic peripheral angiopathy without gangrene: Secondary | ICD-10-CM | POA: Diagnosis not present

## 2020-06-16 DIAGNOSIS — E1122 Type 2 diabetes mellitus with diabetic chronic kidney disease: Secondary | ICD-10-CM | POA: Diagnosis not present

## 2020-06-16 DIAGNOSIS — G8929 Other chronic pain: Secondary | ICD-10-CM | POA: Diagnosis not present

## 2020-06-16 DIAGNOSIS — N179 Acute kidney failure, unspecified: Secondary | ICD-10-CM | POA: Diagnosis not present

## 2020-06-16 DIAGNOSIS — N2581 Secondary hyperparathyroidism of renal origin: Secondary | ICD-10-CM | POA: Diagnosis not present

## 2020-06-16 DIAGNOSIS — Z87440 Personal history of urinary (tract) infections: Secondary | ICD-10-CM | POA: Diagnosis not present

## 2020-06-16 DIAGNOSIS — E1140 Type 2 diabetes mellitus with diabetic neuropathy, unspecified: Secondary | ICD-10-CM | POA: Diagnosis not present

## 2020-06-16 DIAGNOSIS — H409 Unspecified glaucoma: Secondary | ICD-10-CM | POA: Diagnosis not present

## 2020-06-16 DIAGNOSIS — Z794 Long term (current) use of insulin: Secondary | ICD-10-CM | POA: Diagnosis not present

## 2020-06-17 DIAGNOSIS — I129 Hypertensive chronic kidney disease with stage 1 through stage 4 chronic kidney disease, or unspecified chronic kidney disease: Secondary | ICD-10-CM | POA: Diagnosis not present

## 2020-06-17 DIAGNOSIS — E1151 Type 2 diabetes mellitus with diabetic peripheral angiopathy without gangrene: Secondary | ICD-10-CM | POA: Diagnosis not present

## 2020-06-17 DIAGNOSIS — S42201D Unspecified fracture of upper end of right humerus, subsequent encounter for fracture with routine healing: Secondary | ICD-10-CM | POA: Diagnosis not present

## 2020-06-17 DIAGNOSIS — E1140 Type 2 diabetes mellitus with diabetic neuropathy, unspecified: Secondary | ICD-10-CM | POA: Diagnosis not present

## 2020-06-17 DIAGNOSIS — N179 Acute kidney failure, unspecified: Secondary | ICD-10-CM | POA: Diagnosis not present

## 2020-06-17 DIAGNOSIS — H409 Unspecified glaucoma: Secondary | ICD-10-CM | POA: Diagnosis not present

## 2020-06-17 DIAGNOSIS — E785 Hyperlipidemia, unspecified: Secondary | ICD-10-CM | POA: Diagnosis not present

## 2020-06-17 DIAGNOSIS — Z87891 Personal history of nicotine dependence: Secondary | ICD-10-CM | POA: Diagnosis not present

## 2020-06-17 DIAGNOSIS — I639 Cerebral infarction, unspecified: Secondary | ICD-10-CM | POA: Diagnosis not present

## 2020-06-17 DIAGNOSIS — N2581 Secondary hyperparathyroidism of renal origin: Secondary | ICD-10-CM | POA: Diagnosis not present

## 2020-06-17 DIAGNOSIS — H903 Sensorineural hearing loss, bilateral: Secondary | ICD-10-CM | POA: Diagnosis not present

## 2020-06-17 DIAGNOSIS — Z9181 History of falling: Secondary | ICD-10-CM | POA: Diagnosis not present

## 2020-06-17 DIAGNOSIS — Z87440 Personal history of urinary (tract) infections: Secondary | ICD-10-CM | POA: Diagnosis not present

## 2020-06-17 DIAGNOSIS — N184 Chronic kidney disease, stage 4 (severe): Secondary | ICD-10-CM | POA: Diagnosis not present

## 2020-06-17 DIAGNOSIS — Z794 Long term (current) use of insulin: Secondary | ICD-10-CM | POA: Diagnosis not present

## 2020-06-17 DIAGNOSIS — G8929 Other chronic pain: Secondary | ICD-10-CM | POA: Diagnosis not present

## 2020-06-17 DIAGNOSIS — E1122 Type 2 diabetes mellitus with diabetic chronic kidney disease: Secondary | ICD-10-CM | POA: Diagnosis not present

## 2020-06-21 ENCOUNTER — Telehealth: Payer: Self-pay

## 2020-06-21 NOTE — Chronic Care Management (AMB) (Signed)
[facility] patient assistance to confirm delivery of medications. Representative stated patient has 5 boxes of ozempic, 2 boxes of tresiba and 2 boxes of needles on the way. Patient was approved on 06-16-2020 and will be in 10-14 business days. Left patient's wife a voicemail.  [place] Pharmacist Assistant [phone]

## 2020-06-21 NOTE — Chronic Care Management (AMB) (Signed)
Chronic Care Management   CCM RN Visit Note  06/04/2020 Name: Justin Holloway MRN: [id number] DOB: June 16, 1929  Subjective: Justin Holloway is a 87 y.o. year old male who is a primary care patient of [name], MD. The care management team was consulted for assistance with disease management and care coordination needs.    Engaged with patient by telephone for follow up visit in response to provider referral for case management and/or care coordination services.   Consent to Services:  The patient was given information about Chronic Care Management services, agreed to services, and gave verbal consent prior to initiation of services.  Please see initial visit note for detailed documentation.   Patient agreed to services and verbal consent obtained.   Assessment: Review of patient past medical history, allergies, medications, health status, including review of consultants reports, laboratory and other test data, was performed as part of comprehensive evaluation and provision of chronic care management services.   SDOH (Social Determinants of Health) assessments and interventions performed:  Yes, no acute challenges   CCM Care Plan  Allergies  Allergen Reactions   Gabapentin Itching    Outpatient Encounter Medications as of 06/04/2020  Medication Sig   Ascorbic Acid (VITAMIN C) 1000 MG tablet Take 1,000 mg by mouth daily.    aspirin EC 81 MG tablet Take 81 mg by mouth every evening.   Blood Glucose Monitoring Suppl (ONE TOUCH ULTRA 2) w/Device KIT Use as directed to check blood sugars 2 times per day dx: e11.22   cholecalciferol (VITAMIN D) 1000 units tablet Take 2,000 Units by mouth daily.    COMBIGAN 0.2-0.5 % ophthalmic solution Place 1 drop into both eyes every 12 (twelve) hours.   diclofenac Sodium (VOLTAREN) 1 % GEL Apply 4 g topically 4 (four) times daily. (Patient taking differently: Apply 2-4 g topically in the morning and at bedtime. shoulder)   ferrous sulfate 325  (65 FE) MG tablet Take 325 mg by mouth daily with breakfast.   furosemide (LASIX) 80 MG tablet TAKE 1 TABLET BY MOUTH IN  THE MORNING AND ONE-HALF  TABLET BY MOUTH IN THE  EARLY EVENING (Patient taking differently: Take 40-80 mg by mouth See admin instructions. 80 mg in the morning and 40 mg in the evening.)   glucose blood (ONETOUCH ULTRA) test strip USE STRIPS TO TEST BLOOD  SUGAR 3 TIMES DAILY 1 STRIP PER TEST   hydrALAZINE (APRESOLINE) 10 MG tablet Take 10 mg by mouth 3 (three) times daily.   Lancets (ONETOUCH DELICA PLUS [id number]) MISC 100 each by Does not apply route in the morning, at noon, and at bedtime.   LUMIGAN 0.01 % SOLN Place 1 drop into both eyes 2 (two) times daily.    Multiple Vitamin (MULTIVITAMIN WITH MINERALS) TABS tablet Take 1 tablet by mouth daily.   polyethylene glycol powder (GLYCOLAX/MIRALAX) 17 GM/SCOOP powder Take 17 g by mouth daily as needed for mild constipation.   pravastatin (PRAVACHOL) 40 MG tablet Take 1 tablet (40 mg total) by mouth daily. (Patient taking differently: Take 40 mg by mouth at bedtime.)   pregabalin (LYRICA) 25 MG capsule Take 1 capsule (25 mg total) by mouth at bedtime.   tamsulosin (FLOMAX) 0.4 MG CAPS capsule Take 0.4 mg by mouth daily.   [DISCONTINUED] insulin degludec (TRESIBA) 200 UNIT/ML FlexTouch Pen Inject 23 Units into the skin at bedtime.    [DISCONTINUED] Semaglutide,0.25 or 0.5MG/DOS, (OZEMPIC, 0.25 OR 0.5 MG/DOSE,) 2 MG/1.5ML SOPN Inject 0.5 mg into the skin once  a week. (Patient taking differently: Inject 0.5 mg into the skin every Sunday.)   [DISCONTINUED] traMADol (ULTRAM) 50 MG tablet Take 1 tablet (50 mg total) by mouth every 6 (six) hours as needed for moderate pain. (Patient not taking: Reported on 06/09/2020)   No facility-administered encounter medications on file as of 06/04/2020.    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   Type 2 diabetes mellitus with stage 4 chronic kidney disease, with long-term current use of insulin ([place]) 09/10/2019   Peripheral vascular disease, unspecified ([place]) 05/28/2019   Diabetic neuropathy ([place]) 02/18/2019   Left upper quadrant pain 09/04/2018   Acute midline low back pain without sciatica 09/04/2018   CKD (chronic kidney disease) stage 4, GFR 15-29 ml/min (HCC) 11/21/2017   Hypertensive nephropathy 11/21/2017   Right foot pain 11/21/2017   Vertigo 09/09/2017   BPPV (benign paroxysmal positional vertigo), right 09/06/2017   Sensorineural hearing loss (SNHL), bilateral 09/06/2017   Ataxia 02/06/2017   UTI (lower urinary tract infection) 08/07/2015   Dizziness 08/07/2015   Acute renal failure superimposed on stage 3 chronic kidney disease ([place]) 08/07/2015   Hyperkalemia 08/07/2015   Left inguinal hernia 05/15/2013   Diabetes mellitus without complication ([place]) 49/44/9675   HLD (hyperlipidemia) 03/08/2006   HYPERTENSION, BENIGN SYSTEMIC 03/08/2006   IMPOTENCE, 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   PROTEINURIA 03/08/2006    Conditions to be addressed/monitored:DM, CKD Stage 4, Hypertensive Nephropathy, Diabetic mononeuropathy associated with type 2 Diabetes, Vitamin D deficiency, HLD  Care Plan : General Plan of Care (Adult)  Updates made by Lynne Logan, RN since 06/04/2020 12:00 AM     Problem: Chronic Kidney Disease   Priority: High     Long-Range Goal: Maintain Adequate Kidney Function   Start Date: 01/15/2020  Expected End Date: 01/14/2021  Recent Progress: On track  Priority: High  Note:   Current Barriers:  Knowledge Deficits related to disease process and  treatment mangement for renal insufficiency/CKD Chronic Disease Management support and education needs related to DM, CKD Stage 4, Hypertensive Nephropathy Nurse Case Manager Clinical Goal(s):   Patient will continue to work with the CCM team and PCP for disease education and support for improved Self Health management of stage 4 CKD CCM RN CM Interventions:  06/04/20 completed successful inbound call with patient  Determined patient is calling in today to ask for assistance with obtaining his Ozempic Determined patient has 1 remaining dose of Ozempic and will need office samples until his PAP is approved Collaborated with [name] Pharm D regarding patient's need for Ozempic refill and determined refills were sent, samples will be provided if available, the pharmacist is assisting and has recently submitted the PAP application for approval, she will continue to follow patient for this need, patient is aware   Discussed plans with patient for ongoing care management follow up and provided patient with direct contact information for care management team Patient Goals/ Self Care Activities:  Continue to self administer medications as prescribed Attend all scheduled provider appointments Call pharmacy for medication refills Call provider  office for new concerns or questions Keep all MD f/u appointments with Dr. Carolin Sicks as scheduled  Adhere to water intake as directed by kidney doctor   Next planned follow up date: 07/26/20     Plan:Telephone follow up appointment with care management team member scheduled for:  07/26/20  Barb Merino, RN, BSN, CCM Care Management Coordinator [facility]  Direct Phone: [phone]

## 2020-06-22 DIAGNOSIS — Z87440 Personal history of urinary (tract) infections: Secondary | ICD-10-CM | POA: Diagnosis not present

## 2020-06-22 DIAGNOSIS — E1122 Type 2 diabetes mellitus with diabetic chronic kidney disease: Secondary | ICD-10-CM | POA: Diagnosis not present

## 2020-06-22 DIAGNOSIS — H409 Unspecified glaucoma: Secondary | ICD-10-CM | POA: Diagnosis not present

## 2020-06-22 DIAGNOSIS — Z794 Long term (current) use of insulin: Secondary | ICD-10-CM | POA: Diagnosis not present

## 2020-06-22 DIAGNOSIS — N2581 Secondary hyperparathyroidism of renal origin: Secondary | ICD-10-CM | POA: Diagnosis not present

## 2020-06-22 DIAGNOSIS — E1140 Type 2 diabetes mellitus with diabetic neuropathy, unspecified: Secondary | ICD-10-CM | POA: Diagnosis not present

## 2020-06-22 DIAGNOSIS — I129 Hypertensive chronic kidney disease with stage 1 through stage 4 chronic kidney disease, or unspecified chronic kidney disease: Secondary | ICD-10-CM | POA: Diagnosis not present

## 2020-06-22 DIAGNOSIS — Z87891 Personal history of nicotine dependence: Secondary | ICD-10-CM | POA: Diagnosis not present

## 2020-06-22 DIAGNOSIS — N184 Chronic kidney disease, stage 4 (severe): Secondary | ICD-10-CM | POA: Diagnosis not present

## 2020-06-22 DIAGNOSIS — S42201D Unspecified fracture of upper end of right humerus, subsequent encounter for fracture with routine healing: Secondary | ICD-10-CM | POA: Diagnosis not present

## 2020-06-22 DIAGNOSIS — G8929 Other chronic pain: Secondary | ICD-10-CM | POA: Diagnosis not present

## 2020-06-22 DIAGNOSIS — H903 Sensorineural hearing loss, bilateral: Secondary | ICD-10-CM | POA: Diagnosis not present

## 2020-06-22 DIAGNOSIS — E785 Hyperlipidemia, unspecified: Secondary | ICD-10-CM | POA: Diagnosis not present

## 2020-06-22 DIAGNOSIS — Z9181 History of falling: Secondary | ICD-10-CM | POA: Diagnosis not present

## 2020-06-22 DIAGNOSIS — I639 Cerebral infarction, unspecified: Secondary | ICD-10-CM | POA: Diagnosis not present

## 2020-06-22 DIAGNOSIS — N179 Acute kidney failure, unspecified: Secondary | ICD-10-CM | POA: Diagnosis not present

## 2020-06-22 DIAGNOSIS — E1151 Type 2 diabetes mellitus with diabetic peripheral angiopathy without gangrene: Secondary | ICD-10-CM | POA: Diagnosis not present

## 2020-06-23 DIAGNOSIS — S42201D Unspecified fracture of upper end of right humerus, subsequent encounter for fracture with routine healing: Secondary | ICD-10-CM | POA: Diagnosis not present

## 2020-06-26 DIAGNOSIS — E1140 Type 2 diabetes mellitus with diabetic neuropathy, unspecified: Secondary | ICD-10-CM | POA: Diagnosis not present

## 2020-06-26 DIAGNOSIS — S42201D Unspecified fracture of upper end of right humerus, subsequent encounter for fracture with routine healing: Secondary | ICD-10-CM | POA: Diagnosis not present

## 2020-06-26 DIAGNOSIS — N184 Chronic kidney disease, stage 4 (severe): Secondary | ICD-10-CM | POA: Diagnosis not present

## 2020-06-26 DIAGNOSIS — I639 Cerebral infarction, unspecified: Secondary | ICD-10-CM | POA: Diagnosis not present

## 2020-06-26 DIAGNOSIS — E1151 Type 2 diabetes mellitus with diabetic peripheral angiopathy without gangrene: Secondary | ICD-10-CM | POA: Diagnosis not present

## 2020-06-26 DIAGNOSIS — Z9181 History of falling: Secondary | ICD-10-CM | POA: Diagnosis not present

## 2020-06-26 DIAGNOSIS — H409 Unspecified glaucoma: Secondary | ICD-10-CM | POA: Diagnosis not present

## 2020-06-26 DIAGNOSIS — H903 Sensorineural hearing loss, bilateral: Secondary | ICD-10-CM | POA: Diagnosis not present

## 2020-06-26 DIAGNOSIS — Z87891 Personal history of nicotine dependence: Secondary | ICD-10-CM | POA: Diagnosis not present

## 2020-06-26 DIAGNOSIS — G8929 Other chronic pain: Secondary | ICD-10-CM | POA: Diagnosis not present

## 2020-06-26 DIAGNOSIS — Z794 Long term (current) use of insulin: Secondary | ICD-10-CM | POA: Diagnosis not present

## 2020-06-26 DIAGNOSIS — N179 Acute kidney failure, unspecified: Secondary | ICD-10-CM | POA: Diagnosis not present

## 2020-06-26 DIAGNOSIS — E785 Hyperlipidemia, unspecified: Secondary | ICD-10-CM | POA: Diagnosis not present

## 2020-06-26 DIAGNOSIS — N2581 Secondary hyperparathyroidism of renal origin: Secondary | ICD-10-CM | POA: Diagnosis not present

## 2020-06-26 DIAGNOSIS — I129 Hypertensive chronic kidney disease with stage 1 through stage 4 chronic kidney disease, or unspecified chronic kidney disease: Secondary | ICD-10-CM | POA: Diagnosis not present

## 2020-06-26 DIAGNOSIS — Z87440 Personal history of urinary (tract) infections: Secondary | ICD-10-CM | POA: Diagnosis not present

## 2020-06-26 DIAGNOSIS — E1122 Type 2 diabetes mellitus with diabetic chronic kidney disease: Secondary | ICD-10-CM | POA: Diagnosis not present

## 2020-06-28 DIAGNOSIS — E1151 Type 2 diabetes mellitus with diabetic peripheral angiopathy without gangrene: Secondary | ICD-10-CM | POA: Diagnosis not present

## 2020-06-28 DIAGNOSIS — Z9181 History of falling: Secondary | ICD-10-CM | POA: Diagnosis not present

## 2020-06-28 DIAGNOSIS — H903 Sensorineural hearing loss, bilateral: Secondary | ICD-10-CM | POA: Diagnosis not present

## 2020-06-28 DIAGNOSIS — G8929 Other chronic pain: Secondary | ICD-10-CM | POA: Diagnosis not present

## 2020-06-28 DIAGNOSIS — N2581 Secondary hyperparathyroidism of renal origin: Secondary | ICD-10-CM | POA: Diagnosis not present

## 2020-06-28 DIAGNOSIS — H409 Unspecified glaucoma: Secondary | ICD-10-CM | POA: Diagnosis not present

## 2020-06-28 DIAGNOSIS — E1140 Type 2 diabetes mellitus with diabetic neuropathy, unspecified: Secondary | ICD-10-CM | POA: Diagnosis not present

## 2020-06-28 DIAGNOSIS — Z87891 Personal history of nicotine dependence: Secondary | ICD-10-CM | POA: Diagnosis not present

## 2020-06-28 DIAGNOSIS — Z794 Long term (current) use of insulin: Secondary | ICD-10-CM | POA: Diagnosis not present

## 2020-06-28 DIAGNOSIS — N184 Chronic kidney disease, stage 4 (severe): Secondary | ICD-10-CM | POA: Diagnosis not present

## 2020-06-28 DIAGNOSIS — S42201D Unspecified fracture of upper end of right humerus, subsequent encounter for fracture with routine healing: Secondary | ICD-10-CM | POA: Diagnosis not present

## 2020-06-28 DIAGNOSIS — E785 Hyperlipidemia, unspecified: Secondary | ICD-10-CM | POA: Diagnosis not present

## 2020-06-28 DIAGNOSIS — I129 Hypertensive chronic kidney disease with stage 1 through stage 4 chronic kidney disease, or unspecified chronic kidney disease: Secondary | ICD-10-CM | POA: Diagnosis not present

## 2020-06-28 DIAGNOSIS — I639 Cerebral infarction, unspecified: Secondary | ICD-10-CM | POA: Diagnosis not present

## 2020-06-28 DIAGNOSIS — N179 Acute kidney failure, unspecified: Secondary | ICD-10-CM | POA: Diagnosis not present

## 2020-06-28 DIAGNOSIS — E1122 Type 2 diabetes mellitus with diabetic chronic kidney disease: Secondary | ICD-10-CM | POA: Diagnosis not present

## 2020-06-28 DIAGNOSIS — Z87440 Personal history of urinary (tract) infections: Secondary | ICD-10-CM | POA: Diagnosis not present

## 2020-06-30 DIAGNOSIS — N184 Chronic kidney disease, stage 4 (severe): Secondary | ICD-10-CM | POA: Diagnosis not present

## 2020-06-30 DIAGNOSIS — N2581 Secondary hyperparathyroidism of renal origin: Secondary | ICD-10-CM | POA: Diagnosis not present

## 2020-06-30 DIAGNOSIS — I129 Hypertensive chronic kidney disease with stage 1 through stage 4 chronic kidney disease, or unspecified chronic kidney disease: Secondary | ICD-10-CM | POA: Diagnosis not present

## 2020-06-30 DIAGNOSIS — N189 Chronic kidney disease, unspecified: Secondary | ICD-10-CM | POA: Diagnosis not present

## 2020-06-30 DIAGNOSIS — D631 Anemia in chronic kidney disease: Secondary | ICD-10-CM | POA: Diagnosis not present

## 2020-06-30 DIAGNOSIS — R609 Edema, unspecified: Secondary | ICD-10-CM | POA: Diagnosis not present

## 2020-07-01 DIAGNOSIS — E1151 Type 2 diabetes mellitus with diabetic peripheral angiopathy without gangrene: Secondary | ICD-10-CM | POA: Diagnosis not present

## 2020-07-01 DIAGNOSIS — I639 Cerebral infarction, unspecified: Secondary | ICD-10-CM | POA: Diagnosis not present

## 2020-07-01 DIAGNOSIS — Z794 Long term (current) use of insulin: Secondary | ICD-10-CM | POA: Diagnosis not present

## 2020-07-01 DIAGNOSIS — H903 Sensorineural hearing loss, bilateral: Secondary | ICD-10-CM | POA: Diagnosis not present

## 2020-07-01 DIAGNOSIS — N2581 Secondary hyperparathyroidism of renal origin: Secondary | ICD-10-CM | POA: Diagnosis not present

## 2020-07-01 DIAGNOSIS — E1140 Type 2 diabetes mellitus with diabetic neuropathy, unspecified: Secondary | ICD-10-CM | POA: Diagnosis not present

## 2020-07-01 DIAGNOSIS — N184 Chronic kidney disease, stage 4 (severe): Secondary | ICD-10-CM | POA: Diagnosis not present

## 2020-07-01 DIAGNOSIS — G8929 Other chronic pain: Secondary | ICD-10-CM | POA: Diagnosis not present

## 2020-07-01 DIAGNOSIS — S42201D Unspecified fracture of upper end of right humerus, subsequent encounter for fracture with routine healing: Secondary | ICD-10-CM | POA: Diagnosis not present

## 2020-07-01 DIAGNOSIS — H409 Unspecified glaucoma: Secondary | ICD-10-CM | POA: Diagnosis not present

## 2020-07-01 DIAGNOSIS — N179 Acute kidney failure, unspecified: Secondary | ICD-10-CM | POA: Diagnosis not present

## 2020-07-01 DIAGNOSIS — E785 Hyperlipidemia, unspecified: Secondary | ICD-10-CM | POA: Diagnosis not present

## 2020-07-01 DIAGNOSIS — Z9181 History of falling: Secondary | ICD-10-CM | POA: Diagnosis not present

## 2020-07-01 DIAGNOSIS — Z87440 Personal history of urinary (tract) infections: Secondary | ICD-10-CM | POA: Diagnosis not present

## 2020-07-01 DIAGNOSIS — Z87891 Personal history of nicotine dependence: Secondary | ICD-10-CM | POA: Diagnosis not present

## 2020-07-01 DIAGNOSIS — E1122 Type 2 diabetes mellitus with diabetic chronic kidney disease: Secondary | ICD-10-CM | POA: Diagnosis not present

## 2020-07-01 DIAGNOSIS — I129 Hypertensive chronic kidney disease with stage 1 through stage 4 chronic kidney disease, or unspecified chronic kidney disease: Secondary | ICD-10-CM | POA: Diagnosis not present

## 2020-07-04 DIAGNOSIS — G8929 Other chronic pain: Secondary | ICD-10-CM | POA: Diagnosis not present

## 2020-07-04 DIAGNOSIS — E1122 Type 2 diabetes mellitus with diabetic chronic kidney disease: Secondary | ICD-10-CM | POA: Diagnosis not present

## 2020-07-04 DIAGNOSIS — I639 Cerebral infarction, unspecified: Secondary | ICD-10-CM | POA: Diagnosis not present

## 2020-07-04 DIAGNOSIS — S42201D Unspecified fracture of upper end of right humerus, subsequent encounter for fracture with routine healing: Secondary | ICD-10-CM | POA: Diagnosis not present

## 2020-07-06 DIAGNOSIS — I639 Cerebral infarction, unspecified: Secondary | ICD-10-CM | POA: Diagnosis not present

## 2020-07-06 DIAGNOSIS — Z794 Long term (current) use of insulin: Secondary | ICD-10-CM | POA: Diagnosis not present

## 2020-07-06 DIAGNOSIS — Z9181 History of falling: Secondary | ICD-10-CM | POA: Diagnosis not present

## 2020-07-06 DIAGNOSIS — E785 Hyperlipidemia, unspecified: Secondary | ICD-10-CM | POA: Diagnosis not present

## 2020-07-06 DIAGNOSIS — I129 Hypertensive chronic kidney disease with stage 1 through stage 4 chronic kidney disease, or unspecified chronic kidney disease: Secondary | ICD-10-CM | POA: Diagnosis not present

## 2020-07-06 DIAGNOSIS — E1151 Type 2 diabetes mellitus with diabetic peripheral angiopathy without gangrene: Secondary | ICD-10-CM | POA: Diagnosis not present

## 2020-07-06 DIAGNOSIS — Z87891 Personal history of nicotine dependence: Secondary | ICD-10-CM | POA: Diagnosis not present

## 2020-07-06 DIAGNOSIS — N2581 Secondary hyperparathyroidism of renal origin: Secondary | ICD-10-CM | POA: Diagnosis not present

## 2020-07-06 DIAGNOSIS — Z87440 Personal history of urinary (tract) infections: Secondary | ICD-10-CM | POA: Diagnosis not present

## 2020-07-06 DIAGNOSIS — S42201D Unspecified fracture of upper end of right humerus, subsequent encounter for fracture with routine healing: Secondary | ICD-10-CM | POA: Diagnosis not present

## 2020-07-06 DIAGNOSIS — H903 Sensorineural hearing loss, bilateral: Secondary | ICD-10-CM | POA: Diagnosis not present

## 2020-07-06 DIAGNOSIS — N179 Acute kidney failure, unspecified: Secondary | ICD-10-CM | POA: Diagnosis not present

## 2020-07-06 DIAGNOSIS — N184 Chronic kidney disease, stage 4 (severe): Secondary | ICD-10-CM | POA: Diagnosis not present

## 2020-07-06 DIAGNOSIS — H409 Unspecified glaucoma: Secondary | ICD-10-CM | POA: Diagnosis not present

## 2020-07-06 DIAGNOSIS — E1140 Type 2 diabetes mellitus with diabetic neuropathy, unspecified: Secondary | ICD-10-CM | POA: Diagnosis not present

## 2020-07-06 DIAGNOSIS — E1122 Type 2 diabetes mellitus with diabetic chronic kidney disease: Secondary | ICD-10-CM | POA: Diagnosis not present

## 2020-07-06 DIAGNOSIS — G8929 Other chronic pain: Secondary | ICD-10-CM | POA: Diagnosis not present

## 2020-07-07 ENCOUNTER — Telehealth: Payer: Self-pay

## 2020-07-07 NOTE — Chronic Care Management (AMB) (Signed)
LVM for patients wife to inform them that his Ozempic  is at his PCP office.  [place] Pharmacist Assistant [phone]

## 2020-07-13 DIAGNOSIS — E1140 Type 2 diabetes mellitus with diabetic neuropathy, unspecified: Secondary | ICD-10-CM | POA: Diagnosis not present

## 2020-07-13 DIAGNOSIS — I639 Cerebral infarction, unspecified: Secondary | ICD-10-CM | POA: Diagnosis not present

## 2020-07-13 DIAGNOSIS — I129 Hypertensive chronic kidney disease with stage 1 through stage 4 chronic kidney disease, or unspecified chronic kidney disease: Secondary | ICD-10-CM | POA: Diagnosis not present

## 2020-07-13 DIAGNOSIS — Z87440 Personal history of urinary (tract) infections: Secondary | ICD-10-CM | POA: Diagnosis not present

## 2020-07-13 DIAGNOSIS — N184 Chronic kidney disease, stage 4 (severe): Secondary | ICD-10-CM | POA: Diagnosis not present

## 2020-07-13 DIAGNOSIS — E1122 Type 2 diabetes mellitus with diabetic chronic kidney disease: Secondary | ICD-10-CM | POA: Diagnosis not present

## 2020-07-13 DIAGNOSIS — E1151 Type 2 diabetes mellitus with diabetic peripheral angiopathy without gangrene: Secondary | ICD-10-CM | POA: Diagnosis not present

## 2020-07-13 DIAGNOSIS — S42201D Unspecified fracture of upper end of right humerus, subsequent encounter for fracture with routine healing: Secondary | ICD-10-CM | POA: Diagnosis not present

## 2020-07-13 DIAGNOSIS — E785 Hyperlipidemia, unspecified: Secondary | ICD-10-CM | POA: Diagnosis not present

## 2020-07-13 DIAGNOSIS — N2581 Secondary hyperparathyroidism of renal origin: Secondary | ICD-10-CM | POA: Diagnosis not present

## 2020-07-13 DIAGNOSIS — Z9181 History of falling: Secondary | ICD-10-CM | POA: Diagnosis not present

## 2020-07-13 DIAGNOSIS — G8929 Other chronic pain: Secondary | ICD-10-CM | POA: Diagnosis not present

## 2020-07-13 DIAGNOSIS — Z794 Long term (current) use of insulin: Secondary | ICD-10-CM | POA: Diagnosis not present

## 2020-07-13 DIAGNOSIS — H409 Unspecified glaucoma: Secondary | ICD-10-CM | POA: Diagnosis not present

## 2020-07-13 DIAGNOSIS — Z87891 Personal history of nicotine dependence: Secondary | ICD-10-CM | POA: Diagnosis not present

## 2020-07-13 DIAGNOSIS — N179 Acute kidney failure, unspecified: Secondary | ICD-10-CM | POA: Diagnosis not present

## 2020-07-13 DIAGNOSIS — H903 Sensorineural hearing loss, bilateral: Secondary | ICD-10-CM | POA: Diagnosis not present

## 2020-07-20 ENCOUNTER — Ambulatory Visit: Payer: Medicare Other | Admitting: Internal Medicine

## 2020-07-20 ENCOUNTER — Telehealth: Payer: Self-pay

## 2020-07-20 NOTE — Chronic Care Management (AMB) (Signed)
  Patient aware of telephone appointment with [name] CPP on 07-21-2020 at 11:30.Patient aware to have/bring all medications, supplements, blood pressure and/or blood sugar logs to visit.  Questions:  Have you had any recent office visit or specialist visit outside of [place]? Patient stated no  Are there any concerns you would like to discuss during your office visit? Patient stated no  Are you having any problems obtaining your medications? (Whether it pharmacy issues or cost) Patient stated no  If patient has any PAP medications ask if they are having any problems getting their PAP medication or refill? Patient stated no  Star Rating Drug: Ozempic 0.5 mg- Patient Assistance Pravastatin 40 mg- Last filled 05-21-2020 90 DS [facility]  Any gaps in medications fill history? No   [place] Pharmacist Assistant [phone]

## 2020-07-21 ENCOUNTER — Telehealth: Payer: Self-pay

## 2020-07-21 ENCOUNTER — Ambulatory Visit: Payer: Medicare Other | Admitting: Internal Medicine

## 2020-07-22 DIAGNOSIS — E1151 Type 2 diabetes mellitus with diabetic peripheral angiopathy without gangrene: Secondary | ICD-10-CM | POA: Diagnosis not present

## 2020-07-22 DIAGNOSIS — H409 Unspecified glaucoma: Secondary | ICD-10-CM | POA: Diagnosis not present

## 2020-07-22 DIAGNOSIS — N2581 Secondary hyperparathyroidism of renal origin: Secondary | ICD-10-CM | POA: Diagnosis not present

## 2020-07-22 DIAGNOSIS — E1122 Type 2 diabetes mellitus with diabetic chronic kidney disease: Secondary | ICD-10-CM | POA: Diagnosis not present

## 2020-07-22 DIAGNOSIS — N179 Acute kidney failure, unspecified: Secondary | ICD-10-CM | POA: Diagnosis not present

## 2020-07-22 DIAGNOSIS — I639 Cerebral infarction, unspecified: Secondary | ICD-10-CM | POA: Diagnosis not present

## 2020-07-22 DIAGNOSIS — Z9181 History of falling: Secondary | ICD-10-CM | POA: Diagnosis not present

## 2020-07-22 DIAGNOSIS — E785 Hyperlipidemia, unspecified: Secondary | ICD-10-CM | POA: Diagnosis not present

## 2020-07-22 DIAGNOSIS — N184 Chronic kidney disease, stage 4 (severe): Secondary | ICD-10-CM | POA: Diagnosis not present

## 2020-07-22 DIAGNOSIS — H903 Sensorineural hearing loss, bilateral: Secondary | ICD-10-CM | POA: Diagnosis not present

## 2020-07-22 DIAGNOSIS — Z794 Long term (current) use of insulin: Secondary | ICD-10-CM | POA: Diagnosis not present

## 2020-07-22 DIAGNOSIS — G8929 Other chronic pain: Secondary | ICD-10-CM | POA: Diagnosis not present

## 2020-07-22 DIAGNOSIS — Z87891 Personal history of nicotine dependence: Secondary | ICD-10-CM | POA: Diagnosis not present

## 2020-07-22 DIAGNOSIS — E1140 Type 2 diabetes mellitus with diabetic neuropathy, unspecified: Secondary | ICD-10-CM | POA: Diagnosis not present

## 2020-07-22 DIAGNOSIS — I129 Hypertensive chronic kidney disease with stage 1 through stage 4 chronic kidney disease, or unspecified chronic kidney disease: Secondary | ICD-10-CM | POA: Diagnosis not present

## 2020-07-22 DIAGNOSIS — S42201D Unspecified fracture of upper end of right humerus, subsequent encounter for fracture with routine healing: Secondary | ICD-10-CM | POA: Diagnosis not present

## 2020-07-22 DIAGNOSIS — Z87440 Personal history of urinary (tract) infections: Secondary | ICD-10-CM | POA: Diagnosis not present

## 2020-07-23 DIAGNOSIS — Z794 Long term (current) use of insulin: Secondary | ICD-10-CM | POA: Diagnosis not present

## 2020-07-23 DIAGNOSIS — E1151 Type 2 diabetes mellitus with diabetic peripheral angiopathy without gangrene: Secondary | ICD-10-CM | POA: Diagnosis not present

## 2020-07-23 DIAGNOSIS — I129 Hypertensive chronic kidney disease with stage 1 through stage 4 chronic kidney disease, or unspecified chronic kidney disease: Secondary | ICD-10-CM | POA: Diagnosis not present

## 2020-07-23 DIAGNOSIS — N179 Acute kidney failure, unspecified: Secondary | ICD-10-CM | POA: Diagnosis not present

## 2020-07-23 DIAGNOSIS — E785 Hyperlipidemia, unspecified: Secondary | ICD-10-CM | POA: Diagnosis not present

## 2020-07-23 DIAGNOSIS — H903 Sensorineural hearing loss, bilateral: Secondary | ICD-10-CM | POA: Diagnosis not present

## 2020-07-23 DIAGNOSIS — Z87440 Personal history of urinary (tract) infections: Secondary | ICD-10-CM | POA: Diagnosis not present

## 2020-07-23 DIAGNOSIS — N184 Chronic kidney disease, stage 4 (severe): Secondary | ICD-10-CM | POA: Diagnosis not present

## 2020-07-23 DIAGNOSIS — H409 Unspecified glaucoma: Secondary | ICD-10-CM | POA: Diagnosis not present

## 2020-07-23 DIAGNOSIS — N2581 Secondary hyperparathyroidism of renal origin: Secondary | ICD-10-CM | POA: Diagnosis not present

## 2020-07-23 DIAGNOSIS — E1122 Type 2 diabetes mellitus with diabetic chronic kidney disease: Secondary | ICD-10-CM | POA: Diagnosis not present

## 2020-07-23 DIAGNOSIS — Z87891 Personal history of nicotine dependence: Secondary | ICD-10-CM | POA: Diagnosis not present

## 2020-07-23 DIAGNOSIS — G8929 Other chronic pain: Secondary | ICD-10-CM | POA: Diagnosis not present

## 2020-07-23 DIAGNOSIS — Z9181 History of falling: Secondary | ICD-10-CM | POA: Diagnosis not present

## 2020-07-23 DIAGNOSIS — I639 Cerebral infarction, unspecified: Secondary | ICD-10-CM | POA: Diagnosis not present

## 2020-07-23 DIAGNOSIS — S42201D Unspecified fracture of upper end of right humerus, subsequent encounter for fracture with routine healing: Secondary | ICD-10-CM | POA: Diagnosis not present

## 2020-07-23 DIAGNOSIS — E1140 Type 2 diabetes mellitus with diabetic neuropathy, unspecified: Secondary | ICD-10-CM | POA: Diagnosis not present

## 2020-07-26 ENCOUNTER — Telehealth: Payer: Medicare Other

## 2020-07-26 ENCOUNTER — Ambulatory Visit (INDEPENDENT_AMBULATORY_CARE_PROVIDER_SITE_OTHER): Payer: Medicare Other

## 2020-07-26 DIAGNOSIS — Z794 Long term (current) use of insulin: Secondary | ICD-10-CM | POA: Diagnosis not present

## 2020-07-26 DIAGNOSIS — E785 Hyperlipidemia, unspecified: Secondary | ICD-10-CM | POA: Diagnosis not present

## 2020-07-26 DIAGNOSIS — S42201D Unspecified fracture of upper end of right humerus, subsequent encounter for fracture with routine healing: Secondary | ICD-10-CM | POA: Diagnosis not present

## 2020-07-26 DIAGNOSIS — H409 Unspecified glaucoma: Secondary | ICD-10-CM | POA: Diagnosis not present

## 2020-07-26 DIAGNOSIS — E1141 Type 2 diabetes mellitus with diabetic mononeuropathy: Secondary | ICD-10-CM

## 2020-07-26 DIAGNOSIS — E119 Type 2 diabetes mellitus without complications: Secondary | ICD-10-CM | POA: Diagnosis not present

## 2020-07-26 DIAGNOSIS — I129 Hypertensive chronic kidney disease with stage 1 through stage 4 chronic kidney disease, or unspecified chronic kidney disease: Secondary | ICD-10-CM | POA: Diagnosis not present

## 2020-07-26 DIAGNOSIS — E1151 Type 2 diabetes mellitus with diabetic peripheral angiopathy without gangrene: Secondary | ICD-10-CM | POA: Diagnosis not present

## 2020-07-26 DIAGNOSIS — N184 Chronic kidney disease, stage 4 (severe): Secondary | ICD-10-CM

## 2020-07-26 DIAGNOSIS — E559 Vitamin D deficiency, unspecified: Secondary | ICD-10-CM

## 2020-07-26 DIAGNOSIS — N179 Acute kidney failure, unspecified: Secondary | ICD-10-CM | POA: Diagnosis not present

## 2020-07-26 DIAGNOSIS — E1140 Type 2 diabetes mellitus with diabetic neuropathy, unspecified: Secondary | ICD-10-CM | POA: Diagnosis not present

## 2020-07-26 DIAGNOSIS — Z87440 Personal history of urinary (tract) infections: Secondary | ICD-10-CM | POA: Diagnosis not present

## 2020-07-26 DIAGNOSIS — Z87891 Personal history of nicotine dependence: Secondary | ICD-10-CM | POA: Diagnosis not present

## 2020-07-26 DIAGNOSIS — G8929 Other chronic pain: Secondary | ICD-10-CM | POA: Diagnosis not present

## 2020-07-26 DIAGNOSIS — Z9181 History of falling: Secondary | ICD-10-CM | POA: Diagnosis not present

## 2020-07-26 DIAGNOSIS — E1122 Type 2 diabetes mellitus with diabetic chronic kidney disease: Secondary | ICD-10-CM | POA: Diagnosis not present

## 2020-07-26 DIAGNOSIS — I639 Cerebral infarction, unspecified: Secondary | ICD-10-CM | POA: Diagnosis not present

## 2020-07-26 DIAGNOSIS — N2581 Secondary hyperparathyroidism of renal origin: Secondary | ICD-10-CM | POA: Diagnosis not present

## 2020-07-26 DIAGNOSIS — H903 Sensorineural hearing loss, bilateral: Secondary | ICD-10-CM | POA: Diagnosis not present

## 2020-07-26 NOTE — Chronic Care Management (AMB) (Signed)
Chronic Care Management   CCM RN Visit Note  07/26/2020 Name: Justin Holloway MRN: [id number] DOB: 01-16-1929  Subjective: Justin Holloway is a 87 y.o. year old male who is a primary care patient of [name], MD. The care management team was consulted for assistance with disease management and care coordination needs.    Engaged with patient by telephone for follow up visit in response to provider referral for case management and/or care coordination services.   Consent to Services:  The patient was given information about Chronic Care Management services, agreed to services, and gave verbal consent prior to initiation of services.  Please see initial visit note for detailed documentation.   Patient agreed to services and verbal consent obtained.   Assessment: Review of patient past medical history, allergies, medications, health status, including review of consultants reports, laboratory and other test data, was performed as part of comprehensive evaluation and provision of chronic care management services.   SDOH (Social Determinants of Health) assessments and interventions performed:  Yes, no acute challenges   CCM Care Plan  Allergies  Allergen Reactions   Gabapentin Itching    Outpatient Encounter Medications as of 07/26/2020  Medication Sig   Ascorbic Acid (VITAMIN C) 1000 MG tablet Take 1,000 mg by mouth daily.    aspirin EC 81 MG tablet Take 81 mg by mouth every evening.   Blood Glucose Monitoring Suppl (ONE TOUCH ULTRA 2) w/Device KIT Use as directed to check blood sugars 2 times per day dx: e11.22   cholecalciferol (VITAMIN D) 1000 units tablet Take 2,000 Units by mouth daily.    COMBIGAN 0.2-0.5 % ophthalmic solution Place 1 drop into both eyes every 12 (twelve) hours.   diclofenac Sodium (VOLTAREN) 1 % GEL Apply 4 g topically 4 (four) times daily. (Patient taking differently: Apply 2-4 g topically in the morning and at bedtime. shoulder)   ferrous sulfate 325  (65 FE) MG tablet Take 325 mg by mouth daily with breakfast.   furosemide (LASIX) 80 MG tablet TAKE 1 TABLET BY MOUTH IN  THE MORNING AND ONE-HALF  TABLET BY MOUTH IN THE  EARLY EVENING (Patient taking differently: Take 40-80 mg by mouth See admin instructions. 80 mg in the morning and 40 mg in the evening.)   glucose blood (ONETOUCH ULTRA) test strip USE STRIPS TO TEST BLOOD  SUGAR 3 TIMES DAILY 1 STRIP PER TEST   hydrALAZINE (APRESOLINE) 10 MG tablet Take 10 mg by mouth 3 (three) times daily.   insulin degludec (TRESIBA) 200 UNIT/ML FlexTouch Pen Inject 24 Units into the skin at bedtime.   Lancets (ONETOUCH DELICA PLUS [id number]) MISC 100 each by Does not apply route in the morning, at noon, and at bedtime.   LUMIGAN 0.01 % SOLN Place 1 drop into both eyes 2 (two) times daily.    Multiple Vitamin (MULTIVITAMIN WITH MINERALS) TABS tablet Take 1 tablet by mouth daily.   polyethylene glycol powder (GLYCOLAX/MIRALAX) 17 GM/SCOOP powder Take 17 g by mouth daily as needed for mild constipation.   pravastatin (PRAVACHOL) 40 MG tablet Take 1 tablet (40 mg total) by mouth daily. (Patient taking differently: Take 40 mg by mouth at bedtime.)   pregabalin (LYRICA) 25 MG capsule Take 1 capsule (25 mg total) by mouth at bedtime.   Semaglutide,0.25 or 0.5MG/DOS, (OZEMPIC, 0.25 OR 0.5 MG/DOSE,) 2 MG/1.5ML SOPN Inject 0.5 mg into the skin once a week.   tamsulosin (FLOMAX) 0.4 MG CAPS capsule Take 0.4 mg by mouth daily.  No facility-administered encounter medications on file as of 07/26/2020.    Patient Active Problem List   Diagnosis Date Noted   Generalized weakness 05/10/2020   Failure to thrive in adult 05/10/2020   Chronic pain 05/10/2020   Constipation 05/10/2020   Class 1 drug-induced obesity with body mass index (BMI) of 34.0 to 34.9 in adult 05/10/2020   DNR (do not resuscitate) 05/10/2020   Hypertension    Hyperlipidemia    Glaucoma    CKD (chronic kidney disease), stage IV ([place])    Secondary  renal hyperparathyroidism ([place]) 01/14/2020   Type 2 diabetes mellitus with stage 4 chronic kidney disease, with long-term current use of insulin ([place]) 09/10/2019   Peripheral vascular disease, unspecified ([place]) 05/28/2019   Diabetic neuropathy ([place]) 02/18/2019   Left upper quadrant pain 09/04/2018   Acute midline low back pain without sciatica 09/04/2018   CKD (chronic kidney disease) stage 4, GFR 15-29 ml/min ([facility]) 11/21/2017   Hypertensive nephropathy 11/21/2017   Right foot pain 11/21/2017   Vertigo 09/09/2017   BPPV (benign paroxysmal positional vertigo), right 09/06/2017   Sensorineural hearing loss (SNHL), bilateral 09/06/2017   Ataxia 02/06/2017   UTI (lower urinary tract infection) 08/07/2015   Dizziness 08/07/2015   Acute renal failure superimposed on stage 3 chronic kidney disease ([place]) 08/07/2015   Hyperkalemia 08/07/2015   Left inguinal hernia 05/15/2013   Diabetes mellitus without complication ([place]) 09/32/6712   HLD (hyperlipidemia) 03/08/2006   HYPERTENSION, BENIGN SYSTEMIC 03/08/2006   IMPOTENCE, 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   PROTEINURIA 03/08/2006    Conditions to be addressed/monitored: DM, CKD Stage 4, Hypertensive Nephropathy, Diabetic mononeuropathy associated with type 2 Diabetes, Vitamin D deficiency, HLD  Care Plan : Chronic Kidney disease  Updates made by Lynne Logan, RN since 07/26/2020 12:00 AM     Problem: Chronic Kidney Disease   Priority: High     Long-Range Goal: Maintain Adequate Kidney Function   Start Date: 01/15/2020  Expected End Date: 01/14/2021  Recent Progress: On track  Priority: High  Note:   Objective:  Lab Results  Component Value Date   CREATININE 3.06 (H) 06/09/2020   CREATININE 2.83 (H) 05/15/2020   CREATININE 3.16 (H) 05/14/2020   Lab Results  Component Value Date   EGFR 19 (L) 06/09/2020   Current Barriers:  Knowledge Deficits related to disease process and treatment mangement for renal insufficiency/CKD Chronic Disease  Management support and education needs related to DM, CKD Stage 4, Hypertensive Nephropathy Nurse Case Manager Clinical Goal(s):   Patient will continue to work with the CCM team and PCP for disease education and support for improved Self Health management of stage 4 CKD CCM RN CM Interventions:  07/26/20 completed successful inbound call with patient  Provided education to patient about basic disease process related to Chronic Kidney disease  Review of patient status, including review of consultant's reports, relevant laboratory and other test results, and medications completed. Reviewed medications with patient and discussed importance of medication adherence Determined patient continues to be under the care of Dr. Francina Ames patient on water intake unless otherwise directed by Dr. Carolin Sicks, Educated on dietary recommendations  Confirmed patient received the printed educational materials related to Chronic Kidney disease  Discussed plans with patient for ongoing care management follow up and provided patient with direct contact information for care management team Patient Goals/ Self Care Activities:  Continue to adhere to MD recommendations for CKD  Continue to keep all scheduled follow up appointments Take medications as directed  Let  your healthcare team know if you are unable to take your medications Call your pharmacy for refills at least 7 days prior to running out of medication Increase your water intake unless otherwise directed  Follow Up Plan: Telephone follow up appointment with care management team member scheduled for: 09/20/20    Care Plan : Wellness (Adult)  Updates made by Lynne Logan, RN since 07/26/2020 12:00 AM  Completed 07/26/2020   Problem: Medication Adherence (Neuropathy) Resolved 07/26/2020  Priority: High     Goal: Medication Adherence Maintained for Neuropathy Completed 07/26/2020  Start Date: 02/25/2020  Expected End Date: 06/04/2020  Recent  Progress: On track  Priority: High  Note:   Current Barriers:  Ineffective Self Health Maintenance Currently UNABLE TO independently self manage needs related to chronic health conditions.  Knowledge Deficits related to short term plan for care coordination needs and long term plans for chronic disease management needs Clinical Goal(s):  Collaboration with [name], MD regarding development and update of comprehensive plan of care as evidenced by provider attestation and co-signature Inter-disciplinary care team collaboration (see longitudinal plan of care) Over the next 90 days, patient will work with care management team to address care coordination and chronic disease management needs related to Disease Management Educational Needs Care Coordination Medication Management and Education Medication Assistance  Psychosocial Support   Interventions:  07/26/20 completed successful outbound call with patient  Evaluation of current treatment plan related to Neuropathy self-management and patient's adherence to plan as established by provider. Collaboration with [name], MD regarding development and update of comprehensive plan of care as evidenced by provider attestation       and co-signature Inter-disciplinary care team collaboration (see longitudinal plan of care) Determined patient remains to be active with the embedded Pharm D, he denies missing any doses of his medications, patient is receiving PAP for Ozempic Discussed plans with patient for ongoing care management follow up and provided patient with direct contact information for care management team Patient Goals/Self Care Activities:  Over the next 90 days, patient will:  Work with embedded Pharm D for medication assistance with cost of Lyrica    Care Plan : Diabetes Type 2 (Adult)  Updates made by Lynne Logan, RN since 07/26/2020 12:00 AM     Problem: Glycemic Management (Diabetes, Type 2)   Priority: High      Long-Range Goal: Glycemic Management Optimized   Start Date: 06/10/2020  Expected End Date: 05/10/2021  Recent Progress: On track  Priority: High  Note:   Objective:  Lab Results  Component Value Date   HGBA1C 7.3 (H) 06/09/2020   Lab Results  Component Value Date   CREATININE 3.06 (H) 06/09/2020   CREATININE 2.83 (H) 05/15/2020   CREATININE 3.16 (H) 05/14/2020   Lab Results  Component Value Date   EGFR 19 (L) 06/09/2020   Current Barriers:  Knowledge Deficits related to basic Diabetes pathophysiology and self care/management Knowledge Deficits related to medications used for management of diabetes Difficulty obtaining or cannot afford medications Case Manager Clinical Goal(s):  patient will demonstrate improved adherence to prescribed treatment plan for diabetes self care/management as evidenced by: daily monitoring and recording of CBG  adherence to ADA/ carb modified diet exercise 5 days/week adherence to prescribed medication regimen contacting provider for new or worsened symptoms or questions Interventions:  07/26/20 completed successful inbound call from patient  Collaboration with [name], MD regarding development and update of comprehensive plan of care as evidenced by provider attestation and  co-signature Inter-disciplinary care team collaboration (see longitudinal plan of care) Provided education to patient about basic DM disease process Review of patient status, including review of consultants reports, relevant laboratory and other test results, and medications completed. Reviewed medications with patient and discussed importance of medication adherence Educated patient on dietary and exercise recommendations; daily glycemic control FBS 80-130, <180 after meals;15'15' rule Determined patient missed some doses of his Ozempic while inpatient rehab due to staff RN missed some scheduled doses but feels he is back on track since being back home  Discussed plans with  patient for ongoing care management follow up and provided patient with direct contact information for care management team Self-Care Activities Self administers oral medications as prescribed Self administers insulin as prescribed Self administers injectable DM medication (Ozempic) as prescribed Attends all scheduled provider appointments Checks blood sugars as prescribed and utilize hyper and hypoglycemia protocol as needed Adheres to prescribed ADA/carb modified Patient Goals: - check blood sugar at prescribed times - check blood sugar before and after exercise - check blood sugar if I feel it is too high or too low - enter blood sugar readings and medication or insulin into daily log - take the blood sugar log to all doctor visits - take the blood sugar meter to all doctor visits - set a realistic goal - keep appointment with eye doctor - schedule appointment with eye doctor - check feet daily for cuts, sores or redness - do heel pump exercise 2 to 3 times each day - keep feet up while sitting - trim toenails straight across - wash and dry feet carefully every day - wear comfortable, cotton socks - wear comfortable, well-fitting shoes  Follow Up Plan: Telephone follow up appointment with care management team member scheduled for: 09/20/20    Care Plan : Vitamin D deficiency  Updates made by Lynne Logan, RN since 07/26/2020 12:00 AM     Problem: Disease Progression   Priority: Medium     Long-Range Goal: Treatment optimized   Start Date: 07/26/2020  Expected End Date: 07/26/2021  This Visit's Progress: On track  Priority: Medium  Note:   Current Barriers:  Ineffective Self Health Maintenance in a patient with  Vitamin D deficiency  Clinical Goal(s):  Collaboration with [name], MD regarding development and update of comprehensive plan of care as evidenced by provider attestation and co-signature Inter-disciplinary care team collaboration (see longitudinal plan  of care) patient will work with care management team to address care coordination and chronic disease management needs related to Disease Management Educational Needs Care Coordination Medication Management and Education Psychosocial Support   Interventions:  07/26/20 completed successful outbound call with patient  Evaluation of current treatment plan related to  Vitamin D deficiency , self-management and patient's adherence to plan as established by provider. Collaboration with [name], MD regarding development and update of comprehensive plan of care as evidenced by provider attestation       and co-signature Inter-disciplinary care team collaboration (see longitudinal plan of care) Provided education to patient about basic disease process for Vitamin D deficiency Review of patient status, including review of consultant's reports, relevant laboratory and other test results, and medications completed. Reviewed medications with patient and discussed importance of medication adherence Educated patient on dietary recommendations, get at least 15 minutes of natural sunlight daily when possible Discussed plans with patient for ongoing care management follow up and provided patient with direct contact information for care management team Self Care Activities:  Self  administers medications as prescribed Attends all scheduled provider appointments Calls pharmacy for medication refills Calls provider office for new concerns or questions Patient Goals: - get at least 15 minutes of natural sunlight daily when possible - eat Vitamin D rich foods   Follow Up Plan: Telephone follow up appointment with care management team member scheduled for: 09/20/20     Care Plan : Hyperlipidemia  Updates made by Lynne Logan, RN since 07/26/2020 12:00 AM     Problem: Hyperlipidemia   Priority: Medium     Long-Range Goal: Hyperlipidemia - disease progression prevented or minimized   Start Date:  07/26/2020  Expected End Date: 07/26/2021  This Visit's Progress: On track  Priority: Medium  Note:   Current Barriers:  Ineffective Self Health Maintenance in a patient with  Hyperlipdemia  Clinical Goal(s):  Collaboration with [name], MD regarding development and update of comprehensive plan of care as evidenced by provider attestation and co-signature Inter-disciplinary care team collaboration (see longitudinal plan of care) patient will work with care management team to address care coordination and chronic disease management needs related to Disease Management Educational Needs Care Coordination Medication Management and Education Psychosocial Support   Interventions:  07/26/20 completed successful outbound call with patient  Evaluation of current treatment plan related to  Hyperlipidemia  , self-management and patient's adherence to plan as established by provider. Collaboration with [name], MD regarding development and update of comprehensive plan of care as evidenced by provider attestation       and co-signature Inter-disciplinary care team collaboration (see longitudinal plan of care) Provided education to patient about basic disease process related to Hyperlipidemia  Review of patient status, including review of consultant's reports, relevant laboratory and other test results, and medications completed. Reviewed medications with patient and discussed importance of medication adherence Educated patient on dietary and exercise recommendations  Mailed printed educational material related to "My Cholesterol Guide" Discussed plans with patient for ongoing care management follow up and provided patient with direct contact information for care management team Self Care Activities:  Self administers medications as prescribed Attends all scheduled provider appointments Calls pharmacy for medication refills Calls provider office for new concerns or questions Patient  Goals: - to adhere to dietary and exercise recommendations   Follow Up Plan: Telephone follow up appointment with care management team member scheduled for: 09/20/20     Care Plan : Constipation  Updates made by Lynne Logan, RN since 07/26/2020 12:00 AM     Problem: Constipation   Priority: High     Long-Range Goal: Constipation - complications prevented or minimized   Start Date: 07/26/2020  Expected End Date: 01/26/2021  This Visit's Progress: On track  Priority: High  Note:   Current Barriers:  Ineffective Self Health Maintenance in a patient with DM, CKD Stage 4, Hypertensive Nephropathy, Diabetic mononeuropathy associated with type 2 Diabetes, Vitamin D deficiency, HLD Clinical Goal(s):  Collaboration with [name], MD regarding development and update of comprehensive plan of care as evidenced by provider attestation and co-signature Inter-disciplinary care team collaboration (see longitudinal plan of care) patient will work with care management team to address care coordination and chronic disease management needs related to Disease Management Educational Needs Care Coordination Medication Management and Education Psychosocial Support   Interventions:  07/26/20 completed successful outbound call with patient  Evaluation of current treatment plan related to DM, CKD Stage 4, Hypertensive Nephropathy, Diabetic mononeuropathy associated with type 2 Diabetes, Vitamin D deficiency, HLD, self-management and patient's  adherence to plan as established by provider. Collaboration with [name], MD regarding development and update of comprehensive plan of care as evidenced by provider attestation       and co-signature Inter-disciplinary care team collaboration (see longitudinal plan of care) Provided education to patient about basic disease process related to Constipation  Review of patient status, including review of consultant's reports, relevant laboratory and other test  results, and medications completed. Reviewed medications with patient and discussed importance of medication adherence Educated patient on dietary and water recommendations, educated on importance of staying active while adhering to prescribed HEP Educated and instructed providing rationale to patient how to participate in home bowel training Sent in basket message to embedded Pharm D and PCP asking to consider switching patient from traditional iron to Fusion Plus  Discussed plans with patient for ongoing care management follow up and provided patient with direct contact information for care management team Self Care Activities:  Self administers medications as prescribed Attends all scheduled provider appointments Calls pharmacy for medication refills Calls provider office for new concerns or questions Patient Goals: - increase fiber intake - increase water intake - stay active by adhering to HEP - participate in home bowel training regimen as discussed   Follow Up Plan: Telephone follow up appointment with care management team member scheduled for: 09/20/20     Plan:Telephone follow up appointment with care management team member scheduled for:  09/20/20  Barb Merino, RN, BSN, CCM Care Management Coordinator [facility]  Direct Phone: [phone]

## 2020-07-26 NOTE — Patient Instructions (Addendum)
Goals Addressed       Maintain adequate kidney function (pt-stated)   On track     Timeframe:  Long-Range Goal Priority:  High Start Date:  01/15/20                         Expected End Date: 01/14/21  Next Follow up date: 09/20/20  Continue to self administer medications as prescribed Attend all scheduled provider appointments Call pharmacy for medication refills Call provider office for new concerns or questions Keep all MD f/u appointments with Dr. Carolin Sicks as scheduled  Adhere to water intake as directed by kidney doctor                   Other     Constipation - complications prevented or minimized        Timeframe:  Long-Range Goal Priority:  High Start Date:  07/26/20                           Expected End Date:  01/27/20  Next Scheduled follow up: 09/20/20         Self Care Activities:  Self administers medications as prescribed Attends all scheduled provider appointments Calls pharmacy for medication refills Calls provider office for new concerns or questions Patient Goals: - increase fiber intake - increase water intake - stay active by adhering to HEP - participate in home bowel training regimen as discussed                     Diabetes - Glycemic Management Optimized   On track     Timeframe:  Long-Range Goal Priority:  High Start Date:  06/10/20                           Expected End Date:  06/10/21  Next Scheduled Follow up: 09/20/20            Self-Care Activities Self administers oral medications as prescribed Self administers insulin as prescribed Self administers injectable DM medication (Ozempic) as prescribed Attends all scheduled provider appointments Checks blood sugars as prescribed and utilize hyper and hypoglycemia protocol as needed Adheres to prescribed ADA/carb modified Patient Goals: - check blood sugar at prescribed times - check blood sugar before and after exercise - check blood sugar if I feel it is too high or too low - enter blood  sugar readings and medication or insulin into daily log - take the blood sugar log to all doctor visits - take the blood sugar meter to all doctor visits - set a realistic goal -       Hyperlipidemia - disease progression prevented or minimized   On track     Timeframe:  Long-Range Goal Priority:  Medium Start Date:  07/26/20                           Expected End Date:  07/26/21  Next Scheduled follow up: 09/20/20    Self Care Activities:  Self administers medications as prescribed Attends all scheduled provider appointments Calls pharmacy for medication refills Calls provider office for new concerns or questions Patient Goals: - to adhere to dietary and exercise recommendations                         Obtain Eye Exam -  Diabetes   On track     Timeframe:  Long-Range Goal Priority:  Medium Start Date:  06/10/20                           Expected End Date: 06/10/21   Next Scheduled Follow up call: 09/20/20     Self-Care Activities Self administers oral medications as prescribed Self administers insulin as prescribed Self administers injectable DM medication (Ozempic) as prescribed Attends all scheduled provider appointments Checks blood sugars as prescribed and utilize hyper and hypoglycemia protocol as needed Adheres to prescribed ADA/carb modified Patient Goals:  - keep appointment with eye doctor - schedule appointment with eye doctor       Obtain Foot Exam - Diabetes   On track     Timeframe:  Long-Range Goal Priority:  Medium Start Date:  06/10/20                          Expected End Date:  06/11/21  Next Scheduled Follow up call: 09/20/20        Self-Care Activities Self administers oral medications as prescribed Self administers insulin as prescribed Self administers injectable DM medication (Ozempic) as prescribed Attends all scheduled provider appointments Checks blood sugars as prescribed and utilize hyper and hypoglycemia protocol as needed Adheres to prescribed  ADA/carb modified Patient Goals: - check feet daily for cuts, sores or redness - do heel pump exercise 2 to 3 times each day - keep feet up while sitting - trim toenails straight across - wash and dry feet carefully every day - wear comfortable, cotton socks - wear comfortable, well-fitting shoes                   Patient Stated        Vitamin D deficiency - treatment optimized   On track     Timeframe:  Long-Range Goal Priority:  Medium Start Date:  07/26/20                           Expected End Date: 07/26/21  Next Scheduled follow up: 09/20/20          Self Care Activities:  Self administers medications as prescribed Attends all scheduled provider appointments Calls pharmacy for medication refills Calls provider office for new concerns or questions Patient Goals: - get at least 15 minutes of natural sunlight daily when possible - eat Vitamin D rich foods

## 2020-07-27 ENCOUNTER — Telehealth: Payer: Self-pay

## 2020-07-27 NOTE — Telephone Encounter (Signed)
The pt was notified that DR Baird [facility] said to pickup samples of fusoion plus from the office and to take 1 per day, to discontinue his current iron because Glenard Haring reported that the pt said the ferrous sulfate was constipating him.

## 2020-07-28 ENCOUNTER — Ambulatory Visit: Payer: Medicare Other

## 2020-07-28 DIAGNOSIS — S42201D Unspecified fracture of upper end of right humerus, subsequent encounter for fracture with routine healing: Secondary | ICD-10-CM | POA: Diagnosis not present

## 2020-08-03 DIAGNOSIS — S42201D Unspecified fracture of upper end of right humerus, subsequent encounter for fracture with routine healing: Secondary | ICD-10-CM | POA: Diagnosis not present

## 2020-08-03 DIAGNOSIS — G8929 Other chronic pain: Secondary | ICD-10-CM | POA: Diagnosis not present

## 2020-08-03 DIAGNOSIS — I639 Cerebral infarction, unspecified: Secondary | ICD-10-CM | POA: Diagnosis not present

## 2020-08-03 DIAGNOSIS — E1122 Type 2 diabetes mellitus with diabetic chronic kidney disease: Secondary | ICD-10-CM | POA: Diagnosis not present

## 2020-08-17 ENCOUNTER — Other Ambulatory Visit: Payer: Self-pay

## 2020-08-17 ENCOUNTER — Encounter: Payer: Self-pay | Admitting: Internal Medicine

## 2020-08-17 ENCOUNTER — Ambulatory Visit (INDEPENDENT_AMBULATORY_CARE_PROVIDER_SITE_OTHER): Payer: Medicare Other | Admitting: Internal Medicine

## 2020-08-17 VITALS — BP 122/60 | HR 66 | Temp 97.7°F | Ht 64.0 in | Wt 203.6 lb

## 2020-08-17 DIAGNOSIS — E0841 Diabetes mellitus due to underlying condition with diabetic mononeuropathy: Secondary | ICD-10-CM | POA: Diagnosis not present

## 2020-08-17 DIAGNOSIS — R269 Unspecified abnormalities of gait and mobility: Secondary | ICD-10-CM | POA: Diagnosis not present

## 2020-08-17 DIAGNOSIS — R2689 Other abnormalities of gait and mobility: Secondary | ICD-10-CM | POA: Diagnosis not present

## 2020-08-17 DIAGNOSIS — E559 Vitamin D deficiency, unspecified: Secondary | ICD-10-CM

## 2020-08-17 DIAGNOSIS — I129 Hypertensive chronic kidney disease with stage 1 through stage 4 chronic kidney disease, or unspecified chronic kidney disease: Secondary | ICD-10-CM | POA: Diagnosis not present

## 2020-08-17 DIAGNOSIS — E1122 Type 2 diabetes mellitus with diabetic chronic kidney disease: Secondary | ICD-10-CM

## 2020-08-17 DIAGNOSIS — Z794 Long term (current) use of insulin: Secondary | ICD-10-CM | POA: Diagnosis not present

## 2020-08-17 DIAGNOSIS — N184 Chronic kidney disease, stage 4 (severe): Secondary | ICD-10-CM | POA: Diagnosis not present

## 2020-08-17 DIAGNOSIS — I700 Atherosclerosis of aorta: Secondary | ICD-10-CM

## 2020-08-17 MED ORDER — PREGABALIN 25 MG PO CAPS: 25.0000 mg | ORAL_CAPSULE | Freq: Every day | ORAL | 0 refills | Status: DC

## 2020-08-17 NOTE — Patient Instructions (Signed)
Diabetes Mellitus and Nutrition, Adult When you have diabetes, or diabetes mellitus, it is very important to have healthy eating habits because your blood sugar (glucose) levels are greatly affected by what you eat and drink. Eating healthy foods in the right amounts, at about the same times every day, can help you:  Control your blood glucose.  Lower your risk of heart disease.  Improve your blood pressure.  Reach or maintain a healthy weight. What can affect my meal plan? Every person with diabetes is different, and each person has different needs for a meal plan. Your health care provider may recommend that you work with a dietitian to make a meal plan that is best for you. Your meal plan may vary depending on factors such as:  The calories you need.  The medicines you take.  Your weight.  Your blood glucose, blood pressure, and cholesterol levels.  Your activity level.  Other health conditions you have, such as heart or kidney disease. How do carbohydrates affect me? Carbohydrates, also called carbs, affect your blood glucose level more than any other type of food. Eating carbs naturally raises the amount of glucose in your blood. Carb counting is a method for keeping track of how many carbs you eat. Counting carbs is important to keep your blood glucose at a healthy level, especially if you use insulin or take certain oral diabetes medicines. It is important to know how many carbs you can safely have in each meal. This is different for every person. Your dietitian can help you calculate how many carbs you should have at each meal and for each snack. How does alcohol affect me? Alcohol can cause a sudden decrease in blood glucose (hypoglycemia), especially if you use insulin or take certain oral diabetes medicines. Hypoglycemia can be a life-threatening condition. Symptoms of hypoglycemia, such as sleepiness, dizziness, and confusion, are similar to symptoms of having too much  alcohol.  Do not drink alcohol if: ? Your health care provider tells you not to drink. ? You are pregnant, may be pregnant, or are planning to become pregnant.  If you drink alcohol: ? Do not drink on an empty stomach. ? Limit how much you use to:  0-1 drink a day for women.  0-2 drinks a day for men. ? Be aware of how much alcohol is in your drink. In the [place] one drink equals one 12 oz bottle of beer (355 mL), one 5 oz glass of wine (148 mL), or one 1 oz glass of hard liquor (44 mL). ? Keep yourself hydrated with water, diet soda, or unsweetened iced tea.  Keep in mind that regular soda, juice, and other mixers may contain a lot of sugar and must be counted as carbs. What are tips for following this plan? Reading food labels  Start by checking the serving size on the "Nutrition Facts" label of packaged foods and drinks. The amount of calories, carbs, fats, and other nutrients listed on the label is based on one serving of the item. Many items contain more than one serving per package.  Check the total grams (g) of carbs in one serving. You can calculate the number of servings of carbs in one serving by dividing the total carbs by 15. For example, if a food has 30 g of total carbs per serving, it would be equal to 2 servings of carbs.  Check the number of grams (g) of saturated fats and trans fats in one serving. Choose foods that have   a low amount or none of these fats.  Check the number of milligrams (mg) of salt (sodium) in one serving. Most people should limit total sodium intake to less than 2,300 mg per day.  Always check the nutrition information of foods labeled as "low-fat" or "nonfat." These foods may be higher in added sugar or refined carbs and should be avoided.  Talk to your dietitian to identify your daily goals for nutrients listed on the label. Shopping  Avoid buying canned, pre-made, or processed foods. These foods tend to be high in fat, sodium, and added  sugar.  Shop around the outside edge of the grocery store. This is where you will most often find fresh fruits and vegetables, bulk grains, fresh meats, and fresh dairy. Cooking  Use low-heat cooking methods, such as baking, instead of high-heat cooking methods like deep frying.  Cook using healthy oils, such as olive, canola, or sunflower oil.  Avoid cooking with butter, cream, or high-fat meats. Meal planning  Eat meals and snacks regularly, preferably at the same times every day. Avoid going long periods of time without eating.  Eat foods that are high in fiber, such as fresh fruits, vegetables, beans, and whole grains. Talk with your dietitian about how many servings of carbs you can eat at each meal.  Eat 4-6 oz (112-168 g) of lean protein each day, such as lean meat, chicken, fish, eggs, or tofu. One ounce (oz) of lean protein is equal to: ? 1 oz (28 g) of meat, chicken, or fish. ? 1 egg. ?  cup (62 g) of tofu.  Eat some foods each day that contain healthy fats, such as avocado, nuts, seeds, and fish.   What foods should I eat? Fruits Berries. Apples. Oranges. Peaches. Apricots. Plums. Grapes. Mango. Papaya. Pomegranate. Kiwi. Cherries. Vegetables Lettuce. Spinach. Leafy greens, including kale, chard, collard greens, and mustard greens. Beets. Cauliflower. Cabbage. Broccoli. Carrots. Green beans. Tomatoes. Peppers. Onions. Cucumbers. Brussels sprouts. Grains Whole grains, such as whole-wheat or whole-grain bread, crackers, tortillas, cereal, and pasta. Unsweetened oatmeal. Quinoa. Brown or wild rice. Meats and other proteins Seafood. Poultry without skin. Lean cuts of poultry and beef. Tofu. Nuts. Seeds. Dairy Low-fat or fat-free dairy products such as milk, yogurt, and cheese. The items listed above may not be a complete list of foods and beverages you can eat. Contact a dietitian for more information. What foods should I avoid? Fruits Fruits canned with  syrup. Vegetables Canned vegetables. Frozen vegetables with butter or cream sauce. Grains Refined white flour and flour products such as bread, pasta, snack foods, and cereals. Avoid all processed foods. Meats and other proteins Fatty cuts of meat. Poultry with skin. Breaded or fried meats. Processed meat. Avoid saturated fats. Dairy Full-fat yogurt, cheese, or milk. Beverages Sweetened drinks, such as soda or iced tea. The items listed above may not be a complete list of foods and beverages you should avoid. Contact a dietitian for more information. Questions to ask a health care provider  Do I need to meet with a diabetes educator?  Do I need to meet with a dietitian?  What number can I call if I have questions?  When are the best times to check my blood glucose? Where to find more information:  American Diabetes Association: diabetes.org  Academy of Nutrition and Dietetics: www.eatright.org  National Institute of Diabetes and Digestive and Kidney Diseases: www.niddk.nih.gov  Association of Diabetes Care and Education Specialists: www.diabeteseducator.org Summary  It is important to have healthy eating   habits because your blood sugar (glucose) levels are greatly affected by what you eat and drink.  A healthy meal plan will help you control your blood glucose and maintain a healthy lifestyle.  Your health care provider may recommend that you work with a dietitian to make a meal plan that is best for you.  Keep in mind that carbohydrates (carbs) and alcohol have immediate effects on your blood glucose levels. It is important to count carbs and to use alcohol carefully. This information is not intended to replace advice given to you by your health care provider. Make sure you discuss any questions you have with your health care provider. Document Revised: 12/03/2018 Document Reviewed: 12/03/2018 Elsevier Patient Education  2021 Elsevier Inc.  

## 2020-08-17 NOTE — Progress Notes (Signed)
I,Justin Holloway,acting as a Education administrator for Justin Greenland, MD.,have documented all relevant documentation on the behalf of Justin Greenland, MD,as directed by  Justin Greenland, MD while in the presence of Justin Greenland, MD.  This visit occurred during the SARS-CoV-2 public health emergency.  Safety protocols were in place, including screening questions prior to the visit, additional usage of staff PPE, and extensive cleaning of exam room while observing appropriate contact time as indicated for disinfecting solutions.  Subjective:     Patient ID: Justin Holloway , male    DOB: 1929/08/30 , 87 y.o.   MRN: [id number]   Chief Complaint  Patient presents with  . Diabetes  . Hypertension    HPI  Patient is here for DM follow up. He would like to discuss issues with neuropathy. He states that it has gotten really bad and keeps him awake at night.   Diabetes He presents for his follow-up diabetic visit. He has type 2 diabetes mellitus. His disease course has been stable. There are no hypoglycemic associated symptoms. Pertinent negatives for diabetes include no blurred vision. There are no hypoglycemic complications. Risk factors for coronary artery disease include dyslipidemia, hypertension, diabetes mellitus, male sex, sedentary lifestyle and obesity. He is compliant with treatment most of the time. He is following a generally healthy diet. He participates in exercise intermittently. Eye exam is not current.  Hypertension This is a chronic problem. The current episode started more than 1 year ago. The problem is controlled. Pertinent negatives include no blurred vision, palpitations or shortness of breath. Past treatments include diuretics. The current treatment provides moderate improvement. Hypertensive end-organ damage includes kidney disease.    Past Medical History:  Diagnosis Date  . Arthritis   . CKD (chronic kidney disease), stage IV ([place])   . Diabetes mellitus without complication  (Justin Holloway)   . Glaucoma    "blurred vision", see Dr Justin Holloway yearly  . Hyperlipidemia   . Hypertension   . TIA (transient ischemic attack)      Family History  Problem Relation Age of Onset  . Cancer Mother 63       breast  . COPD Father 40       Congestive heart failure     Current Outpatient Medications:  .  Ascorbic Acid (VITAMIN C) 1000 MG tablet, Take 1,000 mg by mouth daily. , Disp: , Rfl:  .  aspirin EC 81 MG tablet, Take 81 mg by mouth every evening., Disp: , Rfl:  .  Blood Glucose Monitoring Suppl (ONE TOUCH ULTRA 2) w/Device KIT, Use as directed to check blood sugars 2 times per day dx: e11.22, Disp: 1 kit, Rfl: 0 .  cholecalciferol (VITAMIN D) 1000 units tablet, Take 2,000 Units by mouth daily. , Disp: , Rfl:  .  COMBIGAN 0.2-0.5 % ophthalmic solution, Place 1 drop into both eyes every 12 (twelve) hours., Disp: , Rfl:  .  diclofenac Sodium (VOLTAREN) 1 % GEL, Apply 4 g topically 4 (four) times daily. (Patient taking differently: Apply 2-4 g topically in the morning and at bedtime. shoulder), Disp: 100 g, Rfl: 0 .  furosemide (LASIX) 80 MG tablet, TAKE 1 TABLET BY MOUTH IN  THE MORNING AND ONE-HALF  TABLET BY MOUTH IN THE  EARLY EVENING (Patient taking differently: Take 40-80 mg by mouth See admin instructions. 80 mg in the morning and 40 mg in the evening.), Disp: 135 tablet, Rfl: 3 .  glucose blood (ONETOUCH ULTRA) test strip, USE STRIPS TO TEST  BLOOD  SUGAR 3 TIMES DAILY 1 STRIP PER TEST, Disp: 300 strip, Rfl: 3 .  hydrALAZINE (APRESOLINE) 10 MG tablet, Take 10 mg by mouth 3 (three) times daily., Disp: , Rfl:  .  insulin degludec (TRESIBA) 200 UNIT/ML FlexTouch Pen, Inject 24 Units into the skin at bedtime., Disp: 3 mL, Rfl: 1 .  Iron-FA-B Cmp-C-Biot-Probiotic (FUSION PLUS) CAPS, Take by mouth. 1 capsule per day, Disp: , Rfl:  .  Lancets (ONETOUCH DELICA PLUS [id number]) MISC, 100 each by Does not apply route in the morning, at noon, and at bedtime., Disp: 300 each, Rfl: 3 .   LUMIGAN 0.01 % SOLN, Place 1 drop into both eyes 2 (two) times daily. , Disp: , Rfl:  .  Multiple Vitamin (MULTIVITAMIN WITH MINERALS) TABS tablet, Take 1 tablet by mouth daily., Disp: , Rfl:  .  polyethylene glycol powder (GLYCOLAX/MIRALAX) 17 GM/SCOOP powder, Take 17 g by mouth daily as needed for mild constipation., Disp: , Rfl:  .  pravastatin (PRAVACHOL) 40 MG tablet, Take 1 tablet (40 mg total) by mouth daily. (Patient taking differently: Take 40 mg by mouth at bedtime.), Disp: 90 tablet, Rfl: 3 .  pregabalin (LYRICA) 25 MG capsule, Take 1 capsule (25 mg total) by mouth at bedtime., Disp: 90 capsule, Rfl: 0 .  Semaglutide,0.25 or 0.5MG/DOS, (OZEMPIC, 0.25 OR 0.5 MG/DOSE,) 2 MG/1.5ML SOPN, Inject 0.5 mg into the skin once a week., Disp: 1.5 mL, Rfl: 1 .  tamsulosin (FLOMAX) 0.4 MG CAPS capsule, Take 0.4 mg by mouth daily., Disp: , Rfl:    Allergies  Allergen Reactions  . Gabapentin Itching     Review of Systems  Constitutional: Negative.   Eyes:  Negative for blurred vision.  Respiratory: Negative.  Negative for shortness of breath.   Cardiovascular: Negative.  Negative for palpitations.  Gastrointestinal: Negative.   Musculoskeletal:  Positive for arthralgias.  Neurological:  Positive for numbness.    Today's Vitals   08/17/20 1128  BP: 122/60  Pulse: 66  Temp: 97.7 F (36.5 C)  TempSrc: Oral  Weight: 203 lb 9.6 oz (92.4 kg)  Height: 5' 4"  (1.626 m)   Body mass index is 34.95 kg/m.   Objective:  Physical Exam Vitals and nursing note reviewed.  Constitutional:      Appearance: Normal appearance.  HENT:     Head: Normocephalic and atraumatic.  Cardiovascular:     Rate and Rhythm: Normal rate and regular rhythm.     Heart sounds: Normal heart sounds.  Pulmonary:     Effort: Pulmonary effort is normal.     Breath sounds: Normal breath sounds.  Skin:    General: Skin is warm.  Neurological:     Mental Status: He is alert. Mental status is at baseline.      Comments: Appears to have shuffling gait  Psychiatric:        Mood and Affect: Mood normal.        Assessment And Plan:     1. Type 2 diabetes mellitus with stage 4 chronic kidney disease, with long-term current use of insulin (HCC) Comments: Chronic, I will check labs at ne Importance of dietary and medication compliance was d/w pt.  2. Diabetic mononeuropathy associated with diabetes mellitus due to underlying condition [facility]) Comments: I will refer him to Neuro for further evaluation of neuropathy.  3. Hypertensive nephropathy Comments: Chronic, well controlled. Advised to follow low sodium diet.   4. Atherosclerosis of aorta (HCC) Comments: HEe is encouraged to follow heart healthy  lifestyle and reminded to limit his intake of fried foods and to perform chair exercises while watching TV.   5. Imbalance Comments: I will schedule him for MRI brain. He agrees to Neuro evaluation as well. He has reaquested to see a new provider, previously seen at [facility].  - MR Brain Wo Contrast; Future - Ambulatory referral to Neurology  6. Gait difficulty Comments: Appears to have shuffling gait. Ambulatory w/ walker.  - Ambulatory referral to Neurology   Patient was given opportunity to ask questions. Patient verbalized understanding of the plan and was able to repeat key elements of the plan. All questions were answered to their satisfaction.   I, Justin Greenland, MD, have reviewed all documentation for this visit. The documentation on 08/17/20 for the exam, diagnosis, procedures, and orders are all accurate and complete.   IF YOU HAVE BEEN REFERRED TO A SPECIALIST, IT MAY TAKE 1-2 WEEKS TO SCHEDULE/PROCESS THE REFERRAL. IF YOU HAVE NOT HEARD FROM US/SPECIALIST IN TWO WEEKS, PLEASE GIVE [place] A CALL AT [phone].   THE PATIENT IS ENCOURAGED TO PRACTICE SOCIAL DISTANCING DUE TO THE COVID-19 PANDEMIC.

## 2020-08-18 ENCOUNTER — Ambulatory Visit (INDEPENDENT_AMBULATORY_CARE_PROVIDER_SITE_OTHER): Payer: Medicare Other

## 2020-08-18 VITALS — Ht 65.0 in | Wt 202.0 lb

## 2020-08-18 DIAGNOSIS — Z0000 Encounter for general adult medical examination without abnormal findings: Secondary | ICD-10-CM

## 2020-08-18 NOTE — Progress Notes (Signed)
I connected with Justin Holloway today by telephone and verified that I am speaking with the correct person using two identifiers. Location patient: home Location provider: work Persons participating in the virtual visit: Justin Holloway, Justin Durand LPN.   I discussed the limitations, risks, security and privacy concerns of performing an evaluation and management service by telephone and the availability of in person appointments. I also discussed with the patient that there may be a patient responsible charge related to this service. The patient expressed understanding and verbally consented to this telephonic visit.    Interactive audio and video telecommunications were attempted between this provider and patient, however failed, due to patient having technical difficulties OR patient did not have access to video capability.  We continued and completed visit with audio only.     Vital signs may be patient reported or missing.  Subjective:   Justin Holloway is a 87 y.o. male who presents for Medicare Annual/Subsequent preventive examination.  Review of Systems     Cardiac Risk Factors include: advanced age (>61mn, >>67women);diabetes mellitus;dyslipidemia;hypertension;male gender;obesity (BMI >30kg/m2)     Objective:    Today's Vitals   08/18/20 1117  Weight: 202 lb (91.6 kg)  Height: 5' 5"  (1.651 m)   Body mass index is 33.61 kg/m.  Advanced Directives 08/18/2020 05/10/2020 05/04/2020 05/28/2019 10/21/2018 08/29/2018 04/17/2018  Does Patient Have a Medical Advance Directive? Yes No No No No No No  Type of [place] will - - - - - -  Does patient want to make changes to medical advance directive? - - - - - - -  Copy of [place]? Yes - validated most recent copy scanned in chart (See row information) - - - - - -  Would patient like information on creating a medical advance directive? - Yes (Inpatient -  patient defers creating a medical advance directive at this time - Information given) - No - Patient declined No - Patient declined - No - Patient declined    Current Medications (verified) Outpatient Encounter Medications as of 08/18/2020  Medication Sig   Ascorbic Acid (VITAMIN C) 1000 MG tablet Take 1,000 mg by mouth daily.    aspirin EC 81 MG tablet Take 81 mg by mouth every evening.   Blood Glucose Monitoring Suppl (ONE TOUCH ULTRA 2) w/Device KIT Use as directed to check blood sugars 2 times per day dx: e11.22   cholecalciferol (VITAMIN D) 1000 units tablet Take 2,000 Units by mouth daily.    COMBIGAN 0.2-0.5 % ophthalmic solution Place 1 drop into both eyes every 12 (twelve) hours.   diclofenac Sodium (VOLTAREN) 1 % GEL Apply 4 g topically 4 (four) times daily. (Patient taking differently: Apply 2-4 g topically in the morning and at bedtime. shoulder)   furosemide (LASIX) 80 MG tablet TAKE 1 TABLET BY MOUTH IN  THE MORNING AND ONE-HALF  TABLET BY MOUTH IN THE  EARLY EVENING (Patient taking differently: Take 40-80 mg by mouth See admin instructions. 80 mg in the morning and 40 mg in the evening.)   glucose blood (ONETOUCH ULTRA) test strip USE STRIPS TO TEST BLOOD  SUGAR 3 TIMES DAILY 1 STRIP PER TEST   hydrALAZINE (APRESOLINE) 10 MG tablet Take 10 mg by mouth 3 (three) times daily.   insulin degludec (TRESIBA) 200 UNIT/ML FlexTouch Pen Inject 24 Units into the skin at bedtime.   Iron-FA-B Cmp-C-Biot-Probiotic (FUSION PLUS) CAPS Take by mouth. 1 capsule per day  Lancets (ONETOUCH DELICA PLUS [id number]) MISC 100 each by Does not apply route in the morning, at noon, and at bedtime.   LUMIGAN 0.01 % SOLN Place 1 drop into both eyes 2 (two) times daily.    Multiple Vitamin (MULTIVITAMIN WITH MINERALS) TABS tablet Take 1 tablet by mouth daily.   polyethylene glycol powder (GLYCOLAX/MIRALAX) 17 GM/SCOOP powder Take 17 g by mouth daily as needed for mild constipation.   pravastatin (PRAVACHOL) 40  MG tablet Take 1 tablet (40 mg total) by mouth daily. (Patient taking differently: Take 40 mg by mouth at bedtime.)   pregabalin (LYRICA) 25 MG capsule Take 1 capsule (25 mg total) by mouth at bedtime.   Semaglutide,0.25 or 0.5MG/DOS, (OZEMPIC, 0.25 OR 0.5 MG/DOSE,) 2 MG/1.5ML SOPN Inject 0.5 mg into the skin once a week.   tamsulosin (FLOMAX) 0.4 MG CAPS capsule Take 0.4 mg by mouth daily.   No facility-administered encounter medications on file as of 08/18/2020.    Allergies (verified) Gabapentin   History: Past Medical History:  Diagnosis Date   Arthritis    CKD (chronic kidney disease), stage IV (HCC)    Diabetes mellitus without complication ([place])    Glaucoma    "blurred vision", see Dr Gershon Crane yearly   Hyperlipidemia    Hypertension    TIA (transient ischemic attack)    Past Surgical History:  Procedure Laterality Date   INGUINAL HERNIA REPAIR Left 06/09/2013   Procedure: OPEN REPAIR LEFT INGUINAL HERNIA;  Surgeon: [name], MD;  Location: [place];  Service: General;  Laterality: Left;   INSERTION OF MESH Left 06/09/2013   Procedure: INSERTION OF MESH;  Surgeon: [name], MD;  Location: [place];  Service: General;  Laterality: Left;   Family History  Problem Relation Age of Onset   Cancer Mother 65       breast   COPD Father 42       Congestive heart failure   Social History   Socioeconomic History   Marital status: Married    Spouse name: Justin Holloway   Number of children: 8   Years of education: 12   Highest education level: Not on file  Occupational History   Occupation: retired  Tobacco Use   Smoking status: Former    Types: Cigars   Smokeless tobacco: Never   Tobacco comments:    socially, not long  Scientific laboratory technician Use: Never used  Substance and Sexual Activity   Alcohol use: Never   Drug use: Never   Sexual activity: Not Currently  Other Topics Concern   Not on file  Social History Narrative   Lives with wife at home    caffeine- coffee  1 cup daily   Social Determinants of Health   Financial Resource Strain: Low Risk    Difficulty of Paying Living Expenses: Not hard at all  Food Insecurity: No Food Insecurity   Worried About Charity fundraiser in the Last Year: Never true   [place] in the Last Year: Never true  Transportation Needs: No Transportation Needs   Lack of Transportation (Medical): No   Lack of Transportation (Non-Medical): No  Physical Activity: Insufficiently Active   Days of Exercise per Week: 3 days   Minutes of Exercise per Session: 20 min  Stress: No Stress Concern Present   Feeling of Stress : Not at all  Social Connections: Not on file    Tobacco Counseling Counseling given: Not Answered Tobacco comments: socially, not  long   Clinical Intake:  Pre-visit preparation completed: Yes  Pain : No/denies pain     Nutritional Status: BMI > 30  Obese Nutritional Risks: Nausea/ vomitting/ diarrhea (nauseous in the morning) Diabetes: Yes  How often do you need to have someone help you when you read instructions, pamphlets, or other written materials from your doctor or pharmacy?: 1 - Never What is the last grade level you completed in school?: 11th grade  Diabetic? Yes Nutrition Risk Assessment:  Has the patient had any N/V/D within the last 2 months?  Yes  Does the patient have any non-healing wounds?  No  Has the patient had any unintentional weight loss or weight gain?  No   Diabetes:  Is the patient diabetic?  Yes  If diabetic, was a CBG obtained today?  No  Did the patient bring in their glucometer from home?  No  How often do you monitor your CBG's? daily.   Financial Strains and Diabetes Management:  Are you having any financial strains with the device, your supplies or your medication? No .  Does the patient want to be seen by Chronic Care Management for management of their diabetes?  No  Would the patient like to be referred to a Nutritionist or for Diabetic  Management?  No   Diabetic Exams:  Diabetic Eye Exam: Completed 10/29/2019 Diabetic Foot Exam: Completed 10/29/2019   Interpreter Needed?: No  Information entered by :: NAllen LPN   Activities of Daily Living In your present state of health, do you have any difficulty performing the following activities: 08/18/2020 05/10/2020  Hearing? N N  Vision? N N  Difficulty concentrating or making decisions? N N  Walking or climbing stairs? Y Y  Dressing or bathing? N Y  Doing errands, shopping? N Y  Conservation officer, nature and eating ? N -  Using the Toilet? N -  In the past six months, have you accidently leaked urine? Y -  Do you have problems with loss of bowel control? N -  Managing your Medications? N -  Managing your Finances? N -  Housekeeping or managing your Housekeeping? N -  Some recent data might be hidden    Patient Care Team: [name], MD as PCP - General (Internal Medicine) Marylynn Pearson, MD as Consulting Physician (Ophthalmology) [name], [name], RN as Case Manager Mayford Knife, [facility] (Pharmacist)  Indicate any recent Medical Services you may have received from other than [facility] providers in the past year (date may be approximate).     Assessment:   This is a routine wellness examination for Justin Holloway.  Hearing/Vision screen No results found.  Dietary issues and exercise activities discussed: Current Exercise Habits: Home exercise routine, Type of exercise: stretching, Time (Minutes): 20, Frequency (Times/Week): 3, Weekly Exercise (Minutes/Week): 60   Goals Addressed             This Visit's Progress    Patient Stated       08/18/2020, no goals       Depression Screen PHQ 2/9 Scores 08/18/2020 06/09/2020 05/28/2019 04/28/2019 08/29/2018 04/17/2018 12/26/2017  PHQ - 2 Score 0 0 0 0 0 0 0  PHQ- 9 Score - 0 0 - 3 - 0    Fall Risk Fall Risk  08/18/2020 06/09/2020 05/28/2019 04/28/2019 08/29/2018  Falls in the past year? 0 1 0 0 0  Number falls in past yr: - 1 - - -   Injury with Fall? - 1 - - -  Risk  for fall due to : Impaired balance/gait;Medication side effect - Medication side effect - Impaired balance/gait;Medication side effect  Follow up Falls evaluation completed;Education provided;Falls prevention discussed - Falls evaluation completed;Education provided;Falls prevention discussed - Falls evaluation completed;Education provided;Falls prevention discussed    FALL RISK PREVENTION PERTAINING TO THE HOME:  Any stairs in or around the home? No  If so, are there any without handrails?  N/a Home free of loose throw rugs in walkways, pet beds, electrical cords, etc? Yes  Adequate lighting in your home to reduce risk of falls? Yes   ASSISTIVE DEVICES UTILIZED TO PREVENT FALLS:  Life alert? Yes  Use of a cane, walker or w/c? Yes  Grab bars in the bathroom? Yes  Shower chair or bench in shower? Yes  Elevated toilet seat or a handicapped toilet? No   TIMED UP AND GO:  Was the test performed? No .      Cognitive Function:     6CIT Screen 08/18/2020 05/28/2019 08/29/2018 12/26/2017  What Year? 0 points 0 points 0 points 0 points  What month? 0 points 0 points 0 points 0 points  What time? 0 points 0 points 0 points 0 points  Count back from 20 4 points 4 points 4 points 0 points  Months in reverse 4 points 4 points 4 points 4 points  Repeat phrase 6 points 0 points 2 points 2 points  Total Score 14 8 10 6     Immunizations Immunization History  Administered Date(s) Administered   Fluad Quad(high Dose 65+) 09/23/2019   Influenza, High Dose Seasonal PF 11/21/2017, 08/29/2018   PFIZER(Purple Top)SARS-COV-2 Vaccination 02/01/2019, 03/06/2019, 11/08/2019   Pneumococcal Polysaccharide-23 12/10/1998    TDAP status: Up to date  Flu Vaccine status: Due, Education has been provided regarding the importance of this vaccine. Advised may receive this vaccine at local pharmacy or Health Dept. Aware to provide a copy of the vaccination record if  obtained from local pharmacy or Health Dept. Verbalized acceptance and understanding.  Pneumococcal vaccine status: Up to date  Covid-19 vaccine status: Completed vaccines  Qualifies for Shingles Vaccine? Yes   Zostavax completed No   Shingrix Completed?: No.    Education has been provided regarding the importance of this vaccine. Patient has been advised to call insurance company to determine out of pocket expense if they have not yet received this vaccine. Advised may also receive vaccine at local pharmacy or Health Dept. Verbalized acceptance and understanding.  Screening Tests Health Maintenance  Topic Date Due   Zoster Vaccines- Shingrix (1 of 2) Never done   COVID-19 Vaccine (4 - Booster for Pfizer series) 02/08/2020   INFLUENZA VACCINE  08/09/2020   FOOT EXAM  10/28/2020   OPHTHALMOLOGY EXAM  10/28/2020   HEMOGLOBIN A1C  12/09/2020   TETANUS/TDAP  11/12/2022   PNA vac Low Risk Adult  Completed   HPV VACCINES  Aged Out    Health Maintenance  Health Maintenance Due  Topic Date Due   Zoster Vaccines- Shingrix (1 of 2) Never done   COVID-19 Vaccine (4 - Booster for Pfizer series) 02/08/2020   INFLUENZA VACCINE  08/09/2020    Colorectal cancer screening: No longer required.   Lung Cancer Screening: (Low Dose CT Chest recommended if Age 51-80 years, 30 pack-year currently smoking OR have quit w/in 15years.) does not qualify.   Lung Cancer Screening Referral: no  Additional Screening:  Hepatitis C Screening: does not qualify;   Vision Screening: Recommended annual ophthalmology exams for early detection of  glaucoma and other disorders of the eye. Is the patient up to date with their annual eye exam?  Yes  Who is the provider or what is the name of the office in which the patient attends annual eye exams? Dr. Venetia Maxon If pt is not established with a provider, would they like to be referred to a provider to establish care? No .   Dental Screening: Recommended annual  dental exams for proper oral hygiene  Community Resource Referral / Chronic Care Management: CRR required this visit?  No   CCM required this visit?  No      Plan:     I have personally reviewed and noted the following in the patient's chart:   Medical and social history Use of alcohol, tobacco or illicit drugs  Current medications and supplements including opioid prescriptions. Patient is not currently taking opioid prescriptions. Functional ability and status Nutritional status Physical activity Advanced directives List of other physicians Hospitalizations, surgeries, and ER visits in previous 12 months Vitals Screenings to include cognitive, depression, and falls Referrals and appointments  In addition, I have reviewed and discussed with patient certain preventive protocols, quality metrics, and best practice recommendations. A written personalized care plan for preventive services as well as general preventive health recommendations were provided to patient.     [name], LPN   10/05/6392   Nurse Notes:

## 2020-08-18 NOTE — Patient Instructions (Signed)
Justin Holloway , Thank you for taking time to come for your Medicare Wellness Visit. I appreciate your ongoing commitment to your health goals. Please review the following plan we discussed and let me know if I can assist you in the future.   Screening recommendations/referrals: Colonoscopy: not required Recommended yearly ophthalmology/optometry visit for glaucoma screening and checkup Recommended yearly dental visit for hygiene and checkup  Vaccinations: Influenza vaccine: due Pneumococcal vaccine: completed 10/25/2011 Tdap vaccine: completed 11/11/2012, due 11/12/2022 Shingles vaccine: discussed   Covid-19:  11/08/2019, 03/06/2019, 02/01/2019  Advanced directives: copy in chart  Conditions/risks identified: none  Next appointment: Follow up in one year for your annual wellness visit.   Preventive Care 50 Years and Older, Male Preventive care refers to lifestyle choices and visits with your health care provider that can promote health and wellness. What does preventive care include? A yearly physical exam. This is also called an annual well check. Dental exams once or twice a year. Routine eye exams. Ask your health care provider how often you should have your eyes checked. Personal lifestyle choices, including: Daily care of your teeth and gums. Regular physical activity. Eating a healthy diet. Avoiding tobacco and drug use. Limiting alcohol use. Practicing safe sex. Taking low doses of aspirin every day. Taking vitamin and mineral supplements as recommended by your health care provider. What happens during an annual well check? The services and screenings done by your health care provider during your annual well check will depend on your age, overall health, lifestyle risk factors, and family history of disease. Counseling  Your health care provider may ask you questions about your: Alcohol use. Tobacco use. Drug use. Emotional well-being. Home and relationship  well-being. Sexual activity. Eating habits. History of falls. Memory and ability to understand (cognition). Work and work Statistician. Screening  You may have the following tests or measurements: Height, weight, and BMI. Blood pressure. Lipid and cholesterol levels. These may be checked every 5 years, or more frequently if you are over 13 years old. Skin check. Lung cancer screening. You may have this screening every year starting at age 22 if you have a 30-pack-year history of smoking and currently smoke or have quit within the past 15 years. Fecal occult blood test (FOBT) of the stool. You may have this test every year starting at age 79. Flexible sigmoidoscopy or colonoscopy. You may have a sigmoidoscopy every 5 years or a colonoscopy every 10 years starting at age 16. Prostate cancer screening. Recommendations will vary depending on your family history and other risks. Hepatitis C blood test. Hepatitis B blood test. Sexually transmitted disease (STD) testing. Diabetes screening. This is done by checking your blood sugar (glucose) after you have not eaten for a while (fasting). You may have this done every 1-3 years. Abdominal aortic aneurysm (AAA) screening. You may need this if you are a current or former smoker. Osteoporosis. You may be screened starting at age 44 if you are at high risk. Talk with your health care provider about your test results, treatment options, and if necessary, the need for more tests. Vaccines  Your health care provider may recommend certain vaccines, such as: Influenza vaccine. This is recommended every year. Tetanus, diphtheria, and acellular pertussis (Tdap, Td) vaccine. You may need a Td booster every 10 years. Zoster vaccine. You may need this after age 32. Pneumococcal 13-valent conjugate (PCV13) vaccine. One dose is recommended after age 81. Pneumococcal polysaccharide (PPSV23) vaccine. One dose is recommended after age 84. Talk to  your health care  provider about which screenings and vaccines you need and how often you need them. This information is not intended to replace advice given to you by your health care provider. Make sure you discuss any questions you have with your health care provider. Document Released: 01/22/2015 Document Revised: 09/15/2015 Document Reviewed: 10/27/2014 Elsevier Interactive Patient Education  2017 [place] Prevention in the Home Falls can cause injuries. They can happen to people of all ages. There are many things you can do to make your home safe and to help prevent falls. What can I do on the outside of my home? Regularly fix the edges of walkways and driveways and fix any cracks. Remove anything that might make you trip as you walk through a door, such as a raised step or threshold. Trim any bushes or trees on the path to your home. Use bright outdoor lighting. Clear any walking paths of anything that might make someone trip, such as rocks or tools. Regularly check to see if handrails are loose or broken. Make sure that both sides of any steps have handrails. Any raised decks and porches should have guardrails on the edges. Have any leaves, snow, or ice cleared regularly. Use sand or salt on walking paths during winter. Clean up any spills in your garage right away. This includes oil or grease spills. What can I do in the bathroom? Use night lights. Install grab bars by the toilet and in the tub and shower. Do not use towel bars as grab bars. Use non-skid mats or decals in the tub or shower. If you need to sit down in the shower, use a plastic, non-slip stool. Keep the floor dry. Clean up any water that spills on the floor as soon as it happens. Remove soap buildup in the tub or shower regularly. Attach bath mats securely with double-sided non-slip rug tape. Do not have throw rugs and other things on the floor that can make you trip. What can I do in the bedroom? Use night lights. Make  sure that you have a light by your bed that is easy to reach. Do not use any sheets or blankets that are too big for your bed. They should not hang down onto the floor. Have a firm chair that has side arms. You can use this for support while you get dressed. Do not have throw rugs and other things on the floor that can make you trip. What can I do in the kitchen? Clean up any spills right away. Avoid walking on wet floors. Keep items that you use a lot in easy-to-reach places. If you need to reach something above you, use a strong step stool that has a grab bar. Keep electrical cords out of the way. Do not use floor polish or wax that makes floors slippery. If you must use wax, use non-skid floor wax. Do not have throw rugs and other things on the floor that can make you trip. What can I do with my stairs? Do not leave any items on the stairs. Make sure that there are handrails on both sides of the stairs and use them. Fix handrails that are broken or loose. Make sure that handrails are as long as the stairways. Check any carpeting to make sure that it is firmly attached to the stairs. Fix any carpet that is loose or worn. Avoid having throw rugs at the top or bottom of the stairs. If you do have throw rugs, attach  them to the floor with carpet tape. Make sure that you have a light switch at the top of the stairs and the bottom of the stairs. If you do not have them, ask someone to add them for you. What else can I do to help prevent falls? Wear shoes that: Do not have high heels. Have rubber bottoms. Are comfortable and fit you well. Are closed at the toe. Do not wear sandals. If you use a stepladder: Make sure that it is fully opened. Do not climb a closed stepladder. Make sure that both sides of the stepladder are locked into place. Ask someone to hold it for you, if possible. Clearly mark and make sure that you can see: Any grab bars or handrails. First and last steps. Where the  edge of each step is. Use tools that help you move around (mobility aids) if they are needed. These include: Canes. Walkers. Scooters. Crutches. Turn on the lights when you go into a dark area. Replace any light bulbs as soon as they burn out. Set up your furniture so you have a clear path. Avoid moving your furniture around. If any of your floors are uneven, fix them. If there are any pets around you, be aware of where they are. Review your medicines with your doctor. Some medicines can make you feel dizzy. This can increase your chance of falling. Ask your doctor what other things that you can do to help prevent falls. This information is not intended to replace advice given to you by your health care provider. Make sure you discuss any questions you have with your health care provider. Document Released: 10/22/2008 Document Revised: 06/03/2015 Document Reviewed: 01/30/2014 Elsevier Interactive Patient Education  2017 [employer].

## 2020-08-19 ENCOUNTER — Other Ambulatory Visit: Payer: Self-pay | Admitting: Nurse Practitioner

## 2020-08-19 ENCOUNTER — Other Ambulatory Visit: Payer: Self-pay

## 2020-08-19 DIAGNOSIS — E0841 Diabetes mellitus due to underlying condition with diabetic mononeuropathy: Secondary | ICD-10-CM

## 2020-08-19 MED ORDER — PREGABALIN 25 MG PO CAPS: 25.0000 mg | ORAL_CAPSULE | Freq: Every day | ORAL | 0 refills | Status: DC

## 2020-08-22 DIAGNOSIS — I700 Atherosclerosis of aorta: Secondary | ICD-10-CM | POA: Insufficient documentation

## 2020-08-22 DIAGNOSIS — R2689 Other abnormalities of gait and mobility: Secondary | ICD-10-CM | POA: Insufficient documentation

## 2020-08-25 ENCOUNTER — Encounter: Payer: Self-pay | Admitting: Neurology

## 2020-08-26 ENCOUNTER — Other Ambulatory Visit: Payer: Self-pay

## 2020-08-26 MED ORDER — ONETOUCH ULTRA VI STRP: ORAL_STRIP | 3 refills | Status: DC

## 2020-08-26 MED ORDER — ONETOUCH ULTRA 2 W/DEVICE KIT: PACK | 0 refills | Status: DC

## 2020-08-31 ENCOUNTER — Ambulatory Visit
Admission: RE | Admit: 2020-08-31 | Discharge: 2020-08-31 | Disposition: A | Discharge status: Home / Self Care | Payer: Medicare Other | Source: Ambulatory Visit | Attending: Internal Medicine | Admitting: Internal Medicine

## 2020-08-31 ENCOUNTER — Other Ambulatory Visit: Payer: Self-pay

## 2020-08-31 DIAGNOSIS — D329 Benign neoplasm of meninges, unspecified: Secondary | ICD-10-CM | POA: Diagnosis not present

## 2020-08-31 DIAGNOSIS — I639 Cerebral infarction, unspecified: Secondary | ICD-10-CM | POA: Diagnosis not present

## 2020-08-31 DIAGNOSIS — G939 Disorder of brain, unspecified: Secondary | ICD-10-CM | POA: Diagnosis not present

## 2020-08-31 DIAGNOSIS — R2689 Other abnormalities of gait and mobility: Secondary | ICD-10-CM

## 2020-08-31 DIAGNOSIS — I739 Peripheral vascular disease, unspecified: Secondary | ICD-10-CM | POA: Diagnosis not present

## 2020-09-01 ENCOUNTER — Telehealth: Payer: Self-pay

## 2020-09-01 NOTE — Chronic Care Management (AMB) (Signed)
Chronic Care Management Pharmacy Assistant   Name: Justin Holloway  MRN: [id number] DOB: Jun 16, 1929   Reason for Encounter: Disease State/ Hypertension  Recent office visits:  06-09-2020 Justin Castilla, NP. STOP Tramadol. A1C= 7.3. RBC= 3.48, Hemo= 10.3, Hema= 31.6. Glucose= 223, BUN= 65, Creatinine= 3.06, eGFR= 19. Abnormal UA.  06-10-2020 Little, Justin Stapler, RN (CCM)  07-26-2020 Justin Holloway Justin Stapler, RN (CCM)  08-17-2020 Justin Chard, MD. Referral to neurology. MR brain order placed.  08-18-2020 Justin Simmering, LPN. Medicare wellness  Recent consult visits:  None  Hospital visits:  Medication Reconciliation was completed by comparing discharge summary, patient's EMR and Pharmacy list, and upon discussion with patient.   Admitted to the hospital on 05-10-2020 due to acute renal failure . Discharge date was 05-18-2020. Discharged from [facility]Medications Started at [facility] Discharge:?? Tramadol 24m every 6 hours as needed   Medication Changes at Hospital Discharge: Take Lyrica 271mat bed time   Medications Discontinued at Hospital Discharge: None   Medications that remain the same after Hospital Discharge:??  -All other medications will remain the same.     Hospital visits:  Medication Reconciliation was completed by comparing discharge summary, patient's EMR and Pharmacy list, and upon discussion with patient.   Admitted to the hospital on 05-04-2020 due to acute renal failure . Discharge date was 05-04-2020[facility] 4 g four times daily Morphine 0.5 mg every 4 hours as needed   Medication Changes at Hospital Discharge: None   Medications Discontinued at Hospital Discharge: None   Medications that remain the same after Hospital Discharge:??  -All other medications will remain the same.    Medications: Outpatient Encounter  Medications as of 09/01/2020  Medication Sig   Ascorbic Acid (VITAMIN C) 1000 MG tablet Take 1,000 mg by mouth daily.    aspirin EC 81 MG tablet Take 81 mg by mouth every evening.   Blood Glucose Monitoring Suppl (ONE TOUCH ULTRA 2) w/Device KIT Use as directed to check blood sugars 2 times per day dx: e11.22   cholecalciferol (VITAMIN D) 1000 units tablet Take 2,000 Units by mouth daily.    COMBIGAN 0.2-0.5 % ophthalmic solution Place 1 drop into both eyes every 12 (twelve) hours.   diclofenac Sodium (VOLTAREN) 1 % GEL Apply 4 g topically 4 (four) times daily. (Patient taking differently: Apply 2-4 g topically in the morning and at bedtime. shoulder)   furosemide (LASIX) 80 MG tablet TAKE 1 TABLET BY MOUTH IN  THE MORNING AND ONE-HALF  TABLET BY MOUTH IN THE  EARLY EVENING (Patient taking differently: Take 40-80 mg by mouth See admin instructions. 80 mg in the morning and 40 mg in the evening.)   glucose blood (ONETOUCH ULTRA) test strip USE STRIPS TO TEST BLOOD  SUGAR 3 TIMES DAILY 1 STRIP PER TEST   hydrALAZINE (APRESOLINE) 10 MG tablet Take 10 mg by mouth 3 (three) times daily.   insulin degludec (TRESIBA) 200 UNIT/ML FlexTouch Pen Inject 24 Units into the skin at bedtime.   Iron-FA-B Cmp-C-Biot-Probiotic (FUSION PLUS) CAPS Take by mouth. 1 capsule per day   Lancets (ONETOUCH DELICA PLUS LAGYIRSW54OMISC 100 each by Does not apply route in the morning, at noon, and at bedtime.   LUMIGAN 0.01 % SOLN Place 1 drop into both eyes 2 (two) times daily.    Multiple Vitamin (MULTIVITAMIN WITH MINERALS) TABS tablet Take  1 tablet by mouth daily.   polyethylene glycol powder (GLYCOLAX/MIRALAX) 17 GM/SCOOP powder Take 17 g by mouth daily as needed for mild constipation.   pravastatin (PRAVACHOL) 40 MG tablet Take 1 tablet (40 mg total) by mouth daily. (Patient taking differently: Take 40 mg by mouth at bedtime.)   pregabalin (LYRICA) 25 MG capsule Take 1 capsule (25 mg total) by mouth at bedtime.    Semaglutide,0.25 or 0.5MG/DOS, (OZEMPIC, 0.25 OR 0.5 MG/DOSE,) 2 MG/1.5ML SOPN Inject 0.5 mg into the skin once a week.   tamsulosin (FLOMAX) 0.4 MG CAPS capsule Take 0.4 mg by mouth daily.   No facility-administered encounter medications on file as of 09/01/2020.   Reviewed chart prior to disease state call. Spoke with patient regarding BP  Recent Office Vitals: BP Readings from Last 3 Encounters:  08/17/20 122/60  06/09/20 110/68  05/18/20 112/62   Pulse Readings from Last 3 Encounters:  08/17/20 66  06/09/20 68  05/18/20 86    Wt Readings from Last 3 Encounters:  08/18/20 202 lb (91.6 kg)  08/17/20 203 lb 9.6 oz (92.4 kg)  06/09/20 199 lb (90.3 kg)     Kidney Function Lab Results  Component Value Date/Time   CREATININE 3.06 (H) 06/09/2020 05:22 PM   CREATININE 2.83 (H) 05/15/2020 02:01 AM   GFRNONAA 21 (L) 05/15/2020 02:01 AM   GFRAA 19 (L) 01/14/2020 04:59 PM    BMP Latest Ref Rng & Units 06/09/2020 05/15/2020 05/14/2020  Glucose 65 - 99 mg/dL 223(H) 153(H) 142(H)  BUN 8 - 27 mg/dL 65(H) 77(H) 76(H)  Creatinine 0.76 - 1.27 mg/dL 3.06(H) 2.83(H) 3.16(H)  BUN/Creat Ratio 10 - 24 21 - -  Sodium 134 - 144 mmol/L 138 138 137  Potassium 3.5 - 5.2 mmol/L 4.6 3.4(L) 3.9  Chloride 96 - 106 mmol/L 98 107 106  CO2 20 - 29 mmol/L 21 24 22   Calcium 8.6 - 10.2 mg/dL 8.8 8.4(L) 8.3(L)    Current antihypertensive regimen:  Hydralazine 10 mg 3 times daily  How often are you checking your Blood Pressure? infrequently Current home BP readings: Patient states he doesn't have any readings What recent interventions/DTPs have been made by any provider to improve Blood Pressure control since last CPP Visit:  Educated on Exercise goal of 150 minutes per week; Counseled to monitor BP at home at least three times per week, document, and provide log at future appointments  Any recent hospitalizations or ED visits since last visit with CPP? No  What diet changes have been made to improve  Blood Pressure Control?  Patient states he has limited his salt intake. Patient states he eats plenty of vegetables/ fruits and drinks a lot of water.  What exercise is being done to improve your Blood Pressure Control?  Patient states he continues doing the same exercises from physical therapy.  Adherence Review: Is the patient currently on ACE/ARB medication? No Does the patient have >5 day gap between last estimated fill dates? No   Care Gaps: Shingrix overdue Covid booster overdue 09-15-2021 medicare wellness  RAF= 3.135%  Star Rating Drugs: Ozempic 0.5 mg- Patient Assistance Pravastatin 40 mg- Last filled 08-26-2020 [address] [place] Clinical Pharmacist Assistant [phone]

## 2020-09-03 DIAGNOSIS — G8929 Other chronic pain: Secondary | ICD-10-CM | POA: Diagnosis not present

## 2020-09-03 DIAGNOSIS — I639 Cerebral infarction, unspecified: Secondary | ICD-10-CM | POA: Diagnosis not present

## 2020-09-03 DIAGNOSIS — S42201D Unspecified fracture of upper end of right humerus, subsequent encounter for fracture with routine healing: Secondary | ICD-10-CM | POA: Diagnosis not present

## 2020-09-03 DIAGNOSIS — E1122 Type 2 diabetes mellitus with diabetic chronic kidney disease: Secondary | ICD-10-CM | POA: Diagnosis not present

## 2020-09-16 ENCOUNTER — Encounter: Payer: Medicare Other | Admitting: Internal Medicine

## 2020-09-20 ENCOUNTER — Telehealth: Payer: Medicare Other

## 2020-09-20 ENCOUNTER — Ambulatory Visit (INDEPENDENT_AMBULATORY_CARE_PROVIDER_SITE_OTHER): Payer: Medicare Other

## 2020-09-20 DIAGNOSIS — E785 Hyperlipidemia, unspecified: Secondary | ICD-10-CM

## 2020-09-20 DIAGNOSIS — R2689 Other abnormalities of gait and mobility: Secondary | ICD-10-CM

## 2020-09-20 DIAGNOSIS — I129 Hypertensive chronic kidney disease with stage 1 through stage 4 chronic kidney disease, or unspecified chronic kidney disease: Secondary | ICD-10-CM

## 2020-09-20 DIAGNOSIS — N184 Chronic kidney disease, stage 4 (severe): Secondary | ICD-10-CM

## 2020-09-20 DIAGNOSIS — R269 Unspecified abnormalities of gait and mobility: Secondary | ICD-10-CM

## 2020-09-20 DIAGNOSIS — E0841 Diabetes mellitus due to underlying condition with diabetic mononeuropathy: Secondary | ICD-10-CM

## 2020-09-20 DIAGNOSIS — E1122 Type 2 diabetes mellitus with diabetic chronic kidney disease: Secondary | ICD-10-CM

## 2020-09-20 NOTE — Patient Instructions (Signed)
Goals Addressed       Patient Stated     Maintain adequate kidney function (pt-stated)   On track     Timeframe:  Long-Range Goal Priority:  High Start Date:  01/15/20                         Expected End Date: 01/14/21  Next Follow up date: 11/17/20  Continue to self administer medications as prescribed Attend all scheduled provider appointments Call pharmacy for medication refills Call provider office for new concerns or questions Keep all MD f/u appointments with Dr. Carolin Sicks as scheduled  Adhere to water intake as directed by kidney doctor                   Other     Constipation - complications prevented or minimized        Timeframe:  Long-Range Goal Priority:  High Start Date:  07/26/20                           Expected End Date:  01/27/20  Next Scheduled follow up: 11/17/20         Self Care Activities:  Self administers medications as prescribed Attends all scheduled provider appointments Calls pharmacy for medication refills Calls provider office for new concerns or questions Patient Goals: - increase fiber intake - increase water intake - stay active by adhering to HEP - participate in home bowel training regimen as discussed                     Diabetes - Glycemic Management Optimized   On track     Timeframe:  Long-Range Goal Priority:  High Start Date:  06/10/20                           Expected End Date:  06/10/21  Next Scheduled Follow up: 11/17/20            Self-Care Activities Self administers oral medications as prescribed Self administers insulin as prescribed Self administers injectable DM medication (Ozempic) as prescribed Attends all scheduled provider appointments Checks blood sugars as prescribed and utilize hyper and hypoglycemia protocol as needed Adheres to prescribed ADA/carb modified Patient Goals: - check blood sugar at prescribed times - check blood sugar before and after exercise - check blood sugar if I feel it is too high or too  low - enter blood sugar readings and medication or insulin into daily log - take the blood sugar log to all doctor visits - take the blood sugar meter to all doctor visits - set a realistic goal -       Hyperlipidemia - disease progression prevented or minimized   On track     Timeframe:  Long-Range Goal Priority:  Medium Start Date:  07/26/20                           Expected End Date:  07/26/21  Next Scheduled follow up: 11/17/20    Self Care Activities:  Self administers medications as prescribed Attends all scheduled provider appointments Calls pharmacy for medication refills Calls provider office for new concerns or questions Patient Goals: - to adhere to dietary and exercise recommendations  Impaired Gait evaluated and treated   On track     Timeframe:  Long-Range Goal Priority:  High Start Date: 09/20/20                            Expected End Date: 03/20/21  Next Scheduled follow up: 11/17/20           Self-Care Deficits:  - Impaired Gait Patient Goals:  - Utilize walker appropriately with all ambulation - De-clutter walkways - Change positions slowly - Wear secure fitting shoes at all times with ambulation - Utilize home lighting for dim lit areas - Demonstrate self and pet awareness at all times                  Obtain Eye Exam - Diabetes        Timeframe:  Long-Range Goal Priority:  Medium Start Date:  06/10/20                           Expected End Date: 06/10/21   Next Scheduled Follow up call: 11/922     Self-Care Activities Self administers oral medications as prescribed Self administers insulin as prescribed Self administers injectable DM medication (Ozempic) as prescribed Attends all scheduled provider appointments Checks blood sugars as prescribed and utilize hyper and hypoglycemia protocol as needed Adheres to prescribed ADA/carb modified Patient Goals:  - keep appointment with eye doctor - schedule appointment with eye  doctor       Obtain Foot Exam - Diabetes        Timeframe:  Long-Range Goal Priority:  Medium Start Date:  06/10/20                          Expected End Date:  06/11/21  Next Scheduled Follow up call: 11/17/20        Self-Care Activities Self administers oral medications as prescribed Self administers insulin as prescribed Self administers injectable DM medication (Ozempic) as prescribed Attends all scheduled provider appointments Checks blood sugars as prescribed and utilize hyper and hypoglycemia protocol as needed Adheres to prescribed ADA/carb modified Patient Goals: - check feet daily for cuts, sores or redness - do heel pump exercise 2 to 3 times each day - keep feet up while sitting - trim toenails straight across - wash and dry feet carefully every day - wear comfortable, cotton socks - wear comfortable, well-fitting shoes                   Vitamin D deficiency - treatment optimized        Timeframe:  Long-Range Goal Priority:  Medium Start Date:  07/26/20                           Expected End Date: 07/26/21  Next Scheduled follow up: 11/17/20          Self Care Activities:  Self administers medications as prescribed Attends all scheduled provider appointments Calls pharmacy for medication refills Calls provider office for new concerns or questions Patient Goals: - get at least 15 minutes of natural sunlight daily when possible - eat Vitamin D rich foods

## 2020-09-20 NOTE — Chronic Care Management (AMB) (Signed)
Chronic Care Management   CCM RN Visit Note  09/20/2020 Name: Justin Holloway MRN: [id number] DOB: 23-Jul-1929  Subjective: Justin Holloway is a 87 y.o. year old male who is a primary care patient of [name], MD. The care management team was consulted for assistance with disease management and care coordination needs.    Engaged with patient by telephone for follow up visit in response to provider referral for case management and/or care coordination services.   Consent to Services:  The patient was given information about Chronic Care Management services, agreed to services, and gave verbal consent prior to initiation of services.  Please see initial visit note for detailed documentation.   Patient agreed to services and verbal consent obtained.   Assessment: Review of patient past medical history, allergies, medications, health status, including review of consultants reports, laboratory and other test data, was performed as part of comprehensive evaluation and provision of chronic care management services.   SDOH (Social Determinants of Health) assessments and interventions performed:  Yes, no acute challenges   CCM Care Plan  Allergies  Allergen Reactions   Gabapentin Itching    Outpatient Encounter Medications as of 09/20/2020  Medication Sig   Ascorbic Acid (VITAMIN C) 1000 MG tablet Take 1,000 mg by mouth daily.    aspirin EC 81 MG tablet Take 81 mg by mouth every evening.   Blood Glucose Monitoring Suppl (ONE TOUCH ULTRA 2) w/Device KIT Use as directed to check blood sugars 2 times per day dx: e11.22   cholecalciferol (VITAMIN D) 1000 units tablet Take 2,000 Units by mouth daily.    COMBIGAN 0.2-0.5 % ophthalmic solution Place 1 drop into both eyes every 12 (twelve) hours.   diclofenac Sodium (VOLTAREN) 1 % GEL Apply 4 g topically 4 (four) times daily. (Patient taking differently: Apply 2-4 g topically in the morning and at bedtime. shoulder)   furosemide (LASIX) 80  MG tablet TAKE 1 TABLET BY MOUTH IN  THE MORNING AND ONE-HALF  TABLET BY MOUTH IN THE  EARLY EVENING (Patient taking differently: Take 40-80 mg by mouth See admin instructions. 80 mg in the morning and 40 mg in the evening.)   glucose blood (ONETOUCH ULTRA) test strip USE STRIPS TO TEST BLOOD  SUGAR 3 TIMES DAILY 1 STRIP PER TEST   hydrALAZINE (APRESOLINE) 10 MG tablet Take 10 mg by mouth 3 (three) times daily.   insulin degludec (TRESIBA) 200 UNIT/ML FlexTouch Pen Inject 24 Units into the skin at bedtime.   Iron-FA-B Cmp-C-Biot-Probiotic (FUSION PLUS) CAPS Take by mouth. 1 capsule per day   Lancets (ONETOUCH DELICA PLUS [id number]) MISC 100 each by Does not apply route in the morning, at noon, and at bedtime.   LUMIGAN 0.01 % SOLN Place 1 drop into both eyes 2 (two) times daily.    Multiple Vitamin (MULTIVITAMIN WITH MINERALS) TABS tablet Take 1 tablet by mouth daily.   polyethylene glycol powder (GLYCOLAX/MIRALAX) 17 GM/SCOOP powder Take 17 g by mouth daily as needed for mild constipation.   pravastatin (PRAVACHOL) 40 MG tablet Take 1 tablet (40 mg total) by mouth daily. (Patient taking differently: Take 40 mg by mouth at bedtime.)   pregabalin (LYRICA) 25 MG capsule Take 1 capsule (25 mg total) by mouth at bedtime.   Semaglutide,0.25 or 0.5MG/DOS, (OZEMPIC, 0.25 OR 0.5 MG/DOSE,) 2 MG/1.5ML SOPN Inject 0.5 mg into the skin once a week.   tamsulosin (FLOMAX) 0.4 MG CAPS capsule Take 0.4 mg by mouth daily.   No facility-administered  encounter medications on file as of 09/20/2020.    Patient Active Problem List   Diagnosis Date Noted   Atherosclerosis of aorta ([place]) 08/22/2020   Shuffling gait 08/22/2020   Generalized weakness 05/10/2020   Failure to thrive in adult 05/10/2020   Chronic pain 05/10/2020   Constipation 05/10/2020   Class 1 drug-induced obesity with body mass index (BMI) of 34.0 to 34.9 in adult 05/10/2020   DNR (do not resuscitate) 05/10/2020   Hypertension    Hyperlipidemia     Glaucoma    CKD (chronic kidney disease), stage IV ([place])    Secondary renal hyperparathyroidism ([place]) 01/14/2020   Type 2 diabetes mellitus with stage 4 chronic kidney disease, with long-term current use of insulin ([place]) 09/10/2019   Peripheral vascular disease, unspecified ([place]) 05/28/2019   Diabetic neuropathy ([place]) 02/18/2019   Left upper quadrant pain 09/04/2018   Acute midline low back pain without sciatica 09/04/2018   CKD (chronic kidney disease) stage 4, GFR 15-29 ml/min ([facility]) 11/21/2017   Hypertensive nephropathy 11/21/2017   Right foot pain 11/21/2017   Vertigo 09/09/2017   BPPV (benign paroxysmal positional vertigo), right 09/06/2017   Sensorineural hearing loss (SNHL), bilateral 09/06/2017   Ataxia 02/06/2017   UTI (lower urinary tract infection) 08/07/2015   Dizziness 08/07/2015   Acute renal failure superimposed on stage 3 chronic kidney disease ([place]) 08/07/2015   Hyperkalemia 08/07/2015   Left inguinal hernia 05/15/2013   Diabetes mellitus without complication ([place]) 54/00/8676   HLD (hyperlipidemia) 03/08/2006   HYPERTENSION, BENIGN SYSTEMIC 03/08/2006   IMPOTENCE, 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   PROTEINURIA 03/08/2006    Conditions to be addressed/monitored: DM, CKD Stage 4, Hypertensive Nephropathy, Diabetic mononeuropathy associated with type 2 Diabetes, Vitamin D deficiency, HLD  Care Plan : Chronic Kidney disease  Updates made by Lynne Logan, RN since 09/20/2020 12:00 AM     Problem: Chronic Kidney Disease   Priority: High     Long-Range Goal: Maintain Adequate Kidney Function   Start Date: 01/15/2020  Expected End Date: 01/14/2021  Recent Progress: On track  Priority: High  Note:   Objective:  Lab Results  Component Value Date   HGBA1C 7.3 (H) 06/09/2020   Lab Results  Component Value Date   CREATININE 3.06 (H) 06/09/2020   CREATININE 2.83 (H) 05/15/2020   CREATININE 3.16 (H) 05/14/2020   Lab Results  Component Value Date   EGFR 19 (L) 06/09/2020   Current Barriers:  Knowledge Deficits related to disease process and treatment mangement for renal insufficiency/CKD Chronic Disease Management support and education needs related to DM, CKD Stage 4, Hypertensive Nephropathy Nurse Case Manager Clinical Goal(s):   Patient will continue to work with the CCM team and PCP for disease education and support for improved Self Health management of stage 4 CKD CCM RN CM Interventions:  09/20/20 completed successful inbound call with patient  Provided education to patient about basic disease process related to Chronic Kidney disease  Review of patient status, including review of consultant's reports, relevant laboratory and other test results, and medications completed. Reviewed medications with patient and discussed importance of medication adherence Determined patient continues to be under the care of Dr. Francina Ames patient on water intake unless otherwise directed by Dr. Carolin Sicks, Educated on dietary recommendations  Confirmed patient received the printed educational materials related to Eating Right with Chronic Kidney disease Discussed plans with patient for ongoing care management follow up and provided patient with direct contact information for care management team Patient Goals/ Self Care  Activities:  Continue to adhere to MD recommendations for CKD  Continue to keep all scheduled follow up appointments Take medications as directed  Let your healthcare team know if you are unable to take your medications Call your pharmacy for refills at least 7 days prior to running out of medication Increase your water intake unless otherwise directed  Follow Up Plan: Telephone follow up appointment with care management team member scheduled for: 11/17/20    Care Plan : Diabetes Type 2 (Adult)  Updates made by Lynne Logan, RN since 09/20/2020 12:00 AM     Problem: Glycemic Management (Diabetes, Type 2)   Priority: High     Long-Range  Goal: Glycemic Management Optimized   Start Date: 06/10/2020  Expected End Date: 06/10/2021  Recent Progress: On track  Priority: High  Note:   Objective:  Lab Results  Component Value Date   HGBA1C 7.3 (H) 06/09/2020   Lab Results  Component Value Date   CREATININE 3.06 (H) 06/09/2020   CREATININE 2.83 (H) 05/15/2020   CREATININE 3.16 (H) 05/14/2020   Lab Results  Component Value Date   EGFR 19 (L) 06/09/2020   Current Barriers:  Knowledge Deficits related to basic Diabetes pathophysiology and self care/management Knowledge Deficits related to medications used for management of diabetes Difficulty obtaining or cannot afford medications Case Manager Clinical Goal(s):  patient will demonstrate improved adherence to prescribed treatment plan for diabetes self care/management as evidenced by: daily monitoring and recording of CBG  adherence to ADA/ carb modified diet exercise 5 days/week adherence to prescribed medication regimen contacting provider for new or worsened symptoms or questions Interventions:  09/20/20 completed successful inbound call from patient  Collaboration with [name], MD regarding development and update of comprehensive plan of care as evidenced by provider attestation and co-signature Inter-disciplinary care team collaboration (see longitudinal plan of care) Provided education to patient about basic DM disease process Review of patient status, including review of consultants reports, relevant laboratory and other test results, and medications completed. Reviewed medications with patient and discussed importance of medication adherence, patient reports adherence Educated patient on dietary and exercise recommendations; daily glycemic control FBS 80-130, <180 after meals;15'15' rule Determined patient continues to comply with self monitoring his cbg's with readings over the past week running in low 80's-106 average Reviewed and discussed persistent and  worsening Diabetic Neuropathy and PCP plan of care, patient reports getting some effectiveness since starting Pregabalin  Reviewed scheduled/upcoming provider appointments including: Neurology follow up for Neuropathy and Impaired Gait, scheduled for 11/16/20 with Dr. Tomi Likens Mailed printed educational materials related to Carb Choices; Low Carb Smoothies  Discussed plans with patient for ongoing care management follow up and provided patient with direct contact information for care management team Self-Care Activities Self administers oral medications as prescribed Self administers insulin as prescribed Self administers injectable DM medication (Ozempic) as prescribed Attends all scheduled provider appointments Checks blood sugars as prescribed and utilize hyper and hypoglycemia protocol as needed Adheres to prescribed ADA/carb modified Patient Goals: - check blood sugar at prescribed times - check blood sugar before and after exercise - check blood sugar if I feel it is too high or too low - enter blood sugar readings and medication or insulin into daily log - take the blood sugar log to all doctor visits - take the blood sugar meter to all doctor visits - set a realistic goal - keep appointment with eye doctor - schedule appointment with eye doctor - check feet daily for  cuts, sores or redness - do heel pump exercise 2 to 3 times each day - keep feet up while sitting - trim toenails straight across - wash and dry feet carefully every day - wear comfortable, cotton socks - wear comfortable, well-fitting shoes  Follow Up Plan: Telephone follow up appointment with care management team member scheduled for: 09/20/20    Care Plan : Vitamin D deficiency  Updates made by Lynne Logan, RN since 09/20/2020 12:00 AM     Problem: Disease Progression   Priority: Medium     Long-Range Goal: Treatment optimized   Start Date: 07/26/2020  Expected End Date: 07/26/2021  Recent Progress: On  track  Priority: Medium  Note:   Current Barriers:  Ineffective Self Health Maintenance in a patient with  Vitamin D deficiency  Clinical Goal(s):  Collaboration with [name], MD regarding development and update of comprehensive plan of care as evidenced by provider attestation and co-signature Inter-disciplinary care team collaboration (see longitudinal plan of care) patient will work with care management team to address care coordination and chronic disease management needs related to Disease Management Educational Needs Care Coordination Medication Management and Education Psychosocial Support   Interventions:  07/26/20 completed successful outbound call with patient  Evaluation of current treatment plan related to  Vitamin D deficiency , self-management and patient's adherence to plan as established by provider. Collaboration with [name], MD regarding development and update of comprehensive plan of care as evidenced by provider attestation       and co-signature Inter-disciplinary care team collaboration (see longitudinal plan of care) Provided education to patient about basic disease process for Vitamin D deficiency Review of patient status, including review of consultant's reports, relevant laboratory and other test results, and medications completed. Reviewed medications with patient and discussed importance of medication adherence Educated patient on dietary recommendations, get at least 15 minutes of natural sunlight daily when possible Discussed plans with patient for ongoing care management follow up and provided patient with direct contact information for care management team Self Care Activities:  Self administers medications as prescribed Attends all scheduled provider appointments Calls pharmacy for medication refills Calls provider office for new concerns or questions Patient Goals: - get at least 15 minutes of natural sunlight daily when possible - eat  Vitamin D rich foods   Follow Up Plan: Telephone follow up appointment with care management team member scheduled for: 11/17/20     Care Plan : Hyperlipidemia  Updates made by Lynne Logan, RN since 09/20/2020 12:00 AM     Problem: Hyperlipidemia   Priority: Medium     Long-Range Goal: Hyperlipidemia - disease progression prevented or minimized   Start Date: 07/26/2020  Expected End Date: 07/26/2021  Recent Progress: On track  Priority: Medium  Note:   Current Barriers:  Ineffective Self Health Maintenance in a patient with  Hyperlipdemia  Clinical Goal(s):  Collaboration with [name], MD regarding development and update of comprehensive plan of care as evidenced by provider attestation and co-signature Inter-disciplinary care team collaboration (see longitudinal plan of care) patient will work with care management team to address care coordination and chronic disease management needs related to Disease Management Educational Needs Care Coordination Medication Management and Education Psychosocial Support   Interventions:  09/20/20 completed successful outbound call with patient  Evaluation of current treatment plan related to  Hyperlipidemia  , self-management and patient's adherence to plan as established by provider. Collaboration with [name], MD regarding development and update of comprehensive  plan of care as evidenced by provider attestation       and co-signature Inter-disciplinary care team collaboration (see longitudinal plan of care) Provided education to patient about basic disease process related to Hyperlipidemia  Review of patient status, including review of consultant's reports, relevant laboratory and other test results, and medications completed. Reviewed medications with patient and discussed importance of medication adherence Educated patient on dietary and exercise recommendations  Mailed printed educational material related to Cooking to Lower  High Cholesterol  Discussed plans with patient for ongoing care management follow up and provided patient with direct contact information for care management team Self Care Activities:  Self administers medications as prescribed Attends all scheduled provider appointments Calls pharmacy for medication refills Calls provider office for new concerns or questions Patient Goals: - to adhere to dietary and exercise recommendations   Follow Up Plan: Telephone follow up appointment with care management team member scheduled for: 11/17/20     Care Plan : Constipation  Updates made by Lynne Logan, RN since 09/20/2020 12:00 AM     Problem: Constipation   Priority: High     Long-Range Goal: Constipation - complications prevented or minimized   Start Date: 07/26/2020  Expected End Date: 01/26/2021  Recent Progress: On track  Priority: High  Note:   Current Barriers:  Ineffective Self Health Maintenance in a patient with DM, CKD Stage 4, Hypertensive Nephropathy, Diabetic mononeuropathy associated with type 2 Diabetes, Vitamin D deficiency, HLD Clinical Goal(s):  Collaboration with [name], MD regarding development and update of comprehensive plan of care as evidenced by provider attestation and co-signature Inter-disciplinary care team collaboration (see longitudinal plan of care) patient will work with care management team to address care coordination and chronic disease management needs related to Disease Management Educational Needs Care Coordination Medication Management and Education Psychosocial Support   Interventions:  07/26/20 completed successful outbound call with patient  Evaluation of current treatment plan related to DM, CKD Stage 4, Hypertensive Nephropathy, Diabetic mononeuropathy associated with type 2 Diabetes, Vitamin D deficiency, HLD, self-management and patient's adherence to plan as established by provider. Collaboration with [name], MD regarding  development and update of comprehensive plan of care as evidenced by provider attestation       and co-signature Inter-disciplinary care team collaboration (see longitudinal plan of care) Provided education to patient about basic disease process related to Constipation  Review of patient status, including review of consultant's reports, relevant laboratory and other test results, and medications completed. Reviewed medications with patient and discussed importance of medication adherence Educated patient on dietary and water recommendations, educated on importance of staying active while adhering to prescribed HEP Educated and instructed providing rationale to patient how to participate in home bowel training Sent in basket message to embedded Pharm D and PCP asking to consider switching patient from traditional iron to Fusion Plus  Discussed plans with patient for ongoing care management follow up and provided patient with direct contact information for care management team Self Care Activities:  Self administers medications as prescribed Attends all scheduled provider appointments Calls pharmacy for medication refills Calls provider office for new concerns or questions Patient Goals: - increase fiber intake - increase water intake - stay active by adhering to HEP - participate in home bowel training regimen as discussed   Follow Up Plan: Telephone follow up appointment with care management team member scheduled for: 11/17/20     Care Plan : Fall Risk (Adult)  Updates made by Barb Merino  L, RN since 09/20/2020 12:00 AM     Problem: Fall Risk   Priority: High     Long-Range Goal: Absence of Fall and Fall-Related Injury   Start Date: 09/20/2020  Expected End Date: 03/20/2021  This Visit's Progress: On track  Priority: High  Note:   Current Barriers:  Knowledge Deficits related to fall precautions in patient with  Decreased adherence to prescribed treatment for fall  prevention Impaired Gait  Worsening Diabetic Neuropathy to both feet  Clinical Goal(s):  patient will verbalize using fall risk reduction strategies discussed patient will not experience having any falls patient will attend all scheduled medical appointments: Neurology appointment scheduled with Dr. Tomi Likens on 11/16/20 for evaluation of impaired gait  Interventions:  09/20/20 completed successful outbound call with patient  Collaboration with [name], MD regarding development and update of comprehensive plan of care as evidenced by provider attestation and co-signature Inter-disciplinary care team collaboration (see longitudinal plan of care) Evaluation of current treatment plan related to Impaired Gait and patient's adherence to plan as established by provider. Determined patient was noted to have a shuffling gait during last PCP follow up  Assessed for falls and or fall risk, patient denies and reports adherence with using his walker at all times  Educated on importance of using DME at all times to help avoid falls Reviewed scheduled/upcoming provider appointments including: Neurology follow up for evaluation of impaired gait and persistent, worsening neuropathy, scheduled with Dr. Tomi Likens on 11/16/20 Discussed plans with patient for ongoing care management follow up and provided patient with direct contact information for care management team Self-Care Deficits:  - Impaired Gait Patient Goals:  - Utilize walker appropriately with all ambulation - De-clutter walkways - Change positions slowly - Wear secure fitting shoes at all times with ambulation - Utilize home lighting for dim lit areas - Demonstrate self and pet awareness at all times  Follow Up Plan: Telephone follow up appointment with care management team member scheduled for: 11/17/20     Plan:Telephone follow up appointment with care management team member scheduled for:  11/17/20  Barb Merino, RN, BSN, CCM Care Management  Coordinator [facility]  Direct Phone: [phone]

## 2020-09-22 DIAGNOSIS — N184 Chronic kidney disease, stage 4 (severe): Secondary | ICD-10-CM | POA: Diagnosis not present

## 2020-10-04 DIAGNOSIS — E1122 Type 2 diabetes mellitus with diabetic chronic kidney disease: Secondary | ICD-10-CM | POA: Diagnosis not present

## 2020-10-04 DIAGNOSIS — G8929 Other chronic pain: Secondary | ICD-10-CM | POA: Diagnosis not present

## 2020-10-04 DIAGNOSIS — S42201D Unspecified fracture of upper end of right humerus, subsequent encounter for fracture with routine healing: Secondary | ICD-10-CM | POA: Diagnosis not present

## 2020-10-04 DIAGNOSIS — I639 Cerebral infarction, unspecified: Secondary | ICD-10-CM | POA: Diagnosis not present

## 2020-10-06 ENCOUNTER — Other Ambulatory Visit: Payer: Self-pay

## 2020-10-06 ENCOUNTER — Encounter: Payer: Self-pay | Admitting: Internal Medicine

## 2020-10-06 ENCOUNTER — Ambulatory Visit (INDEPENDENT_AMBULATORY_CARE_PROVIDER_SITE_OTHER): Payer: Medicare Other | Admitting: Internal Medicine

## 2020-10-06 VITALS — BP 130/78 | HR 88 | Temp 97.6°F | Ht 64.0 in | Wt 204.4 lb

## 2020-10-06 DIAGNOSIS — Z6835 Body mass index (BMI) 35.0-35.9, adult: Secondary | ICD-10-CM

## 2020-10-06 DIAGNOSIS — Z794 Long term (current) use of insulin: Secondary | ICD-10-CM | POA: Diagnosis not present

## 2020-10-06 DIAGNOSIS — I129 Hypertensive chronic kidney disease with stage 1 through stage 4 chronic kidney disease, or unspecified chronic kidney disease: Secondary | ICD-10-CM | POA: Diagnosis not present

## 2020-10-06 DIAGNOSIS — N184 Chronic kidney disease, stage 4 (severe): Secondary | ICD-10-CM

## 2020-10-06 DIAGNOSIS — E0841 Diabetes mellitus due to underlying condition with diabetic mononeuropathy: Secondary | ICD-10-CM | POA: Diagnosis not present

## 2020-10-06 DIAGNOSIS — E1122 Type 2 diabetes mellitus with diabetic chronic kidney disease: Secondary | ICD-10-CM

## 2020-10-06 DIAGNOSIS — Z23 Encounter for immunization: Secondary | ICD-10-CM | POA: Diagnosis not present

## 2020-10-06 LAB — HEMOGLOBIN A1C
Est. average glucose Bld gHb Est-mCnc: 146 mg/dL
Hgb A1c MFr Bld: 6.7 % — ABNORMAL HIGH (ref 4.8–5.6)

## 2020-10-06 NOTE — Progress Notes (Signed)
I,Tianna Badgett,acting as a Education administrator for [name], MD.,have documented all relevant documentation on the behalf of [name], MD,as directed by  [name], MD while in the presence of [name], MD.  This visit occurred during the SARS-CoV-2 public health emergency.  Safety protocols were in place, including screening questions prior to the visit, additional usage of staff PPE, and extensive cleaning of exam room while observing appropriate contact time as indicated for disinfecting solutions.  Subjective:     Patient ID: Justin Holloway , male    DOB: Nov 02, 1929 , 87 y.o.   MRN: [id number]   Chief Complaint  Patient presents with  . Diabetes  . Hypertension    HPI  Patient is here for DM/HTN follow up. He would like to discuss issues with neuropathy. He states that it has gotten really bad and keeps him awake at night.   Diabetes He presents for his follow-up diabetic visit. He has type 2 diabetes mellitus. His disease course has been stable. There are no hypoglycemic associated symptoms. Pertinent negatives for diabetes include no blurred vision. There are no hypoglycemic complications. Risk factors for coronary artery disease include dyslipidemia, hypertension, diabetes mellitus, male sex, sedentary lifestyle and obesity. He is compliant with treatment most of the time. He is following a generally healthy diet. He participates in exercise intermittently. Eye exam is not current.  Hypertension This is a chronic problem. The current episode started more than 1 year ago. The problem is controlled. Pertinent negatives include no blurred vision, palpitations or shortness of breath. Past treatments include diuretics. The current treatment provides moderate improvement. Hypertensive end-organ damage includes kidney disease.    Past Medical History:  Diagnosis Date  . Arthritis   . CKD (chronic kidney disease), stage IV ([place])   . Diabetes mellitus without complication  ([place])   . Glaucoma    "blurred vision", see Dr Gershon Crane yearly  . Hyperlipidemia   . Hypertension   . TIA (transient ischemic attack)      Family History  Problem Relation Age of Onset  . Cancer Mother 77       breast  . COPD Father 60       Congestive heart failure     Current Outpatient Medications:  .  Ascorbic Acid (VITAMIN C) 1000 MG tablet, Take 1,000 mg by mouth daily. , Disp: , Rfl:  .  aspirin EC 81 MG tablet, Take 81 mg by mouth every evening., Disp: , Rfl:  .  Blood Glucose Monitoring Suppl (ONE TOUCH ULTRA 2) w/Device KIT, Use as directed to check blood sugars 2 times per day dx: e11.22, Disp: 1 kit, Rfl: 0 .  cholecalciferol (VITAMIN D) 1000 units tablet, Take 2,000 Units by mouth daily. , Disp: , Rfl:  .  COMBIGAN 0.2-0.5 % ophthalmic solution, Place 1 drop into both eyes every 12 (twelve) hours., Disp: , Rfl:  .  diclofenac Sodium (VOLTAREN) 1 % GEL, Apply 4 g topically 4 (four) times daily. (Patient taking differently: Apply 2-4 g topically in the morning and at bedtime. shoulder), Disp: 100 g, Rfl: 0 .  furosemide (LASIX) 80 MG tablet, TAKE 1 TABLET BY MOUTH IN  THE MORNING AND ONE-HALF  TABLET BY MOUTH IN THE  EARLY EVENING (Patient taking differently: Take 40-80 mg by mouth See admin instructions. 80 mg in the morning and 40 mg in the evening.), Disp: 135 tablet, Rfl: 3 .  glucose blood (ONETOUCH ULTRA) test strip, USE STRIPS TO TEST  BLOOD  SUGAR 3 TIMES DAILY 1 STRIP PER TEST, Disp: 300 strip, Rfl: 3 .  hydrALAZINE (APRESOLINE) 10 MG tablet, Take 10 mg by mouth 3 (three) times daily., Disp: , Rfl:  .  insulin degludec (TRESIBA) 200 UNIT/ML FlexTouch Pen, Inject 24 Units into the skin at bedtime., Disp: 3 mL, Rfl: 1 .  Iron-FA-B Cmp-C-Biot-Probiotic (FUSION PLUS) CAPS, Take by mouth. 1 capsule per day, Disp: , Rfl:  .  Lancets (ONETOUCH DELICA PLUS FOYDXA12I) MISC, 100 each by Does not apply route in the morning, at noon, and at bedtime., Disp: 300 each, Rfl: 3 .   LUMIGAN 0.01 % SOLN, Place 1 drop into both eyes 2 (two) times daily. , Disp: , Rfl:  .  Multiple Vitamin (MULTIVITAMIN WITH MINERALS) TABS tablet, Take 1 tablet by mouth daily., Disp: , Rfl:  .  polyethylene glycol powder (GLYCOLAX/MIRALAX) 17 GM/SCOOP powder, Take 17 g by mouth daily as needed for mild constipation., Disp: , Rfl:  .  pravastatin (PRAVACHOL) 40 MG tablet, Take 1 tablet (40 mg total) by mouth daily. (Patient taking differently: Take 40 mg by mouth at bedtime.), Disp: 90 tablet, Rfl: 3 .  pregabalin (LYRICA) 25 MG capsule, Take 1 capsule (25 mg total) by mouth at bedtime., Disp: 90 capsule, Rfl: 0 .  Semaglutide,0.25 or 0.5MG /DOS, (OZEMPIC, 0.25 OR 0.5 MG/DOSE,) 2 MG/1.5ML SOPN, Inject 0.5 mg into the skin once a week., Disp: 1.5 mL, Rfl: 1 .  tamsulosin (FLOMAX) 0.4 MG CAPS capsule, Take 0.4 mg by mouth daily., Disp: , Rfl:    Allergies  Allergen Reactions  . Gabapentin Itching     Review of Systems  Constitutional: Negative.   Eyes:  Negative for blurred vision.  Respiratory: Negative.  Negative for shortness of breath.   Cardiovascular: Negative.  Negative for palpitations.  Gastrointestinal: Negative.   Neurological:  Positive for numbness.    Today's Vitals   10/06/20 1534  BP: 130/78  Pulse: 88  Temp: 97.6 F (36.4 C)  TempSrc: Oral  Weight: 204 lb 6.4 oz (92.7 kg)  Height: 5\' 4"  (1.626 m)   Body mass index is 35.09 kg/m.  Wt Readings from Last 3 Encounters:  10/06/20 204 lb 6.4 oz (92.7 kg)  08/18/20 202 lb (91.6 kg)  08/17/20 203 lb 9.6 oz (92.4 kg)    Objective:  Physical Exam Vitals and nursing note reviewed.  Constitutional:      Appearance: Normal appearance. He is obese.  HENT:     Head: Normocephalic and atraumatic.     Nose:     Comments: Masked     Mouth/Throat:     Comments: Masked  Eyes:     Extraocular Movements: Extraocular movements intact.  Cardiovascular:     Rate and Rhythm: Normal rate and regular rhythm.     Heart sounds:  Normal heart sounds.  Pulmonary:     Effort: Pulmonary effort is normal.     Breath sounds: Normal breath sounds.  Musculoskeletal:     Cervical back: Normal range of motion.     Comments: Ambulatory with walker  Skin:    General: Skin is warm.  Neurological:     General: No focal deficit present.     Mental Status: He is alert.  Psychiatric:        Mood and Affect: Mood normal.        Assessment And Plan:     1. Type 2 diabetes mellitus with stage 4 chronic kidney disease, with long-term current use  of insulin ([place]) Comments: Chronic, I will check labs as listed below. I will make med changes as needed. He was commended for maintaining his dietary compliance.  - Hemoglobin A1c  2. Diabetic mononeuropathy associated with diabetes mellitus due to underlying condition [facility]) Comments: I will look into [facility] to determine if they offer any treatments for neuropathy. He is in agreement with treatment plan.   3. Hypertensive nephropathy Comments: Chronic, controlled. Encouraged to follow low sodium diet. No med changes.   4. Class 2 severe obesity due to excess calories with serious comorbidity and body mass index (BMI) of 35.0 to 35.9 in adult [facility]) Comments:  He is encouraged to initially strive for BMI less than 30 to decrease cardiac risk. He is advised to exercise no less than 150 minutes per week.    5. Need for influenza vaccination Comments: He was given high dose flu vaccine.  - Flu Vaccine QUAD High Dose(Fluad)  Patient was given opportunity to ask questions. Patient verbalized understanding of the plan and was able to repeat key elements of the plan. All questions were answered to their satisfaction.   I, [name], MD, have reviewed all documentation for this visit. The documentation on 10/06/20 for the exam, diagnosis, procedures, and orders are all accurate and complete.   IF YOU HAVE BEEN REFERRED TO A SPECIALIST, IT MAY TAKE 1-2 WEEKS TO  SCHEDULE/PROCESS THE REFERRAL. IF YOU HAVE NOT HEARD FROM US/SPECIALIST IN TWO WEEKS, PLEASE GIVE [place] A CALL AT [phone] X [phone].   THE PATIENT IS ENCOURAGED TO PRACTICE SOCIAL DISTANCING DUE TO THE COVID-19 PANDEMIC.

## 2020-10-06 NOTE — Patient Instructions (Signed)

## 2020-10-08 DIAGNOSIS — E0841 Diabetes mellitus due to underlying condition with diabetic mononeuropathy: Secondary | ICD-10-CM

## 2020-10-08 DIAGNOSIS — N184 Chronic kidney disease, stage 4 (severe): Secondary | ICD-10-CM

## 2020-10-08 DIAGNOSIS — E785 Hyperlipidemia, unspecified: Secondary | ICD-10-CM

## 2020-10-08 DIAGNOSIS — I129 Hypertensive chronic kidney disease with stage 1 through stage 4 chronic kidney disease, or unspecified chronic kidney disease: Secondary | ICD-10-CM

## 2020-10-08 DIAGNOSIS — E1122 Type 2 diabetes mellitus with diabetic chronic kidney disease: Secondary | ICD-10-CM | POA: Diagnosis not present

## 2020-10-08 DIAGNOSIS — Z794 Long term (current) use of insulin: Secondary | ICD-10-CM | POA: Diagnosis not present

## 2020-10-11 DIAGNOSIS — R609 Edema, unspecified: Secondary | ICD-10-CM | POA: Diagnosis not present

## 2020-10-11 DIAGNOSIS — N2581 Secondary hyperparathyroidism of renal origin: Secondary | ICD-10-CM | POA: Diagnosis not present

## 2020-10-11 DIAGNOSIS — N184 Chronic kidney disease, stage 4 (severe): Secondary | ICD-10-CM | POA: Diagnosis not present

## 2020-10-11 DIAGNOSIS — I129 Hypertensive chronic kidney disease with stage 1 through stage 4 chronic kidney disease, or unspecified chronic kidney disease: Secondary | ICD-10-CM | POA: Diagnosis not present

## 2020-10-11 DIAGNOSIS — D631 Anemia in chronic kidney disease: Secondary | ICD-10-CM | POA: Diagnosis not present

## 2020-10-18 ENCOUNTER — Telehealth: Payer: Self-pay

## 2020-10-18 NOTE — Chronic Care Management (AMB) (Signed)
10/18/2020-  APPOINTMENT REMINDER   Justin Holloway was reminded to have all medications, supplements and any blood glucose and blood pressure readings available for review with [name], Pharm. D, at his telephone visit on 10/19/2020 at 10:00 AM. Spoke with son.  Pattricia Boss, [place] Pharmacist Assistant [phone]'

## 2020-10-19 ENCOUNTER — Ambulatory Visit (INDEPENDENT_AMBULATORY_CARE_PROVIDER_SITE_OTHER): Payer: Medicare Other

## 2020-10-19 DIAGNOSIS — Z794 Long term (current) use of insulin: Secondary | ICD-10-CM

## 2020-10-19 DIAGNOSIS — E1122 Type 2 diabetes mellitus with diabetic chronic kidney disease: Secondary | ICD-10-CM

## 2020-10-19 DIAGNOSIS — I129 Hypertensive chronic kidney disease with stage 1 through stage 4 chronic kidney disease, or unspecified chronic kidney disease: Secondary | ICD-10-CM

## 2020-10-19 NOTE — Progress Notes (Addendum)
Chronic Care Management Pharmacy Note  10/19/2020 Name:  Justin Holloway MRN:  [id number] DOB:  10-28-1929  Summary: Patient reports doing well.    Recommendations/Changes made from today's visit: Recommend patient receive COVID-19 booster vaccine in the next couple of week.   Plan: Patient to schedule COVID-19 vaccine booster at [place]. Patient to pick up medication: [place] and Ozempic from [place].     Subjective: Justin Holloway is an 87 y.o. year old male who is a primary patient of [name], MD.  The CCM team was consulted for assistance with disease management and care coordination needs.    Engaged with patient by telephone for follow up visit in response to provider referral for pharmacy case management and/or care coordination services.   Consent to Services:  The patient was given information about Chronic Care Management services, agreed to services, and gave verbal consent prior to initiation of services.  Please see initial visit note for detailed documentation.   Patient Care Team: [name], MD as PCP - General (Internal Medicine) Marylynn Marthena Whitmyer, MD as Consulting Physician (Ophthalmology) Lynne Logan, RN as Case Manager Mayford Knife, [facility] (Pharmacist)  Recent office visits: 10/06/2020 PCP OV 08/17/2020 PCP OV  06/09/2020 PCP OV   Recent consult visits:  Hospital visits: None in previous 6 months   Objective:  Lab Results  Component Value Date   CREATININE 3.06 (H) 06/09/2020   BUN 65 (H) 06/09/2020   GFRNONAA 21 (L) 05/15/2020   GFRAA 19 (L) 01/14/2020   NA 138 06/09/2020   K 4.6 06/09/2020   CALCIUM 8.8 06/09/2020   CO2 21 06/09/2020   GLUCOSE 223 (H) 06/09/2020    Lab Results  Component Value Date/Time   HGBA1C 6.7 (H) 10/06/2020 04:02 PM   HGBA1C 7.3 (H) 06/09/2020 05:22 PM   MICROALBUR 10 06/10/2020 12:38 PM   MICROALBUR 80 05/28/2019 12:29 PM    Last diabetic Eye exam: No results found for:  HMDIABEYEEXA  Last diabetic Foot exam: No results found for: HMDIABFOOTEX   Lab Results  Component Value Date   CHOL 173 09/10/2019   HDL 57 09/10/2019   LDLCALC 104 (H) 09/10/2019   TRIG 64 09/10/2019   CHOLHDL 3.0 09/10/2019    Hepatic Function Latest Ref Rng & Units 01/14/2020 11/17/2019 09/10/2019  Total Protein 6.0 - 8.5 g/dL 7.2 - 6.9  Albumin 3.6 - 4.6 g/dL 4.4 4.1 4.2  AST 0 - 40 IU/L 18 - 20  ALT 0 - 44 IU/L 8 - 13  Alk Phosphatase 44 - 121 IU/L 92 - 85  Total Bilirubin 0.0 - 1.2 mg/dL 0.4 - 0.3    Lab Results  Component Value Date/Time   TSH 2.620 02/14/2017 10:55 AM    CBC Latest Ref Rng & Units 06/09/2020 05/12/2020 05/11/2020  WBC 3.4 - 10.8 x10E3/uL 7.2 8.5 10.1  Hemoglobin 13.0 - 17.7 g/dL 10.3(L) 8.2(L) 8.5(L)  Hematocrit 37.5 - 51.0 % 31.6(L) 25.3(L) 26.0(L)  Platelets 150 - 450 x10E3/uL 235 258 246    Lab Results  Component Value Date/Time   VD25OH 34.1 09/10/2019 10:29 AM    Clinical ASCVD: Yes  The ASCVD Risk score (Arnett DK, et al., 2019) failed to calculate for the following reasons:   The 2019 ASCVD risk score is only valid for ages 22 to 80    Depression screen PHQ 2/9 08/18/2020 06/09/2020 05/28/2019  Decreased Interest 0 0 0  Down, Depressed, Hopeless 0 0 0  PHQ - 2  Score 0 0 0  Altered sleeping - 0 0  Tired, decreased energy - 0 0  Change in appetite - 0 0  Feeling bad or failure about yourself  - 0 0  Trouble concentrating - 0 0  Moving slowly or fidgety/restless - 0 0  Suicidal thoughts - - 0  PHQ-9 Score - 0 0  Difficult doing work/chores - - Not difficult at all     Social History   Tobacco Use  Smoking Status Former   Types: Cigars  Smokeless Tobacco Never  Tobacco Comments   socially, not long   BP Readings from Last 3 Encounters:  10/06/20 130/78  08/17/20 122/60  06/09/20 110/68   Pulse Readings from Last 3 Encounters:  10/06/20 88  08/17/20 66  06/09/20 68   Wt Readings from Last 3 Encounters:  10/06/20 204 lb 6.4 oz  (92.7 kg)  08/18/20 202 lb (91.6 kg)  08/17/20 203 lb 9.6 oz (92.4 kg)   BMI Readings from Last 3 Encounters:  10/06/20 35.09 kg/m  08/18/20 33.61 kg/m  08/17/20 34.95 kg/m    Assessment/Interventions: Review of patient past medical history, allergies, medications, health status, including review of consultants reports, laboratory and other test data, was performed as part of comprehensive evaluation and provision of chronic care management services.   SDOH:  (Social Determinants of Health) assessments and interventions performed: No  SDOH Screenings   Alcohol Screen: Not on file  Depression (PHQ2-9): Low Risk    PHQ-2 Score: 0  Financial Resource Strain: Low Risk    Difficulty of Paying Living Expenses: Not hard at all  Food Insecurity: No Food Insecurity   Worried About Charity fundraiser in the Last Year: Never true   Ran Out of Food in the Last Year: Never true  Housing: Not on file  Physical Activity: Insufficiently Active   Days of Exercise per Week: 3 days   Minutes of Exercise per Session: 20 min  Social Connections: Not on file  Stress: No Stress Concern Present   Feeling of Stress : Not at all  Tobacco Use: Medium Risk   Smoking Tobacco Use: Former   Smokeless Tobacco Use: Never  Transportation Needs: No Transportation Needs   Lack of Transportation (Medical): No   Lack of Transportation (Non-Medical): No    CCM Care Plan  Allergies  Allergen Reactions   Gabapentin Itching    Medications Reviewed Today     Reviewed by Mayford Knife, RPH (Pharmacist) on 10/19/20 at 1024  Med List Status: <None>   Medication Order Taking? Sig Documenting Provider Last Dose Status Informant  Ascorbic Acid (VITAMIN C) 1000 MG tablet [id number] No Take 1,000 mg by mouth daily.  [provider] Taking Active Self  aspirin EC 81 MG tablet [id number] No Take 81 mg by mouth every evening. [provider] Taking Active Self  Blood Glucose Monitoring Suppl  (ONE TOUCH ULTRA 2) w/Device KIT [id number]  Use as directed to check blood sugars 2 times per day dx: e11.22 [name], MD  Active   cholecalciferol (VITAMIN D) 1000 units tablet [id number] No Take 2,000 Units by mouth daily.  [provider] Taking Active Self  COMBIGAN 0.2-0.5 % ophthalmic solution [id number] No Place 1 drop into both eyes every 12 (twelve) hours. [provider] Taking Active Self  diclofenac Sodium (VOLTAREN) 1 % GEL [id number] No Apply 4 g topically 4 (four) times daily.  Patient taking differently: Apply 2-4 g topically in the morning and at  bedtime. shoulder   Deno Etienne, DO Taking Active   furosemide (LASIX) 80 MG tablet [id number] No TAKE 1 TABLET BY MOUTH IN  THE MORNING AND ONE-HALF  TABLET BY MOUTH IN THE  EARLY EVENING  Patient taking differently: Take 40-80 mg by mouth See admin instructions. 80 mg in the morning and 40 mg in the evening.   [name], MD Taking Active   glucose blood [facility] ULTRA) test strip [id number]  USE STRIPS TO TEST BLOOD  SUGAR 3 TIMES DAILY 1 STRIP PER TEST [name], MD  Active   hydrALAZINE (APRESOLINE) 10 MG tablet [id number] No Take 10 mg by mouth 3 (three) times daily. [provider] Taking Active Self  insulin degludec (TRESIBA) 200 UNIT/ML FlexTouch Pen [id number]  Inject 24 Units into the skin at bedtime. [name], MD  Active   Iron-FA-B Cmp-C-Biot-Probiotic (FUSION PLUS) CAPS [id number]  Take by mouth. 1 capsule per day [provider]  Active   Lancets (ONETOUCH DELICA PLUS BPZWCH85I) [place] [id number] No 100 each by Does not apply route in the morning, at noon, and at bedtime. Minette Brine, FNP Taking Active Self  LUMIGAN 0.01 % SOLN [id number] No Place 1 drop into both eyes 2 (two) times daily.  [provider] Taking Active Self  Multiple Vitamin (MULTIVITAMIN WITH MINERALS) TABS tablet [id number] No Take 1 tablet by mouth daily. [provider] Taking Active Self   polyethylene glycol powder (GLYCOLAX/MIRALAX) 17 GM/SCOOP powder [id number] No Take 17 g by mouth daily as needed for mild constipation. [provider] Taking Active Self  pravastatin (PRAVACHOL) 40 MG tablet [id number] No Take 1 tablet (40 mg total) by mouth daily.  Patient taking differently: Take 40 mg by mouth at bedtime.   [name], MD Taking Active   pregabalin (LYRICA) 25 MG capsule [id number]  Take 1 capsule (25 mg total) by mouth at bedtime. [name], NP  Active   Semaglutide,0.25 or 0.5MG/DOS, (OZEMPIC, 0.25 OR 0.5 MG/DOSE,) 2 MG/1.5ML SOPN [id number]  Inject 0.5 mg into the skin once a week. [name], MD  Active   tamsulosin [facility]) 0.4 MG CAPS capsule [id number] No Take 0.4 mg by mouth daily. [provider] Taking Active Self            Patient Active Problem List   Diagnosis Date Noted   Atherosclerosis of aorta ([place]) 08/22/2020   Shuffling gait 08/22/2020   Generalized weakness 05/10/2020   Failure to thrive in adult 05/10/2020   Chronic pain 05/10/2020   Constipation 05/10/2020   Class 1 drug-induced obesity with body mass index (BMI) of 34.0 to 34.9 in adult 05/10/2020   DNR (do not resuscitate) 05/10/2020   Hypertension    Hyperlipidemia    Glaucoma    CKD (chronic kidney disease), stage IV ([place])    Secondary renal hyperparathyroidism ([place]) 01/14/2020   Type 2 diabetes mellitus with stage 4 chronic kidney disease, with long-term current use of insulin ([place]) 09/10/2019   Peripheral vascular disease, unspecified ([place]) 05/28/2019   Diabetic neuropathy ([place]) 02/18/2019   Left upper quadrant pain 09/04/2018   Acute midline low back pain without sciatica 09/04/2018   CKD (chronic kidney disease) stage 4, GFR 15-29 ml/min ([place]) 11/21/2017   Hypertensive nephropathy 11/21/2017   Right foot pain 11/21/2017   Vertigo 09/09/2017   BPPV (benign paroxysmal positional vertigo), right 09/06/2017   Sensorineural hearing loss (SNHL),  bilateral 09/06/2017   Ataxia 02/06/2017   UTI (lower urinary tract infection) 08/07/2015   Dizziness  08/07/2015   Acute renal failure superimposed on stage 3 chronic kidney disease ([place]) 08/07/2015   Hyperkalemia 08/07/2015   Left inguinal hernia 05/15/2013   Diabetes mellitus without complication ([place]) 83/38/2505   HLD (hyperlipidemia) 03/08/2006   HYPERTENSION, BENIGN SYSTEMIC 03/08/2006   IMPOTENCE, 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   PROTEINURIA 03/08/2006    Immunization History  Administered Date(s) Administered   Fluad Quad(high Dose 65+) 09/23/2019, 10/06/2020   Influenza, High Dose Seasonal PF 11/21/2017, 08/29/2018   PFIZER(Purple Top)SARS-COV-2 Vaccination 02/01/2019, 03/06/2019, 11/08/2019   Pneumococcal Polysaccharide-23 12/10/1998    Conditions to be addressed/monitored:  Hyperlipidemia and Diabetes  Care Plan : [place]  Updates made by Mayford Knife, [place] since 10/19/2020 12:00 AM     Problem: HLD, DM II   Priority: High     Long-Range Goal: Disease Management   Recent Progress: On track  Priority: High  Note:   Current Barriers:  Unable to independently monitor therapeutic efficacy  Pharmacist Clinical Goal(s):  Patient will achieve adherence to monitoring guidelines and medication adherence to achieve therapeutic efficacy contact provider office for questions/concerns as evidenced notation of same in electronic health record through collaboration with PharmD and provider.   Interventions: 1:1 collaboration with [name], MD regarding development and update of comprehensive plan of care as evidenced by provider attestation and co-signature Inter-disciplinary care team collaboration (see longitudinal plan of care) Comprehensive medication review performed; medication list updated in electronic medical record  Hyperlipidemia: (LDL goal < 70) -Uncontrolled -Current treatment: Pravastatin 40 mg tablet taking once daily  -Medications  previously tried: none noted   -Current dietary patterns: patient is not eating  -Current exercise habits: please see diabetes for details  -Educated on Cholesterol goals;  Benefits of statin for ASCVD risk reduction; -Patient to have lipid panel completed prior to next PCP office visit in December  -Recommended to continue current medication   Diabetes (A1c goal <7%) -Controlled -Current medications: Tresiba- 24 units at bedtime Ozempic 0.5 mg - once a week on Saturday  -Current home glucose readings fasting glucose: 90, 100, 110 Patient reports BS is never below 90  -Denies hypoglycemic/hyperglycemic symptoms -Current meal patterns: eating more healthy  breakfast: oatmeal  or cheerios  lunch: chicken salad sandwich   dinner: protein and vegetables  snacks: not eating a lot of sweets drinks: plenty of water  -Current exercise: exercise every other day, for 20-25 minutes, he has a sheet of paper that the therapist gave him when they visited and he uses that  -Patient reports that he is not having any low blood sugar readings or experiencing any symptoms of low blood sugar.  -Educated on A1c and blood sugar goals; Prevention and management of hypoglycemic episodes; -Counseled to check feet daily and get yearly eye exams -Counseled on diet and exercise extensively Recommended to continue current medication  Health Maintenance -Vaccine gaps: Shingrix vaccine, covid-19 vaccine  -Current therapy:  Vitamin D- 2000 units daily  -Recommended to continue current medication   Patient Goals/Self-Care Activities Patient will:  - take medications as prescribed  Follow Up Plan: The patient has been provided with contact information for the care management team and has been advised to call with any health related questions or concerns.        Medication Assistance:  Ozempic and Tyler Aas obtained through [employer] nordisk medication assistance program.  Enrollment ends 12/2020.    Compliance/Adherence/Medication fill history: Care Gaps: Shingrix Vaccine COVID-19 Booster   Star-Rating Drugs: Pravastatin 40 mg tablet Ozempic 0.5 mg  injection   Patient's preferred pharmacy is:  [facility] [place], [place] [address] [place] [address] [facility] [address] Phone: [phone] Fax: [phone] - [place], [place] [address] Phone: [phone] Fax: [phone]  [facility]  ([place], [place] [facility] [address] [place] [address] [place] [address] Phone: [phone] Fax: [place], [place] [address] [place] [address] Phone: [phone] Fax: [phone]  Uses pill box? Yes Pt endorses 95% compliance  We discussed: Current pharmacy is preferred with insurance plan and patient is satisfied with pharmacy services Patient decided to: Continue current medication management strategy  Care Plan and Follow Up Patient Decision:  Patient agrees to Care Plan and Follow-up.  Plan: The patient has been provided with contact information for the care management team and has been advised to call with any health related questions or concerns.   [name], PharmD Clinical Pharmacist [facility] [phone]

## 2020-10-19 NOTE — Patient Instructions (Signed)
Visit Information It was great speaking with you today!  Please let me know if you have any questions about our visit.   Goals Addressed             This Visit's Progress    Manage My Medicine       Timeframe:  Long-Range Goal Priority:  High Start Date:                             Expected End Date:                       Follow Up Date 04/20/2021  In Progress:    - call for medicine refill 2 or 3 days before it runs out - keep a list of all the medicines I take; vitamins and herbals too - use a pillbox to sort medicine - use an alarm clock or phone to remind me to take my medicine    Why is this important?   These steps will help you keep on track with your medicines.           Patient Care Plan: CCM Pharmacy Care Plan     Problem Identified: HLD, DM II   Priority: High     Long-Range Goal: Disease Management   Recent Progress: On track  Priority: High  Note:   Current Barriers:  Unable to independently monitor therapeutic efficacy  Pharmacist Clinical Goal(s):  Patient will achieve adherence to monitoring guidelines and medication adherence to achieve therapeutic efficacy contact provider office for questions/concerns as evidenced notation of same in electronic health record through collaboration with PharmD and provider.   Interventions: 1:1 collaboration with [name], MD regarding development and update of comprehensive plan of care as evidenced by provider attestation and co-signature Inter-disciplinary care team collaboration (see longitudinal plan of care) Comprehensive medication review performed; medication list updated in electronic medical record  Hyperlipidemia: (LDL goal < 70) -Uncontrolled -Current treatment: Pravastatin 40 mg tablet taking once daily  -Medications previously tried: none noted   -Current dietary patterns: patient is not eating  -Current exercise habits: please see diabetes for details  -Educated on Cholesterol  goals;  Benefits of statin for ASCVD risk reduction; -Patient to have lipid panel completed prior to next PCP office visit in December  -Recommended to continue current medication   Diabetes (A1c goal <7%) -Controlled -Current medications: Tresiba- 24 units at bedtime Ozempic 0.5 mg - once a week on Saturday  -Current home glucose readings fasting glucose: 90, 100, 110 Patient reports BS is never below 90  -Denies hypoglycemic/hyperglycemic symptoms -Current meal patterns: eating more healthy  breakfast: oatmeal  or cheerios  lunch: chicken salad sandwich   dinner: protein and vegetables  snacks: not eating a lot of sweets drinks: plenty of water  -Current exercise: exercise every other day, for 20-25 minutes, he has a sheet of paper that the therapist gave him when they visited and he uses that  -Patient reports that he is not having any low blood sugar readings or experiencing any symptoms of low blood sugar.  -Educated on A1c and blood sugar goals; Prevention and management of hypoglycemic episodes; -Counseled to check feet daily and get yearly eye exams -Counseled on diet and exercise extensively Recommended to continue current medication  Health Maintenance -Vaccine gaps: Shingrix vaccine, covid-19 vaccine  -Current therapy:  Vitamin D- 2000 units daily  -Recommended to continue current medication  Patient Goals/Self-Care Activities Patient will:  - take medications as prescribed  Follow Up Plan: The patient has been provided with contact information for the care management team and has been advised to call with any health related questions or concerns.         Patient agreed to services and verbal consent obtained.   The patient verbalized understanding of instructions, educational materials, and care plan provided today and agreed to receive a mailed copy of patient instructions, educational materials, and care plan.   Justin Holloway, PharmD Clinical  Pharmacist [facility] [phone]

## 2020-10-20 ENCOUNTER — Encounter: Payer: Medicare Other | Admitting: Nurse Practitioner

## 2020-11-03 DIAGNOSIS — S42201D Unspecified fracture of upper end of right humerus, subsequent encounter for fracture with routine healing: Secondary | ICD-10-CM | POA: Diagnosis not present

## 2020-11-03 DIAGNOSIS — I639 Cerebral infarction, unspecified: Secondary | ICD-10-CM | POA: Diagnosis not present

## 2020-11-03 DIAGNOSIS — G8929 Other chronic pain: Secondary | ICD-10-CM | POA: Diagnosis not present

## 2020-11-03 DIAGNOSIS — E1122 Type 2 diabetes mellitus with diabetic chronic kidney disease: Secondary | ICD-10-CM | POA: Diagnosis not present

## 2020-11-08 DIAGNOSIS — E1122 Type 2 diabetes mellitus with diabetic chronic kidney disease: Secondary | ICD-10-CM

## 2020-11-08 DIAGNOSIS — N184 Chronic kidney disease, stage 4 (severe): Secondary | ICD-10-CM

## 2020-11-08 DIAGNOSIS — I129 Hypertensive chronic kidney disease with stage 1 through stage 4 chronic kidney disease, or unspecified chronic kidney disease: Secondary | ICD-10-CM | POA: Diagnosis not present

## 2020-11-08 DIAGNOSIS — Z794 Long term (current) use of insulin: Secondary | ICD-10-CM

## 2020-11-15 NOTE — Progress Notes (Signed)
NEUROLOGY CONSULTATION NOTE  LEMARCUS Holloway Holloway: [id number] DOB: [birth year]  Referring provider: [name], MD Primary care provider: [name], MD  Reason for consult:  gait instability   Assessment/Plan:   Diabetic polyneuropathy Gait instability multifactorial due to underlying neuropathy and arthritis.  1 Will start duloxetine 58m daily.  We can increase to 691mdaily in 6 weeks if needed.  Caution in glaucoma patients. 2  Continue exercises to help with core strength and balance taught while previously in physical therapy. 3  Follow up 6 months.   Subjective:  Justin LUKASs an [age]ear old male with CKD, DM II, HTN, HLD, glaucoma (early stages) and TIA who presents for gait instability.  History supplemented by prior neurologist's and referring provider's notes.  He is accompanied by his daughter.  Progressive gait instability since 2016.  He was evaluated by neurology and underwent workup.  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  Repeat brain MRIs from 08/07/2015, 02/07/2016, 01/06/2017, 09/09/2017 and 08/31/2020 personally reviewed were stable.  Labs for neuropathy and muscle weakness weakness on 02/14/2017 showed B12 1,542, TSH 2.620, CK 159, aldolase 4.5, negative Ach R binding antibody, and Hgb A1c 10.6.  Balance problems thought to be related to diabetic polyneuropathy and possibly lumbosacral plexopathy.  Patient reportedly declined MRI of lumbar spine.  Patient also has arthritis of the left knee, as demonstrated on X-ray on 05/10/2020.  Most recent Hgb A1c from 10/06/2020 was 6.7.  He has been to physical therapy and continues home exercises regularly (about every other day).   He has neuropathy, stable over past 2 years.  He does report pain in his feet.  A painful pins and needles sensation on the bottom of his feet, particularly the  heels.  It is noticeable at night in bed.  It occurs about 3 nights a week.  He will clean his feet and apply vasoline, which helps soothe them.  He has tried and failed gabapentin and pregablin.   PAST MEDICAL HISTORY: Past Medical History:  Diagnosis Date   Arthritis    [place]D [place]ch[place] disease), [place] IV [place]   Diabetes mellitus without complication [place]   Glaucoma    "blurred vision", see Dr ShGershon Craneearly   Hyperlipidemia    Hypertension    TIA (transient ischemic attack)     PAST SURGICAL HISTORY: Past Surgical History:  Procedure Laterality Date   INGUINAL HERNIA REPAIR Left 06/09/2013   Procedure: OPEN REPAIR LEFT INGUINAL HERNIA;  Surgeon: HaAdin HectorMD;  [facility];  Laterality: Left;   INSERTION OF MESH Left 06/09/2013   Procedure: INSERTION OF MESH;  Surgeon: HaAdin HectorMD;  [facility];  Laterality: Left;    MEDICATIONS: Current Outpatient Medications on File Prior to Visit  Medication Sig Dispense Refill   Ascorbic Acid (VITAMIN C) 1000 MG tablet Take 1,000 mg by mouth daily.      aspirin EC 81 MG tablet Take 81 mg by mouth every evening.     Blood Glucose Monitoring Suppl (ONE TOUCH ULTRA 2) w/Device KIT Use as directed to check blood sugars 2 times per day dx: e11.22 1 kit 0   cholecalciferol (VITAMIN D) 1000 units tablet Take 2,000 Units by mouth daily.      COMBIGAN 0.2-0.5 % ophthalmic solution Place 1 drop into both eyes every  12 (twelve) hours.     diclofenac Sodium (VOLTAREN) 1 % GEL Apply 4 g topically 4 (four) times daily. (Patient taking differently: Apply 2-4 g topically in the morning and at bedtime. shoulder) 100 g 0   furosemide (LASIX) 80 MG tablet TAKE 1 TABLET BY MOUTH IN  THE MORNING AND ONE-HALF  TABLET BY MOUTH IN THE  EARLY EVENING (Patient taking differently: Take 40-80 mg by mouth See admin instructions. 80 mg in the morning and 40 mg in the evening.) 135 tablet 3   glucose blood  (ONETOUCH ULTRA) test strip USE STRIPS TO TEST BLOOD  SUGAR 3 TIMES DAILY 1 STRIP PER TEST 300 strip 3   hydrALAZINE (APRESOLINE) 10 MG tablet Take 10 mg by mouth 3 (three) times daily.     insulin degludec (TRESIBA) 200 UNIT/ML FlexTouch Pen Inject 24 Units into the skin at bedtime. 3 mL 1   Iron-FA-B Cmp-C-Biot-Probiotic (FUSION PLUS) CAPS Take by mouth. 1 capsule per day     Lancets (ONETOUCH DELICA PLUS [id number]) MISC 100 each by Does not apply route in the morning, at noon, and at bedtime. 300 each 3   LUMIGAN 0.01 % SOLN Place 1 drop into both eyes 2 (two) times daily.      Multiple Vitamin (MULTIVITAMIN WITH MINERALS) TABS tablet Take 1 tablet by mouth daily.     polyethylene glycol powder (GLYCOLAX/MIRALAX) 17 GM/SCOOP powder Take 17 g by mouth daily as needed for mild constipation.     pravastatin (PRAVACHOL) 40 MG tablet Take 1 tablet (40 mg total) by mouth daily. (Patient taking differently: Take 40 mg by mouth at bedtime.) 90 tablet 3   pregabalin (LYRICA) 25 MG capsule Take 1 capsule (25 mg total) by mouth at bedtime. 90 capsule 0   Semaglutide,0.25 or 0.5MG/DOS, (OZEMPIC, 0.25 OR 0.5 MG/DOSE,) 2 MG/1.5ML SOPN Inject 0.5 mg into the skin once a week. 1.5 mL 1   tamsulosin (FLOMAX) 0.4 MG CAPS capsule Take 0.4 mg by mouth daily.     No current facility-administered medications on file prior to visit.    ALLERGIES: Allergies  Allergen Reactions   Gabapentin Itching    FAMILY HISTORY: Family History  Problem Relation Age of Onset   Cancer Mother 60       breast   COPD Father 66       Congestive heart failure    Objective:  Blood pressure (!) 115/56, pulse 95, height 5' 5" (1.651 m), weight 205 lb (93 kg), SpO2 98 %. General: No acute distress.  Patient appears well-groomed.   Head:  Normocephalic/atraumatic Eyes:  fundi examined but not visualized Neck: supple, no paraspinal tenderness, full range of motion Back: No paraspinal tenderness Heart: regular rate and  rhythm Lungs: Clear to auscultation bilaterally. Vascular: No carotid bruits. Neurological Exam: Mental status: alert and oriented to person, place, and time, speech fluent and not dysarthric, language intact. Cranial nerves: CN I: not tested CN II: pupils equal, round and reactive to light, visual fields intact CN III, IV, VI:  full range of motion, no nystagmus, no ptosis CN V: facial sensation intact. CN VII: upper and lower face symmetric CN VIII: hearing intact CN IX, X: gag intact, uvula midline CN XI: sternocleidomastoid and trapezius muscles intact CN XII: tongue midline Bulk & Tone: normal, no fasciculations. Motor:  muscle strength 5/5 throughout Sensation:  Vibratory sensation reduced in feet up to knees. Deep Tendon Reflexes:  absent throughout,  toes downgoing.   Finger to nose testing:  Without dysmetria.   Gait:  Broad-based gait with use of walker.  Reports that he cannot ambulate without it.  Romberg not evaluated.      Thank you for allowing me to take part in the care of this patient.  Adam Jaffe, DO  CC: Justin Sanders, MD     

## 2020-11-16 ENCOUNTER — Encounter: Payer: Self-pay | Admitting: Neurology

## 2020-11-16 ENCOUNTER — Other Ambulatory Visit: Payer: Self-pay

## 2020-11-16 ENCOUNTER — Ambulatory Visit: Payer: Medicare Other | Admitting: Neurology

## 2020-11-16 VITALS — BP 115/56 | HR 95 | Ht 65.0 in | Wt 205.0 lb

## 2020-11-16 DIAGNOSIS — E1142 Type 2 diabetes mellitus with diabetic polyneuropathy: Secondary | ICD-10-CM

## 2020-11-16 DIAGNOSIS — R2681 Unsteadiness on feet: Secondary | ICD-10-CM | POA: Diagnosis not present

## 2020-11-16 MED ORDER — DULOXETINE HCL 30 MG PO CPEP: 30.0000 mg | ORAL_CAPSULE | Freq: Every day | ORAL | 5 refills | Status: DC

## 2020-11-16 NOTE — Patient Instructions (Signed)
To help with nerve pain, start duloxetine 30mg  daily.  If no improvement in 6 weeks, contact me and we can increase dose. Follow up in 6 months.

## 2020-11-17 ENCOUNTER — Ambulatory Visit (INDEPENDENT_AMBULATORY_CARE_PROVIDER_SITE_OTHER): Payer: Medicare Other

## 2020-11-17 ENCOUNTER — Telehealth: Payer: Medicare Other

## 2020-11-17 DIAGNOSIS — E559 Vitamin D deficiency, unspecified: Secondary | ICD-10-CM

## 2020-11-17 DIAGNOSIS — E1122 Type 2 diabetes mellitus with diabetic chronic kidney disease: Secondary | ICD-10-CM

## 2020-11-17 DIAGNOSIS — E785 Hyperlipidemia, unspecified: Secondary | ICD-10-CM

## 2020-11-17 DIAGNOSIS — E0841 Diabetes mellitus due to underlying condition with diabetic mononeuropathy: Secondary | ICD-10-CM

## 2020-11-17 DIAGNOSIS — I129 Hypertensive chronic kidney disease with stage 1 through stage 4 chronic kidney disease, or unspecified chronic kidney disease: Secondary | ICD-10-CM

## 2020-11-18 NOTE — Chronic Care Management (AMB) (Signed)
Chronic Care Management   CCM RN Visit Note  11/17/2020 Name: Justin Holloway MRN: [id number] DOB: Apr 08, 1929  Subjective: Justin Holloway is a 87 y.o. year old male who is a primary care patient of [name], MD. The care management team was consulted for assistance with disease management and care coordination needs.    Engaged with patient by telephone for follow up visit in response to provider referral for case management and/or care coordination services.   Consent to Services:  The patient was given information about Chronic Care Management services, agreed to services, and gave verbal consent prior to initiation of services.  Please see initial visit note for detailed documentation.   Patient agreed to services and verbal consent obtained.   Assessment: Review of patient past medical history, allergies, medications, health status, including review of consultants reports, laboratory and other test data, was performed as part of comprehensive evaluation and provision of chronic care management services.   SDOH (Social Determinants of Health) assessments and interventions performed:  Yes, no acute challenges   CCM Care Plan  Allergies  Allergen Reactions   Gabapentin Itching    Outpatient Encounter Medications as of 11/17/2020  Medication Sig   Ascorbic Acid (VITAMIN C) 1000 MG tablet Take 1,000 mg by mouth daily.    aspirin EC 81 MG tablet Take 81 mg by mouth every evening.   Blood Glucose Monitoring Suppl (ONE TOUCH ULTRA 2) w/Device KIT Use as directed to check blood sugars 2 times per day dx: e11.22   cholecalciferol (VITAMIN D) 1000 units tablet Take 2,000 Units by mouth daily.    COMBIGAN 0.2-0.5 % ophthalmic solution Place 1 drop into both eyes every 12 (twelve) hours.   diclofenac Sodium (VOLTAREN) 1 % GEL Apply 4 g topically 4 (four) times daily. (Patient taking differently: Apply 2-4 g topically in the morning and at bedtime. shoulder)   DULoxetine (CYMBALTA)  30 MG capsule Take 1 capsule (30 mg total) by mouth daily.   furosemide (LASIX) 80 MG tablet TAKE 1 TABLET BY MOUTH IN  THE MORNING AND ONE-HALF  TABLET BY MOUTH IN THE  EARLY EVENING (Patient taking differently: Take 40-80 mg by mouth See admin instructions. 80 mg in the morning and 40 mg in the evening.)   glucose blood (ONETOUCH ULTRA) test strip USE STRIPS TO TEST BLOOD  SUGAR 3 TIMES DAILY 1 STRIP PER TEST   hydrALAZINE (APRESOLINE) 10 MG tablet Take 10 mg by mouth 3 (three) times daily.   insulin degludec (TRESIBA) 200 UNIT/ML FlexTouch Pen Inject 24 Units into the skin at bedtime.   Iron-FA-B Cmp-C-Biot-Probiotic (FUSION PLUS) CAPS Take by mouth. 1 capsule per day (Patient not taking: Reported on 11/16/2020)   Lancets (ONETOUCH DELICA PLUS [id number]) MISC 100 each by Does not apply route in the morning, at noon, and at bedtime.   LUMIGAN 0.01 % SOLN Place 1 drop into both eyes 2 (two) times daily.    Multiple Vitamin (MULTIVITAMIN WITH MINERALS) TABS tablet Take 1 tablet by mouth daily.   polyethylene glycol powder (GLYCOLAX/MIRALAX) 17 GM/SCOOP powder Take 17 g by mouth daily as needed for mild constipation.   pravastatin (PRAVACHOL) 40 MG tablet Take 1 tablet (40 mg total) by mouth daily. (Patient taking differently: Take 40 mg by mouth at bedtime.)   pregabalin (LYRICA) 25 MG capsule Take 1 capsule (25 mg total) by mouth at bedtime. (Patient not taking: Reported on 11/16/2020)   Semaglutide,0.25 or 0.5MG/DOS, (OZEMPIC, 0.25 OR 0.5 MG/DOSE,) 2 MG/1.5ML  SOPN Inject 0.5 mg into the skin once a week.   tamsulosin (FLOMAX) 0.4 MG CAPS capsule Take 0.4 mg by mouth daily.   No facility-administered encounter medications on file as of 11/17/2020.    Patient Active Problem List   Diagnosis Date Noted   Atherosclerosis of aorta ([place]) 08/22/2020   Shuffling gait 08/22/2020   Generalized weakness 05/10/2020   Failure to thrive in adult 05/10/2020   Chronic pain 05/10/2020   Constipation  05/10/2020   Class 1 drug-induced obesity with body mass index (BMI) of 34.0 to 34.9 in adult 05/10/2020   DNR (do not resuscitate) 05/10/2020   Hypertension    Hyperlipidemia    Glaucoma    CKD (chronic kidney disease), stage IV ([place])    Secondary renal hyperparathyroidism ([place]) 01/14/2020   Type 2 diabetes mellitus with stage 4 chronic kidney disease, with long-term current use of insulin ([place]) 09/10/2019   Peripheral vascular disease, unspecified ([place]) 05/28/2019   Diabetic neuropathy ([place]) 02/18/2019   Left upper quadrant pain 09/04/2018   Acute midline low back pain without sciatica 09/04/2018   CKD (chronic kidney disease) stage 4, GFR 15-29 ml/min ([facility]) 11/21/2017   Hypertensive nephropathy 11/21/2017   Right foot pain 11/21/2017   Vertigo 09/09/2017   BPPV (benign paroxysmal positional vertigo), right 09/06/2017   Sensorineural hearing loss (SNHL), bilateral 09/06/2017   Ataxia 02/06/2017   UTI (lower urinary tract infection) 08/07/2015   Dizziness 08/07/2015   Acute renal failure superimposed on stage 3 chronic kidney disease ([place]) 08/07/2015   Hyperkalemia 08/07/2015   Left inguinal hernia 05/15/2013   Diabetes mellitus without complication ([place]) 33/43/5686   HLD (hyperlipidemia) 03/08/2006   HYPERTENSION, BENIGN SYSTEMIC 03/08/2006   IMPOTENCE, 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   PROTEINURIA 03/08/2006    Conditions to be addressed/monitored: DM, CKD Stage 4, Hypertensive Nephropathy, Diabetic mononeuropathy associated with type 2 Diabetes, Vitamin D deficiency, HLD  Care Plan : RN Care Manager Plan of Care  Updates made by Lynne Logan, RN since 11/17/2020 12:00 AM     Problem: No plan of care established for management of chronic disease states (DM, CKD Stage 4, Hypertensive Nephropathy, Diabetic mononeuropathy associated with type 2 Diabetes, Vitamin D deficiency, HLD)   Priority: High     Long-Range Goal: Establish plan of care for management of chronic disease states (DM,  CKD Stage 4, Hypertensive Nephropathy, Diabetic mononeuropathy associated with type 2 Diabetes, Vitamin D deficiency, HLD)   Start Date: 11/17/2020  Expected End Date: 11/17/2021  This Visit's Progress: On track  Priority: High  Note:   Current Barriers:  Knowledge Deficits related to plan of care for management of DM, CKD Stage 4, Hypertensive Nephropathy, Diabetic mononeuropathy associated with type 2 Diabetes, Vitamin D deficiency, HLD Chronic Disease Management support and education needs related to DM, CKD Stage 4, Hypertensive Nephropathy, Diabetic mononeuropathy associated with type 2 Diabetes, Vitamin D deficiency, HLD  RNCM Clinical Goal(s):  Patient will verbalize basic understanding of  DM, CKD Stage 4, Hypertensive Nephropathy, Diabetic mononeuropathy associated with type 2 Diabetes, Vitamin D deficiency, HLD disease process and self health management plan   take all medications exactly as prescribed and will call provider for medication related questions demonstrate Improved health management independence   continue to work with RN Care Manager to address care management and care coordination needs related to  DM, CKD Stage 4, Hypertensive Nephropathy, Diabetic mononeuropathy associated with type 2 Diabetes, Vitamin D deficiency, HLD will demonstrate ongoing self health care management ability  through collaboration with Consulting civil engineer, provider, and care team.   Interventions: 1:1 collaboration with primary care provider regarding development and update of comprehensive plan of care as evidenced by provider attestation and co-signature Inter-disciplinary care team collaboration (see longitudinal plan of care) Evaluation of current treatment plan related to  self management and patient's adherence to plan as established by provider  Hyperlipidemia Interventions: Medication review performed; medication list updated in electronic medical record.  Provider established cholesterol  goals reviewed; Counseled on importance of regular laboratory monitoring as prescribed; Reviewed importance of limiting foods high in cholesterol; Reviewed exercise goals and target of 150 minutes per week; Assessed social determinant of health barriers;   Goal on track:  Yes Evaluation of current treatment plan related to  Vitamin D deficiency  , self-management and patient's adherence to plan as established by provider. Provided education to patient about basic disease process for Vitamin D deficiency; educated on role of Vitamin D and potential complications of deficiency Review of patient status, including review of consultant's reports, relevant laboratory and other test results, and medications completed. Provided education to patient re: importance to eat a Vitamin D rich diet, get at least 15 minutes of natural sunlight when possible; Reviewed medications with patient and discussed indication, frequency and dosage of prescribed Vitamin D supplement; Discussed plans with patient for ongoing care management follow up and provided patient with direct contact information for care management team  Diabetes Interventions: Assessed patient's understanding of A1c goal: <6.5% Provided education to patient about basic DM disease process; Reviewed medications with patient and discussed importance of medication adherence; Counseled on importance of regular laboratory monitoring as prescribed; Discussed plans with patient for ongoing care management follow up and provided patient with direct contact information for care management team; Provided patient with written educational materials related to hypo and hyperglycemia and importance of correct treatment; Advised patient, providing education and rationale, to check cbg daily before meals and at bedtime and record, calling PCP and or RN CM for findings outside established parameters; Review of patient status, including review of consultants  reports, relevant laboratory and other test results, and medications completed; Lab Results  Component Value Date   HGBA1C 6.7 (H) 10/06/2020   Patient Goals/Self-Care Activities: Patient will self administer medications as prescribed Patient will attend all scheduled provider appointments Patient will call pharmacy for medication refills Patient will attend church or other social activities Patient will continue to perform ADL's independently Patient will continue to perform IADL's independently Patient will call provider office for new concerns or questions  Follow Up Plan:  Telephone follow up appointment with care management team member scheduled for:  12/17/20     Plan:Telephone follow up appointment with care management team member scheduled for:  12/17/20  Barb Merino, RN, BSN, CCM Care Management Coordinator [place] [facility]  Direct Phone: [phone]

## 2020-11-18 NOTE — Patient Instructions (Signed)
Visit Information   PATIENT GOALS/PLAN OF CARE:   Care Plan : RN Care Manager Plan of Care  Updates made by Lynne Logan, RN since 11/17/2020 12:00 AM     Problem: No plan of care established for management of chronic disease states (DM, CKD Stage 4, Hypertensive Nephropathy, Diabetic mononeuropathy associated with type 2 Diabetes, Vitamin D deficiency, HLD)   Priority: High     Long-Range Goal: Establish plan of care for management of chronic disease states (DM, CKD Stage 4, Hypertensive Nephropathy, Diabetic mononeuropathy associated with type 2 Diabetes, Vitamin D deficiency, HLD)   Start Date: 11/17/2020  Expected End Date: 11/17/2021  This Visit's Progress: On track  Priority: High  Note:   Current Barriers:  Knowledge Deficits related to plan of care for management of DM, CKD Stage 4, Hypertensive Nephropathy, Diabetic mononeuropathy associated with type 2 Diabetes, Vitamin D deficiency, HLD Chronic Disease Management support and education needs related to DM, CKD Stage 4, Hypertensive Nephropathy, Diabetic mononeuropathy associated with type 2 Diabetes, Vitamin D deficiency, HLD  RNCM Clinical Goal(s):  Patient will verbalize basic understanding of  DM, CKD Stage 4, Hypertensive Nephropathy, Diabetic mononeuropathy associated with type 2 Diabetes, Vitamin D deficiency, HLD disease process and self health management plan   take all medications exactly as prescribed and will call provider for medication related questions demonstrate Improved health management independence   continue to work with RN Care Manager to address care management and care coordination needs related to  DM, CKD Stage 4, Hypertensive Nephropathy, Diabetic mononeuropathy associated with type 2 Diabetes, Vitamin D deficiency, HLD will demonstrate ongoing self health care management ability    through collaboration with RN Care manager, provider, and care team.   Interventions: 1:1 collaboration with primary  care provider regarding development and update of comprehensive plan of care as evidenced by provider attestation and co-signature Inter-disciplinary care team collaboration (see longitudinal plan of care) Evaluation of current treatment plan related to  self management and patient's adherence to plan as established by provider  Hyperlipidemia Interventions: Medication review performed; medication list updated in electronic medical record.  Provider established cholesterol goals reviewed; Counseled on importance of regular laboratory monitoring as prescribed; Reviewed importance of limiting foods high in cholesterol; Reviewed exercise goals and target of 150 minutes per week; Assessed social determinant of health barriers;   Goal on track:  Yes Evaluation of current treatment plan related to  Vitamin D deficiency  , self-management and patient's adherence to plan as established by provider. Provided education to patient about basic disease process for Vitamin D deficiency; educated on role of Vitamin D and potential complications of deficiency Review of patient status, including review of consultant's reports, relevant laboratory and other test results, and medications completed. Provided education to patient re: importance to eat a Vitamin D rich diet, get at least 15 minutes of natural sunlight when possible; Reviewed medications with patient and discussed indication, frequency and dosage of prescribed Vitamin D supplement; Discussed plans with patient for ongoing care management follow up and provided patient with direct contact information for care management team  Diabetes Interventions: Assessed patient's understanding of A1c goal: <6.5% Provided education to patient about basic DM disease process; Reviewed medications with patient and discussed importance of medication adherence; Counseled on importance of regular laboratory monitoring as prescribed; Discussed plans with patient for  ongoing care management follow up and provided patient with direct contact information for care management team; Provided patient with written educational materials related to hypo  and hyperglycemia and importance of correct treatment; Advised patient, providing education and rationale, to check cbg daily before meals and at bedtime and record, calling PCP and or RN CM for findings outside established parameters; Review of patient status, including review of consultants reports, relevant laboratory and other test results, and medications completed; Lab Results  Component Value Date   HGBA1C 6.7 (H) 10/06/2020   Patient Goals/Self-Care Activities: Patient will self administer medications as prescribed Patient will attend all scheduled provider appointments Patient will call pharmacy for medication refills Patient will attend church or other social activities Patient will continue to perform ADL's independently Patient will continue to perform IADL's independently Patient will call provider office for new concerns or questions  Follow Up Plan:  Telephone follow up appointment with care management team member scheduled for:  12/17/20      Consent to CCM Services: Mr. Whitner was given information about Chronic Care Management services including:  CCM service includes personalized support from designated clinical staff supervised by his physician, including individualized plan of care and coordination with other care providers 24/7 contact phone numbers for assistance for urgent and routine care needs. Service will only be billed when office clinical staff spend 20 minutes or more in a month to coordinate care. Only one practitioner may furnish and bill the service in a calendar month. The patient may stop CCM services at any time (effective at the end of the month) by phone call to the office staff. The patient will be responsible for cost sharing (co-pay) of up to 20% of the service fee  (after annual deductible is met).  Patient agreed to services and verbal consent obtained.   The patient verbalized understanding of instructions, educational materials, and care plan provided today and declined offer to receive copy of patient instructions, educational materials, and care plan.   Telephone follow up appointment with care management team member scheduled for: 12/17/20  Barb Merino, RN, BSN, CCM Care Management Coordinator [place] [facility]  Direct Phone: [phone]

## 2020-12-04 DIAGNOSIS — I639 Cerebral infarction, unspecified: Secondary | ICD-10-CM | POA: Diagnosis not present

## 2020-12-04 DIAGNOSIS — G8929 Other chronic pain: Secondary | ICD-10-CM | POA: Diagnosis not present

## 2020-12-04 DIAGNOSIS — E1122 Type 2 diabetes mellitus with diabetic chronic kidney disease: Secondary | ICD-10-CM | POA: Diagnosis not present

## 2020-12-04 DIAGNOSIS — S42201D Unspecified fracture of upper end of right humerus, subsequent encounter for fracture with routine healing: Secondary | ICD-10-CM | POA: Diagnosis not present

## 2020-12-08 DIAGNOSIS — I129 Hypertensive chronic kidney disease with stage 1 through stage 4 chronic kidney disease, or unspecified chronic kidney disease: Secondary | ICD-10-CM

## 2020-12-08 DIAGNOSIS — E0841 Diabetes mellitus due to underlying condition with diabetic mononeuropathy: Secondary | ICD-10-CM | POA: Diagnosis not present

## 2020-12-08 DIAGNOSIS — E1122 Type 2 diabetes mellitus with diabetic chronic kidney disease: Secondary | ICD-10-CM

## 2020-12-08 DIAGNOSIS — E785 Hyperlipidemia, unspecified: Secondary | ICD-10-CM

## 2020-12-08 DIAGNOSIS — N184 Chronic kidney disease, stage 4 (severe): Secondary | ICD-10-CM

## 2020-12-08 DIAGNOSIS — Z794 Long term (current) use of insulin: Secondary | ICD-10-CM

## 2020-12-15 ENCOUNTER — Other Ambulatory Visit: Payer: Self-pay

## 2020-12-15 ENCOUNTER — Ambulatory Visit (INDEPENDENT_AMBULATORY_CARE_PROVIDER_SITE_OTHER): Payer: Medicare Other | Admitting: Nurse Practitioner

## 2020-12-15 ENCOUNTER — Encounter: Payer: Self-pay | Admitting: Nurse Practitioner

## 2020-12-15 VITALS — BP 118/60 | HR 90 | Temp 98.0°F | Ht 65.4 in | Wt 202.2 lb

## 2020-12-15 DIAGNOSIS — E1165 Type 2 diabetes mellitus with hyperglycemia: Secondary | ICD-10-CM

## 2020-12-15 DIAGNOSIS — Z794 Long term (current) use of insulin: Secondary | ICD-10-CM

## 2020-12-15 DIAGNOSIS — E785 Hyperlipidemia, unspecified: Secondary | ICD-10-CM

## 2020-12-15 DIAGNOSIS — N184 Chronic kidney disease, stage 4 (severe): Secondary | ICD-10-CM | POA: Diagnosis not present

## 2020-12-15 DIAGNOSIS — Z0000 Encounter for general adult medical examination without abnormal findings: Secondary | ICD-10-CM

## 2020-12-15 DIAGNOSIS — E0841 Diabetes mellitus due to underlying condition with diabetic mononeuropathy: Secondary | ICD-10-CM

## 2020-12-15 DIAGNOSIS — R82998 Other abnormal findings in urine: Secondary | ICD-10-CM

## 2020-12-15 DIAGNOSIS — E6609 Other obesity due to excess calories: Secondary | ICD-10-CM

## 2020-12-15 DIAGNOSIS — E1122 Type 2 diabetes mellitus with diabetic chronic kidney disease: Secondary | ICD-10-CM | POA: Diagnosis not present

## 2020-12-15 DIAGNOSIS — H6123 Impacted cerumen, bilateral: Secondary | ICD-10-CM | POA: Diagnosis not present

## 2020-12-15 DIAGNOSIS — I129 Hypertensive chronic kidney disease with stage 1 through stage 4 chronic kidney disease, or unspecified chronic kidney disease: Secondary | ICD-10-CM | POA: Diagnosis not present

## 2020-12-15 DIAGNOSIS — Z6833 Body mass index (BMI) 33.0-33.9, adult: Secondary | ICD-10-CM

## 2020-12-15 LAB — POCT URINALYSIS DIPSTICK
Bilirubin, UA: NEGATIVE
Glucose, UA: NEGATIVE
Ketones, UA: NEGATIVE
Nitrite, UA: NEGATIVE
Protein, UA: NEGATIVE
Spec Grav, UA: 1.025 (ref 1.010–1.025)
Urobilinogen, UA: 0.2 E.U./dL
pH, UA: 5.5 (ref 5.0–8.0)

## 2020-12-15 LAB — POCT UA - MICROALBUMIN
Albumin/Creatinine Ratio, Urine, POC: <30
Creatinine, POC: 100 mg/dL
Microalbumin Ur, POC: 30 mg/L

## 2020-12-15 NOTE — Progress Notes (Signed)
This visit occurred during the SARS-CoV-2 public health emergency.  Safety protocols were in place, including screening questions prior to the visit, additional usage of staff PPE, and extensive cleaning of exam room while observing appropriate contact time as indicated for disinfecting solutions.  Subjective:     Patient ID: Justin Holloway , male    DOB: July 09, 1929 , 87 y.o.   MRN: [id number]   Chief Complaint  Patient presents with   Annual Exam    HPI  Patient presents today for hm. He is doing good and does not have any concerns today.  He is eating healthy. He had a eye exam this year.  He sees the cardiologist and nephrologist.  Wt Readings from Last 3 Encounters: 12/15/20 : 202 lb 3.2 oz (91.7 kg) 11/16/20 : 205 lb (93 kg) 10/06/20 : 204 lb 6.4 oz (92.7 kg)      Past Medical History:  Diagnosis Date   Arthritis    CKD (chronic kidney disease), stage IV (HCC)    Diabetes mellitus without complication ([place])    Glaucoma    "blurred vision", see Dr Gershon Crane yearly   Hyperlipidemia    Hypertension    TIA (transient ischemic attack)      Family History  Problem Relation Age of Onset   Cancer Mother 45       breast   COPD Father 3       Congestive heart failure     Current Outpatient Medications:    Ascorbic Acid (VITAMIN C) 1000 MG tablet, Take 1,000 mg by mouth daily. , Disp: , Rfl:    aspirin EC 81 MG tablet, Take 81 mg by mouth every evening., Disp: , Rfl:    Blood Glucose Monitoring Suppl (ONE TOUCH ULTRA 2) w/Device KIT, Use as directed to check blood sugars 2 times per day dx: e11.22, Disp: 1 kit, Rfl: 0   cholecalciferol (VITAMIN D) 1000 units tablet, Take 2,000 Units by mouth daily. , Disp: , Rfl:    COMBIGAN 0.2-0.5 % ophthalmic solution, Place 1 drop into both eyes every 12 (twelve) hours., Disp: , Rfl:    diclofenac Sodium (VOLTAREN) 1 % GEL, Apply 4 g topically 4 (four) times daily. (Patient taking differently: Apply 2-4 g topically in the morning  and at bedtime. shoulder), Disp: 100 g, Rfl: 0   DULoxetine (CYMBALTA) 30 MG capsule, Take 1 capsule (30 mg total) by mouth daily., Disp: 30 capsule, Rfl: 5   furosemide (LASIX) 80 MG tablet, TAKE 1 TABLET BY MOUTH IN  THE MORNING AND ONE-HALF  TABLET BY MOUTH IN THE  EARLY EVENING (Patient taking differently: Take 40-80 mg by mouth See admin instructions. 80 mg in the morning and 40 mg in the evening.), Disp: 135 tablet, Rfl: 3   glucose blood (ONETOUCH ULTRA) test strip, USE STRIPS TO TEST BLOOD  SUGAR 3 TIMES DAILY 1 STRIP PER TEST, Disp: 300 strip, Rfl: 3   hydrALAZINE (APRESOLINE) 10 MG tablet, Take 10 mg by mouth 3 (three) times daily., Disp: , Rfl:    insulin degludec (TRESIBA) 200 UNIT/ML FlexTouch Pen, Inject 24 Units into the skin at bedtime., Disp: 3 mL, Rfl: 1   Iron-FA-B Cmp-C-Biot-Probiotic (FUSION PLUS) CAPS, Take by mouth. 1 capsule per day (Patient not taking: Reported on 11/16/2020), Disp: , Rfl:    Lancets (ONETOUCH DELICA PLUS [id number]) MISC, 100 each by Does not apply route in the morning, at noon, and at bedtime., Disp: 300 each, Rfl: 3   LUMIGAN 0.01 %  SOLN, Place 1 drop into both eyes 2 (two) times daily. , Disp: , Rfl:    Multiple Vitamin (MULTIVITAMIN WITH MINERALS) TABS tablet, Take 1 tablet by mouth daily., Disp: , Rfl:    polyethylene glycol powder (GLYCOLAX/MIRALAX) 17 GM/SCOOP powder, Take 17 g by mouth daily as needed for mild constipation., Disp: , Rfl:    pravastatin (PRAVACHOL) 40 MG tablet, Take 1 tablet (40 mg total) by mouth daily. (Patient taking differently: Take 40 mg by mouth at bedtime.), Disp: 90 tablet, Rfl: 3   pregabalin (LYRICA) 25 MG capsule, Take 1 capsule (25 mg total) by mouth at bedtime. (Patient not taking: Reported on 11/16/2020), Disp: 90 capsule, Rfl: 0   Semaglutide,0.25 or 0.5MG/DOS, (OZEMPIC, 0.25 OR 0.5 MG/DOSE,) 2 MG/1.5ML SOPN, Inject 0.5 mg into the skin once a week., Disp: 1.5 mL, Rfl: 1   tamsulosin (FLOMAX) 0.4 MG CAPS capsule, Take 0.4  mg by mouth daily., Disp: , Rfl:    Allergies  Allergen Reactions   Gabapentin Itching     Men's preventive visit. Patient Health Questionnaire (PHQ-2) is  Flowsheet Row Clinical Support from 08/18/2020 in [facility]  PHQ-2 Total Score 0     . Patient is on a healthy diet. Marital status: Married. Relevant history for alcohol use is:  Social History   Substance and Sexual Activity  Alcohol Use Never  . Relevant history for tobacco use is:  Social History   Tobacco Use  Smoking Status Former   Types: Cigars  Smokeless Tobacco Never  Tobacco Comments   socially, not long  .   Review of Systems  Constitutional: Negative.  Negative for chills and fever.  HENT: Negative.  Negative for congestion, rhinorrhea, sinus pain and sore throat.   Eyes: Negative.   Respiratory: Negative.  Negative for cough, shortness of breath and wheezing.   Cardiovascular: Negative.  Negative for chest pain and palpitations.  Gastrointestinal:  Negative for abdominal pain, constipation, diarrhea and nausea.  Endocrine: Negative.  Negative for polydipsia, polyphagia and polyuria.  Genitourinary:  Positive for frequency.       Takes lasix wakes up in the night to urinate   Musculoskeletal: Negative.  Negative for arthralgias and myalgias.  Skin: Negative.   Neurological: Negative.  Negative for weakness.  Hematological: Negative.   Psychiatric/Behavioral: Negative.  Negative for confusion and sleep disturbance.     Today's Vitals   12/15/20 1422  BP: 118/60  Pulse: 90  Temp: 98 F (36.7 C)  TempSrc: Oral  Weight: 202 lb 3.2 oz (91.7 kg)  Height: 5' 5.4" (1.661 m)   Body mass index is 33.24 kg/m.   Objective:  Physical Exam Vitals and nursing note reviewed.  Constitutional:      Appearance: Normal appearance.  HENT:     Head: Normocephalic and atraumatic.     Right Ear: Tympanic membrane, ear canal and external ear normal. There is impacted cerumen.     Left  Ear: Tympanic membrane, ear canal and external ear normal. There is impacted cerumen.     Nose: No congestion or rhinorrhea.     Comments: Masked     Mouth/Throat:     Comments: Masked  Eyes:     Extraocular Movements: Extraocular movements intact.     Conjunctiva/sclera: Conjunctivae normal.     Pupils: Pupils are equal, round, and reactive to light.  Cardiovascular:     Rate and Rhythm: Normal rate and regular rhythm.     Pulses: Normal pulses.  Heart sounds: Normal heart sounds. No murmur heard. Pulmonary:     Effort: Pulmonary effort is normal. No respiratory distress.     Breath sounds: Normal breath sounds. No wheezing.  Chest:  Breasts:    Right: Normal. No swelling, bleeding, inverted nipple, mass or nipple discharge.     Left: Normal. No swelling, bleeding, inverted nipple, mass or nipple discharge.  Abdominal:     General: Abdomen is flat. Bowel sounds are normal.     Palpations: Abdomen is soft.  Genitourinary:    Comments: deferred Musculoskeletal:        General: Normal range of motion.     Cervical back: Normal range of motion and neck supple.     Comments: Ambulates with walker   Skin:    General: Skin is warm and dry.     Capillary Refill: Capillary refill takes less than 2 seconds.  Neurological:     Mental Status: He is alert and oriented to person, place, and time.  Psychiatric:        Mood and Affect: Mood normal.        Behavior: Behavior normal.        Assessment And Plan:    1. Encounter for general adult medical examination w/o abnormal findings --Patient is here for their annual physical exam and we discussed any changes to medication and medical history.  -Behavior modification was discussed as well as diet and exercise history  -Patient will continue to exercise regularly and modify their diet.  -Recommendation for yearly physical annuals, immunization and screenings including mammogram and colonoscopy were discussed with the patient.   -Recommended intake of multivitamin, vitamin D and calcium.  -Individualized advise was given to the patient pertaining to their own health history in regards to diet, exercise, medical condition and referrals.   2. Type 2 diabetes mellitus with stage 4 chronic kidney disease, with long-term current use of insulin (HCC) Chronic, stable, will check labs today   - Hemoglobin A1c  3. CKD (chronic kidney disease) stage 4, GFR 15-29 ml/min (HCC) -Will check labs  -Followed by nephrologist   4. Hypertensive nephropathy -Chronic, controlled. Encouraged to follow low sodium diet.  - CMP14+EGFR - CBC no Diff  5. Hyperlipidemia, unspecified hyperlipidemia type - Lipid panel  6. Impacted cerumen of both ears -Cleaned and flushed both ears with curette.   7. Class 1 obesity due to excess calories with serious comorbidity and body mass index (BMI) of 33.0 to 33.9 in adult -Advised patient on a healthy diet including avoiding fast food and red meats. Increase the intake of lean meats including grilled chicken and [place].  Drink a lot of water. Decrease intake of fatty foods. Exercise for 30-45 min. 4-5 a week to decrease the risk of cardiac event.   7. Diabetic mononeuropathy associated with diabetes mellitus due to underlying condition ([place]) -Would like to get PT again to help with his neuropathy and build up his strength. -Referral for PT             Follow up: if symptoms persist or do not get better.   Follow up: if symptoms persist or do not get better.   Side effects and appropriate use of all the medication(s) were discussed with the patient today. Patient advised to use the medication(s) as directed by their healthcare provider. The patient was encouraged to read, review, and understand all associated package inserts and contact our office with any questions or concerns. The patient accepts the risks of  the treatment plan and had an opportunity to ask questions.   Patient was  given opportunity to ask questions. Patient verbalized understanding of the plan and was able to repeat key elements of the plan. All questions were answered to their satisfaction.  Raman Cing [place], DNP   I, Raman Lorieann Argueta have reviewed all documentation for this visit. The documentation on 12/15/20 for the exam, diagnosis, procedures, and orders are all accurate and complete.    THE PATIENT IS ENCOURAGED TO PRACTICE SOCIAL DISTANCING DUE TO THE COVID-19 PANDEMIC.

## 2020-12-15 NOTE — Patient Instructions (Signed)

## 2020-12-15 NOTE — Addendum Note (Signed)
Addended by: Roxine Caddy on: 12/15/2020 04:54 PM   Modules accepted: Orders

## 2020-12-16 ENCOUNTER — Other Ambulatory Visit: Payer: Self-pay | Admitting: Nurse Practitioner

## 2020-12-16 LAB — CBC
Hematocrit: 34 % — ABNORMAL LOW (ref 37.5–51.0)
Hemoglobin: 11.2 g/dL — ABNORMAL LOW (ref 13.0–17.7)
MCH: 29.3 pg (ref 26.6–33.0)
MCHC: 32.9 g/dL (ref 31.5–35.7)
MCV: 89 fL (ref 79–97)
Platelets: 223 10*3/uL (ref 150–450)
RBC: 3.82 x10E6/uL — ABNORMAL LOW (ref 4.14–5.80)
RDW: 12.7 % (ref 11.6–15.4)
WBC: 5.4 10*3/uL (ref 3.4–10.8)

## 2020-12-16 LAB — CMP14+EGFR
ALT: 7 IU/L (ref 0–44)
AST: 16 IU/L (ref 0–40)
Albumin/Globulin Ratio: 1.8 (ref 1.2–2.2)
Albumin: 4.4 g/dL (ref 3.6–4.6)
Alkaline Phosphatase: 92 IU/L (ref 44–121)
BUN/Creatinine Ratio: 16 (ref 10–24)
BUN: 51 mg/dL — ABNORMAL HIGH (ref 8–27)
Bilirubin Total: 0.4 mg/dL (ref 0.0–1.2)
CO2: 24 mmol/L (ref 20–29)
Calcium: 9.2 mg/dL (ref 8.6–10.2)
Chloride: 99 mmol/L (ref 96–106)
Creatinine, Ser: 3.27 mg/dL — ABNORMAL HIGH (ref 0.76–1.27)
Globulin, Total: 2.5 g/dL (ref 1.5–4.5)
Glucose: 109 mg/dL — ABNORMAL HIGH (ref 70–99)
Potassium: 5.2 mmol/L (ref 3.5–5.2)
Sodium: 137 mmol/L (ref 134–144)
Total Protein: 6.9 g/dL (ref 6.0–8.5)
eGFR: 17 mL/min/{1.73_m2} — ABNORMAL LOW (ref >59)

## 2020-12-16 LAB — LIPID PANEL
Chol/HDL Ratio: 3.2 ratio (ref 0.0–5.0)
Cholesterol, Total: 171 mg/dL (ref 100–199)
HDL: 54 mg/dL (ref >39)
LDL Chol Calc (NIH): 104 mg/dL — ABNORMAL HIGH (ref 0–99)
Triglycerides: 66 mg/dL (ref 0–149)
VLDL Cholesterol Cal: 13 mg/dL (ref 5–40)

## 2020-12-16 LAB — HEMOGLOBIN A1C
Est. average glucose Bld gHb Est-mCnc: 120 mg/dL
Hgb A1c MFr Bld: 5.8 % — ABNORMAL HIGH (ref 4.8–5.6)

## 2020-12-17 ENCOUNTER — Telehealth: Payer: Medicare Other

## 2020-12-17 ENCOUNTER — Ambulatory Visit: Payer: Self-pay

## 2020-12-17 DIAGNOSIS — E559 Vitamin D deficiency, unspecified: Secondary | ICD-10-CM

## 2020-12-17 DIAGNOSIS — E785 Hyperlipidemia, unspecified: Secondary | ICD-10-CM

## 2020-12-17 DIAGNOSIS — E0841 Diabetes mellitus due to underlying condition with diabetic mononeuropathy: Secondary | ICD-10-CM

## 2020-12-17 DIAGNOSIS — E1122 Type 2 diabetes mellitus with diabetic chronic kidney disease: Secondary | ICD-10-CM

## 2020-12-17 DIAGNOSIS — Z794 Long term (current) use of insulin: Secondary | ICD-10-CM

## 2020-12-17 DIAGNOSIS — I129 Hypertensive chronic kidney disease with stage 1 through stage 4 chronic kidney disease, or unspecified chronic kidney disease: Secondary | ICD-10-CM

## 2020-12-17 DIAGNOSIS — N184 Chronic kidney disease, stage 4 (severe): Secondary | ICD-10-CM

## 2020-12-17 NOTE — Patient Instructions (Signed)
Visit Information   Thank you for taking time to visit with me today. Please don't hesitate to contact me if I can be of assistance to you before our next scheduled telephone appointment.  Following are the goals we discussed today:  (Copy and paste patient goals from clinical care plan here)  Our next appointment is by telephone on 02/17/21 at 12:00 noon  Please call the care guide team at [phone] if you need to cancel or reschedule your appointment.   If you are experiencing a Mental Health or [place] or need someone to talk to, please call 1-800-273-TALK (toll free, 24 hour hotline)   Following is a copy of your full care plan:  Care Plan : [place] of Care  Updates made by Lynne Logan, RN since 12/17/2020 12:00 AM     Problem: No plan of care established for management of chronic disease states (DM, CKD Stage 4, Hypertensive Nephropathy, Diabetic mononeuropathy associated with type 2 Diabetes, Vitamin D deficiency, HLD)   Priority: High     Long-Range Goal: Establish plan of care for management of chronic disease states (DM, CKD Stage 4, Hypertensive Nephropathy, Diabetic mononeuropathy associated with type 2 Diabetes, Vitamin D deficiency, HLD)   Start Date: 11/17/2020  Expected End Date: 11/17/2021  Recent Progress: On track  Priority: High  Note:   Current Barriers:  Knowledge Deficits related to plan of care for management of DM, CKD Stage 4, Hypertensive Nephropathy, Diabetic mononeuropathy associated with type 2 Diabetes, Vitamin D deficiency, HLD Chronic Disease Management support and education needs related to DM, CKD Stage 4, Hypertensive Nephropathy, Diabetic mononeuropathy associated with type 2 Diabetes, Vitamin D deficiency, HLD  RNCM Clinical Goal(s):  Patient will verbalize basic understanding of  DM, CKD Stage 4, Hypertensive Nephropathy, Diabetic mononeuropathy associated with type 2 Diabetes, Vitamin D deficiency, HLD disease  process and self health management plan   take all medications exactly as prescribed and will call provider for medication related questions demonstrate Improved health management independence   continue to work with RN Care Manager to address care management and care coordination needs related to  DM, CKD Stage 4, Hypertensive Nephropathy, Diabetic mononeuropathy associated with type 2 Diabetes, Vitamin D deficiency, HLD will demonstrate ongoing self health care management ability    through collaboration with RN Care manager, provider, and care team.   Interventions: 1:1 collaboration with primary care provider regarding development and update of comprehensive plan of care as evidenced by provider attestation and co-signature Inter-disciplinary care team collaboration (see longitudinal plan of care) Evaluation of current treatment plan related to  self management and patient's adherence to plan as established by provider  Hyperlipidemia Interventions: (Status: Goal on track: Yes) Medication review performed; medication list updated in electronic medical record.  Review of patient status, including review of consultant's reports, relevant laboratory and other test results, and medications completed. Provider established cholesterol goals reviewed Provided HLD educational materials Reviewed importance of limiting foods high in cholesterol Reviewed exercise goals and target of 150 minutes per week Assessed social determinant of health barriers  Reviewed and discussed PCP referral for in home PT, patient/wife aware of next steps Printed mailed educational materials related to "The Skinny on Fats"; "Cooking to lower Cholesterol"  Goal on track:  Yes Evaluation of current treatment plan related to  Vitamin D deficiency  , self-management and patient's adherence to plan as established by provider. Provided education to patient about basic disease process for Vitamin D deficiency;  educated on role  of Vitamin D and potential complications of deficiency Review of patient status, including review of consultant's reports, relevant laboratory and other test results, and medications completed. Provided education to patient re: importance to eat a Vitamin D rich diet, get at least 15 minutes of natural sunlight when possible; Reviewed medications with patient and discussed indication, frequency and dosage of prescribed Vitamin D supplement; Discussed plans with patient for ongoing care management follow up and provided patient with direct contact information for care management team  Diabetes Interventions:(Status: Goal on track: Yes) Assessed patient's understanding of A1c goal: <6.5% Provided education to patient about basic DM disease process Reviewed medications with patient and discussed importance of medication adherence Advised patient, providing education and rationale, to check cbg daily before meals and at bedtime and record, calling PCP and or RN CM for findings outside established parameters Review of patient status, including review of consultants reports, relevant laboratory and other test results, and medications completed Determined patient is working with the embedded Pharm D to complete PAP for Ozempic   Lab Results  Component Value Date   HGBA1C 5.8 (H) 12/15/2020      Chronic Kidney Disease Interventions:  (Status:  New goal.) Long Term Goal Evaluation of current treatment plan related to chronic kidney disease self management and patient's adherence to plan as established by provider      Reviewed prescribed diet per kidney specialist/renal nutritionist  Advised patient to discuss fluid restriction with provider    Assessed social determinant of health barriers    Provided education on kidney disease progression    Engage patient in early, proactive and ongoing discussion about goals of care and what matters most to them    Mailed printed educational materials related  to Eating Right with Chronic Kidney disease Last practice recorded BP readings:  BP Readings from Last 3 Encounters:  12/15/20 118/60  11/16/20 (!) 115/56  10/06/20 130/78  Most recent eGFR/CrCl:  Lab Results  Component Value Date   EGFR 17 (L) 12/15/2020    No components found for: CRCL   Patient Goals/Self-Care Activities: Take all medications as prescribed Attend all scheduled provider appointments Call pharmacy for medication refills 3-7 days in advance of running out of medications Perform all self care activities independently  Call provider office for new concerns or questions  Work with embedded Pharm D for Ozempic PAP drink 6 to 8 glasses of water each day manage portion size  Follow Up Plan:  Telephone follow up appointment with care management team member scheduled for:  02/17/21      Consent to CCM Services: Justin Holloway was given information about Chronic Care Management services including:  CCM service includes personalized support from designated clinical staff supervised by his physician, including individualized plan of care and coordination with other care providers 24/7 contact phone numbers for assistance for urgent and routine care needs. Service will only be billed when office clinical staff spend 20 minutes or more in a month to coordinate care. Only one practitioner may furnish and bill the service in a calendar month. The patient may stop CCM services at any time (effective at the end of the month) by phone call to the office staff. The patient will be responsible for cost sharing (co-pay) of up to 20% of the service fee (after annual deductible is met).  Patient agreed to services and verbal consent obtained.   Patient verbalizes understanding of instructions provided today and agrees to view in [place].  Telephone follow up appointment with care management team member scheduled for: 02/17/21

## 2020-12-17 NOTE — Chronic Care Management (AMB) (Signed)
Chronic Care Management   CCM RN Visit Note  12/17/2020 Name: Justin Holloway MRN: [id number] DOB: 1929-12-11  Subjective: Justin Holloway is a 87 y.o. year old male who is a primary care patient of [name], MD. The care management team was consulted for assistance with disease management and care coordination needs.    Engaged with patient by telephone for follow up visit in response to provider referral for case management and/or care coordination services.   Consent to Services:  The patient was given information about Chronic Care Management services, agreed to services, and gave verbal consent prior to initiation of services.  Please see initial visit note for detailed documentation.   Patient agreed to services and verbal consent obtained.   Assessment: Review of patient past medical history, allergies, medications, health status, including review of consultants reports, laboratory and other test data, was performed as part of comprehensive evaluation and provision of chronic care management services.   SDOH (Social Determinants of Health) assessments and interventions performed:  yes, no acute challenges  CCM Care Plan  Allergies  Allergen Reactions   Gabapentin Itching    Outpatient Encounter Medications as of 12/17/2020  Medication Sig   Ascorbic Acid (VITAMIN C) 1000 MG tablet Take 1,000 mg by mouth daily.    aspirin EC 81 MG tablet Take 81 mg by mouth every evening.   Blood Glucose Monitoring Suppl (ONE TOUCH ULTRA 2) w/Device KIT Use as directed to check blood sugars 2 times per day dx: e11.22   cholecalciferol (VITAMIN D) 1000 units tablet Take 2,000 Units by mouth daily.    COMBIGAN 0.2-0.5 % ophthalmic solution Place 1 drop into both eyes every 12 (twelve) hours.   diclofenac Sodium (VOLTAREN) 1 % GEL Apply 4 g topically 4 (four) times daily. (Patient taking differently: Apply 2-4 g topically in the morning and at bedtime. shoulder)   DULoxetine (CYMBALTA)  30 MG capsule Take 1 capsule (30 mg total) by mouth daily.   furosemide (LASIX) 80 MG tablet TAKE 1 TABLET BY MOUTH IN  THE MORNING AND ONE-HALF  TABLET BY MOUTH IN THE  EARLY EVENING (Patient taking differently: Take 40-80 mg by mouth See admin instructions. 80 mg in the morning and 40 mg in the evening.)   glucose blood (ONETOUCH ULTRA) test strip USE STRIPS TO TEST BLOOD  SUGAR 3 TIMES DAILY 1 STRIP PER TEST   hydrALAZINE (APRESOLINE) 10 MG tablet Take 10 mg by mouth 3 (three) times daily.   insulin degludec (TRESIBA) 200 UNIT/ML FlexTouch Pen Inject 24 Units into the skin at bedtime.   Iron-FA-B Cmp-C-Biot-Probiotic (FUSION PLUS) CAPS Take by mouth. 1 capsule per day (Patient not taking: Reported on 11/16/2020)   Lancets (ONETOUCH DELICA PLUS [id number]) MISC 100 each by Does not apply route in the morning, at noon, and at bedtime.   LUMIGAN 0.01 % SOLN Place 1 drop into both eyes 2 (two) times daily.    Multiple Vitamin (MULTIVITAMIN WITH MINERALS) TABS tablet Take 1 tablet by mouth daily.   polyethylene glycol powder (GLYCOLAX/MIRALAX) 17 GM/SCOOP powder Take 17 g by mouth daily as needed for mild constipation.   pravastatin (PRAVACHOL) 40 MG tablet Take 1 tablet (40 mg total) by mouth daily. (Patient taking differently: Take 40 mg by mouth at bedtime.)   pregabalin (LYRICA) 25 MG capsule Take 1 capsule (25 mg total) by mouth at bedtime. (Patient not taking: Reported on 11/16/2020)   Semaglutide,0.25 or 0.5MG/DOS, (OZEMPIC, 0.25 OR 0.5 MG/DOSE,) 2 MG/1.5ML SOPN  Inject 0.5 mg into the skin once a week.   tamsulosin (FLOMAX) 0.4 MG CAPS capsule Take 0.4 mg by mouth daily.   No facility-administered encounter medications on file as of 12/17/2020.    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 34.9 in adult 05/10/2020   DNR (do not resuscitate) 05/10/2020   Hypertension    Hyperlipidemia    Glaucoma    CKD (chronic kidney disease), stage IV ([place])    Secondary renal hyperparathyroidism ([place]) 01/14/2020   Type 2 diabetes mellitus with stage 4 chronic kidney disease, with long-term current use of insulin ([place]) 09/10/2019   Peripheral vascular disease, unspecified ([place]) 05/28/2019   Diabetic neuropathy ([place]) 02/18/2019   Left upper quadrant pain 09/04/2018   Acute midline low back pain without sciatica 09/04/2018   CKD (chronic kidney disease) stage 4, GFR 15-29 ml/min (HCC) 11/21/2017   Hypertensive nephropathy 11/21/2017   Right foot pain 11/21/2017   Vertigo 09/09/2017   BPPV (benign paroxysmal positional vertigo), right 09/06/2017   Sensorineural hearing loss (SNHL), bilateral 09/06/2017   Ataxia 02/06/2017   UTI (lower urinary tract infection) 08/07/2015   Dizziness 08/07/2015   Acute renal failure superimposed on stage 3 chronic kidney disease ([place]) 08/07/2015   Hyperkalemia 08/07/2015   Left inguinal hernia 05/15/2013   Diabetes mellitus without complication ([place]) 23/53/6144   HLD (hyperlipidemia) 03/08/2006   HYPERTENSION, BENIGN SYSTEMIC 03/08/2006   IMPOTENCE, 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   PROTEINURIA 03/08/2006    Conditions to be addressed/monitored: DM, CKD Stage 4, Hypertensive Nephropathy, Diabetic mononeuropathy associated with type 2 Diabetes, Vitamin D deficiency, HLD  Care Plan : RN Care Manager Plan of Care  Updates made by Lynne Logan, RN since 12/17/2020 12:00 AM     Problem: No plan of care established for management of chronic disease states (DM, CKD Stage 4, Hypertensive Nephropathy, Diabetic mononeuropathy associated with type 2 Diabetes, Vitamin D deficiency, HLD)   Priority: High     Long-Range Goal: Establish plan of care for management of chronic disease states (DM,  CKD Stage 4, Hypertensive Nephropathy, Diabetic mononeuropathy associated with type 2 Diabetes, Vitamin D deficiency, HLD)   Start Date: 11/17/2020  Expected End Date: 11/17/2021  Recent Progress: On track  Priority: High  Note:   Current Barriers:  Knowledge Deficits related to plan of care for management of DM, CKD Stage 4, Hypertensive Nephropathy, Diabetic mononeuropathy associated with type 2 Diabetes, Vitamin D deficiency, HLD Chronic Disease Management support and education needs related to DM, CKD Stage 4, Hypertensive Nephropathy, Diabetic mononeuropathy associated with type 2 Diabetes, Vitamin D deficiency, HLD  RNCM Clinical Goal(s):  Patient will verbalize basic understanding of  DM, CKD Stage 4, Hypertensive Nephropathy, Diabetic mononeuropathy associated with type 2 Diabetes, Vitamin D deficiency, HLD disease process and self health management plan   take all medications exactly as prescribed and will call provider for medication related questions demonstrate Improved health management independence   continue to work with RN Care Manager to address care management and care coordination needs related to  DM, CKD Stage 4, Hypertensive Nephropathy, Diabetic mononeuropathy associated with type 2 Diabetes, Vitamin D deficiency, HLD will demonstrate ongoing self health care management ability    through  collaboration with Consulting civil engineer, provider, and care team.   Interventions: 1:1 collaboration with primary care provider regarding development and update of comprehensive plan of care as evidenced by provider attestation and co-signature Inter-disciplinary care team collaboration (see longitudinal plan of care) Evaluation of current treatment plan related to  self management and patient's adherence to plan as established by provider  Hyperlipidemia Interventions: (Status: Goal on track: Yes) Medication review performed; medication list updated in electronic medical record.  Review of  patient status, including review of consultant's reports, relevant laboratory and other test results, and medications completed. Provider established cholesterol goals reviewed Provided HLD educational materials Reviewed importance of limiting foods high in cholesterol Reviewed exercise goals and target of 150 minutes per week Assessed social determinant of health barriers  Reviewed and discussed PCP referral for in home PT, patient/wife aware of next steps Printed mailed educational materials related to "The Skinny on Fats"; "Cooking to lower Cholesterol"  Goal on track:  Yes Evaluation of current treatment plan related to  Vitamin D deficiency  , self-management and patient's adherence to plan as established by provider. Provided education to patient about basic disease process for Vitamin D deficiency; educated on role of Vitamin D and potential complications of deficiency Review of patient status, including review of consultant's reports, relevant laboratory and other test results, and medications completed. Provided education to patient re: importance to eat a Vitamin D rich diet, get at least 15 minutes of natural sunlight when possible; Reviewed medications with patient and discussed indication, frequency and dosage of prescribed Vitamin D supplement; Discussed plans with patient for ongoing care management follow up and provided patient with direct contact information for care management team  Diabetes Interventions:(Status: Goal on track: Yes) Assessed patient's understanding of A1c goal: <6.5% Provided education to patient about basic DM disease process Reviewed medications with patient and discussed importance of medication adherence Advised patient, providing education and rationale, to check cbg daily before meals and at bedtime and record, calling PCP and or RN CM for findings outside established parameters Review of patient status, including review of consultants reports,  relevant laboratory and other test results, and medications completed Determined patient is working with the embedded Pharm D to complete PAP for Ozempic   Lab Results  Component Value Date   HGBA1C 5.8 (H) 12/15/2020      Chronic Kidney Disease Interventions:  (Status:  New goal.) Long Term Goal Evaluation of current treatment plan related to chronic kidney disease self management and patient's adherence to plan as established by provider      Reviewed prescribed diet per kidney specialist/renal nutritionist  Advised patient to discuss fluid restriction with provider    Assessed social determinant of health barriers    Provided education on kidney disease progression    Engage patient in early, proactive and ongoing discussion about goals of care and what matters most to them    Mailed printed educational materials related to Eating Right with Chronic Kidney disease Last practice recorded BP readings:  BP Readings from Last 3 Encounters:  12/15/20 118/60  11/16/20 (!) 115/56  10/06/20 130/78  Most recent eGFR/CrCl:  Lab Results  Component Value Date   EGFR 17 (L) 12/15/2020    No components found for: CRCL   Patient Goals/Self-Care Activities: Take all medications as prescribed Attend all scheduled provider appointments Call pharmacy for medication refills 3-7 days in advance of running out of medications Perform all self care activities independently  Call provider office for new  concerns or questions  Work with embedded Pharm D for Ozempic PAP drink 6 to 8 glasses of water each day manage portion size  Follow Up Plan:  Telephone follow up appointment with care management team member scheduled for:  02/17/21     Plan:Telephone follow up appointment with care management team member scheduled for:  02/17/21  Barb Merino, RN, BSN, CCM Care Management Coordinator [facility]  Direct Phone: [phone]

## 2020-12-20 ENCOUNTER — Telehealth: Payer: Self-pay

## 2020-12-20 DIAGNOSIS — M545 Low back pain, unspecified: Secondary | ICD-10-CM | POA: Diagnosis not present

## 2020-12-20 DIAGNOSIS — K5900 Constipation, unspecified: Secondary | ICD-10-CM | POA: Diagnosis not present

## 2020-12-20 DIAGNOSIS — H8111 Benign paroxysmal vertigo, right ear: Secondary | ICD-10-CM | POA: Diagnosis not present

## 2020-12-20 DIAGNOSIS — E1141 Type 2 diabetes mellitus with diabetic mononeuropathy: Secondary | ICD-10-CM | POA: Diagnosis not present

## 2020-12-20 DIAGNOSIS — N184 Chronic kidney disease, stage 4 (severe): Secondary | ICD-10-CM | POA: Diagnosis not present

## 2020-12-20 DIAGNOSIS — I129 Hypertensive chronic kidney disease with stage 1 through stage 4 chronic kidney disease, or unspecified chronic kidney disease: Secondary | ICD-10-CM | POA: Diagnosis not present

## 2020-12-20 DIAGNOSIS — Z7985 Long-term (current) use of injectable non-insulin antidiabetic drugs: Secondary | ICD-10-CM | POA: Diagnosis not present

## 2020-12-20 DIAGNOSIS — E1122 Type 2 diabetes mellitus with diabetic chronic kidney disease: Secondary | ICD-10-CM | POA: Diagnosis not present

## 2020-12-20 DIAGNOSIS — Z87891 Personal history of nicotine dependence: Secondary | ICD-10-CM | POA: Diagnosis not present

## 2020-12-20 DIAGNOSIS — H409 Unspecified glaucoma: Secondary | ICD-10-CM | POA: Diagnosis not present

## 2020-12-20 DIAGNOSIS — G894 Chronic pain syndrome: Secondary | ICD-10-CM | POA: Diagnosis not present

## 2020-12-20 DIAGNOSIS — Z8673 Personal history of transient ischemic attack (TIA), and cerebral infarction without residual deficits: Secondary | ICD-10-CM | POA: Diagnosis not present

## 2020-12-20 DIAGNOSIS — N2581 Secondary hyperparathyroidism of renal origin: Secondary | ICD-10-CM | POA: Diagnosis not present

## 2020-12-20 DIAGNOSIS — E785 Hyperlipidemia, unspecified: Secondary | ICD-10-CM | POA: Diagnosis not present

## 2020-12-20 NOTE — Chronic Care Management (AMB) (Signed)
Chronic Care Management Pharmacy Assistant   Name: Justin Holloway  MRN: [id number] DOB: 16-May-1929  Reason for Encounter: Disease State/ Diabetes  Recent office visits:  11-17-2020 Lynne Logan, RN (CCM).  12-15-2020 [name], NP. Referral placed for physical therapy. RBC= 3.82, Hemo= 11.2, Hema= 34.0. Glucose= 109, BUN= 51, Creatinine= 3.27, eGFR= 17. A1C= 5.8. LDL= 104. Abnormal UA.  Recent consult visits:  11-16-2020 Pieter Partridge, DO (Neurology).  "To help with nerve pain, start duloxetine 36m daily.  If no improvement in 6 weeks, contact me and we can increase dose. Follow up in 6 months".  Hospital visits:  None in previous 6 months  Medications: Outpatient Encounter Medications as of 12/20/2020  Medication Sig   Ascorbic Acid (VITAMIN C) 1000 MG tablet Take 1,000 mg by mouth daily.    aspirin EC 81 MG tablet Take 81 mg by mouth every evening.   Blood Glucose Monitoring Suppl (ONE TOUCH ULTRA 2) w/Device KIT Use as directed to check blood sugars 2 times per day dx: e11.22   cholecalciferol (VITAMIN D) 1000 units tablet Take 2,000 Units by mouth daily.    COMBIGAN 0.2-0.5 % ophthalmic solution Place 1 drop into both eyes every 12 (twelve) hours.   diclofenac Sodium (VOLTAREN) 1 % GEL Apply 4 g topically 4 (four) times daily. (Patient taking differently: Apply 2-4 g topically in the morning and at bedtime. shoulder)   DULoxetine (CYMBALTA) 30 MG capsule Take 1 capsule (30 mg total) by mouth daily.   furosemide (LASIX) 80 MG tablet TAKE 1 TABLET BY MOUTH IN  THE MORNING AND ONE-HALF  TABLET BY MOUTH IN THE  EARLY EVENING (Patient taking differently: Take 40-80 mg by mouth See admin instructions. 80 mg in the morning and 40 mg in the evening.)   glucose blood (ONETOUCH ULTRA) test strip USE STRIPS TO TEST BLOOD  SUGAR 3 TIMES DAILY 1 STRIP PER TEST   hydrALAZINE (APRESOLINE) 10 MG tablet Take 10 mg by mouth 3 (three) times daily.   insulin degludec (TRESIBA) 200  UNIT/ML FlexTouch Pen Inject 24 Units into the skin at bedtime.   Iron-FA-B Cmp-C-Biot-Probiotic (FUSION PLUS) CAPS Take by mouth. 1 capsule per day (Patient not taking: Reported on 11/16/2020)   Lancets (ONETOUCH DELICA PLUS [id number] MISC 100 each by Does not apply route in the morning, at noon, and at bedtime.   LUMIGAN 0.01 % SOLN Place 1 drop into both eyes 2 (two) times daily.    Multiple Vitamin (MULTIVITAMIN WITH MINERALS) TABS tablet Take 1 tablet by mouth daily.   polyethylene glycol powder (GLYCOLAX/MIRALAX) 17 GM/SCOOP powder Take 17 g by mouth daily as needed for mild constipation.   pravastatin (PRAVACHOL) 40 MG tablet Take 1 tablet (40 mg total) by mouth daily. (Patient taking differently: Take 40 mg by mouth at bedtime.)   pregabalin (LYRICA) 25 MG capsule Take 1 capsule (25 mg total) by mouth at bedtime. (Patient not taking: Reported on 11/16/2020)   Semaglutide,0.25 or 0.5MG/DOS, (OZEMPIC, 0.25 OR 0.5 MG/DOSE,) 2 MG/1.5ML SOPN Inject 0.5 mg into the skin once a week.   tamsulosin (FLOMAX) 0.4 MG CAPS capsule Take 0.4 mg by mouth daily.   No facility-administered encounter medications on file as of 12/20/2020.   Recent Relevant Labs: Lab Results  Component Value Date/Time   HGBA1C 5.8 (H) 12/15/2020 03:12 PM   HGBA1C 6.7 (H) 10/06/2020 04:02 PM   MICROALBUR 30 12/15/2020 04:52 PM   MICROALBUR 10 06/10/2020 12:38 PM    Kidney  Function Lab Results  Component Value Date/Time   CREATININE 3.27 (H) 12/15/2020 03:12 PM   CREATININE 3.06 (H) 06/09/2020 05:22 PM   GFRNONAA 21 (L) 05/15/2020 02:01 AM   GFRAA 19 (L) 01/14/2020 04:59 PM    Current antihyperglycemic regimen:  Tresiba- 24 units at bedtime Ozempic 0.5 mg weekly  What recent interventions/DTPs have been made to improve glycemic control:  Educated on A1c and blood sugar goals; Prevention and management of hypoglycemic episodes; -Counseled to check feet daily and get yearly eye exams -Counseled on diet and  exercise extensively  Have there been any recent hospitalizations or ED visits since last visit with CPP? No  Patient denies hypoglycemic symptoms  Patient denies hyperglycemic symptoms  How often are you checking your blood sugar? once daily  What are your blood sugars ranging?  Fasting: 95, 110, 120 Before meals: None After meals: None Bedtime: None  During the week, how often does your blood glucose drop below 70? Never  Are you checking your feet daily/regularly? Daily  Adherence Review: Is the patient currently on a STATIN medication? Yes Is the patient currently on ACE/ARB medication? No Does the patient have >5 day gap between last estimated fill dates? No  Care Gaps: AWV 09-15-2021  Star Rating Drugs: Pravastatin 40 mg- Last filled 11-24-2020 90 DS [facility] Ozempic 0.5 mg- Patient assistance  [place] Pharmacist Assistant [phone]

## 2020-12-29 DIAGNOSIS — N2581 Secondary hyperparathyroidism of renal origin: Secondary | ICD-10-CM | POA: Diagnosis not present

## 2020-12-29 DIAGNOSIS — K5900 Constipation, unspecified: Secondary | ICD-10-CM | POA: Diagnosis not present

## 2020-12-29 DIAGNOSIS — E1122 Type 2 diabetes mellitus with diabetic chronic kidney disease: Secondary | ICD-10-CM | POA: Diagnosis not present

## 2020-12-29 DIAGNOSIS — Z7985 Long-term (current) use of injectable non-insulin antidiabetic drugs: Secondary | ICD-10-CM | POA: Diagnosis not present

## 2020-12-29 DIAGNOSIS — I129 Hypertensive chronic kidney disease with stage 1 through stage 4 chronic kidney disease, or unspecified chronic kidney disease: Secondary | ICD-10-CM | POA: Diagnosis not present

## 2020-12-29 DIAGNOSIS — E1141 Type 2 diabetes mellitus with diabetic mononeuropathy: Secondary | ICD-10-CM | POA: Diagnosis not present

## 2020-12-29 DIAGNOSIS — Z87891 Personal history of nicotine dependence: Secondary | ICD-10-CM | POA: Diagnosis not present

## 2020-12-29 DIAGNOSIS — H409 Unspecified glaucoma: Secondary | ICD-10-CM | POA: Diagnosis not present

## 2020-12-29 DIAGNOSIS — Z8673 Personal history of transient ischemic attack (TIA), and cerebral infarction without residual deficits: Secondary | ICD-10-CM | POA: Diagnosis not present

## 2020-12-29 DIAGNOSIS — N184 Chronic kidney disease, stage 4 (severe): Secondary | ICD-10-CM | POA: Diagnosis not present

## 2020-12-29 DIAGNOSIS — H8111 Benign paroxysmal vertigo, right ear: Secondary | ICD-10-CM | POA: Diagnosis not present

## 2020-12-29 DIAGNOSIS — E785 Hyperlipidemia, unspecified: Secondary | ICD-10-CM | POA: Diagnosis not present

## 2020-12-29 DIAGNOSIS — M545 Low back pain, unspecified: Secondary | ICD-10-CM | POA: Diagnosis not present

## 2020-12-29 DIAGNOSIS — G894 Chronic pain syndrome: Secondary | ICD-10-CM | POA: Diagnosis not present

## 2021-01-03 DIAGNOSIS — I639 Cerebral infarction, unspecified: Secondary | ICD-10-CM | POA: Diagnosis not present

## 2021-01-03 DIAGNOSIS — S42201D Unspecified fracture of upper end of right humerus, subsequent encounter for fracture with routine healing: Secondary | ICD-10-CM | POA: Diagnosis not present

## 2021-01-03 DIAGNOSIS — E1122 Type 2 diabetes mellitus with diabetic chronic kidney disease: Secondary | ICD-10-CM | POA: Diagnosis not present

## 2021-01-03 DIAGNOSIS — G8929 Other chronic pain: Secondary | ICD-10-CM | POA: Diagnosis not present

## 2021-01-04 DIAGNOSIS — Z8673 Personal history of transient ischemic attack (TIA), and cerebral infarction without residual deficits: Secondary | ICD-10-CM | POA: Diagnosis not present

## 2021-01-04 DIAGNOSIS — E1141 Type 2 diabetes mellitus with diabetic mononeuropathy: Secondary | ICD-10-CM | POA: Diagnosis not present

## 2021-01-04 DIAGNOSIS — E1122 Type 2 diabetes mellitus with diabetic chronic kidney disease: Secondary | ICD-10-CM | POA: Diagnosis not present

## 2021-01-04 DIAGNOSIS — H409 Unspecified glaucoma: Secondary | ICD-10-CM | POA: Diagnosis not present

## 2021-01-04 DIAGNOSIS — M545 Low back pain, unspecified: Secondary | ICD-10-CM | POA: Diagnosis not present

## 2021-01-04 DIAGNOSIS — N184 Chronic kidney disease, stage 4 (severe): Secondary | ICD-10-CM | POA: Diagnosis not present

## 2021-01-04 DIAGNOSIS — N2581 Secondary hyperparathyroidism of renal origin: Secondary | ICD-10-CM | POA: Diagnosis not present

## 2021-01-04 DIAGNOSIS — H8111 Benign paroxysmal vertigo, right ear: Secondary | ICD-10-CM | POA: Diagnosis not present

## 2021-01-04 DIAGNOSIS — Z87891 Personal history of nicotine dependence: Secondary | ICD-10-CM | POA: Diagnosis not present

## 2021-01-04 DIAGNOSIS — I129 Hypertensive chronic kidney disease with stage 1 through stage 4 chronic kidney disease, or unspecified chronic kidney disease: Secondary | ICD-10-CM | POA: Diagnosis not present

## 2021-01-04 DIAGNOSIS — K5900 Constipation, unspecified: Secondary | ICD-10-CM | POA: Diagnosis not present

## 2021-01-04 DIAGNOSIS — G894 Chronic pain syndrome: Secondary | ICD-10-CM | POA: Diagnosis not present

## 2021-01-04 DIAGNOSIS — Z7985 Long-term (current) use of injectable non-insulin antidiabetic drugs: Secondary | ICD-10-CM | POA: Diagnosis not present

## 2021-01-04 DIAGNOSIS — E785 Hyperlipidemia, unspecified: Secondary | ICD-10-CM | POA: Diagnosis not present

## 2021-01-11 DIAGNOSIS — N2581 Secondary hyperparathyroidism of renal origin: Secondary | ICD-10-CM | POA: Diagnosis not present

## 2021-01-11 DIAGNOSIS — K5900 Constipation, unspecified: Secondary | ICD-10-CM | POA: Diagnosis not present

## 2021-01-11 DIAGNOSIS — N184 Chronic kidney disease, stage 4 (severe): Secondary | ICD-10-CM | POA: Diagnosis not present

## 2021-01-11 DIAGNOSIS — G894 Chronic pain syndrome: Secondary | ICD-10-CM | POA: Diagnosis not present

## 2021-01-11 DIAGNOSIS — Z87891 Personal history of nicotine dependence: Secondary | ICD-10-CM | POA: Diagnosis not present

## 2021-01-11 DIAGNOSIS — I129 Hypertensive chronic kidney disease with stage 1 through stage 4 chronic kidney disease, or unspecified chronic kidney disease: Secondary | ICD-10-CM | POA: Diagnosis not present

## 2021-01-11 DIAGNOSIS — H8111 Benign paroxysmal vertigo, right ear: Secondary | ICD-10-CM | POA: Diagnosis not present

## 2021-01-11 DIAGNOSIS — E785 Hyperlipidemia, unspecified: Secondary | ICD-10-CM | POA: Diagnosis not present

## 2021-01-11 DIAGNOSIS — H409 Unspecified glaucoma: Secondary | ICD-10-CM | POA: Diagnosis not present

## 2021-01-11 DIAGNOSIS — Z7985 Long-term (current) use of injectable non-insulin antidiabetic drugs: Secondary | ICD-10-CM | POA: Diagnosis not present

## 2021-01-11 DIAGNOSIS — E1141 Type 2 diabetes mellitus with diabetic mononeuropathy: Secondary | ICD-10-CM | POA: Diagnosis not present

## 2021-01-11 DIAGNOSIS — E1122 Type 2 diabetes mellitus with diabetic chronic kidney disease: Secondary | ICD-10-CM | POA: Diagnosis not present

## 2021-01-11 DIAGNOSIS — M545 Low back pain, unspecified: Secondary | ICD-10-CM | POA: Diagnosis not present

## 2021-01-11 DIAGNOSIS — Z8673 Personal history of transient ischemic attack (TIA), and cerebral infarction without residual deficits: Secondary | ICD-10-CM | POA: Diagnosis not present

## 2021-01-26 DIAGNOSIS — N184 Chronic kidney disease, stage 4 (severe): Secondary | ICD-10-CM | POA: Diagnosis not present

## 2021-02-01 DIAGNOSIS — E1141 Type 2 diabetes mellitus with diabetic mononeuropathy: Secondary | ICD-10-CM | POA: Diagnosis not present

## 2021-02-01 DIAGNOSIS — Z87891 Personal history of nicotine dependence: Secondary | ICD-10-CM | POA: Diagnosis not present

## 2021-02-01 DIAGNOSIS — K5900 Constipation, unspecified: Secondary | ICD-10-CM | POA: Diagnosis not present

## 2021-02-01 DIAGNOSIS — H8111 Benign paroxysmal vertigo, right ear: Secondary | ICD-10-CM | POA: Diagnosis not present

## 2021-02-01 DIAGNOSIS — E1122 Type 2 diabetes mellitus with diabetic chronic kidney disease: Secondary | ICD-10-CM | POA: Diagnosis not present

## 2021-02-01 DIAGNOSIS — H409 Unspecified glaucoma: Secondary | ICD-10-CM | POA: Diagnosis not present

## 2021-02-01 DIAGNOSIS — M545 Low back pain, unspecified: Secondary | ICD-10-CM | POA: Diagnosis not present

## 2021-02-01 DIAGNOSIS — I129 Hypertensive chronic kidney disease with stage 1 through stage 4 chronic kidney disease, or unspecified chronic kidney disease: Secondary | ICD-10-CM | POA: Diagnosis not present

## 2021-02-01 DIAGNOSIS — G894 Chronic pain syndrome: Secondary | ICD-10-CM | POA: Diagnosis not present

## 2021-02-01 DIAGNOSIS — N2581 Secondary hyperparathyroidism of renal origin: Secondary | ICD-10-CM | POA: Diagnosis not present

## 2021-02-01 DIAGNOSIS — N184 Chronic kidney disease, stage 4 (severe): Secondary | ICD-10-CM | POA: Diagnosis not present

## 2021-02-01 DIAGNOSIS — E785 Hyperlipidemia, unspecified: Secondary | ICD-10-CM | POA: Diagnosis not present

## 2021-02-01 DIAGNOSIS — Z8673 Personal history of transient ischemic attack (TIA), and cerebral infarction without residual deficits: Secondary | ICD-10-CM | POA: Diagnosis not present

## 2021-02-01 DIAGNOSIS — Z7985 Long-term (current) use of injectable non-insulin antidiabetic drugs: Secondary | ICD-10-CM | POA: Diagnosis not present

## 2021-02-02 ENCOUNTER — Ambulatory Visit: Payer: Medicare Other | Admitting: Podiatry

## 2021-02-03 DIAGNOSIS — E1122 Type 2 diabetes mellitus with diabetic chronic kidney disease: Secondary | ICD-10-CM | POA: Diagnosis not present

## 2021-02-03 DIAGNOSIS — I639 Cerebral infarction, unspecified: Secondary | ICD-10-CM | POA: Diagnosis not present

## 2021-02-03 DIAGNOSIS — G8929 Other chronic pain: Secondary | ICD-10-CM | POA: Diagnosis not present

## 2021-02-03 DIAGNOSIS — S42201D Unspecified fracture of upper end of right humerus, subsequent encounter for fracture with routine healing: Secondary | ICD-10-CM | POA: Diagnosis not present

## 2021-02-07 ENCOUNTER — Ambulatory Visit: Payer: Medicare Other | Admitting: Podiatry

## 2021-02-07 ENCOUNTER — Encounter: Payer: Self-pay | Admitting: Podiatry

## 2021-02-07 ENCOUNTER — Other Ambulatory Visit: Payer: Self-pay

## 2021-02-07 DIAGNOSIS — I739 Peripheral vascular disease, unspecified: Secondary | ICD-10-CM

## 2021-02-07 DIAGNOSIS — N179 Acute kidney failure, unspecified: Secondary | ICD-10-CM

## 2021-02-07 DIAGNOSIS — N184 Chronic kidney disease, stage 4 (severe): Secondary | ICD-10-CM | POA: Diagnosis not present

## 2021-02-07 DIAGNOSIS — M79675 Pain in left toe(s): Secondary | ICD-10-CM

## 2021-02-07 DIAGNOSIS — M79674 Pain in right toe(s): Secondary | ICD-10-CM

## 2021-02-07 DIAGNOSIS — E1142 Type 2 diabetes mellitus with diabetic polyneuropathy: Secondary | ICD-10-CM | POA: Diagnosis not present

## 2021-02-07 DIAGNOSIS — L84 Corns and callosities: Secondary | ICD-10-CM

## 2021-02-07 DIAGNOSIS — N183 Chronic kidney disease, stage 3 unspecified: Secondary | ICD-10-CM | POA: Diagnosis not present

## 2021-02-07 DIAGNOSIS — B351 Tinea unguium: Secondary | ICD-10-CM

## 2021-02-07 NOTE — Progress Notes (Signed)
This patient returns to my office for at risk foot care.  This patient requires this care by a professional since this patient will be at risk due to having acute renal failure and diabetic neuropathy. This patient is unable to cut nails himself since the patient cannot reach his nails.These nails are painful walking and wearing shoes. Patient also states there is a painful callus right foot when he walks. This patient presents for at risk foot care today.  General Appearance  Alert, conversant and in no acute stress.  Vascular  Dorsalis pedis and posterior tibial  pulses are weakly  palpable  bilaterally.  Capillary return is within normal limits  bilaterally. Cold feet  bilaterally.  Neurologic  Senn-Weinstein monofilament wire test within normal limits  bilaterally. Muscle power within normal limits bilaterally.  Nails Thick disfigured discolored nails with subungual debris  from hallux to fifth toes bilaterally. No evidence of bacterial infection or drainage bilaterally.  Orthopedic  No limitations of motion  feet .  No crepitus or effusions noted.  No bony pathology or digital deformities noted.  Skin  normotropic skin with no porokeratosis noted bilaterally.  No signs of infections or ulcers noted.    Onychomycosis  Pain in right toes  Pain in left toes    Consent was obtained for treatment procedures.   Mechanical debridement of nails 1-5  bilaterally performed with a nail nipper.  Filed with dremel without incident. Debrided callus with # 15 blade and dremel tool.  Patient is interested in fungus medication.  Told him to pick it up at front desk.   Return office visit   3 months                   Told patient to return for periodic foot care and evaluation due to potential at risk complications.   Gardiner Barefoot DPM

## 2021-02-14 DIAGNOSIS — K5900 Constipation, unspecified: Secondary | ICD-10-CM | POA: Diagnosis not present

## 2021-02-14 DIAGNOSIS — Z87891 Personal history of nicotine dependence: Secondary | ICD-10-CM | POA: Diagnosis not present

## 2021-02-14 DIAGNOSIS — H8111 Benign paroxysmal vertigo, right ear: Secondary | ICD-10-CM | POA: Diagnosis not present

## 2021-02-14 DIAGNOSIS — N184 Chronic kidney disease, stage 4 (severe): Secondary | ICD-10-CM | POA: Diagnosis not present

## 2021-02-14 DIAGNOSIS — G894 Chronic pain syndrome: Secondary | ICD-10-CM | POA: Diagnosis not present

## 2021-02-14 DIAGNOSIS — Z7985 Long-term (current) use of injectable non-insulin antidiabetic drugs: Secondary | ICD-10-CM | POA: Diagnosis not present

## 2021-02-14 DIAGNOSIS — E1122 Type 2 diabetes mellitus with diabetic chronic kidney disease: Secondary | ICD-10-CM | POA: Diagnosis not present

## 2021-02-14 DIAGNOSIS — M545 Low back pain, unspecified: Secondary | ICD-10-CM | POA: Diagnosis not present

## 2021-02-14 DIAGNOSIS — E1141 Type 2 diabetes mellitus with diabetic mononeuropathy: Secondary | ICD-10-CM | POA: Diagnosis not present

## 2021-02-14 DIAGNOSIS — H409 Unspecified glaucoma: Secondary | ICD-10-CM | POA: Diagnosis not present

## 2021-02-14 DIAGNOSIS — N2581 Secondary hyperparathyroidism of renal origin: Secondary | ICD-10-CM | POA: Diagnosis not present

## 2021-02-14 DIAGNOSIS — E785 Hyperlipidemia, unspecified: Secondary | ICD-10-CM | POA: Diagnosis not present

## 2021-02-14 DIAGNOSIS — I129 Hypertensive chronic kidney disease with stage 1 through stage 4 chronic kidney disease, or unspecified chronic kidney disease: Secondary | ICD-10-CM | POA: Diagnosis not present

## 2021-02-14 DIAGNOSIS — Z8673 Personal history of transient ischemic attack (TIA), and cerebral infarction without residual deficits: Secondary | ICD-10-CM | POA: Diagnosis not present

## 2021-02-17 ENCOUNTER — Ambulatory Visit (INDEPENDENT_AMBULATORY_CARE_PROVIDER_SITE_OTHER): Payer: Medicare Other

## 2021-02-17 ENCOUNTER — Telehealth: Payer: Medicare Other

## 2021-02-17 DIAGNOSIS — E785 Hyperlipidemia, unspecified: Secondary | ICD-10-CM

## 2021-02-17 DIAGNOSIS — E559 Vitamin D deficiency, unspecified: Secondary | ICD-10-CM

## 2021-02-17 DIAGNOSIS — Z794 Long term (current) use of insulin: Secondary | ICD-10-CM

## 2021-02-17 DIAGNOSIS — E0841 Diabetes mellitus due to underlying condition with diabetic mononeuropathy: Secondary | ICD-10-CM

## 2021-02-17 DIAGNOSIS — N184 Chronic kidney disease, stage 4 (severe): Secondary | ICD-10-CM

## 2021-02-17 DIAGNOSIS — E1122 Type 2 diabetes mellitus with diabetic chronic kidney disease: Secondary | ICD-10-CM

## 2021-02-17 DIAGNOSIS — I129 Hypertensive chronic kidney disease with stage 1 through stage 4 chronic kidney disease, or unspecified chronic kidney disease: Secondary | ICD-10-CM

## 2021-02-18 NOTE — Patient Instructions (Signed)
Visit Information  Thank you for taking time to visit with me today. Please don't hesitate to contact me if I can be of assistance to you before our next scheduled telephone appointment.  Following are the goals we discussed today:  (Copy and paste patient goals from clinical care plan here)  Our next appointment is by telephone on 03/17/21 at 12 noon  Please call the care guide team at [phone] if you need to cancel or reschedule your appointment.   If you are experiencing a Mental Health or [place] or need someone to talk to, please call [phone]-TALK (toll free, 24 hour hotline)   Patient verbalizes understanding of instructions and care plan provided today and agrees to view in [place]. Active MyChart status confirmed with patient.    Barb Merino, RN, BSN, CCM Care Management Coordinator [place] [facility]  Direct Phone: [phone]

## 2021-02-18 NOTE — Chronic Care Management (AMB) (Signed)
Chronic Care Management   CCM RN Visit Note  02/17/2021 Name: Justin Holloway MRN: [id number] DOB: 30-Mar-1929  Subjective: Justin Holloway is a 88 y.o. year old male who is a primary care patient of [name], MD. The care management team was consulted for assistance with disease management and care coordination needs.    Engaged with patient by telephone for follow up visit in response to provider referral for case management and/or care coordination services.   Consent to Services:  The patient was given information about Chronic Care Management services, agreed to services, and gave verbal consent prior to initiation of services.  Please see initial visit note for detailed documentation.   Patient agreed to services and verbal consent obtained.   Assessment: Review of patient past medical history, allergies, medications, health status, including review of consultants reports, laboratory and other test data, was performed as part of comprehensive evaluation and provision of chronic care management services.   SDOH (Social Determinants of Health) assessments and interventions performed:  Yes, no acute changes  CCM Care Plan  Allergies  Allergen Reactions   Gabapentin Itching    Outpatient Encounter Medications as of 02/17/2021  Medication Sig   Ascorbic Acid (VITAMIN C) 1000 MG tablet Take 1,000 mg by mouth daily.    aspirin EC 81 MG tablet Take 81 mg by mouth every evening.   Blood Glucose Monitoring Suppl (ONE TOUCH ULTRA 2) w/Device KIT Use as directed to check blood sugars 2 times per day dx: e11.22   cholecalciferol (VITAMIN D) 1000 units tablet Take 2,000 Units by mouth daily.    COMBIGAN 0.2-0.5 % ophthalmic solution Place 1 drop into both eyes every 12 (twelve) hours.   diclofenac Sodium (VOLTAREN) 1 % GEL Apply 4 g topically 4 (four) times daily. (Patient taking differently: Apply 2-4 g topically in the morning and at bedtime. shoulder)   DULoxetine (CYMBALTA) 30 MG  capsule Take 1 capsule (30 mg total) by mouth daily.   furosemide (LASIX) 80 MG tablet TAKE 1 TABLET BY MOUTH IN  THE MORNING AND ONE-HALF  TABLET BY MOUTH IN THE  EARLY EVENING (Patient taking differently: Take 40-80 mg by mouth See admin instructions. 80 mg in the morning and 40 mg in the evening.)   glucose blood (ONETOUCH ULTRA) test strip USE STRIPS TO TEST BLOOD  SUGAR 3 TIMES DAILY 1 STRIP PER TEST   hydrALAZINE (APRESOLINE) 10 MG tablet Take 10 mg by mouth 3 (three) times daily.   insulin degludec (TRESIBA) 200 UNIT/ML FlexTouch Pen Inject 24 Units into the skin at bedtime.   Iron-FA-B Cmp-C-Biot-Probiotic (FUSION PLUS) CAPS Take by mouth. 1 capsule per day (Patient not taking: Reported on 11/16/2020)   Lancets (ONETOUCH DELICA PLUS [id number]) MISC 100 each by Does not apply route in the morning, at noon, and at bedtime.   LUMIGAN 0.01 % SOLN Place 1 drop into both eyes 2 (two) times daily.    Multiple Vitamin (MULTIVITAMIN WITH MINERALS) TABS tablet Take 1 tablet by mouth daily.   polyethylene glycol powder (GLYCOLAX/MIRALAX) 17 GM/SCOOP powder Take 17 g by mouth daily as needed for mild constipation.   pravastatin (PRAVACHOL) 40 MG tablet Take 1 tablet (40 mg total) by mouth daily. (Patient taking differently: Take 40 mg by mouth at bedtime.)   pregabalin (LYRICA) 25 MG capsule Take 1 capsule (25 mg total) by mouth at bedtime. (Patient not taking: Reported on 11/16/2020)   Semaglutide,0.25 or 0.5MG/DOS, (OZEMPIC, 0.25 OR 0.5 MG/DOSE,) 2 MG/1.5ML SOPN  Inject 0.5 mg into the skin once a week.   tamsulosin (FLOMAX) 0.4 MG CAPS capsule Take 0.4 mg by mouth daily.   No facility-administered encounter medications on file as of 02/17/2021.    Patient Active Problem List   Diagnosis Date Noted   Atherosclerosis of aorta ([place]) 08/22/2020   Shuffling gait 08/22/2020   Generalized weakness 05/10/2020   Failure to thrive in adult 05/10/2020   Chronic pain 05/10/2020   Constipation 05/10/2020    Class 1 drug-induced obesity with body mass index (BMI) of 34.0 to 34.9 in adult 05/10/2020   DNR (do not resuscitate) 05/10/2020   Hypertension    Hyperlipidemia    Glaucoma    CKD (chronic kidney disease), stage IV ([place])    Secondary renal hyperparathyroidism ([place]) 01/14/2020   Type 2 diabetes mellitus with stage 4 chronic kidney disease, with long-term current use of insulin ([place]) 09/10/2019   Peripheral vascular disease, unspecified ([place]) 05/28/2019   Diabetic neuropathy ([place]) 02/18/2019   Left upper quadrant pain 09/04/2018   Acute midline low back pain without sciatica 09/04/2018   CKD (chronic kidney disease) stage 4, GFR 15-29 ml/min (HCC) 11/21/2017   Hypertensive nephropathy 11/21/2017   Right foot pain 11/21/2017   Vertigo 09/09/2017   BPPV (benign paroxysmal positional vertigo), right 09/06/2017   Sensorineural hearing loss (SNHL), bilateral 09/06/2017   Ataxia 02/06/2017   UTI (lower urinary tract infection) 08/07/2015   Dizziness 08/07/2015   Acute renal failure superimposed on stage 3 chronic kidney disease ([place]) 08/07/2015   Hyperkalemia 08/07/2015   Left inguinal hernia 05/15/2013   Diabetes mellitus without complication ([place]) 68/11/5724   HLD (hyperlipidemia) 03/08/2006   HYPERTENSION, BENIGN SYSTEMIC 03/08/2006   IMPOTENCE, 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   PROTEINURIA 03/08/2006    Conditions to be addressed/monitored: DM, CKD Stage 4, Hypertensive Nephropathy, Diabetic mononeuropathy associated with type 2 Diabetes, Vitamin D deficiency, HLD  Care Plan : RN Care Manager Plan of Care  Updates made by Lynne Logan, RN since 02/17/2021 12:00 AM     Problem: No plan of care established for management of chronic disease states (DM, CKD Stage 4, Hypertensive Nephropathy, Diabetic mononeuropathy associated with type 2 Diabetes, Vitamin D deficiency, HLD)   Priority: High     Long-Range Goal: Establish plan of care for management of chronic disease states (DM, CKD Stage 4,  Hypertensive Nephropathy, Diabetic mononeuropathy associated with type 2 Diabetes, Vitamin D deficiency, HLD)   Start Date: 11/17/2020  Expected End Date: 11/17/2021  Recent Progress: On track  Priority: High  Note:   Current Barriers:  Knowledge Deficits related to plan of care for management of DM, CKD Stage 4, Hypertensive Nephropathy, Diabetic mononeuropathy associated with type 2 Diabetes, Vitamin D deficiency, HLD Chronic Disease Management support and education needs related to DM, CKD Stage 4, Hypertensive Nephropathy, Diabetic mononeuropathy associated with type 2 Diabetes, Vitamin D deficiency, HLD  RNCM Clinical Goal(s):  Patient will verbalize basic understanding of  DM, CKD Stage 4, Hypertensive Nephropathy, Diabetic mononeuropathy associated with type 2 Diabetes, Vitamin D deficiency, HLD disease process and self health management plan   take all medications exactly as prescribed and will call provider for medication related questions demonstrate Improved health management independence   continue to work with RN Care Manager to address care management and care coordination needs related to  DM, CKD Stage 4, Hypertensive Nephropathy, Diabetic mononeuropathy associated with type 2 Diabetes, Vitamin D deficiency, HLD will demonstrate ongoing self health care management ability    through  collaboration with Consulting civil engineer, provider, and care team.   Interventions: 1:1 collaboration with primary care provider regarding development and update of comprehensive plan of care as evidenced by provider attestation and co-signature Inter-disciplinary care team collaboration (see longitudinal plan of care) Evaluation of current treatment plan related to  self management and patient's adherence to plan as established by provider  Hyperlipidemia Interventions: (Status: Goal on track: Yes) Medication review performed; medication list updated in electronic medical record.  Review of patient  status, including review of consultant's reports, relevant laboratory and other test results, and medications completed. Provider established cholesterol goals reviewed Provided HLD educational materials Reviewed importance of limiting foods high in cholesterol Reviewed exercise goals and target of 150 minutes per week Assessed social determinant of health barriers  Reviewed and discussed PCP referral for in home PT, patient/wife aware of next steps Printed mailed educational materials related to "The Skinny on Fats"; "Cooking to lower Cholesterol"  Vitamin D deficiency Interventions: Status: (Condition stable.  Not addressed this visit.) Long Term Goal  Evaluation of current treatment plan related to  Vitamin D deficiency  , self-management and patient's adherence to plan as established by provider. Provided education to patient about basic disease process for Vitamin D deficiency; educated on role of Vitamin D and potential complications of deficiency Review of patient status, including review of consultant's reports, relevant laboratory and other test results, and medications completed. Provided education to patient re: importance to eat a Vitamin D rich diet, get at least 15 minutes of natural sunlight when possible; Reviewed medications with patient and discussed indication, frequency and dosage of prescribed Vitamin D supplement; Discussed plans with patient for ongoing care management follow up and provided patient with direct contact information for care management team  Diabetes Interventions: Status: (Condition stable.  Not addressed this visit.) Long Term Goal  Assessed patient's understanding of A1c goal: <6.5% Provided education to patient about basic DM disease process Reviewed medications with patient and discussed importance of medication adherence Advised patient, providing education and rationale, to check cbg daily before meals and at bedtime and record, calling PCP and or RN  CM for findings outside established parameters Review of patient status, including review of consultants reports, relevant laboratory and other test results, and medications completed Determined patient is working with the embedded Pharm D to complete PAP for Ozempic   Lab Results  Component Value Date   HGBA1C 5.8 (H) 12/15/2020      Chronic Kidney Disease Interventions:  (Status:  Goal on track:  Yes.) Long Term Goal Evaluation of current treatment plan related to chronic kidney disease self management and patient's adherence to plan as established by provider      Determined patient is scheduled to follow up with his Nephrologist, Dr. Carolin Sicks, on 03/07/21 Review of patient status, including review of consultant's reports, relevant laboratory and other test results, and medications completed Re-educated patient on stages of chronic kidney disease including signs/symptoms that may occur and when to seek medical attention Instructed patient to discuss dietary and fluid restrictions with Nephrologist at next scheduled visit  Discussed plans with patient for ongoing care management follow up and provided patient with direct contact information for care management team Last practice recorded BP readings:  BP Readings from Last 3 Encounters:  12/15/20 118/60  11/16/20 (!) 115/56  10/06/20 130/78  Most recent eGFR/CrCl:  Lab Results  Component Value Date   EGFR 17 (L) 12/15/2020    No components found for: CRCL  Patient Goals/Self-Care Activities: Take all medications as prescribed Attend all scheduled provider appointments Call pharmacy for medication refills 3-7 days in advance of running out of medications Perform all self care activities independently  Call provider office for new concerns or questions  Work with embedded Pharm D for Ozempic PAP manage portion size Follow Nephrology recommendations concerning fluid/dietary restrictions   Follow Up Plan:  Telephone follow up  appointment with care management team member scheduled for:  03/17/21      Plan:Telephone follow up appointment with care management team member scheduled for:  03/17/21  Barb Merino, RN, BSN, CCM Care Management Coordinator [place] [facility]  Direct Phone: [phone]

## 2021-03-01 ENCOUNTER — Other Ambulatory Visit: Payer: Self-pay | Admitting: Internal Medicine

## 2021-03-02 ENCOUNTER — Telehealth: Payer: Self-pay

## 2021-03-02 NOTE — Progress Notes (Signed)
Chronic Care Management Pharmacy Assistant   Name: CORDERIUS SARACENI  MRN: [id number] DOB: [birth year]  Reason for Encounter: Disease State/ Diabetes  Recent office visits:  02-17-2021 Lynne Logan, RN (CCM)  Recent consult visits:  02-07-2021 Gardiner Barefoot, DPM (Podiatry). Mechanical debridement of nails 1-5  bilaterally performed with a nail nipper.  Filed with dremel without incident. Debrided callus with # 15 blade and dremel tool.   Hospital visits:  None in previous 6 months  Medications: Outpatient Encounter Medications as of 03/02/2021  Medication Sig   Ascorbic Acid (VITAMIN C) 1000 MG tablet Take 1,000 mg by mouth daily.    aspirin EC 81 MG tablet Take 81 mg by mouth every evening.   Blood Glucose Monitoring Suppl (ONE TOUCH ULTRA 2) w/Device KIT Use as directed to check blood sugars 2 times per day dx: e11.22   cholecalciferol (VITAMIN D) 1000 units tablet Take 2,000 Units by mouth daily.    COMBIGAN 0.2-0.5 % ophthalmic solution Place 1 drop into both eyes every 12 (twelve) hours.   diclofenac Sodium (VOLTAREN) 1 % GEL Apply 4 g topically 4 (four) times daily. (Patient taking differently: Apply 2-4 g topically in the morning and at bedtime. shoulder)   DULoxetine (CYMBALTA) 30 MG capsule Take 1 capsule (30 mg total) by mouth daily.   furosemide (LASIX) 80 MG tablet TAKE 1 TABLET BY MOUTH IN  THE MORNING AND ONE-HALF  TABLET BY MOUTH IN THE  EARLY EVENING (Patient taking differently: Take 40-80 mg by mouth See admin instructions. 80 mg in the morning and 40 mg in the evening.)   glucose blood (ONETOUCH ULTRA) test strip USE STRIPS TO TEST BLOOD  SUGAR 3 TIMES DAILY 1 STRIP PER TEST   hydrALAZINE (APRESOLINE) 10 MG tablet Take 10 mg by mouth 3 (three) times daily.   insulin degludec (TRESIBA) 200 UNIT/ML FlexTouch Pen Inject 24 Units into the skin at bedtime.   Iron-FA-B Cmp-C-Biot-Probiotic (FUSION PLUS) CAPS Take by mouth. 1 capsule per day (Patient not taking:  Reported on 11/16/2020)   Lancets (ONETOUCH DELICA PLUS [id number]) MISC 100 each by Does not apply route in the morning, at noon, and at bedtime.   LUMIGAN 0.01 % SOLN Place 1 drop into both eyes 2 (two) times daily.    Multiple Vitamin (MULTIVITAMIN WITH MINERALS) TABS tablet Take 1 tablet by mouth daily.   polyethylene glycol powder (GLYCOLAX/MIRALAX) 17 GM/SCOOP powder Take 17 g by mouth daily as needed for mild constipation.   pravastatin (PRAVACHOL) 40 MG tablet Take 1 tablet (40 mg total) by mouth at bedtime.   pregabalin (LYRICA) 25 MG capsule Take 1 capsule (25 mg total) by mouth at bedtime. (Patient not taking: Reported on 11/16/2020)   Semaglutide,0.25 or 0.5MG/DOS, (OZEMPIC, 0.25 OR 0.5 MG/DOSE,) 2 MG/1.5ML SOPN Inject 0.5 mg into the skin once a week.   tamsulosin (FLOMAX) 0.4 MG CAPS capsule Take 0.4 mg by mouth daily.   No facility-administered encounter medications on file as of 03/02/2021.  Recent Relevant Labs: Lab Results  Component Value Date/Time   HGBA1C 5.8 (H) 12/15/2020 03:12 PM   HGBA1C 6.7 (H) 10/06/2020 04:02 PM   MICROALBUR 30 12/15/2020 04:52 PM   MICROALBUR 10 06/10/2020 12:38 PM    Kidney Function Lab Results  Component Value Date/Time   CREATININE 3.27 (H) 12/15/2020 03:12 PM   CREATININE 3.06 (H) 06/09/2020 05:22 PM   GFRNONAA 21 (L) 05/15/2020 02:01 AM   GFRAA 19 (L) 01/14/2020 04:59 PM  Current antihyperglycemic regimen:  Tresiba- 24 units at bedtime Ozempic 0.5 mg weekly  What recent interventions/DTPs have been made to improve glycemic control:  Educated on A1c and blood sugar goals; Prevention and management of hypoglycemic episodes; -Counseled to check feet daily and get yearly eye exams -Counseled on diet and exercise extensively  Have there been any recent hospitalizations or ED visits since last visit with CPP? No  Patient denies hypoglycemic symptoms  Patient denies hyperglycemic symptoms  How often are you checking your blood  sugar? once daily  What are your blood sugars ranging?  Fasting: 86, 88, 97 Before meals: None After meals: None Bedtime: None  During the week, how often does your blood glucose drop below 70? Never  Are you checking your feet daily/regularly? Daily  Adherence Review: Is the patient currently on a STATIN medication? Yes Is the patient currently on ACE/ARB medication? No Does the patient have >5 day gap between last estimated fill dates? No  Care Gaps: Covid booster overdue AWV 09-15-2021  Star Rating Drugs: Pravastatin 40 mg- Last filled 03-01-2021 90 DS [facility] Ozempic 0.5 mg- Patient assistance  [place] Pharmacist Assistant [phone]

## 2021-03-06 DIAGNOSIS — G8929 Other chronic pain: Secondary | ICD-10-CM | POA: Diagnosis not present

## 2021-03-06 DIAGNOSIS — S42201D Unspecified fracture of upper end of right humerus, subsequent encounter for fracture with routine healing: Secondary | ICD-10-CM | POA: Diagnosis not present

## 2021-03-06 DIAGNOSIS — I639 Cerebral infarction, unspecified: Secondary | ICD-10-CM | POA: Diagnosis not present

## 2021-03-06 DIAGNOSIS — E1122 Type 2 diabetes mellitus with diabetic chronic kidney disease: Secondary | ICD-10-CM | POA: Diagnosis not present

## 2021-03-07 DIAGNOSIS — R609 Edema, unspecified: Secondary | ICD-10-CM | POA: Diagnosis not present

## 2021-03-07 DIAGNOSIS — I129 Hypertensive chronic kidney disease with stage 1 through stage 4 chronic kidney disease, or unspecified chronic kidney disease: Secondary | ICD-10-CM | POA: Diagnosis not present

## 2021-03-07 DIAGNOSIS — N2581 Secondary hyperparathyroidism of renal origin: Secondary | ICD-10-CM | POA: Diagnosis not present

## 2021-03-07 DIAGNOSIS — D631 Anemia in chronic kidney disease: Secondary | ICD-10-CM | POA: Diagnosis not present

## 2021-03-07 DIAGNOSIS — N184 Chronic kidney disease, stage 4 (severe): Secondary | ICD-10-CM | POA: Diagnosis not present

## 2021-03-08 DIAGNOSIS — E0841 Diabetes mellitus due to underlying condition with diabetic mononeuropathy: Secondary | ICD-10-CM | POA: Diagnosis not present

## 2021-03-08 DIAGNOSIS — N184 Chronic kidney disease, stage 4 (severe): Secondary | ICD-10-CM

## 2021-03-08 DIAGNOSIS — E785 Hyperlipidemia, unspecified: Secondary | ICD-10-CM

## 2021-03-08 DIAGNOSIS — Z794 Long term (current) use of insulin: Secondary | ICD-10-CM

## 2021-03-08 DIAGNOSIS — E1122 Type 2 diabetes mellitus with diabetic chronic kidney disease: Secondary | ICD-10-CM

## 2021-03-08 DIAGNOSIS — I129 Hypertensive chronic kidney disease with stage 1 through stage 4 chronic kidney disease, or unspecified chronic kidney disease: Secondary | ICD-10-CM

## 2021-03-12 ENCOUNTER — Other Ambulatory Visit: Payer: Self-pay

## 2021-03-12 ENCOUNTER — Ambulatory Visit (HOSPITAL_COMMUNITY)
Admission: EM | Admit: 2021-03-12 | Discharge: 2021-03-12 | Disposition: A | Discharge status: Home / Self Care | Payer: Medicare Other | Attending: Emergency Medicine | Admitting: Emergency Medicine

## 2021-03-12 ENCOUNTER — Ambulatory Visit (INDEPENDENT_AMBULATORY_CARE_PROVIDER_SITE_OTHER): Payer: Medicare Other

## 2021-03-12 ENCOUNTER — Encounter (HOSPITAL_COMMUNITY): Payer: Self-pay

## 2021-03-12 DIAGNOSIS — R8281 Pyuria: Secondary | ICD-10-CM | POA: Diagnosis not present

## 2021-03-12 DIAGNOSIS — N189 Chronic kidney disease, unspecified: Secondary | ICD-10-CM | POA: Insufficient documentation

## 2021-03-12 DIAGNOSIS — M5136 Other intervertebral disc degeneration, lumbar region: Secondary | ICD-10-CM

## 2021-03-12 DIAGNOSIS — E1169 Type 2 diabetes mellitus with other specified complication: Secondary | ICD-10-CM | POA: Diagnosis not present

## 2021-03-12 DIAGNOSIS — M545 Low back pain, unspecified: Secondary | ICD-10-CM | POA: Diagnosis not present

## 2021-03-12 DIAGNOSIS — E1122 Type 2 diabetes mellitus with diabetic chronic kidney disease: Secondary | ICD-10-CM

## 2021-03-12 DIAGNOSIS — E0841 Diabetes mellitus due to underlying condition with diabetic mononeuropathy: Secondary | ICD-10-CM

## 2021-03-12 DIAGNOSIS — M5442 Lumbago with sciatica, left side: Secondary | ICD-10-CM | POA: Insufficient documentation

## 2021-03-12 DIAGNOSIS — M5432 Sciatica, left side: Secondary | ICD-10-CM | POA: Diagnosis not present

## 2021-03-12 DIAGNOSIS — R300 Dysuria: Secondary | ICD-10-CM | POA: Insufficient documentation

## 2021-03-12 DIAGNOSIS — G8929 Other chronic pain: Secondary | ICD-10-CM | POA: Insufficient documentation

## 2021-03-12 DIAGNOSIS — M2578 Osteophyte, vertebrae: Secondary | ICD-10-CM | POA: Diagnosis not present

## 2021-03-12 LAB — POCT URINALYSIS DIPSTICK, ED / UC
Bilirubin Urine: NEGATIVE
Glucose, UA: NEGATIVE mg/dL
Hgb urine dipstick: NEGATIVE
Ketones, ur: NEGATIVE mg/dL
Nitrite: NEGATIVE
Protein, ur: NEGATIVE mg/dL
Specific Gravity, Urine: 1.015 (ref 1.005–1.030)
Urobilinogen, UA: 0.2 mg/dL (ref 0.0–1.0)
pH: 5 (ref 5.0–8.0)

## 2021-03-12 MED ORDER — TRAMADOL HCL 50 MG PO TABS
50.0000 mg | ORAL_TABLET | Freq: Two times a day (BID) | ORAL | 0 refills | Status: AC | PRN
Start: 2021-03-12 — End: 2021-03-17

## 2021-03-12 MED ORDER — PREGABALIN 25 MG PO CAPS: 25.0000 mg | ORAL_CAPSULE | Freq: Two times a day (BID) | ORAL | 0 refills | Status: DC

## 2021-03-12 MED ORDER — DICLOFENAC SODIUM 1 % EX GEL: 4.0000 g | Freq: Four times a day (QID) | CUTANEOUS | 5 refills | Status: DC

## 2021-03-12 NOTE — Discharge Instructions (Addendum)
The urinalysis that we performed in the clinic today was abnormal.  Urine culture will be performed per our protocol.  The urinalysis was not concerning for any signs of dehydration.  The x-ray we performed today did not demonstrate any concerns for bowel dysfunction.  Please follow-up with your primary care provider regarding your recent episodes of diarrhea.  It may be related to your antidiabetic medications. ? ?The result of the urine culture will be available in the next 3 to 5 days and will be posted to your MyChart account.  If there is an abnormal finding, you will be contacted by phone and advised of further treatment recommendations, if any. ?  ?If you have not had complete resolution of your urinary tract symptoms after completing treatment as prescribed, please return to urgent care for repeat evaluation or follow-up with your primary care provider. ?  ?The x-ray of your lumbar spine revealed moderate degenerative disc disease, bone spurs and some inflammation of the bones in your lower lumbar spine, lumbar spine vertebrae numbers 4 and 5 as well as in your first sacral spin vertebra.   ? ?Because you have significant kidney dysfunction, I am unable to provide you with any oral nonsteroidal anti-inflammatory pain medication such as ibuprofen or naproxen.  Because you are not diabetic, I am unable to prescribe you with any steroid all anti-inflammatory medications.   ? ?Therefore, because you have been on pregabalin and tramadol in the past, I recommend that you take these 2 medications for the short-term to relieve your more immediate pain.  I also took the liberty of renewing a prior prescription for Voltaren gel that you can apply 4 times daily as needed for pain relief as well. ? ?Please be sure you follow-up with your primary care provider regarding the findings of your x-ray today as soon as possible to discuss referral to orthopedics or spine specialty for further evaluation and treatment. ? ?Thank  you for visiting urgent care today.  I appreciate the opportunity to participate in your care. ? ?

## 2021-03-12 NOTE — ED Triage Notes (Addendum)
Pt states he has been having leg pain (left) while lying in the bed. Pt states there is no pain while ambulating or sitting.  ? ?Pt also reports having loose stools. ?

## 2021-03-12 NOTE — ED Provider Notes (Signed)
[place]    CSN: [id number] Arrival date & time: 03/12/21  1247    HISTORY   Chief Complaint  Patient presents with   Leg Pain   HPI Justin Holloway is a 88 y.o. male. Patient is here with wife today.  Patient states he has been having sharp, stabbing pain in his left lower leg on the lateral aspect of his thigh sometimes radiating down below his knee as well.  Patient states the pain is much worse when lying in bed so he has been sleeping sitting up on his couch.  Patient states he does not have any pain with ambulation or sitting.  Patient also complains of loose stools, states this is intermittent and not new.  Wife today states patient has been complaining with burning with urination, adds.  States patient is diabetic but that he does a good job keeping it well controlled.  EMR reviewed, last metabolic panel performed was in June 2022, his GFR was 19 with a serum creatinine of 3.06, his last hemoglobin A1c was 5.8 and this was measured in December 2022.  Urinalysis today revealed significant pyuria with no other abnormal findings.  X-ray today was abnormal and showed degenerative disc disease in lumbar and sacral spine.  The history is provided by the patient and the spouse.  Past Medical History:  Diagnosis Date   Arthritis    CKD (chronic kidney disease), stage IV (HCC)    Diabetes mellitus without complication ([place])    Glaucoma    "blurred vision", see Dr Gershon Crane yearly   Hyperlipidemia    Hypertension    TIA (transient ischemic attack)    Patient Active Problem List   Diagnosis Date Noted   Atherosclerosis of aorta ([place]) 08/22/2020   Shuffling gait 08/22/2020   Generalized weakness 05/10/2020   Failure to thrive in adult 05/10/2020   Chronic pain 05/10/2020   Constipation 05/10/2020   Class 1 drug-induced obesity with body mass index (BMI) of 34.0 to 34.9 in adult 05/10/2020   DNR (do not resuscitate) 05/10/2020   Hypertension    Hyperlipidemia     Glaucoma    CKD (chronic kidney disease), stage IV ([place])    Secondary renal hyperparathyroidism ([place]) 01/14/2020   Type 2 diabetes mellitus with stage 4 chronic kidney disease, with long-term current use of insulin ([place]) 09/10/2019   Peripheral vascular disease, unspecified ([place]) 05/28/2019   Diabetic neuropathy ([place]) 02/18/2019   Left upper quadrant pain 09/04/2018   Acute midline low back pain without sciatica 09/04/2018   CKD (chronic kidney disease) stage 4, GFR 15-29 ml/min (HCC) 11/21/2017   Hypertensive nephropathy 11/21/2017   Right foot pain 11/21/2017   Vertigo 09/09/2017   BPPV (benign paroxysmal positional vertigo), right 09/06/2017   Sensorineural hearing loss (SNHL), bilateral 09/06/2017   Ataxia 02/06/2017   UTI (lower urinary tract infection) 08/07/2015   Dizziness 08/07/2015   Acute renal failure superimposed on stage 3 chronic kidney disease ([place]) 08/07/2015   Hyperkalemia 08/07/2015   Left inguinal hernia 05/15/2013   Diabetes mellitus without complication ([place]) 22/02/5425   HLD (hyperlipidemia) 03/08/2006   HYPERTENSION, BENIGN SYSTEMIC 03/08/2006   IMPOTENCE, 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   PROTEINURIA 03/08/2006   Past Surgical History:  Procedure Laterality Date   INGUINAL HERNIA REPAIR Left 06/09/2013   Procedure: OPEN REPAIR LEFT INGUINAL HERNIA;  Surgeon: [name], MD;  Location: [facility];  Service: General;  Laterality: Left;   INSERTION OF MESH Left 06/09/2013   Procedure: INSERTION OF MESH;  Surgeon: [name], MD;  Location: [facility];  Service: General;  Laterality: Left;    Home Medications    Prior to Admission medications   Medication Sig Start Date End Date Taking? Authorizing Provider  Ascorbic Acid (VITAMIN C) 1000 MG tablet Take 1,000 mg by mouth daily.     [provider]  aspirin EC 81 MG tablet Take 81 mg by mouth every evening.    [provider]  Blood Glucose Monitoring Suppl (ONE TOUCH ULTRA 2) w/Device KIT Use as  directed to check blood sugars 2 times per day dx: e11.22 08/26/20   [name], MD  cholecalciferol (VITAMIN D) 1000 units tablet Take 2,000 Units by mouth daily.     [provider]  COMBIGAN 0.2-0.5 % ophthalmic solution Place 1 drop into both eyes every 12 (twelve) hours. 09/01/19   [provider]  diclofenac Sodium (VOLTAREN) 1 % GEL Apply 4 g topically 4 (four) times daily. Patient taking differently: Apply 2-4 g topically in the morning and at bedtime. shoulder 05/04/20   Deno Etienne, DO  DULoxetine (CYMBALTA) 30 MG capsule Take 1 capsule (30 mg total) by mouth daily. 11/16/20   Tomi Likens, Adam R, DO  furosemide (LASIX) 80 MG tablet TAKE 1 TABLET BY MOUTH IN  THE MORNING AND ONE-HALF  TABLET BY MOUTH IN THE  EARLY EVENING Patient taking differently: Take 40-80 mg by mouth See admin instructions. 80 mg in the morning and 40 mg in the evening. 06/30/19   [name], MD  glucose blood (ONETOUCH ULTRA) test strip USE STRIPS TO TEST BLOOD  SUGAR 3 TIMES DAILY 1 STRIP PER TEST 08/26/20   [name], MD  hydrALAZINE (APRESOLINE) 10 MG tablet Take 10 mg by mouth 3 (three) times daily. 01/21/20   [provider]  insulin degludec (TRESIBA) 200 UNIT/ML FlexTouch Pen Inject 24 Units into the skin at bedtime. 06/10/20   [name], MD  Iron-FA-B Cmp-C-Biot-Probiotic (FUSION PLUS) CAPS Take by mouth. 1 capsule per day Patient not taking: Reported on 11/16/2020    [provider]  Lancets [facility] DELICA PLUS [id number]) [place] 100 each by Does not apply route in the morning, at noon, and at bedtime. 04/28/19   Minette Brine, FNP  LUMIGAN 0.01 % SOLN Place 1 drop into both eyes 2 (two) times daily.  01/22/18   [provider]  Multiple Vitamin (MULTIVITAMIN WITH MINERALS) TABS tablet Take 1 tablet by mouth daily.    [provider]  polyethylene glycol powder (GLYCOLAX/MIRALAX) 17 GM/SCOOP powder Take 17 g by mouth daily as needed for mild constipation.     [provider]  pravastatin (PRAVACHOL) 40 MG tablet Take 1 tablet (40 mg total) by mouth at bedtime. 03/01/21   [name], MD  pregabalin (LYRICA) 25 MG capsule Take 1 capsule (25 mg total) by mouth at bedtime. Patient not taking: Reported on 11/16/2020 08/19/20   [name], NP  Semaglutide,0.25 or 0.5MG/DOS, (OZEMPIC, 0.25 OR 0.5 MG/DOSE,) 2 MG/1.5ML SOPN Inject 0.5 mg into the skin once a week. 06/10/20   [name], MD  tamsulosin (FLOMAX) 0.4 MG CAPS capsule Take 0.4 mg by mouth daily. 07/04/19   [provider]    Family History Family History  Problem Relation Age of Onset   Cancer Mother 52       breast   COPD Father 83       Congestive heart failure   Social History Social History   Tobacco Use  Smoking status: Former    Types: Cigars   Smokeless tobacco: Never   Tobacco comments:    socially, not long  Vaping Use   Vaping Use: Never used  Substance Use Topics   Alcohol use: Never   Drug use: Never   Allergies   Gabapentin  Review of Systems Review of Systems Pertinent findings noted in history of present illness.   Physical Exam Triage Vital Signs ED Triage Vitals  Enc Vitals Group     BP 11/05/20 0827 (!) 147/82     Pulse Rate 11/05/20 0827 72     Resp 11/05/20 0827 18     Temp 11/05/20 0827 98.3 F (36.8 C)     Temp Source 11/05/20 0827 Oral     SpO2 11/05/20 0827 98 %     Weight --      Height --      Head Circumference --      Peak Flow --      Pain Score 11/05/20 0826 5     Pain Loc --      Pain Edu? --      Excl. in [place]? --   No data found.  Updated Vital Signs BP 105/63 (BP Location: Left Arm)    Pulse 99    Temp 97.8 F (36.6 C) (Oral)    Resp 20    SpO2 97%   Physical Exam Vitals and nursing note reviewed.  Constitutional:      General: He is not in acute distress.    Appearance: Normal appearance. He is not ill-appearing.  HENT:     Head: Normocephalic and atraumatic.  Eyes:     General: Lids are  normal.        Right eye: No discharge.        Left eye: No discharge.     Extraocular Movements: Extraocular movements intact.     Conjunctiva/sclera: Conjunctivae normal.     Right eye: Right conjunctiva is not injected.     Left eye: Left conjunctiva is not injected.  Neck:     Trachea: Trachea and phonation normal.  Cardiovascular:     Rate and Rhythm: Normal rate and regular rhythm.     Pulses: Normal pulses.     Heart sounds: Normal heart sounds. No murmur heard.   No friction rub. No gallop.  Pulmonary:     Effort: Pulmonary effort is normal. No accessory muscle usage, prolonged expiration or respiratory distress.     Breath sounds: Normal breath sounds. No stridor, decreased air movement or transmitted upper airway sounds. No decreased breath sounds, wheezing, rhonchi or rales.  Chest:     Chest wall: No tenderness.  Musculoskeletal:        General: Tenderness present.     Cervical back: Normal range of motion and neck supple. Normal range of motion.     Comments: Forward flexion at hips limited secondary to pain, positive straight leg on left.  Lymphadenopathy:     Cervical: No cervical adenopathy.  Skin:    General: Skin is warm and dry.     Findings: No erythema or rash.  Neurological:     General: No focal deficit present.     Mental Status: He is alert and oriented to person, place, and time.  Psychiatric:        Mood and Affect: Mood normal.        Behavior: Behavior normal.    Visual Acuity Right Eye Distance:   Left Eye  Distance:   Bilateral Distance:    Right Eye Near:   Left Eye Near:    Bilateral Near:     UC Couse / Diagnostics / Procedures:    EKG  Radiology DG Lumbar Spine Complete  Result Date: 03/12/2021 CLINICAL DATA:  Left sciatic nerve pain, lower back pain EXAM: LUMBAR SPINE - COMPLETE 4+ VIEW COMPARISON:  CT examination dated October 21, 2018 FINDINGS: There is no evidence of lumbar spine fracture. Alignment is normal. Moderate  multilevel DA and disc disease with disc space narrowing and prominent osteophytes. There is associated facet joint arthropathy. IMPRESSION: 1.  No evidence of acute fracture or subluxation. 2. Moderate multilevel degenerate disc disease with disc space narrowing, marginal osteophytes and associated facet joint arthropathy prominent at L4-L5 and L5-S1. Electronically Signed   By: Keane Police D.O.   On: 03/12/2021 13:41    Procedures Procedures (including critical care time)  UC Diagnoses / Final Clinical Impressions(s)   I have reviewed the triage vital signs and the nursing notes.  Pertinent labs & imaging results that were available during my care of the patient were reviewed by me and considered in my medical decision making (see chart for details).    Final diagnoses:  Burning with urination  Pyuria  Chronic left-sided low back pain with left-sided sciatica  Sciatica of left side  Lumbar degenerative disc disease  Chronic kidney disease, unspecified CKD stage  Type 2 diabetes mellitus with other specified complication, unspecified whether long term insulin use ([place])   Given patient's significant renal dysfunction, NSAIDs contraindicated, given patient's history of type 2 diabetes, even though is well controlled, I do not believe that steroids are indicated either.  Patient previously been prescribed pregabalin presumably for diabetic peripheral neuropathy, will renew this prescription at a renal dose.  I have also renewed his prescription for tramadol that he is received in the past, this also at a renal dose.  Finally, I have renewed Voltaren gel to be used 4 times daily as needed.  Patient advised to follow-up with his primary care provider soon as possible for possible referral to spine specialist.  Patient denies saddle anesthesia, loss of bowel or bladder function this time. Pt states he has also had PT in the past, pt may benefit from a referral to PT as well.  Return precautions  advised. ED Prescriptions     Medication Sig Dispense Auth. Provider   diclofenac Sodium (VOLTAREN) 1 % GEL Apply 4 g topically 4 (four) times daily. 350 g Lynden Oxford Scales, PA-C   traMADol (ULTRAM) 50 MG tablet Take 1 tablet (50 mg total) by mouth every 12 (twelve) hours as needed for up to 5 days for severe pain or moderate pain. 10 tablet Lynden Oxford Scales, PA-C   pregabalin (LYRICA) 25 MG capsule Take 1 capsule (25 mg total) by mouth 2 (two) times daily for 15 days. 30 capsule Lynden Oxford Scales, PA-C      PDMP not reviewed this encounter.  Pending results:  Labs Reviewed  POCT URINALYSIS DIPSTICK, ED / UC - Abnormal; Notable for the following components:      Result Value   Leukocytes,Ua MODERATE (*)    All other components within normal limits  URINE CULTURE    Medications Ordered in UC: Medications - No data to display  Disposition Upon Discharge:  Condition: stable for discharge home Home: take medications as prescribed; routine discharge instructions as discussed; follow up as advised.  Patient presented with an acute illness  with associated systemic symptoms and significant discomfort requiring urgent management. In my opinion, this is a condition that a prudent lay person (someone who possesses an average knowledge of health and medicine) may potentially expect to result in complications if not addressed urgently such as respiratory distress, impairment of bodily function or dysfunction of bodily organs.   Routine symptom specific, illness specific and/or disease specific instructions were discussed with the patient and/or caregiver at length.   As such, the patient has been evaluated and assessed, work-up was performed and treatment was provided in alignment with urgent care protocols and evidence based medicine.  Patient/parent/caregiver has been advised that the patient may require follow up for further testing and treatment if the symptoms continue in  spite of treatment, as clinically indicated and appropriate.  If the patient was tested for COVID-19, Influenza and/or RSV, then the patient/parent/guardian was advised to isolate at home pending the results of his/her diagnostic coronavirus test and potentially longer if theyre positive. I have also advised pt that if his/her COVID-19 test returns positive, it's recommended to self-isolate for at least 10 days after symptoms first appeared AND until fever-free for 24 hours without fever reducer AND other symptoms have improved or resolved. Discussed self-isolation recommendations as well as instructions for household member/close contacts as per the [facility] and [place] DHHS, and also gave patient the [place] packet with this information.  Patient/parent/caregiver has been advised to return to the [facility] or PCP in 3-5 days if no better; to PCP or the Emergency Department if new signs and symptoms develop, or if the current signs or symptoms continue to change or worsen for further workup, evaluation and treatment as clinically indicated and appropriate  The patient will follow up with their current PCP if and as advised. If the patient does not currently have a PCP we will assist them in obtaining one.   The patient may need specialty follow up if the symptoms continue, in spite of conservative treatment and management, for further workup, evaluation, consultation and treatment as clinically indicated and appropriate.   Patient/parent/caregiver verbalized understanding and agreement of plan as discussed.  All questions were addressed during visit.  Please see discharge instructions below for further details of plan.  Discharge Instructions:   Discharge Instructions      The urinalysis that we performed in the clinic today was abnormal.  Urine culture will be performed per our protocol.  The urinalysis was not concerning for any signs of dehydration.  The x-ray we performed today did not demonstrate any  concerns for bowel dysfunction.  Please follow-up with your primary care provider regarding your recent episodes of diarrhea.  It may be related to your antidiabetic medications.  The result of the urine culture will be available in the next 3 to 5 days and will be posted to your MyChart account.  If there is an abnormal finding, you will be contacted by phone and advised of further treatment recommendations, if any.   If you have not had complete resolution of your urinary tract symptoms after completing treatment as prescribed, please return to urgent care for repeat evaluation or follow-up with your primary care provider.   The x-ray of your lumbar spine revealed moderate degenerative disc disease, bone spurs and some inflammation of the bones in your lower lumbar spine, lumbar spine vertebrae numbers 4 and 5 as well as in your first sacral spin vertebra.    Because you have significant kidney dysfunction, I am unable to provide you  with any oral nonsteroidal anti-inflammatory pain medication such as ibuprofen or naproxen.  Because you are not diabetic, I am unable to prescribe you with any steroid all anti-inflammatory medications.    Therefore, because you have been on pregabalin and tramadol in the past, I recommend that you take these 2 medications for the short-term to relieve your more immediate pain.  I also took the liberty of renewing a prior prescription for Voltaren gel that you can apply 4 times daily as needed for pain relief as well.  Please be sure you follow-up with your primary care provider regarding the findings of your x-ray today as soon as possible to discuss referral to orthopedics or spine specialty for further evaluation and treatment.  Thank you for visiting urgent care today.  I appreciate the opportunity to participate in your care.       This office note has been dictated using Museum/gallery curator.  Unfortunately, and despite my best efforts, this  method of dictation can sometimes lead to occasional typographical or grammatical errors.  I apologize in advance if this occurs.     Lynden Oxford Scales, PA-C 03/12/21 1658

## 2021-03-14 LAB — URINE CULTURE

## 2021-03-16 DIAGNOSIS — H259 Unspecified age-related cataract: Secondary | ICD-10-CM | POA: Diagnosis not present

## 2021-03-16 DIAGNOSIS — H33192 Other retinoschisis and retinal cysts, left eye: Secondary | ICD-10-CM | POA: Diagnosis not present

## 2021-03-16 DIAGNOSIS — H40013 Open angle with borderline findings, low risk, bilateral: Secondary | ICD-10-CM | POA: Diagnosis not present

## 2021-03-16 DIAGNOSIS — H524 Presbyopia: Secondary | ICD-10-CM | POA: Diagnosis not present

## 2021-03-16 DIAGNOSIS — H43813 Vitreous degeneration, bilateral: Secondary | ICD-10-CM | POA: Diagnosis not present

## 2021-03-16 DIAGNOSIS — H40033 Anatomical narrow angle, bilateral: Secondary | ICD-10-CM | POA: Diagnosis not present

## 2021-03-16 DIAGNOSIS — H33002 Unspecified retinal detachment with retinal break, left eye: Secondary | ICD-10-CM | POA: Diagnosis not present

## 2021-03-16 DIAGNOSIS — H348322 Tributary (branch) retinal vein occlusion, left eye, stable: Secondary | ICD-10-CM | POA: Diagnosis not present

## 2021-03-16 DIAGNOSIS — H3562 Retinal hemorrhage, left eye: Secondary | ICD-10-CM | POA: Diagnosis not present

## 2021-03-16 LAB — HM DIABETES EYE EXAM

## 2021-03-17 ENCOUNTER — Other Ambulatory Visit: Payer: Self-pay

## 2021-03-17 ENCOUNTER — Ambulatory Visit (INDEPENDENT_AMBULATORY_CARE_PROVIDER_SITE_OTHER): Payer: Medicare Other | Admitting: Internal Medicine

## 2021-03-17 ENCOUNTER — Encounter: Payer: Self-pay | Admitting: Internal Medicine

## 2021-03-17 ENCOUNTER — Telehealth: Payer: Medicare Other

## 2021-03-17 ENCOUNTER — Ambulatory Visit: Payer: Medicare Other | Admitting: Internal Medicine

## 2021-03-17 ENCOUNTER — Ambulatory Visit (INDEPENDENT_AMBULATORY_CARE_PROVIDER_SITE_OTHER): Payer: Medicare Other

## 2021-03-17 VITALS — BP 130/70 | HR 91 | Temp 98.1°F | Ht 65.4 in | Wt 202.0 lb

## 2021-03-17 DIAGNOSIS — R300 Dysuria: Secondary | ICD-10-CM

## 2021-03-17 DIAGNOSIS — Z23 Encounter for immunization: Secondary | ICD-10-CM | POA: Diagnosis not present

## 2021-03-17 DIAGNOSIS — I129 Hypertensive chronic kidney disease with stage 1 through stage 4 chronic kidney disease, or unspecified chronic kidney disease: Secondary | ICD-10-CM

## 2021-03-17 DIAGNOSIS — N184 Chronic kidney disease, stage 4 (severe): Secondary | ICD-10-CM

## 2021-03-17 DIAGNOSIS — Z794 Long term (current) use of insulin: Secondary | ICD-10-CM | POA: Diagnosis not present

## 2021-03-17 DIAGNOSIS — E559 Vitamin D deficiency, unspecified: Secondary | ICD-10-CM

## 2021-03-17 DIAGNOSIS — M5432 Sciatica, left side: Secondary | ICD-10-CM | POA: Diagnosis not present

## 2021-03-17 DIAGNOSIS — M79605 Pain in left leg: Secondary | ICD-10-CM

## 2021-03-17 DIAGNOSIS — E1122 Type 2 diabetes mellitus with diabetic chronic kidney disease: Secondary | ICD-10-CM

## 2021-03-17 DIAGNOSIS — E1136 Type 2 diabetes mellitus with diabetic cataract: Secondary | ICD-10-CM

## 2021-03-17 DIAGNOSIS — Z6833 Body mass index (BMI) 33.0-33.9, adult: Secondary | ICD-10-CM

## 2021-03-17 DIAGNOSIS — E6609 Other obesity due to excess calories: Secondary | ICD-10-CM

## 2021-03-17 DIAGNOSIS — E0841 Diabetes mellitus due to underlying condition with diabetic mononeuropathy: Secondary | ICD-10-CM

## 2021-03-17 DIAGNOSIS — E785 Hyperlipidemia, unspecified: Secondary | ICD-10-CM

## 2021-03-17 NOTE — Patient Instructions (Signed)
Pain Scale Information, Adult ?A pain scale is a tool to help you describe your pain. It often uses pictures, numbers, or words. It can help you explain to your health care provider: ?How bad your pain is. ?What your pain feels like, such as dull, achy, throbbing, or sharp. ?Where pain is located in your body. ?How often you have pain. ?Pain scales range from simple to complex and are often in the form of a survey for adults. Which pain scale your health care provider uses depends on your condition. Some pain scales measure only pain intensity. These can be useful if the cause of your pain is known. Other pain scales measure more factors, including if you can do your usual activities and how the pain is affecting your mood. These scales are useful if you have long-term (chronic) pain. ?How is a pain scale used? ?A pain scale is used to help your health care provider guide your treatment plan. It can be used in a health care setting or at home. In a health care setting, your health care provider will ask you the questions on the pain scale or have you fill out a form. At home, you will be asked to record your pain symptoms. If you have chronic pain, you may have to use the pain scale for several weeks or months. Recording your pain symptoms helps your health care provider see how your pain changes over time. ?Why is it important to communicate about pain? ?Talking about your pain helps your health care provider understand what you are feeling and how to treat your condition. Being in pain can make you feel unwell and unhappy. It can also interfere with your daily activities, such as work, school, or hobbies. Your relationships can also be affected. Your health care provider wants to help control your pain to improve your mood and daily life. ?What are some questions to ask my health care provider? ?How accurate are the results of this pain scale? ?How often should I use a pain scale? ?What is causing my pain? ?How  long will I need treatment? ?What are the risks of treatment with medicines? ?What other treatments can help? ?What if my pain does not go away with treatment? ?Should I keep a record of pain symptoms or pain scale results at home? ?Summary ?A pain scale is a tool to help you describe your pain. ?Talking about your pain helps your health care provider understand what you are feeling and how to treat your condition. ?Your health care provider wants to help control your pain to improve your mood and daily life. ?This information is not intended to replace advice given to you by your health care provider. Make sure you discuss any questions you have with your health care provider. ?Document Revised: 10/28/2018 Document Reviewed: 10/28/2018 ?Elsevier Patient Education ? [place]. ? ?

## 2021-03-17 NOTE — Progress Notes (Unsigned)
This visit occurred during the SARS-CoV-2 public health emergency.  Safety protocols were in place, including screening questions prior to the visit, additional usage of staff PPE, and extensive cleaning of exam room while observing appropriate contact time as indicated for disinfecting solutions.  Subjective:     Patient ID: Justin Holloway , male    DOB: 01/17/1929 , 88 y.o.   MRN: [id number]   Chief Complaint  Patient presents with   Leg Pain    HPI  Leg Pain     Past Medical History:  Diagnosis Date   Arthritis    CKD (chronic kidney disease), stage IV (HCC)    Diabetes mellitus without complication (HCC)    Glaucoma    "blurred vision", see Dr Gershon Crane yearly   Hyperlipidemia    Hypertension    TIA (transient ischemic attack)      Family History  Problem Relation Age of Onset   Cancer Mother 60       breast   COPD Father 81       Congestive heart failure     Current Outpatient Medications:    Ascorbic Acid (VITAMIN C) 1000 MG tablet, Take 1,000 mg by mouth daily. , Disp: , Rfl:    aspirin EC 81 MG tablet, Take 81 mg by mouth every evening., Disp: , Rfl:    Blood Glucose Monitoring Suppl (ONE TOUCH ULTRA 2) w/Device KIT, Use as directed to check blood sugars 2 times per day dx: e11.22, Disp: 1 kit, Rfl: 0   cholecalciferol (VITAMIN D) 1000 units tablet, Take 2,000 Units by mouth daily. , Disp: , Rfl:    COMBIGAN 0.2-0.5 % ophthalmic solution, Place 1 drop into both eyes every 12 (twelve) hours., Disp: , Rfl:    diclofenac Sodium (VOLTAREN) 1 % GEL, Apply 4 g topically 4 (four) times daily., Disp: 350 g, Rfl: 5   DULoxetine (CYMBALTA) 30 MG capsule, Take 1 capsule (30 mg total) by mouth daily., Disp: 30 capsule, Rfl: 5   furosemide (LASIX) 80 MG tablet, TAKE 1 TABLET BY MOUTH IN  THE MORNING AND ONE-HALF  TABLET BY MOUTH IN THE  EARLY EVENING (Patient taking differently: Take 40-80 mg by mouth See admin instructions. 80 mg in the morning and 40 mg in the evening.),  Disp: 135 tablet, Rfl: 3   glucose blood (ONETOUCH ULTRA) test strip, USE STRIPS TO TEST BLOOD  SUGAR 3 TIMES DAILY 1 STRIP PER TEST, Disp: 300 strip, Rfl: 3   hydrALAZINE (APRESOLINE) 10 MG tablet, Take 10 mg by mouth 3 (three) times daily., Disp: , Rfl:    insulin degludec (TRESIBA) 200 UNIT/ML FlexTouch Pen, Inject 24 Units into the skin at bedtime., Disp: 3 mL, Rfl: 1   Iron-FA-B Cmp-C-Biot-Probiotic (FUSION PLUS) CAPS, Take by mouth. 1 capsule per day, Disp: , Rfl:    Lancets (ONETOUCH DELICA PLUS [id number]) MISC, 100 each by Does not apply route in the morning, at noon, and at bedtime., Disp: 300 each, Rfl: 3   LUMIGAN 0.01 % SOLN, Place 1 drop into both eyes 2 (two) times daily. , Disp: , Rfl:    Multiple Vitamin (MULTIVITAMIN WITH MINERALS) TABS tablet, Take 1 tablet by mouth daily., Disp: , Rfl:    polyethylene glycol powder (GLYCOLAX/MIRALAX) 17 GM/SCOOP powder, Take 17 g by mouth daily as needed for mild constipation., Disp: , Rfl:    pravastatin (PRAVACHOL) 40 MG tablet, Take 1 tablet (40 mg total) by mouth at bedtime., Disp: 60 tablet, Rfl: 1  pregabalin (LYRICA) 25 MG capsule, Take 1 capsule (25 mg total) by mouth 2 (two) times daily for 15 days., Disp: 30 capsule, Rfl: 0   Semaglutide,0.25 or 0.5MG/DOS, (OZEMPIC, 0.25 OR 0.5 MG/DOSE,) 2 MG/1.5ML SOPN, Inject 0.5 mg into the skin once a week., Disp: 1.5 mL, Rfl: 1   tamsulosin (FLOMAX) 0.4 MG CAPS capsule, Take 0.4 mg by mouth daily., Disp: , Rfl:    traMADol (ULTRAM) 50 MG tablet, Take 1 tablet (50 mg total) by mouth every 12 (twelve) hours as needed for up to 5 days for severe pain or moderate pain., Disp: 10 tablet, Rfl: 0   Allergies  Allergen Reactions   Gabapentin Itching     Review of Systems  Constitutional: Negative.   Respiratory: Negative.    Cardiovascular: Negative.   Gastrointestinal: Negative.   Musculoskeletal:  Positive for arthralgias.  Neurological: Negative.   Psychiatric/Behavioral: Negative.       Today's Vitals   03/17/21 1441  BP: 130/70  Pulse: 91  Temp: 98.1 F (36.7 C)  TempSrc: Oral  Weight: 202 lb (91.6 kg)  Height: 5' 5.4" (1.661 m)  PainSc: 10-Worst pain ever   Body mass index is 33.2 kg/m.   Objective:  Physical Exam Vitals and nursing note reviewed.  Constitutional:      Appearance: Normal appearance.  HENT:     Head: Normocephalic.  Cardiovascular:     Rate and Rhythm: Normal rate and regular rhythm.     Heart sounds: Normal heart sounds.  Pulmonary:     Effort: Pulmonary effort is normal.     Breath sounds: Normal breath sounds.  Musculoskeletal:     Cervical back: Normal range of motion.     Comments: Ambulatory with walker   Skin:    General: Skin is warm.  Neurological:     General: No focal deficit present.     Mental Status: He is alert.  Psychiatric:        Mood and Affect: Mood normal.        Assessment And Plan:     1. Left leg pain  2. Type 2 diabetes mellitus with stage 4 chronic kidney disease, with long-term current use of insulin (HCC) - Hemoglobin A1c  3. Controlled type 2 diabetes mellitus with cataract ([place]) Comments: Chronic, he has had recent eye exam and has been diagnosed with cataracts. He plans to schedule surgery in the  near future.   4. Hypertensive nephropathy Comments: Chronic, controlled. No med changes. He is encouraged to follow low sodium diet.   5. Class 1 obesity due to excess calories with serious comorbidity and body mass index (BMI) of 33.0 to 33.9 in adult Comments: He is encouraged to strive for BMI <30 to decrease cardiac risk. He is encouraged to continue with his chair exercises.   6. Dysuria - Culture, Urine     Patient was given opportunity to ask questions. Patient verbalized understanding of the plan and was able to repeat key elements of the plan. All questions were answered to their satisfaction.  [name], MD   I, [name], MD, have reviewed all documentation for this  visit. The documentation on 03/17/21 for the exam, diagnosis, procedures, and orders are all accurate and complete.   IF YOU HAVE BEEN REFERRED TO A SPECIALIST, IT MAY TAKE 1-2 WEEKS TO SCHEDULE/PROCESS THE REFERRAL. IF YOU HAVE NOT HEARD FROM US/SPECIALIST IN TWO WEEKS, PLEASE GIVE [place] A CALL AT [phone].   THE PATIENT  IS ENCOURAGED TO PRACTICE SOCIAL DISTANCING DUE TO THE COVID-19 PANDEMIC.

## 2021-03-17 NOTE — Progress Notes (Signed)
?Industrial/product designer as a Education administrator for [name], MD.,have documented all relevant documentation on the behalf of [name], MD,as directed by  [name], MD while in the presence of [name], MD. ? ?This visit occurred during the SARS-CoV-2 public health emergency.  Safety protocols were in place, including screening questions prior to the visit, additional usage of staff PPE, and extensive cleaning of exam room while observing appropriate contact time as indicated for disinfecting solutions. ? ?Subjective:  ?  ? Patient ID: Justin Holloway , male    DOB: 02-Jan-1930 , 88 y.o.   MRN: [id number] ? ? ?Chief Complaint  ?Patient presents with  ? Leg Pain  ? ? ?HPI ? ?Patient presents today for treatment of left leg pain.  ? ?Leg Pain  ?  ? ?Past Medical History:  ?Diagnosis Date  ? Arthritis   ? CKD (chronic kidney disease), stage IV ([place])   ? Diabetes mellitus without complication ([place])   ? Glaucoma   ? "blurred vision", see Dr Gershon Crane yearly  ? Hyperlipidemia   ? Hypertension   ? TIA (transient ischemic attack)   ?  ? ?Family History  ?Problem Relation Age of Onset  ? Cancer Mother 7  ?     breast  ? COPD Father 34  ?     Congestive heart failure  ? ? ? ?Current Outpatient Medications:  ?  Ascorbic Acid (VITAMIN C) 1000 MG tablet, Take 1,000 mg by mouth daily. , Disp: , Rfl:  ?  aspirin EC 81 MG tablet, Take 81 mg by mouth every evening., Disp: , Rfl:  ?  Blood Glucose Monitoring Suppl (ONE TOUCH ULTRA 2) w/Device KIT, Use as directed to check blood sugars 2 times per day dx: e11.22, Disp: 1 kit, Rfl: 0 ?  cholecalciferol (VITAMIN D) 1000 units tablet, Take 2,000 Units by mouth daily. , Disp: , Rfl:  ?  COMBIGAN 0.2-0.5 % ophthalmic solution, Place 1 drop into both eyes every 12 (twelve) hours., Disp: , Rfl:  ?  diclofenac Sodium (VOLTAREN) 1 % GEL, Apply 4 g topically 4 (four) times daily., Disp: 350 g, Rfl: 5 ?  DULoxetine (CYMBALTA) 30 MG capsule, Take 1 capsule (30 mg total) by mouth  daily., Disp: 30 capsule, Rfl: 5 ?  furosemide (LASIX) 80 MG tablet, TAKE 1 TABLET BY MOUTH IN  THE MORNING AND ONE-HALF  TABLET BY MOUTH IN THE  EARLY EVENING (Patient taking differently: Take 40-80 mg by mouth See admin instructions. 80 mg in the morning and 40 mg in the evening.), Disp: 135 tablet, Rfl: 3 ?  glucose blood (ONETOUCH ULTRA) test strip, USE STRIPS TO TEST BLOOD  SUGAR 3 TIMES DAILY 1 STRIP PER TEST, Disp: 300 strip, Rfl: 3 ?  hydrALAZINE (APRESOLINE) 10 MG tablet, Take 10 mg by mouth 3 (three) times daily., Disp: , Rfl:  ?  insulin degludec (TRESIBA) 200 UNIT/ML FlexTouch Pen, Inject 24 Units into the skin at bedtime., Disp: 3 mL, Rfl: 1 ?  Iron-FA-B Cmp-C-Biot-Probiotic (FUSION PLUS) CAPS, Take by mouth. 1 capsule per day, Disp: , Rfl:  ?  Lancets (ONETOUCH DELICA PLUS [id number]) MISC, 100 each by Does not apply route in the morning, at noon, and at bedtime., Disp: 300 each, Rfl: 3 ?  LUMIGAN 0.01 % SOLN, Place 1 drop into both eyes 2 (two) times daily. , Disp: , Rfl:  ?  Multiple Vitamin (MULTIVITAMIN WITH MINERALS) TABS tablet, Take 1 tablet by mouth daily., Disp: ,  Rfl:  ?  polyethylene glycol powder (GLYCOLAX/MIRALAX) 17 GM/SCOOP powder, Take 17 g by mouth daily as needed for mild constipation., Disp: , Rfl:  ?  pravastatin (PRAVACHOL) 40 MG tablet, Take 1 tablet (40 mg total) by mouth at bedtime., Disp: 60 tablet, Rfl: 1 ?  pregabalin (LYRICA) 25 MG capsule, Take 1 capsule (25 mg total) by mouth 2 (two) times daily for 15 days., Disp: 30 capsule, Rfl: 0 ?  Semaglutide,0.25 or 0.5MG/DOS, (OZEMPIC, 0.25 OR 0.5 MG/DOSE,) 2 MG/1.5ML SOPN, Inject 0.5 mg into the skin once a week., Disp: 1.5 mL, Rfl: 1 ?  tamsulosin (FLOMAX) 0.4 MG CAPS capsule, Take 0.4 mg by mouth daily., Disp: , Rfl:  ?  traMADol (ULTRAM) 50 MG tablet, Take 1 tablet (50 mg total) by mouth every 12 (twelve) hours as needed for up to 5 days for severe pain or moderate pain., Disp: 10 tablet, Rfl: 0  ? ?Allergies  ?Allergen  Reactions  ? Gabapentin Itching  ?  ? ?Review of Systems  ?Musculoskeletal:  Positive for myalgias.   ? ?Today's Vitals  ? 03/17/21 1441  ?Pulse: 91  ?Temp: 98.1 ?F (36.7 ?C)  ?TempSrc: Oral  ?Weight: 202 lb (91.6 kg)  ?Height: 5' 5.4" (1.661 m)  ?PainSc: 10-Worst pain ever  ? ?Body mass index is 33.2 kg/m?.  ?Wt Readings from Last 3 Encounters:  ?03/17/21 202 lb (91.6 kg)  ?12/15/20 202 lb 3.2 oz (91.7 kg)  ?11/16/20 205 lb (93 kg)  ? ? ?Objective:  ?Physical Exam  ? ?   ?Assessment And Plan:  ?   ?1. Left leg pain ?  ? ? ?Patient was given opportunity to ask questions. Patient verbalized understanding of the plan and was able to repeat key elements of the plan. All questions were answered to their satisfaction.  ?Justin Holloway  ? ?I, Octavio Manns, have reviewed all documentation for this visit. The documentation on 03/17/21 for the exam, diagnosis, procedures, and orders are all accurate and complete.  ? ?IF YOU HAVE BEEN REFERRED TO A SPECIALIST, IT MAY TAKE 1-2 WEEKS TO SCHEDULE/PROCESS THE REFERRAL. IF YOU HAVE NOT HEARD FROM US/SPECIALIST IN TWO WEEKS, PLEASE GIVE [place] A CALL AT [phone].  ? ?THE PATIENT IS ENCOURAGED TO PRACTICE SOCIAL DISTANCING DUE TO THE COVID-19 PANDEMIC.   ?

## 2021-03-18 LAB — HEMOGLOBIN A1C
Est. average glucose Bld gHb Est-mCnc: 126 mg/dL
Hgb A1c MFr Bld: 6 % — ABNORMAL HIGH (ref 4.8–5.6)

## 2021-03-18 NOTE — Chronic Care Management (AMB) (Signed)
Chronic Care Management   CCM RN Visit Note  03/17/2021 Name: Justin Holloway MRN: [id number] DOB: April 24, 1929  Subjective: Justin Holloway is a 88 y.o. year old male who is a primary care patient of Justin Chard, MD. The care management team was consulted for assistance with disease management and care coordination needs.    Engaged with patient by telephone for follow up visit in response to provider referral for case management and/or care coordination services.   Consent to Services:  The patient was given information about Chronic Care Management services, agreed to services, and gave verbal consent prior to initiation of services.  Please see initial visit note for detailed documentation.   Patient agreed to services and verbal consent obtained.   Assessment: Review of patient past medical history, allergies, medications, health status, including review of consultants reports, laboratory and other test data, was performed as part of comprehensive evaluation and provision of chronic care management services.   SDOH (Social Determinants of Health) assessments and interventions performed:  Yes, no acute challenges   CCM Care Plan  Allergies  Allergen Reactions   Gabapentin Itching    Outpatient Encounter Medications as of 03/17/2021  Medication Sig   Ascorbic Acid (VITAMIN C) 1000 MG tablet Take 1,000 mg by mouth daily.    aspirin EC 81 MG tablet Take 81 mg by mouth every evening.   Blood Glucose Monitoring Suppl (ONE TOUCH ULTRA 2) w/Device KIT Use as directed to check blood sugars 2 times per day dx: e11.22   cholecalciferol (VITAMIN D) 1000 units tablet Take 2,000 Units by mouth daily.    COMBIGAN 0.2-0.5 % ophthalmic solution Place 1 drop into both eyes every 12 (twelve) hours.   diclofenac Sodium (VOLTAREN) 1 % GEL Apply 4 g topically 4 (four) times daily.   DULoxetine (CYMBALTA) 30 MG capsule Take 1 capsule (30 mg total) by mouth daily.   furosemide (LASIX) 80 MG tablet  TAKE 1 TABLET BY MOUTH IN  THE MORNING AND ONE-HALF  TABLET BY MOUTH IN THE  EARLY EVENING (Patient taking differently: Take 40-80 mg by mouth See admin instructions. 80 mg in the morning and 40 mg in the evening.)   glucose blood (ONETOUCH ULTRA) test strip USE STRIPS TO TEST BLOOD  SUGAR 3 TIMES DAILY 1 STRIP PER TEST   hydrALAZINE (APRESOLINE) 10 MG tablet Take 10 mg by mouth 3 (three) times daily.   insulin degludec (TRESIBA) 200 UNIT/ML FlexTouch Pen Inject 24 Units into the skin at bedtime.   Iron-FA-B Cmp-C-Biot-Probiotic (FUSION PLUS) CAPS Take by mouth. 1 capsule per day   Lancets (ONETOUCH DELICA PLUS VELFYB01B) MISC 100 each by Does not apply route in the morning, at noon, and at bedtime.   LUMIGAN 0.01 % SOLN Place 1 drop into both eyes 2 (two) times daily.    Multiple Vitamin (MULTIVITAMIN WITH MINERALS) TABS tablet Take 1 tablet by mouth daily.   polyethylene glycol powder (GLYCOLAX/MIRALAX) 17 GM/SCOOP powder Take 17 g by mouth daily as needed for mild constipation.   pravastatin (PRAVACHOL) 40 MG tablet Take 1 tablet (40 mg total) by mouth at bedtime.   pregabalin (LYRICA) 25 MG capsule Take 1 capsule (25 mg total) by mouth 2 (two) times daily for 15 days.   Semaglutide,0.25 or 0.5MG/DOS, (OZEMPIC, 0.25 OR 0.5 MG/DOSE,) 2 MG/1.5ML SOPN Inject 0.5 mg into the skin once a week.   tamsulosin (FLOMAX) 0.4 MG CAPS capsule Take 0.4 mg by mouth daily.   [EXPIRED] traMADol (ULTRAM) 50  MG tablet Take 1 tablet (50 mg total) by mouth every 12 (twelve) hours as needed for up to 5 days for severe pain or moderate pain.   No facility-administered encounter medications on file as of 03/17/2021.    Patient Active Problem List   Diagnosis Date Noted   Atherosclerosis of aorta ([place]) 08/22/2020   Shuffling gait 08/22/2020   Generalized weakness 05/10/2020   Failure to thrive in adult 05/10/2020   Chronic pain 05/10/2020   Constipation 05/10/2020   Class 1 drug-induced obesity with body mass  index (BMI) of 34.0 to 34.9 in adult 05/10/2020   DNR (do not resuscitate) 05/10/2020   Hypertension    Hyperlipidemia    Glaucoma    CKD (chronic kidney disease), stage IV ([place])    Secondary renal hyperparathyroidism ([place]) 01/14/2020   Type 2 diabetes mellitus with stage 4 chronic kidney disease, with long-term current use of insulin ([place]) 09/10/2019   Peripheral vascular disease, unspecified ([place]) 05/28/2019   Diabetic neuropathy ([place]) 02/18/2019   Left upper quadrant pain 09/04/2018   Acute midline low back pain without sciatica 09/04/2018   CKD (chronic kidney disease) stage 4, GFR 15-29 ml/min (HCC) 11/21/2017   Hypertensive nephropathy 11/21/2017   Right foot pain 11/21/2017   Vertigo 09/09/2017   BPPV (benign paroxysmal positional vertigo), right 09/06/2017   Sensorineural hearing loss (SNHL), bilateral 09/06/2017   Ataxia 02/06/2017   UTI (lower urinary tract infection) 08/07/2015   Dizziness 08/07/2015   Acute renal failure superimposed on stage 3 chronic kidney disease ([place]) 08/07/2015   Hyperkalemia 08/07/2015   Left inguinal hernia 05/15/2013   Diabetes mellitus without complication ([place]) 73/41/9379   HLD (hyperlipidemia) 03/08/2006   HYPERTENSION, BENIGN SYSTEMIC 03/08/2006   IMPOTENCE, 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   PROTEINURIA 03/08/2006    Conditions to be addressed/monitored: DM, CKD Stage 4, Hypertensive Nephropathy, Diabetic mononeuropathy associated with type 2 Diabetes, Vitamin D deficiency, HLD  Care Plan : RN Care Manager Plan of Care  Updates made by Lynne Logan, RN since 03/17/2021 12:00 AM     Problem: No plan of care established for management of chronic disease states (DM, CKD Stage 4, Hypertensive Nephropathy, Diabetic mononeuropathy associated with type 2 Diabetes, Vitamin D deficiency, HLD)   Priority: High     Long-Range Goal: Establish plan of care for management of chronic disease states (DM, CKD Stage 4, Hypertensive Nephropathy, Diabetic  mononeuropathy associated with type 2 Diabetes, Vitamin D deficiency, HLD)   Start Date: 11/17/2020  Expected End Date: 11/17/2021  Recent Progress: On track  Priority: High  Note:   Current Barriers:  Knowledge Deficits related to plan of care for management of DM, CKD Stage 4, Hypertensive Nephropathy, Diabetic mononeuropathy associated with type 2 Diabetes, Vitamin D deficiency, HLD Chronic Disease Management support and education needs related to DM, CKD Stage 4, Hypertensive Nephropathy, Diabetic mononeuropathy associated with type 2 Diabetes, Vitamin D deficiency, HLD  RNCM Clinical Goal(s):  Patient will verbalize basic understanding of  DM, CKD Stage 4, Hypertensive Nephropathy, Diabetic mononeuropathy associated with type 2 Diabetes, Vitamin D deficiency, HLD disease process and self health management plan   take all medications exactly as prescribed and will call provider for medication related questions demonstrate Improved health management independence   continue to work with RN Care Manager to address care management and care coordination needs related to  DM, CKD Stage 4, Hypertensive Nephropathy, Diabetic mononeuropathy associated with type 2 Diabetes, Vitamin D deficiency, HLD will demonstrate ongoing self health care management ability  through collaboration with Consulting civil engineer, provider, and care team.   Interventions: 1:1 collaboration with primary care provider regarding development and update of comprehensive plan of care as evidenced by provider attestation and co-signature Inter-disciplinary care team collaboration (see longitudinal plan of care) Evaluation of current treatment plan related to  self management and patient's adherence to plan as established by provider  Hyperlipidemia Interventions: Status: (Condition stable.  Not addressed this visit.) Long Term goal  Medication review performed; medication list updated in electronic medical record.  Review of patient  status, including review of consultant's reports, relevant laboratory and other test results, and medications completed. Provider established cholesterol goals reviewed Provided HLD educational materials Reviewed importance of limiting foods high in cholesterol Reviewed exercise goals and target of 150 minutes per week Assessed social determinant of health barriers  Reviewed and discussed PCP referral for in home PT, patient/wife aware of next steps Printed mailed educational materials related to "The Skinny on Fats"; "Cooking to lower Cholesterol" Lipid Panel     Component Value Date/Time   CHOL 171 12/15/2020 1512   TRIG 66 12/15/2020 1512   HDL 54 12/15/2020 1512   CHOLHDL 3.2 12/15/2020 1512   CHOLHDL 4.0 08/08/2015 0614   VLDL 17 08/08/2015 0614   LDLCALC 104 (H) 12/15/2020 1512   LABVLDL 13 12/15/2020 1512     Vitamin D deficiency Interventions: Status: (Condition stable.  Not addressed this visit.) Long Term Goal  Evaluation of current treatment plan related to  Vitamin D deficiency  , self-management and patient's adherence to plan as established by provider. Provided education to patient about basic disease process for Vitamin D deficiency; educated on role of Vitamin D and potential complications of deficiency Review of patient status, including review of consultant's reports, relevant laboratory and other test results, and medications completed. Provided education to patient re: importance to eat a Vitamin D rich diet, get at least 15 minutes of natural sunlight when possible; Reviewed medications with patient and discussed indication, frequency and dosage of prescribed Vitamin D supplement; Discussed plans with patient for ongoing care management follow up and provided patient with direct contact information for care management team Component Ref Range & Units 1 yr ago  Vit D, 25-Hydroxy 30.0 - 100.0 ng/mL 34.1    Diabetes Interventions: Status: (Condition stable.  Not  addressed this visit.) Long Term Goal  Assessed patient's understanding of A1c goal: <6.5% Provided education to patient about basic DM disease process Reviewed medications with patient and discussed importance of medication adherence Advised patient, providing education and rationale, to check cbg daily before meals and at bedtime and record, calling PCP and or RN CM for findings outside established parameters Review of patient status, including review of consultants reports, relevant laboratory and other test results, and medications completed Determined patient is working with the embedded Pharm D to complete PAP for Ozempic  Lab Results  Component Value Date   HGBA1C 5.8 (H) 12/15/2020      Chronic Kidney Disease Interventions:  (Status:  Goal on track:  Yes.) Long Term Goal Evaluation of current treatment plan related to chronic kidney disease self management and patient's adherence to plan as established by provider      Determined patient completed his follow up with Nephrologist, Dr. Carolin Sicks, on 03/07/21 Review of patient status, including review of consultant's reports, relevant laboratory and other test results, and medications completed Re-educated patient on stages of chronic kidney disease including signs/symptoms that may occur and when to seek medical attention  Determined patient reports he was not given a fluid restriction per Dr. Carolin Sicks Discussed plans with patient for ongoing care management follow up and provided patient with direct contact information for care management team Last practice recorded BP readings:  BP Readings from Last 3 Encounters:  12/15/20 118/60  11/16/20 (!) 115/56  10/06/20 130/78  Most recent eGFR/CrCl:  Lab Results  Component Value Date   EGFR 17 (L) 12/15/2020    No components found for: CRCL    Leg Pain:  (Status:  New goal.)  Short Term Goal Evaluation of current treatment plan related to  leg pain  ,  self-management and patient's  adherence to plan as established by provider Determined patient completed an Urgent [facility] visit at [facility] on 03/12/21 for leg pain Reviewed and discussed the following Assessment/Plan:  The x-ray of your lumbar spine revealed moderate degenerative disc disease, bone spurs and some inflammation of the bones in your lower lumbar spine, lumbar spine vertebrae numbers 4 and 5 as well as in your first sacral spin vertebra.   Because you have significant kidney dysfunction, I am unable to provide you with any oral nonsteroidal anti-inflammatory pain medication such as ibuprofen or naproxen.  Because you are not diabetic, I am unable to prescribe you with any steroid all anti-inflammatory medications.   Therefore, because you have been on pregabalin and tramadol in the past, I recommend that you take these 2 medications for the short-term to relieve your more immediate pain.  I also took the liberty of renewing a prior prescription for Voltaren gel that you can apply 4 times daily as needed for pain relief as well. Please be sure you follow-up with your primary care provider regarding the findings of your x-ray today as soon as possible to discuss referral to orthopedics or spine specialty for further evaluation and treatment. Reviewed medications with patient and discussed importance of medication adherence; diclofenac Sodium (Voltaren) 1% Gel, Apply 4 g topically 4 (four) time daily; tramadol (Ultram) 50 MG, take 1 tablet (50 mg total by mouth every 12 hours as needed for up to 5 days, pregabalin (Lyrica) 25 mg capsule, take 1 capsule (25 mg total by mouth 2 times daily for 15 days Confirmed patient filled these medications and he is taking them as directed Determined patient has a f/u with PCP this afternoon for further evaluation/treatment of his leg pain  Discussed plans with patient for ongoing care management follow up and provided patient with direct contact information for care management team     Patient Goals/Self-Care Activities: Take all medications as prescribed Attend all scheduled provider appointments Call pharmacy for medication refills 3-7 days in advance of running out of medications Perform all self care activities independently  Call provider office for new concerns or questions  Work with embedded Pharm D for Ozempic PAP manage portion size Follow Nephrology recommendations concerning fluid/dietary restrictions   Follow Up Plan:  Telephone follow up appointment with care management team member scheduled for:  06/30/21     Barb Merino, RN, BSN, CCM Care Management Coordinator [facility]  Direct Phone: [phone]

## 2021-03-18 NOTE — Patient Instructions (Signed)
Visit Information ? ?Thank you for taking time to visit with me today. Please don't hesitate to contact me if I can be of assistance to you before our next scheduled telephone appointment. ? ?Following are the goals we discussed today:  ?(Copy and paste patient goals from clinical care plan here) ? ?Our next appointment is by telephone on 06/30/21 at 10:30 AM  ? ?Please call the care guide team at [phone] if you need to cancel or reschedule your appointment.  ? ?If you are experiencing a Mental Health or [place] or need someone to talk to, please call 1-800-273-TALK (toll free, 24 hour hotline)  ? ?Patient verbalizes understanding of instructions and care plan provided today and agrees to view in [place]. Active MyChart status confirmed with patient.   ? ?Barb Merino, RN, BSN, CCM ?Care Management Coordinator ?[place] [facility]  ?Direct Phone: [phone] ? ? ?

## 2021-03-24 ENCOUNTER — Other Ambulatory Visit: Payer: Self-pay

## 2021-03-24 ENCOUNTER — Encounter: Payer: Self-pay | Admitting: Internal Medicine

## 2021-03-24 LAB — URINE CULTURE

## 2021-03-24 MED ORDER — CEPHALEXIN 250 MG PO CAPS: 250.0000 mg | ORAL_CAPSULE | Freq: Two times a day (BID) | ORAL | 0 refills | Status: DC

## 2021-03-29 ENCOUNTER — Other Ambulatory Visit: Payer: Self-pay

## 2021-03-29 MED ORDER — ONETOUCH ULTRA 2 W/DEVICE KIT: PACK | 0 refills | Status: DC

## 2021-03-29 MED ORDER — ONETOUCH DELICA PLUS LANCET33G MISC: 100.0000 | Freq: Three times a day (TID) | 3 refills | Status: DC

## 2021-03-29 MED ORDER — ONETOUCH ULTRA VI STRP: ORAL_STRIP | 3 refills | Status: DC

## 2021-04-02 DIAGNOSIS — E1136 Type 2 diabetes mellitus with diabetic cataract: Secondary | ICD-10-CM | POA: Insufficient documentation

## 2021-04-02 DIAGNOSIS — R300 Dysuria: Secondary | ICD-10-CM | POA: Insufficient documentation

## 2021-04-02 MED ORDER — PREGABALIN 25 MG PO CAPS
25.0000 mg | ORAL_CAPSULE | Freq: Two times a day (BID) | ORAL | 3 refills | Status: DC
Start: 2021-04-02 — End: 2021-06-21

## 2021-04-08 DIAGNOSIS — N184 Chronic kidney disease, stage 4 (severe): Secondary | ICD-10-CM

## 2021-04-08 DIAGNOSIS — E1122 Type 2 diabetes mellitus with diabetic chronic kidney disease: Secondary | ICD-10-CM | POA: Diagnosis not present

## 2021-04-08 DIAGNOSIS — E785 Hyperlipidemia, unspecified: Secondary | ICD-10-CM | POA: Diagnosis not present

## 2021-04-08 DIAGNOSIS — Z794 Long term (current) use of insulin: Secondary | ICD-10-CM

## 2021-04-11 ENCOUNTER — Telehealth: Payer: Self-pay

## 2021-04-11 NOTE — Telephone Encounter (Signed)
err

## 2021-04-12 ENCOUNTER — Telehealth: Payer: Self-pay

## 2021-04-12 NOTE — Telephone Encounter (Signed)
Justin Holloway notified sample of Ozempic is ready for pickup. ?

## 2021-04-18 ENCOUNTER — Telehealth: Payer: Self-pay

## 2021-04-18 NOTE — Chronic Care Management (AMB) (Addendum)
? ? ?  Justin Holloway was reminded to have all medications, supplements and any blood glucose and blood pressure readings available for review with [name], Pharm. D, at his telephone visit on 04-20-2021 at 12:00. ? ?04-20-2021: Patient called to reschedule appointment to 04-26. ? ? ?Malecca Hicks CMA ?Clinical Pharmacist Assistant ?[phone] ? ? ?

## 2021-04-20 ENCOUNTER — Telehealth: Payer: Medicare Other

## 2021-04-20 ENCOUNTER — Telehealth: Payer: Self-pay

## 2021-05-02 ENCOUNTER — Telehealth: Payer: Self-pay

## 2021-05-02 NOTE — Chronic Care Management (AMB) (Signed)
     Justin Holloway was reminded to have all medications, supplements and any blood glucose and blood pressure readings available for review with [name], Pharm. D, at his telephone visit on 05-04-2021 at 3:00.   [place] Pharmacist Assistant [phone]

## 2021-05-04 ENCOUNTER — Ambulatory Visit (INDEPENDENT_AMBULATORY_CARE_PROVIDER_SITE_OTHER): Payer: Medicare Other

## 2021-05-04 DIAGNOSIS — E785 Hyperlipidemia, unspecified: Secondary | ICD-10-CM

## 2021-05-04 DIAGNOSIS — E1122 Type 2 diabetes mellitus with diabetic chronic kidney disease: Secondary | ICD-10-CM

## 2021-05-04 NOTE — Progress Notes (Signed)
? ?Chronic Care Management ?Pharmacy Note ? ?05/13/2021 ?Name:  Justin Holloway MRN:  [id number] DOB:  02-12-1929 ? ?Summary: ?Patient reports that he is doing well.  ? ?Recommendations/Changes made from today's visit: ?Recommend patient Pravastatin be switched to Atorvastatin due to kidney function  ? ?Plan: ?Collaborate with PCP to change patients current cholesterol medication to Atorvastatin.  ? ?Subjective: ?Justin Holloway is an 88 y.o. year old male who is a primary patient of [name], MD.  The CCM team was consulted for assistance with disease management and care coordination needs.   ? ?Engaged with patient by telephone for follow up visit in response to provider referral for pharmacy case management and/or care coordination services.  ? ?Consent to Services:  ?The patient was given information about Chronic Care Management services, agreed to services, and gave verbal consent prior to initiation of services.  Please see initial visit note for detailed documentation.  ? ?Patient Care Team: ?[name], MD as PCP - General (Internal Medicine) ?Marylynn Tyric Rodeheaver, MD as Consulting Physician (Ophthalmology) ?Little, [name], RN as Case Manager ?Mayford Knife, RPH (Pharmacist) ? ?Recent office visits: ?03/17/2020 PCP OV ? ?Recent consult visits: ?11/16/2020 Neurology OV  ? ?Hospital visits: ?03/12/2021 ED OV ? ? ?Objective: ? ?Lab Results  ?Component Value Date  ? CREATININE 3.27 (H) 12/15/2020  ? BUN 51 (H) 12/15/2020  ? EGFR 17 (L) 12/15/2020  ? GFRNONAA 21 (L) 05/15/2020  ? GFRAA 19 (L) 01/14/2020  ? NA 137 12/15/2020  ? K 5.2 12/15/2020  ? CALCIUM 9.2 12/15/2020  ? CO2 24 12/15/2020  ? GLUCOSE 109 (H) 12/15/2020  ? ? ?Lab Results  ?Component Value Date/Time  ? HGBA1C 6.0 (H) 03/17/2021 03:57 PM  ? HGBA1C 5.8 (H) 12/15/2020 03:12 PM  ? MICROALBUR 30 12/15/2020 04:52 PM  ? MICROALBUR 10 06/10/2020 12:38 PM  ?  ?Last diabetic Eye exam: No results found for: HMDIABEYEEXA  ?Last diabetic Foot exam: No  results found for: HMDIABFOOTEX  ? ?Lab Results  ?Component Value Date  ? CHOL 171 12/15/2020  ? HDL 54 12/15/2020  ? LDLCALC 104 (H) 12/15/2020  ? TRIG 66 12/15/2020  ? CHOLHDL 3.2 12/15/2020  ? ? ? ?  Latest Ref Rng & Units 12/15/2020  ?  3:12 PM 01/14/2020  ?  4:59 PM 11/17/2019  ? 12:00 AM  ?Hepatic Function  ?Total Protein 6.0 - 8.5 g/dL 6.9   7.2     ?Albumin 3.6 - 4.6 g/dL 4.4   4.4   4.1       ?AST 0 - 40 IU/L 16   18     ?ALT 0 - 44 IU/L 7   8     ?Alk Phosphatase 44 - 121 IU/L 92   92     ?Total Bilirubin 0.0 - 1.2 mg/dL 0.4   0.4     ?  ? This result is from an external source.  ? ? ?Lab Results  ?Component Value Date/Time  ? TSH 2.620 02/14/2017 10:55 AM  ? ? ? ?  Latest Ref Rng & Units 12/15/2020  ?  3:12 PM 06/09/2020  ?  5:22 PM 05/12/2020  ?  1:42 AM  ?CBC  ?WBC 3.4 - 10.8 x10E3/uL 5.4   7.2   8.5    ?Hemoglobin 13.0 - 17.7 g/dL 11.2   10.3   8.2    ?Hematocrit 37.5 - 51.0 % 34.0   31.6   25.3    ?Platelets 150 - 450  x10E3/uL 223   235   258    ? ? ?Lab Results  ?Component Value Date/Time  ? VD25OH 34.1 09/10/2019 10:29 AM  ? ? ?Clinical ASCVD: Yes  ?The ASCVD Risk score (Arnett DK, et al., 2019) failed to calculate for the following reasons: ?  The 2019 ASCVD risk score is only valid for ages 53 to 69   ? ? ?  08/18/2020  ? 11:24 AM 06/09/2020  ?  3:48 PM 05/28/2019  ? 11:59 AM  ?Depression screen PHQ 2/9  ?Decreased Interest 0 0 0  ?Down, Depressed, Hopeless 0 0 0  ?PHQ - 2 Score 0 0 0  ?Altered sleeping  0 0  ?Tired, decreased energy  0 0  ?Change in appetite  0 0  ?Feeling bad or failure about yourself   0 0  ?Trouble concentrating  0 0  ?Moving slowly or fidgety/restless  0 0  ?Suicidal thoughts   0  ?PHQ-9 Score  0 0  ?Difficult doing work/chores   Not difficult at all  ?  ? ?Social History  ? ?Tobacco Use  ?Smoking Status Former  ? Types: Cigars  ?Smokeless Tobacco Never  ?Tobacco Comments  ? socially, not long  ? ?BP Readings from Last 3 Encounters:  ?03/17/21 130/70  ?03/12/21 105/63  ?12/15/20 118/60   ? ?Pulse Readings from Last 3 Encounters:  ?03/17/21 91  ?03/12/21 99  ?12/15/20 90  ? ?Wt Readings from Last 3 Encounters:  ?03/17/21 202 lb (91.6 kg)  ?12/15/20 202 lb 3.2 oz (91.7 kg)  ?11/16/20 205 lb (93 kg)  ? ?BMI Readings from Last 3 Encounters:  ?03/17/21 33.20 kg/m?  ?12/15/20 33.24 kg/m?  ?11/16/20 34.11 kg/m?  ? ? ?Assessment/Interventions: Review of patient past medical history, allergies, medications, health status, including review of consultants reports, laboratory and other test data, was performed as part of comprehensive evaluation and provision of chronic care management services.  ? ?SDOH:  (Social Determinants of Health) assessments and interventions performed: No ? ?SDOH Screenings  ? ?Alcohol Screen: Not on file  ?Depression (PHQ2-9): Low Risk   ? PHQ-2 Score: 0  ?Financial Resource Strain: Low Risk   ? Difficulty of Paying Living Expenses: Not hard at all  ?Food Insecurity: No Food Insecurity  ? Worried About Charity fundraiser in the Last Year: Never true  ? Ran Out of Food in the Last Year: Never true  ?Housing: Not on file  ?Physical Activity: Insufficiently Active  ? Days of Exercise per Week: 3 days  ? Minutes of Exercise per Session: 20 min  ?Social Connections: Not on file  ?Stress: No Stress Concern Present  ? Feeling of Stress : Not at all  ?Tobacco Use: Medium Risk  ? Smoking Tobacco Use: Former  ? Smokeless Tobacco Use: Never  ? Passive Exposure: Not on file  ?Transportation Needs: No Transportation Needs  ? Lack of Transportation (Medical): No  ? Lack of Transportation (Non-Medical): No  ? ? ?CCM Care Plan ? ?Allergies  ?Allergen Reactions  ? Gabapentin Itching  ? ? ?Medications Reviewed Today   ? ? Reviewed by [name], MD (Physician) on 03/17/21 at 1519  Med List Status: <None>  ? ?Medication Order Taking? Sig Documenting Provider Last Dose Status Informant  ?Ascorbic Acid (VITAMIN C) 1000 MG tablet [id number] Yes Take 1,000 mg by mouth daily.  [provider]  Taking Active Self  ?aspirin EC 81 MG tablet [id number] Yes Take 81 mg by mouth every evening. [provider] Taking Active Self  ?Blood Glucose Monitoring Suppl (ONE TOUCH ULTRA 2) w/Device KIT [id number] Yes Use as directed to check blood sugars 2 times per day dx: e11.22 [name], MD Taking Active   ?cholecalciferol (VITAMIN D) 1000 units tablet [id number] Yes Take 2,000 Units by mouth daily.  [provider] Taking Active Self  ?COMBIGAN 0.2-0.5 % ophthalmic solution [id number] Yes Place 1 drop into both eyes every 12 (twelve) hours. [provider] Taking Active Self  ?diclofenac Sodium (VOLTAREN) 1 % GEL [id number] Yes Apply 4 g topically 4 (four) times daily. Lynden Oxford Scales, PA-C Taking Active   ?DULoxetine (CYMBALTA) 30 MG capsule [id number] Yes Take 1 capsule (30 mg total) by mouth daily. Pieter Partridge, DO Taking Active   ?furosemide (LASIX) 80 MG tablet [id number] Yes TAKE 1 TABLET BY MOUTH IN  THE MORNING AND ONE-HALF  TABLET BY MOUTH IN THE  EARLY EVENING  ?Patient taking differently: Take 40-80 mg by mouth See admin instructions. 80 mg in the morning and 40 mg in the evening.  ? [name], MD Taking Active   ?glucose blood (ONETOUCH ULTRA) test strip [id number] Yes USE STRIPS TO TEST BLOOD  SUGAR 3 TIMES DAILY 1 STRIP PER TEST [name], MD Taking Active   ?hydrALAZINE (APRESOLINE) 10 MG tablet [id number] Yes Take 10 mg by mouth 3 (three) times daily. [provider] Taking Active Self  ?insulin degludec (TRESIBA) 200 UNIT/ML FlexTouch Pen [id number] Yes Inject 24 Units into the skin at bedtime. [name], MD Taking Active   ?Iron-FA-B Cmp-C-Biot-Probiotic (FUSION PLUS) CAPS [id number] Yes Take by mouth. 1 capsule per day [provider] Taking Active   ?Lancets (ONETOUCH DELICA PLUS [id number]) [place] [id number] Yes 100 each by Does not apply route in the morning, at noon, and at bedtime. Minette Brine, FNP Taking Active Self  ?LUMIGAN 0.01 %  SOLN [id number] Yes Place 1 drop into both eyes 2 (two) times daily.  [provider] Taking Active Self  ?Multiple Vitamin (MULTIVITAMIN WITH MINERALS) TABS tablet [id number] Yes Take 1 tablet by mou

## 2021-05-08 DIAGNOSIS — E1122 Type 2 diabetes mellitus with diabetic chronic kidney disease: Secondary | ICD-10-CM

## 2021-05-08 DIAGNOSIS — E785 Hyperlipidemia, unspecified: Secondary | ICD-10-CM | POA: Diagnosis not present

## 2021-05-08 DIAGNOSIS — Z794 Long term (current) use of insulin: Secondary | ICD-10-CM | POA: Diagnosis not present

## 2021-05-08 DIAGNOSIS — N184 Chronic kidney disease, stage 4 (severe): Secondary | ICD-10-CM

## 2021-05-13 NOTE — Telephone Encounter (Signed)
CARE PLAN ENTRY ?(see longitudinal plan of care for additional care plan information) ? ?Current Barriers:  ?Financial Barriers: patient has [employer] and reports copay for medication is cost prohibitive at this time ? ?Pharmacist Clinical Goal(s):  ?Over the next 30 days, patient will work with PharmD and providers to relieve medication access concerns ? ?Interventions: ?Comprehensive medication review completed; medication list updated in electronic medical record.  ? ?Called patient to let them know that their patient assistance medication came in the mail.  ? ?Patient Self Care Activities:  ?Patient to come by office to pick up medication  ? ?Initial goal documentation and Please see past updates related to this goal by clicking on the "Past Updates" button in the selected goal   ?

## 2021-05-13 NOTE — Patient Instructions (Signed)
Visit Information ?It was great speaking with you today!  Please let me know if you have any questions about our visit. ? ? Goals Addressed   ? ?  ?  ?  ?  ? This Visit's Progress  ?  Manage My Medicine     ?  Timeframe:  Long-Range Goal ?Priority:  High ?Start Date:                             ?Expected End Date:                      ? ?Follow Up Date 09/02/2021 ? ?In Progress:  ?  ?- call for medicine refill 2 or 3 days before it runs out ?- keep a list of all the medicines I take; vitamins and herbals too ?- use a pillbox to sort medicine ?- use an alarm clock or phone to remind me to take my medicine  ?  ?Why is this important?   ?These steps will help you keep on track with your medicines. ?  ? ?  ? ?  ? ?Patient Care Plan: [place]  ?  ? ?Problem Identified: HLD, DM II   ?Priority: High  ?  ? ?Long-Range Goal: Disease Management   ?Recent Progress: On track  ?Priority: High  ?Note:   ? ?Current Barriers:  ?Unable to independently monitor therapeutic efficacy ? ?Pharmacist Clinical Goal(s):  ?Patient will achieve adherence to monitoring guidelines and medication adherence to achieve therapeutic efficacy through collaboration with PharmD and provider.  ? ?Interventions: ?1:1 collaboration with Justin Chard, MD regarding development and update of comprehensive plan of care as evidenced by provider attestation and co-signature ?Inter-disciplinary care team collaboration (see longitudinal plan of care) ?Comprehensive medication review performed; medication list updated in electronic medical record ? ?Hyperlipidemia: (LDL goal < 70) ?-Uncontrolled ?-Current treatment: ?Asprin 81 mg tablet once per day Appropriate, Effective, Query Safe ?Pravastatin 40 mg tablet daily  Appropriate, Query effective ?-Current dietary patterns: will  ?-Current exercise habits: Per Ms. Justin Holloway he is exercising every day for 10-15 minutes .  ?-Educated on Cholesterol goals;  ?Benefits of statin for ASCVD risk  reduction; ?-Recommended to continue current medication for now, recommend patients statin medication be changed to Atorvastatin 20 mg tablet once per day ? ?Diabetes (A1c goal <7%) ?-Controlled ?-Current medications: ?Ozempic 0.5 injection weekly Appropriate, Effective, Safe, Accessible ?Tresiba 200 unit/ml - inject 24 units into the skin at bedtime Appropriate, Effective, Safe, Accessible ?-Current home glucose readings: he is checking his BS in the morning  ?fasting glucose: 80-90's, he does not have any readings written down at this time ?-Denies hypoglycemic/hyperglycemic symptoms ?-Current meal patterns:  ?breakfast: oatmeal  ?lunch: sandwich  ?dinner: bake fish and or chicken  ?drinks: he is drinking 4 - 8 ounce cups of water per day  ?-Current exercise: will discuss further during next office visit ?-Educated on A1c and blood sugar goals; ?Prevention and management of hypoglycemic episodes; ?-Counseled to check feet daily and get yearly eye exams ?-Recommended to continue current medication ? ?Patient Goals/Self-Care Activities ?Patient will:  ?- take medications as prescribed as evidenced by patient report and record review ? ?Follow Up Plan: The patient has been provided with contact information for the care management team and has been advised to call with any health related questions or concerns.  ?  ? ? ?Patient agreed to services and verbal consent obtained.  ? ?  The patient verbalized understanding of instructions, educational materials, and care plan provided today and agreed to receive a mailed copy of patient instructions, educational materials, and care plan.  ? ?Justin Holloway, PharmD ?Clinical Pharmacist ?[facility] ?[phone] ?  ?

## 2021-06-04 ENCOUNTER — Other Ambulatory Visit: Payer: Self-pay | Admitting: Internal Medicine

## 2021-06-07 ENCOUNTER — Ambulatory Visit: Payer: Medicare Other | Admitting: Podiatry

## 2021-06-08 ENCOUNTER — Telehealth: Payer: Self-pay

## 2021-06-08 NOTE — Chronic Care Management (AMB) (Cosign Needed)
[employer] patient assistance program notification:  Patient is enrolled in auto-fill, 120- day supply of Novofine tip needles will be filled on 06/25/2021 and should arrive to the office in 10-14 business days. Patient enrollment will expire on 12/08/2021.  Pattricia Boss, [place] Pharmacist Assistant [phone]

## 2021-06-21 ENCOUNTER — Ambulatory Visit (INDEPENDENT_AMBULATORY_CARE_PROVIDER_SITE_OTHER): Payer: Medicare Other | Admitting: Internal Medicine

## 2021-06-21 ENCOUNTER — Telehealth: Payer: Self-pay

## 2021-06-21 ENCOUNTER — Encounter: Payer: Self-pay | Admitting: Internal Medicine

## 2021-06-21 VITALS — BP 122/78 | HR 98 | Temp 98.1°F | Ht 65.4 in | Wt 202.8 lb

## 2021-06-21 DIAGNOSIS — Z6833 Body mass index (BMI) 33.0-33.9, adult: Secondary | ICD-10-CM

## 2021-06-21 DIAGNOSIS — N184 Chronic kidney disease, stage 4 (severe): Secondary | ICD-10-CM | POA: Diagnosis not present

## 2021-06-21 DIAGNOSIS — I129 Hypertensive chronic kidney disease with stage 1 through stage 4 chronic kidney disease, or unspecified chronic kidney disease: Secondary | ICD-10-CM

## 2021-06-21 DIAGNOSIS — N2581 Secondary hyperparathyroidism of renal origin: Secondary | ICD-10-CM

## 2021-06-21 DIAGNOSIS — Z79899 Other long term (current) drug therapy: Secondary | ICD-10-CM | POA: Diagnosis not present

## 2021-06-21 DIAGNOSIS — Z794 Long term (current) use of insulin: Secondary | ICD-10-CM

## 2021-06-21 DIAGNOSIS — R0683 Snoring: Secondary | ICD-10-CM

## 2021-06-21 DIAGNOSIS — E1122 Type 2 diabetes mellitus with diabetic chronic kidney disease: Secondary | ICD-10-CM

## 2021-06-21 DIAGNOSIS — E6609 Other obesity due to excess calories: Secondary | ICD-10-CM

## 2021-06-21 DIAGNOSIS — Z23 Encounter for immunization: Secondary | ICD-10-CM

## 2021-06-21 DIAGNOSIS — R42 Dizziness and giddiness: Secondary | ICD-10-CM

## 2021-06-21 MED ORDER — CYANOCOBALAMIN 1000 MCG/ML IJ SOLN
1000.0000 ug | Freq: Once | INTRAMUSCULAR | Status: AC
  Administered 2021-06-21: 1000 ug via INTRAMUSCULAR

## 2021-06-21 NOTE — Patient Instructions (Signed)

## 2021-06-21 NOTE — Progress Notes (Signed)
Rich Brave [place],acting as a Education administrator for [name], MD.,have documented all relevant documentation on the behalf of [name], MD,as directed by  [name], MD while in the presence of [name], MD.  This visit occurred during the SARS-CoV-2 public health emergency.  Safety protocols were in place, including screening questions prior to the visit, additional usage of staff PPE, and extensive cleaning of exam room while observing appropriate contact time as indicated for disinfecting solutions.  Subjective:     Patient ID: Justin Holloway , male    DOB: June 11, 1929 , 88 y.o.   MRN: [id number]   Chief Complaint  Patient presents with   Diabetes   Hypertension    HPI  Patient is here for dm and bp check. Patient reports compliance with his meds. Patient reports he has been having dizziness and nausea in the mornings. It has been going on for about a week now. He is accompanied by his son today.   Diabetes He presents for his follow-up diabetic visit. He has type 2 diabetes mellitus. His disease course has been stable. Hypoglycemia symptoms include dizziness. Pertinent negatives for diabetes include no blurred vision, no polydipsia, no polyphagia and no polyuria. There are no hypoglycemic complications. Risk factors for coronary artery disease include dyslipidemia, hypertension, diabetes mellitus, male sex, sedentary lifestyle and obesity. He is compliant with treatment most of the time. He is following a generally healthy diet. He participates in exercise intermittently. Eye exam is not current.  Hypertension This is a chronic problem. The current episode started more than 1 year ago. The problem is controlled. Pertinent negatives include no blurred vision, palpitations or shortness of breath. Past treatments include diuretics. The current treatment provides moderate improvement. Hypertensive end-organ damage includes kidney disease.     Past Medical History:   Diagnosis Date   Arthritis    CKD (chronic kidney disease), stage IV (HCC)    Diabetes mellitus without complication ([place])    Glaucoma    "blurred vision", see Dr Gershon Crane yearly   Hyperlipidemia    Hypertension    TIA (transient ischemic attack)      Family History  Problem Relation Age of Onset   Cancer Mother 52       breast   COPD Father 8       Congestive heart failure     Current Outpatient Medications:    Ascorbic Acid (VITAMIN C) 1000 MG tablet, Take 1,000 mg by mouth daily. , Disp: , Rfl:    aspirin EC 81 MG tablet, Take 81 mg by mouth every evening., Disp: , Rfl:    Blood Glucose Monitoring Suppl (ONE TOUCH ULTRA 2) w/Device KIT, Use as directed to check blood sugars 2 times per day dx: e11.22, Disp: 1 kit, Rfl: 0   cholecalciferol (VITAMIN D) 1000 units tablet, Take 2,000 Units by mouth daily. , Disp: , Rfl:    COMBIGAN 0.2-0.5 % ophthalmic solution, Place 1 drop into both eyes every 12 (twelve) hours., Disp: , Rfl:    diclofenac Sodium (VOLTAREN) 1 % GEL, Apply 4 g topically 4 (four) times daily., Disp: 350 g, Rfl: 5   furosemide (LASIX) 80 MG tablet, TAKE 1 TABLET BY MOUTH IN  THE MORNING AND ONE-HALF  TABLET BY MOUTH IN THE  EARLY EVENING (Patient taking differently: Take 40-80 mg by mouth See admin instructions. 80 mg in the morning and 40 mg in the evening.), Disp: 135 tablet, Rfl: 3   glucose blood (ONETOUCH  ULTRA) test strip, USE STRIPS TO TEST BLOOD  SUGAR 3 TIMES DAILY 1 STRIP PER TEST, Disp: 300 strip, Rfl: 3   hydrALAZINE (APRESOLINE) 10 MG tablet, Take 10 mg by mouth 3 (three) times daily., Disp: , Rfl:    insulin degludec (TRESIBA) 200 UNIT/ML FlexTouch Pen, Inject 24 Units into the skin at bedtime., Disp: 3 mL, Rfl: 1   Iron-FA-B Cmp-C-Biot-Probiotic (FUSION PLUS) CAPS, Take by mouth. 1 capsule per day, Disp: , Rfl:    Lancets (ONETOUCH DELICA PLUS [id number]) MISC, 100 each by Does not apply route in the morning, at noon, and at bedtime., Disp: 300 each,  Rfl: 3   LUMIGAN 0.01 % SOLN, Place 1 drop into both eyes 2 (two) times daily. , Disp: , Rfl:    Multiple Vitamin (MULTIVITAMIN WITH MINERALS) TABS tablet, Take 1 tablet by mouth daily., Disp: , Rfl:    polyethylene glycol powder (GLYCOLAX/MIRALAX) 17 GM/SCOOP powder, Take 17 g by mouth daily as needed for mild constipation., Disp: , Rfl:    pravastatin (PRAVACHOL) 40 MG tablet, TAKE 1 TABLET(40 MG) BY MOUTH AT BEDTIME, Disp: 90 tablet, Rfl: 1   Semaglutide,0.25 or 0.5MG/DOS, (OZEMPIC, 0.25 OR 0.5 MG/DOSE,) 2 MG/1.5ML SOPN, Inject 0.5 mg into the skin once a week., Disp: 1.5 mL, Rfl: 1   tamsulosin (FLOMAX) 0.4 MG CAPS capsule, Take 0.4 mg by mouth daily., Disp: , Rfl:    Allergies  Allergen Reactions   Gabapentin Itching     Review of Systems  Constitutional: Negative.   Eyes: Negative.  Negative for blurred vision.  Respiratory: Negative.  Negative for shortness of breath.   Cardiovascular: Negative.  Negative for palpitations.  Gastrointestinal: Negative.   Endocrine: Negative for polydipsia, polyphagia and polyuria.  Skin: Negative.   Neurological:  Positive for dizziness.  Psychiatric/Behavioral: Negative.       Today's Vitals   06/21/21 1405 06/21/21 1410 06/21/21 1414  BP: 118/82 118/70 122/78  Pulse: 87 87 98  Temp: 98.1 F (36.7 C)    Weight: 202 lb 12.8 oz (92 kg)    Height: 5' 5.4" (1.661 m)    PainSc: 0-No pain     Body mass index is 33.34 kg/m.   Objective:  Physical Exam Vitals and nursing note reviewed.  Constitutional:      Appearance: Normal appearance.  HENT:     Head: Normocephalic and atraumatic.  Eyes:     Extraocular Movements: Extraocular movements intact.  Cardiovascular:     Rate and Rhythm: Normal rate and regular rhythm.     Heart sounds: Normal heart sounds.  Pulmonary:     Effort: Pulmonary effort is normal.     Breath sounds: Normal breath sounds.  Musculoskeletal:     Cervical back: Normal range of motion.     Comments: Ambulatory  w/ walker  Skin:    General: Skin is warm.  Neurological:     Mental Status: He is alert and oriented to person, place, and time.     Comments: Shuffling gait  Psychiatric:        Mood and Affect: Mood normal.      Assessment And Plan:     1. Type 2 diabetes mellitus with stage 4 chronic kidney disease, with long-term current use of insulin (HCC) Comments: Chronic, I will check labs as below. Importance of dietary compliance was d/w patient. He was congratulated for avoiding sodas.  - Hemoglobin A1c  2. Hypertensive nephropathy Comments: Chronic, well controlled. He is encouraged to follow low  sodium diet. No need for med adjustments today.   3. Dizziness Comments: Perhaps due to autonomic dysfunction. He is not orthostatic today. He agrees to Neuro evaluation.   4. Snoring Comments: He also c/o nocturia x 4, daytime somnolence and non-restorative sleep. Son adds that wife reports apneic episodes.  - Ambulatory referral to Neurology  5. Secondary renal hyperparathyroidism (HCC) Comments: Chronic, as per Renal.   6. Class 1 obesity due to excess calories with serious comorbidity and body mass index (BMI) of 33.0 to 33.9 in adult Comments: He is encouraged to strive for BMI<30 to decrease cardiac risk. Advised to perform chair exercises while watching TV.   7. Immunization due - Varicella-zoster vaccine IM (Shingrix)  8. Drug therapy - cyanocobalamin ((VITAMIN B-12)) injection 1,000 mcg - Vitamin B12   Patient was given opportunity to ask questions. Patient verbalized understanding of the plan and was able to repeat key elements of the plan. All questions were answered to their satisfaction.   I, [name], MD, have reviewed all documentation for this visit. The documentation on 06/21/21 for the exam, diagnosis, procedures, and orders are all accurate and complete.   IF YOU HAVE BEEN REFERRED TO A SPECIALIST, IT MAY TAKE 1-2 WEEKS TO SCHEDULE/PROCESS THE REFERRAL. IF  YOU HAVE NOT HEARD FROM US/SPECIALIST IN TWO WEEKS, PLEASE GIVE [place] A CALL AT [phone] X [phone].   THE PATIENT IS ENCOURAGED TO PRACTICE SOCIAL DISTANCING DUE TO THE COVID-19 PANDEMIC.

## 2021-06-21 NOTE — Chronic Care Management (AMB) (Signed)
[employer] patient assistance program notification:  120- day supply of Tresiba 200 u/ml and Ozempic 0.25/05 mg will be filled on 07/08/2021 and should arrive to the office in 10-14 business days. Patient enrollment will expire on 12/08/2021.  Pattricia Boss, [place] Pharmacist Assistant [phone]

## 2021-06-22 LAB — HEMOGLOBIN A1C
Est. average glucose Bld gHb Est-mCnc: 126 mg/dL
Hgb A1c MFr Bld: 6 % — ABNORMAL HIGH (ref 4.8–5.6)

## 2021-06-22 LAB — VITAMIN B12: Vitamin B-12: 955 pg/mL (ref 232–1245)

## 2021-06-24 ENCOUNTER — Telehealth: Payer: Self-pay

## 2021-06-24 NOTE — Telephone Encounter (Signed)
Pt called and left a voicemail advising he is having eye surgery and needs to reschedule his appt. He advised he will call back to reschedule.

## 2021-06-27 NOTE — Progress Notes (Deleted)
NEUROLOGY FOLLOW UP OFFICE NOTE  Justin Holloway [id number]  Assessment/Plan:   Diabetic polyneuropathy Gait instability multifactorial due to underlying neuropathy and arthritis.   1  ***     Subjective:  Justin Holloway is an 88 year old male with CKD, DM II, HTN, HLD, glaucoma (early stages) and TIA who follows up for diabetic neuropathy.  UPDATE: Started duloxetine for neuralgia.  ***   HISTORY:  Progressive gait instability since 2016.  He was evaluated by neurology and underwent workup.  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  Repeat brain MRIs from 08/07/2015, 02/07/2016, 01/06/2017, 09/09/2017 and 08/31/2020 personally reviewed were stable.  Labs for neuropathy and muscle weakness weakness on 02/14/2017 showed B12 1,542, TSH 2.620, CK 159, aldolase 4.5, negative Ach R binding antibody, and Hgb A1c 10.6.  Balance problems thought to be related to diabetic polyneuropathy and possibly lumbosacral plexopathy.  Patient reportedly declined MRI of lumbar spine.  Patient also has arthritis of the left knee, as demonstrated on X-ray on 05/10/2020.  Most recent Hgb A1c from 10/06/2020 was 6.7.  He has been to physical therapy and continues home exercises regularly (about every other day).    He has neuropathy, stable over past 2 years.  He does report pain in his feet.  A painful pins and needles sensation on the bottom of his feet, particularly the heels.  It is noticeable at night in bed.  It occurs about 3 nights a week.  He will clean his feet and apply vasoline, which helps soothe them.  He has tried and failed gabapentin and pregablin.  PAST MEDICAL HISTORY: Past Medical History:  Diagnosis Date   Arthritis    CKD (chronic kidney disease), stage IV (HCC)    Diabetes mellitus without complication ([place])    Glaucoma    "blurred vision", see Dr Gershon Crane  yearly   Hyperlipidemia    Hypertension    TIA (transient ischemic attack)     MEDICATIONS: Current Outpatient Medications on File Prior to Visit  Medication Sig Dispense Refill   Ascorbic Acid (VITAMIN C) 1000 MG tablet Take 1,000 mg by mouth daily.      aspirin EC 81 MG tablet Take 81 mg by mouth every evening.     Blood Glucose Monitoring Suppl (ONE TOUCH ULTRA 2) w/Device KIT Use as directed to check blood sugars 2 times per day dx: e11.22 1 kit 0   cholecalciferol (VITAMIN D) 1000 units tablet Take 2,000 Units by mouth daily.      COMBIGAN 0.2-0.5 % ophthalmic solution Place 1 drop into both eyes every 12 (twelve) hours.     diclofenac Sodium (VOLTAREN) 1 % GEL Apply 4 g topically 4 (four) times daily. 350 g 5   furosemide (LASIX) 80 MG tablet TAKE 1 TABLET BY MOUTH IN  THE MORNING AND ONE-HALF  TABLET BY MOUTH IN THE  EARLY EVENING (Patient taking differently: Take 40-80 mg by mouth See admin instructions. 80 mg in the morning and 40 mg in the evening.) 135 tablet 3   glucose blood (ONETOUCH ULTRA) test strip USE STRIPS TO TEST BLOOD  SUGAR 3 TIMES DAILY 1 STRIP PER TEST 300 strip 3   hydrALAZINE (APRESOLINE) 10 MG tablet Take 10 mg by mouth 3 (three) times daily.     insulin degludec (TRESIBA) 200 UNIT/ML FlexTouch Pen Inject 24 Units into the skin at bedtime. 3 mL 1  Iron-FA-B Cmp-C-Biot-Probiotic (FUSION PLUS) CAPS Take by mouth. 1 capsule per day     Lancets (ONETOUCH DELICA PLUS LANCET33G) MISC 100 each by Does not apply route in the morning, at noon, and at bedtime. 300 each 3   LUMIGAN 0.01 % SOLN Place 1 drop into both eyes 2 (two) times daily.      Multiple Vitamin (MULTIVITAMIN WITH MINERALS) TABS tablet Take 1 tablet by mouth daily.     polyethylene glycol powder (GLYCOLAX/MIRALAX) 17 GM/SCOOP powder Take 17 g by mouth daily as needed for mild constipation.     pravastatin (PRAVACHOL) 40 MG tablet TAKE 1 TABLET(40 MG) BY MOUTH AT BEDTIME 90 tablet 1   Semaglutide,0.25 or  0.5MG/DOS, (OZEMPIC, 0.25 OR 0.5 MG/DOSE,) 2 MG/1.5ML SOPN Inject 0.5 mg into the skin once a week. 1.5 mL 1   tamsulosin (FLOMAX) 0.4 MG CAPS capsule Take 0.4 mg by mouth daily.     No current facility-administered medications on file prior to visit.    ALLERGIES: Allergies  Allergen Reactions   Gabapentin Itching    FAMILY HISTORY: Family History  Problem Relation Age of Onset   Cancer Mother 40       breast   COPD Father 89       Congestive heart failure      Objective:  *** General: No acute distress.  Patient appears well-groomed.   Head:  Normocephalic/atraumatic Eyes:  Fundi examined but not visualized Neck: supple, no paraspinal tenderness, full range of motion Heart:  Regular rate and rhythm Lungs:  Clear to auscultation bilaterally Back: No paraspinal tenderness Neurological Exam: alert and oriented to person, place, and time.  Speech fluent and not dysarthric, language intact.  CN II-XII intact. Bulk and tone normal, muscle strength 5/5 throughout.  Sensation to light touch intact.  Deep tendon reflexes 2+ throughout, toes downgoing.  Finger to nose testing intact.  Gait normal, Romberg negative.   Adam Jaffe, DO  CC: Robyn Sanders, MD       

## 2021-06-28 ENCOUNTER — Ambulatory Visit: Payer: Medicare Other | Admitting: Neurology

## 2021-06-30 ENCOUNTER — Telehealth: Payer: Medicare Other

## 2021-06-30 ENCOUNTER — Ambulatory Visit (INDEPENDENT_AMBULATORY_CARE_PROVIDER_SITE_OTHER): Payer: Medicare Other

## 2021-06-30 DIAGNOSIS — E1136 Type 2 diabetes mellitus with diabetic cataract: Secondary | ICD-10-CM

## 2021-06-30 DIAGNOSIS — E1122 Type 2 diabetes mellitus with diabetic chronic kidney disease: Secondary | ICD-10-CM

## 2021-06-30 DIAGNOSIS — I129 Hypertensive chronic kidney disease with stage 1 through stage 4 chronic kidney disease, or unspecified chronic kidney disease: Secondary | ICD-10-CM

## 2021-06-30 DIAGNOSIS — E785 Hyperlipidemia, unspecified: Secondary | ICD-10-CM

## 2021-06-30 DIAGNOSIS — E559 Vitamin D deficiency, unspecified: Secondary | ICD-10-CM

## 2021-06-30 NOTE — Chronic Care Management (AMB) (Signed)
Chronic Care Management   CCM RN Visit Note  06/30/2021 Name: Justin Holloway MRN: [id number] DOB: 12-08-1929  Subjective: Justin Holloway is a 88 y.o. year old male who is a primary care patient of [name], MD. The care management team was consulted for assistance with disease management and care coordination needs.    Engaged with patient by telephone for follow up visit in response to provider referral for case management and/or care coordination services.   Consent to Services:  The patient was given information about Chronic Care Management services, agreed to services, and gave verbal consent prior to initiation of services.  Please see initial visit note for detailed documentation.   Patient agreed to services and verbal consent obtained.   Assessment: Review of patient past medical history, allergies, medications, health status, including review of consultants reports, laboratory and other test data, was performed as part of comprehensive evaluation and provision of chronic care management services.   SDOH (Social Determinants of Health) assessments and interventions performed:  Yes, no acute changes   CCM Care Plan  Allergies  Allergen Reactions   Gabapentin Itching    Outpatient Encounter Medications as of 06/30/2021  Medication Sig   Ascorbic Acid (VITAMIN C) 1000 MG tablet Take 1,000 mg by mouth daily.    aspirin EC 81 MG tablet Take 81 mg by mouth every evening.   Blood Glucose Monitoring Suppl (ONE TOUCH ULTRA 2) w/Device KIT Use as directed to check blood sugars 2 times per day dx: e11.22   cholecalciferol (VITAMIN D) 1000 units tablet Take 2,000 Units by mouth daily.    COMBIGAN 0.2-0.5 % ophthalmic solution Place 1 drop into both eyes every 12 (twelve) hours.   diclofenac Sodium (VOLTAREN) 1 % GEL Apply 4 g topically 4 (four) times daily.   furosemide (LASIX) 80 MG tablet TAKE 1 TABLET BY MOUTH IN  THE MORNING AND ONE-HALF  TABLET BY MOUTH IN THE  EARLY  EVENING (Patient taking differently: Take 40-80 mg by mouth See admin instructions. 80 mg in the morning and 40 mg in the evening.)   glucose blood (ONETOUCH ULTRA) test strip USE STRIPS TO TEST BLOOD  SUGAR 3 TIMES DAILY 1 STRIP PER TEST   hydrALAZINE (APRESOLINE) 10 MG tablet Take 10 mg by mouth 3 (three) times daily.   insulin degludec (TRESIBA) 200 UNIT/ML FlexTouch Pen Inject 24 Units into the skin at bedtime.   Iron-FA-B Cmp-C-Biot-Probiotic (FUSION PLUS) CAPS Take by mouth. 1 capsule per day   Lancets (ONETOUCH DELICA PLUS LDJTTS17B) MISC 100 each by Does not apply route in the morning, at noon, and at bedtime.   LUMIGAN 0.01 % SOLN Place 1 drop into both eyes 2 (two) times daily.    Multiple Vitamin (MULTIVITAMIN WITH MINERALS) TABS tablet Take 1 tablet by mouth daily.   polyethylene glycol powder (GLYCOLAX/MIRALAX) 17 GM/SCOOP powder Take 17 g by mouth daily as needed for mild constipation.   pravastatin (PRAVACHOL) 40 MG tablet TAKE 1 TABLET(40 MG) BY MOUTH AT BEDTIME   Semaglutide,0.25 or 0.5MG/DOS, (OZEMPIC, 0.25 OR 0.5 MG/DOSE,) 2 MG/1.5ML SOPN Inject 0.5 mg into the skin once a week.   tamsulosin (FLOMAX) 0.4 MG CAPS capsule Take 0.4 mg by mouth daily.   No facility-administered encounter medications on file as of 06/30/2021.    Patient Active Problem List   Diagnosis Date Noted   Controlled type 2 diabetes mellitus with cataract ([place]) 04/02/2021   Dysuria 04/02/2021   Atherosclerosis of aorta ([place]) 08/22/2020  Shuffling gait 08/22/2020   Generalized weakness 05/10/2020   Failure to thrive in adult 05/10/2020   Chronic pain 05/10/2020   Constipation 05/10/2020   Class 1 obesity due to excess calories with serious comorbidity and body mass index (BMI) of 33.0 to 33.9 in adult 05/10/2020   DNR (do not resuscitate) 05/10/2020   Hypertension    Hyperlipidemia    Glaucoma    CKD (chronic kidney disease), stage IV ([place])    Secondary renal hyperparathyroidism ([place]) 01/14/2020    Type 2 diabetes mellitus with stage 4 chronic kidney disease, with long-term current use of insulin ([place]) 09/10/2019   Peripheral vascular disease, unspecified ([place]) 05/28/2019   Diabetic neuropathy ([place]) 02/18/2019   Left upper quadrant pain 09/04/2018   Acute midline low back pain without sciatica 09/04/2018   CKD (chronic kidney disease) stage 4, GFR 15-29 ml/min (HCC) 11/21/2017   Hypertensive nephropathy 11/21/2017   Right foot pain 11/21/2017   Vertigo 09/09/2017   BPPV (benign paroxysmal positional vertigo), right 09/06/2017   Sensorineural hearing loss (SNHL), bilateral 09/06/2017   Ataxia 02/06/2017   UTI (lower urinary tract infection) 08/07/2015   Dizziness 08/07/2015   Acute renal failure superimposed on stage 3 chronic kidney disease ([place]) 08/07/2015   Hyperkalemia 08/07/2015   Left inguinal hernia 05/15/2013   Diabetes mellitus without complication ([place]) 89/21/1941   HLD (hyperlipidemia) 03/08/2006   HYPERTENSION, BENIGN SYSTEMIC 03/08/2006   IMPOTENCE, 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   PROTEINURIA 03/08/2006    Conditions to be addressed/monitored: DM, CKD Stage 4, Hypertensive Nephropathy, Diabetic mononeuropathy associated with type 2 Diabetes, Vitamin D deficiency, HLD  Care Plan : RN Care Manager Plan of Care  Updates made by Lynne Logan, RN since 06/30/2021 12:00 AM     Problem: No plan of care established for management of chronic disease states (DM, CKD Stage 4, Hypertensive Nephropathy, Diabetic mononeuropathy associated with type 2 Diabetes, Vitamin D deficiency, HLD)   Priority: High     Long-Range Goal: Establish plan of care for management of chronic disease states (DM, CKD Stage 4, Hypertensive Nephropathy, Diabetic mononeuropathy associated with type 2 Diabetes, Vitamin D deficiency, HLD)   Start Date: 11/17/2020  Expected End Date: 11/17/2021  Recent Progress: On track  Priority: High  Note:   Current Barriers:  Knowledge Deficits related to plan of care  for management of DM, CKD Stage 4, Hypertensive Nephropathy, Diabetic mononeuropathy associated with type 2 Diabetes, Vitamin D deficiency, HLD Chronic Disease Management support and education needs related to DM, CKD Stage 4, Hypertensive Nephropathy, Diabetic mononeuropathy associated with type 2 Diabetes, Vitamin D deficiency, HLD  RNCM Clinical Goal(s):  Patient will verbalize basic understanding of  DM, CKD Stage 4, Hypertensive Nephropathy, Diabetic mononeuropathy associated with type 2 Diabetes, Vitamin D deficiency, HLD disease process and self health management plan   take all medications exactly as prescribed and will call provider for medication related questions demonstrate Improved health management independence   continue to work with RN Care Manager to address care management and care coordination needs related to  DM, CKD Stage 4, Hypertensive Nephropathy, Diabetic mononeuropathy associated with type 2 Diabetes, Vitamin D deficiency, HLD will demonstrate ongoing self health care management ability    through collaboration with RN Care manager, provider, and care team.   Interventions: 1:1 collaboration with primary care provider regarding development and update of comprehensive plan of care as evidenced by provider attestation and co-signature Inter-disciplinary care team collaboration (see longitudinal plan of care) Evaluation of current treatment plan related  to  self management and patient's adherence to plan as established by provider  Hyperlipidemia Interventions: Status: (Condition stable.  Not addressed this visit.) Long Term goal  Medication review performed; medication list updated in electronic medical record.  Review of patient status, including review of consultant's reports, relevant laboratory and other test results, and medications completed. Provider established cholesterol goals reviewed Provided HLD educational materials Reviewed importance of limiting foods high  in cholesterol Reviewed exercise goals and target of 150 minutes per week Assessed social determinant of health barriers  Reviewed and discussed PCP referral for in home PT, patient/wife aware of next steps Printed mailed educational materials related to "The Skinny on Fats"; "Cooking to lower Cholesterol" Lipid Panel     Component Value Date/Time   CHOL 171 12/15/2020 1512   TRIG 66 12/15/2020 1512   HDL 54 12/15/2020 1512   CHOLHDL 3.2 12/15/2020 1512   CHOLHDL 4.0 08/08/2015 0614   VLDL 17 08/08/2015 0614   LDLCALC 104 (H) 12/15/2020 1512   LABVLDL 13 12/15/2020 1512     Vitamin D deficiency Interventions: Status: (Condition stable.  Not addressed this visit.) Long Term Goal  Evaluation of current treatment plan related to  Vitamin D deficiency  , self-management and patient's adherence to plan as established by provider. Provided education to patient about basic disease process for Vitamin D deficiency; educated on role of Vitamin D and potential complications of deficiency Review of patient status, including review of consultant's reports, relevant laboratory and other test results, and medications completed. Provided education to patient re: importance to eat a Vitamin D rich diet, get at least 15 minutes of natural sunlight when possible; Reviewed medications with patient and discussed indication, frequency and dosage of prescribed Vitamin D supplement; Discussed plans with patient for ongoing care management follow up and provided patient with direct contact information for care management team Component Ref Range & Units 1 yr ago  Vit D, 25-Hydroxy 30.0 - 100.0 ng/mL 34.1   Diabetes Interventions:  (Status:  Goal on track:  Yes.) Long Term Goal Assessed patient's understanding of A1c goal:  <5.7 %  Provided education to patient about basic DM disease process Reviewed medications with patient and discussed importance of medication adherence Review of patient status, including  review of consultants reports, relevant laboratory and other test results, and medications completed Determined patient's financial assistance with [facility] Aas and Ozempic will expire on 12/08/21, reviewed upcoming telephone appointment with the embedded pharmacy team scheduled for 09/02/21 _0  AM  Encouraged patient to continue to follow dietary recommendations  Lab Results  Component Value Date   HGBA1C 6.0 (H) 06/21/2021   Chronic Kidney Disease Interventions:  (Status:  Goal on track:  Yes.) Long Term Goal Assessed the Patient understanding of chronic kidney disease    Evaluation of current treatment plan related to chronic kidney disease self management and patient's adherence to plan as established by provider      Advised patient to discuss new symptoms or concerns with provider    Assessed social determinant of health barriers    Provided education on kidney disease progression    Engage patient in early, proactive and ongoing discussion about goals of care and what matters most to them    Continue to drink around 32 oz of water daily as directed by your kidney doctor  Last practice recorded BP readings:  BP Readings from Last 3 Encounters:  06/21/21 122/78  03/17/21 130/70  03/12/21 105/63  Most recent eGFR/CrCl:  Lab Results  Component Value Date   EGFR 17 (L) 12/15/2020    No components found for: "CRCL"   Leg Pain Interventions:  (Status:  Condition stable.  Not addressed this visit.)  Short Term Goal Evaluation of current treatment plan related to  leg pain  ,  self-management and patient's adherence to plan as established by provider Determined patient completed an Urgent Care visit at [facility] on 03/12/21 for leg pain Reviewed and discussed the following Assessment/Plan:  The x-ray of your lumbar spine revealed moderate degenerative disc disease, bone spurs and some inflammation of the bones in your lower lumbar spine, lumbar spine vertebrae numbers 4 and 5 as well as in your  first sacral spin vertebra.   Because you have significant kidney dysfunction, I am unable to provide you with any oral nonsteroidal anti-inflammatory pain medication such as ibuprofen or naproxen.  Because you are not diabetic, I am unable to prescribe you with any steroid all anti-inflammatory medications.   Therefore, because you have been on pregabalin and tramadol in the past, I recommend that you take these 2 medications for the short-term to relieve your more immediate pain.  I also took the liberty of renewing a prior prescription for Voltaren gel that you can apply 4 times daily as needed for pain relief as well. Please be sure you follow-up with your primary care provider regarding the findings of your x-ray today as soon as possible to discuss referral to orthopedics or spine specialty for further evaluation and treatment. Reviewed medications with patient and discussed importance of medication adherence; diclofenac Sodium (Voltaren) 1% Gel, Apply 4 g topically 4 (four) time daily; tramadol (Ultram) 50 MG, take 1 tablet (50 mg total by mouth every 12 hours as needed for up to 5 days, pregabalin (Lyrica) 25 mg capsule, take 1 capsule (25 mg total by mouth 2 times daily for 15 days Confirmed patient filled these medications and he is taking them as directed Determined patient has a f/u with PCP this afternoon for further evaluation/treatment of his leg pain  Discussed plans with patient for ongoing care management follow up and provided patient with direct contact information for care management team    Patient Goals/Self-Care Activities: Take all medications as prescribed Attend all scheduled provider appointments Call pharmacy for medication refills 3-7 days in advance of running out of medications Perform all self care activities independently  Call provider office for new concerns or questions  Work with embedded Pharm D for Ozempic PAP manage portion size Follow Nephrology  recommendations concerning fluid/dietary restrictions   Follow Up Plan:  Telephone follow up appointment with care management team member scheduled for:  09/23/21     Barb Merino, RN, BSN, CCM Care Management Coordinator [place] [facility]  Direct Phone: [phone]

## 2021-06-30 NOTE — Patient Instructions (Signed)
Visit Information  Thank you for taking time to visit with me today. Please don't hesitate to contact me if I can be of assistance to you before our next scheduled telephone appointment.  Following are the goals we discussed today:  Take all medications as prescribed Attend all scheduled provider appointments Call pharmacy for medication refills 3-7 days in advance of running out of medications Perform all self care activities independently  Call provider office for new concerns or questions  Work with embedded Pharm D for Ozempic PAP manage portion size Follow Nephrology recommendations concerning fluid/dietary restrictions   Our next appointment is by telephone on 09/23/21 at 12 PM   Please call the care guide team at [phone] if you need to cancel or reschedule your appointment.   If you are experiencing a Mental Health or [place] or need someone to talk to, please call 1-800-273-TALK (toll free, 24 hour hotline)   Patient verbalizes understanding of instructions and care plan provided today and agrees to view in [facility]. Active MyChart status and patient understanding of how to access instructions and care plan via MyChart confirmed with patient.     Barb Merino, RN, BSN, CCM Care Management Coordinator [place] [facility]  Direct Phone: [phone]

## 2021-07-08 DIAGNOSIS — N184 Chronic kidney disease, stage 4 (severe): Secondary | ICD-10-CM | POA: Diagnosis not present

## 2021-07-08 DIAGNOSIS — E785 Hyperlipidemia, unspecified: Secondary | ICD-10-CM | POA: Diagnosis not present

## 2021-07-08 DIAGNOSIS — Z794 Long term (current) use of insulin: Secondary | ICD-10-CM

## 2021-07-08 DIAGNOSIS — E1122 Type 2 diabetes mellitus with diabetic chronic kidney disease: Secondary | ICD-10-CM | POA: Diagnosis not present

## 2021-07-11 ENCOUNTER — Telehealth: Payer: Self-pay

## 2021-07-11 NOTE — Chronic Care Management (AMB) (Signed)
[employer] patient assistance program notification:  120- day supply of Novofine needles was shipped on 06/28/2021 and should arrive to the office in 10-14 business days. Patient has 1  refill remaining and enrollment will expire on 12/08/2021.  The next refill for patient will be fulfilled on 09/14/2021.  Pattricia Boss, [place] Pharmacist Assistant [phone]

## 2021-07-13 ENCOUNTER — Telehealth: Payer: Self-pay

## 2021-07-13 NOTE — Chronic Care Management (AMB) (Signed)
Chronic Care Management Pharmacy Assistant   Name: Justin Holloway  MRN: [id number] DOB: Nov 26, 1929  Reason for Encounter: Disease State/ Diabetes  Recent office visits:  06-30-2021 Little, [name], RN (CCM).  06-21-2021 [name], MD. A1C= 6.0. Referral placed to neurology. STOP cymbalta and lyrica. B12 given.  Recent consult visits:  None  Hospital visits:  Medication Reconciliation was completed by comparing discharge summary, patient's EMR and Pharmacy list, and upon discussion with patient.  Admitted to the hospital on 03-12-2021 due to burning with urination. Discharge date was 03-12-2021. Discharged from [facility].  New?Medications Started at [facility] Discharge:?? Tramadol 50 mg every 12 hours as needed  Medication Changes at Hospital Discharge: INCREASE Lyrica 25 mg daily TO twice daily.   Medications Discontinued at Hospital Discharge: None  Medications that remain the same after Hospital Discharge:??  -All other medications will remain the same.    Medications: Outpatient Encounter Medications as of 07/13/2021  Medication Sig   Ascorbic Acid (VITAMIN C) 1000 MG tablet Take 1,000 mg by mouth daily.    aspirin EC 81 MG tablet Take 81 mg by mouth every evening.   Blood Glucose Monitoring Suppl (ONE TOUCH ULTRA 2) w/Device KIT Use as directed to check blood sugars 2 times per day dx: e11.22   cholecalciferol (VITAMIN D) 1000 units tablet Take 2,000 Units by mouth daily.    COMBIGAN 0.2-0.5 % ophthalmic solution Place 1 drop into both eyes every 12 (twelve) hours.   diclofenac Sodium (VOLTAREN) 1 % GEL Apply 4 g topically 4 (four) times daily.   furosemide (LASIX) 80 MG tablet TAKE 1 TABLET BY MOUTH IN  THE MORNING AND ONE-HALF  TABLET BY MOUTH IN THE  EARLY EVENING (Patient taking differently: Take 40-80 mg by mouth See admin instructions. 80 mg in the morning and 40 mg in the evening.)   glucose blood (ONETOUCH ULTRA) test strip USE STRIPS TO  TEST BLOOD  SUGAR 3 TIMES DAILY 1 STRIP PER TEST   hydrALAZINE (APRESOLINE) 10 MG tablet Take 10 mg by mouth 3 (three) times daily.   insulin degludec (TRESIBA) 200 UNIT/ML FlexTouch Pen Inject 24 Units into the skin at bedtime.   Iron-FA-B Cmp-C-Biot-Probiotic (FUSION PLUS) CAPS Take by mouth. 1 capsule per day   Lancets (ONETOUCH DELICA PLUS [id number]) MISC 100 each by Does not apply route in the morning, at noon, and at bedtime.   LUMIGAN 0.01 % SOLN Place 1 drop into both eyes 2 (two) times daily.    Multiple Vitamin (MULTIVITAMIN WITH MINERALS) TABS tablet Take 1 tablet by mouth daily.   polyethylene glycol powder (GLYCOLAX/MIRALAX) 17 GM/SCOOP powder Take 17 g by mouth daily as needed for mild constipation.   pravastatin (PRAVACHOL) 40 MG tablet TAKE 1 TABLET(40 MG) BY MOUTH AT BEDTIME   Semaglutide,0.25 or 0.5MG/DOS, (OZEMPIC, 0.25 OR 0.5 MG/DOSE,) 2 MG/1.5ML SOPN Inject 0.5 mg into the skin once a week.   tamsulosin (FLOMAX) 0.4 MG CAPS capsule Take 0.4 mg by mouth daily.   No facility-administered encounter medications on file as of 07/13/2021.  Recent Relevant Labs: Lab Results  Component Value Date/Time   HGBA1C 6.0 (H) 06/21/2021 02:40 PM   HGBA1C 6.0 (H) 03/17/2021 03:57 PM   MICROALBUR 30 12/15/2020 04:52 PM   MICROALBUR 10 06/10/2020 12:38 PM    Kidney Function Lab Results  Component Value Date/Time   CREATININE 3.27 (H) 12/15/2020 03:12 PM   CREATININE 3.06 (H) 06/09/2020 05:22 PM   GFRNONAA 21 (L)  05/15/2020 02:01 AM   GFRAA 19 (L) 01/14/2020 04:59 PM    Current antihyperglycemic regimen:  Ozempic 0.5 mg weekly (Patient reports having 3 weeks worth left of medication) Tresiba 24 units nightly  What recent interventions/DTPs have been made to improve glycemic control:  Educated on A1c and blood sugar goals; Prevention and management of hypoglycemic episodes; -Counseled to check feet daily and get yearly eye exams -Recommended to continue current medication  Have  there been any recent hospitalizations or ED visits since last visit with CPP? No  Patient denies hypoglycemic symptoms  Patient denies hyperglycemic symptoms  How often are you checking your blood sugar? once daily  What are your blood sugars ranging?  Fasting: 86, 92, 112 Before meals: None After meals: None Bedtime: None  During the week, how often does your blood glucose drop below 70? Never  Are you checking your feet daily/regularly? Daily  Adherence Review: Is the patient currently on a STATIN medication? Yes Is the patient currently on ACE/ARB medication? No Does the patient have >5 day gap between last estimated fill dates? No  Care Gaps: Covid booster overdue  Star Rating Drugs: Pravastatin 40 mg- Last filled 06-05-2021 90 DS [facility] Ozempic 0.5 mg- Patient assistance    [place] Pharmacist Assistant [phone]

## 2021-07-20 ENCOUNTER — Telehealth: Payer: Self-pay

## 2021-07-20 NOTE — Chronic Care Management (AMB) (Signed)
[employer] patient assistance program notification:  120- day supply of Ozempic 0.25/0.5 mg and Tresiba 200 u/ml was filled on 07/13/2021 and  should arrive to the office in 10-14 business days. Patient has 1  refill remaining and enrollment will expire on 12/08/2021.  The next refill for patient will be fulfilled on 09/29/2021.  Pattricia Boss, [place] Pharmacist Assistant [phone]

## 2021-08-04 ENCOUNTER — Institutional Professional Consult (permissible substitution): Payer: Medicare Other | Admitting: Neurology

## 2021-08-15 ENCOUNTER — Ambulatory Visit: Payer: Medicare Other | Admitting: Internal Medicine

## 2021-08-22 ENCOUNTER — Ambulatory Visit: Payer: Medicare Other | Admitting: Family Medicine

## 2021-08-23 ENCOUNTER — Ambulatory Visit: Payer: Medicare Other | Admitting: Internal Medicine

## 2021-08-30 NOTE — Progress Notes (Signed)
This encounter was created in error - please disregard.

## 2021-09-01 ENCOUNTER — Ambulatory Visit (INDEPENDENT_AMBULATORY_CARE_PROVIDER_SITE_OTHER): Payer: Medicare Other | Admitting: Internal Medicine

## 2021-09-01 ENCOUNTER — Encounter: Payer: Self-pay | Admitting: Internal Medicine

## 2021-09-01 VITALS — BP 128/64 | HR 89 | Temp 98.0°F | Ht 65.0 in | Wt 194.6 lb

## 2021-09-01 DIAGNOSIS — I129 Hypertensive chronic kidney disease with stage 1 through stage 4 chronic kidney disease, or unspecified chronic kidney disease: Secondary | ICD-10-CM

## 2021-09-01 DIAGNOSIS — N184 Chronic kidney disease, stage 4 (severe): Secondary | ICD-10-CM | POA: Diagnosis not present

## 2021-09-01 DIAGNOSIS — R55 Syncope and collapse: Secondary | ICD-10-CM

## 2021-09-01 DIAGNOSIS — E1122 Type 2 diabetes mellitus with diabetic chronic kidney disease: Secondary | ICD-10-CM

## 2021-09-01 DIAGNOSIS — Z794 Long term (current) use of insulin: Secondary | ICD-10-CM

## 2021-09-01 DIAGNOSIS — E559 Vitamin D deficiency, unspecified: Secondary | ICD-10-CM

## 2021-09-01 DIAGNOSIS — E785 Hyperlipidemia, unspecified: Secondary | ICD-10-CM

## 2021-09-01 NOTE — Patient Instructions (Signed)

## 2021-09-01 NOTE — Progress Notes (Signed)
Barnet Glasgow Martin,acting as a Education administrator for [name], MD.,have documented all relevant documentation on the behalf of [name], MD,as directed by  [name], MD while in the presence of [name], MD.    Subjective:     Patient ID: Justin Holloway , male    DOB: 1929-09-13 , 88 y.o.   MRN: [id number]   Chief Complaint  Patient presents with   Diabetes    HPI  Patient presents today for a DM check, Patient is compliance with all medication. Patient has not been checking his sugars, because he doesn't know how to use his machine. Last a1c was 6.0 in June 2023.   BP Readings from Last 3 Encounters: 09/01/21 : 128/64 06/21/21 : 122/78 03/17/21 : 130/70    Diabetes He presents for his follow-up diabetic visit. He has type 2 diabetes mellitus. His disease course has been stable. There are no hypoglycemic associated symptoms. Pertinent negatives for diabetes include no blurred vision. There are no hypoglycemic complications. Risk factors for coronary artery disease include dyslipidemia, hypertension, diabetes mellitus, male sex, sedentary lifestyle and obesity. He is compliant with treatment most of the time. He is following a generally healthy diet. He participates in exercise intermittently. Eye exam is not current.  Hypertension This is a chronic problem. The current episode started more than 1 year ago. The problem is controlled. Pertinent negatives include no blurred vision, palpitations or shortness of breath. Past treatments include diuretics. The current treatment provides moderate improvement. Hypertensive end-organ damage includes kidney disease.     Past Medical History:  Diagnosis Date   Arthritis    CKD (chronic kidney disease), stage IV (HCC)    Diabetes mellitus without complication ([place])    Glaucoma    "blurred vision", see Dr Gershon Crane yearly   Hyperlipidemia    Hypertension    TIA (transient ischemic attack)      Family History  Problem  Relation Age of Onset   Cancer Mother 83       breast   COPD Father 27       Congestive heart failure     Current Outpatient Medications:    Ascorbic Acid (VITAMIN C) 1000 MG tablet, Take 1,000 mg by mouth daily. , Disp: , Rfl:    aspirin EC 81 MG tablet, Take 81 mg by mouth every evening., Disp: , Rfl:    Blood Glucose Monitoring Suppl (ONE TOUCH ULTRA 2) w/Device KIT, Use as directed to check blood sugars 2 times per day dx: e11.22, Disp: 1 kit, Rfl: 0   cholecalciferol (VITAMIN D) 1000 units tablet, Take 2,000 Units by mouth daily. , Disp: , Rfl:    COMBIGAN 0.2-0.5 % ophthalmic solution, Place 1 drop into both eyes every 12 (twelve) hours., Disp: , Rfl:    diclofenac Sodium (VOLTAREN) 1 % GEL, Apply 4 g topically 4 (four) times daily., Disp: 350 g, Rfl: 5   furosemide (LASIX) 80 MG tablet, TAKE 1 TABLET BY MOUTH IN  THE MORNING AND ONE-HALF  TABLET BY MOUTH IN THE  EARLY EVENING (Patient taking differently: Take 40-80 mg by mouth See admin instructions. 80 mg in the morning and 40 mg in the evening.), Disp: 135 tablet, Rfl: 3   glucose blood (ONETOUCH ULTRA) test strip, USE STRIPS TO TEST BLOOD  SUGAR 3 TIMES DAILY 1 STRIP PER TEST, Disp: 300 strip, Rfl: 3   hydrALAZINE (APRESOLINE) 10 MG tablet, Take 10 mg by mouth 3 (three) times daily., Disp: ,  Rfl:    insulin degludec (TRESIBA) 200 UNIT/ML FlexTouch Pen, Inject 24 Units into the skin at bedtime., Disp: 3 mL, Rfl: 1   Iron-FA-B Cmp-C-Biot-Probiotic (FUSION PLUS) CAPS, Take by mouth. 1 capsule per day, Disp: , Rfl:    Lancets (ONETOUCH DELICA PLUS [id number]) MISC, 100 each by Does not apply route in the morning, at noon, and at bedtime., Disp: 300 each, Rfl: 3   LUMIGAN 0.01 % SOLN, Place 1 drop into both eyes 2 (two) times daily. , Disp: , Rfl:    Multiple Vitamin (MULTIVITAMIN WITH MINERALS) TABS tablet, Take 1 tablet by mouth daily., Disp: , Rfl:    polyethylene glycol powder (GLYCOLAX/MIRALAX) 17 GM/SCOOP powder, Take 17 g by mouth  daily as needed for mild constipation., Disp: , Rfl:    pravastatin (PRAVACHOL) 40 MG tablet, TAKE 1 TABLET(40 MG) BY MOUTH AT BEDTIME, Disp: 90 tablet, Rfl: 1   Semaglutide,0.25 or 0.5MG/DOS, (OZEMPIC, 0.25 OR 0.5 MG/DOSE,) 2 MG/1.5ML SOPN, Inject 0.5 mg into the skin once a week., Disp: 1.5 mL, Rfl: 1   tamsulosin (FLOMAX) 0.4 MG CAPS capsule, Take 0.4 mg by mouth daily., Disp: , Rfl:    Accu-Chek Softclix Lancets lancets, Use as instructed to check blood sugars twice daily E11.69, Disp: 100 each, Rfl: 12   glucose blood (ACCU-CHEK GUIDE) test strip, Use as instructed to check blood sugars twice daily E11.69, Disp: 100 each, Rfl: 12   Allergies  Allergen Reactions   Gabapentin Itching     Review of Systems  Constitutional: Negative.   HENT: Negative.    Eyes: Negative.  Negative for blurred vision.  Respiratory: Negative.  Negative for shortness of breath.   Cardiovascular: Negative.  Negative for palpitations.  Gastrointestinal: Negative.      Today's Vitals   09/01/21 1539  BP: 128/64  Pulse: 89  Temp: 98 F (36.7 C)  TempSrc: Oral  Weight: 194 lb 9.6 oz (88.3 kg)  Height: _0  (1.651 m)  PainSc: 4   PainLoc: Knee   Body mass index is 32.38 kg/m.  Wt Readings from Last 3 Encounters:  09/07/21 207 lb (93.9 kg)  09/01/21 194 lb 9.6 oz (88.3 kg)  06/21/21 202 lb 12.8 oz (92 kg)    Objective:  Physical Exam Vitals and nursing note reviewed.  Constitutional:      Appearance: Normal appearance.  HENT:     Head: Normocephalic and atraumatic.  Eyes:     Extraocular Movements: Extraocular movements intact.  Cardiovascular:     Rate and Rhythm: Normal rate and regular rhythm.     Heart sounds: Normal heart sounds.  Pulmonary:     Effort: Pulmonary effort is normal.     Breath sounds: Normal breath sounds.  Musculoskeletal:     Cervical back: Normal range of motion.     Comments: Shuffling gait  Skin:    General: Skin is warm.  Neurological:     General: No  focal deficit present.     Mental Status: He is alert.  Psychiatric:        Mood and Affect: Mood normal.       Assessment And Plan:     1. Type 2 diabetes mellitus with stage 4 chronic kidney disease, with long-term current use of insulin (HCC) Comments: He was given a new meter, advised it is too early to check a1c. He agrees to rto in 2 weeks for lab visit. He is reminded to avoid NSAIDs.  F/u in 4 months. - Hemoglobin  A1c; Future - BMP8+eGFR; Future  2. Hypertensive nephropathy Comments: Chronic, well controlled. He is encouraged to follow low sodium diet.      Patient was given opportunity to ask questions. Patient verbalized understanding of the plan and was able to repeat key elements of the plan. All questions were answered to their satisfaction.   I, [name], MD, have reviewed all documentation for this visit. The documentation on 09/01/21 for the exam, diagnosis, procedures, and orders are all accurate and complete.   IF YOU HAVE BEEN REFERRED TO A SPECIALIST, IT MAY TAKE 1-2 WEEKS TO SCHEDULE/PROCESS THE REFERRAL. IF YOU HAVE NOT HEARD FROM US/SPECIALIST IN TWO WEEKS, PLEASE GIVE [place] A CALL AT [phone] X [phone].   THE PATIENT IS ENCOURAGED TO PRACTICE SOCIAL DISTANCING DUE TO THE COVID-19 PANDEMIC.

## 2021-09-02 ENCOUNTER — Telehealth: Payer: Medicare Other

## 2021-09-05 ENCOUNTER — Other Ambulatory Visit: Payer: Self-pay

## 2021-09-05 MED ORDER — ACCU-CHEK SOFTCLIX LANCETS MISC: 12 refills | Status: DC

## 2021-09-05 MED ORDER — ACCU-CHEK GUIDE VI STRP: ORAL_STRIP | 12 refills | Status: DC

## 2021-09-07 ENCOUNTER — Encounter: Payer: Self-pay | Admitting: Neurology

## 2021-09-07 ENCOUNTER — Ambulatory Visit (INDEPENDENT_AMBULATORY_CARE_PROVIDER_SITE_OTHER): Payer: Medicare Other | Admitting: Neurology

## 2021-09-07 VITALS — BP 112/60 | HR 84 | Ht 65.0 in | Wt 207.0 lb

## 2021-09-07 DIAGNOSIS — R351 Nocturia: Secondary | ICD-10-CM | POA: Diagnosis not present

## 2021-09-07 DIAGNOSIS — R0683 Snoring: Secondary | ICD-10-CM | POA: Diagnosis not present

## 2021-09-07 DIAGNOSIS — N184 Chronic kidney disease, stage 4 (severe): Secondary | ICD-10-CM

## 2021-09-07 DIAGNOSIS — M25473 Effusion, unspecified ankle: Secondary | ICD-10-CM | POA: Insufficient documentation

## 2021-09-07 DIAGNOSIS — G4710 Hypersomnia, unspecified: Secondary | ICD-10-CM | POA: Diagnosis not present

## 2021-09-07 DIAGNOSIS — G4719 Other hypersomnia: Secondary | ICD-10-CM | POA: Insufficient documentation

## 2021-09-07 DIAGNOSIS — G4730 Sleep apnea, unspecified: Secondary | ICD-10-CM

## 2021-09-07 NOTE — Progress Notes (Signed)
SLEEP MEDICINE CLINIC    Provider:  Larey Seat, MD  Primary Care Physician:  [name], [address] [place] [address]     Referring Provider: [name], [address],  [place] [address]     Primary Neurologist: Dr Tomi Likens, DO         Chief Complaint according to patient   Patient presents with:     New Patient (Initial Visit)     Pt with wife, rm 14. Avg 12 hrs of sleep but this is broken and wakes up 4-5 times during the night to voids. Never had a SS. Wife states that he snores and has witness apnea events. Overall well rested when waking up but wife states that he will nod off/on during the day      HISTORY OF PRESENT ILLNESS:  Justin Holloway is a 88  year old African American male patient seen here as a referral on 09/07/2021 from Dr Baird Cancer for a Sleep apnea evaluation .  Chief concern according to patient :  ' I broke the right shoulder and can only sleep on my back, I snore and my wife said I stop breathing, every night and frequently each night" .     Justin Holloway  has a past medical history of Arthritis, CKD (chronic kidney disease), stage IV ([place]),  30 year history of Diabetes mellitus , on Insulin, with Neuropathy and renal insufficiency- diabetic  complication ([place]), Dizziness, Glaucoma, Hyperlipidemia, Hypertension, and TIA (transient ischemic attack). The person had recently seen Dr. Tye Savoy in June 2023 and she diagnosed hypertensive nephropathy as well as diabetic nephropathy stage IV chronic kidney disease, dizziness may be due to his due to the neuropathy and sensory neuropathy that commonly accompanies diabetic neuropathy.  The dizziness can be also static.  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  And he has been on vitamin B12 supplements by shot.    Sleep relevant medical history: Nocturia/ on diuretics, 3-4 times , Adenoid and Tonsillectomy as toddler , sinusitis.  Family medical /sleep  history: 2 sons and 1 daughter are on CPAP with OSA.  Social history:  Patient is retired from [employer], [place], den[employer]- day shifts.   and lives in a household with spouse, no pets.  Tobacco use; none .  ETOH use ;none , Caffeine intake in form of Coffee( 2 a da) Soda( /) Tea ( /) or energy drinks. Regular exercise in form of -nothing.       Sleep habits are as follows: The patient's dinner time is between 5-6 PM. The patient goes to bed at 10.30 PM and continues to sleep for 7 total  hours, wakes for 2-3 bathroom breaks.   The preferred sleep position is supine , with the support of 1 pillow.  Dreams are reportedly frequent/vivid.  10.30  AM is the usual rise time. The patient wakes up spontaneously at 6.30 AM - watches TV  and gets up at 10 AM. He reports not feeling refreshed or restored in AM, with symptoms such as residual fatigue. NO Naps are taken on scheduled- but he dozes off -frequently, lasting from 10 to 15 minutes and are  refreshing .    Review of Systems: Out of a complete 14 system review, the patient complains of only the following symptoms, and all other reviewed systems are negative.:  Fatigue, sleepiness , snoring, fragmented sleep due to nocturia.    How likely are  you to doze in the following situations: 0 = not likely, 1 = slight chance, 2 = moderate chance, 3 = high chance   Sitting and Reading? Watching Television? Sitting inactive in a public place (theater or meeting)? As a passenger in a car for an hour without a break? Lying down in the afternoon when circumstances permit? Sitting and talking to someone? Sitting quietly after lunch without alcohol? In a car, while stopped for a few minutes in traffic?   Total = 9 points/ 24 points   FSS endorsed at 37/ 63 points.  The geriatric depression score was endorsed at 4 out of 15 points.   The patient describes numbness in both feet which are also very swollen, he does not feel numbness in his hand nor  pins and needle abnormality sensation.    Social History   Socioeconomic History   Marital status: Married    Spouse name: [name]   Number of children: 8   Years of education: 12   Highest education level: Not on file  Occupational History   Occupation: retired  Tobacco Use   Smoking status: Former    Types: Cigars   Smokeless tobacco: Never   Tobacco comments:    socially, not long  Vaping Use   Vaping Use: Never used  Substance and Sexual Activity   Alcohol use: Never   Drug use: Never   Sexual activity: Not Currently  Other Topics Concern   Not on file  Social History Narrative   Lives with wife at home    caffeine- coffee 1 cup daily   Social Determinants of Health   Financial Resource Strain: Low Risk  (08/18/2020)   Overall Financial Resource Strain (CARDIA)    Difficulty of Paying Living Expenses: Not hard at all  Food Insecurity: No Food Insecurity (08/18/2020)   Hunger Vital Sign    Worried About Running Out of Food in the Last Year: Never true    [place] in the Last Year: Never true  Transportation Needs: No Transportation Needs (08/18/2020)   PRAPARE - Hydrologist (Medical): No    Lack of Transportation (Non-Medical): No  Physical Activity: Insufficiently Active (08/18/2020)   Exercise Vital Sign    Days of Exercise per Week: 3 days    Minutes of Exercise per Session: 20 min  Stress: No Stress Concern Present (08/18/2020)   [place]    Feeling of Stress : Not at all  Social Connections: [place] (04/17/2018)   Social Connection and Isolation Panel [NHANES]    Frequency of Communication with Friends and Family: More than three times a week    Frequency of Social Gatherings with Friends and Family: More than three times a week    Attends Religious Services: More than 4 times per year    Active Member of [employer] or Organizations: Yes    Attends  Music therapist: More than 4 times per year    Marital Status: Married    Family History  Problem Relation Age of Onset   Cancer Mother 12       breast   COPD Father 79       Congestive heart failure    Past Medical History:  Diagnosis Date   Arthritis    CKD (chronic kidney disease), stage IV ([facility])    Diabetes mellitus without complication ([facility])    Glaucoma    "blurred vision", see  Dr Gershon Crane yearly   Hyperlipidemia    Hypertension    TIA (transient ischemic attack)     Past Surgical History:  Procedure Laterality Date   INGUINAL HERNIA REPAIR Left 06/09/2013   Procedure: OPEN REPAIR LEFT INGUINAL HERNIA;  Surgeon: [name], MD;  Location: [facility];  Service: General;  Laterality: Left;   INSERTION OF MESH Left 06/09/2013   Procedure: INSERTION OF MESH;  Surgeon: [name], MD;  Location: [facility];  Service: General;  Laterality: Left;     Current Outpatient Medications on File Prior to Visit  Medication Sig Dispense Refill   Accu-Chek Softclix Lancets lancets Use as instructed to check blood sugars twice daily E11.69 100 each 12   Ascorbic Acid (VITAMIN C) 1000 MG tablet Take 1,000 mg by mouth daily.      aspirin EC 81 MG tablet Take 81 mg by mouth every evening.     Blood Glucose Monitoring Suppl (ONE TOUCH ULTRA 2) w/Device KIT Use as directed to check blood sugars 2 times per day dx: e11.22 1 kit 0   cholecalciferol (VITAMIN D) 1000 units tablet Take 2,000 Units by mouth daily.      COMBIGAN 0.2-0.5 % ophthalmic solution Place 1 drop into both eyes every 12 (twelve) hours.     diclofenac Sodium (VOLTAREN) 1 % GEL Apply 4 g topically 4 (four) times daily. 350 g 5   furosemide (LASIX) 80 MG tablet TAKE 1 TABLET BY MOUTH IN  THE MORNING AND ONE-HALF  TABLET BY MOUTH IN THE  EARLY EVENING (Patient taking differently: Take 40-80 mg by mouth See admin instructions. 80 mg in the morning and 40 mg in the evening.) 135 tablet 3   glucose blood (ACCU-CHEK  GUIDE) test strip Use as instructed to check blood sugars twice daily E11.69 100 each 12   glucose blood (ONETOUCH ULTRA) test strip USE STRIPS TO TEST BLOOD  SUGAR 3 TIMES DAILY 1 STRIP PER TEST 300 strip 3   hydrALAZINE (APRESOLINE) 10 MG tablet Take 10 mg by mouth 3 (three) times daily.     insulin degludec (TRESIBA) 200 UNIT/ML FlexTouch Pen Inject 24 Units into the skin at bedtime. 3 mL 1   Iron-FA-B Cmp-C-Biot-Probiotic (FUSION PLUS) CAPS Take by mouth. 1 capsule per day     Lancets (ONETOUCH DELICA PLUS [id number]) MISC 100 each by Does not apply route in the morning, at noon, and at bedtime. 300 each 3   LUMIGAN 0.01 % SOLN Place 1 drop into both eyes 2 (two) times daily.      Multiple Vitamin (MULTIVITAMIN WITH MINERALS) TABS tablet Take 1 tablet by mouth daily.     polyethylene glycol powder (GLYCOLAX/MIRALAX) 17 GM/SCOOP powder Take 17 g by mouth daily as needed for mild constipation.     pravastatin (PRAVACHOL) 40 MG tablet TAKE 1 TABLET(40 MG) BY MOUTH AT BEDTIME 90 tablet 1   Semaglutide,0.25 or 0.5MG/DOS, (OZEMPIC, 0.25 OR 0.5 MG/DOSE,) 2 MG/1.5ML SOPN Inject 0.5 mg into the skin once a week. 1.5 mL 1   tamsulosin (FLOMAX) 0.4 MG CAPS capsule Take 0.4 mg by mouth daily.     No current facility-administered medications on file prior to visit.    Allergies  Allergen Reactions   Gabapentin Itching    Physical exam:  Today's Vitals   09/07/21 1527  BP: 112/60  Pulse: 84  Weight: 207 lb (93.9 kg)  Height: 5' 5"  (1.651 m)   Body mass index is 34.45 kg/m.   Wt  Readings from Last 3 Encounters:  09/07/21 207 lb (93.9 kg)  09/01/21 194 lb 9.6 oz (88.3 kg)  06/21/21 202 lb 12.8 oz (92 kg)     Ht Readings from Last 3 Encounters:  09/07/21 5' 5"  (1.651 m)  09/01/21 5' 5"  (1.651 m)  06/21/21 5' 5.4" (1.661 m)      General: The patient is awake, alert and appears not in acute distress. The patient is well groomed. Head: Normocephalic, atraumatic. Neck is supple.  Mallampati 3 plus ,  neck circumference:17 inches . Nasal airflow patent.  Retrognathia is not seen.  Dental status: dentures  Cardiovascular:  Regular rate and cardiac rhythm by pulse,  without distended neck veins. Respiratory: Lungs are clear to auscultation.  Skin:  With evidence of ankle edema. Trunk: The patient's posture is erect.   Neurologic exam : The patient is awake and alert, oriented to place and time.   Memory subjective described as intact.  Attention span & concentration ability appears normal.  Speech is fluent,  without  dysarthria, dysphonia or aphasia.  Mood and affect are appropriate.   Cranial nerves: no loss of smell or taste reported  Pupils are equal and briskly reactive to light. Funduscopic exam deferred.  Extraocular movements in vertical and horizontal planes were intact and without nystagmus. No Diplopia. Visual fields by finger perimetry are intact. Hearing was intact to soft voice and finger rubbing.    Facial sensation intact to fine touch.  Facial motor strength is symmetric and tongue and uvula move midline.  Neck ROM : rotation, tilt and flexion extension were normal for age and shoulder shrug was symmetrical.    Motor exam:  Symmetric bulk, tone and ROM.   Normal tone without cog-wheeling, symmetric grip strength .   Sensory:  Fine touch, pinprick and vibration were diminished - see documented diabetic peripheral neuropathy,    Coordination: Rapid alternating movements in the fingers/hands were of normal speed.  The Finger-to-nose maneuver was intact without evidence of ataxia, dysmetria or tremor.   Gait and station: walker  Deep tendon reflexes: in the  upper and lower extremities are symmetric and intact.  Babinski response was deferred.      After spending a total time of  45  minutes face to face and additional time for physical and neurologic examination, review of laboratory studies,  personal review of imaging studies, reports and  results of other testing and review of referral information / records as far as provided in visit, I have established the following assessments:  1) Mr. Jared clearly has some significant risk factors for obstructive sleep apnea and may be even for central sleep apnea.   His wife describes that he pauses in his breath at night frequently every night.  He will also not all for doze off during the day for power naps but he rarely needs a scheduled nap in daytime.    He is a snorer and he has frequent bathroom breaks at night nocturia can also be related to his diabetic condition it can be promoted by his diuretics, but it can also be a sign of obstructive sleep apnea.    My Plan is to proceed with:   I order a PSG split at [facility] 20/h, this will permit CPAP or BIPAP trial and ,if needed, oxygen supplementation.     if not allowed by [facility] will order HST as alternative.     1)I order a PSG split at [facility] 20/h, if not allowed by [facility]  will order HST as alternative.     I would like to thank [name], MD and [name], [address],  [place] [id number] for allowing me to meet with and to take care of this pleasant patient.     I plan to follow up either personally or through our NP within 3-4 months.   CC: I will share my notes with PCP.  Electronically signed by: Larey Seat, MD 09/07/2021 3:45 PM  [facility] certified by The [employer] of Sleep Medicine and Diplomate of the [employer] of Sleep Medicine. Board certified In Neurology through the [place], Fellow of the [employer] of Neurology. Medical Director of [employer].

## 2021-09-13 ENCOUNTER — Telehealth: Payer: Self-pay

## 2021-09-13 NOTE — Chronic Care Management (AMB) (Signed)
[employer] patient assistance program notification:  120- day supply of Novofine needles will be filled on 09/18/2021 and should arrive to the office in 10-14 business days.   Pattricia Boss, [place] Pharmacist Assistant [phone]

## 2021-09-15 ENCOUNTER — Ambulatory Visit: Payer: Medicare Other | Admitting: Internal Medicine

## 2021-09-15 ENCOUNTER — Ambulatory Visit (INDEPENDENT_AMBULATORY_CARE_PROVIDER_SITE_OTHER): Payer: Medicare Other

## 2021-09-15 VITALS — BP 110/56 | HR 99 | Temp 98.3°F | Ht 67.0 in | Wt 202.8 lb

## 2021-09-15 DIAGNOSIS — Z0000 Encounter for general adult medical examination without abnormal findings: Secondary | ICD-10-CM

## 2021-09-15 NOTE — Progress Notes (Signed)
Subjective:   Justin Holloway is a 88 y.o. male who presents for Medicare Annual/Subsequent preventive examination.  Review of Systems     Cardiac Risk Factors include: advanced age (>94mn, >>64women);diabetes mellitus;dyslipidemia;hypertension;male gender;obesity (BMI >30kg/m2)     Objective:    Today's Vitals   09/15/21 1406  BP: (!) 110/56  Pulse: 99  Temp: 98.3 F (36.8 C)  TempSrc: Oral  SpO2: 99%  Weight: 202 lb 12.8 oz (92 kg)  Height: 5' 7" (1.702 m)   Body mass index is 31.76 kg/m.     09/15/2021    2:23 PM 11/16/2020    2:58 PM 08/18/2020   11:24 AM 05/10/2020    1:00 PM 05/10/2020   12:12 PM 05/04/2020    2:43 PM 05/28/2019   11:59 AM  Advanced Directives  Does Patient Have a Medical Advance Directive? Yes No Yes  No No No  Type of [place]aramed[place]of [place] will  [place] will      Copy of [place]? Yes - validated most recent copy scanned in chart (See row information)  Yes - validated most recent copy scanned in chart (See row information)      Would patient like information on creating a medical advance directive?    Yes (Inpatient - patient defers creating a medical advance directive at this time - Information given)   No - Patient declined    Current Medications (verified) Outpatient Encounter Medications as of 09/15/2021  Medication Sig   Accu-Chek Softclix Lancets lancets Use as instructed to check blood sugars twice daily E11.69   Ascorbic Acid (VITAMIN C) 1000 MG tablet Take 1,000 mg by mouth daily.    aspirin EC 81 MG tablet Take 81 mg by mouth every evening.   Blood Glucose Monitoring Suppl (ONE TOUCH ULTRA 2) w/Device KIT Use as directed to check blood sugars 2 times per day dx: e11.22   cholecalciferol (VITAMIN D) 1000 units tablet Take 2,000 Units by mouth daily.    COMBIGAN 0.2-0.5 % ophthalmic solution Place 1 drop into both eyes every 12 (twelve) hours.    diclofenac Sodium (VOLTAREN) 1 % GEL Apply 4 g topically 4 (four) times daily.   furosemide (LASIX) 80 MG tablet TAKE 1 TABLET BY MOUTH IN  THE MORNING AND ONE-HALF  TABLET BY MOUTH IN THE  EARLY EVENING (Patient taking differently: Take 40-80 mg by mouth See admin instructions. 80 mg in the morning and 40 mg in the evening.)   glucose blood (ACCU-CHEK GUIDE) test strip Use as instructed to check blood sugars twice daily E11.69   glucose blood (ONETOUCH ULTRA) test strip USE STRIPS TO TEST BLOOD  SUGAR 3 TIMES DAILY 1 STRIP PER TEST   hydrALAZINE (APRESOLINE) 10 MG tablet Take 10 mg by mouth 3 (three) times daily.   insulin degludec (TRESIBA) 200 UNIT/ML FlexTouch Pen Inject 24 Units into the skin at bedtime.   Iron-FA-B Cmp-C-Biot-Probiotic (FUSION PLUS) CAPS Take by mouth. 1 capsule per day   Lancets (ONETOUCH DELICA PLUS [id number] MISC 100 each by Does not apply route in the morning, at noon, and at bedtime.   Multiple Vitamin (MULTIVITAMIN WITH MINERALS) TABS tablet Take 1 tablet by mouth daily.   polyethylene glycol powder (GLYCOLAX/MIRALAX) 17 GM/SCOOP powder Take 17 g by mouth daily as needed for mild constipation.   pravastatin (PRAVACHOL) 40 MG tablet TAKE 1 TABLET(40 MG) BY MOUTH AT BEDTIME   Semaglutide,0.25 or 0.5MG/DOS, (OZEMPIC,  0.25 OR 0.5 MG/DOSE,) 2 MG/1.5ML SOPN Inject 0.5 mg into the skin once a week.   tamsulosin (FLOMAX) 0.4 MG CAPS capsule Take 0.4 mg by mouth daily.   LUMIGAN 0.01 % SOLN Place 1 drop into both eyes 2 (two) times daily.  (Patient not taking: Reported on 09/15/2021)   No facility-administered encounter medications on file as of 09/15/2021.    Allergies (verified) Gabapentin   History: Past Medical History:  Diagnosis Date   Arthritis    CKD (chronic kidney disease), stage IV (HCC)    Diabetes mellitus without complication ([place])    Glaucoma    "blurred vision", see Dr Gershon Crane yearly   Hyperlipidemia    Hypertension    TIA (transient ischemic attack)     Past Surgical History:  Procedure Laterality Date   INGUINAL HERNIA REPAIR Left 06/09/2013   Procedure: OPEN REPAIR LEFT INGUINAL HERNIA;  Surgeon: [name], MD;  Location: [facility];  Service: General;  Laterality: Left;   INSERTION OF MESH Left 06/09/2013   Procedure: INSERTION OF MESH;  Surgeon: [name], MD;  Location: [facility];  Service: General;  Laterality: Left;   Family History  Problem Relation Age of Onset   Cancer Mother 23       breast   COPD Father 70       Congestive heart failure   Social History   Socioeconomic History   Marital status: Married    Spouse name: [name]   Number of children: 8   Years of education: 12   Highest education level: Not on file  Occupational History   Occupation: retired  Tobacco Use   Smoking status: Former    Types: Cigars   Smokeless tobacco: Never   Tobacco comments:    socially, not long  Scientific laboratory technician Use: Never used  Substance and Sexual Activity   Alcohol use: Never   Drug use: Never   Sexual activity: Not Currently  Other Topics Concern   Not on file  Social History Narrative   Lives with wife at home    caffeine- coffee 1 cup daily   Social Determinants of Health   Financial Resource Strain: Low Risk  (09/15/2021)   Overall Financial Resource Strain (CARDIA)    Difficulty of Paying Living Expenses: Not hard at all  Food Insecurity: No Food Insecurity (09/15/2021)   Hunger Vital Sign    Worried About Running Out of Food in the Last Year: Never true    Forrest in the Last Year: Never true  Transportation Needs: No Transportation Needs (09/15/2021)   PRAPARE - Hydrologist (Medical): No    Lack of Transportation (Non-Medical): No  Physical Activity: Inactive (09/15/2021)   Exercise Vital Sign    Days of Exercise per Week: 0 days    Minutes of Exercise per Session: 0 min  Stress: No Stress Concern Present (08/18/2020)   [place]    Feeling of Stress : Not at all  Social Connections: [place] (04/17/2018)   Social Connection and Isolation Panel [NHANES]    Frequency of Communication with Friends and Family: More than three times a week    Frequency of Social Gatherings with Friends and Family: More than three times a week    Attends Religious Services: More than 4 times per year    Active Member of Clubs or Organizations: Yes    Attends  Music therapist: More than 4 times per year    Marital Status: Married    Tobacco Counseling Counseling given: Not Answered Tobacco comments: socially, not long   Clinical Intake:  Pre-visit preparation completed: Yes  Pain : No/denies pain     Nutritional Status: BMI > 30  Obese Nutritional Risks: None Diabetes: Yes  How often do you need to have someone help you when you read instructions, pamphlets, or other written materials from your doctor or pharmacy?: 1 - Never  Diabetic? Yes Nutrition Risk Assessment:  Has the patient had any N/V/D within the last 2 months?  No  Does the patient have any non-healing wounds?  No  Has the patient had any unintentional weight loss or weight gain?  No   Diabetes:  Is the patient diabetic?  Yes  If diabetic, was a CBG obtained today?  No  Did the patient bring in their glucometer from home?  No  How often do you monitor your CBG's? daily.   Financial Strains and Diabetes Management:  Are you having any financial strains with the device, your supplies or your medication? No .  Does the patient want to be seen by Chronic Care Management for management of their diabetes?  No  Would the patient like to be referred to a Nutritionist or for Diabetic Management?  No   Diabetic Exams:  Diabetic Eye Exam: Completed 12/15/2020 Diabetic Foot Exam: Completed 02/07/2021  Interpreter Needed?: No  Information entered by :: NAllen LPN   Activities of Daily Living     09/15/2021    2:23 PM  In your present state of health, do you have any difficulty performing the following activities:  Hearing? 0  Vision? 0  Difficulty concentrating or making decisions? 0  Walking or climbing stairs? 1  Dressing or bathing? 0  Doing errands, shopping? 0  Preparing Food and eating ? N  Using the Toilet? N  In the past six months, have you accidently leaked urine? Y  Do you have problems with loss of bowel control? N  Managing your Medications? N  Managing your Finances? N  Housekeeping or managing your Housekeeping? N    Patient Care Team: [name], MD as PCP - General (Internal Medicine) Marylynn Pearson, MD as Consulting Physician (Ophthalmology) [name] [name], RN as Case Manager Mayford Knife, [facility] (Pharmacist)  Indicate any recent Medical Services you may have received from other than [facility] providers in the past year (date may be approximate).     Assessment:   This is a routine wellness examination for Abdulmalik.  Hearing/Vision screen Vision Screening - Comments:: Regular eye exams, [facility]  Dietary issues and exercise activities discussed: Current Exercise Habits: The patient does not participate in regular exercise at present   Goals Addressed             This Visit's Progress    Patient Stated       09/15/2021, no goals       Depression Screen    09/15/2021    2:23 PM 09/01/2021    3:39 PM 08/18/2020   11:24 AM 06/09/2020    3:48 PM 05/28/2019   11:59 AM 04/28/2019   12:13 PM 08/29/2018    9:40 AM  PHQ 2/9 Scores  PHQ - 2 Score 0 0 0 0 0 0 0  PHQ- 9 Score    0 0  3    Fall Risk    09/15/2021    2:23  PM 09/01/2021    3:39 PM 11/16/2020    2:58 PM 08/18/2020   11:24 AM 06/09/2020    3:49 PM  Fall Risk   Falls in the past year? 0 0 1 0 1  Number falls in past yr: 0 0 0  1  Injury with Fall? 0 0 1  1  Risk for fall due to : Medication side effect Impaired mobility  Impaired balance/gait;Medication side effect   Follow up Falls  prevention discussed;Education provided;Falls evaluation completed Falls evaluation completed  Falls evaluation completed;Education provided;Falls prevention discussed     FALL RISK PREVENTION PERTAINING TO THE HOME:  Any stairs in or around the home? No  If so, are there any without handrails? N/a Home free of loose throw rugs in walkways, pet beds, electrical cords, etc? Yes  Adequate lighting in your home to reduce risk of falls? Yes   ASSISTIVE DEVICES UTILIZED TO PREVENT FALLS:  Life alert? No  Use of a cane, walker or w/c? Yes  Grab bars in the bathroom? Yes  Shower chair or bench in shower? Yes  Elevated toilet seat or a handicapped toilet? No   TIMED UP AND GO:  Was the test performed? Yes .  Length of time to ambulate 10 feet: 6 sec.   Gait slow and steady with assistive device  Cognitive Function:        09/15/2021    2:24 PM 08/18/2020   11:26 AM 05/28/2019   12:01 PM 08/29/2018    9:43 AM 12/26/2017   11:23 AM  6CIT Screen  What Year? 0 points 0 points 0 points 0 points 0 points  What month? 0 points 0 points 0 points 0 points 0 points  What time? 0 points 0 points 0 points 0 points 0 points  Count back from 20 2 points 4 points 4 points 4 points 0 points  Months in reverse 4 points 4 points 4 points 4 points 4 points  Repeat phrase 8 points 6 points 0 points 2 points 2 points  Total Score 14 points 14 points 8 points 10 points 6 points    Immunizations Immunization History  Administered Date(s) Administered   Fluad Quad(high Dose 65+) 09/23/2019, 10/06/2020   Influenza, High Dose Seasonal PF 11/21/2017, 08/29/2018   PFIZER(Purple Top)SARS-COV-2 Vaccination 02/01/2019, 03/06/2019, 11/08/2019   Pneumococcal Polysaccharide-23 12/10/1998   Zoster Recombinat (Shingrix) 03/17/2021, 06/21/2021    TDAP status: Up to date  Flu Vaccine status: Due, Education has been provided regarding the importance of this vaccine. Advised may receive this vaccine at local  pharmacy or Health Dept. Aware to provide a copy of the vaccination record if obtained from local pharmacy or Health Dept. Verbalized acceptance and understanding.  Pneumococcal vaccine status: Due, Education has been provided regarding the importance of this vaccine. Advised may receive this vaccine at local pharmacy or Health Dept. Aware to provide a copy of the vaccination record if obtained from local pharmacy or Health Dept. Verbalized acceptance and understanding.  Covid-19 vaccine status: Completed vaccines  Qualifies for Shingles Vaccine? Yes   Zostavax completed Yes   Shingrix Completed?: Yes  Screening Tests Health Maintenance  Topic Date Due   COVID-19 Vaccine (4 - Pfizer risk series) 01/03/2020   INFLUENZA VACCINE  08/09/2021   Pneumonia Vaccine 96+ Years old (2 - PCV) 12/15/2021 (Originally 12/10/1999)   OPHTHALMOLOGY EXAM  12/15/2021   HEMOGLOBIN A1C  12/21/2021   FOOT EXAM  02/07/2022   TETANUS/TDAP  11/12/2022   Zoster Vaccines-  Shingrix  Completed   HPV VACCINES  Aged Out    Health Maintenance  Health Maintenance Due  Topic Date Due   COVID-19 Vaccine (4 - Pfizer risk series) 01/03/2020   INFLUENZA VACCINE  08/09/2021    Colorectal cancer screening: No longer required.   Lung Cancer Screening: (Low Dose CT Chest recommended if Age 49-80 years, 30 pack-year currently smoking OR have quit w/in 15years.) does not qualify.   Lung Cancer Screening Referral: no  Additional Screening:  Hepatitis C Screening: does not qualify;  Vision Screening: Recommended annual ophthalmology exams for early detection of glaucoma and other disorders of the eye. Is the patient up to date with their annual eye exam?  Yes  Who is the provider or what is the name of the office in which the patient attends annual eye exams? [facility] If pt is not established with a provider, would they like to be referred to a provider to establish care? No .   Dental Screening: Recommended  annual dental exams for proper oral hygiene  Community Resource Referral / Chronic Care Management: CRR required this visit?  No   CCM required this visit?  No      Plan:     I have personally reviewed and noted the following in the patient's chart:   Medical and social history Use of alcohol, tobacco or illicit drugs  Current medications and supplements including opioid prescriptions. Patient is not currently taking opioid prescriptions. Functional ability and status Nutritional status Physical activity Advanced directives List of other physicians Hospitalizations, surgeries, and ER visits in previous 12 months Vitals Screenings to include cognitive, depression, and falls Referrals and appointments  In addition, I have reviewed and discussed with patient certain preventive protocols, quality metrics, and best practice recommendations. A written personalized care plan for preventive services as well as general preventive health recommendations were provided to patient.     [name], LPN   09/17/3568   Nurse Notes: none

## 2021-09-15 NOTE — Patient Instructions (Addendum)
Justin Holloway , Thank you for taking time to come for your Medicare Wellness Visit. I appreciate your ongoing commitment to your health goals. Please review the following plan we discussed and let me know if I can assist you in the future.   Screening recommendations/referrals: Colonoscopy: not required Recommended yearly ophthalmology/optometry visit for glaucoma screening and checkup Recommended yearly dental visit for hygiene and checkup  Vaccinations: Influenza vaccine: on 10/04/2021 Pneumococcal vaccine: due Tdap vaccine: completed 11/11/2012, due 11/12/2022 Shingles vaccine: completed   Covid-19:  11/08/2019, 03/06/2019, 02/01/2019  Advanced directives: copy in chart  Conditions/risks identified: none  Next appointment: Follow up in one year for your annual wellness visit.   Preventive Care 86 Years and Older, Male Preventive care refers to lifestyle choices and visits with your health care provider that can promote health and wellness. What does preventive care include? A yearly physical exam. This is also called an annual well check. Dental exams once or twice a year. Routine eye exams. Ask your health care provider how often you should have your eyes checked. Personal lifestyle choices, including: Daily care of your teeth and gums. Regular physical activity. Eating a healthy diet. Avoiding tobacco and drug use. Limiting alcohol use. Practicing safe sex. Taking low doses of aspirin every day. Taking vitamin and mineral supplements as recommended by your health care provider. What happens during an annual well check? The services and screenings done by your health care provider during your annual well check will depend on your age, overall health, lifestyle risk factors, and family history of disease. Counseling  Your health care provider may ask you questions about your: Alcohol use. Tobacco use. Drug use. Emotional well-being. Home and relationship well-being. Sexual  activity. Eating habits. History of falls. Memory and ability to understand (cognition). Work and work Statistician. Screening  You may have the following tests or measurements: Height, weight, and BMI. Blood pressure. Lipid and cholesterol levels. These may be checked every 5 years, or more frequently if you are over 58 years old. Skin check. Lung cancer screening. You may have this screening every year starting at age 66 if you have a 30-pack-year history of smoking and currently smoke or have quit within the past 15 years. Fecal occult blood test (FOBT) of the stool. You may have this test every year starting at age 86. Flexible sigmoidoscopy or colonoscopy. You may have a sigmoidoscopy every 5 years or a colonoscopy every 10 years starting at age 86. Prostate cancer screening. Recommendations will vary depending on your family history and other risks. Hepatitis C blood test. Hepatitis B blood test. Sexually transmitted disease (STD) testing. Diabetes screening. This is done by checking your blood sugar (glucose) after you have not eaten for a while (fasting). You may have this done every 1-3 years. Abdominal aortic aneurysm (AAA) screening. You may need this if you are a current or former smoker. Osteoporosis. You may be screened starting at age 86 if you are at high risk. Talk with your health care provider about your test results, treatment options, and if necessary, the need for more tests. Vaccines  Your health care provider may recommend certain vaccines, such as: Influenza vaccine. This is recommended every year. Tetanus, diphtheria, and acellular pertussis (Tdap, Td) vaccine. You may need a Td booster every 10 years. Zoster vaccine. You may need this after age 26. Pneumococcal 13-valent conjugate (PCV13) vaccine. One dose is recommended after age 110. Pneumococcal polysaccharide (PPSV23) vaccine. One dose is recommended after age 19. Talk to  your health care provider about which  screenings and vaccines you need and how often you need them. This information is not intended to replace advice given to you by your health care provider. Make sure you discuss any questions you have with your health care provider. Document Released: 01/22/2015 Document Revised: 09/15/2015 Document Reviewed: 10/27/2014 Elsevier Interactive Patient Education  2017 [place] Prevention in the Home Falls can cause injuries. They can happen to people of all ages. There are many things you can do to make your home safe and to help prevent falls. What can I do on the outside of my home? Regularly fix the edges of walkways and driveways and fix any cracks. Remove anything that might make you trip as you walk through a door, such as a raised step or threshold. Trim any bushes or trees on the path to your home. Use bright outdoor lighting. Clear any walking paths of anything that might make someone trip, such as rocks or tools. Regularly check to see if handrails are loose or broken. Make sure that both sides of any steps have handrails. Any raised decks and porches should have guardrails on the edges. Have any leaves, snow, or ice cleared regularly. Use sand or salt on walking paths during winter. Clean up any spills in your garage right away. This includes oil or grease spills. What can I do in the bathroom? Use night lights. Install grab bars by the toilet and in the tub and shower. Do not use towel bars as grab bars. Use non-skid mats or decals in the tub or shower. If you need to sit down in the shower, use a plastic, non-slip stool. Keep the floor dry. Clean up any water that spills on the floor as soon as it happens. Remove soap buildup in the tub or shower regularly. Attach bath mats securely with double-sided non-slip rug tape. Do not have throw rugs and other things on the floor that can make you trip. What can I do in the bedroom? Use night lights. Make sure that you have a  light by your bed that is easy to reach. Do not use any sheets or blankets that are too big for your bed. They should not hang down onto the floor. Have a firm chair that has side arms. You can use this for support while you get dressed. Do not have throw rugs and other things on the floor that can make you trip. What can I do in the kitchen? Clean up any spills right away. Avoid walking on wet floors. Keep items that you use a lot in easy-to-reach places. If you need to reach something above you, use a strong step stool that has a grab bar. Keep electrical cords out of the way. Do not use floor polish or wax that makes floors slippery. If you must use wax, use non-skid floor wax. Do not have throw rugs and other things on the floor that can make you trip. What can I do with my stairs? Do not leave any items on the stairs. Make sure that there are handrails on both sides of the stairs and use them. Fix handrails that are broken or loose. Make sure that handrails are as long as the stairways. Check any carpeting to make sure that it is firmly attached to the stairs. Fix any carpet that is loose or worn. Avoid having throw rugs at the top or bottom of the stairs. If you do have throw rugs, attach  them to the floor with carpet tape. Make sure that you have a light switch at the top of the stairs and the bottom of the stairs. If you do not have them, ask someone to add them for you. What else can I do to help prevent falls? Wear shoes that: Do not have high heels. Have rubber bottoms. Are comfortable and fit you well. Are closed at the toe. Do not wear sandals. If you use a stepladder: Make sure that it is fully opened. Do not climb a closed stepladder. Make sure that both sides of the stepladder are locked into place. Ask someone to hold it for you, if possible. Clearly mark and make sure that you can see: Any grab bars or handrails. First and last steps. Where the edge of each step  is. Use tools that help you move around (mobility aids) if they are needed. These include: Canes. Walkers. Scooters. Crutches. Turn on the lights when you go into a dark area. Replace any light bulbs as soon as they burn out. Set up your furniture so you have a clear path. Avoid moving your furniture around. If any of your floors are uneven, fix them. If there are any pets around you, be aware of where they are. Review your medicines with your doctor. Some medicines can make you feel dizzy. This can increase your chance of falling. Ask your doctor what other things that you can do to help prevent falls. This information is not intended to replace advice given to you by your health care provider. Make sure you discuss any questions you have with your health care provider. Document Released: 10/22/2008 Document Revised: 06/03/2015 Document Reviewed: 01/30/2014 Elsevier Interactive Patient Education  2017 [employer].

## 2021-09-21 ENCOUNTER — Ambulatory Visit: Payer: Medicare Other | Admitting: Internal Medicine

## 2021-09-22 ENCOUNTER — Ambulatory Visit: Payer: Self-pay

## 2021-09-22 NOTE — Patient Outreach (Signed)
  Care Coordination   Follow Up Visit Note   09/22/2021 Name: Justin Holloway MRN: [id number] DOB: 06-Sep-1929  Justin Holloway is a 88 y.o. year old male who sees [name], MD for primary care. I spoke with  Justin Holloway by phone today.  What matters to the patients health and wellness today?  Patient is awaiting authorization for a sleep study to evaluate for obstructive sleep apnea.     Goals Addressed               This Visit's Progress     Patient Stated     I am waiting on authorization for my sleep study (pt-stated)        Care Coordination Interventions: Evaluation of current treatment plan related to evaluation of OSA and patient's adherence to plan as established by provider Provided education to patient re: basic disease process for OSA  Provided patient with printed educational materials related to OSA Determined patient completed visit with Dr. Brett Fairy and is awaiting authorization for his sleep study         SDOH assessments and interventions completed:  No     Care Coordination Interventions Activated:  Yes  Care Coordination Interventions:  Yes, provided   Follow up plan: Follow up call scheduled for 11/22/21 '@09'$ :30 AM     Encounter Outcome:  Pt. Visit Completed

## 2021-09-22 NOTE — Patient Instructions (Signed)
Visit Information  Thank you for taking time to visit with me today. Please don't hesitate to contact me if I can be of assistance to you.   Following are the goals we discussed today:   Goals Addressed               This Visit's Progress     Patient Stated     I am waiting on authorization for my sleep study (pt-stated)        Care Coordination Interventions: Evaluation of current treatment plan related to evaluation of OSA and patient's adherence to plan as established by provider Provided education to patient re: basic disease process for OSA  Provided patient with printed educational materials related to OSA Determined patient completed visit with Dr. Brett Fairy and is awaiting authorization for his sleep study     Our next appointment is by telephone on 11/22/21 at 09:30 AM  Please call the care guide team at [phone] if you need to cancel or reschedule your appointment.   If you are experiencing a Mental Health or [place] or need someone to talk to, please call 1-800-273-TALK (toll free, 24 hour hotline)  Patient verbalizes understanding of instructions and care plan provided today and agrees to view in [place]. Active MyChart status and patient understanding of how to access instructions and care plan via MyChart confirmed with patient.     Barb Merino, RN, BSN, CCM Care Management Coordinator [facility] Care Management  Direct Phone: [phone]

## 2021-09-28 ENCOUNTER — Telehealth: Payer: Self-pay | Admitting: Neurology

## 2021-09-28 NOTE — Telephone Encounter (Signed)
[employer]- [employer] medicare no auth req.  Patient is scheduled at [facility] for 10/23/21 at 8 pm.  Mailed packet to the patient.

## 2021-09-29 ENCOUNTER — Ambulatory Visit (INDEPENDENT_AMBULATORY_CARE_PROVIDER_SITE_OTHER): Payer: Medicare Other

## 2021-09-29 VITALS — BP 130/68 | HR 84 | Temp 98.5°F | Ht 67.0 in | Wt 202.0 lb

## 2021-09-29 DIAGNOSIS — Z23 Encounter for immunization: Secondary | ICD-10-CM

## 2021-09-29 NOTE — Progress Notes (Signed)
Patient presents today for flu shot.  

## 2021-10-04 ENCOUNTER — Ambulatory Visit: Payer: Medicare Other

## 2021-10-04 ENCOUNTER — Other Ambulatory Visit: Payer: Self-pay | Admitting: Internal Medicine

## 2021-10-04 ENCOUNTER — Telehealth: Payer: Self-pay

## 2021-10-04 DIAGNOSIS — N184 Chronic kidney disease, stage 4 (severe): Secondary | ICD-10-CM

## 2021-10-04 NOTE — Patient Outreach (Signed)
  Care Coordination   10/04/2021 Name: Justin Holloway MRN: [id number] DOB: 01-04-1930   Care Coordination Outreach Attempts:  An unsuccessful telephone outreach was attempted today to offer the patient information about available care coordination services as a benefit of their health plan.   Follow Up Plan:  Additional outreach attempts will be made to offer the patient care coordination information and services.   Encounter Outcome:  No Answer  Care Coordination Interventions Activated:  No   Care Coordination Interventions:  No, not indicated    Daneen Schick, BSW, CDP Social Worker, Certified Dementia Practitioner [facility] Care Management  Care Coordination [phone]

## 2021-10-05 ENCOUNTER — Telehealth: Payer: Self-pay

## 2021-10-05 NOTE — Patient Outreach (Signed)
  Care Coordination   10/05/2021 Name: Justin Holloway MRN: [id number] DOB: March 12, 1929   Care Coordination Outreach Attempts:  A second unsuccessful outreach was attempted today to offer the patient with information about available care coordination services as a benefit of their health plan.     Follow Up Plan:  Additional outreach attempts will be made to offer the patient care coordination information and services.   Encounter Outcome:  No Answer  Care Coordination Interventions Activated:  No   Care Coordination Interventions:  No, not indicated    Daneen Schick, BSW, CDP Social Worker, Certified Dementia Practitioner [facility] Care Management  Care Coordination [phone]

## 2021-10-10 ENCOUNTER — Ambulatory Visit: Payer: Self-pay

## 2021-10-10 LAB — BASIC METABOLIC PANEL
BUN: 62 — AB (ref 4–21)
CO2: 20 (ref 13–22)
Creatinine: 3.7 — AB (ref 0.6–1.3)
Glucose: 124
Potassium: 4.2 mEq/L (ref 3.5–5.1)
Sodium: 137 (ref 137–147)

## 2021-10-10 LAB — COMPREHENSIVE METABOLIC PANEL
Albumin: 4.1 (ref 3.5–5.0)
Calcium: 9.3 (ref 8.7–10.7)
eGFR: 15

## 2021-10-10 NOTE — Patient Outreach (Signed)
  Care Coordination   Follow Up Visit Note   10/10/2021 Name: Justin Holloway MRN: [id number] DOB: 01-27-1929  Justin Holloway is a 88 y.o. year old male who sees [name], MD for primary care. I  spoke with patients spouse Teejay Meader by phone.  What matters to the patients health and wellness today?  To obtain assistance with bathing    Goals Addressed             This Visit's Progress    COMPLETED: Care Coordination Activities       Care Coordination Interventions: Collaboration with patients primary care provider who requests assistance with caregiver resources Discussed the patients spouse is currently in PT for her back and is having difficulty assisting the patient with bathing Reviewed the patient does not qualify for Medicaid and therefore is ineligible for PCS Education provided on [employer] caregiver respite program - patient needs a more immediate solution  Discussed SW will collaborate with primary care provider to request orders for home health PT/OT gait training with walker and to identify safe bathing techniques in the home. Will also request aid to assist with bathing while home health PT is open Advised the patients provider will have to determine if these services are necessary prior to placing an order Collaboration with [place] to advise of SW interventions for follow up as needed and during future call SW to sign off         SDOH assessments and interventions completed:  No     Care Coordination Interventions Activated:  Yes  Care Coordination Interventions:  Yes, provided   Follow up plan:  No SW follow up planned. The patient will remain engaged with RN Care Manager.    Encounter Outcome:  Pt. Visit Completed   Daneen Schick, BSW, CDP Social Worker, Certified Dementia Practitioner [place] Management  Care Coordination [phone]

## 2021-10-10 NOTE — Patient Instructions (Signed)
Visit Information  Thank you for taking time to visit with me today. Please don't hesitate to contact me if I can be of assistance to you.   Following are the goals we discussed today:   Goals Addressed             This Visit's Progress    COMPLETED: Care Coordination Activities       Care Coordination Interventions: Collaboration with patients primary care provider who requests assistance with caregiver resources Discussed the patients spouse is currently in PT for her back and is having difficulty assisting the patient with bathing Reviewed the patient does not qualify for Medicaid and therefore is ineligible for PCS Education provided on [employer] [place] caregiver respite program - patient needs a more immediate solution  Discussed SW will collaborate with primary care provider to request orders for home health PT/OT gait training with walker and to identify safe bathing techniques in the home. Will also request aid to assist with bathing while home health PT is open Advised the patients provider will have to determine if these services are necessary prior to placing an order Collaboration with [place] to advise of SW interventions for follow up as needed and during future call SW to sign off         If you are experiencing a Mental Health or [place] or need someone to talk to, please call 1-800-273-TALK (toll free, 24 hour hotline)  Patient verbalizes understanding of instructions and care plan provided today and agrees to view in [place]. Active MyChart status and patient understanding of how to access instructions and care plan via MyChart confirmed with patient.     No further follow up required: Please contact your primary care provider as needed  Daneen Schick, Arita Miss, CDP Social Worker, Certified Dementia Practitioner [place] Coordination [phone]

## 2021-10-11 ENCOUNTER — Telehealth: Payer: Self-pay

## 2021-10-11 NOTE — Chronic Care Management (AMB) (Addendum)
[employer] patient assistance program notification:  120- day supply of Tresiba 200 u/ml and Ozempic 0.25/0.5 mg will be filled on 10/20/2021 and should arrive to the office in 10-14 business days.   120- day supply of Novofine needles was filled on 09/21/2021 and should arrived to the office in 10-14 buisness days. Patient has 0 refills left and next fill will be fulfilled on 12/08/2021.  Filling out reorder form for patient to receive Tresiba, Ozempic and needles refills before end of enrollment year.    Pattricia Boss, [place] Pharmacist Assistant [phone]

## 2021-10-20 ENCOUNTER — Telehealth: Payer: Self-pay

## 2021-10-20 NOTE — Chronic Care Management (AMB) (Signed)
[employer] reorder form for [employer], [place] and Novofine needles.  Pattricia Boss, [place] Pharmacist Assistant [phone]

## 2021-10-23 ENCOUNTER — Ambulatory Visit (INDEPENDENT_AMBULATORY_CARE_PROVIDER_SITE_OTHER): Payer: Medicare Other | Admitting: Neurology

## 2021-10-23 DIAGNOSIS — M25473 Effusion, unspecified ankle: Secondary | ICD-10-CM

## 2021-10-23 DIAGNOSIS — R0683 Snoring: Secondary | ICD-10-CM | POA: Diagnosis not present

## 2021-10-23 DIAGNOSIS — G4730 Sleep apnea, unspecified: Secondary | ICD-10-CM

## 2021-10-23 DIAGNOSIS — G4719 Other hypersomnia: Secondary | ICD-10-CM

## 2021-10-23 DIAGNOSIS — G4733 Obstructive sleep apnea (adult) (pediatric): Secondary | ICD-10-CM

## 2021-10-23 DIAGNOSIS — N184 Chronic kidney disease, stage 4 (severe): Secondary | ICD-10-CM

## 2021-10-23 DIAGNOSIS — R351 Nocturia: Secondary | ICD-10-CM

## 2021-11-01 ENCOUNTER — Telehealth: Payer: Self-pay

## 2021-11-01 ENCOUNTER — Ambulatory Visit: Payer: Medicare Other | Admitting: Internal Medicine

## 2021-11-01 NOTE — Telephone Encounter (Signed)
Called patient to inform him that his medication came in and is ready to be picked up. The medications include his Ozempic and [place]. He is going to come by tomorrow or Thursday to pick it up.   [name], CPP, PharmD Clinical Pharmacist Practitioner [facility] [phone]

## 2021-11-07 ENCOUNTER — Telehealth: Payer: Self-pay | Admitting: Neurology

## 2021-11-07 NOTE — Telephone Encounter (Signed)
Wife is asking for a call with results to sleep study

## 2021-11-07 NOTE — Procedures (Signed)
[facility] SPLIT NIGHT INTERPRETATION REPORT   STUDY DATE: 10/23/2021     PATIENT NAME:  Justin Holloway, Justin Holloway.         DATE OF BIRTH:  08-Dec-1929  PATIENT ID:  [id number]    TYPE OF STUDY:  SPLIT  READING PHYSICIAN: Larey Seat, MD   Referred by Dr Baird Cancer, MD  SCORING TECHNICIAN: Richard Miu, RPSGT   INDICATIONS:  09-07-2021; Justin Holloway  is a 88 year-old Male who reports symptoms of snoring , limited comfort - difficulties with finding a sleep position due to shoulder injuries, etc,  wife has witnessed apnea. He has a 30 plus year history of DM, has CKD stage 4, HTN and had TIAs. neuropathy. The Epworth Sleepiness Scale was endorsed at  9 out of 24 (scores above or equal to 10 are suggestive of hypersomnolence).  DESCRIPTION: A RPSGT sleep technologist was in attendance for the duration of the recording.  Data collection, scoring, video monitoring, and reporting were performed in compliance with the AASM Manual for the Scoring of Sleep and Associated Events; (Hypopnea is scored based on the criteria listed in Section VIII D. 1b in the AASM Manual V2.6 using a 4% oxygen desaturation rule or Hypopnea is scored based on the criteria listed in Section VIII D. 1a in the AASM Manual V2.6 using 3% oxygen desaturation and /or arousal rule).    ADDITIONAL INFORMATION:  Height: 65.0 in Weight: 207 lb (BMI 34) Neck Size: 17.0 in Medications: Vitamin C, Aspirin, Vitamin D, Combigan, Voltaren, Lasix, Apresoline, Triseba, Fusion Plus, Lantus, Lumigan, Multivitamin, MiraLax, Pravachol, Ozempic, Flomax  DIAGNOSTIC ANALYSIS     SLEEP CONTINUITY AND SLEEP ARCHITECTURE: STUDY DETAILS: Lights off was at 20:49: and lights on 05:10: (500 minutes hours in bed). This study was performed with an initial diagnostic portion followed by positive airway pressure titration.  The diagnostic portion of the study began at 20:49 and ended at 00:58, for a recording time of 4h  9.47mminutes.  Total sleep time was 126 minutes, with a decreased sleep efficiency at 50.6%.  Sleep latency was increased at 77.0 minutes. REM sleep latency was decreased at 0.0 minutes.     Arousal index was 48.1 /hr. Of the total sleep time, the percentage of stage N1 sleep was 7.5%, stage N2 sleep was 92.5%, stage N3 sleep was 0.0%, and REM sleep was 0.0%. There were 0 Stage R periods observed during this portion of the study, 21 awakenings (i.e. transitions to Stage W from any sleep stage), and 52.0 total stage transitions. Wake after sleep onset (WASO) time accounted for 46 minutes.   RESPIRATORY MONITORING:  Based on CMS criteria (using a 4% oxygen desaturation rule for scoring hypopneas), there were 143 apneas (140 obstructive; 2 central; 1 mixed), and 16 hypopneas.   Apnea index was 51.0/h. Hypopnea index was 2.4/h. The AHI ( apnea-hypopnea index) total was 53.3/h overall (53.3 supine; 0.0 REM, 0.0 supine REM). There were 0 respiratory effort-related arousals (RERAs).  Snoring was classified as moderate. LIMB MOVEMENTS: There were 0 periodic limb movements of sleep (0.0/hr), of which 0 (0.0/hr) were associated with an arousal.   OXIMETRY: Oxyhemoglobin desaturations (nadir 58 %) from a normal baseline (mean 100%). Total time spent at, or below 88% was 9.6 minutes, or 7.6%  of total sleep time.    BODY POSITION: Duration of total sleep and percent of total sleep in their respective position is as follows: supine 126 minutes minutes (100.0%), non-supine 0.0 minutes (  0.0%); right 00 minutes minutes (0.0%), left 00 minutes minutes (0.0%), and prone 00 minutes minutes (0.0%). Total supine REM sleep time was 00 minutes minutes (0.0% of total REM sleep). AROUSAL (Baseline): There were 102 arousals in total, for an arousal index of 42.4 /h.  Of these, 67.0 were identified as respiratory-related arousals (31.9 /hr), 0 were PLM-related arousals (0.0 /hr), and 35 were non-specific arousals (16.7 /hr) EKG:  Analysis of electrocardiogram activity showed the highest heart rate for the baseline portion of the study was 92.0 beats per minute.  PVCs were frequently seen.  The average heart rate during sleep was 72 bpm, while the highest heart rate for the same period was 83 bpm.    TREATMENT ANALYSIS SLEEP CONTINUITY AND SLEEP ARCHITECTURE:  SPLIT night protocol was initiated- a FFM was chosen by the technologist, who started on 5 cm watr pressure and titrated the patient to 15 cm with 2cm EPR,   The treatment portion of the study began at 00:58 and ended at 05:10, for a recording time of 4 h 11.22mminutes.  Total sleep time was 210 minutes (100.0% supine;  0.0% lateral; 0.0% prone, 26.6% REM sleep), with a decreased sleep efficiency at 83.7%.  Sleep latency was normal at 14.0 minutes. REM sleep latency was decreased at 3.0 minutes.  Arousal index was 4.8 /hr. Of the total sleep time, the percentage of stage N1 sleep was 1.9%, stage N3 sleep was 0.0%, and REM sleep was 26.6%. There were 2 Stage R periods observed during this portion of the study, 6 awakenings (i.e. transitions to Stage W from any sleep stage), and 18.0 total stage transitions. Wake after sleep onset (WASO) time accounted for 27 minutes.   AROUSAL: There were 12.0 arousals in total, for an arousal index of 3.4 arousals/hour.  Of these, 5.0 were identified as respiratory-related arousals (1.4 /hr), 0 were PLM-related arousals (0.0 /hr), and 12 were non-specific arousals (3.4 /hr)  RESPIRATORY MONITORING:    While on PAP therapy, based on CMS criteria, the AHI ( apnea-hypopnea index) was 13.4/h ,  AHI was 13.4/h in supine; 11.4/h in REM.  Snoring was controlled . There were 2 central apneas noted while the obstructive events were reduced from 107 pre CPAP  to zero post CPAP at final pressure  .  LIMB MOVEMENTS: There were 0 periodic limb movements of sleep (0.0/hr), of which 0 (0.0/hr) were associated with an arousal.   OXIMETRY: Respiratory  events were associated with oxyhemoglobin desaturation (nadir 83.0%) from a mean of 99.0%.  Total time spent below 89 % was 8.0 minutes, or 3.8%  of total sleep time.  BODY POSITION: Duration of total sleep and percent of total sleep in their respective position is as follows: supine 210 minutes minutes (100.0%), non-supine 0.0 minutes (0.0%). Total supine REM sleep time was 56 minutes minutes (100.0% of total REM sleep).   EKG: showed the highest heart rate was 91.0 beats per minute with isolated PVCs.  The average heart rate during sleep was 72 bpm, while the highest heart rate for the same period was 84 bpm.   IMPRESSION:\ 1)Severe Obstructive sleep apnea with moderate snoring and brief, but frequent oxygen desaturations were seen at baseline. No REM sleep at baseline . PVCs were noted.   2) CPAP at a finally explored pressure of 14 cm water reduced the AHI to 4.71 /h . This was under all- supine sleep and with a 25 % REM sleep proportion.     RECOMMENDATIONS: 1) this  CPAP was titrated in supine sleep only, (and if the patient remains restricted in his ability to sleep on his side ) this is the best solution for him, using a FFM Simplus in medium size.   2) His auto CPAP by ResMed will be set with a ramp from 7 cm water through 16 cm water, 2 cm EPR and heated humidification.    3) Weight loss can help to reduce apneas, but the patient diabetes-, kidney- and  heart -specialists should recommend a diet with the help of nutritionist     Larey Seat,  MD    CODED DIAGNOSES:

## 2021-11-07 NOTE — Addendum Note (Signed)
Addended by: Larey Seat on: 11/07/2021 11:14 AM   Modules accepted: Orders

## 2021-11-08 NOTE — Telephone Encounter (Signed)
LVM returning call. Asked for them to call office back to go over results.

## 2021-11-09 NOTE — Telephone Encounter (Signed)
Pt wife is calling. Requesting call-back from nurse.

## 2021-11-09 NOTE — Telephone Encounter (Signed)
I called pt. I advised pt that Dr. Brett Fairy reviewed their sleep study results and found that pt has severe sleep apnea recommends that pt starts auto CPAP. I reviewed PAP compliance expectations with the pt. Pt is agreeable to starting a CPAP. I advised pt that an order will be sent to a DME, [facility], and [facility] will call the pt within about one week after they file with the pt's insurance. [facility] will show the pt how to use the machine, fit for masks, and troubleshoot the CPAP if needed. A follow up appt was made for insurance purposes with Frann Rider on Jan 24,2024 at 12:45 pm. Pt verbalized understanding to arrive 15 minutes early and bring their CPAP. A letter with all of this information in it will be mailed to the pt as a reminder. I verified with the pt that the address we have on file is correct. Pt verbalized understanding of results. Pt had no questions at this time but was encouraged to call back if questions arise. I have sent the order to [facility]  and have received confirmation that they have received the order.

## 2021-11-22 ENCOUNTER — Ambulatory Visit: Payer: Self-pay

## 2021-11-22 NOTE — Patient Instructions (Addendum)
Visit Information  Thank you for taking time to visit with me today. Please don't hesitate to contact me if I can be of assistance to you.   Following are the goals we discussed today:   Goals Addressed               This Visit's Progress     Patient Stated     I am waiting on authorization for my sleep study (pt-stated)        Care Coordination Interventions: Evaluation of current treatment plan related to evaluation of OSA and patient's adherence to plan as established by provider Determined patient was advised he has severe Obstructive Sleep Apnea and will need to start using a CPAP machine Noted patient was advised on 11/07/21 his DME supplier is [facility]  Determined patient has not received his CPAP machine Placed outbound call to [employer], spoke with [facility] who advised the ResMed S11 ordered by Dr. Brett Fairy is on back order with an unknown ETA Sent in basket message to Dr. Brett Fairy advising the ResMed S11 is on back order, however the S10 is in stock Received reply from [facility] RN advising Dr. Brett Fairy is okay with patient using the ResMed S10 as long as it contains a modem Placed outbound call to [employer], spoke with [facility] who advised the ResMed S10 does contain a modem, she will call the patient to coordinate a visit in order to demonstrate usage of the ResMed S10          I have a cold (pt-stated)        Care Coordination Interventions: Evaluation of current treatment plan related to upper respiratory virus and patient's adherence to plan as established by provider Determined patient feels he has a cold, symptoms started 3 days ago, patient states his symptoms are improving  Instructed patient to contact Dr. Baird Cancer promptly to report new or worsening symptoms and to stay well hydrated, balancing his activity with rest  Sent in basket message to Dr. Baird Cancer to make her aware of patient's reported symptoms            Our next appointment is by  telephone on 12/06/21 at 1230 AM  Please call the care guide team at [phone] if you need to cancel or reschedule your appointment.   If you are experiencing a Mental Health or [place] or need someone to talk to, please call 1-800-273-TALK (toll free, 24 hour hotline)  Patient verbalizes understanding of instructions and care plan provided today and agrees to view in [place]. Active MyChart status and patient understanding of how to access instructions and care plan via MyChart confirmed with patient.     Barb Merino, RN, BSN, CCM Care Management Coordinator [facility] Care Management  Direct Phone: [phone]

## 2021-11-22 NOTE — Patient Outreach (Signed)
  Care Coordination   Follow Up Visit Note   11/22/2021 Name: HJALMAR BALLENGEE MRN: [id number] DOB: [birth year]  Linton Ham Sizelove is a 88 y.o. year old male who sees [name], MD for primary care. I spoke with  [name] by phone today.  What matters to the patients health and wellness today?  Patient has a new diagnosis of OSA that will require CPAP usage. Patient has new symptoms suggestive of an upper respiratory virus.    Goals Addressed               This Visit's Progress     Patient Stated     I am waiting on authorization for my sleep study (pt-stated)        Care Coordination Interventions: Evaluation of current treatment plan related to evaluation of OSA and patient's adherence to plan as established by provider Determined patient was advised he has severe Obstructive Sleep Apnea and will need to start using a CPAP machine Noted patient was advised on 11/07/21 his DME supplier is [facility]  Determined patient has not received his CPAP machine Placed outbound call to [employer], spoke with [facility] who advised the ResMed S11 ordered by Dr. Brett Fairy is on back order with an unknown ETA Sent in basket message to Dr. Brett Fairy advising the ResMed S11 is on back order, however the S10 is in stock Received reply from [facility] RN advising Dr. Brett Fairy is okay with patient using the ResMed S10 as long as it contains a modem Placed outbound call to [employer], spoke with [facility] who advised the ResMed S10 does contain a modem, she will call the patient to coordinate a visit in order to demonstrate usage of the ResMed S10          I have a cold (pt-stated)        Care Coordination Interventions: Evaluation of current treatment plan related to upper respiratory virus and patient's adherence to plan as established by provider Determined patient feels he has a cold, symptoms started 3 days ago, patient states his symptoms are improving  Instructed patient to  contact Dr. Baird Cancer promptly to report new or worsening symptoms and to stay well hydrated, balancing his activity with rest  Sent in basket message to Dr. Baird Cancer to make her aware of patient's reported symptoms            SDOH assessments and interventions completed:  No     Care Coordination Interventions Activated:  Yes  Care Coordination Interventions:  Yes, provided   Follow up plan: Follow up call scheduled for 12/06/21 '@1230'$  PM    Encounter Outcome:  Pt. Visit Completed

## 2021-11-24 ENCOUNTER — Ambulatory Visit: Payer: Self-pay | Admitting: Internal Medicine

## 2021-12-04 ENCOUNTER — Other Ambulatory Visit: Payer: Self-pay | Admitting: Internal Medicine

## 2021-12-12 DIAGNOSIS — G4733 Obstructive sleep apnea (adult) (pediatric): Secondary | ICD-10-CM | POA: Diagnosis not present

## 2021-12-13 ENCOUNTER — Ambulatory Visit: Payer: Self-pay

## 2021-12-13 NOTE — Patient Outreach (Signed)
  Care Coordination   Follow Up Visit Note   12/13/2021 Name: Justin Holloway MRN: [id number] DOB: 08/12/1929  Justin Holloway is a 88 y.o. year old male who sees Justin Chard, MD for primary care. I spoke with  Justin Holloway by phone today.  What matters to the patients health and wellness today?  Patient will start using his CPAP machine today. He would like to resume taking Gabapentin for Neuropathy.     Goals Addressed               This Visit's Progress     Patient Stated     I am waiting on authorization for my sleep study (pt-stated)        Care Coordination Interventions: Evaluation of current treatment plan related to evaluation of OSA and patient's adherence to plan as established by provider Determined patient received his CPAP machine today, he verbalizes understanding on how to use his CPAP machine and plans to start using today Encouraged patient to use his CPAP as directed and to report any concerns to his doctor       COMPLETED: I have a cold (pt-stated)        Care Coordination Interventions: Evaluation of current treatment plan related to upper respiratory virus and patient's adherence to plan as established by provider Discussed with patient, his cold symptoms have resolved, he feels he has returned back to his baseline      I would like to restart Gabapentin for my Neuropathy (pt-stated)        Care Coordination Interventions: Evaluation of current treatment plan related to neuropathy and patient's adherence to plan as established by provider Determined patient would like to ask his PCP to renew his Gabapentin for treatment of his neuropathy Reviewed upcoming PCP visit with Justin Holloway scheduled for 12/29/21 '@1120'$  AM           SDOH assessments and interventions completed:  No     Care Coordination Interventions:  Yes, provided   Follow up plan: Follow up call scheduled for 02/03/22 '@0100'$  PM     Encounter Outcome:  Pt. Visit Completed

## 2021-12-13 NOTE — Patient Instructions (Signed)
Visit Information  Thank you for taking time to visit with me today. Please don't hesitate to contact me if I can be of assistance to you.   Following are the goals we discussed today:   Goals Addressed               This Visit's Progress     Patient Stated     I am waiting on authorization for my sleep study (pt-stated)        Care Coordination Interventions: Evaluation of current treatment plan related to evaluation of OSA and patient's adherence to plan as established by provider Determined patient received his CPAP machine today, he verbalizes understanding on how to use his CPAP machine and plans to start using today Encouraged patient to use his CPAP as directed and to report any concerns to his doctor       COMPLETED: I have a cold (pt-stated)        Care Coordination Interventions: Evaluation of current treatment plan related to upper respiratory virus and patient's adherence to plan as established by provider Discussed with patient, his cold symptoms have resolved, he feels he has returned back to his baseline      I would like to restart Gabapentin for my Neuropathy (pt-stated)        Care Coordination Interventions: Evaluation of current treatment plan related to neuropathy and patient's adherence to plan as established by provider Determined patient would like to ask his PCP to renew his Gabapentin for treatment of his neuropathy Reviewed upcoming PCP visit with Dr. Baird Cancer scheduled for 12/29/21 '@1120'$  AM           Our next appointment is by telephone on 02/03/22 at 0100 PM  Please call the care guide team at [phone] if you need to cancel or reschedule your appointment.   If you are experiencing a Mental Health or Heath or need someone to talk to, please call 1-800-273-TALK (toll free, 24 hour hotline)  Patient verbalizes understanding of instructions and care plan provided today and agrees to view in [place]. Active MyChart status and  patient understanding of how to access instructions and care plan via MyChart confirmed with patient.     Barb Merino, RN, BSN, CCM Care Management Coordinator [facility] Care Management  Direct Phone: [phone]

## 2021-12-29 ENCOUNTER — Encounter: Payer: Medicare Other | Admitting: Internal Medicine

## 2021-12-29 ENCOUNTER — Telehealth: Payer: Self-pay

## 2021-12-29 NOTE — Chronic Care Management (AMB) (Signed)
12-29-2021: Left patient a VM informing him that Ozempic is ready for pick up and can be picked up today or when the office open's back up on 01-04-2022.  [place] Pharmacist Assistant [phone]

## 2022-01-12 DIAGNOSIS — G4733 Obstructive sleep apnea (adult) (pediatric): Secondary | ICD-10-CM | POA: Diagnosis not present

## 2022-02-01 ENCOUNTER — Ambulatory Visit: Payer: Medicare Other | Admitting: Adult Health

## 2022-02-03 ENCOUNTER — Ambulatory Visit: Payer: Self-pay

## 2022-02-03 NOTE — Patient Outreach (Signed)
  Care Coordination   02/03/2022 Name: DERREN SUYDAM MRN: [id number] DOB: 1929/01/31   Care Coordination Outreach Attempts:  An unsuccessful telephone outreach was attempted for a scheduled appointment today.  Follow Up Plan:  Additional outreach attempts will be made to offer the patient care coordination information and services.   Encounter Outcome:  No Answer   Care Coordination Interventions:  No, not indicated    Barb Merino, RN, BSN, CCM Care Management Coordinator [facility] Care Management Direct Phone: [phone]

## 2022-02-12 DIAGNOSIS — G4733 Obstructive sleep apnea (adult) (pediatric): Secondary | ICD-10-CM | POA: Diagnosis not present

## 2022-02-15 DIAGNOSIS — N189 Chronic kidney disease, unspecified: Secondary | ICD-10-CM | POA: Diagnosis not present

## 2022-02-15 DIAGNOSIS — N184 Chronic kidney disease, stage 4 (severe): Secondary | ICD-10-CM | POA: Diagnosis not present

## 2022-02-20 DIAGNOSIS — N184 Chronic kidney disease, stage 4 (severe): Secondary | ICD-10-CM | POA: Diagnosis not present

## 2022-02-20 DIAGNOSIS — R609 Edema, unspecified: Secondary | ICD-10-CM | POA: Diagnosis not present

## 2022-02-20 DIAGNOSIS — N2581 Secondary hyperparathyroidism of renal origin: Secondary | ICD-10-CM | POA: Diagnosis not present

## 2022-02-20 DIAGNOSIS — D631 Anemia in chronic kidney disease: Secondary | ICD-10-CM | POA: Diagnosis not present

## 2022-02-20 DIAGNOSIS — I129 Hypertensive chronic kidney disease with stage 1 through stage 4 chronic kidney disease, or unspecified chronic kidney disease: Secondary | ICD-10-CM | POA: Diagnosis not present

## 2022-02-21 ENCOUNTER — Ambulatory Visit (INDEPENDENT_AMBULATORY_CARE_PROVIDER_SITE_OTHER): Payer: Medicare Other | Admitting: Internal Medicine

## 2022-02-21 ENCOUNTER — Telehealth: Payer: Self-pay | Admitting: *Deleted

## 2022-02-21 ENCOUNTER — Encounter: Payer: Self-pay | Admitting: Internal Medicine

## 2022-02-21 VITALS — BP 134/60 | HR 93 | Temp 97.8°F | Ht 67.0 in | Wt 201.8 lb

## 2022-02-21 DIAGNOSIS — Z0000 Encounter for general adult medical examination without abnormal findings: Secondary | ICD-10-CM

## 2022-02-21 DIAGNOSIS — E1122 Type 2 diabetes mellitus with diabetic chronic kidney disease: Secondary | ICD-10-CM | POA: Diagnosis not present

## 2022-02-21 DIAGNOSIS — N2581 Secondary hyperparathyroidism of renal origin: Secondary | ICD-10-CM

## 2022-02-21 DIAGNOSIS — Z794 Long term (current) use of insulin: Secondary | ICD-10-CM | POA: Diagnosis not present

## 2022-02-21 DIAGNOSIS — E7800 Pure hypercholesterolemia, unspecified: Secondary | ICD-10-CM | POA: Diagnosis not present

## 2022-02-21 DIAGNOSIS — R82998 Other abnormal findings in urine: Secondary | ICD-10-CM

## 2022-02-21 DIAGNOSIS — I129 Hypertensive chronic kidney disease with stage 1 through stage 4 chronic kidney disease, or unspecified chronic kidney disease: Secondary | ICD-10-CM

## 2022-02-21 DIAGNOSIS — H6121 Impacted cerumen, right ear: Secondary | ICD-10-CM

## 2022-02-21 DIAGNOSIS — I739 Peripheral vascular disease, unspecified: Secondary | ICD-10-CM

## 2022-02-21 DIAGNOSIS — Z23 Encounter for immunization: Secondary | ICD-10-CM

## 2022-02-21 DIAGNOSIS — E785 Hyperlipidemia, unspecified: Secondary | ICD-10-CM

## 2022-02-21 DIAGNOSIS — N184 Chronic kidney disease, stage 4 (severe): Secondary | ICD-10-CM

## 2022-02-21 DIAGNOSIS — E559 Vitamin D deficiency, unspecified: Secondary | ICD-10-CM

## 2022-02-21 LAB — POCT URINALYSIS DIPSTICK
Bilirubin, UA: NEGATIVE
Glucose, UA: NEGATIVE
Ketones, UA: NEGATIVE
Nitrite, UA: NEGATIVE
Protein, UA: POSITIVE — AB
Spec Grav, UA: 1.02 (ref 1.010–1.025)
Urobilinogen, UA: 0.2 E.U./dL
pH, UA: 5.5 (ref 5.0–8.0)

## 2022-02-21 MED ORDER — TETANUS-DIPHTH-ACELL PERTUSSIS 5-2.5-18.5 LF-MCG/0.5 IM SUSP: 0.5000 mL | Freq: Once | INTRAMUSCULAR | 0 refills | Status: AC

## 2022-02-21 NOTE — Progress Notes (Signed)
Barnet Glasgow Martin,acting as a Education administrator for [name], MD.,have documented all relevant documentation on the behalf of [name], MD,as directed by  [name], MD while in the presence of [name], MD.   Subjective:     Patient ID: Justin Holloway , male    DOB: 1929-12-15 , 89 y.o.   MRN: [id number]   Chief Complaint  Patient presents with   Annual Exam   Diabetes   Hypertension    HPI  Patient presents today for annual exam. He reports compliance with meds. He denies headaches, chest pain and shortness of breath.  He is doing good and does not have any concerns today.  BP Readings from Last 3 Encounters: 02/21/22 : 134/60 09/29/21 : 130/68 09/15/21 : (!) 110/56      Diabetes He presents for his follow-up diabetic visit. He has type 2 diabetes mellitus. His disease course has been stable. Pertinent negatives for diabetes include no blurred vision, no polydipsia, no polyphagia and no polyuria. There are no hypoglycemic complications. Risk factors for coronary artery disease include dyslipidemia, hypertension, diabetes mellitus, male sex, sedentary lifestyle and obesity. He is compliant with treatment most of the time. He is following a generally healthy diet. He participates in exercise intermittently. Eye exam is not current.  Hypertension This is a chronic problem. The current episode started more than 1 year ago. The problem is controlled. Pertinent negatives include no blurred vision, palpitations or shortness of breath. Past treatments include diuretics. The current treatment provides moderate improvement. Hypertensive end-organ damage includes kidney disease.     Past Medical History:  Diagnosis Date   Arthritis    CKD (chronic kidney disease), stage IV (HCC)    Diabetes mellitus without complication ([place])    Glaucoma    "blurred vision", see Dr Gershon Crane yearly   Hyperlipidemia    Hypertension    TIA (transient ischemic attack)      Family  History  Problem Relation Age of Onset   Cancer Mother 34       breast   COPD Father 74       Congestive heart failure     Current Outpatient Medications:    Accu-Chek Softclix Lancets lancets, Use as instructed to check blood sugars twice daily E11.69, Disp: 100 each, Rfl: 12   Ascorbic Acid (VITAMIN C) 1000 MG tablet, Take 1,000 mg by mouth daily. , Disp: , Rfl:    aspirin EC 81 MG tablet, Take 81 mg by mouth every evening., Disp: , Rfl:    Blood Glucose Monitoring Suppl (ONE TOUCH ULTRA 2) w/Device KIT, Use as directed to check blood sugars 2 times per day dx: e11.22, Disp: 1 kit, Rfl: 0   cholecalciferol (VITAMIN D) 1000 units tablet, Take 2,000 Units by mouth daily. , Disp: , Rfl:    COMBIGAN 0.2-0.5 % ophthalmic solution, Place 1 drop into both eyes every 12 (twelve) hours., Disp: , Rfl:    diclofenac Sodium (VOLTAREN) 1 % GEL, Apply 4 g topically 4 (four) times daily., Disp: 350 g, Rfl: 5   furosemide (LASIX) 80 MG tablet, TAKE 1 TABLET BY MOUTH IN  THE MORNING AND ONE-HALF  TABLET BY MOUTH IN THE  EARLY EVENING (Patient taking differently: Take 40-80 mg by mouth See admin instructions. 80 mg in the morning and 40 mg in the evening.), Disp: 135 tablet, Rfl: 3   glucose blood (ACCU-CHEK GUIDE) test strip, Use as instructed to check blood sugars twice daily E11.69,  Disp: 100 each, Rfl: 12   glucose blood (ONETOUCH ULTRA) test strip, USE STRIPS TO TEST BLOOD  SUGAR 3 TIMES DAILY 1 STRIP PER TEST, Disp: 300 strip, Rfl: 3   hydrALAZINE (APRESOLINE) 10 MG tablet, Take 10 mg by mouth 3 (three) times daily., Disp: , Rfl:    insulin degludec (TRESIBA) 200 UNIT/ML FlexTouch Pen, Inject 24 Units into the skin at bedtime., Disp: 3 mL, Rfl: 1   Iron-FA-B Cmp-C-Biot-Probiotic (FUSION PLUS) CAPS, Take by mouth. 1 capsule per day, Disp: , Rfl:    Lancets (ONETOUCH DELICA PLUS 123XX123) MISC, 100 each by Does not apply route in the morning, at noon, and at bedtime., Disp: 300 each, Rfl: 3   Multiple  Vitamin (MULTIVITAMIN WITH MINERALS) TABS tablet, Take 1 tablet by mouth daily., Disp: , Rfl:    polyethylene glycol powder (GLYCOLAX/MIRALAX) 17 GM/SCOOP powder, Take 17 g by mouth daily as needed for mild constipation., Disp: , Rfl:    pravastatin (PRAVACHOL) 40 MG tablet, TAKE 1 TABLET(40 MG) BY MOUTH AT BEDTIME, Disp: 90 tablet, Rfl: 1   Semaglutide,0.25 or 0.'5MG'$ /DOS, (OZEMPIC, 0.25 OR 0.5 MG/DOSE,) 2 MG/1.5ML SOPN, Inject 0.5 mg into the skin once a week., Disp: 1.5 mL, Rfl: 1   tamsulosin (FLOMAX) 0.4 MG CAPS capsule, Take 0.4 mg by mouth daily., Disp: , Rfl:    cephALEXin (KEFLEX) 250 MG capsule, One tab po q12h, Disp: 14 capsule, Rfl: 0   LUMIGAN 0.01 % SOLN, Place 1 drop into both eyes 2 (two) times daily.  (Patient not taking: Reported on 09/15/2021), Disp: , Rfl:    Allergies  Allergen Reactions   Gabapentin Itching     Men's preventive visit. Patient Health Questionnaire (PHQ-2) is  [place] Office Visit from 02/21/2022 in [facility]  PHQ-2 Total Score 0     . Patient is on a diabetic diet. Marital status: Married. Relevant history for alcohol use is:  Social History   Substance and Sexual Activity  Alcohol Use Never  . Relevant history for tobacco use is:  Social History   Tobacco Use  Smoking Status Former   Types: Cigars  Smokeless Tobacco Never  Tobacco Comments   socially, not long  .   Review of Systems  Constitutional: Negative.   HENT: Negative.    Eyes: Negative.  Negative for blurred vision.  Respiratory: Negative.  Negative for shortness of breath.   Cardiovascular: Negative.  Negative for palpitations.  Gastrointestinal: Negative.   Endocrine: Negative.  Negative for polydipsia, polyphagia and polyuria.  Genitourinary: Negative.   Musculoskeletal: Negative.   Skin: Negative.   Allergic/Immunologic: Negative.   Neurological: Negative.   Hematological: Negative.   Psychiatric/Behavioral: Negative.        Today's Vitals   02/21/22 1409  BP: 134/60  Pulse: 93  Temp: 97.8 F (36.6 C)  TempSrc: Oral  Weight: 201 lb 12.8 oz (91.5 kg)  Height: '5\' 7"'$  (1.702 m)  PainSc: 0-No pain   Body mass index is 31.61 kg/m.  Wt Readings from Last 3 Encounters:  02/21/22 201 lb 12.8 oz (91.5 kg)  09/29/21 202 lb (91.6 kg)  09/15/21 202 lb 12.8 oz (92 kg)    Objective:  Physical Exam Vitals and nursing note reviewed.  Constitutional:      Appearance: Normal appearance.  HENT:     Head: Normocephalic and atraumatic.     Right Ear: Ear canal and external ear normal. There is impacted cerumen.     Left Ear:  Tympanic membrane, ear canal and external ear normal.     Nose:     Comments: Masked     Mouth/Throat:     Comments: Masked  Eyes:     Extraocular Movements: Extraocular movements intact.     Conjunctiva/sclera: Conjunctivae normal.     Pupils: Pupils are equal, round, and reactive to light.  Cardiovascular:     Rate and Rhythm: Normal rate and regular rhythm.     Pulses: Normal pulses.     Heart sounds: Normal heart sounds.  Pulmonary:     Effort: Pulmonary effort is normal.     Breath sounds: Normal breath sounds.  Chest:  Breasts:    Right: Normal. No swelling, bleeding, inverted nipple, mass or nipple discharge.     Left: Normal. No swelling, bleeding, inverted nipple, mass or nipple discharge.  Abdominal:     General: Bowel sounds are normal.     Palpations: Abdomen is soft.     Comments: Rounded, soft  Genitourinary:    Comments: Deferred  Musculoskeletal:        General: Normal range of motion.     Cervical back: Normal range of motion and neck supple.     Right lower leg: Edema present.     Left lower leg: Edema present.     Comments: Ambulatory with walker  Skin:    General: Skin is warm.  Neurological:     General: No focal deficit present.     Mental Status: He is alert.  Psychiatric:        Mood and Affect: Mood normal.        Behavior: Behavior normal.       Assessment And Plan:    1. Encounter for annual health examination Comments: A full exam was performed.  DRE deferred, per patient request.  PATIENT IS ADVISED TO GET 30-45 MINUTES REGULAR EXERCISE NO LESS THAN FOUR TO FIVE DAYS PER WEEK - BOTH WEIGHTBEARING EXERCISES AND AEROBIC ARE RECOMMENDED.  PATIENT IS ADVISED TO FOLLOW A HEALTHY DIET WITH AT LEAST SIX FRUITS/VEGGIES PER DAY, DECREASE INTAKE OF RED MEAT, AND TO INCREASE FISH INTAKE TO TWO DAYS PER WEEK.  MEATS/FISH SHOULD NOT BE FRIED, BAKED OR BROILED IS PREFERABLE.  IT IS ALSO IMPORTANT TO CUT BACK ON YOUR SUGAR INTAKE. PLEASE AVOID ANYTHING WITH ADDED SUGAR, CORN SYRUP OR OTHER SWEETENERS. IF YOU MUST USE A SWEETENER, YOU CAN TRY STEVIA. IT IS ALSO IMPORTANT TO AVOID ARTIFICIALLY SWEETENERS AND DIET BEVERAGES. LASTLY, I SUGGEST WEARING SPF 50 SUNSCREEN ON EXPOSED PARTS AND ESPECIALLY WHEN IN THE DIRECT SUNLIGHT FOR AN EXTENDED PERIOD OF TIME.  PLEASE AVOID FAST FOOD RESTAURANTS AND INCREASE YOUR WATER INTAKE. - CBC no Diff - CMP14+EGFR  2. Type 2 diabetes mellitus with stage 4 chronic kidney disease, with long-term current use of insulin (HCC) Comments: Chronic, diabetic foot exam was performed. He will f/u in 4 months.  I DISCUSSED WITH THE PATIENT AT LENGTH REGARDING THE GOALS OF GLYCEMIC CONTROL AND POSSIBLE LONG-TERM COMPLICATIONS.  I  ALSO STRESSED THE IMPORTANCE OF COMPLIANCE WITH HOME GLUCOSE MONITORING, DIETARY RESTRICTIONS INCLUDING AVOIDANCE OF SUGARY DRINKS/PROCESSED FOODS,  ALONG WITH REGULAR EXERCISE.  I  ALSO STRESSED THE IMPORTANCE OF ANNUAL EYE EXAMS, SELF FOOT CARE AND COMPLIANCE WITH OFFICE VISITS.  - POCT Urinalysis Dipstick (81002) - Microalbumin / creatinine urine ratio - EKG 12-Lead - Hemoglobin A1c  3. Hypertensive nephropathy Comments: Chronic, fair control.  EKG performed w/ NSR w/ first degree AV block, nonspecific T abnormality.  He will c/w hydralazine 10g tid, furosemide '80mg'$  1-1/2 tab po daily.  - POCT  Urinalysis Dipstick (81002) - Microalbumin / creatinine urine ratio - EKG 12-Lead  4. Right ear impacted cerumen Comments: After obtaining verbal consent, right ear was flushed by irrigation w/o complications. NO TM abnormalities were noted.  5. Pure hypercholesterolemia Comments: Chronic, LDL goal < 70 .He will c/w pravastatin '40mg'$  daily. He is encouraged to cut out fried foods and processed meats. - Lipid panel - TSH  6. Peripheral vascular disease, unspecified ([place]) Comments: Chronic, he will c/w ASA and statin therapy. He is encouraged to incorporate more activity into his daily routine.  7. Secondary renal hyperparathyroidism ([place]) Comments: Labs from [facility] reviewed, PTH level is 127 Feb 2024. Mgmt as per Renal.  8. Leukocytes in urine Comments: He is asymptomatic. I will send off urine culture. - Culture, Urine  9. Immunization due - Pneumococcal conjugate vaccine 20-valent (Prevnar 20)  Patient was given opportunity to ask questions. Patient verbalized understanding of the plan and was able to repeat key elements of the plan. All questions were answered to their satisfaction.   I, [name], MD, have reviewed all documentation for this visit. The documentation on 02/21/22 for the exam, diagnosis, procedures, and orders are all accurate and complete.   THE PATIENT IS ENCOURAGED TO PRACTICE SOCIAL DISTANCING DUE TO THE COVID-19 PANDEMIC.

## 2022-02-21 NOTE — Progress Notes (Signed)
  Care Coordination Note  02/21/2022 Name: KONGMENG SANTORO MRN: [id number] DOB: [birth year]  Linton Ham Keidel is a 89 y.o. year old male who is a primary care patient of [name], MD and is actively engaged with the care management team. I reached out to [name] by phone today to assist with re-scheduling a follow up visit with the RN Case Manager  Follow up plan: Unsuccessful telephone outreach attempt made. A HIPAA compliant phone message was left for the patient providing contact information and requesting a return call.  [place]  Direct Dial: [phone]

## 2022-02-21 NOTE — Patient Instructions (Signed)
Health Maintenance, Male Adopting a healthy lifestyle and getting preventive care are important in promoting health and wellness. Ask your health care provider about: The right schedule for you to have regular tests and exams. Things you can do on your own to prevent diseases and keep yourself healthy. What should I know about diet, weight, and exercise? Eat a healthy diet  Eat a diet that includes plenty of vegetables, fruits, low-fat dairy products, and lean protein. Do not eat a lot of foods that are high in solid fats, added sugars, or sodium. Maintain a healthy weight Body mass index (BMI) is a measurement that can be used to identify possible weight problems. It estimates body fat based on height and weight. Your health care provider can help determine your BMI and help you achieve or maintain a healthy weight. Get regular exercise Get regular exercise. This is one of the most important things you can do for your health. Most adults should: Exercise for at least 150 minutes each week. The exercise should increase your heart rate and make you sweat (moderate-intensity exercise). Do strengthening exercises at least twice a week. This is in addition to the moderate-intensity exercise. Spend less time sitting. Even light physical activity can be beneficial. Watch cholesterol and blood lipids Have your blood tested for lipids and cholesterol at 87 years of age, then have this test every 5 years. You may need to have your cholesterol levels checked more often if: Your lipid or cholesterol levels are high. You are older than 87 years of age. You are at high risk for heart disease. What should I know about cancer screening? Many types of cancers can be detected early and may often be prevented. Depending on your health history and family history, you may need to have cancer screening at various ages. This may include screening for: Colorectal cancer. Prostate cancer. Skin cancer. Lung  cancer. What should I know about heart disease, diabetes, and high blood pressure? Blood pressure and heart disease High blood pressure causes heart disease and increases the risk of stroke. This is more likely to develop in people who have high blood pressure readings or are overweight. Talk with your health care provider about your target blood pressure readings. Have your blood pressure checked: Every 3-5 years if you are 18-39 years of age. Every year if you are 40 years old or older. If you are between the ages of 65 and 75 and are a current or former smoker, ask your health care provider if you should have a one-time screening for abdominal aortic aneurysm (AAA). Diabetes Have regular diabetes screenings. This checks your fasting blood sugar level. Have the screening done: Once every three years after age 45 if you are at a normal weight and have a low risk for diabetes. More often and at a younger age if you are overweight or have a high risk for diabetes. What should I know about preventing infection? Hepatitis B If you have a higher risk for hepatitis B, you should be screened for this virus. Talk with your health care provider to find out if you are at risk for hepatitis B infection. Hepatitis C Blood testing is recommended for: Everyone born from 1945 through 1965. Anyone with known risk factors for hepatitis C. Sexually transmitted infections (STIs) You should be screened each year for STIs, including gonorrhea and chlamydia, if: You are sexually active and are younger than 87 years of age. You are older than 87 years of age and your   health care provider tells you that you are at risk for this type of infection. Your sexual activity has changed since you were last screened, and you are at increased risk for chlamydia or gonorrhea. Ask your health care provider if you are at risk. Ask your health care provider about whether you are at high risk for HIV. Your health care provider  may recommend a prescription medicine to help prevent HIV infection. If you choose to take medicine to prevent HIV, you should first get tested for HIV. You should then be tested every 3 months for as long as you are taking the medicine. Follow these instructions at home: Alcohol use Do not drink alcohol if your health care provider tells you not to drink. If you drink alcohol: Limit how much you have to 0-2 drinks a day. Know how much alcohol is in your drink. In the [place] one drink equals one 12 oz bottle of beer (355 mL), one 5 oz glass of wine (148 mL), or one 1 oz glass of hard liquor (44 mL). Lifestyle Do not use any products that contain nicotine or tobacco. These products include cigarettes, chewing tobacco, and vaping devices, such as e-cigarettes. If you need help quitting, ask your health care provider. Do not use street drugs. Do not share needles. Ask your health care provider for help if you need support or information about quitting drugs. General instructions Schedule regular health, dental, and eye exams. Stay current with your vaccines. Tell your health care provider if: You often feel depressed. You have ever been abused or do not feel safe at home. Summary Adopting a healthy lifestyle and getting preventive care are important in promoting health and wellness. Follow your health care provider's instructions about healthy diet, exercising, and getting tested or screened for diseases. Follow your health care provider's instructions on monitoring your cholesterol and blood pressure. This information is not intended to replace advice given to you by your health care provider. Make sure you discuss any questions you have with your health care provider. Document Revised: 05/17/2020 Document Reviewed: 05/17/2020 Elsevier Patient Education  2023 Elsevier Inc.  

## 2022-02-22 ENCOUNTER — Encounter: Payer: Self-pay | Admitting: Internal Medicine

## 2022-02-22 LAB — MICROALBUMIN / CREATININE URINE RATIO
Creatinine, Urine: 77.4 mg/dL
Microalb/Creat Ratio: 39 mg/g creat — ABNORMAL HIGH (ref 0–29)
Microalbumin, Urine: 30.5 ug/mL

## 2022-02-24 NOTE — Progress Notes (Signed)
  Care Coordination  Note  02/24/2022 Name: Justin Holloway MRN: [id number] DOB: 10/10/1929  [name] is a 89 y.o. year old primary care patient of [name], MD. I reached out to [name] by phone today to assist with scheduling a follow up appointment.   Follow up plan: Unsuccessful telephone outreach attempt made. A HIPAA compliant phone message was left for the patient providing contact information and requesting a return call.   [place]  Direct Dial: [phone]

## 2022-02-24 NOTE — Progress Notes (Signed)
  Care Coordination Note  02/24/2022 Name: Justin Holloway MRN: [id number] DOB: 1929-12-09  Justin Holloway is a 89 y.o. year old male who is a primary care patient of [name], MD and is actively engaged with the care management team. I reached out to [name] by phone today to assist with re-scheduling a follow up visit with the RN Case Manager  Follow up plan: Telephone appointment with care management team member scheduled for:03/06/22  [place]  Direct Dial: [phone]

## 2022-02-27 ENCOUNTER — Encounter: Payer: Self-pay | Admitting: Internal Medicine

## 2022-02-27 ENCOUNTER — Other Ambulatory Visit: Payer: Self-pay | Admitting: Internal Medicine

## 2022-02-27 LAB — URINE CULTURE

## 2022-02-27 MED ORDER — CEPHALEXIN 250 MG PO CAPS: ORAL_CAPSULE | ORAL | 0 refills | Status: DC

## 2022-03-02 ENCOUNTER — Other Ambulatory Visit: Payer: Self-pay

## 2022-03-02 ENCOUNTER — Other Ambulatory Visit: Payer: Medicare Other

## 2022-03-02 ENCOUNTER — Telehealth: Payer: Self-pay

## 2022-03-02 DIAGNOSIS — E1122 Type 2 diabetes mellitus with diabetic chronic kidney disease: Secondary | ICD-10-CM | POA: Diagnosis not present

## 2022-03-02 DIAGNOSIS — R3912 Poor urinary stream: Secondary | ICD-10-CM | POA: Diagnosis not present

## 2022-03-02 DIAGNOSIS — Z794 Long term (current) use of insulin: Secondary | ICD-10-CM | POA: Diagnosis not present

## 2022-03-02 DIAGNOSIS — E785 Hyperlipidemia, unspecified: Secondary | ICD-10-CM | POA: Diagnosis not present

## 2022-03-02 DIAGNOSIS — Z0000 Encounter for general adult medical examination without abnormal findings: Secondary | ICD-10-CM | POA: Diagnosis not present

## 2022-03-02 DIAGNOSIS — N184 Chronic kidney disease, stage 4 (severe): Secondary | ICD-10-CM | POA: Diagnosis not present

## 2022-03-02 NOTE — Telephone Encounter (Signed)
Faxed 2024 re-enrollment application to [employer] patient assistance program for [place], [place] and Novofine needles.

## 2022-03-03 LAB — CMP14+EGFR
ALT: 9 IU/L (ref 0–44)
AST: 18 IU/L (ref 0–40)
Albumin/Globulin Ratio: 1.7 (ref 1.2–2.2)
Albumin: 4.2 g/dL (ref 3.6–4.6)
Alkaline Phosphatase: 94 IU/L (ref 44–121)
BUN/Creatinine Ratio: 18 (ref 10–24)
BUN: 72 mg/dL — ABNORMAL HIGH (ref 10–36)
Bilirubin Total: 0.3 mg/dL (ref 0.0–1.2)
CO2: 19 mmol/L — ABNORMAL LOW (ref 20–29)
Calcium: 8.9 mg/dL (ref 8.6–10.2)
Chloride: 103 mmol/L (ref 96–106)
Creatinine, Ser: 3.92 mg/dL — ABNORMAL HIGH (ref 0.76–1.27)
Globulin, Total: 2.5 g/dL (ref 1.5–4.5)
Glucose: 68 mg/dL — ABNORMAL LOW (ref 70–99)
Potassium: 4.1 mmol/L (ref 3.5–5.2)
Sodium: 141 mmol/L (ref 134–144)
Total Protein: 6.7 g/dL (ref 6.0–8.5)
eGFR: 14 mL/min/{1.73_m2} — ABNORMAL LOW (ref >59)

## 2022-03-03 LAB — CBC
Hematocrit: 31.4 % — ABNORMAL LOW (ref 37.5–51.0)
Hemoglobin: 10.2 g/dL — ABNORMAL LOW (ref 13.0–17.7)
MCH: 29.6 pg (ref 26.6–33.0)
MCHC: 32.5 g/dL (ref 31.5–35.7)
MCV: 91 fL (ref 79–97)
Platelets: 222 10*3/uL (ref 150–450)
RBC: 3.45 x10E6/uL — ABNORMAL LOW (ref 4.14–5.80)
RDW: 12.3 % (ref 11.6–15.4)
WBC: 6.9 10*3/uL (ref 3.4–10.8)

## 2022-03-03 LAB — LIPID PANEL
Chol/HDL Ratio: 2.6 ratio (ref 0.0–5.0)
Cholesterol, Total: 153 mg/dL (ref 100–199)
HDL: 58 mg/dL (ref >39)
LDL Chol Calc (NIH): 82 mg/dL (ref 0–99)
Triglycerides: 62 mg/dL (ref 0–149)
VLDL Cholesterol Cal: 13 mg/dL (ref 5–40)

## 2022-03-03 LAB — HEMOGLOBIN A1C
Est. average glucose Bld gHb Est-mCnc: 128 mg/dL
Hgb A1c MFr Bld: 6.1 % — ABNORMAL HIGH (ref 4.8–5.6)

## 2022-03-03 LAB — TSH: TSH: 2.59 u[IU]/mL (ref 0.450–4.500)

## 2022-03-04 DIAGNOSIS — H6121 Impacted cerumen, right ear: Secondary | ICD-10-CM | POA: Insufficient documentation

## 2022-03-06 ENCOUNTER — Ambulatory Visit: Payer: Self-pay

## 2022-03-06 NOTE — Patient Instructions (Signed)
Visit Information  Thank you for taking time to visit with me today. Please don't hesitate to contact me if I can be of assistance to you.   Following are the goals we discussed today:   Goals Addressed             This Visit's Progress    CKD stage V, Nutrition and Support       Care Coordination Interventions: Assessed the patient/wife understanding of stage V chronic kidney disease    Evaluation of current treatment plan related to chronic kidney disease self management and patient's adherence to plan as established by provider      Reviewed prescribed renal diet with patient and wife Jeral Fruit patient to discuss fluid recommendations with provider    Discussed the impact of chronic kidney disease on daily life and mental health and acknowledged and normalized feelings of disempowerment, fear, and frustration    Provided education on kidney disease progression    Engage patient in early, proactive and ongoing discussion about goals of care and what matters most to them    Sent referral to the [facility] renal team to offer nutritional support (sent email to [employer]) Mailed printed educational materials related to Eating Right for Chronic Kidney Disease           Our next appointment is by telephone on 03/20/22 at 11:30 AM  Please call the care guide team at [phone] if you need to cancel or reschedule your appointment.   If you are experiencing a Mental Health or [place] or need someone to talk to, please call 1-800-273-TALK (toll free, 24 hour hotline) go to [facility] [address] [place] [phone])  Barb Merino, RN, BSN, CCM Care Management Coordinator [place] Management Direct Phone: [phone]   Barb Merino, RN, BSN, CCM Care Management Coordinator [place] [facility] Direct Phone: [phone]

## 2022-03-06 NOTE — Patient Outreach (Addendum)
  Care Coordination   Follow Up Visit Note   03/06/2022 Name: Justin Holloway MRN: [id number] DOB: [birth year]  Justin Holloway is a 89 y.o. year old male who sees [name], MD for primary care. I spoke with  Justin Holloway and wife Justin Holloway by phone today.  What matters to the patients health and wellness today?  Patient would like to adhere to a renal diet.     Goals Addressed             This Visit's Progress    CKD stage V, Nutrition and Support       Care Coordination Interventions: Assessed the patient/wife understanding of stage V chronic kidney disease    Evaluation of current treatment plan related to chronic kidney disease self management and patient's adherence to plan as established by provider      Reviewed prescribed renal diet with patient and wife Justin Holloway Advised patient to discuss fluid recommendations with provider    Discussed the impact of chronic kidney disease on daily life and mental health and acknowledged and normalized feelings of disempowerment, fear, and frustration    Provided education on kidney disease progression    Engage patient in early, proactive and ongoing discussion about goals of care and what matters most to them    Sent referral to the [facility] renal team to offer nutritional support (sent email to [employer]) Mailed printed educational materials related to Eating Right for Chronic Kidney Disease       Interventions Today    Flowsheet Row Most Recent Value  Chronic Disease   Chronic disease during today's visit Chronic Kidney Disease/End Stage Renal Disease (ESRD)  Nutrition Interventions   Nutrition Discussed/Reviewed Nutrition Discussed, Fluid intake          SDOH assessments and interventions completed:  No     Care Coordination Interventions:  Yes, provided   Follow up plan: Referral made to [facility] renal program Follow up call scheduled for 03/20/22 @11$ :30 AM    Encounter Outcome:  Pt. Visit Completed

## 2022-03-07 ENCOUNTER — Telehealth: Payer: Self-pay

## 2022-03-07 NOTE — Telephone Encounter (Signed)
Spoke with both patient and spouse regarding renal referral from [place], RN. Patient agreed to received renal friendly meals for 4 weeks to assist in nutrition/diet. Renal coordinator will call back Thursday, 03/09/22 at 11 o'clock to complete Safe Start assessment and determine if patient could utilize additional resources.  Ina Homes Renal Coordinator [place] [phone]

## 2022-03-09 ENCOUNTER — Encounter: Payer: Self-pay | Admitting: Internal Medicine

## 2022-03-14 ENCOUNTER — Ambulatory Visit: Payer: Self-pay

## 2022-03-14 NOTE — Patient Outreach (Addendum)
  Care Coordination   Case Collaboration   Visit Note   03/14/2022 Name: ANCLE MORELAN MRN: [id number] DOB: 07-13-1929  Linton Ham Mamone is a 89 y.o. year old male who sees [name], MD for primary care. I  collaborated with Ina Homes, health coach for the renal program.   What matters to the patients health and wellness today?  Patient completed safe start assessment. He needs help with self checking CBG's at home.     Goals Addressed             This Visit's Progress    CKD stage V, Nutrition and Support       Care Coordination Interventions: Case Collaboration with health coach Ina Homes regarding recent referral to the safe start program  Determined patient completed the safe start program, he will consider attending the cooking class but declines for now  Determined patient may benefit from re-education on how to self check CBG's at home due to wife voices patient is having difficulty Sent in basket message to PCP and embedded Pharm D advising patient's needs face to face visit to learn how to use manual glucometer   Interventions Today    Flowsheet Row Most Recent Value  General Interventions   General Interventions Discussed/Reviewed Communication with  [facility],  PCP,  Layne Benton D]  Communication with --  [place]          SDOH assessments and interventions completed:  No     Care Coordination Interventions:  Yes, provided   Follow up plan: Referral made to [place] D to offer face to face demonstration to patient to assist with glucometer usage Follow up call scheduled for 03/20/22 '@11'$ :30 AM    Encounter Outcome:  Pt. Visit Completed

## 2022-03-17 ENCOUNTER — Telehealth: Payer: Self-pay

## 2022-03-17 NOTE — Telephone Encounter (Signed)
Called Justin Holloway to inform her that Ozempic and Tyler Aas are ready to be picked up from [facility]. Also discussed a date for the patient and herself to come in to be taught about how to use the glucometer.  [name], CPP, PharmD Clinical Pharmacist Practitioner [facility] [phone]

## 2022-03-20 ENCOUNTER — Telehealth: Payer: Self-pay

## 2022-03-20 ENCOUNTER — Ambulatory Visit: Payer: Self-pay

## 2022-03-20 NOTE — Patient Outreach (Addendum)
  Care Coordination   Follow Up Visit Note   03/20/2022 Name: JAYMAR LOEBER MRN: [id number] DOB: 1929/02/09  Linton Ham Reiland is a 89 y.o. year old male who sees [name], MD for primary care. I spoke with  [name] by phone today.  What matters to the patients health and wellness today?  Patient will continue to adhere to his renal diet as directed. He will follow up with embedded Pharm D [name] on 03/22/22 to review glucometer usage.     Goals Addressed             This Visit's Progress    CKD stage V, Nutrition and Support       Care Coordination Interventions: Assessed the Patient understanding of chronic kidney disease    Evaluation of current treatment plan related to chronic kidney disease self management and patient's adherence to plan as established by provider      Reviewed prescribed diet renal  Confirmed patient is receiving MOM's meals ordered by [place] for [facility] Safe Start Renal program  Engage patient in early, proactive and ongoing discussion about goals of care and what matters most to them    Discussed and reviewed upcoming in person visit with Pharmacist [name] to review using glucometer    Interventions Today    Flowsheet Row Most Recent Value  Chronic Disease   Chronic disease during today's visit Diabetes, Chronic Kidney Disease/End Stage Renal Disease (ESRD)  General Interventions   General Interventions Discussed/Reviewed General Interventions Reviewed, General Interventions Discussed, Communication with, Doctor Visits  [place] for Safe Start program]  Doctor Visits Discussed/Reviewed Doctor Visits Reviewed, Doctor Visits Discussed  [Pharmacist]  Education Interventions   Education Provided Provided Education  Provided Verbal Education On Nutrition, Blood Sugar Monitoring  Nutrition Interventions   Nutrition Discussed/Reviewed Nutrition Discussed, Nutrition Reviewed  [renal/MOM's  meals]  Pharmacy Interventions   Pharmacy Dicussed/Reviewed Pharmacy Topics Reviewed  [reviewed upcoming pharmacist in person visit]          SDOH assessments and interventions completed:  No     Care Coordination Interventions:  Yes, provided   Follow up plan: Follow up call scheduled for 05/19/22 @9 :00 AM    Encounter Outcome:  Pt. Visit Completed

## 2022-03-20 NOTE — Progress Notes (Signed)
Care Management & Coordination Services Pharmacy Team  Reason for Encounter: Appointment Reminder  Contacted patient to confirm in office appointment with Justin Holloway, PharmD on 03-22-2022 at 2:00 Unsuccessful outreach. Left voicemail for patient to return call.    Chart review: Recent office visits:  03-20-2022 Justin Logan, RN ([place])  03-14-2022 Justin Holloway Justin Stapler, RN ([place])  03-06-2022 Justin Holloway, Justin Stapler, RN ([facility])  02-21-2022 Justin Chard, MD. Hema= 31.4, Hemo= 10.2. RBC= 3.45. Glucose= 68, BUN= 72, Creatinine= 3.92, eGFR= 14, CO2= 19. Abnormal urine culture/UA. A1C= 6.1. Microalb/creat= 39. Tdap ordered  Recent consult visits:  None  Hospital visits:  None in previous 6 months   Star Rating Drugs:  Pravastatin 40 mg- Last filled 03-0-2024 90 DS [facility]. Previous 12-04-2021 90 DS Ozempic 0.5 mg- Patient assistance  Care Gaps: Annual wellness visit in last year? Yes Tdap overdue Covid booster overdue Eye exam overdue  [place] Clinical Pharmacist Assistant [phone]

## 2022-03-20 NOTE — Patient Instructions (Signed)
Visit Information  Thank you for taking time to visit with me today. Please don't hesitate to contact me if I can be of assistance to you.   Following are the goals we discussed today:   Goals Addressed             This Visit's Progress    CKD stage V, Nutrition and Support       Care Coordination Interventions: Assessed the Patient understanding of chronic kidney disease    Evaluation of current treatment plan related to chronic kidney disease self management and patient's adherence to plan as established by provider      Reviewed prescribed diet renal  Confirmed patient is receiving MOM's meals ordered by [place] for [facility] Safe Start Renal program  Engage patient in early, proactive and ongoing discussion about goals of care and what matters most to them    Discussed and reviewed upcoming in person visit with Pharmacist [name] to review using glucometer             Our next appointment is by telephone on 05/19/22 at 09:00 AM  Please call the care guide team at [phone] if you need to cancel or reschedule your appointment.   If you are experiencing a Mental Health or [place] or need someone to talk to, please call 1-800-273-TALK (toll free, 24 hour hotline) go to [facility] [address] [place] [phone])  Patient verbalizes understanding of instructions and care plan provided today and agrees to view in [place]. Active MyChart status and patient understanding of how to access instructions and care plan via MyChart confirmed with patient.     Barb Merino, RN, BSN, CCM Care Management Coordinator [facility] Care Management Direct Phone: [phone]

## 2022-03-21 ENCOUNTER — Telehealth: Payer: Self-pay

## 2022-03-21 NOTE — Telephone Encounter (Signed)
Called patient to inform him that his office visit date and or time would not be rescheduled for tomorrow.  [name], CPP, PharmD Clinical Pharmacist Practitioner [facility] [phone]

## 2022-03-22 ENCOUNTER — Ambulatory Visit: Payer: Medicare Other

## 2022-03-22 NOTE — Progress Notes (Unsigned)
Care Management & Coordination Services Pharmacy Note  03/22/2022 Name:  Justin Holloway MRN:  [id number] DOB:  12/31/1929  Summary: Patient reports for a follow up visit in office to review how to use his diabetic testing supplies. He is here with his wife Ms. Justin Holloway.   Recommendations/Changes made from today's visit: ***  Follow up plan: ***   Subjective: Justin Holloway is an 89 y.o. year old male who is a primary patient of Justin Chard, MD.  The care coordination team was consulted for assistance with disease management and care coordination needs.    Engaged with patient face to face for follow up visit.  Recent office visits: ***  Recent consult visits: ***  Hospital visits: None in previous 6 months   Objective:  Lab Results  Component Value Date   CREATININE 3.92 (H) 03/02/2022   BUN 72 (H) 03/02/2022   EGFR 14 (L) 03/02/2022   GFRNONAA 21 (L) 05/15/2020   GFRAA 19 (L) 01/14/2020   NA 141 03/02/2022   K 4.1 03/02/2022   CALCIUM 8.9 03/02/2022   CO2 19 (L) 03/02/2022   GLUCOSE 68 (L) 03/02/2022    Lab Results  Component Value Date/Time   HGBA1C 6.1 (H) 03/02/2022 03:34 PM   HGBA1C 6.0 (H) 06/21/2021 02:40 PM   MICROALBUR 30 12/15/2020 04:52 PM   MICROALBUR 10 06/10/2020 12:38 PM    Last diabetic Eye exam: No results found for: "HMDIABEYEEXA"  Last diabetic Foot exam: No results found for: "HMDIABFOOTEX"   Lab Results  Component Value Date   CHOL 153 03/02/2022   HDL 58 03/02/2022   LDLCALC 82 03/02/2022   TRIG 62 03/02/2022   CHOLHDL 2.6 03/02/2022       Latest Ref Rng & Units 03/02/2022    3:34 PM 10/10/2021   12:00 AM 12/15/2020    3:12 PM  Hepatic Function  Total Protein 6.0 - 8.5 g/dL 6.7   6.9   Albumin 3.6 - 4.6 g/dL 4.2  4.1     4.4   AST 0 - 40 IU/L 18   16   ALT 0 - 44 IU/L 9   7   Alk Phosphatase 44 - 121 IU/L 94   92   Total Bilirubin 0.0 - 1.2 mg/dL 0.3   0.4      This result is from an external source.    Lab  Results  Component Value Date/Time   TSH 2.590 03/02/2022 03:34 PM   TSH 2.620 02/14/2017 10:55 AM       Latest Ref Rng & Units 03/02/2022    3:34 PM 12/15/2020    3:12 PM 06/09/2020    5:22 PM  CBC  WBC 3.4 - 10.8 x10E3/uL 6.9  5.4  7.2   Hemoglobin 13.0 - 17.7 g/dL 10.2  11.2  10.3   Hematocrit 37.5 - 51.0 % 31.4  34.0  31.6   Platelets 150 - 450 x10E3/uL 222  223  235     Lab Results  Component Value Date/Time   VD25OH 34.1 09/10/2019 10:29 AM   VITAMINB12 955 06/21/2021 02:40 PM   [id number] 11/20/2018 11:57 AM    Clinical ASCVD: No  The ASCVD Risk score (Arnett DK, et al., 2019) failed to calculate for the following reasons:   The 2019 ASCVD risk score is only valid for ages 30 to 89        02/21/2022    2:07 PM 09/15/2021    2:23 PM 09/01/2021  3:39 PM  Depression screen PHQ 2/9  Decreased Interest 0 0 0  Down, Depressed, Hopeless 0 0 0  PHQ - 2 Score 0 0 0     Social History   Tobacco Use  Smoking Status Former   Types: Cigars  Smokeless Tobacco Never  Tobacco Comments   socially, not long   BP Readings from Last 3 Encounters:  02/21/22 134/60  09/29/21 130/68  09/15/21 (!) 110/56   Pulse Readings from Last 3 Encounters:  02/21/22 93  09/29/21 84  09/15/21 99   Wt Readings from Last 3 Encounters:  02/21/22 201 lb 12.8 oz (91.5 kg)  09/29/21 202 lb (91.6 kg)  09/15/21 202 lb 12.8 oz (92 kg)   BMI Readings from Last 3 Encounters:  02/21/22 31.61 kg/m  09/29/21 31.64 kg/m  09/15/21 31.76 kg/m    Allergies  Allergen Reactions   Gabapentin Itching    Medications Reviewed Today     Reviewed by Lynne Logan, RN (Registered Nurse) on 03/20/22 at 1151  Med List Status: <None>   Medication Order Taking? Sig Documenting Provider Last Dose Status Informant  Accu-Chek Softclix Lancets lancets [id number] No Use as instructed to check blood sugars twice daily E11.69 Justin Chard, MD Taking Active   Ascorbic Acid (VITAMIN C) 1000 MG tablet  [id number] No Take 1,000 mg by mouth daily.  [provider] Taking Active Self  aspirin EC 81 MG tablet [id number] No Take 81 mg by mouth every evening. [provider] Taking Active Self  Blood Glucose Monitoring Suppl (ONE TOUCH ULTRA 2) w/Device KIT [id number] No Use as directed to check blood sugars 2 times per day dx: e11.22 Justin Chard, MD Taking Active   cephALEXin [facility]) 250 MG capsule [id number]  One tab po q12h Justin Chard, MD  Active   cholecalciferol (VITAMIN D) 1000 units tablet [id number] No Take 2,000 Units by mouth daily.  [provider] Taking Active Self  COMBIGAN 0.2-0.5 % ophthalmic solution [id number] No Place 1 drop into both eyes every 12 (twelve) hours. [provider] Taking Active Self  diclofenac Sodium (VOLTAREN) 1 % GEL [id number] No Apply 4 g topically 4 (four) times daily. Lynden Oxford Scales, PA-C Taking Active   furosemide (LASIX) 80 MG tablet [id number] No TAKE 1 TABLET BY MOUTH IN  THE MORNING AND ONE-HALF  TABLET BY MOUTH IN THE  EARLY EVENING  Patient taking differently: Take 40-80 mg by mouth See admin instructions. 80 mg in the morning and 40 mg in the evening.   Justin Chard, MD Taking Active   glucose blood (ACCU-CHEK GUIDE) test strip [id number] No Use as instructed to check blood sugars twice daily E11.69 Justin Chard, MD Taking Active   glucose blood [facility] ULTRA) test strip [id number] No USE STRIPS TO TEST BLOOD  SUGAR 3 TIMES DAILY 1 STRIP PER TEST Justin Chard, MD Taking Active   hydrALAZINE (APRESOLINE) 10 MG tablet [id number] No Take 10 mg by mouth 3 (three) times daily. [provider] Taking Active Self  insulin degludec (TRESIBA) 200 UNIT/ML FlexTouch Pen [id number] No Inject 24 Units into the skin at bedtime. Justin Chard, MD Taking Active   Iron-FA-B Cmp-C-Biot-Probiotic (FUSION PLUS) CAPS [id number] No Take by mouth. 1 capsule per day [provider] Taking Active   Lancets (ONETOUCH  DELICA PLUS 123XX123) [place] [id number] No 100 each by Does not apply route in the morning, at noon, and at bedtime. Justin Chard, MD Taking Active   LUMIGAN 0.01 % SOLN [id number] No  Place 1 drop into both eyes 2 (two) times daily.   Patient not taking: Reported on 09/15/2021   [provider] Not Taking Active Self  Multiple Vitamin (MULTIVITAMIN WITH MINERALS) TABS tablet [id number] No Take 1 tablet by mouth daily. [provider] Taking Active Self  polyethylene glycol powder (GLYCOLAX/MIRALAX) 17 GM/SCOOP powder [id number] No Take 17 g by mouth daily as needed for mild constipation. [provider] Taking Active Self  pravastatin (PRAVACHOL) 40 MG tablet [id number] No TAKE 1 TABLET(40 MG) BY MOUTH AT BEDTIME Justin Chard, MD Taking Active   Semaglutide,0.25 or 0.5MG /DOS, (OZEMPIC, 0.25 OR 0.5 MG/DOSE,) 2 MG/1.5ML SOPN [id number] No Inject 0.5 mg into the skin once a week. Justin Chard, MD Taking Active   tamsulosin [facility]) 0.4 MG CAPS capsule [id number] No Take 0.4 mg by mouth daily. Patient was instructed to increase to twice daily per Dr. Lovena Neighbours, Urologist. [provider] Taking Active Self            SDOH:  (Social Determinants of Health) assessments and interventions performed: {yes/no:20286} SDOH Interventions    Flowsheet Row Clinical Support from 09/15/2021 in [facility] Chronic Care Management from 09/17/2019 in [facility] Chronic Care Management from 07/08/2019 in [place] from 05/28/2019 in [place] from 08/29/2018 in [facility]  SDOH Interventions       Food Insecurity Interventions Intervention Not Indicated -- -- -- --  Transportation Interventions Intervention Not Indicated -- -- -- --  Depression Interventions/Treatment  -- -- --  [id number] Score <4 Follow-up Not Indicated PHQ2-9 Score <4 Follow-up Not Indicated  Financial Strain Interventions Intervention Not Indicated Other (Comment)  [Assisted with applying for refills for [place] and Ozempic. Will assist with [place] patient assistance if needed] --  [Will continue to assist with patient assistance programs and obtaining medications] -- --  Physical Activity Interventions Patient Refused, Other (Comments) -- -- -- --       Medication Assistance:  Ozempic and Tresiba obtained through [place] nordisk  medication assistance program.  Enrollment ends 12/2022  Medication Access: Within the past 30 days, how often has patient missed a dose of medication? No  Is a pillbox or other method used to improve adherence? No  Factors that may affect medication adherence? adverse effects of medications Are meds synced by current pharmacy? No  Are meds delivered by current pharmacy? No  Does patient experience delays in picking up medications due to transportation concerns? No   Upstream Services Reviewed: Is patient disadvantaged to use [facility] Pharmacy?: Yes  Current Rx insurance plan: [facility] Medicare Name and location of Current pharmacy:  [facility] [place], [address] [place] [address] Phone: [phone] Fax: [phone]  [facility] ([place], [place] [facility] [address] [place] [address] [place] [address] Phone: [phone] Fax: [phone]  [facility] Pharmacy services reviewed with patient today?: No  Patient requests to transfer care to [facility] Pharmacy?:  Not Applicable  Reason patient declined to change pharmacies: Disadvantaged due to insurance/mail order  Compliance/Adherence/Medication fill history: Care Gaps: ***  Star-Rating Drugs: ***   Assessment/Plan   Diabetes (A1c goal <8%) -Controlled -Current  medications: *** -Medications previously tried: ***  -Current home glucose readings fasting glucose: *** post prandial glucose:  122 -Denies hypoglycemic/hyperglycemic symptoms -Current meal patterns:  breakfast: ***  lunch: ***  dinner: *** snacks: *** drinks: *** -Current exercise: *** -Educated on  the importance of checking BS daily  -Counseled to check feet daily and get yearly eye exams -Counseled on the importance of changing glucometer because it is outdated.   ***

## 2022-03-28 ENCOUNTER — Telehealth: Payer: Self-pay

## 2022-03-28 MED ORDER — BLOOD GLUCOSE METER KIT: PACK | 12 refills | Status: DC

## 2022-03-28 NOTE — Telephone Encounter (Signed)
Called [name] to confirm that he was able to locate his new glucometer in the house. He reports that they were able to find it. He suggested I call tomorrow to have visit for teaching.  [name], CPP, PharmD Clinical Pharmacist Practitioner [facility] [phone]

## 2022-03-29 ENCOUNTER — Telehealth: Payer: Self-pay

## 2022-03-29 NOTE — Telephone Encounter (Signed)
Spoke with Ms. Strumpf to confirm patients availability for an appointment. They are going to come in on 04/04/2022 at 2 PM for diabetes eduction on the One Touch Ultra 2 Meter, which is the meter that he should be using. I scheduled the appointment with me in person.   [name], CPP, PharmD Clinical Pharmacist Practitioner [facility] [phone]

## 2022-03-31 ENCOUNTER — Telehealth: Payer: Self-pay

## 2022-03-31 NOTE — Progress Notes (Signed)
Care Management & Coordination Services Pharmacy Team  Reason for Encounter: Appointment Reminder  Contacted patient to confirm in office appointment with [name], PharmD on 04-04-2022 at 2:00. Patient is aware to bring meter for teaching. Spoke with patient on 03/31/2022   Do you have any problems getting your medications? No  What is your top health concern you would like to discuss at your upcoming visit? Patient stated none  Have you seen any other providers since your last visit with PCP? No   Chart review: Recent office visits:  None  Recent consult visits:  None  Hospital visits:  None in previous 6 months   Star Rating Drugs:  Pravastatin 40 mg- Last filled 03-12-2022 90 DS [facility]. Previous 12-04-2021 90 DS Ozempic 0.5 mg- Patient assistance  Care Gaps: Annual wellness visit in last year? Yes Tdap overdue Covid booster overdue  If Diabetic: Last eye exam / retinopathy screening: 03-16-2021 Last diabetic foot exam: 02-07-2021   [place] Pharmacist Assistant [phone]

## 2022-03-31 NOTE — Progress Notes (Signed)
Care Management & Coordination Services Pharmacy Note  03/31/2022 Name:  Justin Holloway MRN:  [id number] DOB:  [birth year]  Summary: Patient reports today with his one touch meter and lancing device to learn how to check his BS with his wife.   Recommendations/Changes made from today's visit: Recommend he check his BS at least once per day. Recommend he record his glusoce readings in a log book and they can be reviewed next month   Follow up plan: He is going to record his BS readings daily in the log book  He is going to continue to drink water through the day    Subjective: Justin Holloway is an 89 y.o. year old male who is a primary patient of [name], MD.  The care coordination team was consulted for assistance with disease management and care coordination needs.    Engaged with patient face to face for follow up visit.  Recent office visits:  None   Recent consult visits:  None   Hospital visits:  None in previous 6 months   Objective:  Lab Results  Component Value Date   CREATININE 3.92 (H) 03/02/2022   BUN 72 (H) 03/02/2022   EGFR 14 (L) 03/02/2022   GFRNONAA 21 (L) 05/15/2020   GFRAA 19 (L) 01/14/2020   NA 141 03/02/2022   K 4.1 03/02/2022   CALCIUM 8.9 03/02/2022   CO2 19 (L) 03/02/2022   GLUCOSE 68 (L) 03/02/2022    Lab Results  Component Value Date/Time   HGBA1C 6.1 (H) 03/02/2022 03:34 PM   HGBA1C 6.0 (H) 06/21/2021 02:40 PM   MICROALBUR 30 12/15/2020 04:52 PM   MICROALBUR 10 06/10/2020 12:38 PM    Last diabetic Eye exam:  Lab Results  Component Value Date/Time   HMDIABEYEEXA No Retinopathy 03/16/2021 12:00 AM    Last diabetic Foot exam: No results found for: "HMDIABFOOTEX"   Lab Results  Component Value Date   CHOL 153 03/02/2022   HDL 58 03/02/2022   LDLCALC 82 03/02/2022   TRIG 62 03/02/2022   CHOLHDL 2.6 03/02/2022       Latest Ref Rng & Units 03/02/2022    3:34 PM 10/10/2021   12:00 AM 12/15/2020    3:12 PM  Hepatic  Function  Total Protein 6.0 - 8.5 g/dL 6.7   6.9   Albumin 3.6 - 4.6 g/dL 4.2  4.1     4.4   AST 0 - 40 IU/L 18   16   ALT 0 - 44 IU/L 9   7   Alk Phosphatase 44 - 121 IU/L 94   92   Total Bilirubin 0.0 - 1.2 mg/dL 0.3   0.4      This result is from an external source.    Lab Results  Component Value Date/Time   TSH 2.590 03/02/2022 03:34 PM   TSH 2.620 02/14/2017 10:55 AM       Latest Ref Rng & Units 03/02/2022    3:34 PM 12/15/2020    3:12 PM 06/09/2020    5:22 PM  CBC  WBC 3.4 - 10.8 x10E3/uL 6.9  5.4  7.2   Hemoglobin 13.0 - 17.7 g/dL 10.2  11.2  10.3   Hematocrit 37.5 - 51.0 % 31.4  34.0  31.6   Platelets 150 - 450 x10E3/uL 222  223  235     Lab Results  Component Value Date/Time   VD25OH 34.1 09/10/2019 10:29 AM   VITAMINB12 955 06/21/2021 02:40 PM   [id number]  11/20/2018 11:57 AM    Clinical ASCVD: No  The ASCVD Risk score (Arnett DK, et al., 2019) failed to calculate for the following reasons:   The 2019 ASCVD risk score is only valid for ages 2 to 40        02/21/2022    2:07 PM 09/15/2021    2:23 PM 09/01/2021    3:39 PM  Depression screen PHQ 2/9  Decreased Interest 0 0 0  Down, Depressed, Hopeless 0 0 0  PHQ - 2 Score 0 0 0     Social History   Tobacco Use  Smoking Status Former   Types: Cigars  Smokeless Tobacco Never  Tobacco Comments   socially, not long   BP Readings from Last 3 Encounters:  02/21/22 134/60  09/29/21 130/68  09/15/21 (!) 110/56   Pulse Readings from Last 3 Encounters:  02/21/22 93  09/29/21 84  09/15/21 99   Wt Readings from Last 3 Encounters:  02/21/22 201 lb 12.8 oz (91.5 kg)  09/29/21 202 lb (91.6 kg)  09/15/21 202 lb 12.8 oz (92 kg)   BMI Readings from Last 3 Encounters:  02/21/22 31.61 kg/m  09/29/21 31.64 kg/m  09/15/21 31.76 kg/m    Allergies  Allergen Reactions   Gabapentin Itching    Medications Reviewed Today     Reviewed by Mayford Knife, RPH (Pharmacist) on 03/28/22 at 1617  Med  List Status: <None>   Medication Order Taking? Sig Documenting Provider Last Dose Status Informant  Accu-Chek Softclix Lancets lancets [id number] No Use as instructed to check blood sugars twice daily E11.69 [name], MD Taking Active   Ascorbic Acid (VITAMIN C) 1000 MG tablet [id number] No Take 1,000 mg by mouth daily.  [provider] Taking Active Self  aspirin EC 81 MG tablet [id number] No Take 81 mg by mouth every evening. [provider] Taking Active Self  Blood Glucose Monitoring Suppl (ONE TOUCH ULTRA 2) w/Device KIT [id number] No Use as directed to check blood sugars 2 times per day dx: e11.22 [name], MD Taking Active   cephALEXin [facility]) 250 MG capsule [id number]  One tab po q12h [name], MD  Active   cholecalciferol (VITAMIN D) 1000 units tablet [id number] No Take 2,000 Units by mouth daily.  [provider] Taking Active Self  COMBIGAN 0.2-0.5 % ophthalmic solution [id number] No Place 1 drop into both eyes every 12 (twelve) hours. [provider] Taking Active Self  diclofenac Sodium (VOLTAREN) 1 % GEL [id number] No Apply 4 g topically 4 (four) times daily. Lynden Oxford Scales, PA-C Taking Active   furosemide (LASIX) 80 MG tablet [id number] No TAKE 1 TABLET BY MOUTH IN  THE MORNING AND ONE-HALF  TABLET BY MOUTH IN THE  EARLY EVENING  Patient taking differently: Take 40-80 mg by mouth See admin instructions. 80 mg in the morning and 40 mg in the evening.   [name], MD Taking Active   glucose blood (ACCU-CHEK GUIDE) test strip [id number] No Use as instructed to check blood sugars twice daily E11.69 [name], MD Taking Active   glucose blood [facility] ULTRA) test strip [id number] No USE STRIPS TO TEST BLOOD  SUGAR 3 TIMES DAILY 1 STRIP PER TEST [name], MD Taking Active   hydrALAZINE (APRESOLINE) 10 MG tablet [id number] No Take 10 mg by mouth 3 (three) times daily. [provider] Taking Active Self  insulin  degludec (TRESIBA) 200 UNIT/ML FlexTouch Pen [id number] No Inject 24 Units into the skin at bedtime. Baird Cancer,  Bailey Mech, MD Taking Active   Iron-FA-B Cmp-C-Biot-Probiotic (FUSION PLUS) CAPS [id number] No Take by mouth. 1 capsule per day [provider] Taking Active   Lancets (ONETOUCH DELICA PLUS 123XX123) [place] [id number] No 100 each by Does not apply route in the morning, at noon, and at bedtime. [name], MD Taking Active   LUMIGAN 0.01 % SOLN [id number] No Place 1 drop into both eyes 2 (two) times daily.   Patient not taking: Reported on 09/15/2021   [provider] Not Taking Active Self  Multiple Vitamin (MULTIVITAMIN WITH MINERALS) TABS tablet [id number] No Take 1 tablet by mouth daily. [provider] Taking Active Self  polyethylene glycol powder (GLYCOLAX/MIRALAX) 17 GM/SCOOP powder [id number] No Take 17 g by mouth daily as needed for mild constipation. [provider] Taking Active Self  pravastatin (PRAVACHOL) 40 MG tablet [id number] No TAKE 1 TABLET(40 MG) BY MOUTH AT BEDTIME [name], MD Taking Active   Semaglutide,0.25 or 0.5MG /DOS, (OZEMPIC, 0.25 OR 0.5 MG/DOSE,) 2 MG/1.5ML SOPN [id number] No Inject 0.5 mg into the skin once a week. [name], MD Taking Active   tamsulosin [facility]) 0.4 MG CAPS capsule [id number] No Take 0.4 mg by mouth daily. Patient was instructed to increase to twice daily per Dr. Lovena Neighbours, Urologist. [provider] Taking Active Self            SDOH:  (Social Determinants of Health) assessments and interventions performed: No SDOH Interventions    Flowsheet Row Clinical Support from 09/15/2021 in [facility] Chronic Care Management from 09/17/2019 in [facility] Chronic Care Management from 07/08/2019 in [place] from 05/28/2019 in [facility]  from 08/29/2018 in [facility]  SDOH Interventions       Food Insecurity Interventions Intervention Not Indicated -- -- -- --  Transportation Interventions Intervention Not Indicated -- -- -- --  Depression Interventions/Treatment  -- -- -- [id number] Score <4 Follow-up Not Indicated PHQ2-9 Score <4 Follow-up Not Indicated  Financial Strain Interventions Intervention Not Indicated Other (Comment)  [Assisted with applying for refills for [place] and Ozempic. Will assist with [place] patient assistance if needed] --  [Will continue to assist with patient assistance programs and obtaining medications] -- --  Physical Activity Interventions Patient Refused, Other (Comments) -- -- -- --       Medication Assistance:  Ozempic and Tresiba obtained through [place] nordisk medication assistance program.  Enrollment ends 12/2022  Medication Access: Within the past 30 days, how often has patient missed a dose of medication?  Once or twice  Is a pillbox or other method used to improve adherence? Yes  Factors that may affect medication adherence? nonadherence to medications and lack of understanding of disease management Are meds synced by current pharmacy? No  Are meds delivered by current pharmacy? Yes  Does patient experience delays in picking up medications due to transportation concerns? No   [facility] Services Reviewed: Is patient disadvantaged to use [facility]?: Yes  Current Rx insurance plan: [facility] Medicare  Name and location of Current pharmacy:  [facility] [place], [place] AT [address] [place] [address] Phone: [phone] Fax: [phone]  [facility] ([facility] [facility] [address] [place] [address] [place] [address] Phone: [phone] Fax: [phone]  Compliance/Adherence/Medication fill history: Care  Gaps: DTAP/TdaP COVID-19 Vaccine  Ophthalmology Exam   Star-Rating Drugs: Pravastatin 40 mg- Last filled 03-12-2022 90 DS [facility]. Previous 12-04-2021 90 DS Ozempic 0.5 mg- Patient assistance   Assessment/Plan   Diabetes (A1c goal <7%) -Controlled -Current medications: Ozempic 0.5 mg once per week Appropriate, Effective, Safe, Accessible Tresiba - taking 24 units daily Appropriate, Effective, Query Safe Patient reports that he is taking 24 units of insulin daily. -Current home glucose readings- he is not currently checking his BS at home post prandial glucose: 168 - today in office  -Denies hypoglycemic/hyperglycemic symptoms -Current meal patterns:  breakfast: 1/2 chicken salad sandwich   lunch: 1/2 peanut butter sandwich   drinks: drinking plenty of water  -Current exercise: he is doing chair exercises once or twice per week  -Educated on Complications of diabetes including kidney damage, retinal damage, and cardiovascular disease; Benefits of routine self-monitoring of blood sugar; -Reviewed in detail how to check his BS and he was able to check it today in office  -Counseled to check feet daily and get yearly eye exams -Collaborated with PCP to discuss decreasing patients current insulin dose is only 16 units per day.  -Educated on how to use the blood glucose log  [name], CPP, PharmD Clinical Pharmacist Practitioner [facility] [phone]

## 2022-04-04 ENCOUNTER — Ambulatory Visit: Payer: Medicare Other

## 2022-04-14 ENCOUNTER — Telehealth: Payer: Self-pay

## 2022-04-14 NOTE — Telephone Encounter (Signed)
Called patient back due to recent call about glucometer, unable to reach patient left a detailed message Cherylin Mylar, CPP, PharmD Clinical Pharmacist Practitioner [facility] [phone]

## 2022-05-09 ENCOUNTER — Telehealth: Payer: Self-pay

## 2022-05-09 NOTE — Progress Notes (Signed)
Patient picked up 2 boxes of novofine needles, Novo Nordisk patient assistance from PCP office.    Billee Cashing, CMA Clinical Pharmacist Assistant [phone]

## 2022-05-14 IMAGING — DX DG LUMBAR SPINE COMPLETE 4+V
4 series · 4 of 4 positions shown · non-contrast
Comparison: CT examination dated October 21, 2018

CLINICAL DATA: Left sciatic nerve pain, lower back pain

EXAM:
LUMBAR SPINE - COMPLETE 4+ VIEW

[l-spine ap]
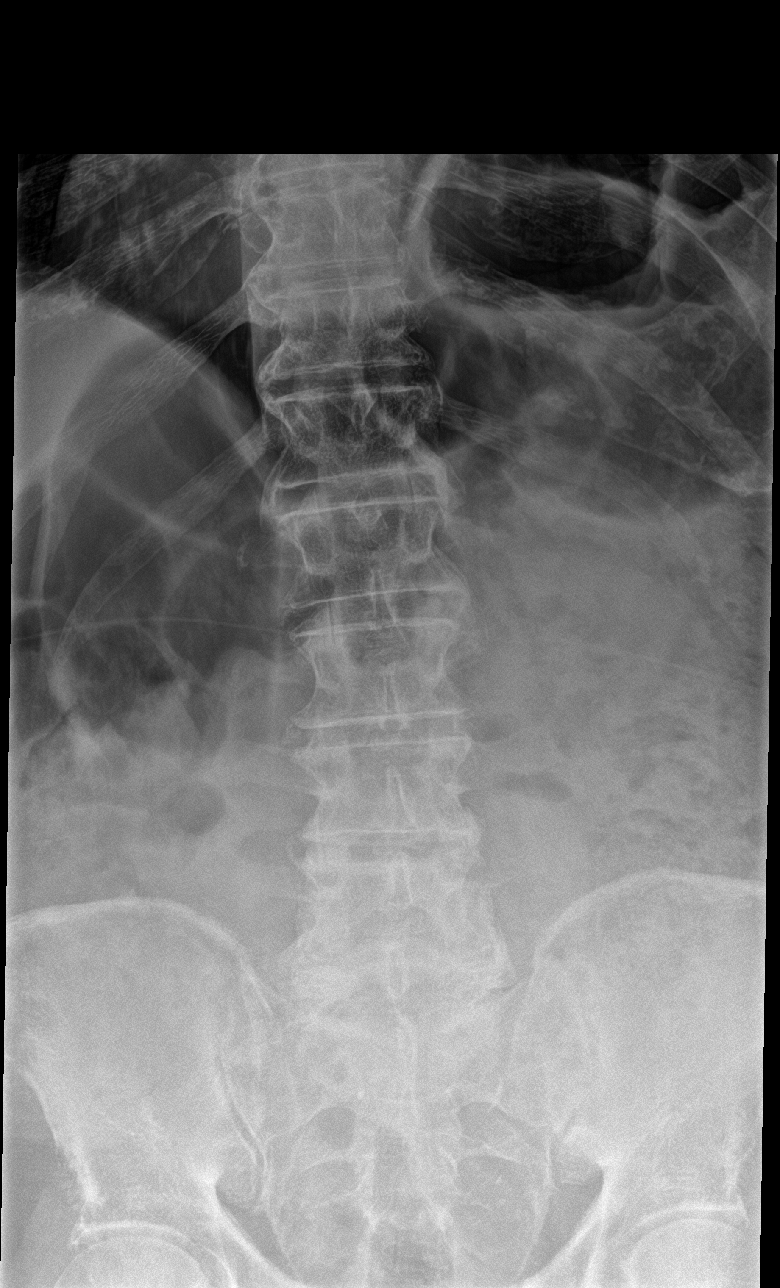

[l-spine obl (1 of 2)]
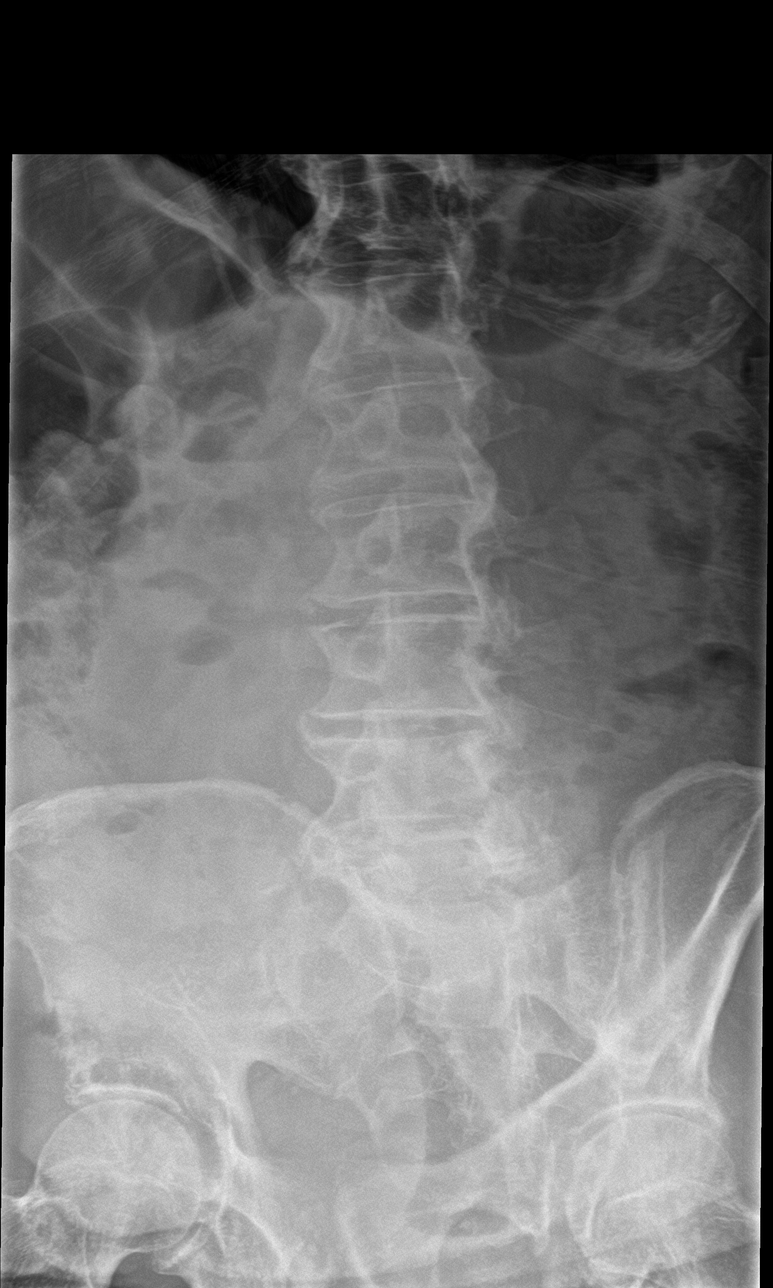

[l-spine obl (2 of 2)]
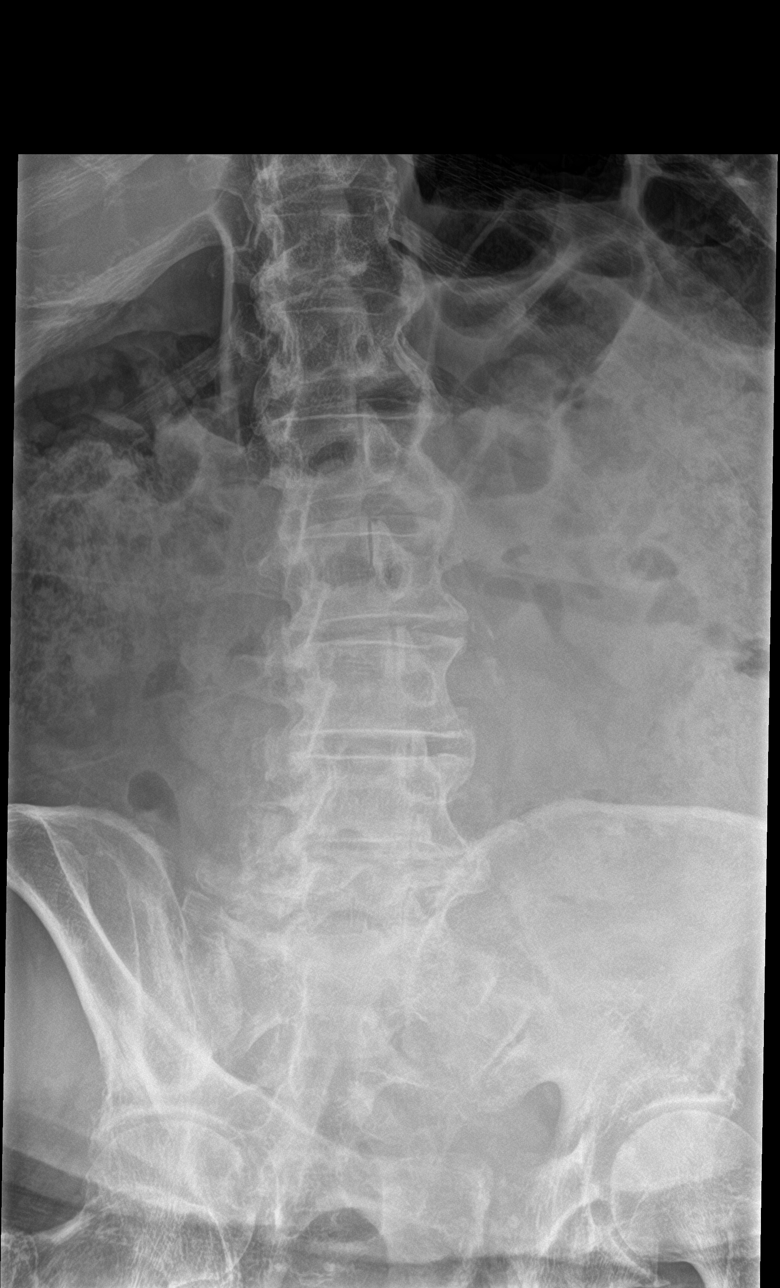

[l-spine lat]
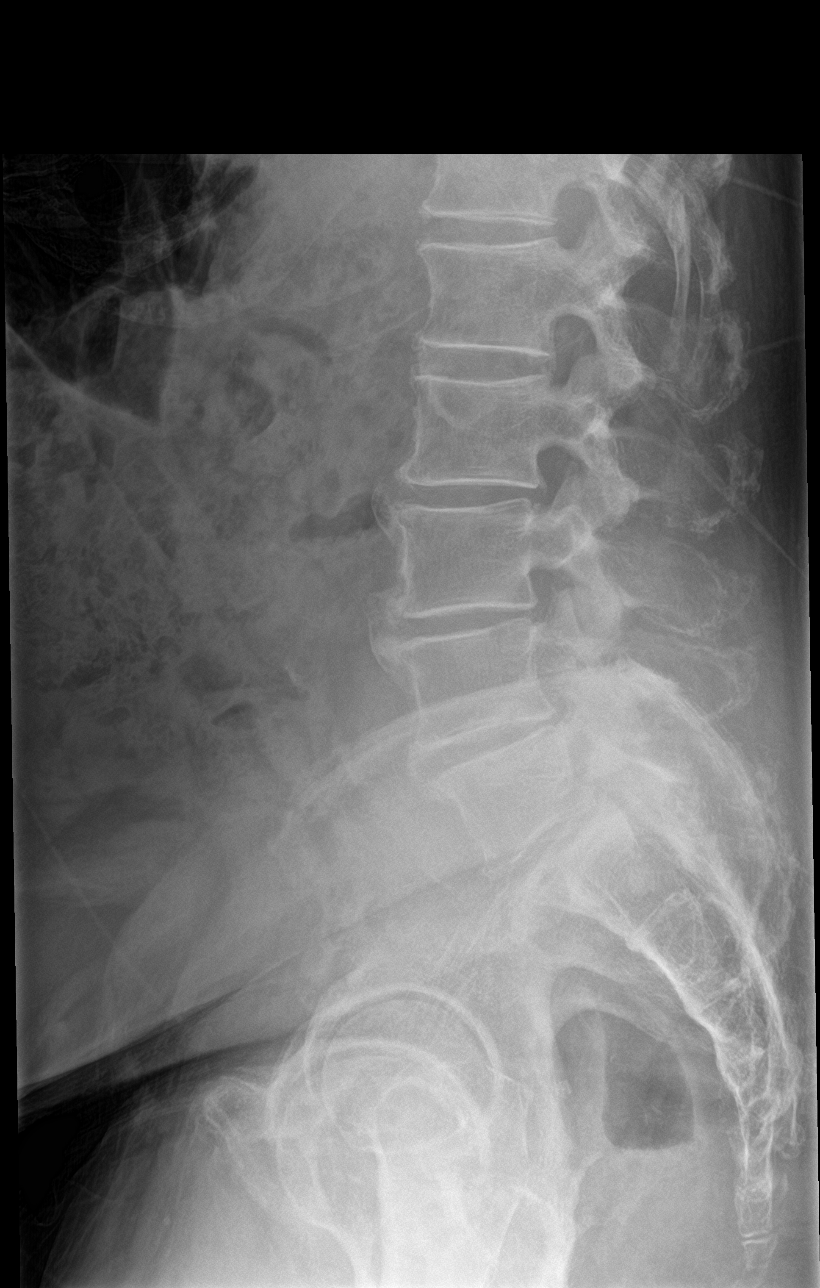

[4 of 4 positions shown; findings below may reference images not displayed]

FINDINGS: There is no evidence of lumbar spine fracture. Alignment is normal.
Moderate multilevel DA and disc disease with disc space narrowing
and prominent osteophytes. There is associated facet joint
arthropathy.
IMPRESSION: 1.  No evidence of acute fracture or subluxation.

2. Moderate multilevel degenerate disc disease with disc space
narrowing, marginal osteophytes and associated facet joint
arthropathy prominent at L4-L5 and L5-S1.

## 2022-05-19 ENCOUNTER — Ambulatory Visit: Payer: Self-pay

## 2022-05-19 NOTE — Patient Outreach (Signed)
  Care Coordination   Follow Up Visit Note   05/19/2022 Name: Justin Holloway MRN: [id number] DOB: 1929-06-17  Justin Holloway is a 89 y.o. year old male who sees Justin Peng, MD for primary care. I spoke with  Justin Holloway by phone today.  What matters to the patients health and wellness today?  Patient would like to continue to self monitor his CBG's every other day.     Goals Addressed               This Visit's Progress     Patient Stated     COMPLETED: I am waiting on authorization for my sleep study (pt-stated)        Care Coordination Interventions: Evaluation of current treatment plan related to evaluation of OSA and patient's adherence to plan as established by provider Determined patient completed his sleep study in October 2023 and is adhering to auto CPAP as directed       Other     To continue to monitor blood sugars at home        Care Coordination Interventions: Reviewed and discussed with patient he received in person demonstration of glucometer usage per embedded Pharm D Justin Holloway  Determined patient is self-monitoring his CBG's every other day without difficulty  Educated patient regarding daily glycemic control FBS 80-130, after meals <180 Advised patient, providing education and rationale, to check cbg every other day before meals and at bedtime and record, calling PCP for findings outside established parameters Review of patient status, including review of consultants reports, relevant laboratory and other test results, and medications completed Lab Results  Component Value Date   HGBA1C 6.1 (H) 03/02/2022     Interventions Today    Flowsheet Row Most Recent Value  Chronic Disease   Chronic disease during today's visit Diabetes  General Interventions   General Interventions Discussed/Reviewed General Interventions Discussed, General Interventions Reviewed, Labs, Doctor Visits, Durable Medical Equipment (DME)  Doctor Visits  Discussed/Reviewed Doctor Visits Discussed, Doctor Visits Reviewed, PCP  Durable Medical Equipment (DME) Glucomoter  Education Interventions   Education Provided Provided Education  Provided Verbal Education On Labs, Blood Sugar Monitoring  Labs Reviewed Hgb A1c          SDOH assessments and interventions completed:  No     Care Coordination Interventions:  Yes, provided   Follow up plan: Follow up call scheduled for 10/02/22 @10 :30 AM    Encounter Outcome:  Pt. Visit Completed

## 2022-05-19 NOTE — Patient Instructions (Signed)
Visit Information  Thank you for taking time to visit with me today. Please don't hesitate to contact me if I can be of assistance to you.   Following are the goals we discussed today:   Goals Addressed               This Visit's Progress     Patient Stated     COMPLETED: I am waiting on authorization for my sleep study (pt-stated)        Care Coordination Interventions: Evaluation of current treatment plan related to evaluation of OSA and patient's adherence to plan as established by provider Determined patient completed his sleep study in October 2023 and is adhering to auto CPAP as directed       Other     To continue to monitor blood sugars at home        Care Coordination Interventions: Reviewed and discussed with patient he received in person demonstration of glucometer usage per embedded Pharm D Cherylin Mylar  Determined patient is self-monitoring his CBG's every other day without difficulty  Educated patient regarding daily glycemic control FBS 80-130, after meals <180 Advised patient, providing education and rationale, to check cbg every other day before meals and at bedtime and record, calling PCP for findings outside established parameters Review of patient status, including review of consultants reports, relevant laboratory and other test results, and medications completed Lab Results  Component Value Date   HGBA1C 6.1 (H) 03/02/2022          Our next appointment is by telephone on 10/02/22 at 10:30 AM  Please call the care guide team at [phone] if you need to cancel or reschedule your appointment.   If you are experiencing a Mental Health or Behavioral Health Crisis or need someone to talk to, please call 1-800-273-TALK (toll free, 24 hour hotline) go to [facility] [address] [place] [phone])  Patient verbalizes understanding of instructions and care plan provided today and agrees to view in MyChart.  Active MyChart status and patient understanding of how to access instructions and care plan via MyChart confirmed with patient.     Delsa Sale, RN, BSN, CCM Care Management Coordinator [facility] Care Management  Direct Phone: [phone]

## 2022-05-30 ENCOUNTER — Telehealth: Payer: Self-pay

## 2022-05-30 NOTE — Telephone Encounter (Signed)
Outgoing call to patient for renal 3 month Safe Start follow up.  Renal coordinator spoke with patients wife Dois Davenport) regarding how patient was doing since last encounter. Wife shared patient was doing well but still is not checking A1C regularly. Coordinator has reached out to [facility] Care Manager Delsa Sale, RN for follow up regarding if educational classes could benefit patient. Pending feedback from RN.  Baruch Gouty Renal Coordinator [facility] Population Health [phone]

## 2022-05-31 DIAGNOSIS — E113293 Type 2 diabetes mellitus with mild nonproliferative diabetic retinopathy without macular edema, bilateral: Secondary | ICD-10-CM | POA: Diagnosis not present

## 2022-05-31 DIAGNOSIS — H25813 Combined forms of age-related cataract, bilateral: Secondary | ICD-10-CM | POA: Diagnosis not present

## 2022-05-31 DIAGNOSIS — H402230 Chronic angle-closure glaucoma, bilateral, stage unspecified: Secondary | ICD-10-CM | POA: Diagnosis not present

## 2022-05-31 DIAGNOSIS — H401134 Primary open-angle glaucoma, bilateral, indeterminate stage: Secondary | ICD-10-CM | POA: Diagnosis not present

## 2022-06-05 ENCOUNTER — Other Ambulatory Visit: Payer: Self-pay | Admitting: Internal Medicine

## 2022-06-06 ENCOUNTER — Encounter: Payer: Self-pay | Admitting: Podiatry

## 2022-06-06 ENCOUNTER — Ambulatory Visit (INDEPENDENT_AMBULATORY_CARE_PROVIDER_SITE_OTHER): Payer: Medicare Other | Admitting: Podiatry

## 2022-06-06 DIAGNOSIS — M79674 Pain in right toe(s): Secondary | ICD-10-CM

## 2022-06-06 DIAGNOSIS — I739 Peripheral vascular disease, unspecified: Secondary | ICD-10-CM | POA: Diagnosis not present

## 2022-06-06 DIAGNOSIS — M79675 Pain in left toe(s): Secondary | ICD-10-CM

## 2022-06-06 DIAGNOSIS — B351 Tinea unguium: Secondary | ICD-10-CM

## 2022-06-06 DIAGNOSIS — E1142 Type 2 diabetes mellitus with diabetic polyneuropathy: Secondary | ICD-10-CM

## 2022-06-06 NOTE — Progress Notes (Signed)
This patient returns to my office for at risk foot care.  This patient requires this care by a professional since this patient will be at risk due to having acute renal failure and diabetic neuropathy.  This patient is unable to cut nails himself since the patient cannot reach his nails.These nails are painful walking and wearing shoes. Patient has not been seen in over 16 months. This patient presents for at risk foot care today.  General Appearance  Alert, conversant and in no acute stress.  Vascular  Dorsalis pedis and posterior tibial  pulses are weakly  palpable  bilaterally.  Capillary return is within normal limits  bilaterally. Cold feet  bilaterally.  Neurologic  Senn-Weinstein monofilament wire test within normal limits  bilaterally. Muscle power within normal limits bilaterally.  Nails Thick disfigured discolored nails with subungual debris  from hallux to fifth toes bilaterally. No evidence of bacterial infection or drainage bilaterally.  Orthopedic  No limitations of motion  feet .  No crepitus or effusions noted.  No bony pathology or digital deformities noted.  Skin  normotropic skin with no porokeratosis noted bilaterally.  No signs of infections or ulcers noted.    Onychomycosis  Pain in right toes  Pain in left toes    Consent was obtained for treatment procedures.   Mechanical debridement of nails 1-5  bilaterally performed with a nail nipper.     Return office visit   3 months                   Told patient to return for periodic foot care and evaluation due to potential at risk complications.   Helane Gunther DPM

## 2022-06-08 ENCOUNTER — Other Ambulatory Visit: Payer: Self-pay | Admitting: Internal Medicine

## 2022-06-20 ENCOUNTER — Telehealth: Payer: Self-pay

## 2022-06-20 NOTE — Progress Notes (Cosign Needed)
[employer] reorder form for Tresiba 200 u/ml, Novofine needles and Ozempic 0.5 mg.    Billee Cashing, CMA Clinical Pharmacist Assistant [phone]

## 2022-06-26 DIAGNOSIS — N189 Chronic kidney disease, unspecified: Secondary | ICD-10-CM | POA: Diagnosis not present

## 2022-06-26 DIAGNOSIS — N184 Chronic kidney disease, stage 4 (severe): Secondary | ICD-10-CM | POA: Diagnosis not present

## 2022-06-28 DIAGNOSIS — D631 Anemia in chronic kidney disease: Secondary | ICD-10-CM | POA: Diagnosis not present

## 2022-06-28 DIAGNOSIS — N185 Chronic kidney disease, stage 5: Secondary | ICD-10-CM | POA: Diagnosis not present

## 2022-06-28 DIAGNOSIS — I129 Hypertensive chronic kidney disease with stage 1 through stage 4 chronic kidney disease, or unspecified chronic kidney disease: Secondary | ICD-10-CM | POA: Diagnosis not present

## 2022-06-28 DIAGNOSIS — E559 Vitamin D deficiency, unspecified: Secondary | ICD-10-CM | POA: Diagnosis not present

## 2022-06-28 DIAGNOSIS — N2581 Secondary hyperparathyroidism of renal origin: Secondary | ICD-10-CM | POA: Diagnosis not present

## 2022-06-28 DIAGNOSIS — R609 Edema, unspecified: Secondary | ICD-10-CM | POA: Diagnosis not present

## 2022-07-26 ENCOUNTER — Telehealth: Payer: Self-pay | Admitting: Internal Medicine

## 2022-07-26 NOTE — Telephone Encounter (Signed)
Pt came in to pick up pt assistance 06/28/22 Novofine 32G tip 100 quantity 2  Tresiba u200 3x18ml quantity 2  Ozempic 0.2,0.5 1x108ml quantity 5

## 2022-08-16 ENCOUNTER — Ambulatory Visit: Payer: Medicare Other | Admitting: Internal Medicine

## 2022-08-17 ENCOUNTER — Ambulatory Visit: Payer: Medicare Other | Admitting: Internal Medicine

## 2022-08-17 NOTE — Progress Notes (Deleted)
I, T Deloria Lair, CMA,acting as a Neurosurgeon for [name], MD.,have documented all relevant documentation on the behalf of [name], MD,as directed by  [name], MD while in the presence of [name], MD.  Subjective:  Patient ID: Justin Holloway , male    DOB: [birth year] , 89 y.o.   MRN: [id number]  No chief complaint on file.   HPI  Patient presents today for cough.      Past Medical History:  Diagnosis Date   Arthritis    CKD (chronic kidney disease), stage IV (HCC)    Diabetes mellitus without complication (HCC)    Glaucoma    "blurred vision", see Dr Nile Riggs yearly   Hyperlipidemia    Hypertension    TIA (transient ischemic attack)      Family History  Problem Relation Age of Onset   Cancer Mother 39       breast   COPD Father 11       Congestive heart failure     Current Outpatient Medications:    Accu-Chek Softclix Lancets lancets, Use as instructed to check blood sugars twice daily E11.69, Disp: 100 each, Rfl: 12   Ascorbic Acid (VITAMIN C) 1000 MG tablet, Take 1,000 mg by mouth daily. , Disp: , Rfl:    aspirin EC 81 MG tablet, Take 81 mg by mouth every evening., Disp: , Rfl:    blood glucose meter kit and supplies, Dispense based on patient and insurance preference. Use up to four times daily as directed. (FOR ICD-10 E10.9, E11.9)., Disp: 1 each, Rfl: 12   Blood Glucose Monitoring Suppl (ONE TOUCH ULTRA 2) w/Device KIT, Use as directed to check blood sugars 2 times per day dx: e11.22, Disp: 1 kit, Rfl: 0   cephALEXin (KEFLEX) 250 MG capsule, One tab po q12h, Disp: 14 capsule, Rfl: 0   cholecalciferol (VITAMIN D) 1000 units tablet, Take 2,000 Units by mouth daily. , Disp: , Rfl:    COMBIGAN 0.2-0.5 % ophthalmic solution, Place 1 drop into both eyes every 12 (twelve) hours., Disp: , Rfl:    diclofenac Sodium (VOLTAREN) 1 % GEL, Apply 4 g topically 4 (four) times daily., Disp: 350 g, Rfl: 5   furosemide (LASIX) 80 MG tablet, TAKE 1 TABLET  BY MOUTH IN  THE MORNING AND ONE-HALF  TABLET BY MOUTH IN THE  EARLY EVENING (Patient taking differently: Take 40-80 mg by mouth See admin instructions. 80 mg in the morning and 40 mg in the evening.), Disp: 135 tablet, Rfl: 3   glucose blood (ACCU-CHEK GUIDE) test strip, Use as instructed to check blood sugars twice daily E11.69, Disp: 100 each, Rfl: 12   glucose blood (ONETOUCH ULTRA) test strip, USE 1 STRIP TO TEST BLOOD SUGAR THREE TIMES DAILY, Disp: 300 strip, Rfl: 3   hydrALAZINE (APRESOLINE) 10 MG tablet, Take 10 mg by mouth 3 (three) times daily., Disp: , Rfl:    insulin degludec (TRESIBA) 200 UNIT/ML FlexTouch Pen, Inject 24 Units into the skin at bedtime., Disp: 3 mL, Rfl: 1   Iron-FA-B Cmp-C-Biot-Probiotic (FUSION PLUS) CAPS, Take by mouth. 1 capsule per day, Disp: , Rfl:    Lancets (ONETOUCH DELICA PLUS LANCET33G) MISC, 100 each by Does not apply route in the morning, at noon, and at bedtime., Disp: 300 each, Rfl: 3   LUMIGAN 0.01 % SOLN, Place 1 drop into both eyes 2 (two) times daily.  (Patient not taking: Reported on 09/15/2021), Disp: , Rfl:    Multiple  Vitamin (MULTIVITAMIN WITH MINERALS) TABS tablet, Take 1 tablet by mouth daily., Disp: , Rfl:    polyethylene glycol powder (GLYCOLAX/MIRALAX) 17 GM/SCOOP powder, Take 17 g by mouth daily as needed for mild constipation., Disp: , Rfl:    pravastatin (PRAVACHOL) 40 MG tablet, TAKE 1 TABLET(40 MG) BY MOUTH AT BEDTIME, Disp: 90 tablet, Rfl: 1   Semaglutide,0.25 or 0.5MG /DOS, (OZEMPIC, 0.25 OR 0.5 MG/DOSE,) 2 MG/1.5ML SOPN, Inject 0.5 mg into the skin once a week., Disp: 1.5 mL, Rfl: 1   tamsulosin (FLOMAX) 0.4 MG CAPS capsule, Take 0.4 mg by mouth daily. Patient was instructed to increase to twice daily per Dr. Liliane Shi, Urologist., Disp: , Rfl:    Allergies  Allergen Reactions   Gabapentin Itching     Review of Systems  Constitutional: Negative.   HENT: Negative.    Cardiovascular: Negative.   Gastrointestinal: Negative.    Genitourinary: Negative.   Skin: Negative.   Allergic/Immunologic: Negative.   Neurological: Negative.   Hematological: Negative.   Psychiatric/Behavioral: Negative.       There were no vitals filed for this visit. There is no height or weight on file to calculate BMI.  Wt Readings from Last 3 Encounters:  02/21/22 201 lb 12.8 oz (91.5 kg)  09/29/21 202 lb (91.6 kg)  09/15/21 202 lb 12.8 oz (92 kg)     Objective:  Physical Exam      Assessment And Plan:  Cough, unspecified type     No follow-ups on file.  Patient was given opportunity to ask questions. Patient verbalized understanding of the plan and was able to repeat key elements of the plan. All questions were answered to their satisfaction.  [name], MD  I, [name], MD, have reviewed all documentation for this visit. The documentation on 08/17/22 for the exam, diagnosis, procedures, and orders are all accurate and complete.   IF YOU HAVE BEEN REFERRED TO A SPECIALIST, IT MAY TAKE 1-2 WEEKS TO SCHEDULE/PROCESS THE REFERRAL. IF YOU HAVE NOT HEARD FROM US/SPECIALIST IN TWO WEEKS, PLEASE GIVE [place] A CALL AT [phone] X [phone].   THE PATIENT IS ENCOURAGED TO PRACTICE SOCIAL DISTANCING DUE TO THE COVID-19 PANDEMIC.

## 2022-08-23 ENCOUNTER — Encounter: Payer: Self-pay | Admitting: Family Medicine

## 2022-08-23 ENCOUNTER — Ambulatory Visit (INDEPENDENT_AMBULATORY_CARE_PROVIDER_SITE_OTHER): Payer: Medicare Other | Admitting: Family Medicine

## 2022-08-23 VITALS — BP 110/72 | HR 84 | Temp 98.7°F | Ht 67.0 in | Wt 200.0 lb

## 2022-08-23 DIAGNOSIS — E1122 Type 2 diabetes mellitus with diabetic chronic kidney disease: Secondary | ICD-10-CM | POA: Diagnosis not present

## 2022-08-23 DIAGNOSIS — Z6831 Body mass index (BMI) 31.0-31.9, adult: Secondary | ICD-10-CM

## 2022-08-23 DIAGNOSIS — E7800 Pure hypercholesterolemia, unspecified: Secondary | ICD-10-CM

## 2022-08-23 DIAGNOSIS — N184 Chronic kidney disease, stage 4 (severe): Secondary | ICD-10-CM

## 2022-08-23 DIAGNOSIS — I129 Hypertensive chronic kidney disease with stage 1 through stage 4 chronic kidney disease, or unspecified chronic kidney disease: Secondary | ICD-10-CM | POA: Diagnosis not present

## 2022-08-23 DIAGNOSIS — Z9989 Dependence on other enabling machines and devices: Secondary | ICD-10-CM

## 2022-08-23 DIAGNOSIS — R059 Cough, unspecified: Secondary | ICD-10-CM | POA: Diagnosis not present

## 2022-08-23 DIAGNOSIS — Z794 Long term (current) use of insulin: Secondary | ICD-10-CM

## 2022-08-23 DIAGNOSIS — E6609 Other obesity due to excess calories: Secondary | ICD-10-CM

## 2022-08-23 MED ORDER — BENZONATATE 100 MG PO CAPS: 200.0000 mg | ORAL_CAPSULE | Freq: Three times a day (TID) | ORAL | 0 refills | Status: DC | PRN

## 2022-08-23 MED ORDER — GUAIFENESIN ER 600 MG PO TB12: 600.0000 mg | ORAL_TABLET | Freq: Two times a day (BID) | ORAL | 0 refills | Status: AC | PRN

## 2022-08-23 NOTE — Progress Notes (Signed)
I,Jameka J Llittleton, CMA,acting as a Neurosurgeon for [employer], NP.,have documented all relevant documentation on the behalf of Kimya Mccahill, NP,as directed by  Kendon Sedeno [name], NP while in the presence of Ellyce Lafevers, NP.  Subjective:  Patient ID: Justin Holloway , male    DOB: [birth year] , 89 y.o.   MRN: [id number]  Chief Complaint  Patient presents with   Cough   Hypertension   Diabetes    HPI  Patient presents today for hypertension and diabetes management . Patient reports compliance with his meds. Patient also complains of non- productive cough he has had for 2 weeks. He has tried OTC robitussin but that is not working.     Cough This is a recurrent problem. The current episode started 1 to 4 weeks ago. The problem has been waxing and waning. The cough is Non-productive. Associated symptoms include nasal congestion and rhinorrhea. Pertinent negatives include no chills, ear congestion, ear pain, fever, postnasal drip or shortness of breath.  Diabetes He presents for his follow-up diabetic visit. He has type 2 diabetes mellitus. His disease course has been stable. There are no hypoglycemic associated symptoms. Pertinent negatives for diabetes include no blurred vision. There are no hypoglycemic complications. Risk factors for coronary artery disease include dyslipidemia, hypertension, diabetes mellitus, male sex, sedentary lifestyle and obesity. He is compliant with treatment most of the time. He is following a generally healthy diet. He participates in exercise intermittently. Eye exam is not current.  Hypertension This is a chronic problem. The current episode started more than 1 year ago. The problem is controlled. Pertinent negatives include no blurred vision, palpitations or shortness of breath. Past treatments include diuretics. The current treatment provides moderate improvement. Hypertensive end-organ damage includes kidney disease.     Past Medical History:  Diagnosis Date    Arthritis    CKD (chronic kidney disease), stage IV (HCC)    Diabetes mellitus without complication (HCC)    Glaucoma    "blurred vision", see Dr Nile Riggs yearly   Hyperlipidemia    Hypertension    TIA (transient ischemic attack)      Family History  Problem Relation Age of Onset   Cancer Mother 55       breast   COPD Father 79       Congestive heart failure     Current Outpatient Medications:    Accu-Chek Softclix Lancets lancets, Use as instructed to check blood sugars twice daily E11.69, Disp: 100 each, Rfl: 12   Ascorbic Acid (VITAMIN C) 1000 MG tablet, Take 1,000 mg by mouth daily. , Disp: , Rfl:    aspirin EC 81 MG tablet, Take 81 mg by mouth every evening., Disp: , Rfl:    benzonatate (TESSALON PERLES) 100 MG capsule, Take 2 capsules (200 mg total) by mouth 3 (three) times daily as needed for cough., Disp: 20 capsule, Rfl: 0   blood glucose meter kit and supplies, Dispense based on patient and insurance preference. Use up to four times daily as directed. (FOR ICD-10 E10.9, E11.9)., Disp: 1 each, Rfl: 12   Blood Glucose Monitoring Suppl (ONE TOUCH ULTRA 2) w/Device KIT, Use as directed to check blood sugars 2 times per day dx: e11.22, Disp: 1 kit, Rfl: 0   cholecalciferol (VITAMIN D) 1000 units tablet, Take 2,000 Units by mouth daily. , Disp: , Rfl:    COMBIGAN 0.2-0.5 % ophthalmic solution, Place 1 drop into both eyes every 12 (twelve) hours., Disp: , Rfl:    diclofenac Sodium (  VOLTAREN) 1 % GEL, Apply 4 g topically 4 (four) times daily., Disp: 350 g, Rfl: 5   furosemide (LASIX) 80 MG tablet, TAKE 1 TABLET BY MOUTH IN  THE MORNING AND ONE-HALF  TABLET BY MOUTH IN THE  EARLY EVENING (Patient taking differently: Take 40-80 mg by mouth See admin instructions. 80 mg in the morning and 40 mg in the evening.), Disp: 135 tablet, Rfl: 3   glucose blood (ACCU-CHEK GUIDE) test strip, Use as instructed to check blood sugars twice daily E11.69, Disp: 100 each, Rfl: 12   glucose blood  (ONETOUCH ULTRA) test strip, USE 1 STRIP TO TEST BLOOD SUGAR THREE TIMES DAILY, Disp: 300 strip, Rfl: 3   guaiFENesin (MUCINEX) 600 MG 12 hr tablet, Take 1 tablet (600 mg total) by mouth 2 (two) times daily as needed for up to 10 days for to loosen phlegm or cough (Take for 5 days then stop)., Disp: 20 tablet, Rfl: 0   hydrALAZINE (APRESOLINE) 10 MG tablet, Take 10 mg by mouth 3 (three) times daily., Disp: , Rfl:    insulin degludec (TRESIBA) 200 UNIT/ML FlexTouch Pen, Inject 24 Units into the skin at bedtime., Disp: 3 mL, Rfl: 1   Iron-FA-B Cmp-C-Biot-Probiotic (FUSION PLUS) CAPS, Take by mouth. 1 capsule per day, Disp: , Rfl:    Lancets (ONETOUCH DELICA PLUS LANCET33G) MISC, 100 each by Does not apply route in the morning, at noon, and at bedtime., Disp: 300 each, Rfl: 3   LUMIGAN 0.01 % SOLN, Place 1 drop into both eyes 2 (two) times daily., Disp: , Rfl:    Multiple Vitamin (MULTIVITAMIN WITH MINERALS) TABS tablet, Take 1 tablet by mouth daily., Disp: , Rfl:    polyethylene glycol powder (GLYCOLAX/MIRALAX) 17 GM/SCOOP powder, Take 17 g by mouth daily as needed for mild constipation., Disp: , Rfl:    pravastatin (PRAVACHOL) 40 MG tablet, TAKE 1 TABLET(40 MG) BY MOUTH AT BEDTIME, Disp: 90 tablet, Rfl: 1   Semaglutide,0.25 or 0.5MG /DOS, (OZEMPIC, 0.25 OR 0.5 MG/DOSE,) 2 MG/1.5ML SOPN, Inject 0.5 mg into the skin once a week., Disp: 1.5 mL, Rfl: 1   tamsulosin (FLOMAX) 0.4 MG CAPS capsule, Take 0.4 mg by mouth daily. Patient was instructed to increase to twice daily per Dr. Liliane Shi, Urologist., Disp: , Rfl:    cephALEXin (KEFLEX) 250 MG capsule, One tab po q12h (Patient not taking: Reported on 08/23/2022), Disp: 14 capsule, Rfl: 0   Allergies  Allergen Reactions   Gabapentin Itching     Review of Systems  Constitutional: Negative.  Negative for chills and fever.  HENT:  Positive for rhinorrhea. Negative for ear pain, postnasal drip, sinus pressure and sinus pain.   Eyes: Negative.  Negative for  blurred vision.  Respiratory:  Positive for cough. Negative for shortness of breath.   Cardiovascular:  Negative for palpitations.  Endocrine: Negative.   Musculoskeletal:  Positive for gait problem.       Uses a walker  Skin: Negative.   Allergic/Immunologic: Negative.   Hematological: Negative.   Psychiatric/Behavioral: Negative.       Today's Vitals   08/23/22 1023  BP: 110/72  Pulse: 84  Temp: 98.7 F (37.1 C)  Weight: 200 lb (90.7 kg)  Height: 5\' 7"  (1.702 m)  PainSc: 0-No pain   Body mass index is 31.32 kg/m.  Wt Readings from Last 3 Encounters:  08/23/22 200 lb (90.7 kg)  02/21/22 201 lb 12.8 oz (91.5 kg)  09/29/21 202 lb (91.6 kg)     Objective:  Physical Exam HENT:     Nose: Congestion present.  Cardiovascular:     Pulses: Normal pulses.  Pulmonary:     Effort: Pulmonary effort is normal.     Breath sounds: Normal breath sounds. No wheezing, rhonchi or rales.  Abdominal:     General: Bowel sounds are normal.  Skin:    General: Skin is warm and dry.  Neurological:     Mental Status: He is alert and oriented to person, place, and time.         Assessment And Plan:  Cough, unspecified type -     guaiFENesin ER; Take 1 tablet (600 mg total) by mouth 2 (two) times daily as needed for up to 10 days for to loosen phlegm or cough (Take for 5 days then stop).  Dispense: 20 tablet; Refill: 0 -     Benzonatate; Take 2 capsules (200 mg total) by mouth 3 (three) times daily as needed for cough.  Dispense: 20 capsule; Refill: 0  Type 2 diabetes mellitus with stage 4 chronic kidney disease, with long-term current use of insulin (HCC) -     Hemoglobin A1c  Hypertensive nephropathy -     CBC -     CMP14+EGFR  Pure hypercholesterolemia -     Lipid panel  Class 1 obesity due to excess calories with body mass index (BMI) of 31.0 to 31.9 in adult, unspecified whether serious comorbidity present     Return if symptoms worsen or fail to improve, for keep next  appt as scheduled .  Patient was given opportunity to ask questions. Patient verbalized understanding of the plan and was able to repeat key elements of the plan. All questions were answered to their satisfaction.  Manika Hast [name], NP  I, Gila Lauf [name], NP, have reviewed all documentation for this visit. The documentation on 08/28/22 for the exam, diagnosis, procedures, and orders are all accurate and complete.   IF YOU HAVE BEEN REFERRED TO A SPECIALIST, IT MAY TAKE 1-2 WEEKS TO SCHEDULE/PROCESS THE REFERRAL. IF YOU HAVE NOT HEARD FROM US/SPECIALIST IN TWO WEEKS, PLEASE GIVE [place] A CALL AT [phone].   THE PATIENT IS ENCOURAGED TO PRACTICE SOCIAL DISTANCING DUE TO THE COVID-19 PANDEMIC.

## 2022-08-24 LAB — CBC
Hematocrit: 29.7 % — ABNORMAL LOW (ref 37.5–51.0)
Hemoglobin: 9.9 g/dL — ABNORMAL LOW (ref 13.0–17.7)
MCH: 29.8 pg (ref 26.6–33.0)
MCHC: 33.3 g/dL (ref 31.5–35.7)
MCV: 90 fL (ref 79–97)
Platelets: 212 10*3/uL (ref 150–450)
RBC: 3.32 x10E6/uL — ABNORMAL LOW (ref 4.14–5.80)
RDW: 12 % (ref 11.6–15.4)
WBC: 6.6 10*3/uL (ref 3.4–10.8)

## 2022-08-24 LAB — CMP14+EGFR
ALT: 6 IU/L (ref 0–44)
AST: 14 IU/L (ref 0–40)
Albumin: 4.1 g/dL (ref 3.6–4.6)
Alkaline Phosphatase: 102 IU/L (ref 44–121)
BUN/Creatinine Ratio: 19 (ref 10–24)
BUN: 82 mg/dL (ref 10–36)
Bilirubin Total: 0.2 mg/dL (ref 0.0–1.2)
CO2: 20 mmol/L (ref 20–29)
Calcium: 8.8 mg/dL (ref 8.6–10.2)
Chloride: 103 mmol/L (ref 96–106)
Creatinine, Ser: 4.41 mg/dL — ABNORMAL HIGH (ref 0.76–1.27)
Globulin, Total: 2.3 g/dL (ref 1.5–4.5)
Glucose: 102 mg/dL — ABNORMAL HIGH (ref 70–99)
Potassium: 4.7 mmol/L (ref 3.5–5.2)
Sodium: 138 mmol/L (ref 134–144)
Total Protein: 6.4 g/dL (ref 6.0–8.5)
eGFR: 12 mL/min/{1.73_m2} — ABNORMAL LOW (ref >59)

## 2022-08-24 LAB — LIPID PANEL
Chol/HDL Ratio: 2.9 ratio (ref 0.0–5.0)
Cholesterol, Total: 136 mg/dL (ref 100–199)
HDL: 47 mg/dL (ref >39)
LDL Chol Calc (NIH): 76 mg/dL (ref 0–99)
Triglycerides: 62 mg/dL (ref 0–149)
VLDL Cholesterol Cal: 13 mg/dL (ref 5–40)

## 2022-08-24 LAB — HEMOGLOBIN A1C
Est. average glucose Bld gHb Est-mCnc: 131 mg/dL
Hgb A1c MFr Bld: 6.2 % — ABNORMAL HIGH (ref 4.8–5.6)

## 2022-08-28 DIAGNOSIS — R059 Cough, unspecified: Secondary | ICD-10-CM | POA: Insufficient documentation

## 2022-08-28 NOTE — Assessment & Plan Note (Signed)
A1c 02/2022 6.1, on ozempic SQ weekly

## 2022-08-28 NOTE — Assessment & Plan Note (Addendum)
Stable eGFR 14 in 02/2022, check labs

## 2022-09-06 ENCOUNTER — Ambulatory Visit: Payer: Medicare Other | Admitting: Podiatry

## 2022-09-22 DIAGNOSIS — N184 Chronic kidney disease, stage 4 (severe): Secondary | ICD-10-CM | POA: Diagnosis not present

## 2022-09-27 DIAGNOSIS — I120 Hypertensive chronic kidney disease with stage 5 chronic kidney disease or end stage renal disease: Secondary | ICD-10-CM | POA: Diagnosis not present

## 2022-09-27 DIAGNOSIS — N2581 Secondary hyperparathyroidism of renal origin: Secondary | ICD-10-CM | POA: Diagnosis not present

## 2022-09-27 DIAGNOSIS — D631 Anemia in chronic kidney disease: Secondary | ICD-10-CM | POA: Diagnosis not present

## 2022-09-27 DIAGNOSIS — R609 Edema, unspecified: Secondary | ICD-10-CM | POA: Diagnosis not present

## 2022-09-27 DIAGNOSIS — E872 Acidosis, unspecified: Secondary | ICD-10-CM | POA: Diagnosis not present

## 2022-09-27 DIAGNOSIS — N185 Chronic kidney disease, stage 5: Secondary | ICD-10-CM | POA: Diagnosis not present

## 2022-09-28 ENCOUNTER — Encounter: Payer: Self-pay | Admitting: Internal Medicine

## 2022-09-28 ENCOUNTER — Ambulatory Visit: Payer: Medicare Other

## 2022-09-28 ENCOUNTER — Ambulatory Visit: Payer: Medicare Other | Admitting: Internal Medicine

## 2022-09-28 VITALS — BP 130/60 | HR 84 | Temp 97.9°F | Ht 67.0 in | Wt 202.0 lb

## 2022-09-28 VITALS — BP 130/60 | HR 64 | Temp 97.9°F | Ht 67.0 in | Wt 202.0 lb

## 2022-09-28 DIAGNOSIS — E7800 Pure hypercholesterolemia, unspecified: Secondary | ICD-10-CM

## 2022-09-28 DIAGNOSIS — Z0000 Encounter for general adult medical examination without abnormal findings: Secondary | ICD-10-CM

## 2022-09-28 DIAGNOSIS — Z794 Long term (current) use of insulin: Secondary | ICD-10-CM | POA: Diagnosis not present

## 2022-09-28 DIAGNOSIS — Z79899 Other long term (current) drug therapy: Secondary | ICD-10-CM

## 2022-09-28 DIAGNOSIS — Z23 Encounter for immunization: Secondary | ICD-10-CM

## 2022-09-28 DIAGNOSIS — R269 Unspecified abnormalities of gait and mobility: Secondary | ICD-10-CM | POA: Diagnosis not present

## 2022-09-28 DIAGNOSIS — E6609 Other obesity due to excess calories: Secondary | ICD-10-CM

## 2022-09-28 DIAGNOSIS — R5383 Other fatigue: Secondary | ICD-10-CM

## 2022-09-28 DIAGNOSIS — E1122 Type 2 diabetes mellitus with diabetic chronic kidney disease: Secondary | ICD-10-CM | POA: Diagnosis not present

## 2022-09-28 DIAGNOSIS — R82998 Other abnormal findings in urine: Secondary | ICD-10-CM

## 2022-09-28 DIAGNOSIS — E1151 Type 2 diabetes mellitus with diabetic peripheral angiopathy without gangrene: Secondary | ICD-10-CM

## 2022-09-28 DIAGNOSIS — I739 Peripheral vascular disease, unspecified: Secondary | ICD-10-CM

## 2022-09-28 DIAGNOSIS — N184 Chronic kidney disease, stage 4 (severe): Secondary | ICD-10-CM

## 2022-09-28 DIAGNOSIS — Z6831 Body mass index (BMI) 31.0-31.9, adult: Secondary | ICD-10-CM

## 2022-09-28 DIAGNOSIS — I129 Hypertensive chronic kidney disease with stage 1 through stage 4 chronic kidney disease, or unspecified chronic kidney disease: Secondary | ICD-10-CM

## 2022-09-28 LAB — POCT URINALYSIS DIPSTICK
Bilirubin, UA: NEGATIVE
Blood, UA: NEGATIVE
Glucose, UA: NEGATIVE
Ketones, UA: NEGATIVE
Nitrite, UA: NEGATIVE
Protein, UA: POSITIVE — AB
Spec Grav, UA: 1.02 (ref 1.010–1.025)
Urobilinogen, UA: 0.2 E.U./dL
pH, UA: 5.5 (ref 5.0–8.0)

## 2022-09-28 MED ORDER — TAMSULOSIN HCL 0.4 MG PO CAPS: 0.4000 mg | ORAL_CAPSULE | Freq: Every day | ORAL | 1 refills | Status: DC

## 2022-09-28 MED ORDER — TETANUS-DIPHTH-ACELL PERTUSSIS 5-2.5-18.5 LF-MCG/0.5 IM SUSP: 0.5000 mL | Freq: Once | INTRAMUSCULAR | 0 refills | Status: AC

## 2022-09-28 MED ORDER — CYANOCOBALAMIN 1000 MCG/ML IJ SOLN
1000.0000 ug | Freq: Once | INTRAMUSCULAR | Status: AC
Start: 2022-09-28 — End: 2022-09-28
  Administered 2022-09-28: 1000 ug via INTRAMUSCULAR

## 2022-09-28 NOTE — Progress Notes (Signed)
I,[name], CMA,acting as a Neurosurgeon for Justin Aliment, MD.,have documented all relevant documentation on the behalf of Justin Aliment, MD,as directed by  Justin Aliment, MD while in the presence of Justin Aliment, MD.  Subjective:  Patient ID: Justin Holloway , male    DOB: [birth year] , 89 y.o.   MRN: [id number]  Chief Complaint  Patient presents with   Diabetes   Hypertension   Hyperlipidemia    HPI  Patient presents today for a DM, BP & cholesterol check, Patient is compliance with all medication. Denies headache, chest pain & SOB.   AWV completed with [facility] Advisor: [place].  HE received the flu vaccine during this visit.     Diabetes He presents for his follow-up diabetic visit. He has type 2 diabetes mellitus. His disease course has been stable. There are no hypoglycemic associated symptoms. Pertinent negatives for diabetes include no blurred vision. There are no hypoglycemic complications. Risk factors for coronary artery disease include dyslipidemia, hypertension, diabetes mellitus, male sex, sedentary lifestyle and obesity. He is compliant with treatment most of the time. He is following a generally healthy diet. He participates in exercise intermittently. Eye exam is not current.  Hypertension This is a chronic problem. The current episode started more than 1 year ago. The problem is controlled. Pertinent negatives include no blurred vision, palpitations or shortness of breath. Past treatments include diuretics. The current treatment provides moderate improvement. Hypertensive end-organ damage includes kidney disease.     Past Medical History:  Diagnosis Date   Arthritis    CKD (chronic kidney disease), stage IV (HCC)    Diabetes mellitus without complication (HCC)    Glaucoma    "blurred vision", see Dr Nile Riggs yearly   Hyperlipidemia    Hypertension    TIA (transient ischemic attack)      Family History  Problem Relation Age of Onset   Cancer Mother 33        breast   COPD Father 22       Congestive heart failure     Current Outpatient Medications:    Accu-Chek Softclix Lancets lancets, Use as instructed to check blood sugars twice daily E11.69, Disp: 100 each, Rfl: 12   Ascorbic Acid (VITAMIN C) 1000 MG tablet, Take 1,000 mg by mouth daily. , Disp: , Rfl:    aspirin EC 81 MG tablet, Take 81 mg by mouth every evening., Disp: , Rfl:    blood glucose meter kit and supplies, Dispense based on patient and insurance preference. Use up to four times daily as directed. (FOR ICD-10 E10.9, E11.9)., Disp: 1 each, Rfl: 12   Blood Glucose Monitoring Suppl (ONE TOUCH ULTRA 2) w/Device KIT, Use as directed to check blood sugars 2 times per day dx: e11.22, Disp: 1 kit, Rfl: 0   cephALEXin (KEFLEX) 250 MG capsule, One tab po q12h (Patient not taking: Reported on 08/23/2022), Disp: 14 capsule, Rfl: 0   cholecalciferol (VITAMIN D) 1000 units tablet, Take 2,000 Units by mouth daily. , Disp: , Rfl:    COMBIGAN 0.2-0.5 % ophthalmic solution, Place 1 drop into both eyes every 12 (twelve) hours. (Patient not taking: Reported on 09/28/2022), Disp: , Rfl:    diclofenac Sodium (VOLTAREN) 1 % GEL, Apply 4 g topically 4 (four) times daily. (Patient not taking: Reported on 09/28/2022), Disp: 350 g, Rfl: 5   furosemide (LASIX) 80 MG tablet, TAKE 1 TABLET BY MOUTH IN  THE MORNING AND ONE-HALF  TABLET BY MOUTH IN  THE  EARLY EVENING (Patient taking differently: Take 40-80 mg by mouth See admin instructions. 80 mg in the morning and 40 mg in the evening.), Disp: 135 tablet, Rfl: 3   glucose blood (ACCU-CHEK GUIDE) test strip, Use as instructed to check blood sugars twice daily E11.69, Disp: 100 each, Rfl: 12   glucose blood (ONETOUCH ULTRA) test strip, USE 1 STRIP TO TEST BLOOD SUGAR THREE TIMES DAILY, Disp: 300 strip, Rfl: 3   guaiFENesin (MUCINEX) 600 MG 12 hr tablet, Take 1 tablet (600 mg total) by mouth 2 (two) times daily as needed for up to 10 days for to loosen phlegm or  cough (Take for 5 days then stop)., Disp: 20 tablet, Rfl: 0   hydrALAZINE (APRESOLINE) 10 MG tablet, Take 10 mg by mouth 3 (three) times daily., Disp: , Rfl:    insulin degludec (TRESIBA) 200 UNIT/ML FlexTouch Pen, Inject 24 Units into the skin at bedtime., Disp: 3 mL, Rfl: 1   Iron-FA-B Cmp-C-Biot-Probiotic (FUSION PLUS) CAPS, Take by mouth. 1 capsule per day (Patient not taking: Reported on 09/28/2022), Disp: , Rfl:    Lancets (ONETOUCH DELICA PLUS LANCET33G) MISC, 100 each by Does not apply route in the morning, at noon, and at bedtime., Disp: 300 each, Rfl: 3   Multiple Vitamin (MULTIVITAMIN WITH MINERALS) TABS tablet, Take 1 tablet by mouth daily. (Patient not taking: Reported on 09/28/2022), Disp: , Rfl:    pravastatin (PRAVACHOL) 40 MG tablet, TAKE 1 TABLET(40 MG) BY MOUTH AT BEDTIME, Disp: 90 tablet, Rfl: 1   Semaglutide,0.25 or 0.5MG /DOS, (OZEMPIC, 0.25 OR 0.5 MG/DOSE,) 2 MG/1.5ML SOPN, Inject 0.5 mg into the skin once a week., Disp: 1.5 mL, Rfl: 1   tamsulosin (FLOMAX) 0.4 MG CAPS capsule, Take 1 capsule (0.4 mg total) by mouth daily., Disp: 90 capsule, Rfl: 1   Allergies  Allergen Reactions   Gabapentin Itching     Review of Systems  Constitutional: Negative.   HENT: Negative.    Eyes:  Negative for blurred vision.  Respiratory: Negative.  Negative for shortness of breath.   Cardiovascular: Negative.  Negative for palpitations.  Genitourinary: Negative.   Skin: Negative.   Allergic/Immunologic: Negative.   Neurological: Negative.   Hematological: Negative.      Today's Vitals   09/28/22 1428  BP: 130/60  Pulse: 64  Temp: 97.9 F (36.6 C)  SpO2: 98%  Weight: 202 lb (91.6 kg)  Height: 5\' 7"  (1.702 m)   Body mass index is 31.64 kg/m.  Wt Readings from Last 3 Encounters:  09/28/22 202 lb (91.6 kg)  09/28/22 202 lb (91.6 kg)  08/23/22 200 lb (90.7 kg)     Objective:  Physical Exam Vitals and nursing note reviewed.  Constitutional:      Appearance: Normal  appearance. He is obese.  HENT:     Head: Normocephalic and atraumatic.  Eyes:     Extraocular Movements: Extraocular movements intact.  Cardiovascular:     Rate and Rhythm: Normal rate and regular rhythm.     Heart sounds: Normal heart sounds.  Pulmonary:     Effort: Pulmonary effort is normal.     Breath sounds: Normal breath sounds.  Musculoskeletal:     Cervical back: Normal range of motion.     Comments: Ambulatory with walker  Skin:    General: Skin is warm.  Neurological:     General: No focal deficit present.     Mental Status: He is alert.  Psychiatric:  Mood and Affect: Mood normal.         Assessment And Plan:  Type 2 diabetes mellitus with stage 4 chronic kidney disease, with long-term current use of insulin (HCC) Assessment & Plan: Chronic, he will continue with semaglutide 0.5mg  weekly and Tresiba 24 units sq nightly. He reports his sugars are well controlled. I will check labs and adjust meds as needed.   Orders: -     POCT urinalysis dipstick  Hypertensive nephropathy Assessment & Plan: Chronic, also followed by Nephrology.  Importance of dietary/medication compliance was discussed with the patient. He will continue with furosemide 80mg  in am and 1/2 tab I'm pm and hydralazine 10mg  tid.  Orders: -     POCT urinalysis dipstick  Pure hypercholesterolemia Assessment & Plan: Chronic, LDL goal is less than 70.  He will continue with pravastatin 40mg  daily. He is encouraged to avoid fried foods and increase daily activity.    Peripheral vascular disease, unspecified (HCC) Assessment & Plan: Chronic, encouraged to increase daily activity. Reminded to perform chair exercises while watching TV.   Abnormality of gait Assessment & Plan: Ambulatory with walker. His wife would like for him to have home physical therapy. I will place referral as requested.   Orders: -     Ambulatory referral to Home Health  Other fatigue -      Cyanocobalamin  Leukocytes in urine Assessment & Plan: I will send specimen off for urine culture. I will treat as per C&S results.   Orders: -     Urine Culture  Class 1 obesity due to excess calories with body mass index (BMI) of 31.0 to 31.9 in adult, unspecified whether serious comorbidity present Assessment & Plan: He is encouraged to increase daily activity and to decrease his intake of processed foods.    Immunization due  Drug therapy -     Vitamin B12  Other orders -     Tetanus-Diphth-Acell Pertussis; Inject 0.5 mLs into the muscle once for 1 dose.  Dispense: 0.5 mL; Refill: 0 -     Tamsulosin HCl; Take 1 capsule (0.4 mg total) by mouth daily.  Dispense: 90 capsule; Refill: 1  She is encouraged to strive for BMI less than 30 to decrease cardiac risk. Advised to aim for at least 150 minutes of exercise per week.    Return in 3 months (on 12/28/2022), or dm check, for 1 year ov w thn & rs. .  Patient was given opportunity to ask questions. Patient verbalized understanding of the plan and was able to repeat key elements of the plan. All questions were answered to their satisfaction.    I, Justin Aliment, MD, have reviewed all documentation for this visit. The documentation on 09/28/22 for the exam, diagnosis, procedures, and orders are all accurate and complete.   IF YOU HAVE BEEN REFERRED TO A SPECIALIST, IT MAY TAKE 1-2 WEEKS TO SCHEDULE/PROCESS THE REFERRAL. IF YOU HAVE NOT HEARD FROM US/SPECIALIST IN TWO WEEKS, PLEASE GIVE [place] A CALL AT [phone] X [phone].   THE PATIENT IS ENCOURAGED TO PRACTICE SOCIAL DISTANCING DUE TO THE COVID-19 PANDEMIC.

## 2022-09-28 NOTE — Patient Instructions (Addendum)
Mr. Therrell , Thank you for taking time to come for your Medicare Wellness Visit. I appreciate your ongoing commitment to your health goals. Please review the following plan we discussed and let me know if I can assist you in the future.   Referrals/Orders/Follow-Ups/Clinician Recommendations: none  This is a list of the screening recommended for you and due dates:  Health Maintenance  Topic Date Due   DTaP/Tdap/Td vaccine (2 - Td or Tdap) 07/27/2019   Eye exam for diabetics  03/17/2022   COVID-19 Vaccine (4 - 2023-24 season) 09/10/2022   Complete foot exam   02/22/2023   Hemoglobin A1C  02/23/2023   Medicare Annual Wellness Visit  09/28/2023   Pneumonia Vaccine  Completed   Flu Shot  Completed   Zoster (Shingles) Vaccine  Completed   HPV Vaccine  Aged Out    Advanced directives: (In Chart) A copy of your advanced directives are scanned into your chart should your provider ever need it.  Next Medicare Annual Wellness Visit scheduled for next year: No, office will schedule appointment  insert Preventive Care attachment Insert FALL PREVENTION attachment if needed

## 2022-09-28 NOTE — Patient Instructions (Signed)

## 2022-09-28 NOTE — Progress Notes (Signed)
Subjective:   Justin Holloway is a 89 y.o. male who presents for Medicare Annual/Subsequent preventive examination.  Visit Complete: In person    Cardiac Risk Factors include: advanced age (>38men, >25 women);diabetes mellitus;dyslipidemia;hypertension;male gender;obesity (BMI >30kg/m2)     Objective:    Today's Vitals   09/28/22 1416  BP: 130/60  Pulse: 84  Temp: 97.9 F (36.6 C)  TempSrc: Oral  SpO2: 99%  Weight: 202 lb (91.6 kg)  Height: 5\' 7"  (1.702 m)   Body mass index is 31.64 kg/m.     09/28/2022    2:23 PM 09/15/2021    2:23 PM 11/16/2020    2:58 PM 08/18/2020   11:24 AM 05/10/2020    1:00 PM 05/10/2020   12:12 PM 05/04/2020    2:43 PM  Advanced Directives  Does Patient Have a Medical Advance Directive? Yes Yes No Yes  No No  Type of Estate agent of [place];Living will Healthcare Power of [place];Living will  Healthcare Power of [place];Living will     Copy of Healthcare Power of Attorney in Chart? Yes - validated most recent copy scanned in chart (See row information) Yes - validated most recent copy scanned in chart (See row information)  Yes - validated most recent copy scanned in chart (See row information)     Would patient like information on creating a medical advance directive?     Yes (Inpatient - patient defers creating a medical advance directive at this time - Information given)      Current Medications (verified) Outpatient Encounter Medications as of 09/28/2022  Medication Sig   Accu-Chek Softclix Lancets lancets Use as instructed to check blood sugars twice daily E11.69   Ascorbic Acid (VITAMIN C) 1000 MG tablet Take 1,000 mg by mouth daily.    aspirin EC 81 MG tablet Take 81 mg by mouth every evening.   blood glucose meter kit and supplies Dispense based on patient and insurance preference. Use up to four times daily as directed. (FOR ICD-10 E10.9, E11.9).   Blood Glucose Monitoring Suppl (ONE TOUCH ULTRA 2) w/Device KIT Use  as directed to check blood sugars 2 times per day dx: e11.22   cholecalciferol (VITAMIN D) 1000 units tablet Take 2,000 Units by mouth daily.    furosemide (LASIX) 80 MG tablet TAKE 1 TABLET BY MOUTH IN  THE MORNING AND ONE-HALF  TABLET BY MOUTH IN THE  EARLY EVENING (Patient taking differently: Take 40-80 mg by mouth See admin instructions. 80 mg in the morning and 40 mg in the evening.)   glucose blood (ACCU-CHEK GUIDE) test strip Use as instructed to check blood sugars twice daily E11.69   glucose blood (ONETOUCH ULTRA) test strip USE 1 STRIP TO TEST BLOOD SUGAR THREE TIMES DAILY   hydrALAZINE (APRESOLINE) 10 MG tablet Take 10 mg by mouth 3 (three) times daily.   insulin degludec (TRESIBA) 200 UNIT/ML FlexTouch Pen Inject 24 Units into the skin at bedtime.   Lancets (ONETOUCH DELICA PLUS LANCET33G) MISC 100 each by Does not apply route in the morning, at noon, and at bedtime.   pravastatin (PRAVACHOL) 40 MG tablet TAKE 1 TABLET(40 MG) BY MOUTH AT BEDTIME   Semaglutide,0.25 or 0.5MG /DOS, (OZEMPIC, 0.25 OR 0.5 MG/DOSE,) 2 MG/1.5ML SOPN Inject 0.5 mg into the skin once a week.   tamsulosin (FLOMAX) 0.4 MG CAPS capsule Take 0.4 mg by mouth daily. Patient was instructed to increase to twice daily per Dr. Liliane Shi, Urologist.   benzonatate (TESSALON PERLES) 100 MG  capsule Take 2 capsules (200 mg total) by mouth 3 (three) times daily as needed for cough. (Patient not taking: Reported on 09/28/2022)   cephALEXin (KEFLEX) 250 MG capsule One tab po q12h (Patient not taking: Reported on 08/23/2022)   COMBIGAN 0.2-0.5 % ophthalmic solution Place 1 drop into both eyes every 12 (twelve) hours. (Patient not taking: Reported on 09/28/2022)   diclofenac Sodium (VOLTAREN) 1 % GEL Apply 4 g topically 4 (four) times daily. (Patient not taking: Reported on 09/28/2022)   guaiFENesin (MUCINEX) 600 MG 12 hr tablet Take 1 tablet (600 mg total) by mouth 2 (two) times daily as needed for up to 10 days for to loosen phlegm or cough  (Take for 5 days then stop).   Iron-FA-B Cmp-C-Biot-Probiotic (FUSION PLUS) CAPS Take by mouth. 1 capsule per day (Patient not taking: Reported on 09/28/2022)   LUMIGAN 0.01 % SOLN Place 1 drop into both eyes 2 (two) times daily. (Patient not taking: Reported on 09/28/2022)   Multiple Vitamin (MULTIVITAMIN WITH MINERALS) TABS tablet Take 1 tablet by mouth daily. (Patient not taking: Reported on 09/28/2022)   polyethylene glycol powder (GLYCOLAX/MIRALAX) 17 GM/SCOOP powder Take 17 g by mouth daily as needed for mild constipation. (Patient not taking: Reported on 09/28/2022)   No facility-administered encounter medications on file as of 09/28/2022.    Allergies (verified) Gabapentin   History: Past Medical History:  Diagnosis Date   Arthritis    CKD (chronic kidney disease), stage IV (HCC)    Diabetes mellitus without complication (HCC)    Glaucoma    "blurred vision", see Dr Nile Riggs yearly   Hyperlipidemia    Hypertension    TIA (transient ischemic attack)    Past Surgical History:  Procedure Laterality Date   INGUINAL HERNIA REPAIR Left 06/09/2013   Procedure: OPEN REPAIR LEFT INGUINAL HERNIA;  Surgeon: Ernestene Mention, MD;  Location: [facility] OR;  Service: General;  Laterality: Left;   INSERTION OF MESH Left 06/09/2013   Procedure: INSERTION OF MESH;  Surgeon: Ernestene Mention, MD;  Location: [facility] OR;  Service: General;  Laterality: Left;   Family History  Problem Relation Age of Onset   Cancer Mother 57       breast   COPD Father 30       Congestive heart failure   Social History   Socioeconomic History   Marital status: Married    Spouse name: Dois Davenport   Number of children: 8   Years of education: 12   Highest education level: Not on file  Occupational History   Occupation: retired  Tobacco Use   Smoking status: Former    Types: Cigars   Smokeless tobacco: Never   Tobacco comments:    socially, not long  Vaping Use   Vaping status: Never Used  Substance and Sexual Activity    Alcohol use: Never   Drug use: Never   Sexual activity: Not Currently  Other Topics Concern   Not on file  Social History Narrative   Lives with wife at home    caffeine- coffee 1 cup daily   Social Determinants of Health   Financial Resource Strain: Low Risk  (09/28/2022)   Overall Financial Resource Strain (CARDIA)    Difficulty of Paying Living Expenses: Not hard at all  Food Insecurity: No Food Insecurity (09/28/2022)   Hunger Vital Sign    Worried About Running Out of Food in the Last Year: Never true    Ran Out of Food in the Last Year:  Never true  Transportation Needs: No Transportation Needs (09/28/2022)   PRAPARE - Administrator, Civil Service (Medical): No    Lack of Transportation (Non-Medical): No  Physical Activity: Inactive (09/28/2022)   Exercise Vital Sign    Days of Exercise per Week: 0 days    Minutes of Exercise per Session: 0 min  Stress: No Stress Concern Present (09/28/2022)   [employer] of Occupational Health - Occupational Stress Questionnaire    Feeling of Stress : Not at all  Social Connections: Moderately Integrated (09/28/2022)   Social Connection and Isolation Panel [NHANES]    Frequency of Communication with Friends and Family: More than three times a week    Frequency of Social Gatherings with Friends and Family: Once a week    Attends Religious Services: More than 4 times per year    Active Member of [employer] or Organizations: No    Attends Engineer, structural: Never    Marital Status: Married    Tobacco Counseling Counseling given: Not Answered Tobacco comments: socially, not long   Clinical Intake:  Pre-visit preparation completed: Yes  Pain : No/denies pain     Nutritional Status: BMI > 30  Obese Nutritional Risks: None Diabetes: Yes CBG done?: No Did pt. bring in CBG monitor from home?: No  How often do you need to have someone help you when you read instructions, pamphlets, or other written materials  from your doctor or pharmacy?: 1 - Never  Interpreter Needed?: No  Information entered by :: NAllen LPN   Activities of Daily Living    09/28/2022    2:18 PM  In your present state of health, do you have any difficulty performing the following activities:  Hearing? 0  Vision? 0  Difficulty concentrating or making decisions? 0  Walking or climbing stairs? 1  Dressing or bathing? 1  Doing errands, shopping? 0  Preparing Food and eating ? N  Using the Toilet? N  In the past six months, have you accidently leaked urine? Y  Comment incontinent  Do you have problems with loss of bowel control? N  Managing your Medications? N  Managing your Finances? N  Housekeeping or managing your Housekeeping? Y    Patient Care Team: [name], MD as PCP - General (Internal Medicine) Chalmers Guest, MD as Consulting Physician (Ophthalmology) Harlan Stains, [facility] (Inactive) (Pharmacist) Clarene Duke Karma Lew, RN as [facility], [facility]  Indicate any recent Medical Services you may have received from other than [facility] providers in the past year (date may be approximate).     Assessment:   This is a routine wellness examination for Abrahim.  Hearing/Vision screen Hearing Screening - Comments:: Denies hearing issues Vision Screening - Comments:: Regular eye exams, Dr. Harlon Flor   Goals Addressed             This Visit's Progress    Patient Stated       09/28/2022, denies goals at this time       Depression Screen    09/28/2022    2:25 PM 08/23/2022   10:20 AM 02/21/2022    2:07 PM 09/15/2021    2:23 PM 09/01/2021    3:39 PM 08/18/2020   11:24 AM 06/09/2020    3:48 PM  PHQ 2/9 Scores  PHQ - 2 Score 0 0 0 0 0 0 0  PHQ- 9 Score 6 0     0    Fall Risk  09/28/2022    2:24 PM 02/21/2022    2:07 PM 09/15/2021    2:23 PM 09/01/2021    3:39 PM 11/16/2020    2:58 PM  Fall Risk   Falls in the past year? 0 0 0 0 1  Number falls in past yr:  0 0 0 0 0  Injury with Fall? 0 0 0 0 1  Risk for fall due to : Medication side effect;Impaired mobility;Impaired balance/gait Impaired balance/gait;Impaired mobility Medication side effect Impaired mobility   Follow up Falls prevention discussed;Falls evaluation completed Falls evaluation completed Falls prevention discussed;Education provided;Falls evaluation completed Falls evaluation completed     MEDICARE RISK AT HOME: Medicare Risk at Home Any stairs in or around the home?: No If so, are there any without handrails?: No Home free of loose throw rugs in walkways, pet beds, electrical cords, etc?: Yes Adequate lighting in your home to reduce risk of falls?: Yes Life alert?: No Use of a cane, walker or w/c?: Yes Grab bars in the bathroom?: Yes Shower chair or bench in shower?: Yes Elevated toilet seat or a handicapped toilet?: No  TIMED UP AND GO:  Was the test performed?  Yes  Length of time to ambulate 10 feet: 8 sec Gait slow and steady with assistive device    Cognitive Function:        09/28/2022    2:26 PM 09/15/2021    2:24 PM 08/18/2020   11:26 AM 05/28/2019   12:01 PM 08/29/2018    9:43 AM  6CIT Screen  What Year? 0 points 0 points 0 points 0 points 0 points  What month? 0 points 0 points 0 points 0 points 0 points  What time? 0 points 0 points 0 points 0 points 0 points  Count back from 20 0 points 2 points 4 points 4 points 4 points  Months in reverse 4 points 4 points 4 points 4 points 4 points  Repeat phrase 4 points 8 points 6 points 0 points 2 points  Total Score 8 points 14 points 14 points 8 points 10 points    Immunizations Immunization History  Administered Date(s) Administered   Fluad Quad(high Dose 65+) 09/23/2019, 10/06/2020, 09/29/2021   Fluad Trivalent(High Dose 65+) 09/28/2022   Influenza, High Dose Seasonal PF 11/21/2017, 08/29/2018   PFIZER(Purple Top)SARS-COV-2 Vaccination 02/01/2019, 03/06/2019, 11/08/2019   PNEUMOCOCCAL CONJUGATE-20  02/21/2022   Pneumococcal Polysaccharide-23 12/10/1998   Tdap 07/26/2009   Zoster Recombinant(Shingrix) 03/17/2021, 06/21/2021    TDAP status: Due, Education has been provided regarding the importance of this vaccine. Advised may receive this vaccine at local pharmacy or Health Dept. Aware to provide a copy of the vaccination record if obtained from local pharmacy or Health Dept. Verbalized acceptance and understanding.  Flu Vaccine status: Completed at today's visit  Pneumococcal vaccine status: Up to date  Covid-19 vaccine status: Information provided on how to obtain vaccines.   Qualifies for Shingles Vaccine? Yes   Zostavax completed Yes   Shingrix Completed?: Yes  Screening Tests Health Maintenance  Topic Date Due   DTaP/Tdap/Td (2 - Td or Tdap) 07/27/2019   OPHTHALMOLOGY EXAM  03/17/2022   COVID-19 Vaccine (4 - 2023-24 season) 09/10/2022   FOOT EXAM  02/22/2023   HEMOGLOBIN A1C  02/23/2023   Medicare Annual Wellness (AWV)  09/28/2023   Pneumonia Vaccine 74+ Years old  Completed   INFLUENZA VACCINE  Completed   Zoster Vaccines- Shingrix  Completed   HPV VACCINES  Aged Out    Health Maintenance  Health Maintenance Due  Topic Date Due   DTaP/Tdap/Td (2 - Td or Tdap) 07/27/2019   OPHTHALMOLOGY EXAM  03/17/2022   COVID-19 Vaccine (4 - 2023-24 season) 09/10/2022    Colorectal cancer screening: No longer required.   Lung Cancer Screening: (Low Dose CT Chest recommended if Age 39-80 years, 20 pack-year currently smoking OR have quit w/in 15years.) does not qualify.   Lung Cancer Screening Referral: no  Additional Screening:  Hepatitis C Screening: does not qualify;   Vision Screening: Recommended annual ophthalmology exams for early detection of glaucoma and other disorders of the eye. Is the patient up to date with their annual eye exam?  No  Who is the provider or what is the name of the office in which the patient attends annual eye exams? Dr. Harlon Flor If pt is  not established with a provider, would they like to be referred to a provider to establish care? No .   Dental Screening: Recommended annual dental exams for proper oral hygiene  Diabetic Foot Exam: Diabetic Foot Exam: Completed 02/21/2022  Community Resource Referral / Chronic Care Management: CRR required this visit?  No   CCM required this visit?  No     Plan:     I have personally reviewed and noted the following in the patient's chart:   Medical and social history Use of alcohol, tobacco or illicit drugs  Current medications and supplements including opioid prescriptions. Patient is not currently taking opioid prescriptions. Functional ability and status Nutritional status Physical activity Advanced directives List of other physicians Hospitalizations, surgeries, and ER visits in previous 12 months Vitals Screenings to include cognitive, depression, and falls Referrals and appointments  In addition, I have reviewed and discussed with patient certain preventive protocols, quality metrics, and best practice recommendations. A written personalized care plan for preventive services as well as general preventive health recommendations were provided to patient.     Barb Merino, LPN   2/84/1324   After Visit Summary: (In Person-Printed) AVS printed and given to the patient  Nurse Notes: none

## 2022-09-29 ENCOUNTER — Telehealth: Payer: Self-pay

## 2022-09-29 LAB — VITAMIN B12: Vitamin B-12: 711 pg/mL (ref 232–1245)

## 2022-09-29 NOTE — Telephone Encounter (Signed)
Patient aware. PA ready for pick up.

## 2022-10-02 ENCOUNTER — Telehealth: Payer: Self-pay

## 2022-10-02 ENCOUNTER — Encounter: Payer: Self-pay | Admitting: Internal Medicine

## 2022-10-02 ENCOUNTER — Ambulatory Visit: Payer: Self-pay

## 2022-10-02 NOTE — Telephone Encounter (Signed)
Order for a walker submitted through parachute for patient. YL,RMA

## 2022-10-02 NOTE — Patient Outreach (Signed)
Care Coordination   10/02/2022 Name: Justin Holloway MRN: [id number] DOB: 11-Jul-1929   Care Coordination Outreach Attempts:  An unsuccessful telephone outreach was attempted for a scheduled appointment today.  Follow Up Plan:  Additional outreach attempts will be made to offer the patient care coordination information and services.   Encounter Outcome:  No Answer   Care Coordination Interventions:  No, not indicated    Delsa Sale RN BSN CCM [place]  [facility] Nurse Care Coordinator  Direct Dial: [phone] Website: [email or web address]

## 2022-10-03 ENCOUNTER — Telehealth: Payer: Self-pay

## 2022-10-03 NOTE — Telephone Encounter (Signed)
Patient received PA for Tresiba U200 3x3ML & Ozempic 0.25, 0.5 1x3ML.

## 2022-10-04 LAB — URINE CULTURE

## 2022-10-06 ENCOUNTER — Telehealth: Payer: Self-pay | Admitting: *Deleted

## 2022-10-06 NOTE — Progress Notes (Signed)
Care Coordination Note  10/06/2022 Name: Justin Holloway MRN: [id number] DOB: 1929/01/24  [name] is a 89 y.o. year old male who is a primary care patient of [name], MD and is actively engaged with the care management team. I reached out to [name] by phone today to assist with re-scheduling a follow up visit with the RN Case Manager  Follow up plan: Unsuccessful telephone outreach attempt made. A HIPAA compliant phone message was left for the patient providing contact information and requesting a return call.   [facility]  Care Coordination Care Guide  Direct Dial: [phone]

## 2022-10-08 DIAGNOSIS — R269 Unspecified abnormalities of gait and mobility: Secondary | ICD-10-CM | POA: Insufficient documentation

## 2022-10-08 DIAGNOSIS — R82998 Other abnormal findings in urine: Secondary | ICD-10-CM | POA: Insufficient documentation

## 2022-10-08 NOTE — Assessment & Plan Note (Signed)
Chronic, LDL goal is less than 70.  He will continue with pravastatin 40mg  daily. He is encouraged to avoid fried foods and increase daily activity.

## 2022-10-08 NOTE — Assessment & Plan Note (Signed)
I will send specimen off for urine culture. I will treat as per C&S results.

## 2022-10-08 NOTE — Assessment & Plan Note (Signed)
Ambulatory with walker. His wife would like for him to have home physical therapy. I will place referral as requested.

## 2022-10-08 NOTE — Assessment & Plan Note (Signed)
Chronic, encouraged to increase daily activity. Reminded to perform chair exercises while watching TV.

## 2022-10-08 NOTE — Assessment & Plan Note (Addendum)
Chronic, also followed by Nephrology.  Importance of dietary/medication compliance was discussed with the patient. He will continue with furosemide 80mg  in am and 1/2 tab I'm pm and hydralazine 10mg  tid.

## 2022-10-08 NOTE — Assessment & Plan Note (Addendum)
Chronic, he will continue with semaglutide 0.5mg  weekly and Tresiba 24 units sq nightly. He reports his sugars are well controlled. I will check labs and adjust meds as needed.

## 2022-10-08 NOTE — Assessment & Plan Note (Signed)
He is encouraged to increase daily activity and to decrease his intake of processed foods.

## 2022-10-09 ENCOUNTER — Other Ambulatory Visit: Payer: Self-pay

## 2022-10-09 MED ORDER — DOXYCYCLINE MONOHYDRATE 100 MG PO CAPS: 100.0000 mg | ORAL_CAPSULE | Freq: Two times a day (BID) | ORAL | 0 refills | Status: DC

## 2022-10-12 DIAGNOSIS — H25813 Combined forms of age-related cataract, bilateral: Secondary | ICD-10-CM | POA: Diagnosis not present

## 2022-10-12 DIAGNOSIS — E113293 Type 2 diabetes mellitus with mild nonproliferative diabetic retinopathy without macular edema, bilateral: Secondary | ICD-10-CM | POA: Diagnosis not present

## 2022-10-12 DIAGNOSIS — H401134 Primary open-angle glaucoma, bilateral, indeterminate stage: Secondary | ICD-10-CM | POA: Diagnosis not present

## 2022-10-16 NOTE — Progress Notes (Signed)
Care Coordination Note  10/16/2022 Name: Justin Holloway MRN: [id number] DOB: May 11, 1929  Justin Holloway is a 89 y.o. year old male who is a primary care patient of [name], MD and is actively engaged with the care management team. I reached out to [name] by phone today to assist with re-scheduling a follow up visit with the RN Case Manager  Follow up plan: Telephone appointment with care management team member scheduled for:11/09/22  [facility] Coordination Care Guide  Direct Dial: [phone]

## 2022-11-09 ENCOUNTER — Ambulatory Visit: Payer: Self-pay

## 2022-11-09 NOTE — Patient Outreach (Signed)
Care Coordination   Follow Up Visit Note   11/09/2022 Name: Justin Holloway MRN: [id number] DOB: Dec 29, 1929  Justin Holloway is a 89 y.o. year old male who sees [name], MD for primary care. I spoke with  Justin Holloway by phone today.  What matters to the patients health and wellness today?  Patient would like to avoid having hypoglycemic events.     Goals Addressed             This Visit's Progress    To continue to monitor blood sugars at home   On track    Care Coordination Interventions: Provided education to patient about basic DM disease process Educated patient about hypo and hyperglycemia and importance of correct treatment; educated on 15 minute rule and mailed printed educational material for reference  Discussed with patient he is experiencing multiple episodes of hypoglycemia in the low 50's Reviewed with patient his dietary schedule with dinner and bedtime snacks Reviewed medications with patient and determined patient is taking 24 units of Tresiba at bedtime, Ozempic 0.5 mg weekly Collaborated with Dr. Allyne Gee advising of patient's reported hypoglycemia Instructed patient to decrease his Tresiba to 18 units nightly per Dr. Allyne Gee, then continue to record sugars and provide Dr. Allyne Gee with readings on Monday, 11/13/22, patient verbalizes understanding and is able to repeat back the instructions provided Advised patient, providing education and rationale, to check cbg daily before meals and at bedtime and record, calling PCP for findings outside established parameters Review of patient status, including review of consultants reports, relevant laboratory and other test results, and medications completed Lab Results  Component Value Date   HGBA1C 6.2 (H) 08/23/2022       To manage CKD stage V, and Nutrition   On track    Care Coordination Interventions: Assessed the Patient understanding of chronic kidney disease    Evaluation of current treatment plan  related to chronic kidney disease self management and patient's adherence to plan as established by provider   Review of patient status, including review of consultant's reports, relevant laboratory and other test results, and medications completed    Reviewed prescribed diet renal including avoiding high potassium foods to limit  Discussed plans with patient for ongoing care coordination follow up and provided patient with direct contact information for nurse care coordinator Mailed printed educational materials related to Eating Right for Chronic Kidney disease; Diabetes and Chronic Kidney disease Last practice recorded BP readings:  BP Readings from Last 3 Encounters:  09/28/22 130/60  09/28/22 130/60  08/23/22 110/72   Most recent eGFR/CrCl:  Lab Results  Component Value Date   EGFR 12 (L) 08/23/2022    No components found for: "CRCL"     Interventions Today    Flowsheet Row Most Recent Value  Chronic Disease   Chronic disease during today's visit Diabetes, Chronic Kidney Disease/End Stage Renal Disease (ESRD)  General Interventions   General Interventions Discussed/Reviewed General Interventions Discussed, General Interventions Reviewed, Doctor Visits, Labs  Doctor Visits Discussed/Reviewed Doctor Visits Discussed, Doctor Visits Reviewed, PCP  Education Interventions   Education Provided Provided Education  Provided Verbal Education On Nutrition, When to see the doctor, Medication, Labs, Blood Sugar Monitoring  Labs Reviewed Hgb A1c, Kidney Function  Nutrition Interventions   Nutrition Discussed/Reviewed Nutrition Discussed, Nutrition Reviewed  [limit high potassium foods]  Pharmacy Interventions   Pharmacy Dicussed/Reviewed Pharmacy Topics Discussed, Pharmacy Topics Reviewed, Medications and their functions          SDOH assessments  and interventions completed:  No     Care Coordination Interventions:  Yes, provided   Follow up plan: Follow up call scheduled for  11/23/22 @1 :00 PM    Encounter Outcome:  Patient Visit Completed

## 2022-11-09 NOTE — Patient Instructions (Signed)
Visit Information  Thank you for taking time to visit with me today. Please don't hesitate to contact me if I can be of assistance to you.   Following are the goals we discussed today:   Goals Addressed             This Visit's Progress    To continue to monitor blood sugars at home   On track    Care Coordination Interventions: Provided education to patient about basic DM disease process Educated patient about hypo and hyperglycemia and importance of correct treatment; educated on 15 minute rule and mailed printed educational material for reference  Discussed with patient he is experiencing multiple episodes of hypoglycemia in the low 50's Reviewed with patient his dietary schedule with dinner and bedtime snacks Reviewed medications with patient and determined patient is taking 24 units of Tresiba at bedtime, Ozempic 0.5 mg weekly Collaborated with Dr. Allyne Gee advising of patient's reported hypoglycemia Instructed patient to decrease his Tresiba to 18 units nightly per Dr. Allyne Gee, then continue to record sugars and provide Dr. Allyne Gee with readings on Monday, 11/13/22, patient verbalizes understanding and is able to repeat back the instructions provided Advised patient, providing education and rationale, to check cbg daily before meals and at bedtime and record, calling PCP for findings outside established parameters Review of patient status, including review of consultants reports, relevant laboratory and other test results, and medications completed Lab Results  Component Value Date   HGBA1C 6.2 (H) 08/23/2022           To manage CKD stage V, and Nutrition   On track    Care Coordination Interventions: Assessed the Patient understanding of chronic kidney disease    Evaluation of current treatment plan related to chronic kidney disease self management and patient's adherence to plan as established by provider   Review of patient status, including review of consultant's reports,  relevant laboratory and other test results, and medications completed    Reviewed prescribed diet renal including avoiding high potassium foods to limit  Discussed plans with patient for ongoing care coordination follow up and provided patient with direct contact information for nurse care coordinator Mailed printed educational materials related to Eating Right for Chronic Kidney disease; Diabetes and Chronic Kidney disease Last practice recorded BP readings:  BP Readings from Last 3 Encounters:  09/28/22 130/60  09/28/22 130/60  08/23/22 110/72   Most recent eGFR/CrCl:  Lab Results  Component Value Date   EGFR 12 (L) 08/23/2022    No components found for: "CRCL"           Our next appointment is by telephone on 11/23/22 at 1:00 PM  Please call the care guide team at [phone] if you need to cancel or reschedule your appointment.   If you are experiencing a Mental Health or Behavioral Health Crisis or need someone to talk to, please call 1-800-273-TALK (toll free, 24 hour hotline)  Patient verbalizes understanding of instructions and care plan provided today and agrees to view in MyChart. Active MyChart status and patient understanding of how to access instructions and care plan via MyChart confirmed with patient.     Delsa Sale RN BSN CCM [place]  [facility], [facility] Health Nurse Care Coordinator  Direct Dial: [phone] Website: [email or web address]

## 2022-11-23 ENCOUNTER — Ambulatory Visit: Payer: Self-pay

## 2022-11-23 NOTE — Patient Instructions (Signed)
Visit Information  Thank you for taking time to visit with me today. Please don't hesitate to contact me if I can be of assistance to you.   Following are the goals we discussed today:   Goals Addressed             This Visit's Progress    To continue to monitor blood sugars at home   On track    Care Coordination Interventions: Evaluation of current treatment plan related to type 2 diabetes and patient's adherence to plan as established by provider Reviewed and discussed patient's recorded blood sugars following decrease to 18 u of Tresiba 11/1-117, 11/2-67, 11/3-82, 11/5-84, 11/6-65, 11/7-92, 11/8-98, 11/9-68, 11/10-85, 11/11-110, 11/12-81, 11/13-105, 11/14-85 Reiterated to patient how to manage hypoglycemia and patient verbalizes understanding with repeat back  Collaborated with Dr. Allyne Gee regarding patient's ongoing hypoglycemia, determined Dr. Allyne Gee would like for patient to decrease his Tresiba to 12 units daily at bedtime, advised patient of PCP recommendation, patient verbalizes understanding with repeat back Discussed plans with patient for ongoing care coordination follow up and provided patient with direct contact information for nurse care coordinator Lab Results  Component Value Date   HGBA1C 6.2 (H) 08/23/2022         Our next appointment is by telephone on 11/30/22 at 12:30 PM  Please call the care guide team at [phone] if you need to cancel or reschedule your appointment.   If you are experiencing a Mental Health or Behavioral Health Crisis or need someone to talk to, please call 1-800-273-TALK (toll free, 24 hour hotline)  Patient verbalizes understanding of instructions and care plan provided today and agrees to view in MyChart. Active MyChart status and patient understanding of how to access instructions and care plan via MyChart confirmed with patient.     Delsa Sale RN BSN CCM [place]  [facility], [facility] Health Nurse Care  Coordinator  Direct Dial: [phone] Website: [email or web address]

## 2022-11-23 NOTE — Patient Outreach (Signed)
  Care Coordination   Follow Up Visit Note   11/23/2022 Name: Justin Holloway MRN: [id number] DOB: September 12, 1929  Justin Holloway is a 89 y.o. year old male who sees Justin Peng, MD for primary care. I spoke with  Justin Holloway by phone today.  What matters to the patients health and wellness today?  Patient would like to manage his diabetes without having hypoglycemia.     Goals Addressed             This Visit's Progress    To continue to monitor blood sugars at home   On track    Care Coordination Interventions: Evaluation of current treatment plan related to type 2 diabetes and patient's adherence to plan as established by provider Reviewed and discussed patient's recorded blood sugars following decrease to 18 u of Tresiba 11/1-117, 11/2-67, 11/3-82, 11/5-84, 11/6-65, 11/7-92, 11/8-98, 11/9-68, 11/10-85, 11/11-110, 11/12-81, 11/13-105, 11/14-85 Reiterated to patient how to manage hypoglycemia and patient verbalizes understanding with repeat back  Collaborated with Justin Holloway regarding patient's ongoing hypoglycemia, determined Justin Holloway would like for patient to decrease his Tresiba to 12 units daily at bedtime, advised patient of PCP recommendation, patient verbalizes understanding with repeat back Discussed plans with patient for ongoing care coordination follow up and provided patient with direct contact information for nurse care coordinator Lab Results  Component Value Date   HGBA1C 6.2 (H) 08/23/2022     Interventions Today    Flowsheet Row Most Recent Value  Chronic Disease   Chronic disease during today's visit Diabetes  General Interventions   General Interventions Discussed/Reviewed Durable Medical Equipment (DME), General Interventions Reviewed, General Interventions Discussed, Communication with  Durable Medical Equipment (DME) Glucomoter  Communication with PCP/Specialists  [Dr. Sanders]  Education Interventions   Education Provided Provided  Education  Provided Verbal Education On Blood Sugar Monitoring, When to see the doctor, Medication  Pharmacy Interventions   Pharmacy Dicussed/Reviewed Pharmacy Topics Discussed, Pharmacy Topics Reviewed, Medications and their functions          SDOH assessments and interventions completed:  No     Care Coordination Interventions:  Yes, provided   Follow up plan: Follow up call scheduled for 12/30/22 @12 :30 PM    Encounter Outcome:  Patient Visit Completed

## 2022-11-30 ENCOUNTER — Ambulatory Visit: Payer: Self-pay

## 2022-11-30 ENCOUNTER — Emergency Department (HOSPITAL_COMMUNITY): Payer: Medicare Other

## 2022-11-30 ENCOUNTER — Emergency Department (HOSPITAL_COMMUNITY)
Admission: EM | Admit: 2022-11-30 | Discharge: 2022-11-30 | Disposition: A | Discharge status: Home / Self Care | Payer: Medicare Other | Attending: Emergency Medicine | Admitting: Emergency Medicine

## 2022-11-30 DIAGNOSIS — Z992 Dependence on renal dialysis: Secondary | ICD-10-CM | POA: Diagnosis not present

## 2022-11-30 DIAGNOSIS — R609 Edema, unspecified: Secondary | ICD-10-CM | POA: Diagnosis not present

## 2022-11-30 DIAGNOSIS — R600 Localized edema: Secondary | ICD-10-CM | POA: Diagnosis not present

## 2022-11-30 DIAGNOSIS — E1122 Type 2 diabetes mellitus with diabetic chronic kidney disease: Secondary | ICD-10-CM | POA: Diagnosis not present

## 2022-11-30 DIAGNOSIS — R0602 Shortness of breath: Secondary | ICD-10-CM | POA: Diagnosis not present

## 2022-11-30 DIAGNOSIS — Z794 Long term (current) use of insulin: Secondary | ICD-10-CM | POA: Insufficient documentation

## 2022-11-30 DIAGNOSIS — R9389 Abnormal findings on diagnostic imaging of other specified body structures: Secondary | ICD-10-CM | POA: Diagnosis not present

## 2022-11-30 DIAGNOSIS — I700 Atherosclerosis of aorta: Secondary | ICD-10-CM | POA: Diagnosis not present

## 2022-11-30 DIAGNOSIS — R2243 Localized swelling, mass and lump, lower limb, bilateral: Secondary | ICD-10-CM | POA: Insufficient documentation

## 2022-11-30 DIAGNOSIS — I120 Hypertensive chronic kidney disease with stage 5 chronic kidney disease or end stage renal disease: Secondary | ICD-10-CM | POA: Insufficient documentation

## 2022-11-30 DIAGNOSIS — Z79899 Other long term (current) drug therapy: Secondary | ICD-10-CM | POA: Insufficient documentation

## 2022-11-30 DIAGNOSIS — N186 End stage renal disease: Secondary | ICD-10-CM | POA: Insufficient documentation

## 2022-11-30 DIAGNOSIS — Z743 Need for continuous supervision: Secondary | ICD-10-CM | POA: Diagnosis not present

## 2022-11-30 LAB — CBC WITH DIFFERENTIAL/PLATELET
Abs Immature Granulocytes: 0.03 10*3/uL (ref 0.00–0.07)
Basophils Absolute: 0 10*3/uL (ref 0.0–0.1)
Basophils Relative: 1 %
Eosinophils Absolute: 0.2 10*3/uL (ref 0.0–0.5)
Eosinophils Relative: 2 %
HCT: 25.7 % — ABNORMAL LOW (ref 39.0–52.0)
Hemoglobin: 8 g/dL — ABNORMAL LOW (ref 13.0–17.0)
Immature Granulocytes: 0 %
Lymphocytes Relative: 28 %
Lymphs Abs: 2.2 10*3/uL (ref 0.7–4.0)
MCH: 29.7 pg (ref 26.0–34.0)
MCHC: 31.1 g/dL (ref 30.0–36.0)
MCV: 95.5 fL (ref 80.0–100.0)
Monocytes Absolute: 0.7 10*3/uL (ref 0.1–1.0)
Monocytes Relative: 10 %
Neutro Abs: 4.4 10*3/uL (ref 1.7–7.7)
Neutrophils Relative %: 59 %
Platelets: 214 10*3/uL (ref 150–400)
RBC: 2.69 MIL/uL — ABNORMAL LOW (ref 4.22–5.81)
RDW: 12.3 % (ref 11.5–15.5)
WBC: 7.6 10*3/uL (ref 4.0–10.5)
nRBC: 0 % (ref 0.0–0.2)

## 2022-11-30 LAB — I-STAT CHEM 8, ED
BUN: 80 mg/dL — ABNORMAL HIGH (ref 8–23)
Calcium, Ion: 1.1 mmol/L — ABNORMAL LOW (ref 1.15–1.40)
Chloride: 106 mmol/L (ref 98–111)
Creatinine, Ser: 5 mg/dL — ABNORMAL HIGH (ref 0.61–1.24)
Glucose, Bld: 66 mg/dL — ABNORMAL LOW (ref 70–99)
HCT: 44 % (ref 39.0–52.0)
Hemoglobin: 15 g/dL (ref 13.0–17.0)
Potassium: 4.5 mmol/L (ref 3.5–5.1)
Sodium: 139 mmol/L (ref 135–145)
TCO2: 25 mmol/L (ref 22–32)

## 2022-11-30 LAB — COMPREHENSIVE METABOLIC PANEL
ALT: 8 U/L (ref 0–44)
AST: 22 U/L (ref 15–41)
Albumin: 3.5 g/dL (ref 3.5–5.0)
Alkaline Phosphatase: 70 U/L (ref 38–126)
Anion gap: 12 (ref 5–15)
BUN: 67 mg/dL — ABNORMAL HIGH (ref 8–23)
CO2: 21 mmol/L — ABNORMAL LOW (ref 22–32)
Calcium: 9.1 mg/dL (ref 8.9–10.3)
Chloride: 105 mmol/L (ref 98–111)
Creatinine, Ser: 4.94 mg/dL — ABNORMAL HIGH (ref 0.61–1.24)
GFR, Estimated: 10 mL/min — ABNORMAL LOW (ref >60)
Glucose, Bld: 69 mg/dL — ABNORMAL LOW (ref 70–99)
Potassium: 4.3 mmol/L (ref 3.5–5.1)
Sodium: 138 mmol/L (ref 135–145)
Total Bilirubin: 0.9 mg/dL (ref <1.2)
Total Protein: 6.6 g/dL (ref 6.5–8.1)

## 2022-11-30 LAB — BRAIN NATRIURETIC PEPTIDE: B Natriuretic Peptide: 66.4 pg/mL (ref 0.0–100.0)

## 2022-11-30 LAB — TROPONIN I (HIGH SENSITIVITY): Troponin I (High Sensitivity): 13 ng/L (ref <18)

## 2022-11-30 MED ORDER — FUROSEMIDE 80 MG PO TABS: 80.0000 mg | ORAL_TABLET | Freq: Two times a day (BID) | ORAL | 0 refills | Status: DC

## 2022-11-30 NOTE — Patient Instructions (Signed)
Visit Information  Thank you for taking time to visit with me today. Please don't hesitate to contact me if I can be of assistance to you.   Following are the goals we discussed today:   Goals Addressed             This Visit's Progress    To continue to monitor blood sugars at home   On track    Care Coordination Interventions: Evaluation of current treatment plan related to type 2 diabetes and patient's adherence to plan as established by provider Reviewed and discussed patient's recorded blood sugars following decrease to 12 u of Tresiba 68,87,190,179,144,105,115 Sent in basket message to Dr. Allyne Gee with list of most recent blood sugars Discussed plans with patient for ongoing care coordination follow up and provided patient with direct contact information for nurse care coordinator Lab Results  Component Value Date   HGBA1C 6.2 (H) 08/23/2022       To manage CKD stage V, and Nutrition   Not on track    Care Coordination Interventions: Assessed the Patient understanding of chronic kidney disease    Evaluation of current treatment plan related to chronic kidney disease self management and patient's adherence to plan as established by provider  Determined patient is experiencing increased swelling to his lower extremities bilaterally up to his ankles with abdominal tightness, shortness of breath is unchanged, he denies having other symptoms  Sent secure message to Dr. Allyne Gee, PCP regarding patient's reported symptoms, reply received per Dr. Allyne Gee, patient should go to the ED for further evaluation of his symptoms, patient and wife are agreeable and wife will drive patient and accompanying him  Patient will go to [facility] ED for further evaluation of his symptoms Patient will continue to work with nurse care coordinator for chronic disease management and care coordination needs          Our next appointment is by telephone on 12/12/22 at 1:00 PM  Please call the care  guide team at [phone] if you need to cancel or reschedule your appointment.   If you are experiencing a Mental Health or Behavioral Health Crisis or need someone to talk to, please call 1-800-273-TALK (toll free, 24 hour hotline)  Patient verbalizes understanding of instructions and care plan provided today and agrees to view in MyChart. Active MyChart status and patient understanding of how to access instructions and care plan via MyChart confirmed with patient.     Delsa Sale RN BSN CCM   [facility], [facility] Health Nurse Care Coordinator  Direct Dial: [phone] Website: [email or web address]

## 2022-11-30 NOTE — Patient Outreach (Signed)
  Care Coordination   Follow Up Visit Note   11/30/2022 Name: Justin Holloway MRN: [id number] DOB: October 03, 1929  Justin Holloway is a 89 y.o. year old male who sees Justin Peng, MD for primary care. I spoke with  Justin Holloway by phone today.  What matters to the patients health and wellness today?  Patient will go to the ED for evaluation of worsening leg swelling.     Goals Addressed             This Visit's Progress    To continue to monitor blood sugars at home   On track    Care Coordination Interventions: Evaluation of current treatment plan related to type 2 diabetes and patient's adherence to plan as established by provider Reviewed and discussed patient's recorded blood sugars following decrease to 12 u of Tresiba 68,87,190,179,144,105,115 Sent in basket message to Dr. Allyne Holloway with list of most recent blood sugars Discussed plans with patient for ongoing care coordination follow up and provided patient with direct contact information for nurse care coordinator Lab Results  Component Value Date   HGBA1C 6.2 (H) 08/23/2022       To manage CKD stage V, and Nutrition   Not on track    Care Coordination Interventions: Assessed the Patient understanding of chronic kidney disease    Evaluation of current treatment plan related to chronic kidney disease self management and patient's adherence to plan as established by provider  Determined patient is experiencing increased swelling to his lower extremities bilaterally up to his ankles with abdominal tightness, shortness of breath is unchanged, he denies having other symptoms  Sent secure message to Dr. Allyne Holloway, PCP regarding patient's reported symptoms, reply received per Dr. Allyne Holloway, patient should go to the ED for further evaluation of his symptoms, patient and wife are agreeable and wife will drive patient and accompanying him  Patient will go [facility] ED for further evaluation of his symptoms Patient will  continue to work with nurse care coordinator for chronic disease management and care coordination needs      Interventions Today    Flowsheet Row Most Recent Value  Chronic Disease   Chronic disease during today's visit Chronic Kidney Disease/End Stage Renal Disease (ESRD), Diabetes  General Interventions   General Interventions Discussed/Reviewed General Interventions Discussed, General Interventions Reviewed, Doctor Visits, Labs, Durable Medical Equipment (DME), Communication with  Doctor Visits Discussed/Reviewed Doctor Visits Discussed, Doctor Visits Reviewed, PCP, Specialist  Durable Medical Equipment (DME) Glucomoter  Communication with PCP/Specialists  [Dr. Sanders]  Education Interventions   Education Provided Provided Education  Provided Verbal Education On When to see the doctor, Medication, Blood Sugar Monitoring  Labs Reviewed Kidney Function  Pharmacy Interventions   Pharmacy Dicussed/Reviewed Pharmacy Topics Reviewed, Pharmacy Topics Discussed          SDOH assessments and interventions completed:  No     Care Coordination Interventions:  Yes, provided   Follow up plan: Follow up call scheduled for 12/12/22 @1 :00 PM    Encounter Outcome:  Patient Visit Completed

## 2022-11-30 NOTE — ED Triage Notes (Signed)
Pt has hx of stage 4 kidney failure but refuses dialysis. Wife noticed increased swelling in his legs, abdomen and increased SOB on exertion over the last 4 days. No pain. Aox4.

## 2022-11-30 NOTE — Discharge Instructions (Addendum)
Thank you for allowing [place] to be a part of your care today.  You were evaluated for edema (fluid) in your legs and abdomen.  Your workup shows worsening kidney function and you likely have end-stage renal disease.  I recommend following up with a nephrologist.  I have provided their information for you.  You may also reach out to your primary care doctor to help with the referral process (this may work better for you).   I have changed your Lasix dosing to 80 mg twice daily.  You may take it as often as three times daily if you feel you are holding too much fluid.  Return to the ED if you develop sudden worsening of your symptoms, have shortness of breath, chest pain, or if you have new concerns.

## 2022-11-30 NOTE — ED Provider Notes (Signed)
[place] EMERGENCY DEPARTMENT AT [facility] Provider Note   CSN: [id number] Arrival date & time: 11/30/22  1337     History  Chief Complaint  Patient presents with   Leg Swelling    Justin Holloway is a 89 y.o. male with past medical history significant for diabetes, hypertension, arthritis, hyperlipidemia, TIA, CKD stage IV presents to the ED via EMS due to swelling in the patient's abdomen and legs.  Patient has also been having dyspnea with exertion for the past couple weeks.  He states that he is not experiencing any pain.  Patient has had swelling in his legs before, but never this severe.  He is taking his prescribed Lasix, 80 mg once daily.  Patient reports his weight is up approximately 6 lbs.  He does not have shortness of breath when sitting or lying down.  Denies fever, chest pain, syncope, dizziness, light-headedness, weakness, abdominal pain, nausea, vomiting, diarrhea.  He does still make urine.         Home Medications Prior to Admission medications   Medication Sig Start Date End Date Taking? Authorizing Provider  furosemide (LASIX) 80 MG tablet Take 1 tablet (80 mg total) by mouth 2 (two) times daily. 11/30/22  Yes Lorenz Donley R, PA-C  Accu-Chek Softclix Lancets lancets Use as instructed to check blood sugars twice daily E11.69 09/05/21   [name], MD  Ascorbic Acid (VITAMIN C) 1000 MG tablet Take 1,000 mg by mouth daily.     [provider]  aspirin EC 81 MG tablet Take 81 mg by mouth every evening.    [provider]  blood glucose meter kit and supplies Dispense based on patient and insurance preference. Use up to four times daily as directed. (FOR ICD-10 E10.9, E11.9). 03/28/22   [name], MD  Blood Glucose Monitoring Suppl (ONE TOUCH ULTRA 2) w/Device KIT Use as directed to check blood sugars 2 times per day dx: e11.22 03/29/21   [name], MD  cephALEXin [facility]) 250 MG capsule One tab po q12h Patient not taking:  Reported on 08/23/2022 02/27/22   [name], MD  cholecalciferol (VITAMIN D) 1000 units tablet Take 2,000 Units by mouth daily.     [provider]  COMBIGAN 0.2-0.5 % ophthalmic solution Place 1 drop into both eyes every 12 (twelve) hours. Patient not taking: Reported on 09/28/2022 09/01/19   [provider]  diclofenac Sodium (VOLTAREN) 1 % GEL Apply 4 g topically 4 (four) times daily. Patient not taking: Reported on 09/28/2022 03/12/21   Theadora Rama Scales, PA-C  doxycycline (MONODOX) 100 MG capsule Take 1 capsule (100 mg total) by mouth 2 (two) times daily. 10/09/22   [name], MD  glucose blood (ACCU-CHEK GUIDE) test strip Use as instructed to check blood sugars twice daily E11.69 09/05/21   [name], MD  glucose blood (ONETOUCH ULTRA) test strip USE 1 STRIP TO TEST BLOOD SUGAR THREE TIMES DAILY 06/06/22   [name], MD  guaiFENesin (MUCINEX) 600 MG 12 hr tablet Take 1 tablet (600 mg total) by mouth 2 (two) times daily as needed for up to 10 days for to loosen phlegm or cough (Take for 5 days then stop). 08/23/22 09/02/22  Ellender Hose, NP  hydrALAZINE (APRESOLINE) 10 MG tablet Take 10 mg by mouth 3 (three) times daily. 01/21/20   [provider]  insulin degludec (TRESIBA) 200 UNIT/ML FlexTouch Pen Inject 24 Units into the skin at bedtime. Patient taking differently: Inject 12 Units into the  skin at bedtime. 11/23/22 Per Dr. Allyne Gee, decrease Evaristo Bury to 12 units daily at bedtime. 06/10/20   [name], MD  insulin degludec (TRESIBA) 200 UNIT/ML FlexTouch Pen Inject 12 Units into the skin at bedtime.    [provider]  Iron-FA-B Cmp-C-Biot-Probiotic (FUSION PLUS) CAPS Take by mouth. 1 capsule per day Patient not taking: Reported on 09/28/2022    [provider]  Lancets [facility] DELICA PLUS LANCET33G) MISC 100 each by Does not apply route in the morning, at noon, and at bedtime. 03/29/21   [name], MD  Multiple Vitamin  (MULTIVITAMIN WITH MINERALS) TABS tablet Take 1 tablet by mouth daily. Patient not taking: Reported on 09/28/2022    [provider]  pravastatin (PRAVACHOL) 40 MG tablet TAKE 1 TABLET(40 MG) BY MOUTH AT BEDTIME 06/09/22   [name], MD  Semaglutide,0.25 or 0.5MG /DOS, (OZEMPIC, 0.25 OR 0.5 MG/DOSE,) 2 MG/1.5ML SOPN Inject 0.5 mg into the skin once a week. 06/10/20   [name], MD  tamsulosin (FLOMAX) 0.4 MG CAPS capsule Take 1 capsule (0.4 mg total) by mouth daily. 09/28/22   [name], MD      Allergies    Gabapentin    Review of Systems   Review of Systems  Constitutional:  Negative for fever.  Respiratory:  Positive for shortness of breath (exertional).   Cardiovascular:  Positive for leg swelling. Negative for chest pain.  Gastrointestinal:  Negative for abdominal pain, diarrhea, nausea and vomiting.  Neurological:  Negative for dizziness, syncope, weakness, light-headedness and numbness.    Physical Exam Updated Vital Signs BP (!) 106/41   Pulse 71   Temp 97.6 F (36.4 C) (Oral)   Resp 20   Ht 5\' 5"  (1.651 m)   Wt 93 kg   SpO2 100%   BMI 34.11 kg/m  Physical Exam Vitals and nursing note reviewed.  Constitutional:      General: He is not in acute distress.    Appearance: Normal appearance. He is not ill-appearing or diaphoretic.  Cardiovascular:     Rate and Rhythm: Normal rate and regular rhythm.     Pulses: Normal pulses.     Heart sounds: Normal heart sounds.  Pulmonary:     Effort: Pulmonary effort is normal.  Abdominal:     General: Bowel sounds are normal. There is distension (mild).     Tenderness: There is no abdominal tenderness.     Comments: Abdomen appears mildly distended without overlying skin changes.    Musculoskeletal:     Right lower leg: 3+ Pitting Edema present.     Left lower leg: 3+ Pitting Edema present.  Skin:    General: Skin is warm and dry.     Capillary Refill: Capillary refill takes less than 2 seconds.   Neurological:     Mental Status: He is alert. Mental status is at baseline.  Psychiatric:        Mood and Affect: Mood normal.        Behavior: Behavior normal.     ED Results / Procedures / Treatments   Labs (all labs ordered are listed, but only abnormal results are displayed) Labs Reviewed  CBC WITH DIFFERENTIAL/PLATELET - Abnormal; Notable for the following components:      Result Value   RBC 2.69 (*)    Hemoglobin 8.0 (*)    HCT 25.7 (*)    All other components within normal limits  COMPREHENSIVE METABOLIC PANEL - Abnormal; Notable for the following components:   CO2 21 (*)  Glucose, Bld 69 (*)    BUN 67 (*)    Creatinine, Ser 4.94 (*)    GFR, Estimated 10 (*)    All other components within normal limits  I-STAT CHEM 8, ED - Abnormal; Notable for the following components:   BUN 80 (*)    Creatinine, Ser 5.00 (*)    Glucose, Bld 66 (*)    Calcium, Ion 1.10 (*)    All other components within normal limits  BRAIN NATRIURETIC PEPTIDE  TROPONIN I (HIGH SENSITIVITY)    EKG EKG Interpretation Date/Time:  Thursday November 30 2022 14:44:06 EST Ventricular Rate:  75 PR Interval:  235 QRS Duration:  106 QT Interval:  428 QTC Calculation: 479 R Axis:   27  Text Interpretation: Sinus rhythm Prolonged PR interval Borderline T abnormalities, lateral leads Borderline prolonged QT interval No significant change since prior 8/19 Confirmed by Meridee Score [id number]) on 11/30/2022 2:46:15 PM  Radiology DG Chest Portable 1 View  Result Date: 11/30/2022 CLINICAL DATA:  Shortness of breath EXAM: PORTABLE CHEST 1 VIEW COMPARISON:  05/21/2019 acute abdomen series FINDINGS: Single AP view of the chest demonstrates midline trachea. Normal heart size for level of inspiration. Atherosclerosis in the transverse aorta. Marked left hemidiaphragm elevation is chronic. No pleural effusion or pneumothorax. Bibasilar volume loss with subsegmental atelectasis and/or scar. IMPRESSION: No acute  cardiopulmonary disease. Aortic Atherosclerosis (ICD10-I70.0). Electronically Signed   By: Jeronimo Greaves M.D.   On: 11/30/2022 15:32    Procedures Procedures    Medications Ordered in ED Medications - No data to display  ED Course/ Medical Decision Making/ A&P Clinical Course as of 11/30/22 2213  Thu Nov 30, 2022  2075 89 year old male with history of CKD and declining dialysis here with increased swelling to his legs.  He also feels his weight is up but does not think his breathing is too bad.  He has been taking his diuretics.  Lying flat satting 100% on room air in no distress.  Getting labs x-ray EKG.  Disposition per results of testing. [MB]    Clinical Course User Index [MB] Terrilee Files, MD                                 Medical Decision Making Amount and/or Complexity of Data Reviewed Labs: ordered. Radiology: ordered.  Risk Prescription drug management.   This patient presents to the ED with chief complaint(s) of edema, dyspnea on exertion with pertinent past medical history of CKD stage IV, hypertension.  The complaint involves an extensive differential diagnosis and also carries with it a high risk of complications and morbidity.    The differential diagnosis includes ESRD, CHF, metabolic derangement   The initial plan is to obtain labs, chest x-ray  Additional history obtained: Additional history obtained from family Records reviewed  previous labs - patient's Cr 3 months prior was 4.41 with calculated GFR of 12.  Patient's GFR has been below 15 since 9 months ago.  Initial Assessment:   Exam significant for overall well-appearing patient who is not in acute respiratory distress.  He is lying in bed comfortably with normal work of breathing.  Lungs clear to auscultation bilaterally.  Heart rate is normal in the 60s with regular rhythm.  Abdomen and bilateral lower extremities edematous concerning for anasarca.  3+ pitting edema to lower extremities.  Possible  mild edema to bilateral hands.    Independent ECG/labs interpretation:  The  following labs were independently interpreted:  CBC with anemia, appears to be chronic for patient and likely secondary to kidney disease.   Metabolic panel with worsening Cr, now 4.94 with a GFR of 10.  No major electrolyte disturbance.   Independent visualization and interpretation of imaging: I independently visualized the following imaging with scope of interpretation limited to determining acute life threatening conditions related to emergency care: chest x-ray, which revealed chronic left hemidiaphragm elevation, but no acute cardiopulmonary process.   Consultations obtained:   I spoke with Dr. Juel Burrow, nephrologist, via secure chat regarding patient's fluid retention and Lasix dosing.  He recommends 80 mg BID or TID.  Disposition:   Will increase patient's Lasix dose to BID.  Advised patient he may take TID over the next couple of days to help with increased swelling.  Advised patient to follow up with PCP and nephrology.  Upon discharge, patient continues to report feeling fine and is not experiencing any shortness of breath.    The patient has been appropriately medically screened and/or stabilized in the ED. I have low suspicion for any other emergent medical condition which would require further screening, evaluation or treatment in the ED or require inpatient management. At time of discharge the patient is hemodynamically stable and in no acute distress. I have discussed work-up results and diagnosis with patient and answered all questions. Patient is agreeable with discharge plan. We discussed strict return precautions for returning to the emergency department and they verbalized understanding.            Final Clinical Impression(s) / ED Diagnoses Final diagnoses:  Peripheral edema  ESRD (end stage renal disease) (HCC)    Rx / DC Orders ED Discharge Orders          Ordered    furosemide (LASIX)  80 MG tablet  2 times daily        11/30/22 1954              Barrie Lyme 11/30/22 2213    Terrilee Files, MD 12/01/22 Silva Bandy

## 2022-11-30 NOTE — ED Notes (Signed)
IV team at bedside 

## 2022-12-05 ENCOUNTER — Ambulatory Visit (INDEPENDENT_AMBULATORY_CARE_PROVIDER_SITE_OTHER): Payer: Medicare Other | Admitting: Internal Medicine

## 2022-12-05 ENCOUNTER — Encounter: Payer: Self-pay | Admitting: Internal Medicine

## 2022-12-05 VITALS — BP 106/68 | HR 83 | Temp 97.6°F | Ht 65.0 in | Wt 207.6 lb

## 2022-12-05 DIAGNOSIS — Z7189 Other specified counseling: Secondary | ICD-10-CM | POA: Diagnosis not present

## 2022-12-05 DIAGNOSIS — N186 End stage renal disease: Secondary | ICD-10-CM | POA: Diagnosis not present

## 2022-12-05 DIAGNOSIS — I1311 Hypertensive heart and chronic kidney disease without heart failure, with stage 5 chronic kidney disease, or end stage renal disease: Secondary | ICD-10-CM

## 2022-12-05 DIAGNOSIS — Z6834 Body mass index (BMI) 34.0-34.9, adult: Secondary | ICD-10-CM | POA: Diagnosis not present

## 2022-12-05 DIAGNOSIS — I129 Hypertensive chronic kidney disease with stage 1 through stage 4 chronic kidney disease, or unspecified chronic kidney disease: Secondary | ICD-10-CM | POA: Diagnosis not present

## 2022-12-05 DIAGNOSIS — R600 Localized edema: Secondary | ICD-10-CM

## 2022-12-05 DIAGNOSIS — I700 Atherosclerosis of aorta: Secondary | ICD-10-CM | POA: Diagnosis not present

## 2022-12-05 DIAGNOSIS — E6609 Other obesity due to excess calories: Secondary | ICD-10-CM

## 2022-12-05 DIAGNOSIS — E66811 Obesity, class 1: Secondary | ICD-10-CM | POA: Diagnosis not present

## 2022-12-05 NOTE — Patient Instructions (Signed)
Chronic Kidney Disease, Adult Chronic kidney disease (CKD) occurs when the kidneys are slowly and permanently damaged over a long period of time. The kidneys are a pair of organs that do many important jobs in the body, including: Removing waste and extra fluid from the blood to make urine. Making hormones that maintain the amount of fluid in tissues and blood vessels. Maintaining the right amount of fluids and chemicals in the body. A small amount of kidney damage may not cause problems, but a large amount of damage may make it hard or impossible for the kidneys to work right. Steps must be taken to slow kidney damage or to stop it from getting worse. If steps are not taken, the kidneys may stop working permanently (end-stage renal disease, or ESRD). Most of the time, CKD does not go away, but it can often be controlled. People who have CKD are usually able to live full lives. What are the causes? The most common causes of this condition are diabetes and high blood pressure (hypertension). Other causes include: Cardiovascular diseases. These affect the heart and blood vessels. Kidney diseases. These include: Glomerulonephritis, or inflammation of the tiny filters in the kidneys. Interstitial nephritis. This is swelling of the small tubes of the kidneys and of the surrounding structures. Polycystic kidney disease, in which clusters of fluid-filled sacs form within the kidneys. Renal vascular disease. This includes disorders that affect the arteries and veins of the kidneys. Diseases that affect the body's defense system (immune system). A problem with urine flow. This may be caused by: Kidney stones. Cancer. An enlarged prostate, in males. A kidney infection or urinary tract infection (UTI) that keeps coming back. Vasculitis. This is swelling or inflammation of the blood vessels. What increases the risk? Your chances of having kidney disease increase with age. The following factors may make  you more likely to develop this condition: A family history of kidney disease or kidney failure. Kidney failure means the kidneys can no longer work right. Certain genetic diseases. Taking medicines often that are damaging to the kidneys. Being around or being in contact with toxic substances. Obesity. A history of tobacco use. What are the signs or symptoms? Symptoms of this condition include: Feeling very tired (lethargic) and having less energy. Swelling, or edema, of the face, legs, ankles, or feet. Nausea or vomiting, or loss of appetite. Confusion or trouble concentrating. Muscle twitches and cramps, especially in the legs. Dry, itchy skin. A metallic taste in the mouth. Producing less urine, or producing more urine (especially at night). Shortness of breath. Trouble sleeping. CKD may also result in not having enough red blood cells or hemoglobin in the blood (anemia) or having weak bones (bone disease). Symptoms develop slowly and may not be obvious until the kidney damage becomes severe. It is possible to have kidney disease for years without having symptoms. How is this diagnosed? This condition may be diagnosed based on: Blood tests. Urine tests. Imaging tests, such as an ultrasound or a CT scan. A kidney biopsy. This involves removing a sample of kidney tissue to be looked at under a microscope. Results from these tests will help to determine how serious the CKD is. How is this treated? There is no cure for most cases of this condition, but treatment usually relieves symptoms and prevents or slows the worsening of the disease. Treatment may include: Diet changes, which may require you to avoid alcohol and foods that are high in salt, potassium, phosphorous, and protein. Medicines. These may:  Lower blood pressure. Control blood sugar (glucose). Relieve anemia. Relieve swelling. Protect your bones. Improve the balance of salts and minerals in your blood  (electrolytes). Dialysis, which is a type of treatment that removes toxic waste from the body. It may be needed if you have kidney failure. Managing any other conditions that are causing your CKD or making it worse. Follow these instructions at home: Medicines Take over-the-counter and prescription medicines only as told by your health care provider. The amount of some medicines that you take may need to be changed. Do not take any new medicines unless approved by your health care provider. Many medicines can make kidney damage worse. Do not take any vitamin and mineral supplements unless approved by your health care provider. Many nutritional supplements can make kidney damage worse. Lifestyle  Do not use any products that contain nicotine or tobacco, such as cigarettes, e-cigarettes, and chewing tobacco. If you need help quitting, ask your health care provider. If you drink alcohol: Limit how much you use to: 0-1 drink a day for women who are not pregnant. 0-2 drinks a day for men. Know how much alcohol is in your drink. In the [place] one drink equals one 12 oz bottle of beer (355 mL), one 5 oz glass of wine (148 mL), or one 1 oz glass of hard liquor (44 mL). Maintain a healthy weight. If you need help, ask your health care provider. General instructions  Follow instructions from your health care provider about eating or drinking restrictions, including any prescribed diet. Track your blood pressure at home. Report changes in your blood pressure as told. If you are being treated for diabetes, track your blood glucose levels as told. Start or continue an exercise plan. Exercise at least 30 minutes a day, 5 days a week. Keep your immunizations up to date as told. Keep all follow-up visits. This is important. Where to find more information American Association of Kidney Patients: [employer].is [employer]: www.kidney.org American Kid[employer]: FightingMatch.com.ee Life Options:  www.lifeoptions.org Kidney School: www.kidneyschool.org Contact a health care provider if: Your symptoms get worse. You develop new symptoms. Get help right away if: You develop symptoms of ESRD. These include: Headaches. Numbness in your hands or feet. Easy bruising. Frequent hiccups. Chest pain. Shortness of breath. Lack of menstrual periods, in women. You have a fever. You are producing less urine than usual. You have pain or bleeding when you urinate or when you have a bowel movement. These symptoms may represent a serious problem that is an emergency. Do not wait to see if the symptoms will go away. Get medical help right away. Call your local emergency services (911 in the [place] Do not drive yourself to the hospital. Summary Chronic kidney disease (CKD) occurs when the kidneys become damaged slowly over a long period of time. The most common causes of this condition are diabetes and high blood pressure (hypertension). There is no cure for most cases of CKD, but treatment usually relieves symptoms and prevents or slows the worsening of the disease. Treatment may include a combination of lifestyle changes, medicines, and dialysis. This information is not intended to replace advice given to you by your health care provider. Make sure you discuss any questions you have with your health care provider. Document Revised: 03/28/2019 Document Reviewed: 04/02/2019 Elsevier Patient Education  2024 [employer].

## 2022-12-05 NOTE — Progress Notes (Signed)
I,[name], CMA,acting as a Neurosurgeon for [name], MD.,have documented all relevant documentation on the behalf of [name], MD,as directed by  [name], MD while in the presence of [name], MD.  Subjective:  Patient ID: Justin Holloway , male    DOB: November 11, 1929 , 89 y.o.   MRN: [id number]  Chief Complaint  Patient presents with   Follow-up    HPI  Patient presents today for ED follow up. He presented to [facility] ER on 11/30/2022 for evaluation of worsening peripheral edema and shortness of breath.   ER workup revealed anasarca. CXR was without acute cardiopulmonary process, aortic atherosclerosis was noted. GFR 11.  Nephrology was consulted and it was recommended to increase furosemide to 80mg  twice daily. Advised to take tid for a day or two after discharge; however, he thought that was too much.     Since discharge, he states he feels much better.   His wife would like to know what is the plan for knowing what stage of renal failure he is in. He has an appointment with kidney specialist coming up on 12/21/22. His wife would also like to go over lab work from ED.      Past Medical History:  Diagnosis Date   Arthritis    CKD (chronic kidney disease), stage IV (HCC)    Diabetes mellitus without complication (HCC)    Glaucoma    "blurred vision", see Dr Nile Riggs yearly   Hyperlipidemia    Hypertension    TIA (transient ischemic attack)      Family History  Problem Relation Age of Onset   Cancer Mother 11       breast   COPD Father 57       Congestive heart failure     Current Outpatient Medications:    Accu-Chek Softclix Lancets lancets, Use as instructed to check blood sugars twice daily E11.69, Disp: 100 each, Rfl: 12   Ascorbic Acid (VITAMIN C) 1000 MG tablet, Take 1,000 mg by mouth daily. , Disp: , Rfl:    aspirin EC 81 MG tablet, Take 81 mg by mouth every evening., Disp: , Rfl:    blood glucose meter kit and supplies, Dispense based on  patient and insurance preference. Use up to four times daily as directed. (FOR ICD-10 E10.9, E11.9)., Disp: 1 each, Rfl: 12   Blood Glucose Monitoring Suppl (ONE TOUCH ULTRA 2) w/Device KIT, Use as directed to check blood sugars 2 times per day dx: e11.22, Disp: 1 kit, Rfl: 0   cholecalciferol (VITAMIN D) 1000 units tablet, Take 2,000 Units by mouth daily. , Disp: , Rfl:    doxycycline (MONODOX) 100 MG capsule, Take 1 capsule (100 mg total) by mouth 2 (two) times daily., Disp: 14 capsule, Rfl: 0   furosemide (LASIX) 80 MG tablet, Take 1 tablet (80 mg total) by mouth 2 (two) times daily., Disp: 60 tablet, Rfl: 0   glucose blood (ACCU-CHEK GUIDE) test strip, Use as instructed to check blood sugars twice daily E11.69, Disp: 100 each, Rfl: 12   glucose blood (ONETOUCH ULTRA) test strip, USE 1 STRIP TO TEST BLOOD SUGAR THREE TIMES DAILY, Disp: 300 strip, Rfl: 3   hydrALAZINE (APRESOLINE) 10 MG tablet, Take 10 mg by mouth 3 (three) times daily., Disp: , Rfl:    insulin degludec (TRESIBA) 200 UNIT/ML FlexTouch Pen, Inject 24 Units into the skin at bedtime. (Patient taking differently: Inject 12 Units into the skin at bedtime. 11/23/22 Per  Dr. Allyne Gee, decrease Evaristo Bury to 12 units daily at bedtime.), Disp: 3 mL, Rfl: 1   insulin degludec (TRESIBA) 200 UNIT/ML FlexTouch Pen, Inject 12 Units into the skin at bedtime., Disp: , Rfl:    Lancets (ONETOUCH DELICA PLUS LANCET33G) MISC, 100 each by Does not apply route in the morning, at noon, and at bedtime., Disp: 300 each, Rfl: 3   pravastatin (PRAVACHOL) 40 MG tablet, TAKE 1 TABLET(40 MG) BY MOUTH AT BEDTIME, Disp: 90 tablet, Rfl: 1   Semaglutide,0.25 or 0.5MG /DOS, (OZEMPIC, 0.25 OR 0.5 MG/DOSE,) 2 MG/1.5ML SOPN, Inject 0.5 mg into the skin once a week., Disp: 1.5 mL, Rfl: 1   tamsulosin (FLOMAX) 0.4 MG CAPS capsule, Take 1 capsule (0.4 mg total) by mouth daily., Disp: 90 capsule, Rfl: 1   COMBIGAN 0.2-0.5 % ophthalmic solution, Place 1 drop into both eyes every 12  (twelve) hours. (Patient not taking: Reported on 09/28/2022), Disp: , Rfl:    diclofenac Sodium (VOLTAREN) 1 % GEL, Apply 4 g topically 4 (four) times daily. (Patient not taking: Reported on 09/28/2022), Disp: 350 g, Rfl: 5   guaiFENesin (MUCINEX) 600 MG 12 hr tablet, Take 1 tablet (600 mg total) by mouth 2 (two) times daily as needed for up to 10 days for to loosen phlegm or cough (Take for 5 days then stop)., Disp: 20 tablet, Rfl: 0   Iron-FA-B Cmp-C-Biot-Probiotic (FUSION PLUS) CAPS, Take by mouth. 1 capsule per day (Patient not taking: Reported on 09/28/2022), Disp: , Rfl:    Multiple Vitamin (MULTIVITAMIN WITH MINERALS) TABS tablet, Take 1 tablet by mouth daily. (Patient not taking: Reported on 09/28/2022), Disp: , Rfl:    Allergies  Allergen Reactions   Gabapentin Itching     Review of Systems  Constitutional: Negative.   HENT: Negative.    Respiratory: Negative.    Cardiovascular: Negative.   Skin: Negative.   Allergic/Immunologic: Negative.   Neurological: Negative.   Hematological: Negative.      Today's Vitals   12/05/22 1233  BP: 106/68  Pulse: 83  Temp: 97.6 F (36.4 C)  SpO2: 98%  Weight: 207 lb 9.6 oz (94.2 kg)  Height: 5\' 5"  (1.651 m)   Body mass index is 34.55 kg/m.  Wt Readings from Last 3 Encounters:  12/05/22 207 lb 9.6 oz (94.2 kg)  11/30/22 205 lb (93 kg)  09/28/22 202 lb (91.6 kg)     Objective:  Physical Exam Vitals and nursing note reviewed.  Constitutional:      Appearance: Normal appearance. He is obese.  HENT:     Head: Normocephalic and atraumatic.  Eyes:     Extraocular Movements: Extraocular movements intact.  Cardiovascular:     Rate and Rhythm: Normal rate and regular rhythm.     Heart sounds: Normal heart sounds.  Pulmonary:     Effort: Pulmonary effort is normal.     Breath sounds: Normal breath sounds.  Abdominal:     General: Bowel sounds are normal.     Palpations: Abdomen is soft.  Musculoskeletal:     Cervical back: Normal  range of motion.     Right lower leg: Edema present.     Left lower leg: Edema present.     Comments: Ambulatory with walker  Skin:    General: Skin is warm.  Neurological:     General: No focal deficit present.     Mental Status: He is alert.  Psychiatric:        Mood and Affect: Mood normal.  Assessment And Plan:  Peripheral edema Assessment & Plan: ER records reviewed in detail. Meds reconciled and compared to meds prescribed in ER. Most recent Nephrology note was reviewed in detail as well.  His sx have improved with new furosemide dosing schedule. He reports feeling a lot better. Advised to limit his fluid intake to 32-48 ounces daily, he will discuss further with Nephrology.      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  Importance of dietary/medication compliance was discussed with the patient. He will continue with furosemide 80mg  in am and 1/2 tab in pm and hydralazine 10mg  tid.  Orders: -     BMP8+EGFR -     Amb Referral to Palliative Care  Atherosclerosis of aorta [facility]) Assessment & Plan: He is not on statin therapy.   Orders: -     Amb Referral to Palliative Care  ESRD (end stage renal disease) (HCC) Assessment & Plan: Most recent Nephrology note reviewed in full detail.  He is not yet anuric.  He is encouraged to continue with furosemide twice daily. He is encouraged to continue to follow sodium restricted diet.  He declines moving forward with dialysis. Again, he agrees to Palliative care consult.   Orders: -     Amb Referral to Palliative Care  Class 1 obesity due to excess calories with serious comorbidity and body mass index (BMI) of 34.0 to 34.9 in adult Assessment & Plan: BMI f/u not needed due to Palliative care.    Advanced care planning/counseling discussion Assessment & Plan: We discussed  goals of care, he and his wife would like to have a family meeting. He and his wife both agree to Palliative care consult.  At this time, he does not wish to pursue resuscitation efforts. I will defer further discussion to Palliative care. They will discuss details of MOST form with family as well.    He is encouraged to strive for BMI less than 30 to decrease cardiac risk. Advised to aim for at least 150 minutes of exercise per week.    Return for 4 month DM.  Patient was given opportunity to ask questions. Patient verbalized understanding of the plan and was able to repeat key elements of the plan. All questions were answered to their satisfaction.    I, [name], MD, have reviewed all documentation for this visit. The documentation on 12/10/22 for the exam, diagnosis, procedures, and orders are all accurate and complete.   IF YOU HAVE BEEN REFERRED TO A SPECIALIST, IT MAY TAKE 1-2 WEEKS TO SCHEDULE/PROCESS THE REFERRAL. IF YOU HAVE NOT HEARD FROM US/SPECIALIST IN TWO WEEKS, PLEASE GIVE [place] A CALL AT [phone].   THE PATIENT IS ENCOURAGED TO PRACTICE SOCIAL DISTANCING DUE TO THE COVID-19 PANDEMIC.

## 2022-12-06 LAB — BMP8+EGFR
BUN/Creatinine Ratio: 15 (ref 10–24)
BUN: 71 mg/dL — ABNORMAL HIGH (ref 10–36)
CO2: 21 mmol/L (ref 20–29)
Calcium: 8.9 mg/dL (ref 8.6–10.2)
Chloride: 102 mmol/L (ref 96–106)
Creatinine, Ser: 4.75 mg/dL — ABNORMAL HIGH (ref 0.76–1.27)
Glucose: 133 mg/dL — ABNORMAL HIGH (ref 70–99)
Potassium: 4 mmol/L (ref 3.5–5.2)
Sodium: 139 mmol/L (ref 134–144)
eGFR: 11 mL/min/{1.73_m2} — ABNORMAL LOW (ref >59)

## 2022-12-10 ENCOUNTER — Encounter: Payer: Self-pay | Admitting: Internal Medicine

## 2022-12-10 DIAGNOSIS — Z7189 Other specified counseling: Secondary | ICD-10-CM | POA: Insufficient documentation

## 2022-12-10 DIAGNOSIS — R600 Localized edema: Secondary | ICD-10-CM | POA: Insufficient documentation

## 2022-12-10 DIAGNOSIS — N186 End stage renal disease: Secondary | ICD-10-CM | POA: Insufficient documentation

## 2022-12-10 NOTE — Assessment & Plan Note (Signed)
We discussed goals of care, he and his wife would like to have a family meeting. He and his wife both agree to Palliative care consult.  At this time, he does not wish to pursue resuscitation efforts. I will defer further discussion to Palliative care. They will discuss details of MOST form with family as well.

## 2022-12-10 NOTE — Assessment & Plan Note (Signed)
BMI f/u not needed due to Palliative care.

## 2022-12-10 NOTE — Assessment & Plan Note (Signed)
Chronic, also followed by Nephrology.  Importance of dietary/medication compliance was discussed with the patient. He will continue with furosemide 80mg  in am and 1/2 tab in pm and hydralazine 10mg  tid.

## 2022-12-10 NOTE — Assessment & Plan Note (Signed)
ER records reviewed in detail. Meds reconciled and compared to meds prescribed in ER. Most recent Nephrology note was reviewed in detail as well.  His sx have improved with new furosemide dosing schedule. He reports feeling a lot better. Advised to limit his fluid intake to 32-48 ounces daily, he will discuss further with Nephrology.

## 2022-12-10 NOTE — Assessment & Plan Note (Addendum)
Most recent Nephrology note reviewed in full detail.  He is not yet anuric.  He is encouraged to continue with furosemide twice daily. He is encouraged to continue to follow sodium restricted diet.  He declines moving forward with dialysis. Again, he agrees to Palliative care consult.

## 2022-12-10 NOTE — Assessment & Plan Note (Signed)
He is not on statin therapy.

## 2022-12-12 ENCOUNTER — Ambulatory Visit: Payer: Self-pay

## 2022-12-12 NOTE — Patient Instructions (Signed)
Visit Information  Thank you for taking time to visit with me today. Please don't hesitate to contact me if I can be of assistance to you.   Following are the goals we discussed today:   Goals Addressed             This Visit's Progress    To manage CKD stage V, and Nutrition       Care Coordination Interventions: Assessed the Patient understanding of chronic kidney disease    Evaluation of current treatment plan related to chronic kidney disease self management and patient's adherence to plan as established by provider  Review of patient status, including review of consultant's reports, relevant laboratory and other test results, and medications completed Provided education on kidney disease progression    Support coping and stress management by recognizing current strategies and assist in developing new strategies such as mindfulness, journaling, relaxation techniques, problem-solving    Active listening / Reflection utilized  Emotional Support Provided  Reviewed and discussed PCP referral for Palliative Care Determined patient/wife spoke with Palliative Care nurse, patient would like to be evaluated for Hospice Care in order to receive weekly nurse visits for comfort care as he enters symptomatic stage of end stage renal disease Discussed next scheduled Nephrology appointment is scheduled for 12/21/22 and patient would like to have appointment moved up if possible Placed outbound call to [facility] LPN with Marcell Anger, discussed patient would like to be evaluated for Hospice Care and would like his appointment with Dr. Ronalee Belts moved up to an earlier appointment if possible Discussed Geraldine Contras will stop by [facility] in person while in the area to request an earlier appointment Discussed Geraldine Contras will ask the Hospice nurse to evaluate patient for Hospice Care per patient's request Sent in basket message to Dr. Allyne Gee with patient status update Patient will receive nurse evaluation for Hospice  Care through [facility] for ESRD Patient will keep his scheduled appointment with Dr. Ronalee Belts, Nephrologist for evaluation of ESRD  Patient will continue to work with nurse care coordinator for chronic disease management and care coordination needs          Our next appointment is by telephone on 12/14/22 at 10:00 AM  Please call the care guide team at [phone] if you need to cancel or reschedule your appointment.   If you are experiencing a Mental Health or Behavioral Health Crisis or need someone to talk to, please call 1-800-273-TALK (toll free, 24 hour hotline)  Patient verbalizes understanding of instructions and care plan provided today and agrees to view in MyChart. Active MyChart status and patient understanding of how to access instructions and care plan via MyChart confirmed with patient.     Delsa Sale RN BSN CCM [place]  [facility], [facility] Health Nurse Care Coordinator  Direct Dial: [phone] Website: [email or web address]

## 2022-12-12 NOTE — Patient Outreach (Signed)
  Care Coordination   Follow Up Visit Note   12/12/2022 Name: REGORY CLUNEY MRN: [id number] DOB: 1929-06-22  Harrel Lemon Jankiewicz is a 89 y.o. year old male who sees [name], MD for primary care. I spoke with  [name] by phone today.  What matters to the patients health and wellness today?  Patient would like to be evaluated for Hospice Care.     Goals Addressed             This Visit's Progress    To manage CKD stage V, and Nutrition       Care Coordination Interventions: Assessed the Patient understanding of chronic kidney disease    Evaluation of current treatment plan related to chronic kidney disease self management and patient's adherence to plan as established by provider  Review of patient status, including review of consultant's reports, relevant laboratory and other test results, and medications completed Provided education on kidney disease progression    Support coping and stress management by recognizing current strategies and assist in developing new strategies such as mindfulness, journaling, relaxation techniques, problem-solving    Active listening / Reflection utilized  Emotional Support Provided  Reviewed and discussed PCP referral for Palliative Care Determined patient/wife spoke with Palliative Care nurse, patient would like to be evaluated for Hospice Care in order to receive weekly nurse visits for comfort care as he enters symptomatic stage of end stage renal disease Discussed next scheduled Nephrology appointment is scheduled for 12/21/22 and patient would like to have appointment moved up if possible Placed outbound call to [facility] LPN with Marcell Anger, discussed patient would like to be evaluated for Hospice Care and would like his appointment with Dr. Ronalee Belts moved up to an earlier appointment if possible Discussed Geraldine Contras will stop by [facility] in person while in the area to request an earlier appointment Discussed Geraldine Contras will ask the Hospice  nurse to evaluate patient for Hospice Care per patient's request Sent in basket message to Dr. Allyne Gee with patient status update Patient will receive nurse evaluation for Hospice Care through [facility] for ESRD Patient will keep his scheduled appointment with Dr. Ronalee Belts, Nephrologist for evaluation of ESRD  Patient will continue to work with nurse care coordinator for chronic disease management and care coordination needs      Interventions Today    Flowsheet Row Most Recent Value  Chronic Disease   Chronic disease during today's visit Chronic Kidney Disease/End Stage Renal Disease (ESRD)  General Interventions   General Interventions Discussed/Reviewed General Interventions Discussed, General Interventions Reviewed, Doctor Visits, Labs, Communication with  Doctor Visits Discussed/Reviewed Doctor Visits Discussed, Doctor Visits Reviewed, PCP, Specialist  Communication with PCP/Specialists  [Dr. Zella Richer w/[facility]]  Education Interventions   Education Provided Provided Education  Provided Verbal Education On Medication, When to see the doctor  Labs Reviewed Kidney Function  Advanced Directive Interventions   Advanced Directives Discussed/Reviewed End of Life  End of Life Hospice          SDOH assessments and interventions completed:  No     Care Coordination Interventions:  Yes, provided   Follow up plan: Follow up call scheduled for 12/14/22 @10 :00 AM    Encounter Outcome:  Patient Visit Completed

## 2022-12-14 ENCOUNTER — Other Ambulatory Visit: Payer: Self-pay | Admitting: Internal Medicine

## 2022-12-14 ENCOUNTER — Ambulatory Visit: Payer: Self-pay

## 2022-12-14 DIAGNOSIS — I700 Atherosclerosis of aorta: Secondary | ICD-10-CM

## 2022-12-14 DIAGNOSIS — N186 End stage renal disease: Secondary | ICD-10-CM

## 2022-12-14 DIAGNOSIS — I1311 Hypertensive heart and chronic kidney disease without heart failure, with stage 5 chronic kidney disease, or end stage renal disease: Secondary | ICD-10-CM

## 2022-12-14 DIAGNOSIS — E1122 Type 2 diabetes mellitus with diabetic chronic kidney disease: Secondary | ICD-10-CM

## 2022-12-14 NOTE — Patient Outreach (Signed)
  Care Coordination   Follow Up Visit Note   12/14/2022 Name: Justin Holloway MRN: [id number] DOB: 07/21/1929  Justin Holloway is a 89 y.o. year old male who sees [name], MD for primary care. I spoke with  [name] by phone today.  What matters to the patients health and wellness today?  Patient would like to receive Hospice Care as he approaches end stages of renal disease.     Goals Addressed             This Visit's Progress    To manage CKD stage V, and Nutrition   On track    Care Coordination Interventions: Assessed the Patient understanding of chronic kidney disease    Evaluation of current treatment plan related to chronic kidney disease self management and patient's adherence to plan as established by provider  Completed outbound call with [facility] LPN with [facility] Determined Justin Holloway spoke with the patient and his wife to confirm they would like Hospice Care verses Palliative Care Determined PCP will need to send a Hospice referral, sent in basket message to Dr. Allyne Gee with request for referral  Spoke with wife Justin Holloway, she verbalizes understanding regarding PCP will send Hospice referral and next steps Scheduled nurse care coordination follow up call for 12/18/22 @10 :00 AM Patient will receive nurse evaluation for Hospice Care through [facility] for ESRD Patient will continue to work with nurse care coordinator for chronic disease management and care coordination needs      Interventions Today    Flowsheet Row Most Recent Value  Chronic Disease   Chronic disease during today's visit Chronic Kidney Disease/End Stage Renal Disease (ESRD)  General Interventions   General Interventions Discussed/Reviewed General Interventions Discussed, General Interventions Reviewed, Communication with  Communication with PCP/Specialists, RN  [Dr. Zella Richer LPN [facility]]  Education Interventions   Education Provided Provided Education  Advanced Directive  Interventions   Advanced Directives Discussed/Reviewed End of Life, Advanced Care Planning  End of Life Hospice          SDOH assessments and interventions completed:  No     Care Coordination Interventions:  Yes, provided   Follow up plan: Follow up call scheduled for 12/18/22 @10 :00 AM    Encounter Outcome:  Patient Visit Completed

## 2022-12-14 NOTE — Patient Instructions (Signed)
Visit Information  Thank you for taking time to visit with me today. Please don't hesitate to contact me if I can be of assistance to you.   Following are the goals we discussed today:   Goals Addressed             This Visit's Progress    To manage CKD stage V, and Nutrition   On track    Care Coordination Interventions: Assessed the Patient understanding of chronic kidney disease    Evaluation of current treatment plan related to chronic kidney disease self management and patient's adherence to plan as established by provider  Completed outbound call with [name] LPN with [facility] Determined Justin Holloway spoke with the patient and his wife to confirm they would like Hospice Care verses Palliative Care Determined PCP will need to send a Hospice referral, sent in basket message to Dr. Allyne Gee with request for referral  Spoke with wife Justin Holloway, she verbalizes understanding regarding PCP will send Hospice referral and next steps Scheduled nurse care coordination follow up call for 12/18/22 @10 :00 AM Patient will receive nurse evaluation for Hospice Care through [facility] for ESRD Patient will continue to work with nurse care coordinator for chronic disease management and care coordination needs          Our next appointment is by telephone on 12/18/22 at 10:00 AM  Please call the care guide team at [phone] if you need to cancel or reschedule your appointment.   If you are experiencing a Mental Health or Behavioral Health Crisis or need someone to talk to, please call 1-800-273-TALK (toll free, 24 hour hotline)  Patient verbalizes understanding of instructions and care plan provided today and agrees to view in MyChart. Active MyChart status and patient understanding of how to access instructions and care plan via MyChart confirmed with patient.     Delsa Sale RN BSN CCM [place]  [facility], [facility] Health Nurse Care Coordinator  Direct Dial:  [phone] Website: [email or web address]

## 2022-12-18 ENCOUNTER — Ambulatory Visit: Payer: Self-pay

## 2022-12-18 NOTE — Patient Instructions (Signed)
Visit Information  Thank you for taking time to visit with me today. Please don't hesitate to contact me if I can be of assistance to you.   Following are the goals we discussed today:   Goals Addressed             This Visit's Progress    To manage CKD stage V, and Nutrition   On track    Care Coordination Interventions: Assessed the Patient understanding of end stage renal disease   Evaluation of current treatment plan related to ESRD self management and patient's adherence to plan as established by provider  Determined patient is now under the care of Hospice with [facility]  Discussed with patient and wife, he remains stable however continues to have persistent dizziness Discussed patient may keep his next scheduled PCP follow up with Dr. Allyne Gee scheduled for 01/16/23 @2 :40 PM Patient will continue to receive Hospice Care through [facility]         Our next appointment is by telephone on 01/18/23 at 11:00 AM  Please call the care guide team at [phone] if you need to cancel or reschedule your appointment.   If you are experiencing a Mental Health or Behavioral Health Crisis or need someone to talk to, please call 1-800-273-TALK (toll free, 24 hour hotline)  Patient verbalizes understanding of instructions and care plan provided today and agrees to view in MyChart. Active MyChart status and patient understanding of how to access instructions and care plan via MyChart confirmed with patient.     Delsa Sale RN BSN CCM [place]  [facility], [facility] Health Nurse Care Coordinator  Direct Dial: [phone] Website: [email or web address]

## 2022-12-18 NOTE — Patient Outreach (Signed)
  Care Coordination   Follow Up Visit Note   12/18/2022 Name: Justin Holloway MRN: [id number] DOB: 1929/10/05  Justin Holloway is a 89 y.o. year old male who sees [name], MD for primary care. I spoke with  [name] by phone today.  What matters to the patients health and wellness today?  Patient will continue to receive Hospice Care for ESRD.     Goals Addressed             This Visit's Progress    To manage CKD stage V, and Nutrition   On track    Care Coordination Interventions: Assessed the Patient understanding of end stage renal disease   Evaluation of current treatment plan related to ESRD self management and patient's adherence to plan as established by provider  Determined patient is now under the care of Hospice with [facility]  Discussed with patient and wife, he remains stable however continues to have persistent dizziness Discussed patient may keep his next scheduled PCP follow up with Dr. Allyne Gee scheduled for 01/16/23 @2 :40 PM Patient will continue to receive Hospice Care through [facility]     Interventions Today    Flowsheet Row Most Recent Value  Chronic Disease   Chronic disease during today's visit Chronic Kidney Disease/End Stage Renal Disease (ESRD)  General Interventions   General Interventions Discussed/Reviewed General Interventions Discussed, General Interventions Reviewed, Level of Care, Doctor Visits, Communication with  Doctor Visits Discussed/Reviewed Doctor Visits Discussed, Doctor Visits Reviewed, Specialist, PCP  Communication with PCP/Specialists  [Dr. Melina Schools Sanders]  Education Interventions   Education Provided Provided Education  Provided Verbal Education On Mental Health/Coping with Illness  Mental Health Interventions   Mental Health Discussed/Reviewed Mental Health Discussed, Mental Health Reviewed, Coping Strategies  Advanced Directive Interventions   Advanced Directives Discussed/Reviewed End  of Life  End of Life Hospice          SDOH assessments and interventions completed:  No     Care Coordination Interventions:  Yes, provided   Follow up plan: Follow up call scheduled for 01/18/23 @11 :00 AM    Encounter Outcome:  Patient Visit Completed

## 2022-12-22 ENCOUNTER — Other Ambulatory Visit: Payer: Self-pay | Admitting: Internal Medicine

## 2022-12-26 ENCOUNTER — Encounter: Payer: Self-pay | Admitting: Internal Medicine

## 2023-01-16 ENCOUNTER — Ambulatory Visit: Payer: Medicare Other | Admitting: Internal Medicine

## 2023-01-18 ENCOUNTER — Ambulatory Visit: Payer: Self-pay

## 2023-01-18 NOTE — Patient Outreach (Signed)
  Care Coordination   Follow Up Visit Note   01/18/2023 Name: Justin Holloway MRN: [id number] DOB: April 19, 1929  Justin Holloway [name] is a 89 y.o. year old male who sees Jarold Medici, MD for primary care. I spoke with wife Justin Holloway by phone today.  What matters to the patients health and wellness today?  Patient would like to remain comfortable as he approaches End of Life.     Goals Addressed               This Visit's Progress     Patient Stated     COMPLETED: I would like to restart Gabapentin  for my Neuropathy (pt-stated)        Care Coordination Interventions: Patient is currently under the care of Hospice      Other     COMPLETED: To continue to monitor blood sugars at home        Care Coordination Interventions: Evaluation of current treatment plan related to type 2 diabetes and patient's adherence to plan as established by provider Patient is currently under the care of Hospice      To manage CKD stage V, and Nutrition   On track     Care Coordination Interventions: Evaluation of current treatment plan related to ESRD self management and patient's adherence to plan as established by provider  Spoke with wife, Justin Holloway who advised patient remains be receive Hospice Care and is stable at this time  Discussed patient/wife cancelled recent PCP visit due to receiving Hospice Care Instructed wife to contact patient's Hospice nurse to report any change in his condition and or other concerns that may arise and she verbalizes understanding     Interventions Today    Flowsheet Row Most Recent Value  Chronic Disease   Chronic disease during today's visit Chronic Kidney Disease/End Stage Renal Disease (ESRD)  General Interventions   General Interventions Discussed/Reviewed General Interventions Discussed, General Interventions Reviewed, Doctor Visits, Communication with  Communication with PCP/Specialists  [Dr. Sanders]  Education Interventions   Education  Provided Provided Education  Advanced Directive Interventions   Advanced Directives Discussed/Reviewed End of Life  End of Life Hospice          SDOH assessments and interventions completed:  Yes  SDOH Interventions Today    Flowsheet Row Most Recent Value  SDOH Interventions   Food Insecurity Interventions Intervention Not Indicated  Housing Interventions Intervention Not Indicated  Transportation Interventions Intervention Not Indicated  Utilities Interventions Intervention Not Indicated        Care Coordination Interventions:  Yes, provided   Follow up plan: Follow up call scheduled for 02/15/23 @11 :00 AM    Encounter Outcome:  Patient Visit Completed

## 2023-01-18 NOTE — Patient Instructions (Signed)
 Visit Information  Thank you for taking time to visit with me today. Please don't hesitate to contact me if I can be of assistance to you.   Following are the goals we discussed today:   Goals Addressed               This Visit's Progress     Patient Stated     COMPLETED: I would like to restart Gabapentin  for my Neuropathy (pt-stated)        Care Coordination Interventions: Patient is currently under the care of Hospice      Other     COMPLETED: To continue to monitor blood sugars at home        Care Coordination Interventions: Evaluation of current treatment plan related to type 2 diabetes and patient's adherence to plan as established by provider Patient is currently under the care of Hospice       To manage CKD stage V, and Nutrition   On track     Care Coordination Interventions: Evaluation of current treatment plan related to ESRD self management and patient's adherence to plan as established by provider  Spoke with wife, Mrs. Bonaventure who advised patient remains be receive Hospice Care and is stable at this time  Discussed patient/wife cancelled recent PCP visit due to receiving Hospice Care Instructed wife to contact patient's Hospice nurse to report any change in his condition and or other concerns that may arise and she verbalizes understanding         Our next appointment is by telephone on 02/15/23 at 11:00 AM  Please call the care guide team at [phone] if you need to cancel or reschedule your appointment.   If you are experiencing a Mental Health or Behavioral Health Crisis or need someone to talk to, please call 1-800-273-TALK (toll free, 24 hour hotline)  Patient verbalizes understanding of instructions and care plan provided today and agrees to view in MyChart. Active MyChart status and patient understanding of how to access instructions and care plan via MyChart confirmed with patient.     Clayborne Ly RN BSN CCM [place]  [facility], [facility] Health Nurse Care Coordinator  Direct Dial: [phone] Website: [email or web address]

## 2023-03-06 ENCOUNTER — Ambulatory Visit: Payer: Self-pay

## 2023-03-06 NOTE — Patient Instructions (Signed)
 Visit Information  Thank you for taking time to visit with me today. Please don't hesitate to contact me if I can be of assistance to you.   Following are the goals we discussed today:   Goals Addressed             This Visit's Progress    To manage CKD stage V, and Nutrition       Care Coordination Interventions: Evaluation of current treatment plan related to ESRD self management and patient's adherence to plan as established by provider  Spoke with wife, Mrs. Scow who advised patient remains be receive Hospice Care and is stable at this time  Discussed patient/wife cancelled recent PCP visit due to receiving Hospice Care Instructed wife to contact patient's Hospice nurse to report any change in his condition and or other concerns that may arise and she verbalizes understanding  Discussed Hospice will re-evaluate patient's eligibility for service after 9 weeks of service which was determined to be March 21, 2023 Discussed plans with patient for ongoing care coordination follow up and provided patient/wife with direct contact information for nurse care coordinator Scheduled next nurse follow up call for 03/27/23 @12  PM        Our next appointment is by telephone on 03/27/23 at 12:00 PM  Please call the care guide team at [phone] if you need to cancel or reschedule your appointment.   If you are experiencing a Mental Health or Behavioral Health Crisis or need someone to talk to, please call 1-800-273-TALK (toll free, 24 hour hotline)  Patient verbalizes understanding of instructions and care plan provided today and agrees to view in MyChart. Active MyChart status and patient understanding of how to access instructions and care plan via MyChart confirmed with patient.     Delsa Sale RN BSN CCM [place]  [facility], [facility] Health Nurse Care Coordinator  Direct Dial: [phone] Website: [email or web address]  \

## 2023-03-06 NOTE — Patient Outreach (Signed)
 Care Coordination   Follow Up Visit Note   03/06/2023 Name: Justin Holloway MRN: [id number] DOB: 1929-10-28  Justin Holloway is a 89 y.o. year old male who sees [name], MD for primary care. I spoke with Justin Holloway and wife Justin Holloway by phone today.  What matters to the patients health and wellness today?  Patient would like to continue to Hospice Care to help with End of Life secondary to ESRD.     Goals Addressed             This Visit's Progress    To manage CKD stage V, and Nutrition       Care Coordination Interventions: Evaluation of current treatment plan related to ESRD self management and patient's adherence to plan as established by provider  Spoke with wife, Justin Holloway who advised patient remains be receive Hospice Care and is stable at this time  Discussed patient/wife cancelled recent PCP visit due to receiving Hospice Care Instructed wife to contact patient's Hospice nurse to report any change in his condition and or other concerns that may arise and she verbalizes understanding  Discussed Hospice will re-evaluate patient's eligibility for service after 9 weeks of service which was determined to be March 21, 2023 Discussed plans with patient for ongoing care coordination follow up and provided patient/wife with direct contact information for nurse care coordinator Scheduled next nurse follow up call for 03/27/23 @12  PM    Interventions Today    Flowsheet Row Most Recent Value  Chronic Disease   Chronic disease during today's visit Chronic Kidney Disease/End Stage Renal Disease (ESRD)  General Interventions   General Interventions Discussed/Reviewed General Interventions Discussed, General Interventions Reviewed, Doctor Visits, Level of Care  Doctor Visits Discussed/Reviewed Doctor Visits Discussed  Education Interventions   Education Provided Provided Education  Provided Verbal Education On When to see the doctor  Advanced Directive  Interventions   Advanced Directives Discussed/Reviewed End of Life  End of Life Hospice          SDOH assessments and interventions completed:  No     Care Coordination Interventions:  Yes, provided   Follow up plan: Follow up call scheduled for 03/27/23 @12  PM    Encounter Outcome:  Patient Visit Completed

## 2023-03-27 ENCOUNTER — Ambulatory Visit: Payer: Self-pay

## 2023-03-27 NOTE — Patient Instructions (Signed)
 Visit Information  Thank you for taking time to visit with me today. Please don't hesitate to contact me if I can be of assistance to you.   Following are the goals we discussed today:   Goals Addressed             This Visit's Progress    To manage CKD stage V, and Nutrition       Care Coordination Interventions: Evaluation of current treatment plan related to ESRD self management and patient's adherence to plan as established by provider  Spoke with wife, Mrs. Dubree who advised patient remains be receive Hospice Care and is stable at this time  Educated Mrs. Massmann regarding signs/symptoms suggestive of uremica, hyperkalemia, pulmonary edema and other symptoms suggestive of ESRD Instructed wife to contact patient's Hospice nurse to report any change in his condition and or other concerns that may arise and she verbalizes understanding  Discussed Hospice will complete a nine week re-evaluation during upcoming scheduled visit         Our next appointment is by telephone on 04/24/23 at 12:30 PM  Please call the care guide team at [phone] if you need to cancel or reschedule your appointment.   If you are experiencing a Mental Health or Behavioral Health Crisis or need someone to talk to, please call 1-800-273-TALK (toll free, 24 hour hotline)  Patient verbalizes understanding of instructions and care plan provided today and agrees to view in MyChart. Active MyChart status and patient understanding of how to access instructions and care plan via MyChart confirmed with patient.     Delsa Sale RN BSN CCM [place]  [facility], [facility] Health Nurse Care Coordinator  Direct Dial: [phone] Website: [email or web address]

## 2023-03-27 NOTE — Patient Outreach (Signed)
 Care Coordination   Follow Up Visit Note   03/27/2023 Name: Justin Holloway MRN: [id number] DOB: Oct 26, 1929  Justin Holloway is a 89 y.o. year old male who sees [name], MD for primary care. I spoke with wife Justin Holloway by phone today.  What matters to the patients health and wellness today?  Patient would like to remain comfortable until he reaches end of life.     Goals Addressed             This Visit's Progress    To manage CKD stage V, and Nutrition       Care Coordination Interventions: Evaluation of current treatment plan related to ESRD self management and patient's adherence to plan as established by provider  Spoke with wife, Justin Holloway who advised patient remains be receive Hospice Care and is stable at this time  Educated Mrs. Burkel regarding signs/symptoms suggestive of uremica, hyperkalemia, pulmonary edema and other symptoms suggestive of ESRD Instructed wife to contact patient's Hospice nurse to report any change in his condition and or other concerns that may arise and she verbalizes understanding  Discussed Hospice will complete a nine week re-evaluation during upcoming scheduled visit     Interventions Today    Flowsheet Row Most Recent Value  Chronic Disease   Chronic disease during today's visit Chronic Kidney Disease/End Stage Renal Disease (ESRD)  General Interventions   General Interventions Discussed/Reviewed General Interventions Discussed, General Interventions Reviewed  Education Interventions   Education Provided Provided Education  Advanced Directive Interventions   Advanced Directives Discussed/Reviewed End of Life  End of Life Hospice          SDOH assessments and interventions completed:  No     Care Coordination Interventions:  Yes, provided   Follow up plan: Follow up call scheduled for 04/24/23 @12 :30 PM    Encounter Outcome:  Patient Visit Completed

## 2023-05-02 ENCOUNTER — Other Ambulatory Visit: Payer: Self-pay

## 2023-05-02 NOTE — Patient Instructions (Signed)
 Visit Information  Thank you for taking time to visit with me today.   Following is a copy of your care plan:   Goals Addressed             This Visit's Progress    COMPLETED: To manage CKD stage V, and Nutrition       Care Coordination Interventions: Evaluation of current treatment plan related to ESRD self management and patient's adherence to plan as established by provider  Spoke with wife, Mrs. Farrior who advised patient remains be receive Hospice Care and is stable at this time  Instructed wife to contact patient's Hospice nurse to report any change in his condition and or other concerns that may arise and she verbalizes understanding  Informed wife of nurse care management case closure due to patient receiving Hospice Care and she verbalizes understanding         Please call 1-800-273-TALK (toll free, 24 hour hotline) if you are experiencing a Mental Health or Behavioral Health Crisis or need someone to talk to.  Patient verbalizes understanding of instructions and care plan provided today and agrees to view in MyChart. Active MyChart status and patient understanding of how to access instructions and care plan via MyChart confirmed with patient.     Louanne Roussel RN BSN CCM [place]  [facility], [facility] Health Nurse Care Coordinator  Direct Dial: [phone] Website: [email or web address]

## 2023-05-02 NOTE — Patient Outreach (Addendum)
 Complex Care Management   Visit Note  05/02/2023  Name:  Justin Holloway MRN: [id number] DOB: [birth year]  Situation: Referral received for Complex Care Management related to ESRD. I obtained verbal consent from Patient.  Visit completed with wife Justin Holloway  on the phone.  Background:   Past Medical History:  Diagnosis Date   Arthritis    CKD (chronic kidney disease), stage IV (HCC)    Diabetes mellitus without complication (HCC)    Glaucoma    "blurred vision", see Dr Gennie Kicks yearly   Hyperlipidemia    Hypertension    TIA (transient ischemic attack)     Assessment: Patient Reported Symptoms:  Cognitive Cognitive Status: Unable to Assess      Neurological Neurological Review of Symptoms: Not assessed    HEENT HEENT Symptoms Reported: Not assessed      Cardiovascular Cardiovascular Symptoms Reported: Not assessed    Respiratory Respiratory Symptoms Reported: Not assesed    Endocrine Patient reports the following symptoms related to hypoglycemia or hyperglycemia : Not assessed    Gastrointestinal Gastrointestinal Symptoms Reported: Not assessed      Genitourinary Genitourinary Conditions: Chronic kidney disease Genitourinary Self-Management Outcome: 3 (uncertain) Genitourinary Comment: Patient is under the care of Hospice shortness of breath, weakness related to ESRD   Integumentary Integumentary Symptoms Reported: Not assessed    Musculoskeletal Musculoskelatal Symptoms Reviewed: Not assessed        Psychosocial Psychosocial Symptoms Reported: Not assessed     Quality of Family Relationships: supportive      12/05/2022   12:35 PM  Depression screen PHQ 2/9  Decreased Interest 0  Down, Depressed, Hopeless 0  PHQ - 2 Score 0  Altered sleeping 0  Tired, decreased energy 0  Change in appetite 0  Feeling bad or failure about yourself  0  Trouble concentrating 0  Moving slowly or fidgety/restless 0  Suicidal thoughts 0  PHQ-9 Score 0  Difficult  doing work/chores Not difficult at all    There were no vitals filed for this visit.  Medications Reviewed Today   Medications were not reviewed in this encounter     Recommendation:   Specialty provider follow-up [facility]   Follow Up Plan:   Closing From:  Complex Care Management  Louanne Roussel RN BSN CCM [facility]  [facility], [facility] Health Nurse Care Coordinator  Direct Dial: [phone] Website: [email or web address]

## 2023-06-25 ENCOUNTER — Other Ambulatory Visit: Payer: Self-pay | Admitting: Internal Medicine

## 2023-10-10 ENCOUNTER — Ambulatory Visit: Payer: Medicare Other | Admitting: Internal Medicine

## 2024-01-10 DEATH — deceased
